# Patient Record
Sex: Male | Born: 1965
Health system: Southern US, Community
[De-identification: ages and names within clinical notes are randomized; demographics above are authoritative.]

## PROBLEM LIST (undated history)

## (undated) DIAGNOSIS — N400 Enlarged prostate without lower urinary tract symptoms: Secondary | ICD-10-CM

## (undated) DIAGNOSIS — E119 Type 2 diabetes mellitus without complications: Secondary | ICD-10-CM

## (undated) DIAGNOSIS — E785 Hyperlipidemia, unspecified: Secondary | ICD-10-CM

## (undated) DIAGNOSIS — I10 Essential (primary) hypertension: Secondary | ICD-10-CM

## (undated) DIAGNOSIS — I639 Cerebral infarction, unspecified: Secondary | ICD-10-CM

---

## 1997-09-27 ENCOUNTER — Emergency Department (HOSPITAL_COMMUNITY): Admission: EM | Admit: 1997-09-27 | Discharge: 1997-09-27 | Payer: Self-pay | Admitting: Emergency Medicine

## 2003-08-25 ENCOUNTER — Emergency Department (HOSPITAL_COMMUNITY): Admission: EM | Admit: 2003-08-25 | Discharge: 2003-08-26 | Payer: Self-pay | Admitting: Emergency Medicine

## 2003-09-14 ENCOUNTER — Emergency Department (HOSPITAL_COMMUNITY): Admission: EM | Admit: 2003-09-14 | Discharge: 2003-09-14 | Payer: Self-pay | Admitting: Emergency Medicine

## 2006-02-14 ENCOUNTER — Ambulatory Visit: Payer: Self-pay | Admitting: Nurse Practitioner

## 2006-02-17 ENCOUNTER — Ambulatory Visit: Payer: Self-pay | Admitting: *Deleted

## 2007-01-24 ENCOUNTER — Encounter (INDEPENDENT_AMBULATORY_CARE_PROVIDER_SITE_OTHER): Payer: Self-pay | Admitting: *Deleted

## 2008-11-03 ENCOUNTER — Observation Stay (HOSPITAL_COMMUNITY): Admission: EM | Admit: 2008-11-03 | Discharge: 2008-11-03 | Payer: Self-pay | Admitting: Emergency Medicine

## 2008-11-09 ENCOUNTER — Inpatient Hospital Stay (HOSPITAL_COMMUNITY): Admission: EM | Admit: 2008-11-09 | Discharge: 2008-11-14 | Payer: Self-pay | Admitting: Emergency Medicine

## 2008-12-05 ENCOUNTER — Ambulatory Visit: Payer: Self-pay | Admitting: Internal Medicine

## 2009-06-18 ENCOUNTER — Observation Stay (HOSPITAL_COMMUNITY): Admission: EM | Admit: 2009-06-18 | Discharge: 2009-06-18 | Payer: Self-pay | Admitting: Emergency Medicine

## 2009-06-20 ENCOUNTER — Emergency Department (HOSPITAL_COMMUNITY): Admission: EM | Admit: 2009-06-20 | Discharge: 2009-06-20 | Payer: Self-pay | Admitting: Emergency Medicine

## 2010-05-15 ENCOUNTER — Inpatient Hospital Stay (HOSPITAL_COMMUNITY)
Admission: EM | Admit: 2010-05-15 | Discharge: 2010-05-17 | Payer: Self-pay | Source: Home / Self Care | Attending: Internal Medicine | Admitting: Internal Medicine

## 2010-05-15 IMAGING — CR DG CHEST 2V
2 series · 2 of 2 positions shown · non-contrast
Comparison: None

CLINICAL DATA: high blood sugar

CHEST - 2 VIEW

[w chest pa]
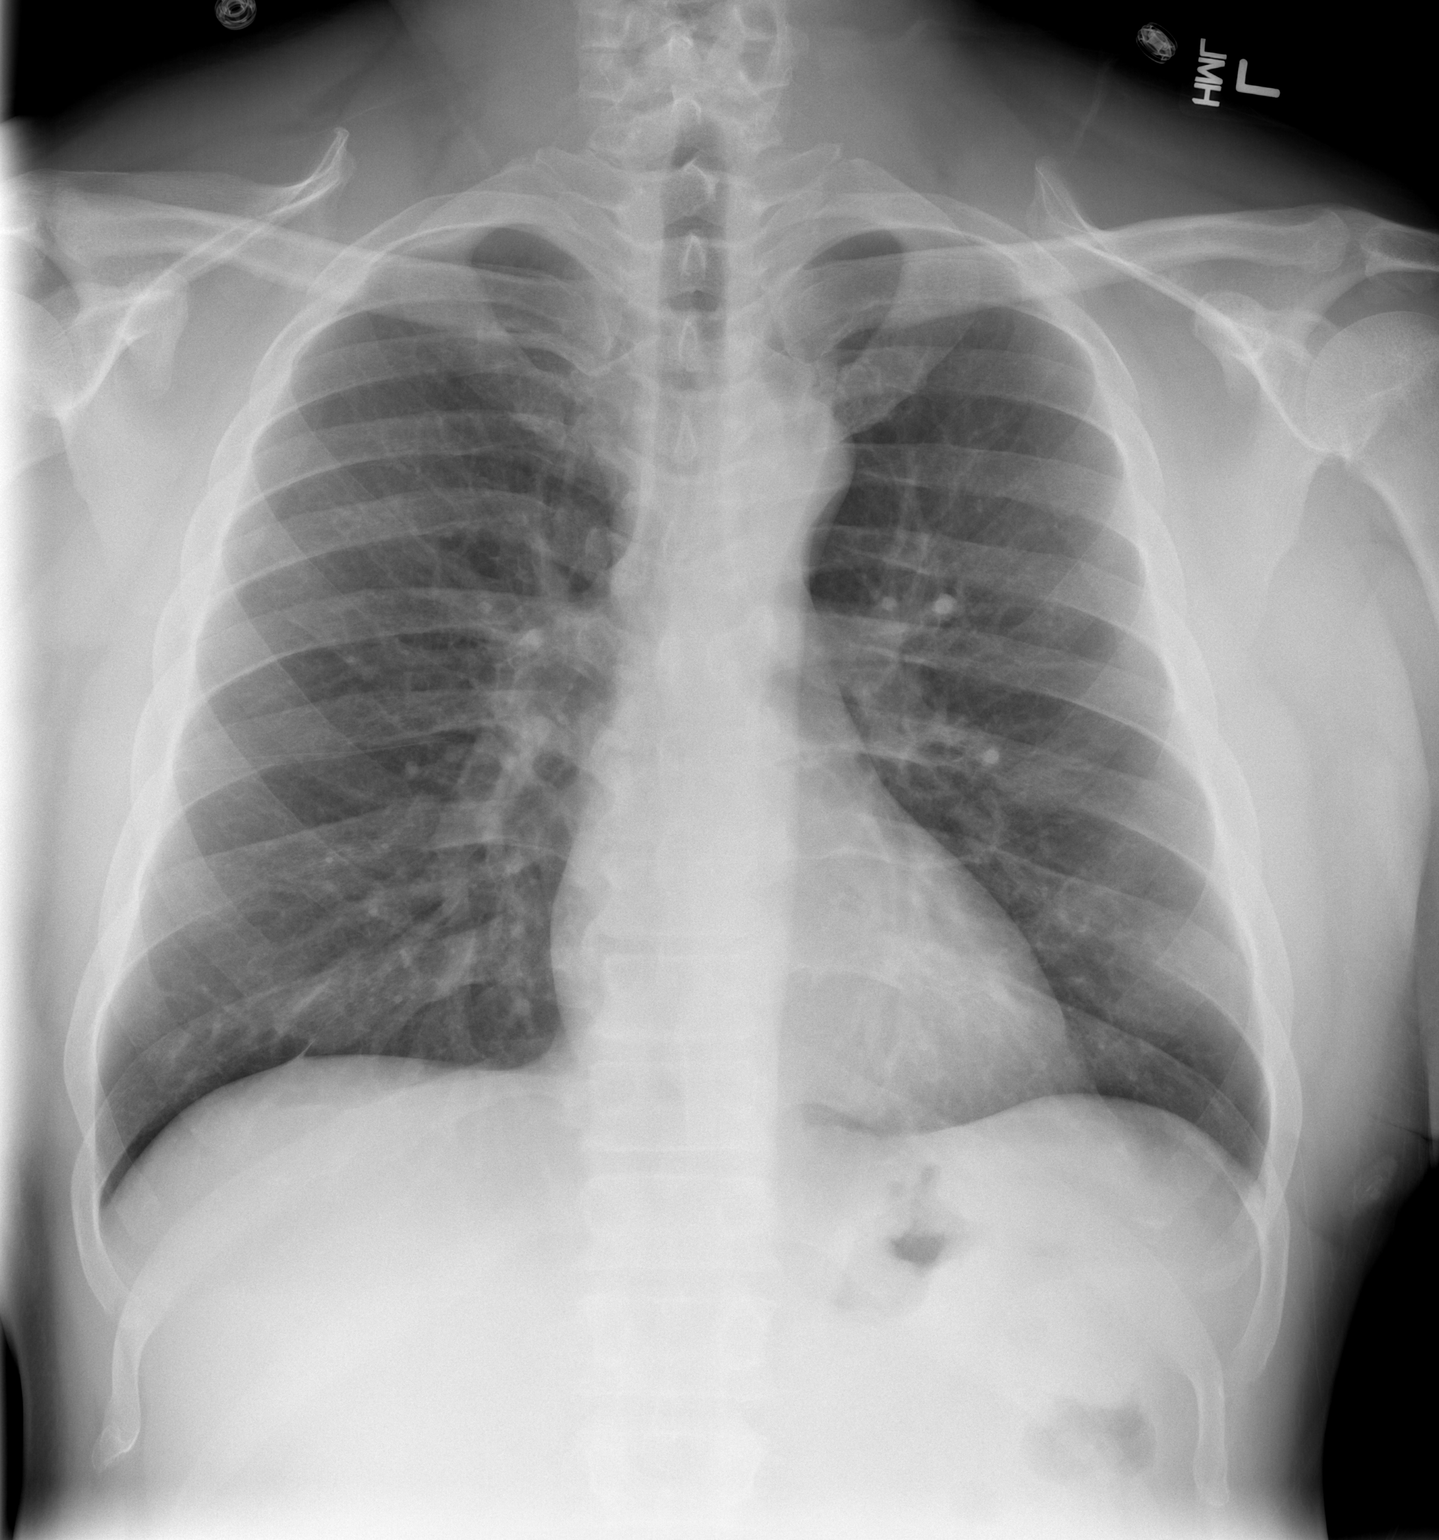

[w chest lat]
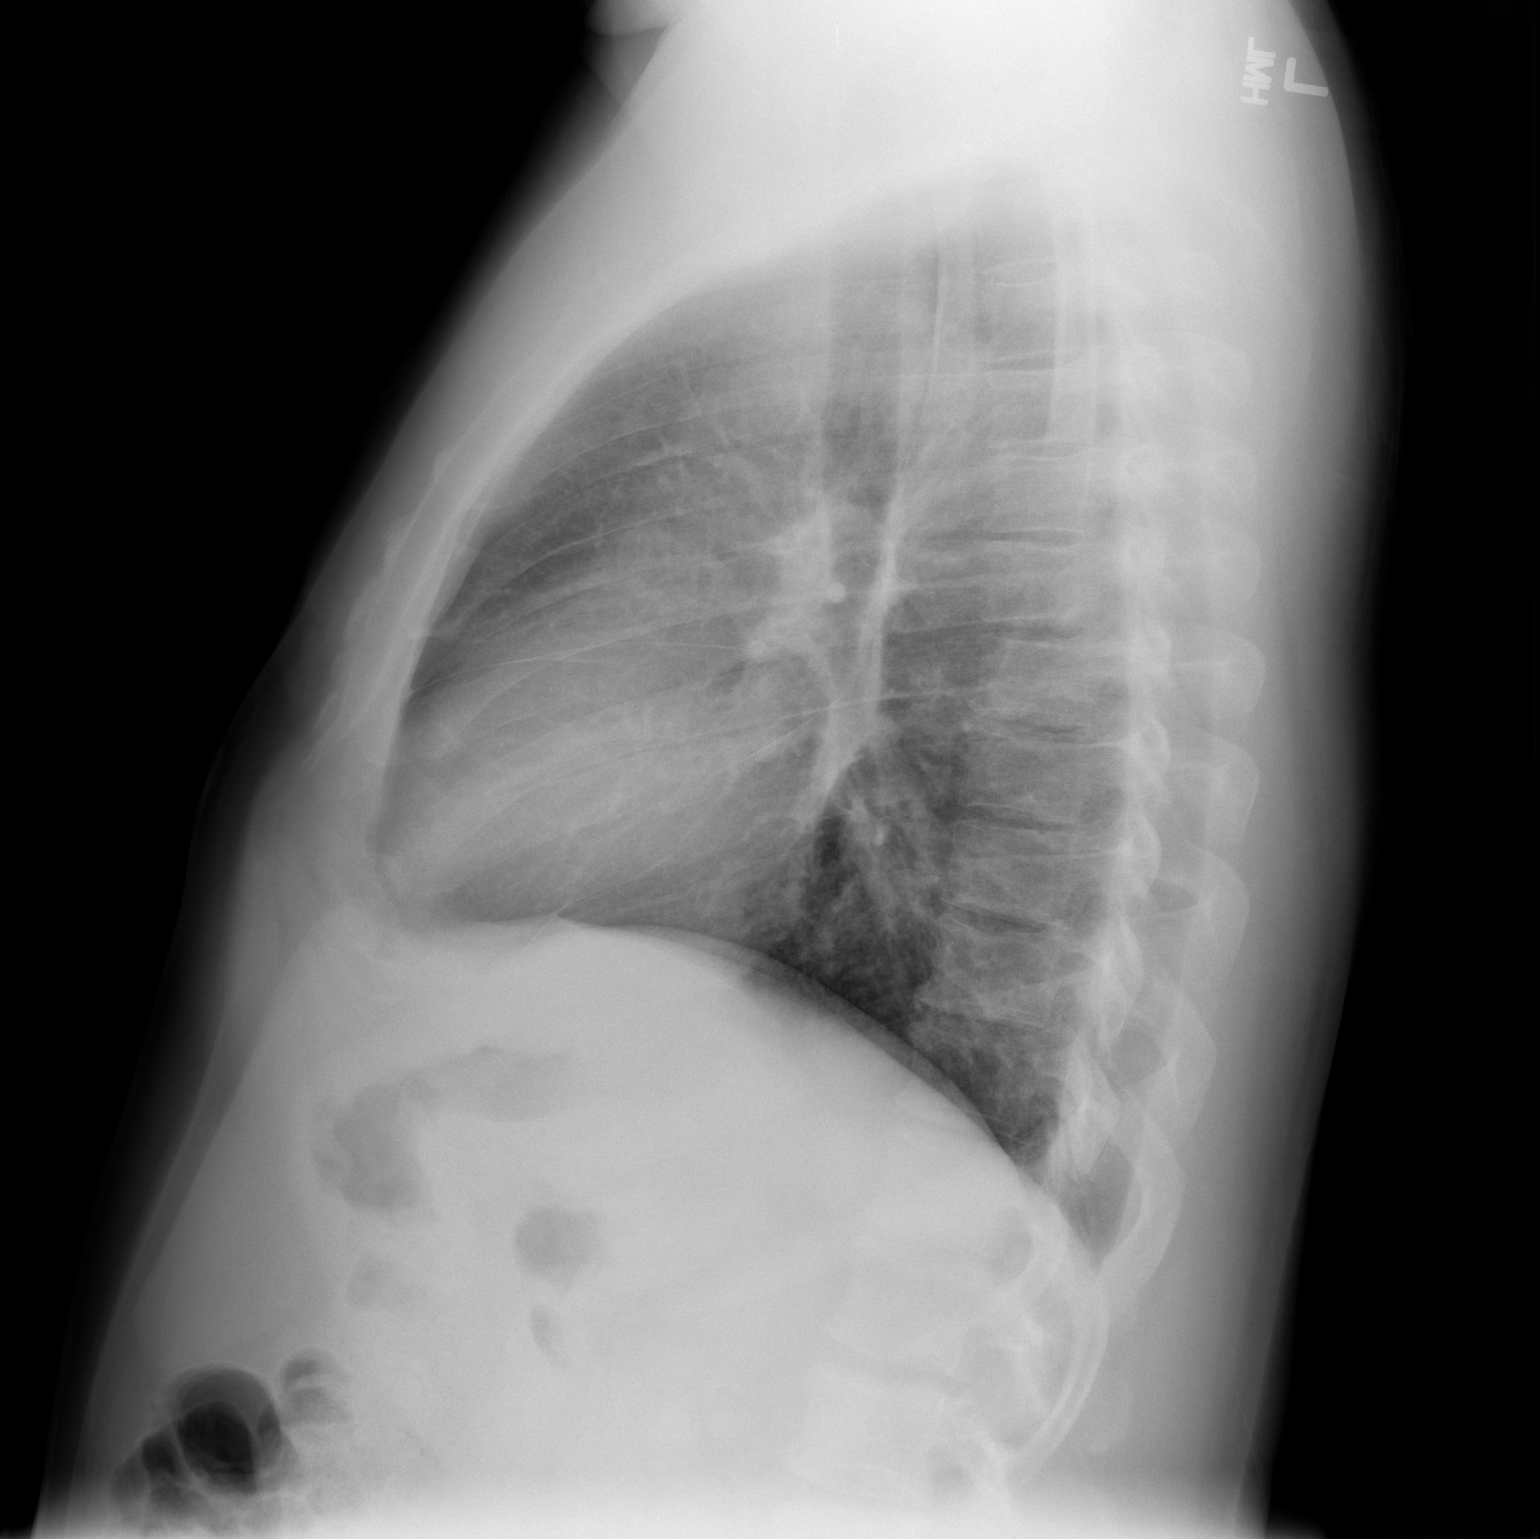

[2 of 2 positions shown; findings below may reference images not displayed]

FINDINGS: The heart size and mediastinal contours are within normal
limits.  Both lungs are clear.  The visualized skeletal structures
are unremarkable.
IMPRESSION: No active cardiopulmonary abnormalities.

## 2010-05-24 LAB — DIFFERENTIAL
Basophils Absolute: 0 10*3/uL (ref 0.0–0.1)
Basophils Absolute: 0 10*3/uL (ref 0.0–0.1)
Basophils Relative: 0 % (ref 0–1)
Basophils Relative: 0 % (ref 0–1)
Eosinophils Absolute: 0.1 10*3/uL (ref 0.0–0.7)
Eosinophils Absolute: 0.1 10*3/uL (ref 0.0–0.7)
Eosinophils Relative: 1 % (ref 0–5)
Eosinophils Relative: 2 % (ref 0–5)
Lymphocytes Relative: 28 % (ref 12–46)
Lymphocytes Relative: 60 % — ABNORMAL HIGH (ref 12–46)
Lymphs Abs: 1.8 10*3/uL (ref 0.7–4.0)
Lymphs Abs: 4.3 10*3/uL — ABNORMAL HIGH (ref 0.7–4.0)
Monocytes Absolute: 0.4 10*3/uL (ref 0.1–1.0)
Monocytes Absolute: 0.4 10*3/uL (ref 0.1–1.0)
Monocytes Relative: 6 % (ref 3–12)
Monocytes Relative: 5 % (ref 3–12)
Neutro Abs: 4.1 10*3/uL (ref 1.7–7.7)
Neutro Abs: 2.5 10*3/uL (ref 1.7–7.7)
Neutrophils Relative %: 65 % (ref 43–77)
Neutrophils Relative %: 34 % — ABNORMAL LOW (ref 43–77)

## 2010-05-24 LAB — URINALYSIS, ROUTINE W REFLEX MICROSCOPIC
Bilirubin Urine: NEGATIVE
Hgb urine dipstick: NEGATIVE
Ketones, ur: 15 mg/dL — AB
Leukocytes, UA: NEGATIVE
Nitrite: NEGATIVE
Protein, ur: NEGATIVE mg/dL
Specific Gravity, Urine: 1.037 — ABNORMAL HIGH (ref 1.005–1.030)
Urine Glucose, Fasting: >1000 mg/dL — AB
Urobilinogen, UA: 0.2 mg/dL (ref 0.0–1.0)
pH: 6 (ref 5.0–8.0)

## 2010-05-24 LAB — COMPREHENSIVE METABOLIC PANEL
ALT: 27 U/L (ref 0–53)
ALT: 24 U/L (ref 0–53)
AST: 19 U/L (ref 0–37)
AST: 23 U/L (ref 0–37)
Albumin: 3.3 g/dL — ABNORMAL LOW (ref 3.5–5.2)
Albumin: 2.6 g/dL — ABNORMAL LOW (ref 3.5–5.2)
Alkaline Phosphatase: 79 U/L (ref 39–117)
Alkaline Phosphatase: 59 U/L (ref 39–117)
BUN: 7 mg/dL (ref 6–23)
BUN: 8 mg/dL (ref 6–23)
CO2: 24 mEq/L (ref 19–32)
CO2: 28 mEq/L (ref 19–32)
Calcium: 8.5 mg/dL (ref 8.4–10.5)
Calcium: 8.8 mg/dL (ref 8.4–10.5)
Chloride: 97 mEq/L (ref 96–112)
Chloride: 107 mEq/L (ref 96–112)
Creatinine, Ser: 0.93 mg/dL (ref 0.4–1.5)
Creatinine, Ser: 0.77 mg/dL (ref 0.4–1.5)
GFR calc Af Amer: >60 mL/min (ref >60)
GFR calc Af Amer: >60 mL/min (ref >60)
GFR calc non Af Amer: >60 mL/min (ref >60)
GFR calc non Af Amer: >60 mL/min (ref >60)
Glucose, Bld: 723 mg/dL (ref 70–99)
Glucose, Bld: 161 mg/dL — ABNORMAL HIGH (ref 70–99)
Potassium: 4.1 mEq/L (ref 3.5–5.1)
Potassium: 3.6 mEq/L (ref 3.5–5.1)
Sodium: 129 mEq/L — ABNORMAL LOW (ref 135–145)
Sodium: 141 mEq/L (ref 135–145)
Total Bilirubin: 0.8 mg/dL (ref 0.3–1.2)
Total Bilirubin: 0.4 mg/dL (ref 0.3–1.2)
Total Protein: 6.1 g/dL (ref 6.0–8.3)
Total Protein: 5.2 g/dL — ABNORMAL LOW (ref 6.0–8.3)

## 2010-05-24 LAB — LIPID PANEL
Cholesterol: 131 mg/dL (ref 0–200)
HDL: 32 mg/dL — ABNORMAL LOW (ref >39)
LDL Cholesterol: 79 mg/dL (ref 0–99)
Total CHOL/HDL Ratio: 4.1 RATIO
Triglycerides: 102 mg/dL (ref <150)
VLDL: 20 mg/dL (ref 0–40)

## 2010-05-24 LAB — POCT I-STAT 3, VENOUS BLOOD GAS (G3P V)
Bicarbonate: 25 mEq/L — ABNORMAL HIGH (ref 20.0–24.0)
O2 Saturation: 81 %
TCO2: 26 mmol/L (ref 0–100)
pCO2, Ven: 42 mmHg — ABNORMAL LOW (ref 45.0–50.0)
pH, Ven: 7.382 — ABNORMAL HIGH (ref 7.250–7.300)
pO2, Ven: 46 mmHg — ABNORMAL HIGH (ref 30.0–45.0)

## 2010-05-24 LAB — URINE MICROSCOPIC-ADD ON

## 2010-05-24 LAB — GLUCOSE, CAPILLARY
Glucose-Capillary: 107 mg/dL — ABNORMAL HIGH (ref 70–99)
Glucose-Capillary: 128 mg/dL — ABNORMAL HIGH (ref 70–99)
Glucose-Capillary: 156 mg/dL — ABNORMAL HIGH (ref 70–99)
Glucose-Capillary: 182 mg/dL — ABNORMAL HIGH (ref 70–99)
Glucose-Capillary: 185 mg/dL — ABNORMAL HIGH (ref 70–99)
Glucose-Capillary: 302 mg/dL — ABNORMAL HIGH (ref 70–99)
Glucose-Capillary: 351 mg/dL — ABNORMAL HIGH (ref 70–99)
Glucose-Capillary: 361 mg/dL — ABNORMAL HIGH (ref 70–99)
Glucose-Capillary: 401 mg/dL — ABNORMAL HIGH (ref 70–99)
Glucose-Capillary: 475 mg/dL — ABNORMAL HIGH (ref 70–99)
Glucose-Capillary: 590 mg/dL (ref 70–99)
Glucose-Capillary: 591 mg/dL (ref 70–99)
Glucose-Capillary: 107 mg/dL — ABNORMAL HIGH (ref 70–99)
Glucose-Capillary: 113 mg/dL — ABNORMAL HIGH (ref 70–99)
Glucose-Capillary: 124 mg/dL — ABNORMAL HIGH (ref 70–99)
Glucose-Capillary: 132 mg/dL — ABNORMAL HIGH (ref 70–99)
Glucose-Capillary: 136 mg/dL — ABNORMAL HIGH (ref 70–99)
Glucose-Capillary: 139 mg/dL — ABNORMAL HIGH (ref 70–99)
Glucose-Capillary: 144 mg/dL — ABNORMAL HIGH (ref 70–99)
Glucose-Capillary: 151 mg/dL — ABNORMAL HIGH (ref 70–99)
Glucose-Capillary: 167 mg/dL — ABNORMAL HIGH (ref 70–99)
Glucose-Capillary: 181 mg/dL — ABNORMAL HIGH (ref 70–99)
Glucose-Capillary: 183 mg/dL — ABNORMAL HIGH (ref 70–99)
Glucose-Capillary: 219 mg/dL — ABNORMAL HIGH (ref 70–99)
Glucose-Capillary: 231 mg/dL — ABNORMAL HIGH (ref 70–99)
Glucose-Capillary: 263 mg/dL — ABNORMAL HIGH (ref 70–99)
Glucose-Capillary: 120 mg/dL — ABNORMAL HIGH (ref 70–99)
Glucose-Capillary: 254 mg/dL — ABNORMAL HIGH (ref 70–99)
Glucose-Capillary: 447 mg/dL — ABNORMAL HIGH (ref 70–99)

## 2010-05-24 LAB — CBC
HCT: 42.3 % (ref 39.0–52.0)
HCT: 39.8 % (ref 39.0–52.0)
Hemoglobin: 14 g/dL (ref 13.0–17.0)
Hemoglobin: 13.4 g/dL (ref 13.0–17.0)
MCH: 29.9 pg (ref 26.0–34.0)
MCH: 30.5 pg (ref 26.0–34.0)
MCHC: 33.1 g/dL (ref 30.0–36.0)
MCHC: 33.7 g/dL (ref 30.0–36.0)
MCV: 90.2 fL (ref 78.0–100.0)
MCV: 90.7 fL (ref 78.0–100.0)
Platelets: 231 10*3/uL (ref 150–400)
Platelets: 236 10*3/uL (ref 150–400)
RBC: 4.69 MIL/uL (ref 4.22–5.81)
RBC: 4.39 MIL/uL (ref 4.22–5.81)
RDW: 12.9 % (ref 11.5–15.5)
RDW: 12.8 % (ref 11.5–15.5)
WBC: 6.3 10*3/uL (ref 4.0–10.5)
WBC: 7.3 10*3/uL (ref 4.0–10.5)

## 2010-05-24 LAB — HEMOGLOBIN A1C
Hgb A1c MFr Bld: 16.6 % — ABNORMAL HIGH (ref <5.7)
Mean Plasma Glucose: 430 mg/dL — ABNORMAL HIGH (ref <117)

## 2010-07-04 ENCOUNTER — Observation Stay (HOSPITAL_COMMUNITY)
Admission: EM | Admit: 2010-07-04 | Discharge: 2010-07-04 | Disposition: A | Discharge status: Home / Self Care | Payer: Self-pay | Attending: Emergency Medicine | Admitting: Emergency Medicine

## 2010-07-04 DIAGNOSIS — E119 Type 2 diabetes mellitus without complications: Principal | ICD-10-CM | POA: Insufficient documentation

## 2010-07-04 DIAGNOSIS — Z5900 Homelessness unspecified: Secondary | ICD-10-CM | POA: Insufficient documentation

## 2010-07-04 DIAGNOSIS — R350 Frequency of micturition: Secondary | ICD-10-CM | POA: Insufficient documentation

## 2010-07-04 LAB — BASIC METABOLIC PANEL
BUN: 7 mg/dL (ref 6–23)
CO2: 31 mEq/L (ref 19–32)
Calcium: 8.8 mg/dL (ref 8.4–10.5)
Chloride: 94 mEq/L — ABNORMAL LOW (ref 96–112)
Creatinine, Ser: 0.87 mg/dL (ref 0.4–1.5)
GFR calc Af Amer: >60 mL/min (ref >60)
GFR calc non Af Amer: >60 mL/min (ref >60)
Glucose, Bld: 648 mg/dL (ref 70–99)
Potassium: 4.3 mEq/L (ref 3.5–5.1)
Sodium: 132 mEq/L — ABNORMAL LOW (ref 135–145)

## 2010-07-04 LAB — CBC
HCT: 43.5 % (ref 39.0–52.0)
Hemoglobin: 15.1 g/dL (ref 13.0–17.0)
MCH: 31.2 pg (ref 26.0–34.0)
MCHC: 34.7 g/dL (ref 30.0–36.0)
MCV: 89.9 fL (ref 78.0–100.0)
Platelets: 232 10*3/uL (ref 150–400)
RBC: 4.84 MIL/uL (ref 4.22–5.81)
RDW: 13 % (ref 11.5–15.5)
WBC: 5.8 10*3/uL (ref 4.0–10.5)

## 2010-07-04 LAB — URINALYSIS, ROUTINE W REFLEX MICROSCOPIC
Bilirubin Urine: NEGATIVE
Hgb urine dipstick: NEGATIVE
Ketones, ur: NEGATIVE mg/dL
Leukocytes, UA: NEGATIVE
Nitrite: NEGATIVE
Protein, ur: NEGATIVE mg/dL
Specific Gravity, Urine: 1.037 — ABNORMAL HIGH (ref 1.005–1.030)
Urine Glucose, Fasting: >1000 mg/dL — AB
Urobilinogen, UA: 0.2 mg/dL (ref 0.0–1.0)
pH: 6 (ref 5.0–8.0)

## 2010-07-04 LAB — GLUCOSE, CAPILLARY
Glucose-Capillary: 590 mg/dL (ref 70–99)
Glucose-Capillary: 357 mg/dL — ABNORMAL HIGH (ref 70–99)
Glucose-Capillary: 261 mg/dL — ABNORMAL HIGH (ref 70–99)
Glucose-Capillary: 414 mg/dL — ABNORMAL HIGH (ref 70–99)
Glucose-Capillary: 360 mg/dL — ABNORMAL HIGH (ref 70–99)
Glucose-Capillary: 213 mg/dL — ABNORMAL HIGH (ref 70–99)
Glucose-Capillary: 150 mg/dL — ABNORMAL HIGH (ref 70–99)
Glucose-Capillary: 142 mg/dL — ABNORMAL HIGH (ref 70–99)

## 2010-07-04 LAB — RAPID URINE DRUG SCREEN, HOSP PERFORMED
Amphetamines: NOT DETECTED
Barbiturates: NOT DETECTED
Benzodiazepines: NOT DETECTED
Cocaine: POSITIVE — AB
Opiates: NOT DETECTED
Tetrahydrocannabinol: NOT DETECTED

## 2010-07-04 LAB — DIFFERENTIAL
Basophils Absolute: 0 10*3/uL (ref 0.0–0.1)
Basophils Relative: 0 % (ref 0–1)
Eosinophils Absolute: 0.1 10*3/uL (ref 0.0–0.7)
Eosinophils Relative: 1 % (ref 0–5)
Lymphocytes Relative: 38 % (ref 12–46)
Lymphs Abs: 2.2 10*3/uL (ref 0.7–4.0)
Monocytes Absolute: 0.4 10*3/uL (ref 0.1–1.0)
Monocytes Relative: 7 % (ref 3–12)
Neutro Abs: 3.1 10*3/uL (ref 1.7–7.7)
Neutrophils Relative %: 53 % (ref 43–77)

## 2010-07-04 LAB — URINE MICROSCOPIC-ADD ON: Sperm, UA: NONE SEEN

## 2010-07-28 LAB — CBC
HCT: 44.5 % (ref 39.0–52.0)
Platelets: 220 10*3/uL (ref 150–400)
RDW: 13.3 % (ref 11.5–15.5)
WBC: 6.2 10*3/uL (ref 4.0–10.5)

## 2010-07-28 LAB — BASIC METABOLIC PANEL
BUN: 12 mg/dL (ref 6–23)
BUN: 17 mg/dL (ref 6–23)
CO2: 28 mEq/L (ref 19–32)
Calcium: 9.3 mg/dL (ref 8.4–10.5)
Chloride: 95 mEq/L — ABNORMAL LOW (ref 96–112)
Creatinine, Ser: 1.04 mg/dL (ref 0.4–1.5)
Creatinine, Ser: 1.23 mg/dL (ref 0.4–1.5)
GFR calc Af Amer: >60 mL/min (ref >60)
GFR calc non Af Amer: >60 mL/min (ref >60)
Glucose, Bld: 687 mg/dL (ref 70–99)
Potassium: 4.7 mEq/L (ref 3.5–5.1)
Potassium: 4.8 mEq/L (ref 3.5–5.1)

## 2010-07-28 LAB — URINE MICROSCOPIC-ADD ON

## 2010-07-28 LAB — GLUCOSE, CAPILLARY
Glucose-Capillary: 243 mg/dL — ABNORMAL HIGH (ref 70–99)
Glucose-Capillary: 309 mg/dL — ABNORMAL HIGH (ref 70–99)
Glucose-Capillary: 352 mg/dL — ABNORMAL HIGH (ref 70–99)
Glucose-Capillary: 376 mg/dL — ABNORMAL HIGH (ref 70–99)
Glucose-Capillary: 398 mg/dL — ABNORMAL HIGH (ref 70–99)
Glucose-Capillary: 434 mg/dL — ABNORMAL HIGH (ref 70–99)
Glucose-Capillary: 461 mg/dL — ABNORMAL HIGH (ref 70–99)

## 2010-07-28 LAB — DIFFERENTIAL
Basophils Absolute: 0 10*3/uL (ref 0.0–0.1)
Lymphocytes Relative: 43 % (ref 12–46)
Lymphs Abs: 2.8 10*3/uL (ref 0.7–4.0)
Neutro Abs: 3.1 10*3/uL (ref 1.7–7.7)
Neutrophils Relative %: 48 % (ref 43–77)

## 2010-07-28 LAB — POCT I-STAT 3, VENOUS BLOOD GAS (G3P V)
Bicarbonate: 27.4 mEq/L — ABNORMAL HIGH (ref 20.0–24.0)
Patient temperature: 98
TCO2: 29 mmol/L (ref 0–100)
pCO2, Ven: 43.7 mmHg — ABNORMAL LOW (ref 45.0–50.0)
pH, Ven: 7.403 — ABNORMAL HIGH (ref 7.250–7.300)

## 2010-07-28 LAB — URINALYSIS, ROUTINE W REFLEX MICROSCOPIC
Glucose, UA: >1000 mg/dL — AB
Ketones, ur: NEGATIVE mg/dL
Leukocytes, UA: NEGATIVE
Nitrite: NEGATIVE
Specific Gravity, Urine: <1.005 — ABNORMAL LOW (ref 1.005–1.030)
pH: 6 (ref 5.0–8.0)

## 2010-08-15 LAB — GLUCOSE, CAPILLARY
Glucose-Capillary: 160 mg/dL — ABNORMAL HIGH (ref 70–99)
Glucose-Capillary: 182 mg/dL — ABNORMAL HIGH (ref 70–99)
Glucose-Capillary: 185 mg/dL — ABNORMAL HIGH (ref 70–99)
Glucose-Capillary: 200 mg/dL — ABNORMAL HIGH (ref 70–99)
Glucose-Capillary: 231 mg/dL — ABNORMAL HIGH (ref 70–99)
Glucose-Capillary: 260 mg/dL — ABNORMAL HIGH (ref 70–99)
Glucose-Capillary: 278 mg/dL — ABNORMAL HIGH (ref 70–99)
Glucose-Capillary: 293 mg/dL — ABNORMAL HIGH (ref 70–99)
Glucose-Capillary: 313 mg/dL — ABNORMAL HIGH (ref 70–99)
Glucose-Capillary: 329 mg/dL — ABNORMAL HIGH (ref 70–99)
Glucose-Capillary: 526 mg/dL (ref 70–99)

## 2010-08-15 LAB — RAPID URINE DRUG SCREEN, HOSP PERFORMED
Amphetamines: NOT DETECTED
Benzodiazepines: NOT DETECTED
Cocaine: POSITIVE — AB
Opiates: NOT DETECTED
Tetrahydrocannabinol: NOT DETECTED

## 2010-08-15 LAB — CBC
HCT: 42.8 % (ref 39.0–52.0)
HCT: 44.6 % (ref 39.0–52.0)
Hemoglobin: 14.7 g/dL (ref 13.0–17.0)
MCHC: 34.6 g/dL (ref 30.0–36.0)
MCHC: 35.3 g/dL (ref 30.0–36.0)
MCV: 90.9 fL (ref 78.0–100.0)
Platelets: 211 10*3/uL (ref 150–400)
RBC: 4.57 MIL/uL (ref 4.22–5.81)
RBC: 4.71 MIL/uL (ref 4.22–5.81)
RBC: 4.81 MIL/uL (ref 4.22–5.81)
WBC: 6.6 10*3/uL (ref 4.0–10.5)

## 2010-08-15 LAB — POCT I-STAT, CHEM 8
Calcium, Ion: 1.08 mmol/L — ABNORMAL LOW (ref 1.12–1.32)
Glucose, Bld: >700 mg/dL (ref 70–99)
HCT: 48 % (ref 39.0–52.0)
Hemoglobin: 16.3 g/dL (ref 13.0–17.0)

## 2010-08-15 LAB — URINALYSIS, ROUTINE W REFLEX MICROSCOPIC
Bilirubin Urine: NEGATIVE
Hgb urine dipstick: NEGATIVE
Ketones, ur: NEGATIVE mg/dL
Protein, ur: NEGATIVE mg/dL
Urobilinogen, UA: 0.2 mg/dL (ref 0.0–1.0)

## 2010-08-15 LAB — CK TOTAL AND CKMB (NOT AT ARMC)
CK, MB: 2.3 ng/mL (ref 0.3–4.0)
Total CK: 339 U/L — ABNORMAL HIGH (ref 7–232)

## 2010-08-15 LAB — CARDIAC PANEL(CRET KIN+CKTOT+MB+TROPI)
CK, MB: 1.4 ng/mL (ref 0.3–4.0)
CK, MB: 1.5 ng/mL (ref 0.3–4.0)
CK, MB: 1.7 ng/mL (ref 0.3–4.0)
Relative Index: 0.7 (ref 0.0–2.5)
Total CK: 156 U/L (ref 7–232)
Total CK: 168 U/L (ref 7–232)
Troponin I: 0.01 ng/mL (ref 0.00–0.06)
Troponin I: 0.01 ng/mL (ref 0.00–0.06)

## 2010-08-15 LAB — KETONES, QUALITATIVE

## 2010-08-15 LAB — BASIC METABOLIC PANEL
BUN: 10 mg/dL (ref 6–23)
CO2: 25 mEq/L (ref 19–32)
CO2: 31 mEq/L (ref 19–32)
CO2: 31 mEq/L (ref 19–32)
Calcium: 8.7 mg/dL (ref 8.4–10.5)
Calcium: 9.2 mg/dL (ref 8.4–10.5)
Chloride: 104 mEq/L (ref 96–112)
Creatinine, Ser: 0.9 mg/dL (ref 0.4–1.5)
Creatinine, Ser: 0.92 mg/dL (ref 0.4–1.5)
GFR calc Af Amer: >60 mL/min (ref >60)
GFR calc Af Amer: >60 mL/min (ref >60)
Glucose, Bld: 225 mg/dL — ABNORMAL HIGH (ref 70–99)
Glucose, Bld: 329 mg/dL — ABNORMAL HIGH (ref 70–99)
Sodium: 139 mEq/L (ref 135–145)

## 2010-08-15 LAB — COMPREHENSIVE METABOLIC PANEL
AST: 29 U/L (ref 0–37)
Albumin: 3.5 g/dL (ref 3.5–5.2)
Alkaline Phosphatase: 96 U/L (ref 39–117)
BUN: 12 mg/dL (ref 6–23)
CO2: 25 mEq/L (ref 19–32)
Chloride: 96 mEq/L (ref 96–112)
GFR calc non Af Amer: >60 mL/min (ref >60)
Potassium: 4.3 mEq/L (ref 3.5–5.1)
Total Bilirubin: 0.8 mg/dL (ref 0.3–1.2)

## 2010-08-15 LAB — DIFFERENTIAL
Basophils Absolute: 0 10*3/uL (ref 0.0–0.1)
Eosinophils Absolute: 0.1 10*3/uL (ref 0.0–0.7)
Lymphocytes Relative: 32 % (ref 12–46)
Lymphs Abs: 2.1 10*3/uL (ref 0.7–4.0)
Neutro Abs: 4.1 10*3/uL (ref 1.7–7.7)

## 2010-08-15 LAB — LIPID PANEL: VLDL: 31 mg/dL (ref 0–40)

## 2010-08-15 LAB — TROPONIN I: Troponin I: 0.01 ng/mL (ref 0.00–0.06)

## 2010-08-16 LAB — URINALYSIS, ROUTINE W REFLEX MICROSCOPIC
Glucose, UA: >1000 mg/dL — AB
Ketones, ur: NEGATIVE mg/dL
Leukocytes, UA: NEGATIVE
Nitrite: NEGATIVE
Protein, ur: NEGATIVE mg/dL
Urobilinogen, UA: 0.2 mg/dL (ref 0.0–1.0)

## 2010-08-16 LAB — DIFFERENTIAL
Basophils Absolute: 0.2 10*3/uL — ABNORMAL HIGH (ref 0.0–0.1)
Basophils Relative: 2 % — ABNORMAL HIGH (ref 0–1)
Eosinophils Absolute: 0.1 10*3/uL (ref 0.0–0.7)
Eosinophils Relative: 1 % (ref 0–5)
Lymphocytes Relative: 39 % (ref 12–46)
Lymphs Abs: 3.2 10*3/uL (ref 0.7–4.0)
Monocytes Absolute: 0.5 10*3/uL (ref 0.1–1.0)
Monocytes Relative: 6 % (ref 3–12)
Neutro Abs: 4.2 10*3/uL (ref 1.7–7.7)
Neutrophils Relative %: 52 % (ref 43–77)

## 2010-08-16 LAB — GLUCOSE, CAPILLARY
Glucose-Capillary: 274 mg/dL — ABNORMAL HIGH (ref 70–99)
Glucose-Capillary: 319 mg/dL — ABNORMAL HIGH (ref 70–99)
Glucose-Capillary: 443 mg/dL — ABNORMAL HIGH (ref 70–99)
Glucose-Capillary: 517 mg/dL (ref 70–99)
Glucose-Capillary: >600 mg/dL (ref 70–99)

## 2010-08-16 LAB — BASIC METABOLIC PANEL
BUN: 17 mg/dL (ref 6–23)
CO2: 26 mEq/L (ref 19–32)
Calcium: 9.5 mg/dL (ref 8.4–10.5)
Chloride: 99 mEq/L (ref 96–112)
Creatinine, Ser: 1.05 mg/dL (ref 0.4–1.5)
GFR calc Af Amer: >60 mL/min (ref >60)
GFR calc non Af Amer: >60 mL/min (ref >60)
Glucose, Bld: 579 mg/dL (ref 70–99)
Potassium: 4.2 mEq/L (ref 3.5–5.1)
Sodium: 133 mEq/L — ABNORMAL LOW (ref 135–145)

## 2010-08-16 LAB — CBC
HCT: 46.7 % (ref 39.0–52.0)
Hemoglobin: 16.4 g/dL (ref 13.0–17.0)
MCHC: 35 g/dL (ref 30.0–36.0)
MCV: 91.4 fL (ref 78.0–100.0)
Platelets: 234 10*3/uL (ref 150–400)
RBC: 5.11 MIL/uL (ref 4.22–5.81)
RDW: 12.7 % (ref 11.5–15.5)
WBC: 8.1 10*3/uL (ref 4.0–10.5)

## 2010-08-16 LAB — URINE MICROSCOPIC-ADD ON

## 2010-09-21 NOTE — H&P (Signed)
NAME:  Ryan Hughes, Ryan Hughes NO.:  [id number]   MEDICAL RECORD NO.:  [id number]          PATIENT TYPE:  INP   LOCATION:  1827                         FACILITY:  [facility]   PHYSICIAN:  Eduard Clos, MDDATE OF BIRTH:  [birth year]   DATE OF ADMISSION:  11/09/2008  DATE OF DISCHARGE:                              HISTORY & PHYSICAL   PATIENT'S PRIMARY CARE PHYSICIAN:  Unassigned.   CHIEF COMPLAINT:  Increasing urination and increasing thirst.   HISTORY OF PRESENTING ILLNESS:  45 year old male who was just recently  diagnosed with diabetes mellitus type 2 four days ago was discharged  with metformin from the ER, came back with polydipsia, polyuria over the  last 2 or 3 days.  The patient also was feeling very fatigued.  In the  ER the patient was found to have a blood sugar of around more than 700  with anion gap around 6.  The patient at this time has been admitted for  further evaluation and management.  At this time the patient's blood  sugar is  around 340.  The patient has just received  two times 10 units  of regular insulin.  At this time he will be started on Lantus insulin  with CBG coverage.   The patient denies any chest pain, shortness of breath.  Denies any  abdominal pain, nausea, vomiting, diarrhea, fever, chills, headache,  loss of consciousness.  The patient has intense fatigue.  Denies any  focal deficit or headache.  The patient's polyuria and polydipsia has  been there for the last few days.   PAST MEDICAL HISTORY:  Newly-diagnosed diabetes mellitus, probably type  2, three or four days ago.   PAST SURGICAL HISTORY:  Rectal abscess incision and drainage.   MEDICATIONS PRIOR TO ADMISSION:  Was started on metformin 500 mg p.o.  b.i.d. 3 days ago.   ALLERGIES:  No known drug allergies.   FAMILY HISTORY:  Significant for mother having diabetes mellitus type 2.   SOCIAL HISTORY:  The patient smokes cigarettes.  Has been strongly  advised to quit  smoking.  Denies any alcohol or drug abuse.   REVIEW OF SYSTEMS:  As per history of presenting illness.  Nothing else  significant.   PHYSICAL EXAMINATION:  The patient examined at bedside, not in acute  distress.  VITAL SIGNS:  Blood pressure is 117/67, pulse 84 per minute, temperature  98.3, respirations 18 per minute, O2 saturation 98%.  HEENT:  Anicteric.  No pallor.  CHEST:  Bilateral air entry present.  No rhonchi, no crepitation.  HEART:  S1, S2 heard.  ABDOMEN:  Soft, nontender.  Bowel sounds heard.  CNS: Alert and oriented to time, place and person.  Moves upper and  lower extremities 5/5.  EXTREMITIES:  Peripheral pulses felt.  No edema.   LABORATORIES:  CBC:  WBC 6.6, hemoglobin 16.3, hematocrit 48, platelets  211, neutrophils 63%.  Blood sugars:  CBGs 0.186 and 313 now.  Complete  metabolic panel:  Sodium 127, potassium 4.4, chloride 96, carbon dioxide  25, glucose more than 700, BUN 14, creatinine 1, total bilirubin  0.8,  anion gap is 6.  AST 29, ALT 46, alkaline phosphate 96, calcium 9.1.  Ketones negative in the urine.  LE and nitrites are negative.   ASSESSMENT:  1. Severe uncontrolled diabetes mellitus type 2,  nonketotic.  2. Severe dehydration.  3. Tobacco abuse.  4. Obesity.   PLAN:  1. We will admit the patient to telemetry.  2. We will check for cardiac enzymes and electrocardiogram.  Adjusted      oral a.c. as the patient was having intense fatigue probably from      dehydration.  3. Will continue with IV fluids.  At this time the patient's blood      sugar has been 313.  We will start the patient on Lantus insulin      and keep on CBG every 4 and in the morning we will change it to      before breakfast at bedtime.  4. Will need diabetic education.  5. The patient strongly advised to quit smoking.  6. The patient will need a followup physician at time of discharge.  7. The patient will also be checked for fasting lipid panel and      hemoglobin  A1c.      Eduard Clos, MD  Electronically Signed     Eduard Clos, MD  Electronically Signed    ANK/MEDQ  D:  11/09/2008  T:  11/09/2008  Job:  [id number]

## 2010-09-21 NOTE — Discharge Summary (Signed)
NAME:  DEMARIE, HYNEMAN NO.:  [id number]   MEDICAL RECORD NO.:  [id number]          PATIENT TYPE:  INP   LOCATION:  5528                         FACILITY:  [facility]   PHYSICIAN:  Theodosia Paling, MD    DATE OF BIRTH:  February 03, 1966   DATE OF ADMISSION:  11/09/2008  DATE OF DISCHARGE:  11/14/2008                               DISCHARGE SUMMARY   PRIMARY CARE PHYSICIAN:  The patient does not have a PCP at this time.   ADMITTING HISTORY:  Please refer to the excellent admission note  dictated by Midge Minium, MD under history of present illness.   DISCHARGE DIAGNOSIS:  Hyperglycemia.   DISCHARGE MEDICATIONS:  The patient was recommended to take metformin 1  gram p.o. q.12 h and glipizide 10 mg p.o. daily.  However, the patient  left AMA.   HOSPITAL COURSE:  The following issues were addressed during the  hospitalization:  New-onset diabetes mellitus.  The patient was started  on metformin and glipizide.  He also needed NPH insulin.  However, the  patient did not even have a refrigerator to keep the insulin in cold  place.  He had to go to his friend's house.  Therefore, the attempt was  to control his diabetes with p.o. medications.  However, the patient had  some financial issues due to which he left in the morning.   DISPOSITION:  The patient left AMA.   Total time spent on discharging  this patient 40 minutes.  The patient  does not have a primary care physician to whom this discharge summary  can be forwarded to.   PROCEDURES AND CONSULTATIONS PERFORMED:  None.   IMAGING PERFORMED:  None.      Theodosia Paling, MD  Electronically Signed     Theodosia Paling, MD  Electronically Signed    NP/MEDQ  D:  11/14/2008  T:  11/14/2008  Job:  [phone]

## 2011-06-18 DIAGNOSIS — R202 Paresthesia of skin: Secondary | ICD-10-CM | POA: Insufficient documentation

## 2011-06-18 DIAGNOSIS — E119 Type 2 diabetes mellitus without complications: Secondary | ICD-10-CM | POA: Insufficient documentation

## 2011-07-18 DIAGNOSIS — M79673 Pain in unspecified foot: Secondary | ICD-10-CM | POA: Insufficient documentation

## 2012-05-16 DIAGNOSIS — M62830 Muscle spasm of back: Secondary | ICD-10-CM | POA: Insufficient documentation

## 2013-01-03 ENCOUNTER — Emergency Department (HOSPITAL_COMMUNITY)
Admission: EM | Admit: 2013-01-03 | Discharge: 2013-01-03 | Disposition: A | Discharge status: Home / Self Care | Payer: No Typology Code available for payment source | Attending: Emergency Medicine | Admitting: Emergency Medicine

## 2013-01-03 ENCOUNTER — Emergency Department (HOSPITAL_COMMUNITY): Payer: No Typology Code available for payment source

## 2013-01-03 ENCOUNTER — Encounter (HOSPITAL_COMMUNITY): Payer: Self-pay | Admitting: *Deleted

## 2013-01-03 DIAGNOSIS — Z794 Long term (current) use of insulin: Secondary | ICD-10-CM | POA: Insufficient documentation

## 2013-01-03 DIAGNOSIS — M545 Low back pain: Secondary | ICD-10-CM

## 2013-01-03 DIAGNOSIS — Y92410 Unspecified street and highway as the place of occurrence of the external cause: Secondary | ICD-10-CM | POA: Insufficient documentation

## 2013-01-03 DIAGNOSIS — Y9389 Activity, other specified: Secondary | ICD-10-CM | POA: Insufficient documentation

## 2013-01-03 DIAGNOSIS — IMO0002 Reserved for concepts with insufficient information to code with codable children: Secondary | ICD-10-CM | POA: Insufficient documentation

## 2013-01-03 HISTORY — DX: Type 2 diabetes mellitus without complications: E11.9
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
## 2013-01-03 MED ORDER — OXYCODONE-ACETAMINOPHEN 5-325 MG PO TABS: 1.0000 | ORAL_TABLET | Freq: Four times a day (QID) | ORAL | Status: DC | PRN

## 2013-01-03 MED ORDER — OXYCODONE-ACETAMINOPHEN 5-325 MG PO TABS
1.0000 | ORAL_TABLET | Freq: Once | ORAL | Status: AC
  Administered 2013-01-03: 1 via ORAL
  Filled 2013-01-03: qty 1

## 2013-01-03 MED ORDER — ACETAMINOPHEN 325 MG PO TABS
650.0000 mg | ORAL_TABLET | Freq: Once | ORAL | Status: DC
  Filled 2013-01-03: qty 2

## 2013-01-03 NOTE — ED Notes (Signed)
LSB and head blocks removed by RN and 3 NTs

## 2013-01-03 NOTE — ED Notes (Signed)
c-collar removed by MD Romeo Apple
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
## 2013-01-03 NOTE — Progress Notes (Signed)
[facility] CL provided patient with a list of primary care resources in [place]. Patient stated that he lives in [place].
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
## 2013-03-19 DIAGNOSIS — R21 Rash and other nonspecific skin eruption: Secondary | ICD-10-CM | POA: Insufficient documentation

## 2013-03-19 DIAGNOSIS — R3911 Hesitancy of micturition: Secondary | ICD-10-CM | POA: Insufficient documentation

## 2017-12-06 ENCOUNTER — Encounter (HOSPITAL_COMMUNITY): Payer: Self-pay

## 2017-12-06 ENCOUNTER — Ambulatory Visit (HOSPITAL_COMMUNITY)
Admission: EM | Admit: 2017-12-06 | Discharge: 2017-12-06 | Disposition: A | Discharge status: Home / Self Care | Payer: Self-pay | Attending: Internal Medicine | Admitting: Internal Medicine

## 2017-12-06 DIAGNOSIS — K047 Periapical abscess without sinus: Secondary | ICD-10-CM

## 2017-12-06 DIAGNOSIS — K0889 Other specified disorders of teeth and supporting structures: Secondary | ICD-10-CM

## 2017-12-06 DIAGNOSIS — K029 Dental caries, unspecified: Secondary | ICD-10-CM

## 2017-12-06 MED ORDER — IBUPROFEN 800 MG PO TABS
800.0000 mg | ORAL_TABLET | Freq: Once | ORAL | Status: AC
  Administered 2017-12-06: 800 mg via ORAL

## 2017-12-06 MED ORDER — AMOXICILLIN-POT CLAVULANATE 875-125 MG PO TABS: 1.0000 | ORAL_TABLET | Freq: Two times a day (BID) | ORAL | 0 refills | Status: DC

## 2017-12-06 MED ORDER — NAPROXEN 500 MG PO TABS: 500.0000 mg | ORAL_TABLET | Freq: Two times a day (BID) | ORAL | 0 refills | Status: DC

## 2017-12-06 MED ORDER — IBUPROFEN 800 MG PO TABS
ORAL_TABLET | ORAL | Status: AC
  Filled 2017-12-06: qty 1

## 2017-12-06 MED ORDER — HYDROCODONE-ACETAMINOPHEN 5-325 MG PO TABS: 1.0000 | ORAL_TABLET | Freq: Every day | ORAL | 0 refills | Status: DC

## 2017-12-06 NOTE — ED Provider Notes (Signed)
[facility]   [id number] 12/06/17 Arrival Time: 1755  SUBJECTIVE:  Ryan Hughes is a 52 y.o. male who reports abrupt onset of right lower dental pain described as constant. Present for 2 weeks. Denies a precipitating event or trauma.  Afebrile. Tolerating PO intake but reports pain with chewing. Normal swallowing. He does not see a dentist regularly. No neck swelling or pain. OTC analgesics without relief. Similar symptoms 8-9 years ago had to have tooth pulled.  Denies fever, chills, nausea, vomiting, dysphagia, difficulty swallowing liquids or tolerating own secretions.    ROS: As per HPI.  Past Medical History:  Diagnosis Date  . Diabetes mellitus without complication (HCC)    History reviewed. No pertinent surgical history. No Known Allergies No current facility-administered medications on file prior to encounter.    Current Outpatient Medications on File Prior to Encounter  Medication Sig Dispense Refill  . insulin NPH (HUMULIN N,NOVOLIN N) 100 UNIT/ML injection Inject 25 Units into the skin 2 (two) times daily.    . insulin regular (NOVOLIN R,HUMULIN R) 100 units/mL injection Inject 25 Units into the skin 3 (three) times daily before meals.    Marland Kitchen. oxyCODONE-acetaminophen (PERCOCET) 5-325 MG per tablet Take 1 tablet by mouth every 6 (six) hours as needed for pain. 15 tablet 0   Social History   Socioeconomic History  . Marital status: Single    Spouse name: Not on file  . Number of children: Not on file  . Years of education: Not on file  . Highest education level: Not on file  Occupational History  . Not on file  Social Needs  . Financial resource strain: Not on file  . Food insecurity:    Worry: Not on file    Inability: Not on file  . Transportation needs:    Medical: Not on file    Non-medical: Not on file  Tobacco Use  . Smoking status: Current Every Day Smoker    Packs/day: 1.00    Types: Cigarettes  Substance and Sexual Activity  . Alcohol use:  Yes  . Drug use: Yes    Types: Marijuana  . Sexual activity: Not on file  Lifestyle  . Physical activity:    Days per week: Not on file    Minutes per session: Not on file  . Stress: Not on file  Relationships  . Social connections:    Talks on phone: Not on file    Gets together: Not on file    Attends religious service: Not on file    Active member of club or organization: Not on file    Attends meetings of clubs or organizations: Not on file    Relationship status: Not on file  . Intimate partner violence:    Fear of current or ex partner: Not on file    Emotionally abused: Not on file    Physically abused: Not on file    Forced sexual activity: Not on file  Other Topics Concern  . Not on file  Social History Narrative  . Not on file   History reviewed. No pertinent family history.  OBJECTIVE:  Vitals:   12/06/17 1844  BP: (!) 152/78  Pulse: 90  Resp: 20  Temp: 98.8 F (37.1 C)  TempSrc: Temporal  SpO2: 97%    General appearance: alert; no distress; appears uncomfortable HENT: normocephalic; atraumatic; dentition: multiple carries; swollen over right lower gums without areas of fluctuance Neck: supple without LAD Lungs: normal respirations Skin: warm and dry Psychological:  alert and cooperative; normal mood and affect  ASSESSMENT & PLAN:  1. Pain, dental   2. Infected dental caries     Meds ordered this encounter  Medications  . ibuprofen (ADVIL,MOTRIN) tablet 800 mg  . HYDROcodone-acetaminophen (NORCO/VICODIN) 5-325 MG tablet    Sig: Take 1-2 tablets by mouth at bedtime.    Dispense:  10 tablet    Refill:  0    Order Specific Question:   Supervising Provider    Answer:   Isa Rankin [phone]  . naproxen (NAPROSYN) 500 MG tablet    Sig: Take 1 tablet (500 mg total) by mouth 2 (two) times daily.    Dispense:  30 tablet    Refill:  0    Order Specific Question:   Supervising Provider    Answer:   Isa Rankin [phone]  .  amoxicillin-clavulanate (AUGMENTIN) 875-125 MG tablet    Sig: Take 1 tablet by mouth every 12 (twelve) hours.    Dispense:  14 tablet    Refill:  0    Order Specific Question:   Supervising Provider    Answer:   Isa Rankin [[id number]]    Given ibuprofen in office Prescribed naproxen.  Take as needed for pain Norco prescribed.  Use as needed for break through pain or at bedtime.   Prescribed augmentin.  Take as prescribed and to completion.   Maintain oral  hygiene and soft diet until evaluated by dentist Follow up with dentist as soon as possible for further evaluation and treatment (dental resource hand-out attached)  Reviewed expectations re: course of current medical issues. Questions answered. Outlined signs and symptoms indicating need for more acute intervention. Patient verbalized understanding. After Visit Summary given.   Rennis Harding, PA-C 12/06/17 1952

## 2017-12-06 NOTE — ED Triage Notes (Signed)
Pt presents with dental pain on right bottom side

## 2017-12-06 NOTE — Discharge Instructions (Addendum)
Given ibuprofen in office Prescribed naproxen.  Take as needed for pain Norco prescribed.  Use as needed for break through pain or at bedtime.   Maintain oral  hygiene and soft diet until evaluated by dentist Follow up with dentist as soon as possible for further evaluation and treatment (dental resource hand-out attached)

## 2018-02-05 ENCOUNTER — Emergency Department (HOSPITAL_COMMUNITY): Payer: Self-pay

## 2018-02-05 ENCOUNTER — Other Ambulatory Visit: Payer: Self-pay

## 2018-02-05 ENCOUNTER — Encounter (HOSPITAL_COMMUNITY): Payer: Self-pay | Admitting: Emergency Medicine

## 2018-02-05 ENCOUNTER — Emergency Department (HOSPITAL_COMMUNITY)
Admission: EM | Admit: 2018-02-05 | Discharge: 2018-02-05 | Disposition: A | Discharge status: Home / Self Care | Payer: Self-pay | Attending: Emergency Medicine | Admitting: Emergency Medicine

## 2018-02-05 DIAGNOSIS — N492 Inflammatory disorders of scrotum: Secondary | ICD-10-CM | POA: Insufficient documentation

## 2018-02-05 DIAGNOSIS — F17210 Nicotine dependence, cigarettes, uncomplicated: Secondary | ICD-10-CM | POA: Insufficient documentation

## 2018-02-05 DIAGNOSIS — E119 Type 2 diabetes mellitus without complications: Secondary | ICD-10-CM | POA: Insufficient documentation

## 2018-02-05 DIAGNOSIS — Z794 Long term (current) use of insulin: Secondary | ICD-10-CM | POA: Insufficient documentation

## 2018-02-05 DIAGNOSIS — Z79899 Other long term (current) drug therapy: Secondary | ICD-10-CM | POA: Insufficient documentation

## 2018-02-05 LAB — COMPREHENSIVE METABOLIC PANEL
ALK PHOS: 89 U/L (ref 38–126)
ALT: 20 U/L (ref 0–44)
AST: 19 U/L (ref 15–41)
Albumin: 3.9 g/dL (ref 3.5–5.0)
Anion gap: 10 (ref 5–15)
BILIRUBIN TOTAL: 0.6 mg/dL (ref 0.3–1.2)
BUN: 10 mg/dL (ref 6–20)
CALCIUM: 9.4 mg/dL (ref 8.9–10.3)
CO2: 26 mmol/L (ref 22–32)
CREATININE: 1.06 mg/dL (ref 0.61–1.24)
Chloride: 102 mmol/L (ref 98–111)
GFR calc Af Amer: >60 mL/min (ref >60)
GLUCOSE: 317 mg/dL — AB (ref 70–99)
Potassium: 3.6 mmol/L (ref 3.5–5.1)
SODIUM: 138 mmol/L (ref 135–145)
TOTAL PROTEIN: 7.2 g/dL (ref 6.5–8.1)

## 2018-02-05 LAB — CBC WITH DIFFERENTIAL/PLATELET
Abs Immature Granulocytes: 0 10*3/uL (ref 0.0–0.1)
BASOS ABS: 0 10*3/uL (ref 0.0–0.1)
BASOS PCT: 0 %
EOS ABS: 0.1 10*3/uL (ref 0.0–0.7)
EOS PCT: 1 %
HCT: 48.1 % (ref 39.0–52.0)
Hemoglobin: 15.9 g/dL (ref 13.0–17.0)
Immature Granulocytes: 0 %
Lymphocytes Relative: 33 %
Lymphs Abs: 2.8 10*3/uL (ref 0.7–4.0)
MCH: 31.2 pg (ref 26.0–34.0)
MCHC: 33.1 g/dL (ref 30.0–36.0)
MCV: 94.3 fL (ref 78.0–100.0)
MONO ABS: 0.5 10*3/uL (ref 0.1–1.0)
Monocytes Relative: 6 %
Neutro Abs: 5 10*3/uL (ref 1.7–7.7)
Neutrophils Relative %: 60 %
Platelets: 311 10*3/uL (ref 150–400)
RBC: 5.1 MIL/uL (ref 4.22–5.81)
RDW: 13.2 % (ref 11.5–15.5)
WBC: 8.5 10*3/uL (ref 4.0–10.5)

## 2018-02-05 IMAGING — CT CT PELVIS W/ CM
2 of 4 series · 16 of 46 positions shown, 19 images · IV contrast (APPLIED)
Comparison: None.

CLINICAL DATA: Soft tissue infection of the scrotum. Swelling and
pain.

EXAM:
CT PELVIS WITH CONTRAST
TECHNIQUE: Multidetector CT imaging of the pelvis was performed using the
standard protocol following the bolus administration of intravenous
contrast.
CONTRAST:  100mL OMNIPAQUE IOHEXOL 300 MG/ML  SOLN

[Series 5: soft tissue · axial · 0.81mm/px · z∈[+568,+882]mm · 13 of 175 slices shown, 16 images]
[im 12/175  soft-tissue]
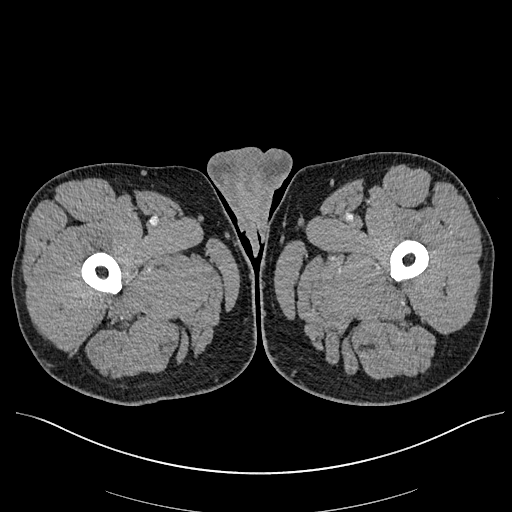
[im 12/175  bone]
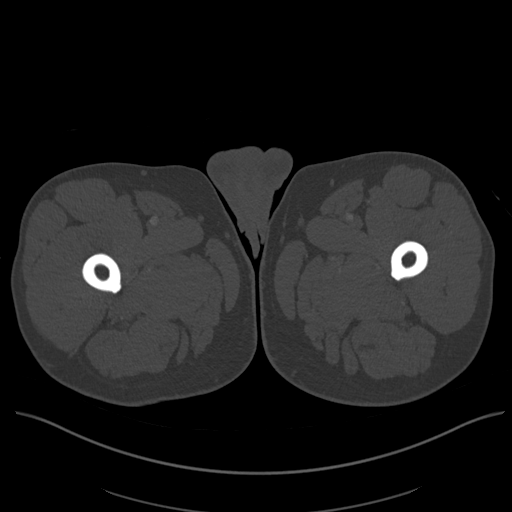
[im 29/175  soft-tissue]
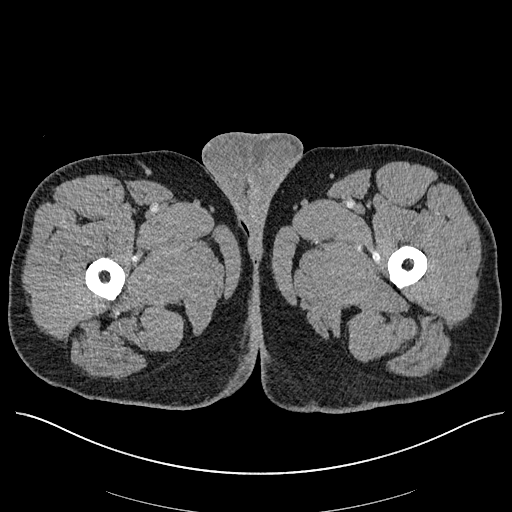
[im 45/175  soft-tissue]
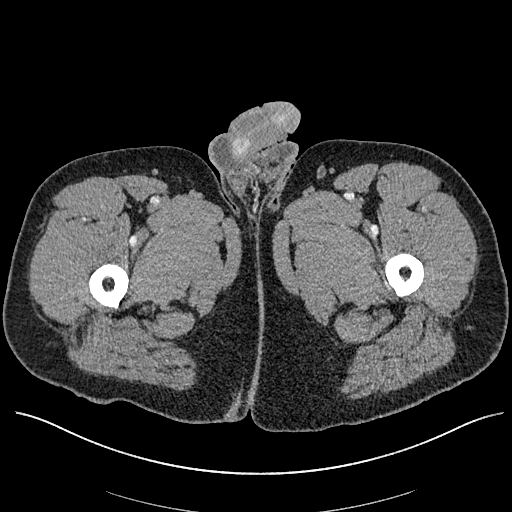
[im 62/175  soft-tissue]
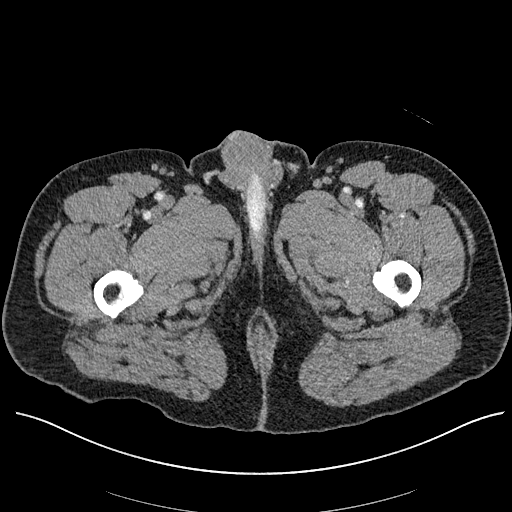
[im 79/175  soft-tissue]
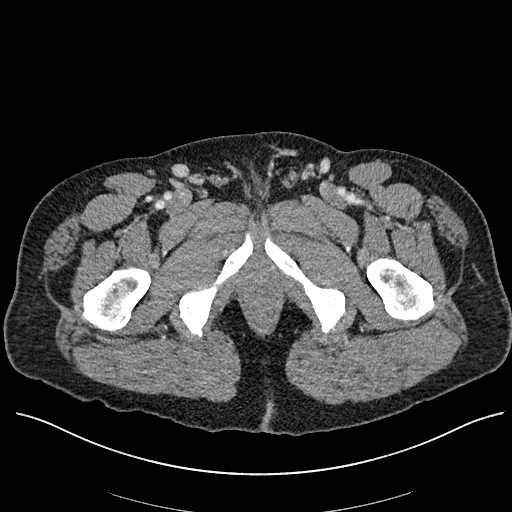
[im 96/175  soft-tissue]
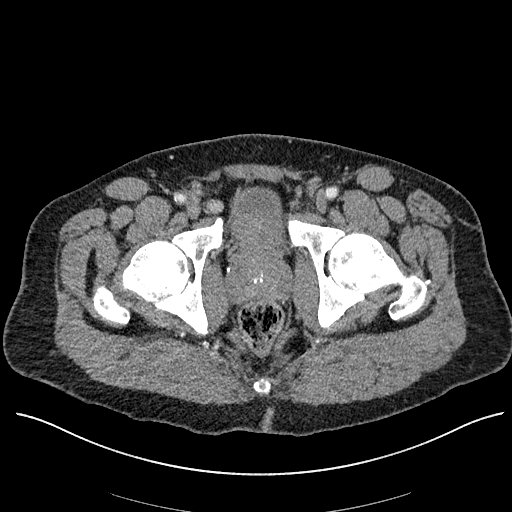
[im 113/175  soft-tissue]
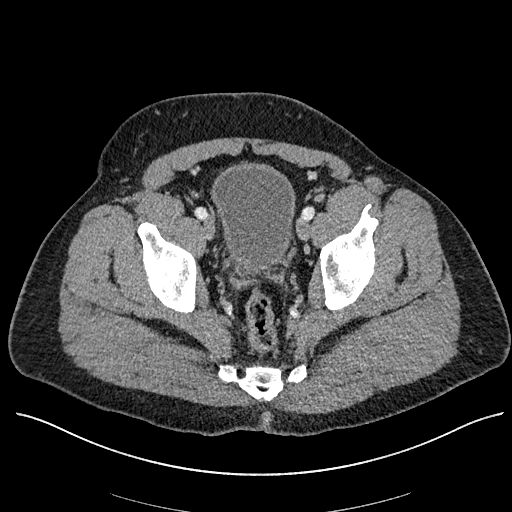
[im 130/175  soft-tissue]
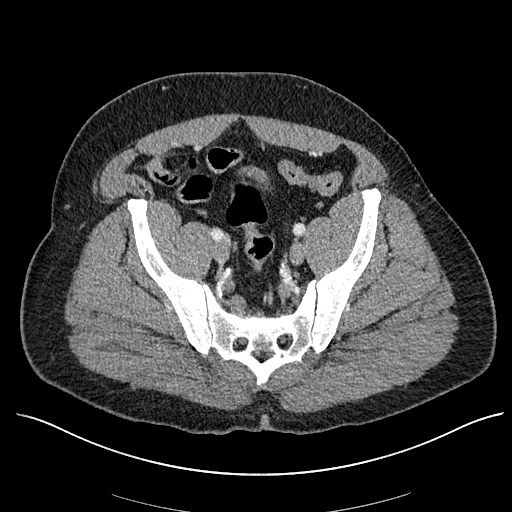
[im 146/175  soft-tissue]
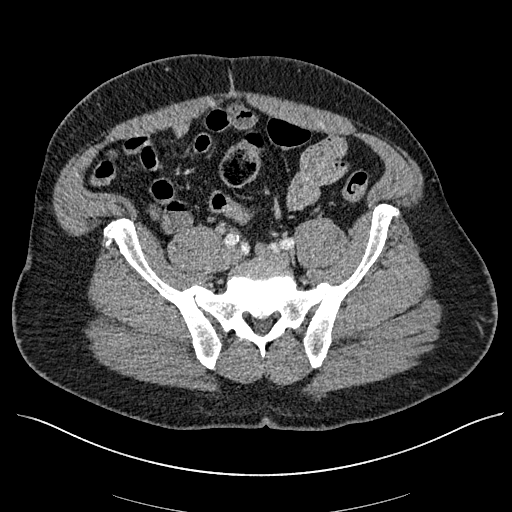
[im 146/175  bone]
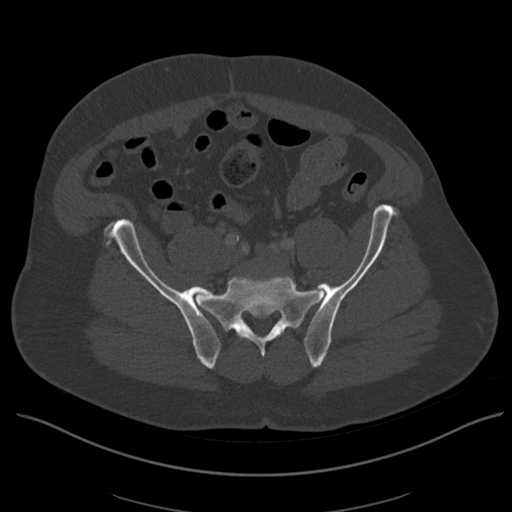
[im 152/175  lung]
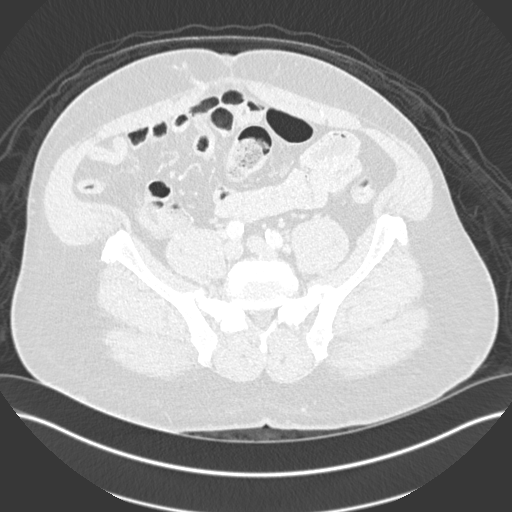
[im 158/175  lung]
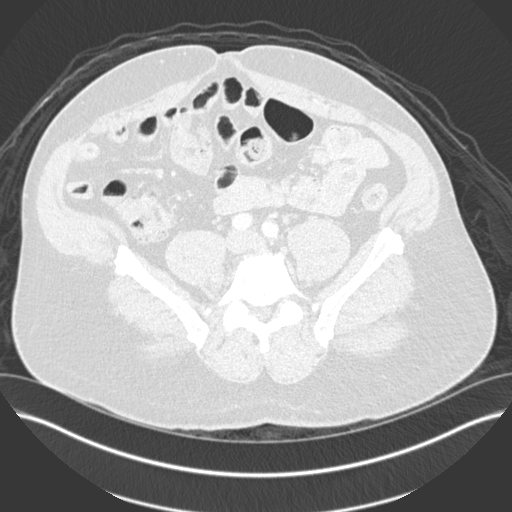
[im 163/175  soft-tissue]
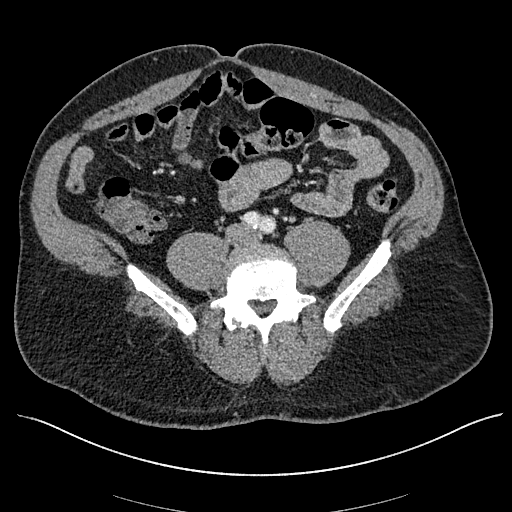
[im 163/175  lung]
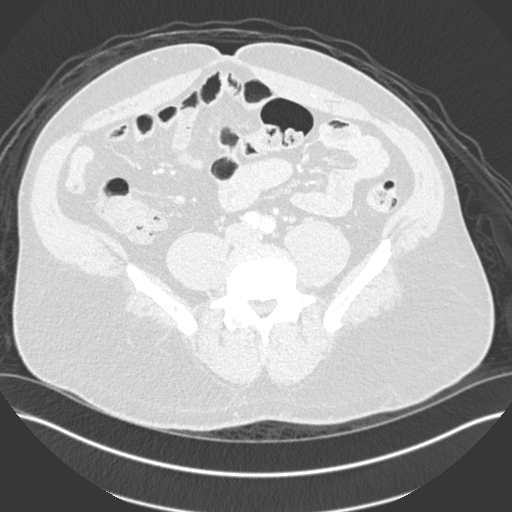
[im 169/175  lung]
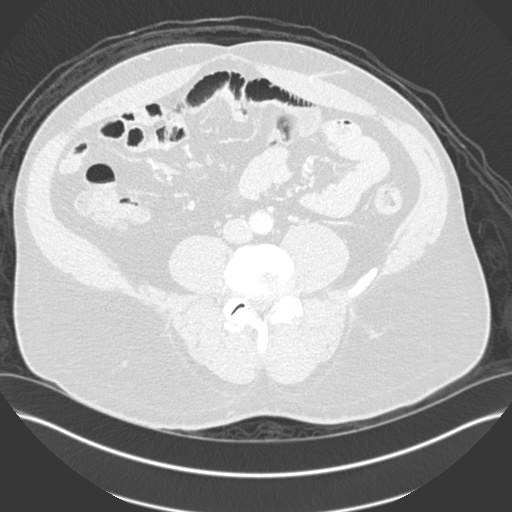

[Series 6: cor soft · coronal · 0.70mm/px · 3 of 152 slices shown]
[im 51/152  soft-tissue]
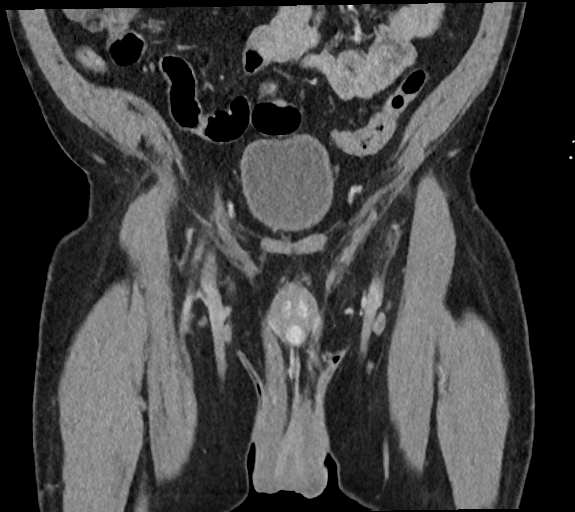
[im 68/152  soft-tissue]
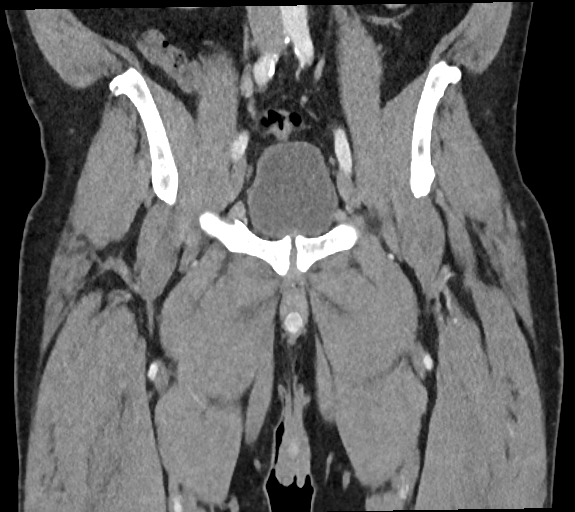
[im 84/152  soft-tissue]
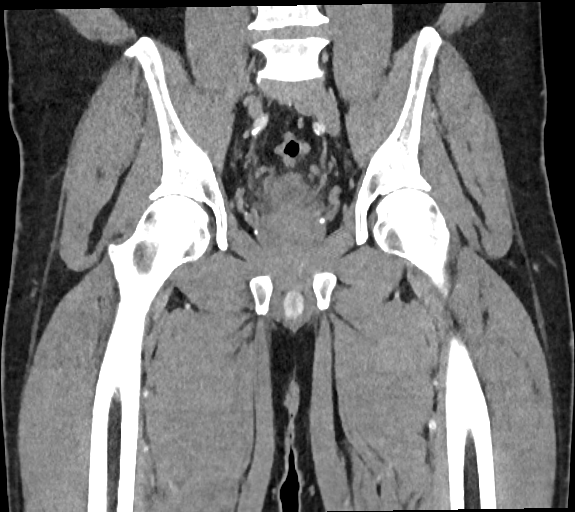

[16 of 46 positions shown; findings below may reference images not displayed]

FINDINGS: Urinary Tract:  Normal

Bowel:  Normal

Vascular/Lymphatic: Atherosclerotic change of the aorta and iliac
arteries. No aneurysm.

Reproductive: Penis appears normal. There is inflammatory swelling
of the scrotum with an apparent defect along the inferior midline.
There is some marginal enhancement in this region the probably
represents the pre-existing abscess that has decompressed. There is
a small amount of fluid within the scrotum that could be ordinary
hydroceles, but I cannot rule out the possibility of intrascrotal
infection. The testicles themselves appear normal. There does not
appear to be any soft tissue inflammatory change tracking elsewhere.

Other:  No free fluid or air.

Musculoskeletal: No significant bone finding.
IMPRESSION: Inflammatory disease of the scrotum with swelling. Probable
decompressed abscess of the inferior aspect of the scrotum. Small
amount of intrascrotal fluid that could be ordinary hydroceles.
However, I cannot exclude the possibility of intrascrotal infection.

## 2018-02-05 MED ORDER — CLINDAMYCIN HCL 150 MG PO CAPS
300.0000 mg | ORAL_CAPSULE | Freq: Once | ORAL | Status: AC
  Administered 2018-02-05: 300 mg via ORAL
  Filled 2018-02-05: qty 2

## 2018-02-05 MED ORDER — OXYCODONE-ACETAMINOPHEN 5-325 MG PO TABS
1.0000 | ORAL_TABLET | Freq: Once | ORAL | Status: AC
  Administered 2018-02-05: 1 via ORAL
  Filled 2018-02-05: qty 1

## 2018-02-05 MED ORDER — HYDROCODONE-ACETAMINOPHEN 5-325 MG PO TABS: 1.0000 | ORAL_TABLET | ORAL | 0 refills | Status: DC | PRN

## 2018-02-05 MED ORDER — CLINDAMYCIN HCL 300 MG PO CAPS: 300.0000 mg | ORAL_CAPSULE | Freq: Four times a day (QID) | ORAL | 0 refills | Status: AC

## 2018-02-05 MED ORDER — IOHEXOL 300 MG/ML  SOLN
100.0000 mL | Freq: Once | INTRAMUSCULAR | Status: AC | PRN
  Administered 2018-02-05: 100 mL via INTRAVENOUS

## 2018-02-05 NOTE — ED Triage Notes (Signed)
Pt to ER for one week testicular swelling with purulent drainage. PA Laveda Norman in to assist with assessing the area, there appears to be an abscess. Pt is diabetic.

## 2018-02-05 NOTE — ED Provider Notes (Signed)
[facility] EMERGENCY DEPARTMENT Provider Note   CSN: [id number] Arrival date & time: 02/05/18  1043     History   Chief Complaint Chief Complaint  Patient presents with  . Testicle Pain  . Abscess    HPI Ryan Hughes is a 52 y.o. male.  52 year old male presents with complaint of a boil in his scrotum.  Patient states he first noticed the area a week and a half ago, has been applying OTC cream without improvement and states today the area is open and draining.  The drainage prompted his visit today as well as pain in the general area and pain with walking.  He denies fevers, chills, difficulty urinating.  Patient is an insulin-dependent diabetic, daily smoker.     Past Medical History:  Diagnosis Date  . Diabetes mellitus without complication (HCC)     There are no active problems to display for this patient.   History reviewed. No pertinent surgical history.      Home Medications    Prior to Admission medications   Medication Sig Start Date End Date Taking? Authorizing Provider  amoxicillin-clavulanate (AUGMENTIN) 875-125 MG tablet Take 1 tablet by mouth every 12 (twelve) hours. 12/06/17   Wurst, [place], PA-C  clindamycin (CLEOCIN) 300 MG capsule Take 1 capsule (300 mg total) by mouth 4 (four) times daily for 10 days. 02/05/18 02/15/18  Jeannie Fend, PA-C  HYDROcodone-acetaminophen (NORCO/VICODIN) 5-325 MG tablet Take 1 tablet by mouth every 4 (four) hours as needed. 02/05/18   Jeannie Fend, PA-C  insulin NPH (HUMULIN N,NOVOLIN N) 100 UNIT/ML injection Inject 25 Units into the skin 2 (two) times daily.    [provider]  insulin regular (NOVOLIN R,HUMULIN R) 100 units/mL injection Inject 25 Units into the skin 3 (three) times daily before meals.    [provider]  naproxen (NAPROSYN) 500 MG tablet Take 1 tablet (500 mg total) by mouth 2 (two) times daily. 12/06/17   Wurst, [place], PA-C  oxyCODONE-acetaminophen (PERCOCET)  5-325 MG per tablet Take 1 tablet by mouth every 6 (six) hours as needed for pain. 01/03/13   Purvis Sheffield, MD    Family History History reviewed. No pertinent family history.  Social History Social History   Tobacco Use  . Smoking status: Current Every Day Smoker    Packs/day: 1.00    Types: Cigarettes  . Smokeless tobacco: Never Used  Substance Use Topics  . Alcohol use: Yes  . Drug use: Yes    Types: Marijuana     Allergies   Patient has no known allergies.   Review of Systems Review of Systems  Constitutional: Negative for chills and fever.  Gastrointestinal: Negative for abdominal pain, constipation, diarrhea, nausea and vomiting.  Genitourinary: Positive for scrotal swelling. Negative for decreased urine volume, difficulty urinating, dysuria and testicular pain.  Musculoskeletal: Negative for arthralgias and myalgias.  Skin: Positive for wound.  Allergic/Immunologic: Positive for immunocompromised state.  Hematological: Negative for adenopathy. Does not bruise/bleed easily.  Psychiatric/Behavioral: Negative for confusion.  All other systems reviewed and are negative.    Physical Exam Updated Vital Signs BP (!) 174/92   Pulse 70   Temp 98.2 F (36.8 C) (Oral)   Resp 18   SpO2 99%   Physical Exam  Constitutional: He is oriented to person, place, and time. He appears well-developed and well-nourished. No distress.  HENT:  Head: Normocephalic and atraumatic.  Cardiovascular: Normal rate, regular rhythm, normal heart sounds and intact distal pulses.  No  murmur heard. Pulmonary/Chest: Effort normal and breath sounds normal. No respiratory distress.  Abdominal: Soft. He exhibits no distension. There is no tenderness.  Genitourinary:     Musculoskeletal: He exhibits no tenderness.  Neurological: He is alert and oriented to person, place, and time.  Skin: Skin is warm and dry. He is not diaphoretic.  Psychiatric: He has a normal mood and affect. His  behavior is normal.  Nursing note and vitals reviewed.    ED Treatments / Results  Labs (all labs ordered are listed, but only abnormal results are displayed) Labs Reviewed  COMPREHENSIVE METABOLIC PANEL - Abnormal; Notable for the following components:      Result Value   Glucose, Bld 317 (*)    All other components within normal limits  CBC WITH DIFFERENTIAL/PLATELET    EKG None  Radiology Ct Pelvis W Contrast  Result Date: 02/05/2018 CLINICAL DATA:  Soft tissue infection of the scrotum. Swelling and pain. EXAM: CT PELVIS WITH CONTRAST TECHNIQUE: Multidetector CT imaging of the pelvis was performed using the standard protocol following the bolus administration of intravenous contrast. CONTRAST:  OMNIPAQUE IOHEXOL 300 MG/ML  SOLN COMPARISON:  None. FINDINGS: Urinary Tract:  Normal Bowel:  Normal Vascular/Lymphatic: Atherosclerotic change of the aorta and iliac arteries. No aneurysm. Reproductive: Penis appears normal. There is inflammatory swelling of the scrotum with an apparent defect along the inferior midline. There is some marginal enhancement in this region the probably represents the pre-existing abscess that has decompressed. There is a small amount of fluid within the scrotum that could be ordinary hydroceles, but I cannot rule out the possibility of intrascrotal infection. The testicles themselves appear normal. There does not appear to be any soft tissue inflammatory change tracking elsewhere. Other:  No free fluid or air. Musculoskeletal: No significant bone finding. IMPRESSION: Inflammatory disease of the scrotum with swelling. Probable decompressed abscess of the inferior aspect of the scrotum. Small amount of intrascrotal fluid that could be ordinary hydroceles. However, I cannot exclude the possibility of intrascrotal infection. Electronically Signed   By: Paulina Fusi M.D.   On: 02/05/2018 16:44    Procedures Procedures (including critical care time)  Medications  Ordered in ED Medications  iohexol (OMNIPAQUE) 300 MG/ML solution 100 mL (100 mLs Intravenous Contrast Given 02/05/18 1431)  oxyCODONE-acetaminophen (PERCOCET/ROXICET) 5-325 MG per tablet 1 tablet (1 tablet Oral Given 02/05/18 1553)  clindamycin (CLEOCIN) capsule 300 mg (300 mg Oral Given 02/05/18 1704)     Initial Impression / Assessment and Plan / ED Course  I have reviewed the triage vital signs and the nursing notes.  Pertinent labs & imaging results that were available during my care of the patient were reviewed by me and considered in my medical decision making (see chart for details).  Clinical Course as of Feb 06 1715  Mon Feb 05, 2018  584 52 year old male presents with complaint of abscess to scrotum x1.5 weeks.  Patient states the area is open and draining as of today.  Patient has a history of diabetes, generally noncompliant.  On exam patient has purulent drainage with aeration.  There is no tenderness to the remaining scrotum or surrounding groin areas.  Difficulty urinating, fevers, chills.  Lab work shows elevated blood sugar, CT showing decompressed abscess.  CBC unremarkable.  Patient was given first dose of clindamycin with prescription for same.  Follow-up with urology for recheck, apply warm compresses to area and return to ER for worsening or concerning symptoms.  Patient verbalizes understanding of discharge  instructions and plan.   [LM]  1716 Discussed with ER attending who has reviewed CT report and agrees with plan of care.   [LM]    Clinical Course User Index [LM] Jeannie Fend, PA-C   Final Clinical Impressions(s) / ED Diagnoses   Final diagnoses:  Abscess, scrotum    ED Discharge Orders         Ordered    clindamycin (CLEOCIN) 300 MG capsule  4 times daily     02/05/18 1653    HYDROcodone-acetaminophen (NORCO/VICODIN) 5-325 MG tablet  Every 4 hours PRN     02/05/18 1653           Jeannie Fend, PA-C 02/05/18 1716    Virgina Norfolk,  DO 02/06/18 6962

## 2018-02-05 NOTE — Discharge Instructions (Addendum)
Apply warm compresses to area for 20 minutes at a time. Take antibiotics as prescribed and complete the full course. Follow up with Urology for recheck, return to ER for worsening or concerning symptoms.

## 2018-04-15 ENCOUNTER — Encounter (HOSPITAL_COMMUNITY): Payer: Self-pay

## 2018-04-15 ENCOUNTER — Emergency Department (HOSPITAL_COMMUNITY): Payer: Self-pay

## 2018-04-15 ENCOUNTER — Emergency Department (HOSPITAL_COMMUNITY)
Admission: EM | Admit: 2018-04-15 | Discharge: 2018-04-15 | Disposition: A | Discharge status: Home / Self Care | Payer: Self-pay | Attending: Emergency Medicine | Admitting: Emergency Medicine

## 2018-04-15 ENCOUNTER — Other Ambulatory Visit: Payer: Self-pay

## 2018-04-15 DIAGNOSIS — R739 Hyperglycemia, unspecified: Secondary | ICD-10-CM

## 2018-04-15 DIAGNOSIS — F17210 Nicotine dependence, cigarettes, uncomplicated: Secondary | ICD-10-CM | POA: Insufficient documentation

## 2018-04-15 DIAGNOSIS — K6289 Other specified diseases of anus and rectum: Secondary | ICD-10-CM | POA: Insufficient documentation

## 2018-04-15 DIAGNOSIS — K625 Hemorrhage of anus and rectum: Secondary | ICD-10-CM | POA: Insufficient documentation

## 2018-04-15 DIAGNOSIS — K5900 Constipation, unspecified: Secondary | ICD-10-CM | POA: Insufficient documentation

## 2018-04-15 DIAGNOSIS — E1165 Type 2 diabetes mellitus with hyperglycemia: Secondary | ICD-10-CM | POA: Insufficient documentation

## 2018-04-15 DIAGNOSIS — F1290 Cannabis use, unspecified, uncomplicated: Secondary | ICD-10-CM | POA: Insufficient documentation

## 2018-04-15 LAB — CBC WITH DIFFERENTIAL/PLATELET
Abs Immature Granulocytes: 0.02 10*3/uL (ref 0.00–0.07)
Basophils Absolute: 0 10*3/uL (ref 0.0–0.1)
Basophils Relative: 0 %
Eosinophils Absolute: 0.1 10*3/uL (ref 0.0–0.5)
Eosinophils Relative: 1 %
HCT: 47.1 % (ref 39.0–52.0)
Hemoglobin: 15.7 g/dL (ref 13.0–17.0)
Immature Granulocytes: 0 %
Lymphocytes Relative: 32 %
Lymphs Abs: 2.2 10*3/uL (ref 0.7–4.0)
MCH: 30.1 pg (ref 26.0–34.0)
MCHC: 33.3 g/dL (ref 30.0–36.0)
MCV: 90.2 fL (ref 80.0–100.0)
Monocytes Absolute: 0.5 10*3/uL (ref 0.1–1.0)
Monocytes Relative: 7 %
Neutro Abs: 4.1 10*3/uL (ref 1.7–7.7)
Neutrophils Relative %: 60 %
Platelets: 252 10*3/uL (ref 150–400)
RBC: 5.22 MIL/uL (ref 4.22–5.81)
RDW: 12.4 % (ref 11.5–15.5)
WBC: 6.9 10*3/uL (ref 4.0–10.5)
nRBC: 0 % (ref 0.0–0.2)

## 2018-04-15 LAB — CBG MONITORING, ED: Glucose-Capillary: 430 mg/dL — ABNORMAL HIGH (ref 70–99)

## 2018-04-15 LAB — BASIC METABOLIC PANEL
Anion gap: 13 (ref 5–15)
BUN: 14 mg/dL (ref 6–20)
CO2: 25 mmol/L (ref 22–32)
Calcium: 9.5 mg/dL (ref 8.9–10.3)
Chloride: 98 mmol/L (ref 98–111)
Creatinine, Ser: 0.83 mg/dL (ref 0.61–1.24)
GFR calc Af Amer: >60 mL/min (ref >60)
GFR calc non Af Amer: >60 mL/min (ref >60)
Glucose, Bld: 510 mg/dL (ref 70–99)
Potassium: 4.2 mmol/L (ref 3.5–5.1)
Sodium: 136 mmol/L (ref 135–145)

## 2018-04-15 IMAGING — CT CT PELVIS W/ CM
2 of 3 series · 16 of 46 positions shown, 18 images · IV contrast (APPLIED)
Comparison: [DATE]

CLINICAL DATA: Anorectal pain for 2-3 weeks.  Bleeding.

EXAM:
CT PELVIS WITH CONTRAST
TECHNIQUE: Multidetector CT imaging of the pelvis was performed using the
standard protocol following the bolus administration of intravenous
contrast.
CONTRAST:  75 cc OMNIPAQUE IOHEXOL 300 MG/ML  SOLN

[Series 5: soft tissue · axial · 0.80mm/px · z∈[+660,+908]mm · 13 of 144 slices shown, 15 images]
[im 10/144  soft-tissue]
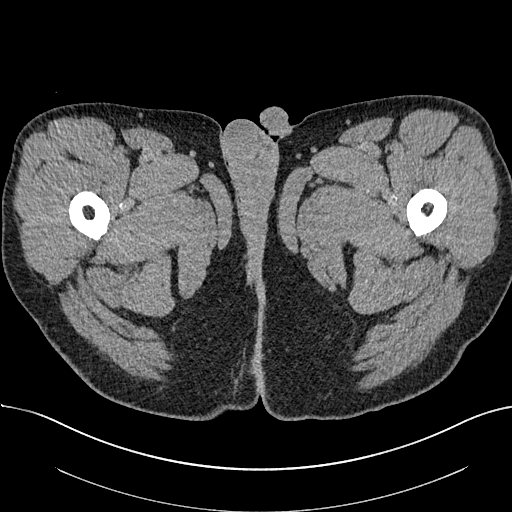
[im 10/144  bone]
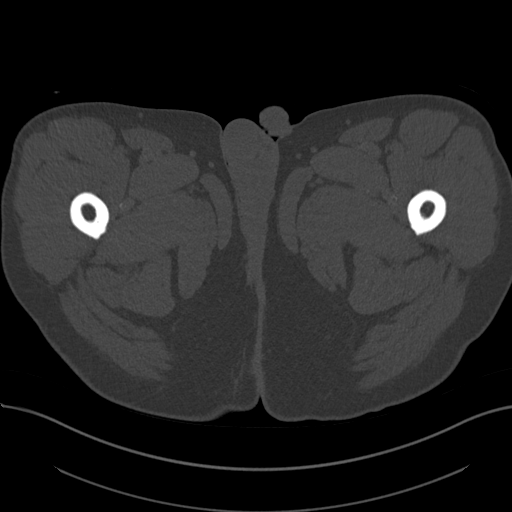
[im 19/144  soft-tissue]
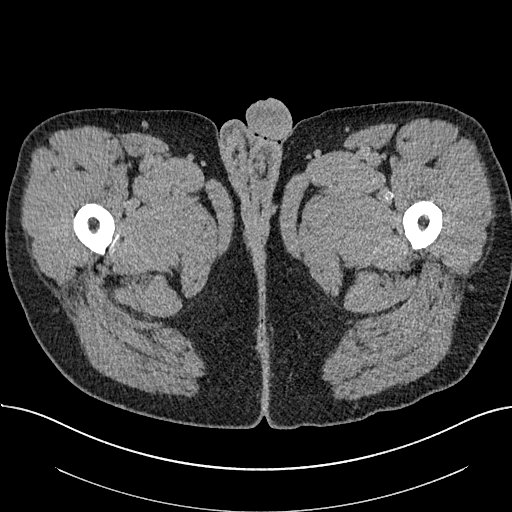
[im 28/144  soft-tissue]
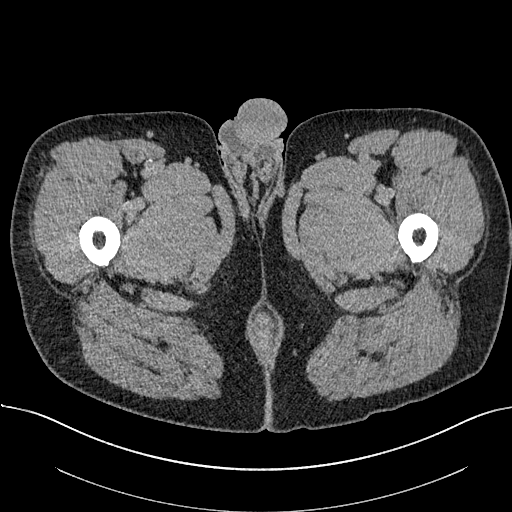
[im 42/144  soft-tissue]
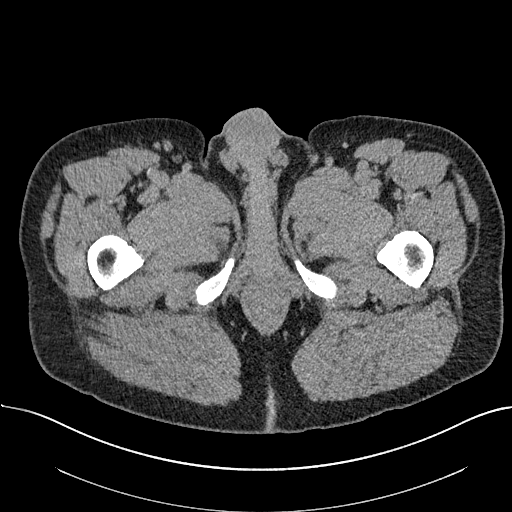
[im 51/144  soft-tissue]
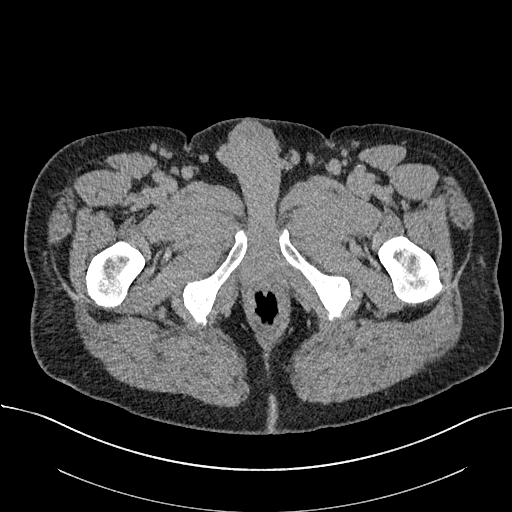
[im 60/144  soft-tissue]
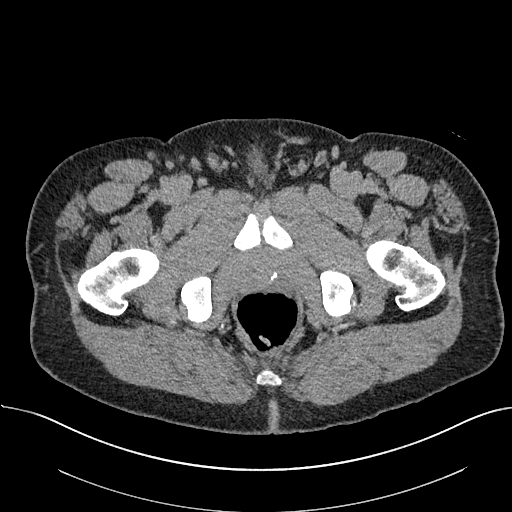
[im 74/144  soft-tissue]
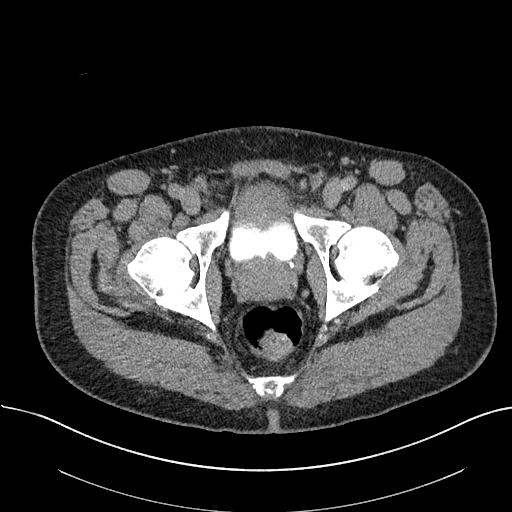
[im 84/144  soft-tissue]
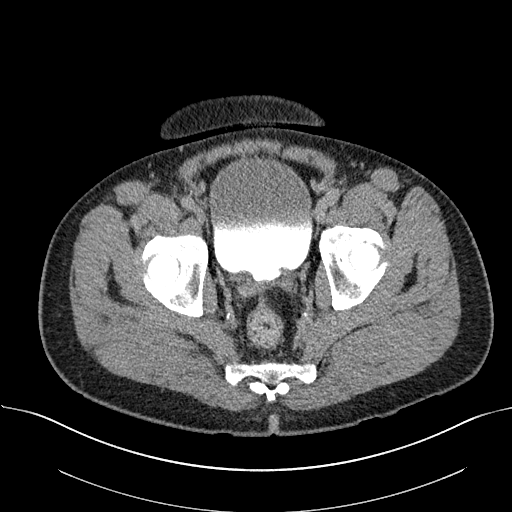
[im 93/144  soft-tissue]
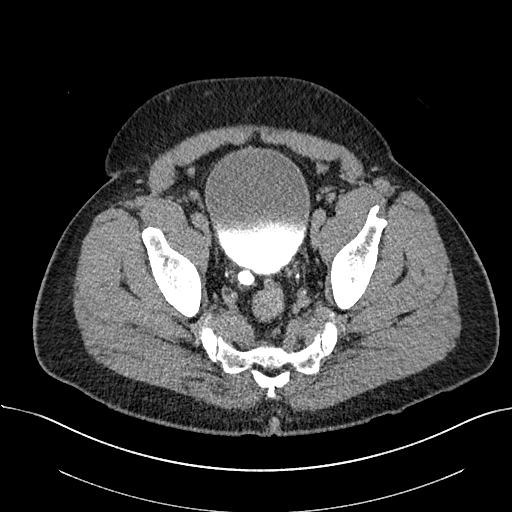
[im 93/144  bone]
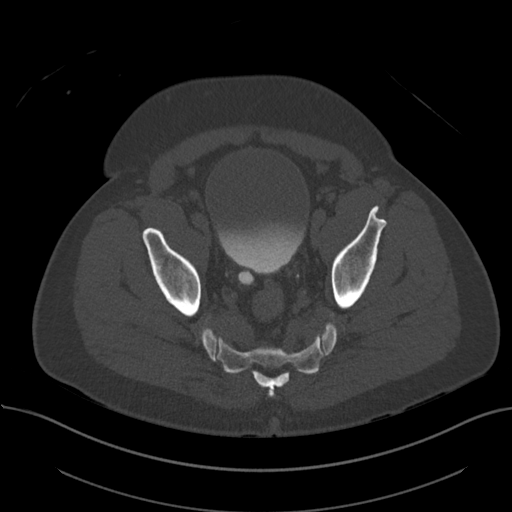
[im 102/144  soft-tissue]
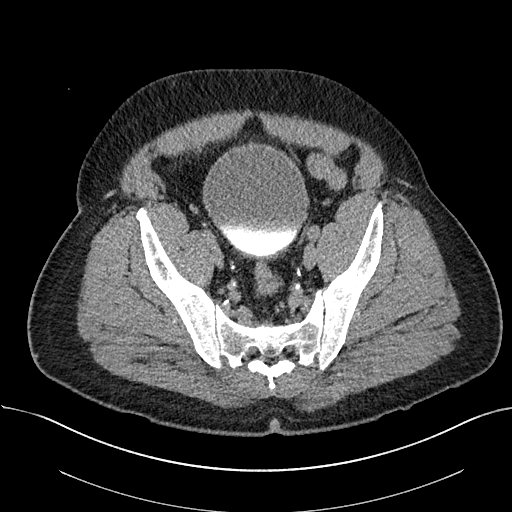
[im 116/144  soft-tissue]
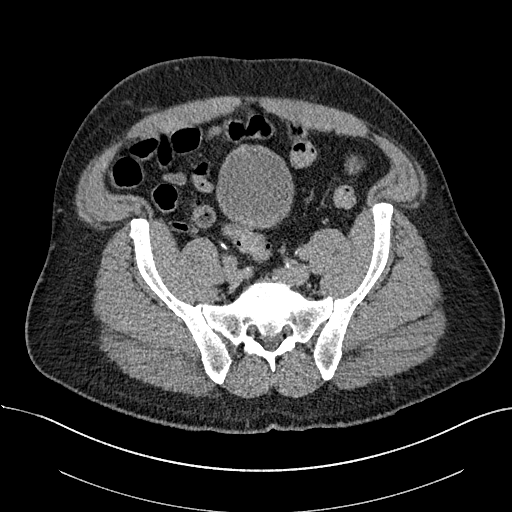
[im 125/144  soft-tissue]
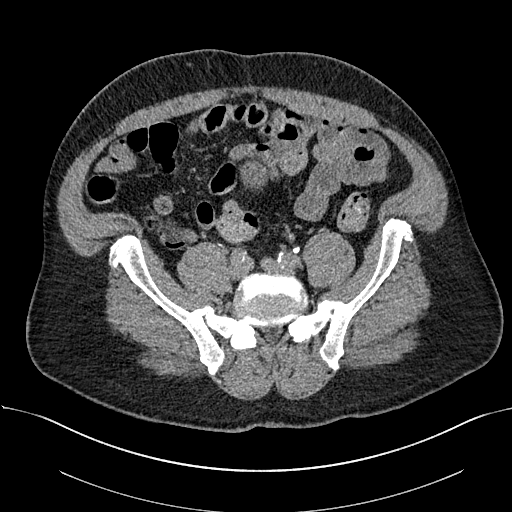
[im 134/144  soft-tissue]
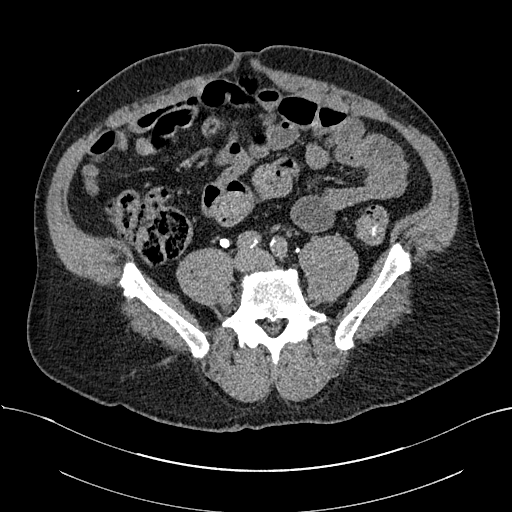

[Series 6: cor soft · coronal · 0.64mm/px · 3 of 191 slices shown]
[im 64/191  soft-tissue]
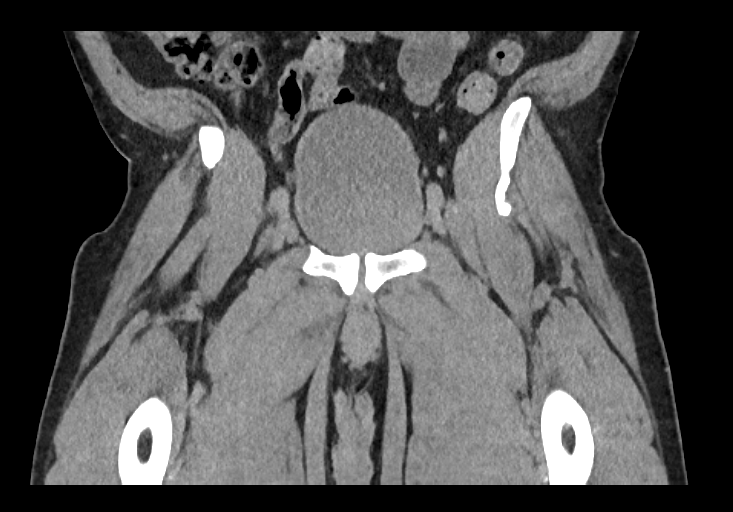
[im 85/191  soft-tissue]
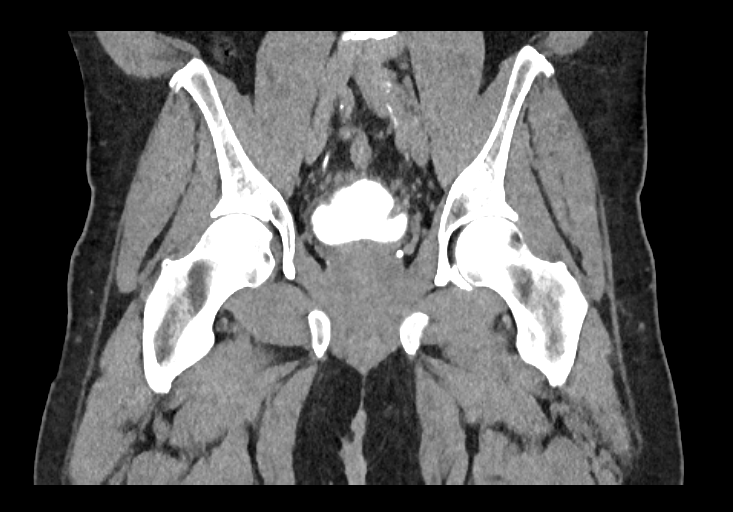
[im 106/191  soft-tissue]
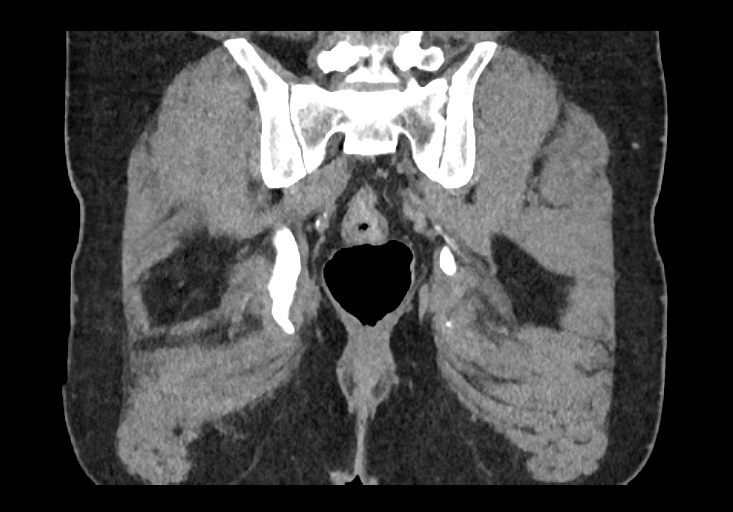

[16 of 46 positions shown; findings below may reference images not displayed]

FINDINGS: Urinary Tract: Small right-sided bladder diverticulum. No bladder
mass or bladder calculi.

Bowel: The visualized colon and small bowel are unremarkable. The
terminal ileum and appendix are normal. No rectal mass or perirectal
abscess. No definite CT findings for perianal fistula or peroneal
abscess. If that is a clinical concern MRI pelvis without and with
contrast is suggested for further evaluation.

Vascular/Lymphatic: Age advanced atherosclerotic calcifications
involving the iliac arteries. No pelvic or inguinal lymphadenopathy.

Reproductive: The prostate gland is slightly enlarged and there is
median lobe hypertrophy impressing on the base the bladder. The
seminal vesicles appear normal.

Other:  No free pelvic fluid collections or pelvic abscess.

Musculoskeletal: No significant bony findings. Bilateral SI joint
degenerative changes.
IMPRESSION: 1. No rectal inflammatory process or mass. No perirectal or perianal
abscess or obvious fistula.
2. No significant intrapelvic abnormalities.
3. Small right-sided posterior bladder diverticulum.
4. Mild prostate gland enlargement with mild median lobe hypertrophy
impressing on the base of the bladder.

## 2018-04-15 MED ORDER — PANTOPRAZOLE SODIUM 20 MG PO TBEC
20.0000 mg | DELAYED_RELEASE_TABLET | Freq: Once | ORAL | Status: AC
  Administered 2018-04-15: 20 mg via ORAL
  Filled 2018-04-15: qty 1

## 2018-04-15 MED ORDER — POLYETHYLENE GLYCOL 3350 17 GM/SCOOP PO POWD: 1.0000 | Freq: Once | ORAL | 0 refills | Status: AC

## 2018-04-15 MED ORDER — DOCUSATE SODIUM 100 MG PO CAPS: 100.0000 mg | ORAL_CAPSULE | Freq: Two times a day (BID) | ORAL | 0 refills | Status: DC

## 2018-04-15 MED ORDER — HYDROCORTISONE ACE-PRAMOXINE 1-1 % RE FOAM
1.0000 | Freq: Once | RECTAL | Status: AC | PRN
  Administered 2018-04-15: 1 via RECTAL
  Filled 2018-04-15: qty 10

## 2018-04-15 MED ORDER — SODIUM CHLORIDE 0.9 % IV BOLUS
1000.0000 mL | Freq: Once | INTRAVENOUS | Status: AC
  Administered 2018-04-15: 1000 mL via INTRAVENOUS

## 2018-04-15 MED ORDER — METFORMIN HCL 1000 MG PO TABS: 500.0000 mg | ORAL_TABLET | Freq: Two times a day (BID) | ORAL | 0 refills | Status: DC

## 2018-04-15 MED ORDER — PRAMOXINE HCL 1 % RE FOAM: Freq: Once | RECTAL | Status: DC | PRN

## 2018-04-15 MED ORDER — IOHEXOL 300 MG/ML  SOLN
75.0000 mL | Freq: Once | INTRAMUSCULAR | Status: AC
  Administered 2018-04-15: 75 mL via INTRAVENOUS

## 2018-04-15 MED ORDER — HYDROCORTISONE ACETATE 25 MG RE SUPP
25.0000 mg | Freq: Once | RECTAL | Status: DC
  Filled 2018-04-15: qty 1

## 2018-04-15 NOTE — ED Notes (Signed)
Patient transported to CT 

## 2018-04-15 NOTE — ED Triage Notes (Signed)
Pt c/o of rectal pain that he was told is hemorrhoids. Pt states he has never felt p ain like this before.

## 2018-04-15 NOTE — Discharge Instructions (Addendum)
Up to 5 times daily as needed Take stool softener twice daily, fiber supplement daily, and eat a high-fiber diet.  Apply the rectal cream up to 5 times daily as needed.  You can take a sitz bath for pain relief as well.  Talk to the pharmacist about what you need for a sitz bath.  Take your metformin for your diabetes as prescribed.   Call [facility] on Monday to set up a follow-up appointment.  Tell them you were referred from the emergency department.  They will probably still have to put you on their waiting list and it may take some time to be seen in the office.  I have also attached information for other clinics in the area that you can follow-up with.  Return to the emergency department if any concerning signs or symptoms develop such as fevers, persistent pain, persistent vomiting, or abdominal pain.  If your blood pressure (BP) was elevated on multiple readings during this visit above 130 for the top number or above 80 for the bottom number, please have this repeated by your primary care provider within one month. You can also check your blood pressure when you are out at a pharmacy or grocery store. Many have machines that will check your blood pressure.  If your blood pressure remains elevated, please follow-up with your PCP.

## 2018-04-15 NOTE — ED Provider Notes (Signed)
[facility] Provider Note   CSN: [id number] Arrival date & time: 04/15/18  [id number]     History   Chief Complaint Chief Complaint  Patient presents with  . Hemorrhoids    HPI Ryan Hughes is a 52 y.o. male with history of uncontrolled diabetes mellitus presents for evaluation of gradual onset, progressively worsening rectal pain for 1 week.  He reports severe sharp burning pain which worsens with certain positions, urinating, and coughing.  He reports that when he urinates and coughs he will also excrete a small amount of stool and bright red blood.  He has tried rectal suppositories and Preparation H which has not been helpful.  He denies abdominal pain, urinary symptoms, nausea, vomiting, fevers, chills, chest pain, or shortness of breath.  He states he has not been taking any medications for his diabetes for 7 or 8 years now and does not have a PCP.  The history is provided by the patient and a significant other.    Past Medical History:  Diagnosis Date  . Diabetes mellitus without complication (HCC)     There are no active problems to display for this patient.   History reviewed. No pertinent surgical history.      Home Medications    Prior to Admission medications   Medication Sig Start Date End Date Taking? Authorizing Provider  amoxicillin-clavulanate (AUGMENTIN) 875-125 MG tablet Take 1 tablet by mouth every 12 (twelve) hours. 12/06/17   Wurst, [place], PA-C  docusate sodium (COLACE) 100 MG capsule Take 1 capsule (100 mg total) by mouth every 12 (twelve) hours. 04/15/18   Tecia Cinnamon A, PA-C  HYDROcodone-acetaminophen (NORCO/VICODIN) 5-325 MG tablet Take 1 tablet by mouth every 4 (four) hours as needed. 02/05/18   Jeannie FendMurphy, Laura A, PA-C  insulin NPH (HUMULIN N,NOVOLIN N) 100 UNIT/ML injection Inject 25 Units into the skin 2 (two) times daily.    [provider]  insulin regular (NOVOLIN R,HUMULIN R) 100 units/mL injection  Inject 25 Units into the skin 3 (three) times daily before meals.    [provider]  metFORMIN (GLUCOPHAGE) 1000 MG tablet Take 0.5 tablets (500 mg total) by mouth 2 (two) times daily with a meal. 04/15/18   Jaycelynn Knickerbocker A, PA-C  naproxen (NAPROSYN) 500 MG tablet Take 1 tablet (500 mg total) by mouth 2 (two) times daily. 12/06/17   Wurst, [place], PA-C  oxyCODONE-acetaminophen (PERCOCET) 5-325 MG per tablet Take 1 tablet by mouth every 6 (six) hours as needed for pain. 01/03/13   Purvis SheffieldHarrison, Forrest, MD  polyethylene glycol powder (GLYCOLAX/MIRALAX) powder Take 255 g by mouth once for 1 dose. 04/15/18 04/15/18  Jeanie SewerFawze, Geetika Laborde A, PA-C    Family History History reviewed. No pertinent family history.  Social History Social History   Tobacco Use  . Smoking status: Current Every Day Smoker    Packs/day: 1.00    Types: Cigarettes  . Smokeless tobacco: Never Used  Substance Use Topics  . Alcohol use: Yes  . Drug use: Yes    Types: Marijuana     Allergies   Patient has no known allergies.   Review of Systems Review of Systems  Constitutional: Negative for chills and fever.  Gastrointestinal: Positive for anal bleeding, constipation and rectal pain. Negative for abdominal pain, nausea and vomiting.  Genitourinary: Negative for dysuria, penile pain, penile swelling, scrotal swelling and testicular pain.  All other systems reviewed and are negative.    Physical Exam Updated Vital Signs BP (!) 166/91 (BP  Location: Right Arm)   Pulse 78   Temp 98.5 F (36.9 C)   Resp 20   SpO2 98%   Physical Exam  Constitutional: He appears well-developed and well-nourished. No distress.  HENT:  Head: Normocephalic and atraumatic.  Eyes: Conjunctivae are normal. Right eye exhibits no discharge. Left eye exhibits no discharge.  Neck: No JVD present. No tracheal deviation present.  Cardiovascular: Normal rate, regular rhythm and normal heart sounds.  Pulmonary/Chest: Effort normal and breath  sounds normal.  Abdominal: Soft. Bowel sounds are normal. He exhibits no distension. There is no tenderness. There is no guarding.  Genitourinary:  Genitourinary Comments: Examination performed in the presence of a chaperone.  There is some fullness to the perineum and mild tenderness.  There is an anal fissure in the 6 o'clock position which is tender to palpation.  The surrounding skin of the rectum is somewhat macerated.  There is a topical cream applied to the area presently.  No frank rectal bleeding.  No abscess noted.  Musculoskeletal: He exhibits no edema.  Neurological: He is alert.  Skin: Skin is warm and dry. No erythema.  Psychiatric: He has a normal mood and affect. His behavior is normal.  Nursing note and vitals reviewed.    ED Treatments / Results  Labs (all labs ordered are listed, but only abnormal results are displayed) Labs Reviewed  BASIC METABOLIC PANEL - Abnormal; Notable for the following components:      Result Value   Glucose, Bld 510 (*)    All other components within normal limits  CBG MONITORING, ED - Abnormal; Notable for the following components:   Glucose-Capillary 430 (*)    All other components within normal limits  CBC WITH DIFFERENTIAL/PLATELET    EKG None  Radiology Ct Pelvis W Contrast  Result Date: 04/15/2018 CLINICAL DATA:  Anorectal pain for 2-3 weeks.  Bleeding. EXAM: CT PELVIS WITH CONTRAST TECHNIQUE: Multidetector CT imaging of the pelvis was performed using the standard protocol following the bolus administration of intravenous contrast. CONTRAST:  75 cc OMNIPAQUE IOHEXOL 300 MG/ML  SOLN COMPARISON:  02/05/2018 FINDINGS: Urinary Tract: Small right-sided bladder diverticulum. No bladder mass or bladder calculi. Bowel: The visualized colon and small bowel are unremarkable. The terminal ileum and appendix are normal. No rectal mass or perirectal abscess. No definite CT findings for perianal fistula or peroneal abscess. If that is a clinical  concern MRI pelvis without and with contrast is suggested for further evaluation. Vascular/Lymphatic: Age advanced atherosclerotic calcifications involving the iliac arteries. No pelvic or inguinal lymphadenopathy. Reproductive: The prostate gland is slightly enlarged and there is median lobe hypertrophy impressing on the base the bladder. The seminal vesicles appear normal. Other:  No free pelvic fluid collections or pelvic abscess. Musculoskeletal: No significant bony findings. Bilateral SI joint degenerative changes. IMPRESSION: 1. No rectal inflammatory process or mass. No perirectal or perianal abscess or obvious fistula. 2. No significant intrapelvic abnormalities. 3. Small right-sided posterior bladder diverticulum. 4. Mild prostate gland enlargement with mild median lobe hypertrophy impressing on the base of the bladder. Electronically Signed   By: Rudie Meyer M.D.   On: 04/15/2018 10:03    Procedures Procedures (including critical care time)  Medications Ordered in ED Medications  pantoprazole (PROTONIX) EC tablet 20 mg (has no administration in time range)  sodium chloride 0.9 % bolus 1,000 mL (0 mLs Intravenous Stopped 04/15/18 0812)  iohexol (OMNIPAQUE) 300 MG/ML solution 75 mL (75 mLs Intravenous Contrast Given 04/15/18 0907)  hydrocortisone-pramoxine (PROCTOFOAM-HC) rectal  foam 1 applicator (1 applicator Rectal Given 04/15/18 0845)     Initial Impression / Assessment and Plan / ED Course  I have reviewed the triage vital signs and the nursing notes.  Pertinent labs & imaging results that were available during my care of the patient were reviewed by me and considered in my medical decision making (see chart for details).     Patient presenting for evaluation of rectal pain.  He is afebrile, hypertensive in the ED.  I did inform him of this and recommended follow-up with PCP for reevaluation.  He is an uncontrolled diabetic and has not taken his metformin or insulin for at least 8 or  9 years.  On examination he has an anal fissure, no evidence of thrombosed or actively bleeding hemorrhoids, no frank rectal bleeding.  He does have some fullness of the perineum and with his uncontrolled diabetes we will obtain a CT pelvis for rule out of abscess or Fournier's gangrene.  Labs reviewed by me significant for hyperglycemia, no evidence of DKA, no renal insufficiency leukocytosis or anemia.  He was given a liter of IV fluids in the ED and a rectal cream was applied with improvement.  He also requested medication for his heartburn but this was not an initial complaint of his.  No chest pain.  CT of the pelvis shows no evidence of abscess or Fournier's gangrene.  On reevaluation patient is resting comfortably no apparent distress.  I will refill his metformin.  We discussed high-fiber diet, stool softener, topical medications, sitz baths, and follow-up with PCP and potentially general surgery for management.  Discussed strict ED return precautions.  Patient and significant other verbalized understanding of and agreement with plan and patient stable for discharge home at this time.  Final Clinical Impressions(s) / ED Diagnoses   Final diagnoses:  Rectal pain  Hyperglycemia    ED Discharge Orders         Ordered    metFORMIN (GLUCOPHAGE) 1000 MG tablet  2 times daily with meals     04/15/18 1025    polyethylene glycol powder (GLYCOLAX/MIRALAX) powder   Once     04/15/18 1025    docusate sodium (COLACE) 100 MG capsule  Every 12 hours     04/15/18 1025           Jeanie Sewer, PA-C 04/15/18 1030    Gerhard Munch, MD 04/17/18 2123

## 2018-04-15 NOTE — ED Notes (Signed)
Date and time results received: 04/15/18 8:43 AM   Test: Glucose Critical Value: 510  Name of Provider Notified: Ardine EngM. Fawze PA

## 2018-05-31 ENCOUNTER — Emergency Department (HOSPITAL_COMMUNITY)
Admission: EM | Admit: 2018-05-31 | Discharge: 2018-05-31 | Disposition: A | Discharge status: Home / Self Care | Payer: Self-pay | Attending: Emergency Medicine | Admitting: Emergency Medicine

## 2018-05-31 ENCOUNTER — Encounter (HOSPITAL_COMMUNITY): Payer: Self-pay

## 2018-05-31 DIAGNOSIS — K649 Unspecified hemorrhoids: Secondary | ICD-10-CM | POA: Insufficient documentation

## 2018-05-31 DIAGNOSIS — Z5321 Procedure and treatment not carried out due to patient leaving prior to being seen by health care provider: Secondary | ICD-10-CM | POA: Insufficient documentation

## 2018-05-31 LAB — CBC
HEMATOCRIT: 46 % (ref 39.0–52.0)
HEMOGLOBIN: 15.5 g/dL (ref 13.0–17.0)
MCH: 30.8 pg (ref 26.0–34.0)
MCHC: 33.7 g/dL (ref 30.0–36.0)
MCV: 91.5 fL (ref 80.0–100.0)
NRBC: 0 % (ref 0.0–0.2)
Platelets: 251 10*3/uL (ref 150–400)
RBC: 5.03 MIL/uL (ref 4.22–5.81)
RDW: 13.1 % (ref 11.5–15.5)
WBC: 7.2 10*3/uL (ref 4.0–10.5)

## 2018-05-31 LAB — BASIC METABOLIC PANEL
ANION GAP: 15 (ref 5–15)
BUN: 9 mg/dL (ref 6–20)
CHLORIDE: 95 mmol/L — AB (ref 98–111)
CO2: 23 mmol/L (ref 22–32)
Calcium: 9.8 mg/dL (ref 8.9–10.3)
Creatinine, Ser: 0.93 mg/dL (ref 0.61–1.24)
GFR calc non Af Amer: >60 mL/min (ref >60)
Glucose, Bld: 604 mg/dL (ref 70–99)
Potassium: 4 mmol/L (ref 3.5–5.1)
Sodium: 133 mmol/L — ABNORMAL LOW (ref 135–145)

## 2018-05-31 NOTE — ED Triage Notes (Signed)
Pt endorses homorrhoid bleeding x 1 weeks. Has hx of same but states that it is worse over the last week. Also having rectal pain.

## 2018-09-17 ENCOUNTER — Other Ambulatory Visit: Payer: Self-pay

## 2018-09-17 ENCOUNTER — Emergency Department (HOSPITAL_COMMUNITY)
Admission: EM | Admit: 2018-09-17 | Discharge: 2018-09-17 | Disposition: A | Discharge status: Home / Self Care | Payer: Self-pay | Attending: Emergency Medicine | Admitting: Emergency Medicine

## 2018-09-17 ENCOUNTER — Emergency Department (HOSPITAL_COMMUNITY): Payer: Self-pay

## 2018-09-17 ENCOUNTER — Encounter (HOSPITAL_COMMUNITY): Payer: Self-pay

## 2018-09-17 DIAGNOSIS — K6289 Other specified diseases of anus and rectum: Secondary | ICD-10-CM | POA: Insufficient documentation

## 2018-09-17 DIAGNOSIS — T383X6A Underdosing of insulin and oral hypoglycemic [antidiabetic] drugs, initial encounter: Secondary | ICD-10-CM | POA: Insufficient documentation

## 2018-09-17 DIAGNOSIS — E1165 Type 2 diabetes mellitus with hyperglycemia: Secondary | ICD-10-CM | POA: Insufficient documentation

## 2018-09-17 DIAGNOSIS — Z91128 Patient's intentional underdosing of medication regimen for other reason: Secondary | ICD-10-CM | POA: Insufficient documentation

## 2018-09-17 DIAGNOSIS — R739 Hyperglycemia, unspecified: Secondary | ICD-10-CM

## 2018-09-17 LAB — CBC WITH DIFFERENTIAL/PLATELET
Abs Immature Granulocytes: 0.01 10*3/uL (ref 0.00–0.07)
Basophils Absolute: 0 10*3/uL (ref 0.0–0.1)
Basophils Relative: 0 %
Eosinophils Absolute: 0.1 10*3/uL (ref 0.0–0.5)
Eosinophils Relative: 1 %
HCT: 43.2 % (ref 39.0–52.0)
Hemoglobin: 14.7 g/dL (ref 13.0–17.0)
Immature Granulocytes: 0 %
Lymphocytes Relative: 40 %
Lymphs Abs: 2.9 10*3/uL (ref 0.7–4.0)
MCH: 31.3 pg (ref 26.0–34.0)
MCHC: 34 g/dL (ref 30.0–36.0)
MCV: 92.1 fL (ref 80.0–100.0)
Monocytes Absolute: 0.5 10*3/uL (ref 0.1–1.0)
Monocytes Relative: 6 %
Neutro Abs: 3.9 10*3/uL (ref 1.7–7.7)
Neutrophils Relative %: 53 %
Platelets: 240 10*3/uL (ref 150–400)
RBC: 4.69 MIL/uL (ref 4.22–5.81)
RDW: 12.9 % (ref 11.5–15.5)
WBC: 7.4 10*3/uL (ref 4.0–10.5)
nRBC: 0 % (ref 0.0–0.2)

## 2018-09-17 LAB — BASIC METABOLIC PANEL
Anion gap: 13 (ref 5–15)
BUN: 9 mg/dL (ref 6–20)
CO2: 26 mmol/L (ref 22–32)
Calcium: 9.5 mg/dL (ref 8.9–10.3)
Chloride: 98 mmol/L (ref 98–111)
Creatinine, Ser: 0.78 mg/dL (ref 0.61–1.24)
GFR calc Af Amer: >60 mL/min (ref >60)
GFR calc non Af Amer: >60 mL/min (ref >60)
Glucose, Bld: 471 mg/dL — ABNORMAL HIGH (ref 70–99)
Potassium: 4.2 mmol/L (ref 3.5–5.1)
Sodium: 137 mmol/L (ref 135–145)

## 2018-09-17 LAB — CBG MONITORING, ED: Glucose-Capillary: 239 mg/dL — ABNORMAL HIGH (ref 70–99)

## 2018-09-17 IMAGING — CT CT PELVIS WITH CONTRAST
2 of 4 series · 16 of 46 positions shown, 19 images · IV contrast (APPLIED)
Comparison: [DATE]

CLINICAL DATA: Abscess of anal and rectal regions.  Rectal pain.

EXAM:
CT PELVIS WITH CONTRAST
TECHNIQUE: Multidetector CT imaging of the pelvis was performed using the
standard protocol following the bolus administration of intravenous
contrast.
CONTRAST:  100mL OMNIPAQUE IOHEXOL 300 MG/ML  SOLN

[Series 5: soft tissue · axial · 0.70mm/px · z∈[+671,+957]mm · 13 of 160 slices shown, 16 images]
[im 11/160  soft-tissue]
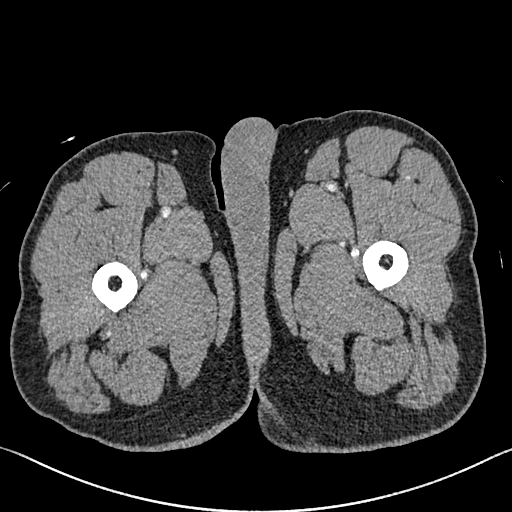
[im 11/160  bone]
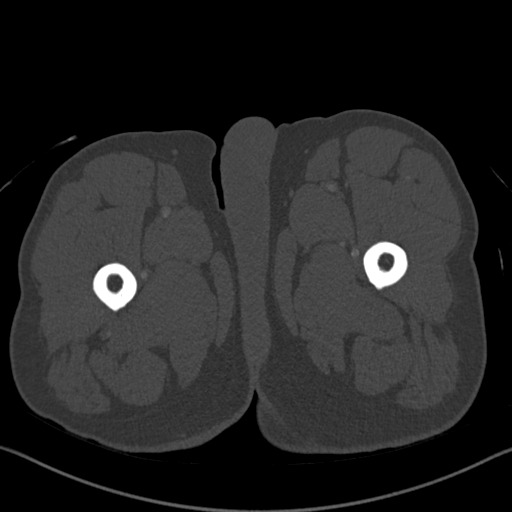
[im 26/160  soft-tissue]
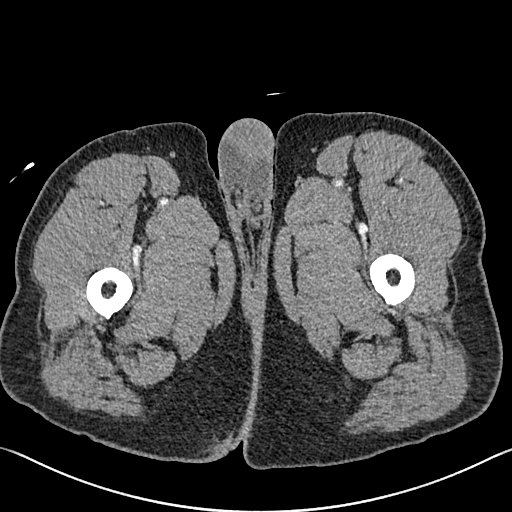
[im 42/160  soft-tissue]
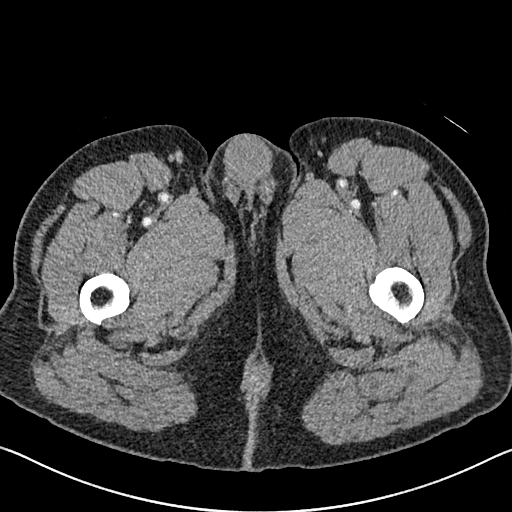
[im 57/160  soft-tissue]
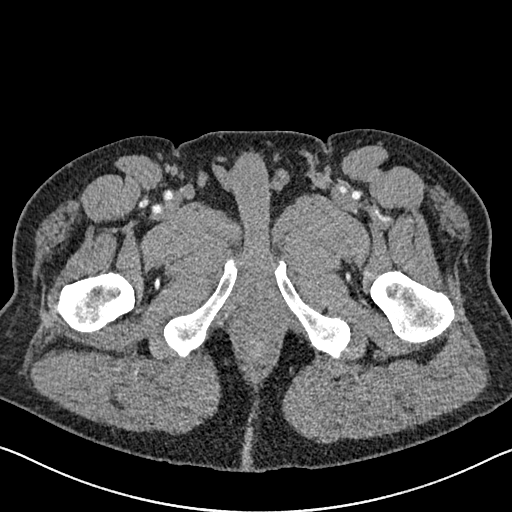
[im 72/160  soft-tissue]
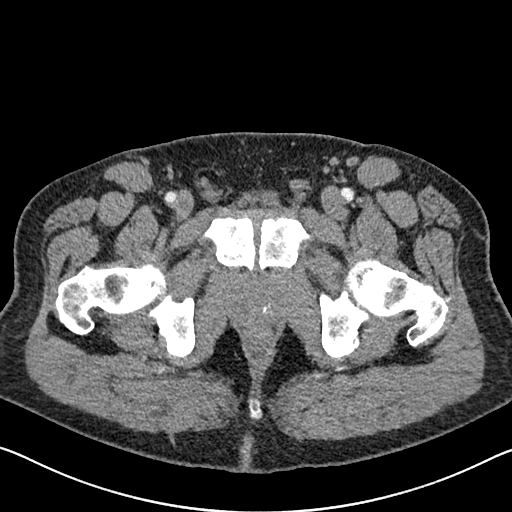
[im 88/160  soft-tissue]
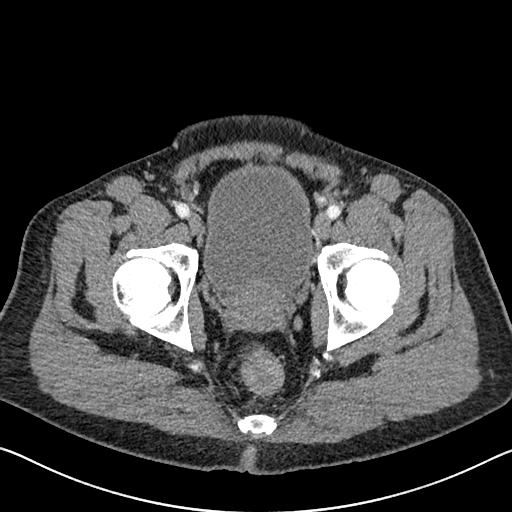
[im 103/160  soft-tissue]
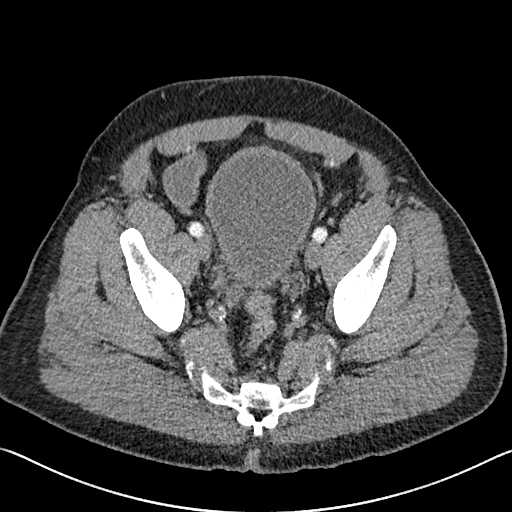
[im 118/160  soft-tissue]
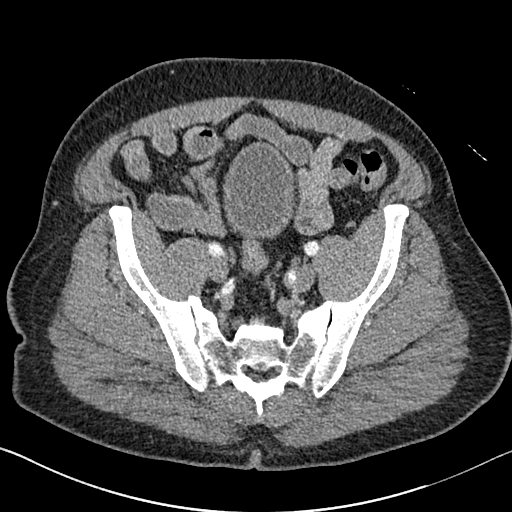
[im 134/160  soft-tissue]
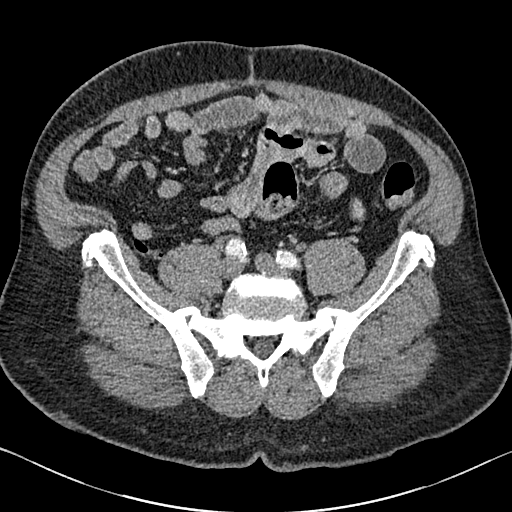
[im 134/160  bone]
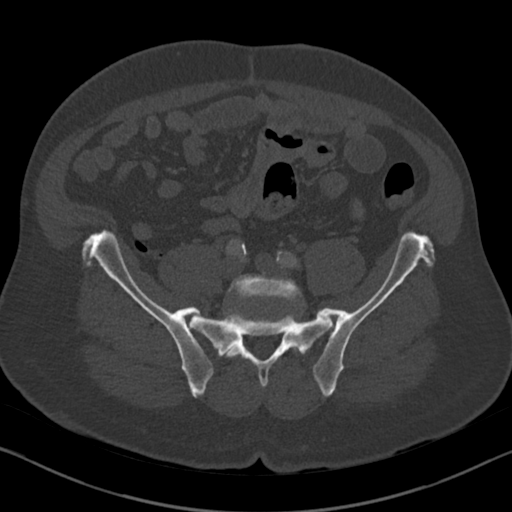
[im 139/160  lung]
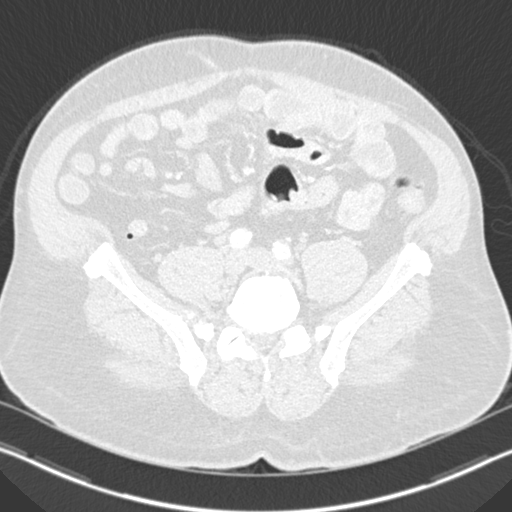
[im 144/160  lung]
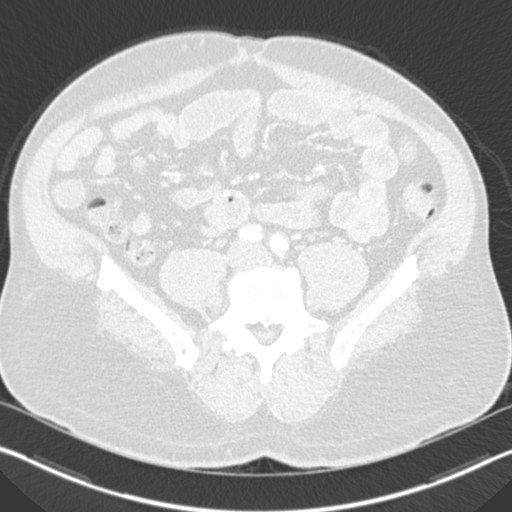
[im 149/160  soft-tissue]
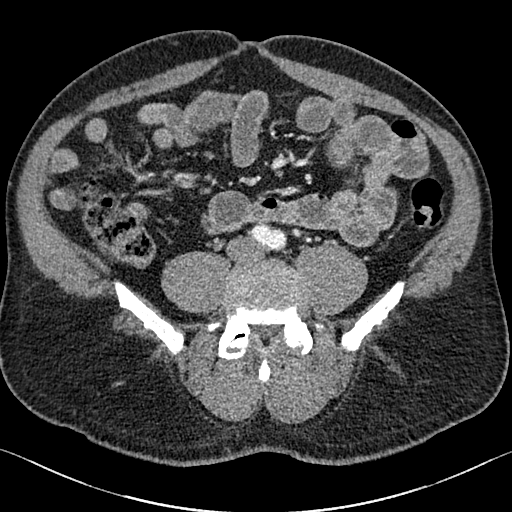
[im 149/160  lung]
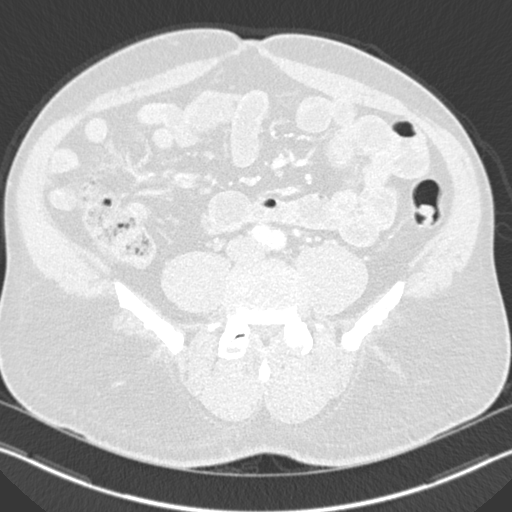
[im 154/160  lung]
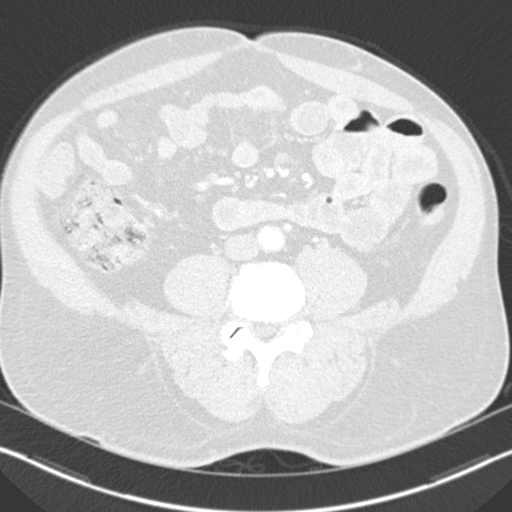

[Series 6: cor soft · coronal · 0.65mm/px · 3 of 149 slices shown]
[im 50/149  soft-tissue]
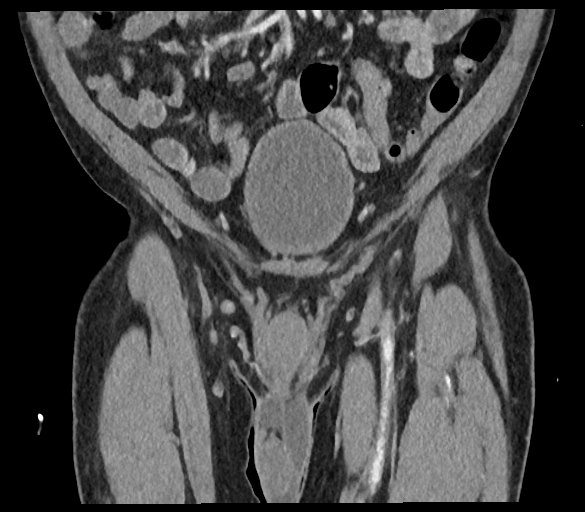
[im 66/149  soft-tissue]
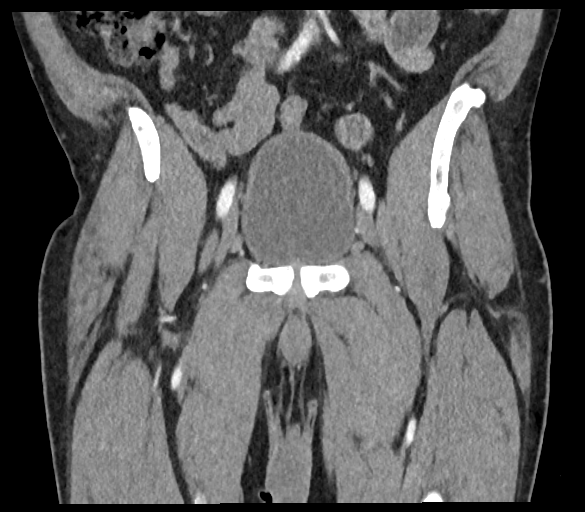
[im 83/149  soft-tissue]
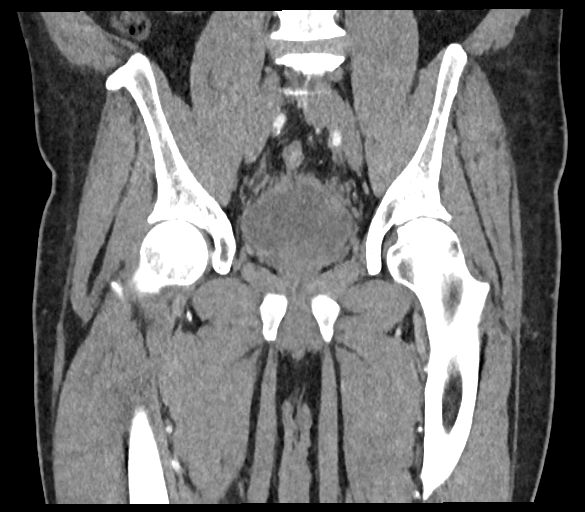

[16 of 46 positions shown; findings below may reference images not displayed]

FINDINGS: Urinary Tract: Small posterior right bladder wall diverticulum again
noted. Ureters decompressed.

Bowel: Visualized colon and small bowel unremarkable. Appendix is
normal. No evidence of bowel obstruction. No visible perineal
abscess or perirectal abscess.

Vascular/Lymphatic: Atherosclerotic change in the iliofemoral
vessels. No aneurysm.

Reproductive: Prostate enlargement with central calcifications,
stable.

Other:  No free fluid or free air.

Musculoskeletal: No acute bony abnormality.
IMPRESSION: No visible perirectal/perianal or perineal abscess.

Iliofemoral atherosclerosis.

Prostate enlargement.  Stable posterior bladder wall diverticulum.

## 2018-09-17 MED ORDER — SODIUM CHLORIDE 0.9 % IV BOLUS
1000.0000 mL | Freq: Once | INTRAVENOUS | Status: AC
  Administered 2018-09-17: 13:00:00 1000 mL via INTRAVENOUS

## 2018-09-17 MED ORDER — HYDROCORTISONE (PERIANAL) 2.5 % EX CREA
TOPICAL_CREAM | Freq: Three times a day (TID) | CUTANEOUS | Status: DC
  Administered 2018-09-17: 15:00:00 via RECTAL
  Filled 2018-09-17: qty 28.35

## 2018-09-17 MED ORDER — INSULIN ASPART 100 UNIT/ML ~~LOC~~ SOLN
10.0000 [IU] | Freq: Once | SUBCUTANEOUS | Status: AC
  Administered 2018-09-17: 10 [IU] via SUBCUTANEOUS

## 2018-09-17 MED ORDER — MORPHINE SULFATE (PF) 4 MG/ML IV SOLN
4.0000 mg | Freq: Once | INTRAVENOUS | Status: AC
  Administered 2018-09-17: 12:00:00 4 mg via INTRAVENOUS
  Filled 2018-09-17: qty 1

## 2018-09-17 MED ORDER — IOHEXOL 300 MG/ML  SOLN
100.0000 mL | Freq: Once | INTRAMUSCULAR | Status: AC | PRN
  Administered 2018-09-17: 100 mL via INTRAVENOUS

## 2018-09-17 MED ORDER — METFORMIN HCL 1000 MG PO TABS: 500.0000 mg | ORAL_TABLET | Freq: Two times a day (BID) | ORAL | 0 refills | Status: DC

## 2018-09-17 NOTE — ED Triage Notes (Signed)
Pt presents to ED with c/o of rectal pain x 1 month, per pt he has hemorrhoids. Pain 7/10.

## 2018-09-17 NOTE — ED Notes (Signed)
Pt transported to Ct.

## 2018-09-17 NOTE — ED Notes (Signed)
Patient verbalizes understanding of discharge instructions. Opportunity for questioning and answers were provided. Armband removed by staff, pt discharged from ED.  

## 2018-09-17 NOTE — ED Provider Notes (Signed)
[facility] EMERGENCY DEPARTMENT Provider Note   CSN: [id number] Arrival date & time: 09/17/18  1115    History   Chief Complaint Chief Complaint  Patient presents with   Rectal Pain    HPI Ryan Hughes is a 53 y.o. male.     HPI   53 year old male presents today with complaints of rectal pain.  Patient notes a several month history of rectal pain, he notes this is worse with wiping, he denies any abdominal pain fever nausea vomiting.  He notes that occasionally he does have pain with a bowel movement.  He notes that he has been seen previously for this although is uncertain what his diagnosis was.  Patient notes he is using a spray that provides temporary relief of his symptoms.  Patient also notes a history of hyperglycemia but has not taken medications for several years.  Past Medical History:  Diagnosis Date   Diabetes mellitus without complication (HCC)     There are no active problems to display for this patient.   No past surgical history on file.      Home Medications    Prior to Admission medications   Medication Sig Start Date End Date Taking? Authorizing Provider  amoxicillin-clavulanate (AUGMENTIN) 875-125 MG tablet Take 1 tablet by mouth every 12 (twelve) hours. 12/06/17   Wurst, [place], PA-C  docusate sodium (COLACE) 100 MG capsule Take 1 capsule (100 mg total) by mouth every 12 (twelve) hours. 04/15/18   Fawze, Mina A, PA-C  HYDROcodone-acetaminophen (NORCO/VICODIN) 5-325 MG tablet Take 1 tablet by mouth every 4 (four) hours as needed. 02/05/18   Jeannie Fend, PA-C  insulin NPH (HUMULIN N,NOVOLIN N) 100 UNIT/ML injection Inject 25 Units into the skin 2 (two) times daily.    [provider]  insulin regular (NOVOLIN R,HUMULIN R) 100 units/mL injection Inject 25 Units into the skin 3 (three) times daily before meals.    [provider]  metFORMIN (GLUCOPHAGE) 1000 MG tablet Take 0.5 tablets (500 mg total) by mouth 2  (two) times daily with a meal. 09/17/18   Shamikia Linskey, Tinnie Gens, PA-C  naproxen (NAPROSYN) 500 MG tablet Take 1 tablet (500 mg total) by mouth 2 (two) times daily. 12/06/17   Wurst, [place], PA-C  oxyCODONE-acetaminophen (PERCOCET) 5-325 MG per tablet Take 1 tablet by mouth every 6 (six) hours as needed for pain. 01/03/13   Purvis Sheffield, MD    Family History History reviewed. No pertinent family history.  Social History Social History   Tobacco Use   Smoking status: Current Every Day Smoker    Packs/day: 1.00    Types: Cigarettes   Smokeless tobacco: Never Used  Substance Use Topics   Alcohol use: Yes   Drug use: Yes    Types: Marijuana     Allergies   Patient has no known allergies.   Review of Systems Review of Systems  All other systems reviewed and are negative.    Physical Exam Updated Vital Signs BP 139/79    Pulse 76    Temp 99 F (37.2 C) (Oral)    Resp 16    Ht 5\' 6"  (1.676 m)    Wt 99.8 kg    SpO2 98%    BMI 35.51 kg/m   Physical Exam Vitals signs and nursing note reviewed.  Constitutional:      Appearance: He is well-developed.  HENT:     Head: Normocephalic and atraumatic.  Eyes:     General: No scleral icterus.  Right eye: No discharge.        Left eye: No discharge.     Conjunctiva/sclera: Conjunctivae normal.     Pupils: Pupils are equal, round, and reactive to light.  Neck:     Musculoskeletal: Normal range of motion.     Vascular: No JVD.     Trachea: No tracheal deviation.  Pulmonary:     Effort: Pulmonary effort is normal.     Breath sounds: No stridor.  Abdominal:     General: There is no distension.     Palpations: Abdomen is soft.  Genitourinary:    Comments: Rectal exam shows no significant abnormalities, no rashes external hemorrhoids, he does have tender noticed to be left rectal wall with no induration, no internal hemorrhoids noted, no blood on digital exam Neurological:     Mental Status: He is alert and oriented to  person, place, and time.     Coordination: Coordination normal.  Psychiatric:        Behavior: Behavior normal.        Thought Content: Thought content normal.        Judgment: Judgment normal.      ED Treatments / Results  Labs (all labs ordered are listed, but only abnormal results are displayed) Labs Reviewed  BASIC METABOLIC PANEL - Abnormal; Notable for the following components:      Result Value   Glucose, Bld 471 (*)    All other components within normal limits  CBG MONITORING, ED - Abnormal; Notable for the following components:   Glucose-Capillary 239 (*)    All other components within normal limits  CBC WITH DIFFERENTIAL/PLATELET    EKG None  Radiology Ct Pelvis W Contrast  Result Date: 09/17/2018 CLINICAL DATA:  Abscess of anal and rectal regions.  Rectal pain. EXAM: CT PELVIS WITH CONTRAST TECHNIQUE: Multidetector CT imaging of the pelvis was performed using the standard protocol following the bolus administration of intravenous contrast. CONTRAST:  100mL OMNIPAQUE IOHEXOL 300 MG/ML  SOLN COMPARISON:  04/15/2018 FINDINGS: Urinary Tract: Small posterior right bladder wall diverticulum again noted. Ureters decompressed. Bowel: Visualized colon and small bowel unremarkable. Appendix is normal. No evidence of bowel obstruction. No visible perineal abscess or perirectal abscess. Vascular/Lymphatic: Atherosclerotic change in the iliofemoral vessels. No aneurysm. Reproductive: Prostate enlargement with central calcifications, stable. Other:  No free fluid or free air. Musculoskeletal: No acute bony abnormality. IMPRESSION: No visible perirectal/perianal or perineal abscess. Iliofemoral atherosclerosis. Prostate enlargement.  Stable posterior bladder wall diverticulum. Electronically Signed   By: Charlett NoseKevin  Dover M.D.   On: 09/17/2018 13:55    Procedures Procedures (including critical care time)  Medications Ordered in ED Medications  hydrocortisone (ANUSOL-HC) 2.5 % rectal  cream (has no administration in time range)  morphine 4 MG/ML injection 4 mg (4 mg Intravenous Given 09/17/18 1203)  sodium chloride 0.9 % bolus 1,000 mL (1,000 mLs Intravenous New Bag/Given 09/17/18 1258)  insulin aspart (novoLOG) injection 10 Units (10 Units Subcutaneous Given 09/17/18 1302)  iohexol (OMNIPAQUE) 300 MG/ML solution 100 mL (100 mLs Intravenous Contrast Given 09/17/18 1338)     Initial Impression / Assessment and Plan / ED Course  I have reviewed the triage vital signs and the nursing notes.  Pertinent labs & imaging results that were available during my care of the patient were reviewed by me and considered in my medical decision making (see chart for details).        53 year old male presents today with complaints of rectal pain.  Patient has  reassuring CT scan, reassuring labs with no significant elevation in white count or abscess noted.  Uncertain etiology of his pain.  Patient will be given Anusol for symptoms.  He is encouraged to follow-up with his primary care for ongoing symptoms.  Patient was also noted to be significantly hyperglycemic.  He has no signs of DKA or hyperosmolar state.  Patient is noncompliant with his medications at home.  He was given a liter fluid and insulin here which significantly improved his blood sugar.  Patient is stable for outpatient management he will be restarted on his metformin encouraged follow-up closely this week with primary care for ongoing evaluation management.  Is given strict return precautions.  Verbalized understanding and agreement to this plan had no further questions or concerns at the time of discharge.   Final Clinical Impressions(s) / ED Diagnoses   Final diagnoses:  Rectal pain  Hyperglycemia    ED Discharge Orders         Ordered    metFORMIN (GLUCOPHAGE) 1000 MG tablet  2 times daily with meals     09/17/18 1447           Eyvonne Mechanic, PA-C 09/17/18 1449    Gerhard Munch, MD 09/21/18 1932

## 2018-09-17 NOTE — Discharge Instructions (Addendum)
Please read attached information. If you experience any new or worsening signs or symptoms please return to the emergency room for evaluation. Please follow-up with your primary care provider or specialist as discussed. Please use medication prescribed only as directed and discontinue taking if you have any concerning signs or symptoms.   °

## 2019-03-12 ENCOUNTER — Emergency Department (HOSPITAL_COMMUNITY): Payer: Self-pay

## 2019-03-12 ENCOUNTER — Encounter (HOSPITAL_COMMUNITY): Payer: Self-pay | Admitting: Emergency Medicine

## 2019-03-12 ENCOUNTER — Emergency Department (HOSPITAL_COMMUNITY)
Admission: EM | Admit: 2019-03-12 | Discharge: 2019-03-12 | Disposition: A | Discharge status: Home / Self Care | Payer: Self-pay | Attending: Emergency Medicine | Admitting: Emergency Medicine

## 2019-03-12 ENCOUNTER — Other Ambulatory Visit: Payer: Self-pay

## 2019-03-12 DIAGNOSIS — M79672 Pain in left foot: Secondary | ICD-10-CM | POA: Insufficient documentation

## 2019-03-12 DIAGNOSIS — F17210 Nicotine dependence, cigarettes, uncomplicated: Secondary | ICD-10-CM | POA: Insufficient documentation

## 2019-03-12 DIAGNOSIS — Z23 Encounter for immunization: Secondary | ICD-10-CM | POA: Insufficient documentation

## 2019-03-12 DIAGNOSIS — E119 Type 2 diabetes mellitus without complications: Secondary | ICD-10-CM | POA: Insufficient documentation

## 2019-03-12 IMAGING — DX DG FOOT COMPLETE 3+V*L*
3 series · 3 of 3 positions shown · non-contrast
Comparison: None.

CLINICAL DATA: Pain after stepping on nail. History of diabetes
mellitus

EXAM:
LEFT FOOT - COMPLETE 3+ VIEW

[foot ap]
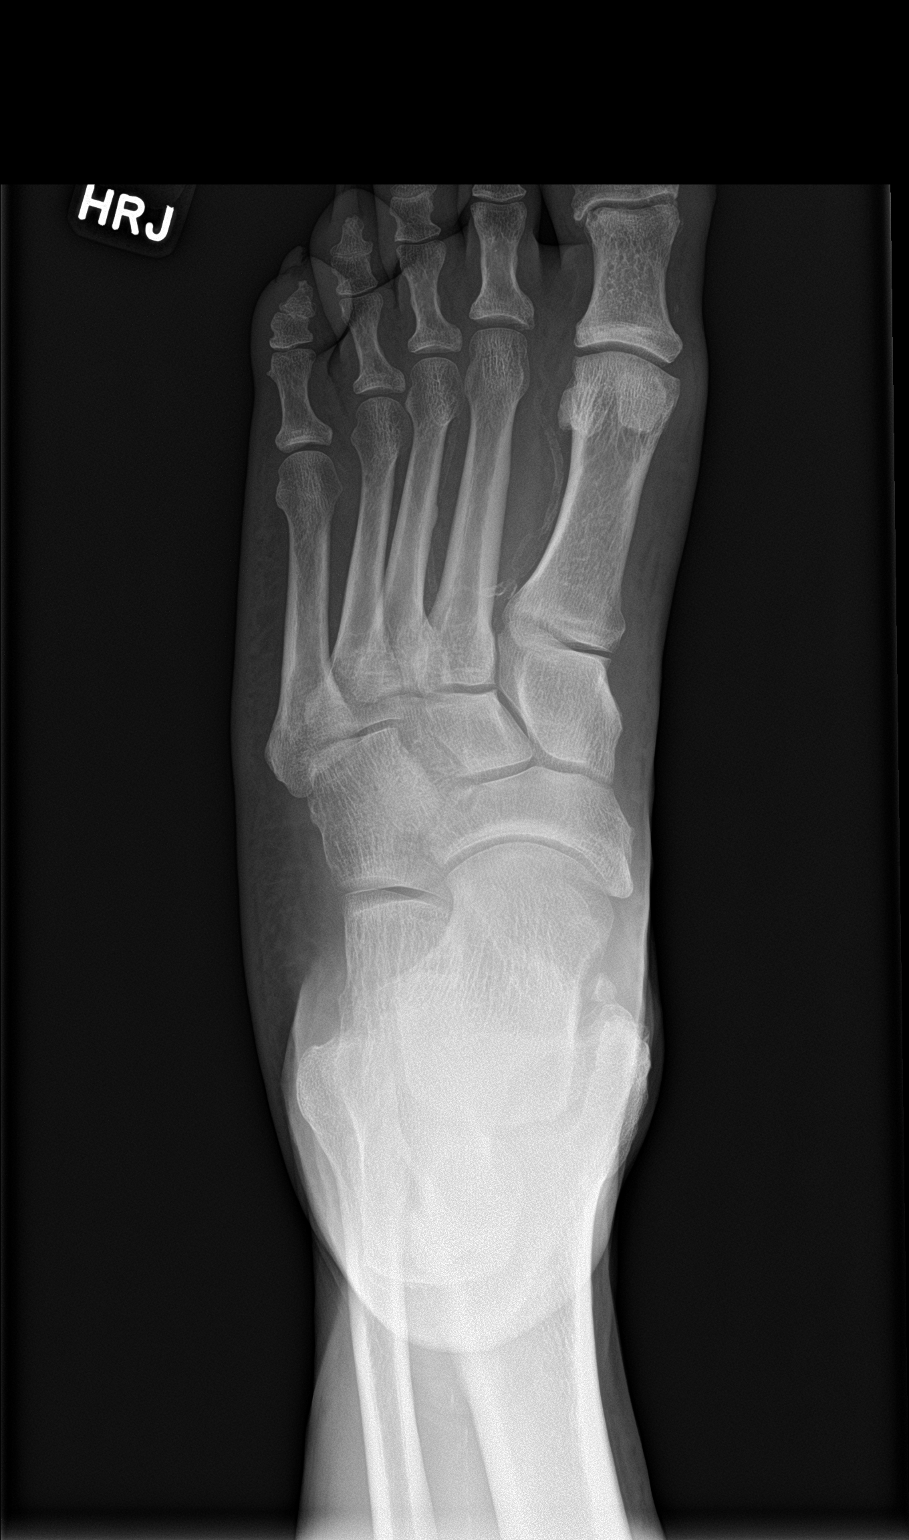

[foot obl]
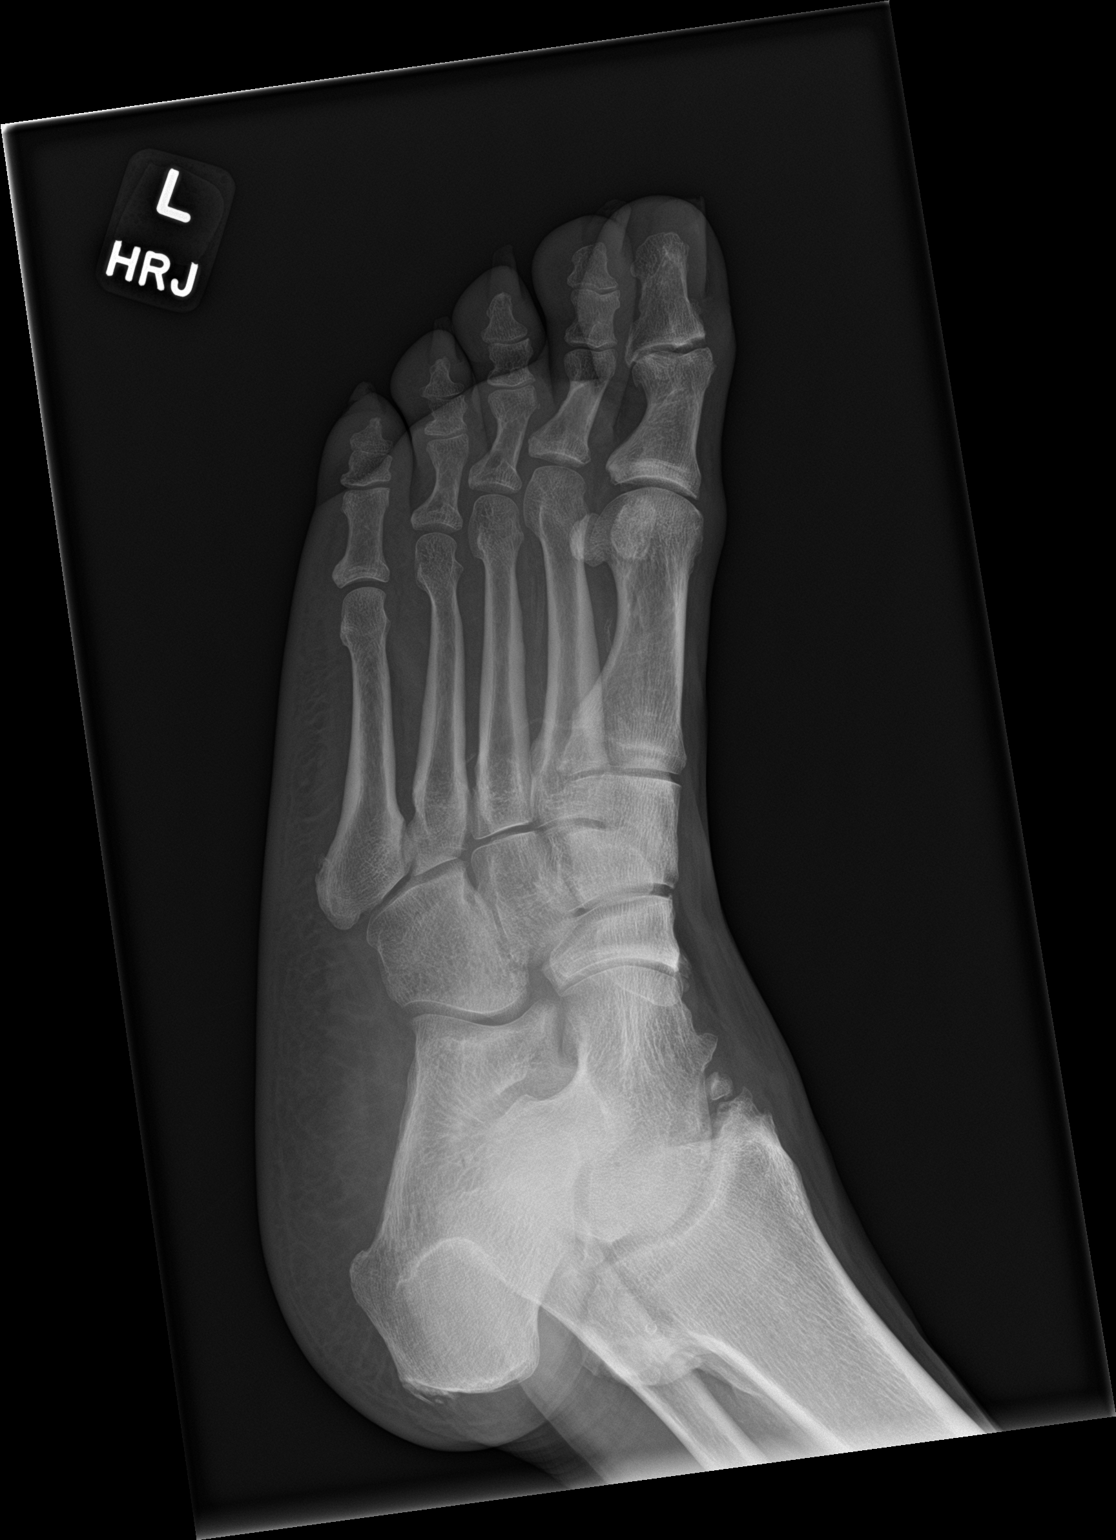

[foot lat]
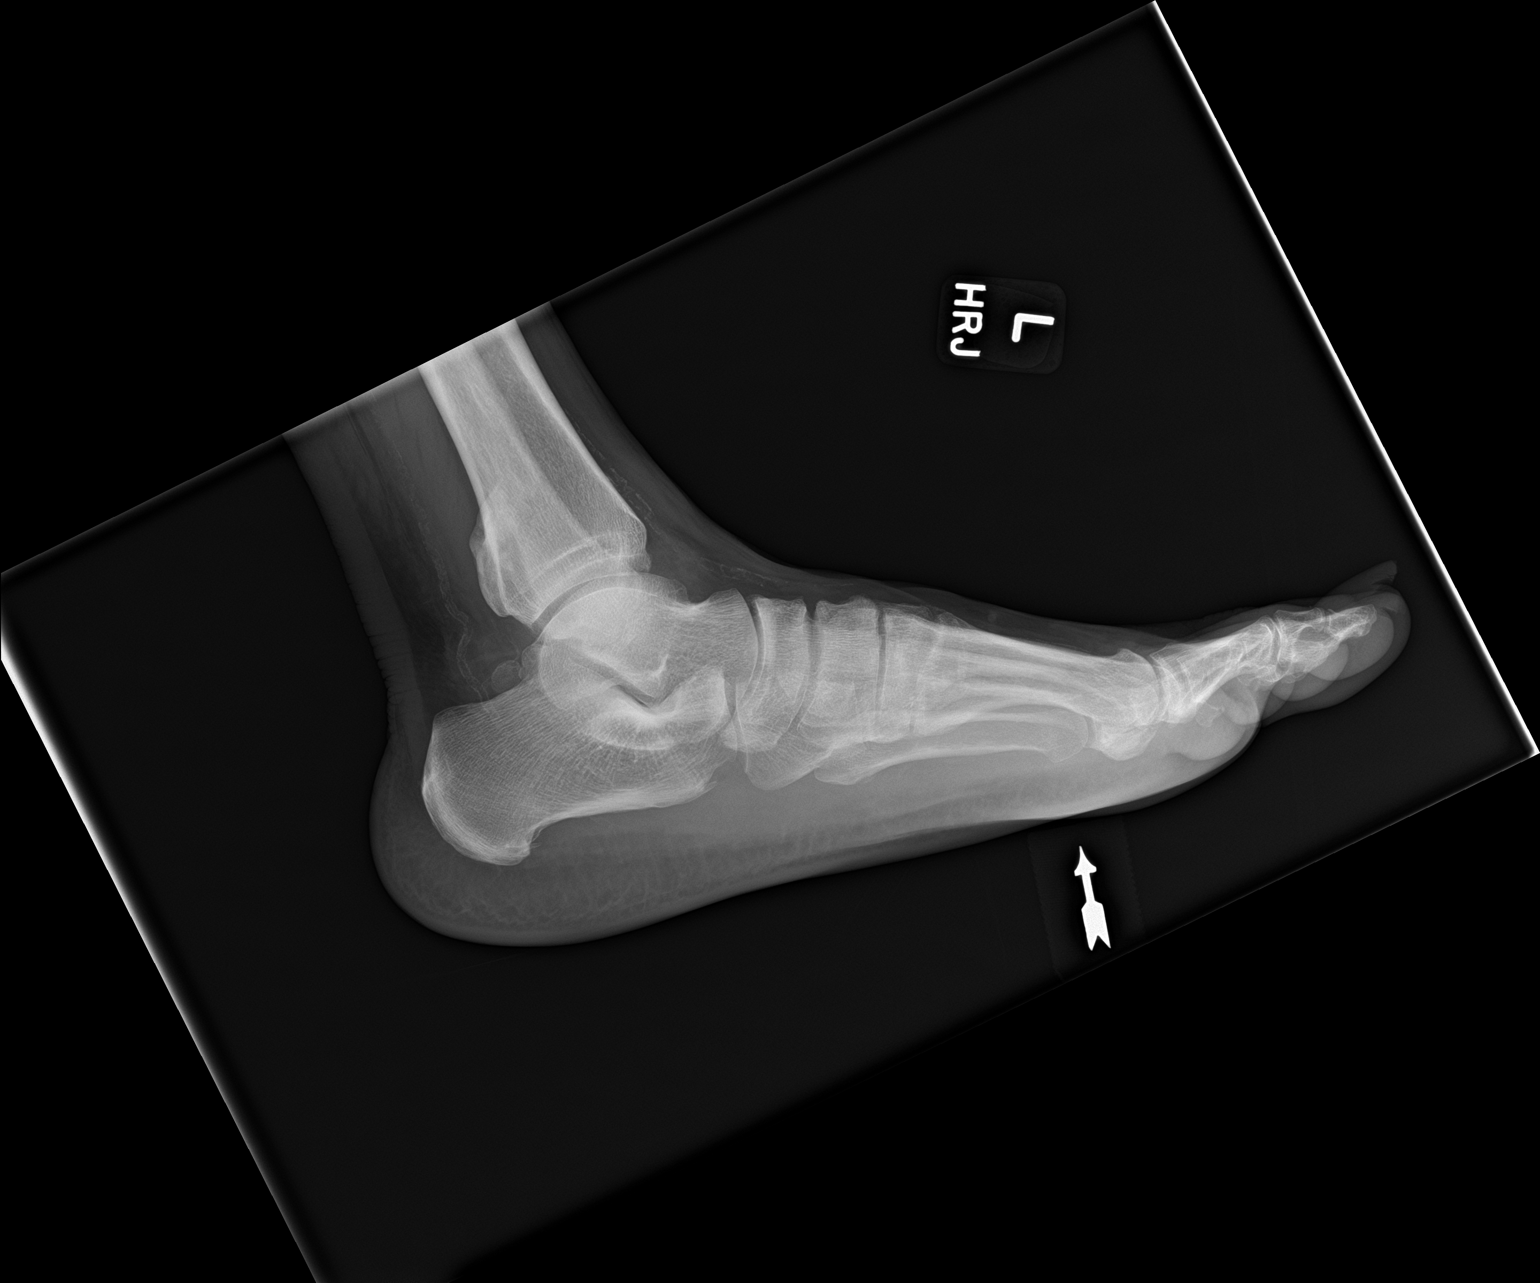

[3 of 3 positions shown; findings below may reference images not displayed]

FINDINGS: Frontal, oblique, and lateral views were obtained. No radiopaque
foreign body or soft tissue air evident. No fracture or dislocation.
There is osteoarthritic change in the first MTP and IP joints. Other
joint spaces appear normal. No erosive change or bony destruction.
There is a benign small exostosis arising from the dorsal distal
talus.

There is extensive arterial vascular calcification.
IMPRESSION: No radiopaque foreign body or soft tissue air. No bony destruction
or erosion. No fracture or dislocation. Osteoarthritic change in the
first MTP and IP joints. Extensive arterial vascular calcification
noted. This is a finding that may be seen with diabetes mellitus.

## 2019-03-12 MED ORDER — TETANUS-DIPHTH-ACELL PERTUSSIS 5-2.5-18.5 LF-MCG/0.5 IM SUSP
0.5000 mL | Freq: Once | INTRAMUSCULAR | Status: AC
  Administered 2019-03-12: 0.5 mL via INTRAMUSCULAR
  Filled 2019-03-12: qty 0.5

## 2019-03-12 MED ORDER — NAPROXEN 375 MG PO TABS: 375.0000 mg | ORAL_TABLET | Freq: Two times a day (BID) | ORAL | 0 refills | Status: DC

## 2019-03-12 NOTE — ED Notes (Signed)
Patient transported to X-ray 

## 2019-03-12 NOTE — ED Provider Notes (Signed)
[facility] EMERGENCY DEPARTMENT Provider Note   CSN: [id number] Arrival date & time: 03/12/19  0705     History   Chief Complaint Chief Complaint  Patient presents with  . Puncture Wound    HPI Ryan Hughes is a 53 y.o. male history of diabetes otherwise healthy presents today after stepping on a nail at work yesterday.  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  Patient reports pain since that time, moderate sharp constant worse with ambulation and without alleviating factors, no medication prior to arrival.  Patient denies any bleeding and cannot find a puncture wound on his foot but reports he feels something painful in his foot.  He denies any other problems today.  Of note patient is unsure of last Tdap.  Denies fever/chills, erythema, swelling, numbness/tingling, weakness, color change or any additional concerns today.    HPI  Past Medical History:  Diagnosis Date  . Diabetes mellitus without complication ([place])     There are no active problems to display for this patient.   No past surgical history on file.      Home Medications    Prior to Admission medications   Medication Sig Start Date End Date Taking? Authorizing Provider  docusate sodium (COLACE) 100 MG capsule Take 1 capsule (100 mg total) by mouth every 12 (twelve) hours. Patient not taking: Reported on 03/12/2019 04/15/18   Rodell Perna A, PA-C  metFORMIN (GLUCOPHAGE) 1000 MG tablet Take 0.5 tablets (500 mg total) by mouth 2 (two) times daily with a meal. Patient not taking: Reported on 03/12/2019 09/17/18   Hedges, Dellis Filbert, PA-C  naproxen (NAPROSYN) 375 MG tablet Take 1 tablet (375 mg total) by mouth 2 (two) times daily. 03/12/19   Deliah Boston, PA-C    Family History No family history on file.  Social History Social History   Tobacco Use  . Smoking status: Current Every  Day Smoker    Packs/day: 1.00    Types: Cigarettes  . Smokeless tobacco: Never Used  Substance Use Topics  . Alcohol use: Yes  . Drug use: Yes    Types: Marijuana     Allergies   Patient has no known allergies.   Review of Systems Review of Systems Ten systems are reviewed and are negative for acute change except as noted in the HPI   Physical Exam Updated Vital Signs BP (!) 162/92   Pulse 85   Temp 98.6 F (37 C) (Oral)   Resp 18   SpO2 97%   Physical Exam Constitutional:      General: He is not in acute distress.    Appearance: Normal appearance. He is well-developed. He is not ill-appearing or diaphoretic.  HENT:     Head: Normocephalic and atraumatic.     Right Ear: External ear normal.     Left Ear: External ear normal.     Nose: Nose normal.  Eyes:     General: Vision grossly intact. Gaze aligned appropriately.     Pupils: Pupils are equal, round, and reactive to light.  Neck:     Musculoskeletal: Normal range of motion.     Trachea: Trachea and phonation normal. No tracheal deviation.  Cardiovascular:     Pulses:          Dorsalis pedis pulses are 2+ on the right side and 2+ on the left side.  Posterior tibial pulses are 2+ on the right side and 2+ on the left side.  Pulmonary:     Effort: Pulmonary effort is normal. No respiratory distress.  Abdominal:     General: There is no distension.     Palpations: Abdomen is soft.     Tenderness: There is no abdominal tenderness. There is no guarding or rebound.  Musculoskeletal: Normal range of motion.       Feet:  Feet:     Right foot:     Protective Sensation: 5 sites tested. 5 sites sensed.     Skin integrity: Skin integrity normal.     Left foot:     Protective Sensation: 5 sites tested. 5 sites sensed.     Skin integrity: Skin integrity normal.     Comments: Area of pain as indicated to the left midfoot.  No skin break, color change, erythema, fluctuance, induration or swelling.  Aside from  thick toenails normal-appearing foot. Skin:    General: Skin is warm and dry.  Neurological:     Mental Status: He is alert.     GCS: GCS eye subscore is 4. GCS verbal subscore is 5. GCS motor subscore is 6.     Comments: Speech is clear and goal oriented, follows commands Major Cranial nerves without deficit, no facial droop Moves extremities without ataxia, coordination intact  Psychiatric:        Behavior: Behavior normal.    ED Treatments / Results  Labs (all labs ordered are listed, but only abnormal results are displayed) Labs Reviewed - No data to display  EKG None  Radiology Dg Foot Complete Left  Result Date: 03/12/2019 CLINICAL DATA:  Pain after stepping on nail. History of diabetes mellitus EXAM: LEFT FOOT - COMPLETE 3+ VIEW COMPARISON:  None. FINDINGS: Frontal, oblique, and lateral views were obtained. No radiopaque foreign body or soft tissue air evident. No fracture or dislocation. There is osteoarthritic change in the first MTP and IP joints. Other joint spaces appear normal. No erosive change or bony destruction. There is a benign small exostosis arising from the dorsal distal talus. There is extensive arterial vascular calcification. IMPRESSION: No radiopaque foreign body or soft tissue air. No bony destruction or erosion. No fracture or dislocation. 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 Extensive arterial vascular calcification noted. This is a finding that may be seen with diabetes mellitus. Electronically Signed   By: Bretta Bang III M.D.   On: 03/12/2019 07:49    Procedures Procedures (including critical care time)  Medications Ordered in ED Medications  Tdap (BOOSTRIX) injection 0.5 mL (0.5 mLs Intramuscular Given 03/12/19 0756)     Initial Impression / Assessment and Plan / ED Course  I have reviewed the triage vital signs and the nursing notes.  Pertinent labs & imaging results that were available during my care of the patient were  reviewed by me and considered in my medical decision making (see chart for details).    DG Left Foot:  IMPRESSION:  No radiopaque foreign body or soft tissue air. No bony destruction  or erosion. No fracture or dislocation. Osteoarthritic change in the  first MTP and IP joints. Extensive arterial vascular calcification  noted. This is a finding that may be seen with diabetes mellitus.  - Patient neurovascular intact bilateral lower extremities with good pedal pulses, capillary refill and sensation to all toes.  No sign of DVT, cellulitis, septic arthritis, compartment syndrome or other emergent pathologies.  Case discussed  with Dr. Rush Landmarkegeler, as patient without any evidence of skin break do not feel that antibiotics are indicated at this time and giving patient would only induce antibiotic resistance and diarrhea.  I had a long discussion with the patient about indications for antibiotics and shared decision-making was made, patient wishes to proceed with outpatient follow-up and recheck later on this week and symptomatic therapies for pain.  I have encouraged him that if he develops any signs of infection to return to the emergency department immediately and that even if he does not develop any symptoms that he should follow-up for recheck with his primary care provider at the end of this week.  Additionally patient had osteoarthritic changes of the first MTP and IP joints, this is not the patient's area of pain I have low suspicion for gout or inflammatory processes at this time.  Patient informed of incidental findings including arterial vascular calcification and to follow-up with PCP.  Patient will use rice therapy and OTC anti-inflammatories to help with his symptoms, additionally will prescribe Naproxen.  At this time there does not appear to be any evidence of an acute emergency medical condition and the patient appears stable for discharge with appropriate outpatient follow up. Diagnosis was  discussed with patient who verbalizes understanding of care plan and is agreeable to discharge. I have discussed return precautions with patient who verbalizes understanding of return precautions. Patient encouraged to follow-up with their PCP. All questions answered.  Patient's case discussed with Dr. Rush Landmarkegeler who agrees with plan to discharge with follow-up.   Note: Portions of this report may have been transcribed using voice recognition software. Every effort was made to ensure accuracy; however, inadvertent computerized transcription errors may still be present. Final Clinical Impressions(s) / ED Diagnoses   Final diagnoses:  Left foot pain    ED Discharge Orders         Ordered    naproxen (NAPROSYN) 375 MG tablet  2 times daily     03/12/19 0930           Elizabeth PalauMorelli, Hiroto Saltzman A, PA-C 03/12/19 0932    Tegeler, Canary Brimhristopher J, MD 03/12/19 1406

## 2019-03-12 NOTE — Progress Notes (Signed)
Orthopedic Tech Progress Note Patient Details:  Ryan Hughes Sep 25, 1965 [id number]  Ortho Devices Type of Ortho Device: Crutches, Postop shoe/boot Ortho Device/Splint Location: LLE Ortho Device/Splint Interventions: Adjustment, Application, Ordered   Post Interventions Patient Tolerated: Ambulated well Instructions Provided: Poper ambulation with device, Care of device, Adjustment of device   Janit Pagan 03/12/2019, 8:59 AM

## 2019-03-12 NOTE — ED Notes (Signed)
Ortho tech at bedside 

## 2019-03-12 NOTE — ED Notes (Signed)
Patient verbalized understanding of dc instructions, vss, ambulatory with nad.   

## 2019-03-12 NOTE — ED Notes (Signed)
Ortho tech made aware of need for post op shoe and crutches.  °

## 2019-03-12 NOTE — Discharge Instructions (Addendum)
You have been diagnosed today with left foot pain.  At this time there does not appear to be the presence of an emergent medical condition, however there is always the potential for conditions to change. Please read and follow the below instructions.  Please return to the Emergency Department immediately for any new or worsening symptoms. Please be sure to follow up with your Primary Care Provider within one week regarding your visit today; please call their office to schedule an appointment even if you are feeling better for a follow-up visit. Your x-ray today showed osteoarthritic changes of your first MTP and IP joints in addition to extensive arterial vascular calcification.  Please discuss these findings with your primary care provider at your next visit.  Please schedule a recheck of your foot with your primary care provider at the end of this week to ensure improvement of symptoms.  Return to the emergency department immediately if you develop any signs of infection. You may use the medication naproxen as prescribed to help with your symptoms.  Please drink plenty of water and take this medication with food.  Do not take any other NSAID-containing medications while taking naproxen.  Get help right away if: Your foot is numb or tingling. Your foot or toes are swollen. Your foot or toes turn white or blue. You have warmth and redness along your foot. You have drainage You have worsening pain You have fever or chills You have any new/concerning or worsening symptoms  Please read the additional information packets attached to your discharge summary.  Do not take your medicine if  develop an itchy rash, swelling in your mouth or lips, or difficulty breathing; call 911 and seek immediate emergency medical attention if this occurs.  Note: Portions of this text may have been transcribed using voice recognition software. Every effort was made to ensure accuracy; however, inadvertent computerized  transcription errors may still be present.

## 2019-03-12 NOTE — ED Triage Notes (Signed)
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 Unsure of last tetanus shot. No obvious wounds present.

## 2019-03-17 ENCOUNTER — Other Ambulatory Visit: Payer: Self-pay

## 2019-03-17 ENCOUNTER — Encounter (HOSPITAL_COMMUNITY): Payer: Self-pay

## 2019-03-17 ENCOUNTER — Emergency Department (HOSPITAL_COMMUNITY): Payer: No Typology Code available for payment source

## 2019-03-17 ENCOUNTER — Emergency Department (HOSPITAL_COMMUNITY)
Admission: EM | Admit: 2019-03-17 | Discharge: 2019-03-17 | Disposition: A | Discharge status: Home / Self Care | Payer: No Typology Code available for payment source | Attending: Emergency Medicine | Admitting: Emergency Medicine

## 2019-03-17 DIAGNOSIS — Y93I9 Activity, other involving external motion: Secondary | ICD-10-CM | POA: Insufficient documentation

## 2019-03-17 DIAGNOSIS — K6289 Other specified diseases of anus and rectum: Secondary | ICD-10-CM

## 2019-03-17 DIAGNOSIS — Y999 Unspecified external cause status: Secondary | ICD-10-CM | POA: Diagnosis not present

## 2019-03-17 DIAGNOSIS — Y92410 Unspecified street and highway as the place of occurrence of the external cause: Secondary | ICD-10-CM | POA: Diagnosis not present

## 2019-03-17 DIAGNOSIS — K625 Hemorrhage of anus and rectum: Secondary | ICD-10-CM

## 2019-03-17 DIAGNOSIS — E119 Type 2 diabetes mellitus without complications: Secondary | ICD-10-CM | POA: Diagnosis not present

## 2019-03-17 DIAGNOSIS — F17210 Nicotine dependence, cigarettes, uncomplicated: Secondary | ICD-10-CM | POA: Insufficient documentation

## 2019-03-17 DIAGNOSIS — R109 Unspecified abdominal pain: Secondary | ICD-10-CM | POA: Insufficient documentation

## 2019-03-17 DIAGNOSIS — M545 Low back pain: Secondary | ICD-10-CM | POA: Diagnosis not present

## 2019-03-17 LAB — URINALYSIS, ROUTINE W REFLEX MICROSCOPIC
Bacteria, UA: NONE SEEN
Bilirubin Urine: NEGATIVE
Glucose, UA: NEGATIVE mg/dL
Hgb urine dipstick: NEGATIVE
Ketones, ur: NEGATIVE mg/dL
Leukocytes,Ua: NEGATIVE
Nitrite: NEGATIVE
Protein, ur: 30 mg/dL — AB
Specific Gravity, Urine: 1.03 (ref 1.005–1.030)
pH: 5 (ref 5.0–8.0)

## 2019-03-17 LAB — CBC WITH DIFFERENTIAL/PLATELET
Abs Immature Granulocytes: 0.01 10*3/uL (ref 0.00–0.07)
Basophils Absolute: 0 10*3/uL (ref 0.0–0.1)
Basophils Relative: 0 %
Eosinophils Absolute: 0.1 10*3/uL (ref 0.0–0.5)
Eosinophils Relative: 1 %
HCT: 45.4 % (ref 39.0–52.0)
Hemoglobin: 15.1 g/dL (ref 13.0–17.0)
Immature Granulocytes: 0 %
Lymphocytes Relative: 28 %
Lymphs Abs: 2.3 10*3/uL (ref 0.7–4.0)
MCH: 31.2 pg (ref 26.0–34.0)
MCHC: 33.3 g/dL (ref 30.0–36.0)
MCV: 93.8 fL (ref 80.0–100.0)
Monocytes Absolute: 0.6 10*3/uL (ref 0.1–1.0)
Monocytes Relative: 7 %
Neutro Abs: 5.3 10*3/uL (ref 1.7–7.7)
Neutrophils Relative %: 64 %
Platelets: 240 10*3/uL (ref 150–400)
RBC: 4.84 MIL/uL (ref 4.22–5.81)
RDW: 13.8 % (ref 11.5–15.5)
WBC: 8.3 10*3/uL (ref 4.0–10.5)
nRBC: 0 % (ref 0.0–0.2)

## 2019-03-17 LAB — BASIC METABOLIC PANEL
Anion gap: 8 (ref 5–15)
BUN: 19 mg/dL (ref 6–20)
CO2: 23 mmol/L (ref 22–32)
Calcium: 8.8 mg/dL — ABNORMAL LOW (ref 8.9–10.3)
Chloride: 110 mmol/L (ref 98–111)
Creatinine, Ser: 0.94 mg/dL (ref 0.61–1.24)
GFR calc Af Amer: >60 mL/min (ref >60)
GFR calc non Af Amer: >60 mL/min (ref >60)
Glucose, Bld: 150 mg/dL — ABNORMAL HIGH (ref 70–99)
Potassium: 4.7 mmol/L (ref 3.5–5.1)
Sodium: 141 mmol/L (ref 135–145)

## 2019-03-17 LAB — CBG MONITORING, ED: Glucose-Capillary: 122 mg/dL — ABNORMAL HIGH (ref 70–99)

## 2019-03-17 LAB — POC OCCULT BLOOD, ED: Fecal Occult Bld: POSITIVE — AB
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
## 2019-03-17 MED ORDER — IOHEXOL 300 MG/ML  SOLN
100.0000 mL | Freq: Once | INTRAMUSCULAR | Status: AC | PRN
  Administered 2019-03-17: 10:00:00 100 mL via INTRAVENOUS

## 2019-03-17 MED ORDER — HYDROCODONE-ACETAMINOPHEN 5-325 MG PO TABS
1.0000 | ORAL_TABLET | Freq: Once | ORAL | Status: AC
  Administered 2019-03-17: 08:00:00 1 via ORAL
  Filled 2019-03-17: qty 1

## 2019-03-17 MED ORDER — CLINDAMYCIN HCL 300 MG PO CAPS: 300.0000 mg | ORAL_CAPSULE | Freq: Three times a day (TID) | ORAL | 0 refills | Status: AC

## 2019-03-17 MED ORDER — SODIUM CHLORIDE 0.9 % IV BOLUS
1000.0000 mL | Freq: Once | INTRAVENOUS | Status: AC
  Administered 2019-03-17: 09:00:00 1000 mL via INTRAVENOUS
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
## 2019-03-17 NOTE — ED Notes (Signed)
Patient transported to X-ray 
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
## 2019-03-17 NOTE — Discharge Instructions (Signed)
Follow up with general surgery for your rectal pain.  Please establish care with a PCP I referred you wanted discharge.  Have also given antibiotics for possible rectal infection given you left prior to your CT scan being resulted.  Turn for any new worsening symptoms.
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
## 2019-03-17 NOTE — ED Notes (Signed)
Patient transported to CT 

## 2020-04-24 ENCOUNTER — Other Ambulatory Visit: Payer: Self-pay

## 2020-04-24 ENCOUNTER — Emergency Department (HOSPITAL_COMMUNITY): Payer: Self-pay

## 2020-04-24 ENCOUNTER — Encounter (HOSPITAL_COMMUNITY): Payer: Self-pay

## 2020-04-24 ENCOUNTER — Inpatient Hospital Stay (HOSPITAL_COMMUNITY)
Admission: EM | Admit: 2020-04-24 | Discharge: 2020-04-27 | DRG: 065 | Disposition: A | Discharge status: Home / Self Care | Payer: Self-pay | Attending: Internal Medicine | Admitting: Internal Medicine

## 2020-04-24 DIAGNOSIS — E1142 Type 2 diabetes mellitus with diabetic polyneuropathy: Secondary | ICD-10-CM | POA: Diagnosis present

## 2020-04-24 DIAGNOSIS — I6381 Other cerebral infarction due to occlusion or stenosis of small artery: Principal | ICD-10-CM | POA: Diagnosis present

## 2020-04-24 DIAGNOSIS — E139 Other specified diabetes mellitus without complications: Secondary | ICD-10-CM

## 2020-04-24 DIAGNOSIS — Z20822 Contact with and (suspected) exposure to covid-19: Secondary | ICD-10-CM | POA: Diagnosis present

## 2020-04-24 DIAGNOSIS — I10 Essential (primary) hypertension: Secondary | ICD-10-CM | POA: Diagnosis present

## 2020-04-24 DIAGNOSIS — E669 Obesity, unspecified: Secondary | ICD-10-CM | POA: Diagnosis present

## 2020-04-24 DIAGNOSIS — I639 Cerebral infarction, unspecified: Secondary | ICD-10-CM | POA: Diagnosis present

## 2020-04-24 DIAGNOSIS — Z9119 Patient's noncompliance with other medical treatment and regimen: Secondary | ICD-10-CM

## 2020-04-24 DIAGNOSIS — E1165 Type 2 diabetes mellitus with hyperglycemia: Secondary | ICD-10-CM | POA: Diagnosis present

## 2020-04-24 DIAGNOSIS — E1169 Type 2 diabetes mellitus with other specified complication: Secondary | ICD-10-CM | POA: Diagnosis present

## 2020-04-24 DIAGNOSIS — R3912 Poor urinary stream: Secondary | ICD-10-CM | POA: Diagnosis present

## 2020-04-24 DIAGNOSIS — N401 Enlarged prostate with lower urinary tract symptoms: Secondary | ICD-10-CM | POA: Diagnosis present

## 2020-04-24 DIAGNOSIS — R202 Paresthesia of skin: Secondary | ICD-10-CM | POA: Diagnosis present

## 2020-04-24 DIAGNOSIS — E782 Mixed hyperlipidemia: Secondary | ICD-10-CM | POA: Diagnosis present

## 2020-04-24 DIAGNOSIS — Z8673 Personal history of transient ischemic attack (TIA), and cerebral infarction without residual deficits: Secondary | ICD-10-CM | POA: Diagnosis present

## 2020-04-24 DIAGNOSIS — Z6833 Body mass index (BMI) 33.0-33.9, adult: Secondary | ICD-10-CM

## 2020-04-24 DIAGNOSIS — Z823 Family history of stroke: Secondary | ICD-10-CM

## 2020-04-24 DIAGNOSIS — L0232 Furuncle of buttock: Secondary | ICD-10-CM | POA: Diagnosis present

## 2020-04-24 DIAGNOSIS — F17210 Nicotine dependence, cigarettes, uncomplicated: Secondary | ICD-10-CM | POA: Diagnosis present

## 2020-04-24 DIAGNOSIS — R3916 Straining to void: Secondary | ICD-10-CM | POA: Diagnosis present

## 2020-04-24 DIAGNOSIS — Z7289 Other problems related to lifestyle: Secondary | ICD-10-CM

## 2020-04-24 DIAGNOSIS — L03317 Cellulitis of buttock: Secondary | ICD-10-CM | POA: Diagnosis present

## 2020-04-24 DIAGNOSIS — E876 Hypokalemia: Secondary | ICD-10-CM | POA: Diagnosis present

## 2020-04-24 LAB — COMPREHENSIVE METABOLIC PANEL
ALT: 13 U/L (ref 0–44)
AST: 16 U/L (ref 15–41)
Albumin: 3.8 g/dL (ref 3.5–5.0)
Alkaline Phosphatase: 73 U/L (ref 38–126)
Anion gap: 11 (ref 5–15)
BUN: 11 mg/dL (ref 6–20)
CO2: 26 mmol/L (ref 22–32)
Calcium: 8.8 mg/dL — ABNORMAL LOW (ref 8.9–10.3)
Chloride: 98 mmol/L (ref 98–111)
Creatinine, Ser: 1.01 mg/dL (ref 0.61–1.24)
GFR, Estimated: >60 mL/min (ref >60)
Glucose, Bld: 389 mg/dL — ABNORMAL HIGH (ref 70–99)
Potassium: 3.6 mmol/L (ref 3.5–5.1)
Sodium: 135 mmol/L (ref 135–145)
Total Bilirubin: 0.4 mg/dL (ref 0.3–1.2)
Total Protein: 7 g/dL (ref 6.5–8.1)

## 2020-04-24 LAB — URINALYSIS, ROUTINE W REFLEX MICROSCOPIC
Bacteria, UA: NONE SEEN
Bilirubin Urine: NEGATIVE
Glucose, UA: >=500 mg/dL — AB
Hgb urine dipstick: NEGATIVE
Ketones, ur: NEGATIVE mg/dL
Leukocytes,Ua: NEGATIVE
Nitrite: NEGATIVE
Protein, ur: NEGATIVE mg/dL
Specific Gravity, Urine: 1.03 (ref 1.005–1.030)
pH: 5 (ref 5.0–8.0)

## 2020-04-24 LAB — RESP PANEL BY RT-PCR (FLU A&B, COVID) ARPGX2
Influenza A by PCR: NEGATIVE
Influenza B by PCR: NEGATIVE
SARS Coronavirus 2 by RT PCR: NEGATIVE

## 2020-04-24 LAB — CBC WITH DIFFERENTIAL/PLATELET
Abs Immature Granulocytes: 0.01 10*3/uL (ref 0.00–0.07)
Basophils Absolute: 0 10*3/uL (ref 0.0–0.1)
Basophils Relative: 0 %
Eosinophils Absolute: 0.1 10*3/uL (ref 0.0–0.5)
Eosinophils Relative: 1 %
HCT: 37.9 % — ABNORMAL LOW (ref 39.0–52.0)
Hemoglobin: 12.8 g/dL — ABNORMAL LOW (ref 13.0–17.0)
Immature Granulocytes: 0 %
Lymphocytes Relative: 42 %
Lymphs Abs: 2.7 10*3/uL (ref 0.7–4.0)
MCH: 31.5 pg (ref 26.0–34.0)
MCHC: 33.8 g/dL (ref 30.0–36.0)
MCV: 93.3 fL (ref 80.0–100.0)
Monocytes Absolute: 0.4 10*3/uL (ref 0.1–1.0)
Monocytes Relative: 6 %
Neutro Abs: 3.2 10*3/uL (ref 1.7–7.7)
Neutrophils Relative %: 51 %
Platelets: 225 10*3/uL (ref 150–400)
RBC: 4.06 MIL/uL — ABNORMAL LOW (ref 4.22–5.81)
RDW: 13.2 % (ref 11.5–15.5)
WBC: 6.4 10*3/uL (ref 4.0–10.5)
nRBC: 0 % (ref 0.0–0.2)

## 2020-04-24 LAB — CBG MONITORING, ED
Glucose-Capillary: 393 mg/dL — ABNORMAL HIGH (ref 70–99)
Glucose-Capillary: 177 mg/dL — ABNORMAL HIGH (ref 70–99)

## 2020-04-24 IMAGING — MR MR HEAD W/O CM
11 series · 42 of 48 positions shown · non-contrast
Comparison: None.

CLINICAL DATA: Right hand tingling

EXAM:
MRI HEAD WITHOUT CONTRAST
TECHNIQUE: Multiplanar, multiecho pulse sequences of the brain and surrounding
structures were obtained without intravenous contrast.

[Series 5: dwi_tracew · axial · 3.0mm · 0.92mm/px · z∈[-88,+76]mm · 8 of 112 slices shown]
[im 1/112]
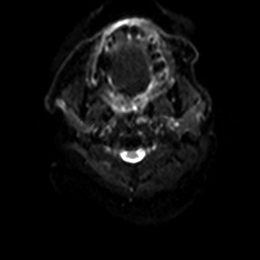
[im 23/112]
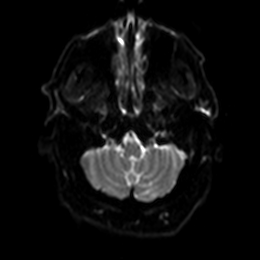
[im 34/112]
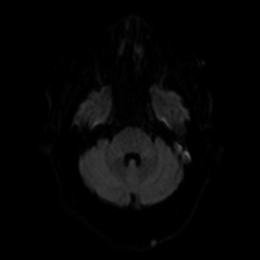
[im 45/112]
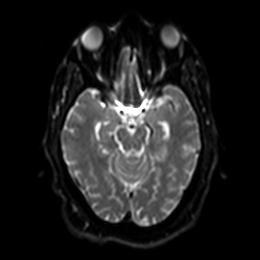
[im 67/112]
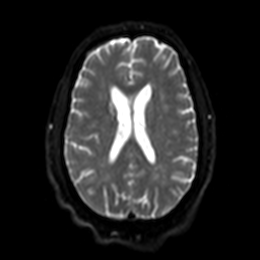
[im 78/112]
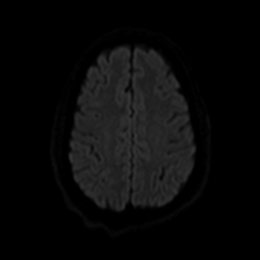
[im 89/112]
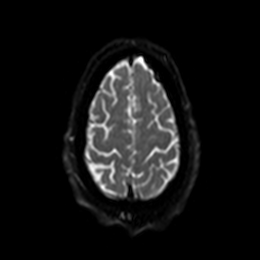
[im 112/112]
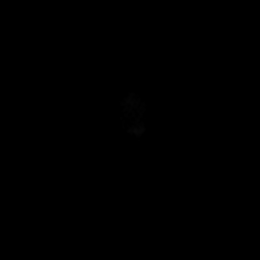

[Series 6: dwi_adc · axial · 3.0mm · 0.92mm/px · z∈[-88,-50]mm · 2 of 55 slices shown]
[im 1/55]
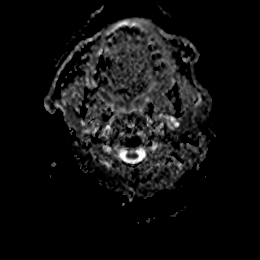
[im 14/55]
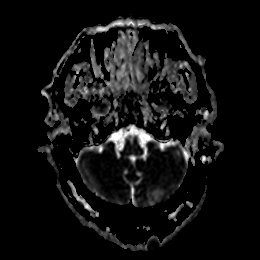

[Series 7: GRE · axial · 4.0mm · 0.47mm/px · z∈[-95,+77]mm · 4 of 38 slices shown (1 of 2)]
[im 1/38]
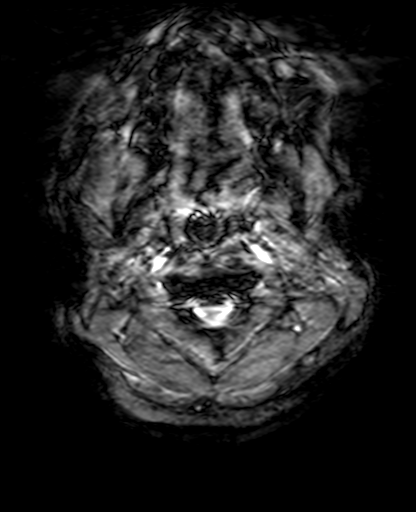
[im 13/38]
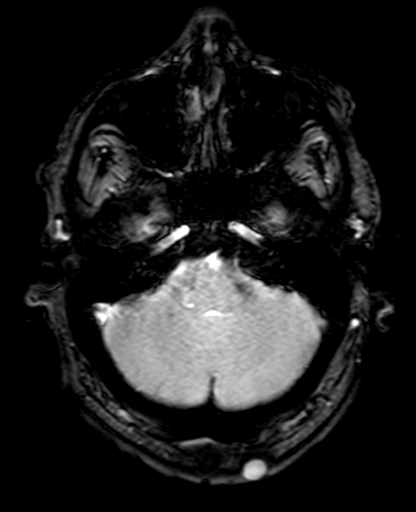
[im 25/38]
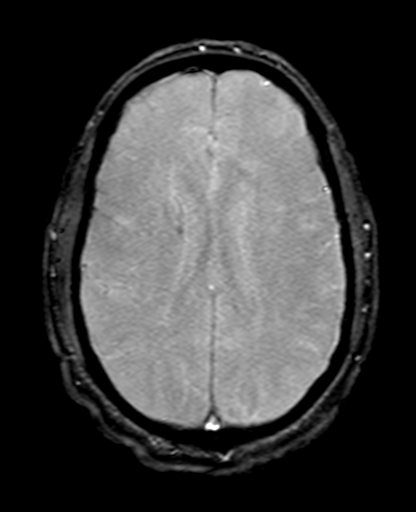
[im 38/38]
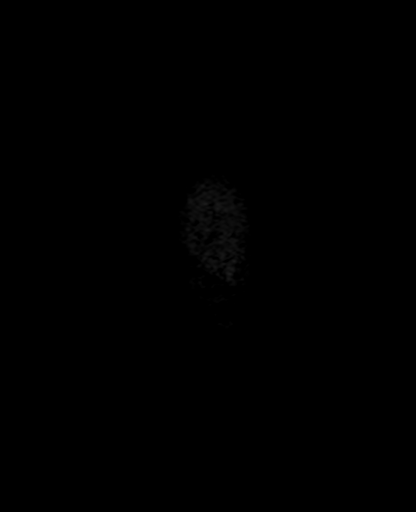

[Series 8: FLAIR · axial · 4.0mm · 0.90mm/px · z∈[-92,+75]mm · 4 of 43 slices shown]
[im 1/43]
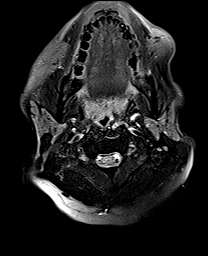
[im 15/43]
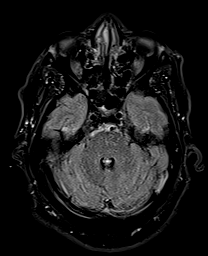
[im 29/43]
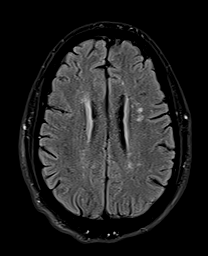
[im 43/43]
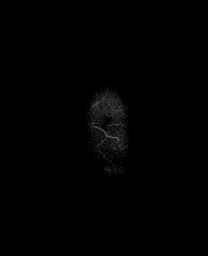

[Series 9: GRE · axial · 4.0mm · 0.47mm/px · z∈[-97,+74]mm · 4 of 44 slices shown (2 of 2)]
[im 1/44]
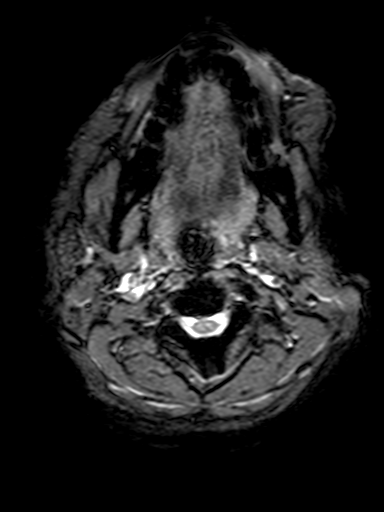
[im 15/44]
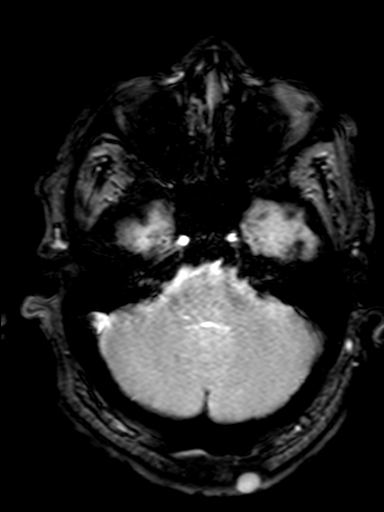
[im 29/44]
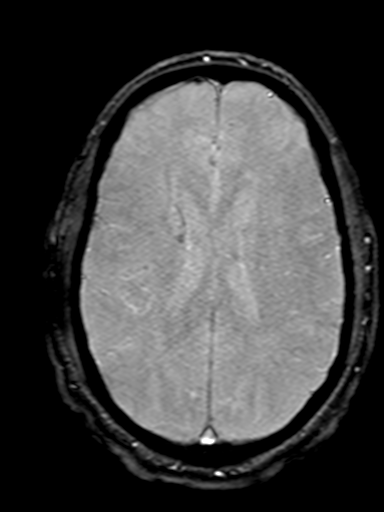
[im 44/44]
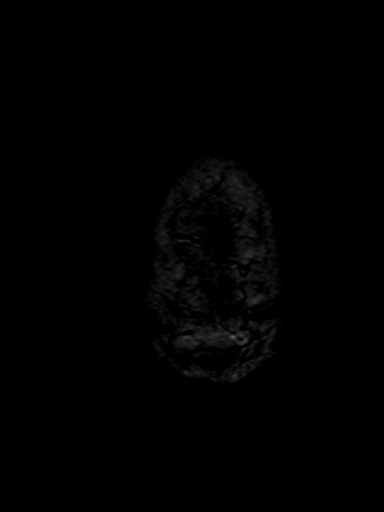

[Series 10: DWI · coronal · 5.0mm · 1.31mm/px · 6 of 64 slices shown (1 of 2)]
[im 1/64]
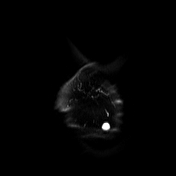
[im 13/64]
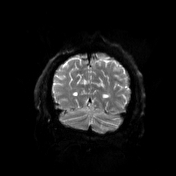
[im 26/64]
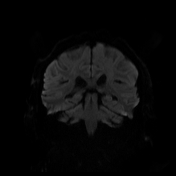
[im 38/64]
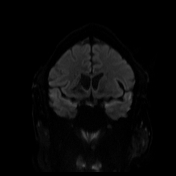
[im 51/64]
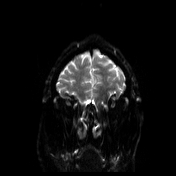
[im 64/64]
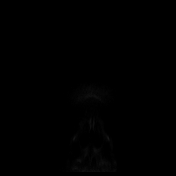

[Series 11: DWI · coronal · 5.0mm · 1.31mm/px · 3 of 32 slices shown (2 of 2)]
[im 1/32]
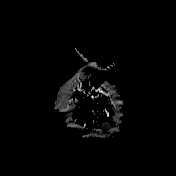
[im 16/32]
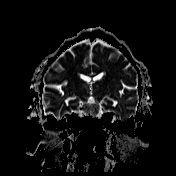
[im 32/32]
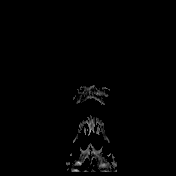

[Series 12: T2 · sagittal · 5.0mm · 0.47mm/px · 2 of 26 slices shown (1 of 3)]
[im 1/26]
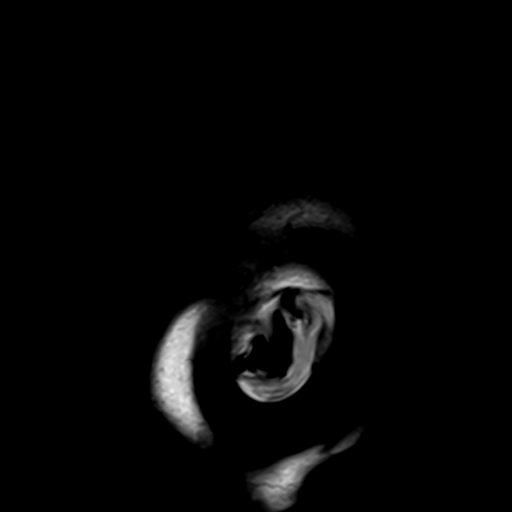
[im 26/26]
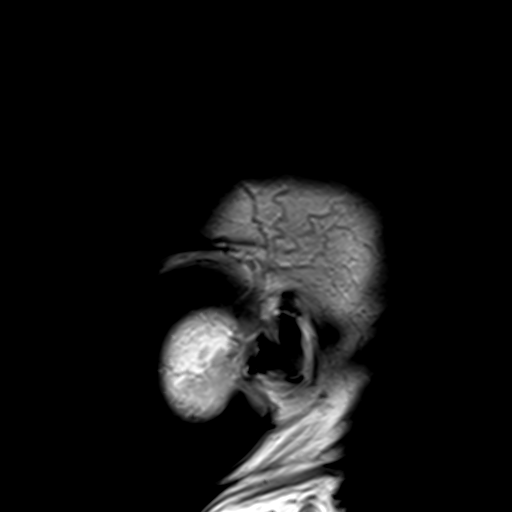

[Series 13: T2 · axial · 5.0mm · 0.45mm/px · z∈[-89,+73]mm · 2 of 26 slices shown (2 of 3)]
[im 1/26]
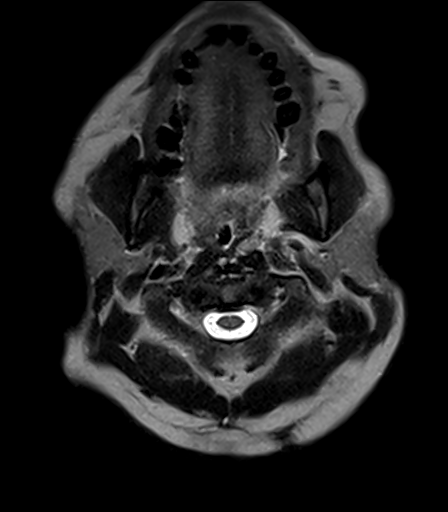
[im 26/26]
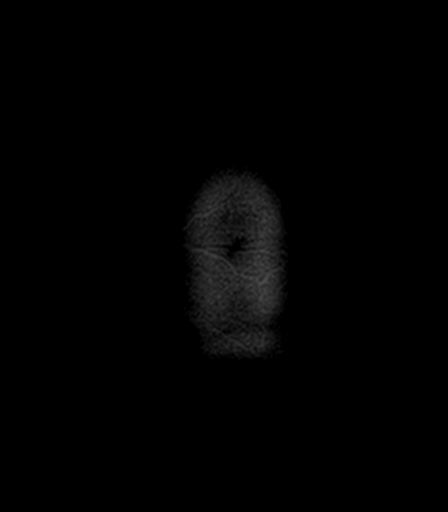

[Series 14: T1 · axial · 4.0mm · 0.45mm/px · z∈[-88,+75]mm · 4 of 42 slices shown]
[im 1/42]
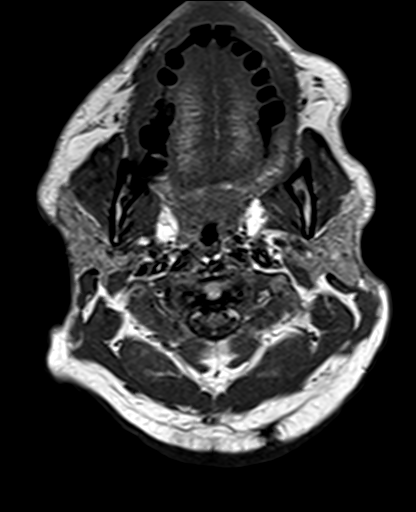
[im 14/42]
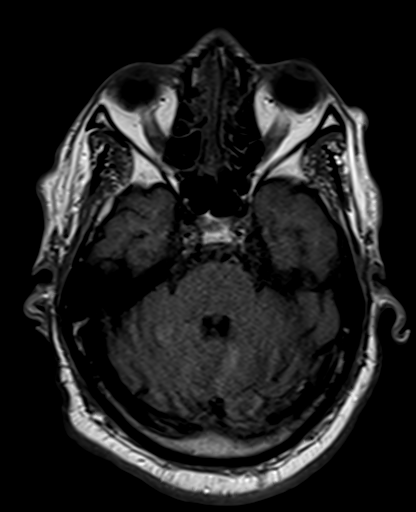
[im 28/42]
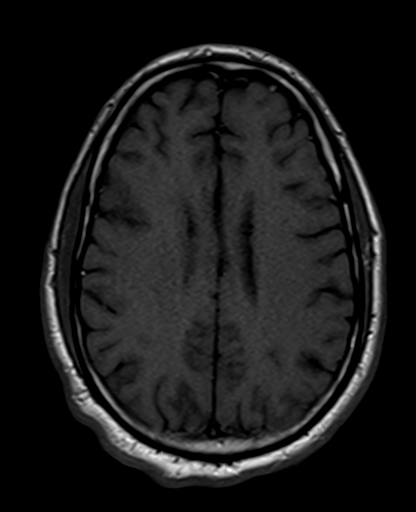
[im 42/42]
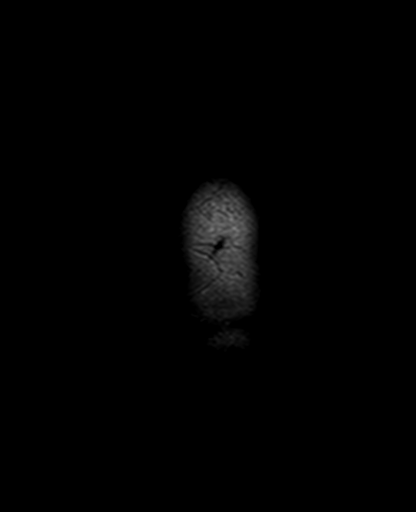

[Series 15: T2 · coronal · 5.0mm · 0.86mm/px · 3 of 32 slices shown (3 of 3)]
[im 1/32]
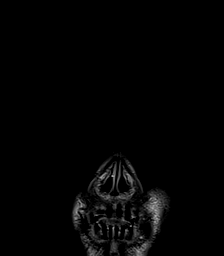
[im 16/32]
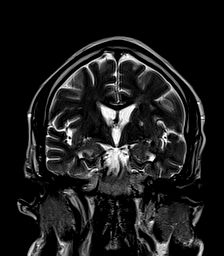
[im 32/32]
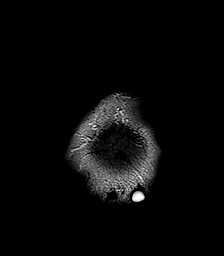

[42 of 48 positions shown; findings below may reference images not displayed]

FINDINGS: Brain: There is a small focus of abnormal diffusion restriction
within the dorsolateral left thalamus. No acute or chronic
hemorrhage. There is multifocal hyperintense T2-weighted signal
within the white matter. Parenchymal volume and CSF spaces are
normal. There is an old right basal ganglia small vessel infarct.

Vascular: Major flow voids are preserved.

Skull and upper cervical spine: Normal calvarium and skull base.
Visualized upper cervical spine and soft tissues are normal.

Sinuses/Orbits:No paranasal sinus fluid levels or advanced mucosal
thickening. Small amount of left mastoid fluid. Normal orbits.
IMPRESSION: 1. Small focus of acute ischemia within the dorsolateral left
thalamus. No hemorrhage or mass effect.
2. Old right basal ganglia small vessel infarct.

## 2020-04-24 IMAGING — DX DG CHEST 1V PORT
1 series · 2 of 2 positions shown · non-contrast
Comparison: None.

CLINICAL DATA: Weakness.

EXAM:
PORTABLE CHEST 1 VIEW

[Series 1: chest ap · 0.14mm/px · 2 of 2 slices shown]
[im 1/2]
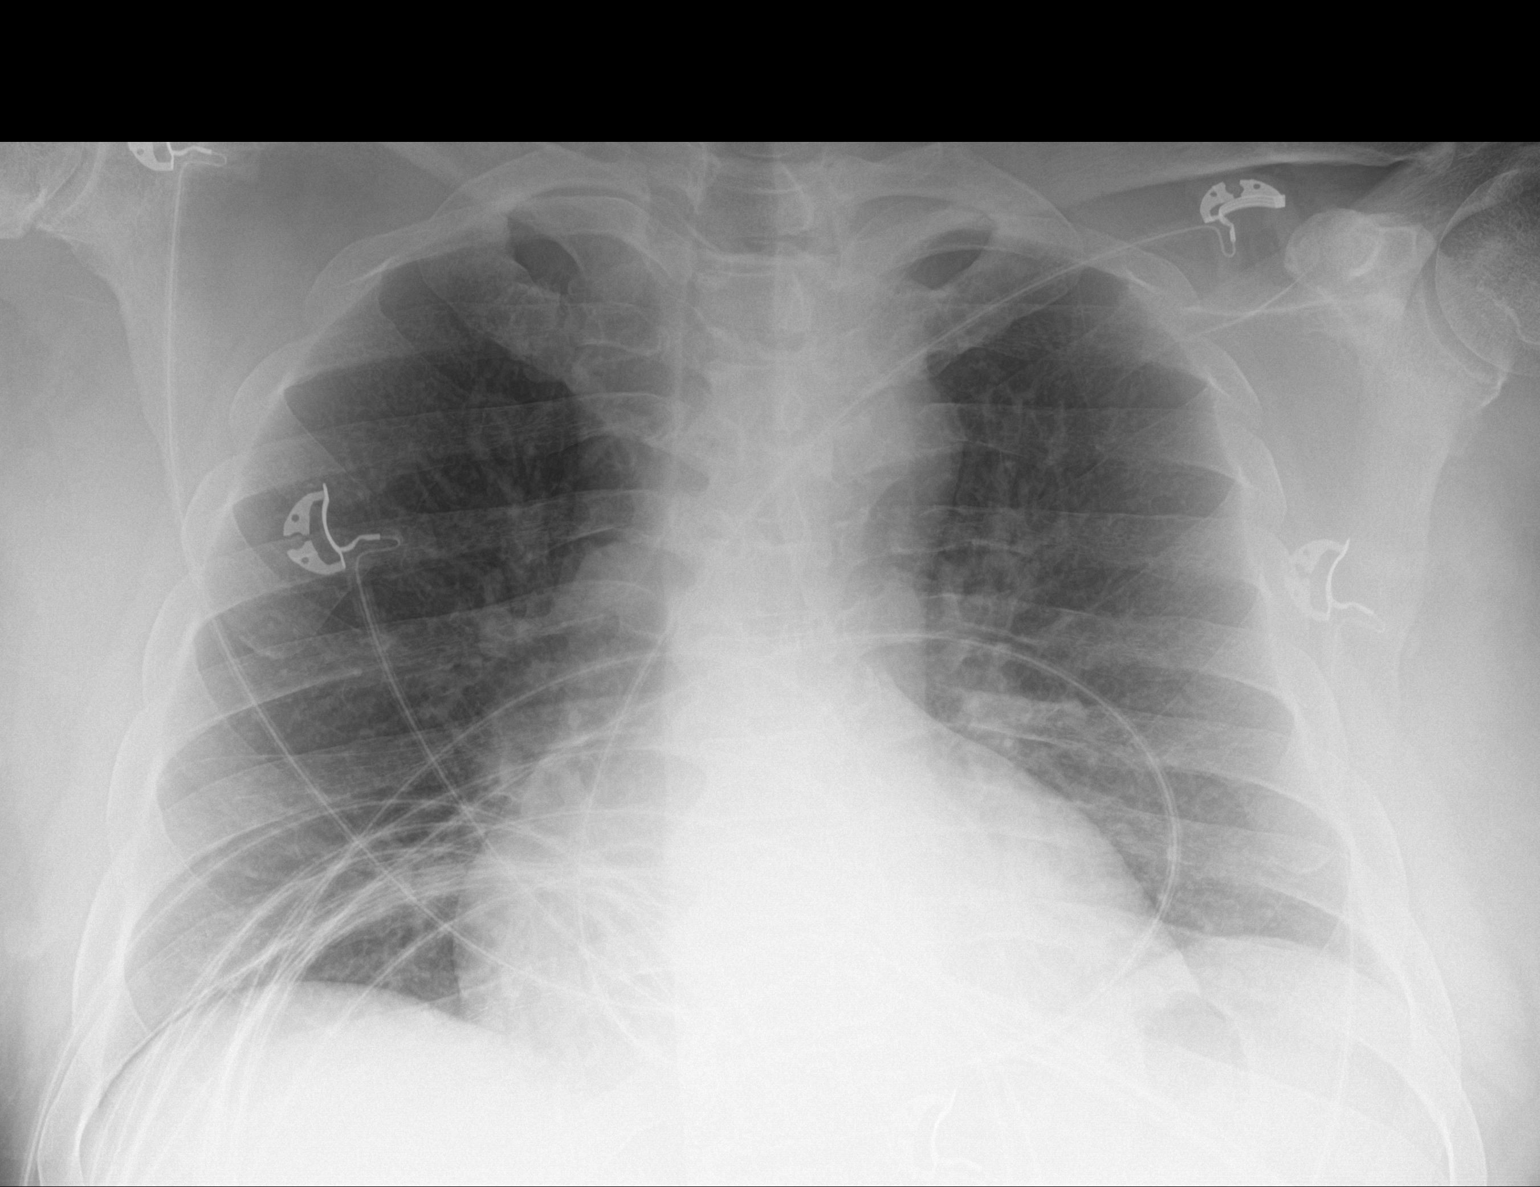
[im 2/2]
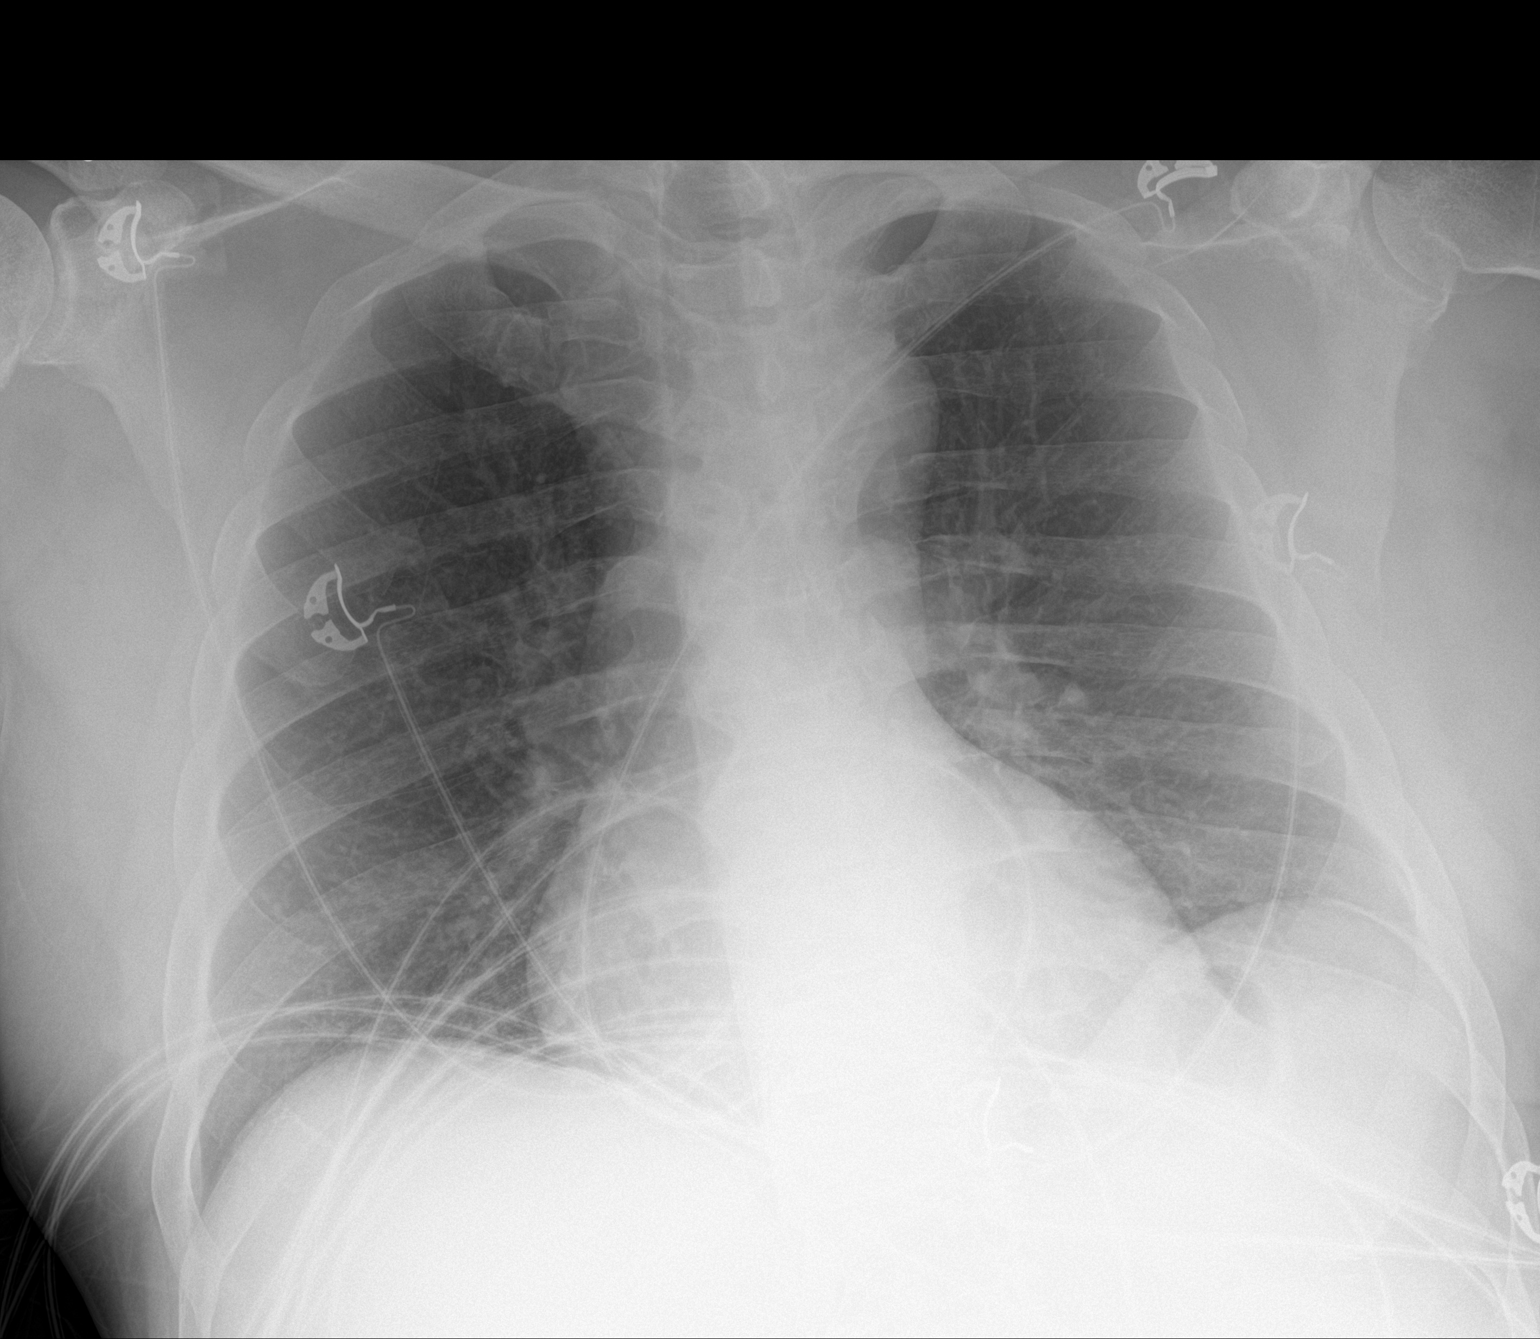

[2 of 2 positions shown; findings below may reference images not displayed]

FINDINGS: Borderline mild cardiomegaly. Normal mediastinal contours. Vascular
congestion without evidence of pulmonary edema. No focal airspace
disease. No pleural fluid or pneumothorax. No acute osseous
abnormalities are seen.
IMPRESSION: Borderline mild cardiomegaly with vascular congestion.

## 2020-04-24 MED ORDER — INSULIN ASPART 100 UNIT/ML ~~LOC~~ SOLN
10.0000 [IU] | Freq: Once | SUBCUTANEOUS | Status: AC
  Administered 2020-04-24: 10 [IU] via SUBCUTANEOUS
  Filled 2020-04-24: qty 0.1

## 2020-04-24 MED ORDER — METFORMIN HCL 500 MG PO TABS: 500.0000 mg | ORAL_TABLET | Freq: Two times a day (BID) | ORAL | 0 refills | Status: DC

## 2020-04-24 MED ORDER — SODIUM CHLORIDE 0.9 % IV BOLUS
1000.0000 mL | Freq: Once | INTRAVENOUS | Status: AC
  Administered 2020-04-24: 1000 mL via INTRAVENOUS

## 2020-04-24 NOTE — ED Notes (Addendum)
Asked pt if he could use the restroom pt stated not at this time

## 2020-04-24 NOTE — Discharge Instructions (Signed)
Follow up with [facility].  Call for an appointment
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
## 2020-04-24 NOTE — ED Provider Notes (Signed)
[facility]-EMERGENCY DEPT Provider Note   CSN: [id number] Arrival date & time: 04/24/20  1643     History Chief Complaint  Patient presents with  . Nausea  . Vomiting  . Tingling    Ryan Hughes is a 54 y.o. male.  Patient complains of nausea and vomiting today with some numbness in his right hand.  Minimal weakness  The history is provided by the patient. No language interpreter was used.  Weakness Severity:  Mild Onset quality:  Sudden Timing:  Constant Progression:  Worsening Chronicity:  New Context: not alcohol use   Relieved by:  Nothing Worsened by:  Nothing Ineffective treatments:  None tried Associated symptoms: no abdominal pain, no chest pain, no cough, no diarrhea, no frequency, no headaches and no seizures        Past Medical History:  Diagnosis Date  . Diabetes mellitus without complication (HCC)     There are no problems to display for this patient.   History reviewed. No pertinent surgical history.     History reviewed. No pertinent family history.  Social History   Tobacco Use  . Smoking status: Current Every Day Smoker    Packs/day: 1.00    Types: Cigarettes  . Smokeless tobacco: Never Used  Substance Use Topics  . Alcohol use: Yes  . Drug use: Yes    Types: Marijuana    Home Medications Prior to Admission medications   Medication Sig Start Date End Date Taking? Authorizing Provider  docusate sodium (COLACE) 100 MG capsule Take 1 capsule (100 mg total) by mouth every 12 (twelve) hours. Patient not taking: Reported on 03/12/2019 04/15/18   Michela Pitcher A, PA-C  metFORMIN (GLUCOPHAGE) 1000 MG tablet Take 0.5 tablets (500 mg total) by mouth 2 (two) times daily with a meal. Patient not taking: Reported on 03/12/2019 09/17/18   Hedges, Tinnie Gens, PA-C  naproxen (NAPROSYN) 375 MG tablet Take 1 tablet (375 mg total) by mouth 2 (two) times daily. 03/12/19   Bill Salinas, PA-C    Allergies    Patient has no known  allergies.  Review of Systems   Review of Systems  Constitutional: Negative for appetite change and fatigue.  HENT: Negative for congestion, ear discharge and sinus pressure.   Eyes: Negative for discharge.  Respiratory: Negative for cough.   Cardiovascular: Negative for chest pain.  Gastrointestinal: Negative for abdominal pain and diarrhea.  Genitourinary: Negative for frequency and hematuria.  Musculoskeletal: Negative for back pain.  Skin: Negative for rash.  Neurological: Positive for weakness. Negative for seizures and headaches.       Numbness  Psychiatric/Behavioral: Negative for hallucinations.    Physical Exam Updated Vital Signs BP (!) 147/102   Pulse 62   Resp 18   SpO2 96%   Physical Exam Vitals and nursing note reviewed.  Constitutional:      Appearance: He is well-developed.  HENT:     Head: Normocephalic.     Nose: Nose normal.  Eyes:     General: No scleral icterus.    Extraocular Movements: EOM normal.     Conjunctiva/sclera: Conjunctivae normal.  Neck:     Thyroid: No thyromegaly.  Cardiovascular:     Rate and Rhythm: Normal rate and regular rhythm.     Heart sounds: No murmur heard. No friction rub. No gallop.   Pulmonary:     Breath sounds: No stridor. No wheezing or rales.  Chest:     Chest wall: No tenderness.  Abdominal:  General: There is no distension.     Tenderness: There is no abdominal tenderness. There is no rebound.  Musculoskeletal:        General: No edema.     Cervical back: Neck supple.     Comments: Mild numbness to right hand.  No weakness or motor function deficit  Lymphadenopathy:     Cervical: No cervical adenopathy.  Skin:    Findings: No erythema or rash.  Neurological:     Mental Status: He is alert and oriented to person, place, and time.     Motor: No abnormal muscle tone.     Coordination: Coordination normal.  Psychiatric:        Mood and Affect: Mood and affect normal.        Behavior: Behavior normal.      ED Results / Procedures / Treatments   Labs (all labs ordered are listed, but only abnormal results are displayed) Labs Reviewed  CBC WITH DIFFERENTIAL/PLATELET - Abnormal; Notable for the following components:      Result Value   RBC 4.06 (*)    Hemoglobin 12.8 (*)    HCT 37.9 (*)    All other components within normal limits  COMPREHENSIVE METABOLIC PANEL - Abnormal; Notable for the following components:   Glucose, Bld 389 (*)    Calcium 8.8 (*)    All other components within normal limits  URINALYSIS, ROUTINE W REFLEX MICROSCOPIC - Abnormal; Notable for the following components:   Glucose, UA >=500 (*)    All other components within normal limits  CBG MONITORING, ED - Abnormal; Notable for the following components:   Glucose-Capillary 393 (*)    All other components within normal limits  CBG MONITORING, ED - Abnormal; Notable for the following components:   Glucose-Capillary 177 (*)    All other components within normal limits  RESP PANEL BY RT-PCR (FLU A&B, COVID) [id number]    EKG None  Radiology DG Chest Port 1 View  Result Date: 04/24/2020 CLINICAL DATA:  Weakness. EXAM: PORTABLE CHEST 1 VIEW COMPARISON:  None. FINDINGS: Borderline mild cardiomegaly. Normal mediastinal contours. Vascular congestion without evidence of pulmonary edema. No focal airspace disease. No pleural fluid or pneumothorax. No acute osseous abnormalities are seen. IMPRESSION: Borderline mild cardiomegaly with vascular congestion. Electronically Signed   By: Narda Rutherford M.D.   On: 04/24/2020 17:39    Procedures Procedures (including critical care time)  Medications Ordered in ED Medications  sodium chloride 0.9 % bolus 1,000 mL (1,000 mLs Intravenous New Bag/Given 04/24/20 1724)  insulin aspart (novoLOG) injection 10 Units (10 Units Subcutaneous Given 04/24/20 1920)    ED Course  I have reviewed the triage vital signs and the nursing notes.  Pertinent labs & imaging results that were  available during my care of the patient were reviewed by me and considered in my medical decision making (see chart for details).    MDM Rules/Calculators/A&P                          Patient with poorly controlled diabetes.  Negative Covid test.   Mri shows new stroke.  Pt will be admitted Final Clinical Impression(s) / ED Diagnoses Final diagnoses:  None    Rx / DC Orders ED Discharge Orders    None       Bethann Berkshire, MD 04/26/20 1034

## 2020-04-25 ENCOUNTER — Observation Stay (HOSPITAL_COMMUNITY): Payer: Self-pay

## 2020-04-25 ENCOUNTER — Encounter (HOSPITAL_COMMUNITY): Payer: Self-pay | Admitting: Internal Medicine

## 2020-04-25 DIAGNOSIS — I6381 Other cerebral infarction due to occlusion or stenosis of small artery: Secondary | ICD-10-CM | POA: Diagnosis present

## 2020-04-25 DIAGNOSIS — R3916 Straining to void: Secondary | ICD-10-CM

## 2020-04-25 DIAGNOSIS — E1165 Type 2 diabetes mellitus with hyperglycemia: Secondary | ICD-10-CM | POA: Diagnosis present

## 2020-04-25 DIAGNOSIS — F17210 Nicotine dependence, cigarettes, uncomplicated: Secondary | ICD-10-CM | POA: Diagnosis present

## 2020-04-25 DIAGNOSIS — N401 Enlarged prostate with lower urinary tract symptoms: Secondary | ICD-10-CM | POA: Diagnosis present

## 2020-04-25 DIAGNOSIS — E782 Mixed hyperlipidemia: Secondary | ICD-10-CM

## 2020-04-25 DIAGNOSIS — I6389 Other cerebral infarction: Secondary | ICD-10-CM

## 2020-04-25 DIAGNOSIS — E1169 Type 2 diabetes mellitus with other specified complication: Secondary | ICD-10-CM | POA: Diagnosis present

## 2020-04-25 DIAGNOSIS — I639 Cerebral infarction, unspecified: Secondary | ICD-10-CM | POA: Diagnosis present

## 2020-04-25 DIAGNOSIS — Z8673 Personal history of transient ischemic attack (TIA), and cerebral infarction without residual deficits: Secondary | ICD-10-CM | POA: Diagnosis present

## 2020-04-25 HISTORY — DX: Other cerebral infarction due to occlusion or stenosis of small artery: I63.81

## 2020-04-25 HISTORY — DX: Nicotine dependence, cigarettes, uncomplicated: F17.210

## 2020-04-25 LAB — COMPREHENSIVE METABOLIC PANEL
ALT: 13 U/L (ref 0–44)
AST: 11 U/L — ABNORMAL LOW (ref 15–41)
Albumin: 3.6 g/dL (ref 3.5–5.0)
Alkaline Phosphatase: 73 U/L (ref 38–126)
Anion gap: 10 (ref 5–15)
BUN: 10 mg/dL (ref 6–20)
CO2: 26 mmol/L (ref 22–32)
Calcium: 8.8 mg/dL — ABNORMAL LOW (ref 8.9–10.3)
Chloride: 100 mmol/L (ref 98–111)
Creatinine, Ser: 0.7 mg/dL (ref 0.61–1.24)
GFR, Estimated: >60 mL/min (ref >60)
Glucose, Bld: 358 mg/dL — ABNORMAL HIGH (ref 70–99)
Potassium: 3.5 mmol/L (ref 3.5–5.1)
Sodium: 136 mmol/L (ref 135–145)
Total Bilirubin: 0.6 mg/dL (ref 0.3–1.2)
Total Protein: 6.4 g/dL — ABNORMAL LOW (ref 6.5–8.1)

## 2020-04-25 LAB — CBG MONITORING, ED
Glucose-Capillary: 390 mg/dL — ABNORMAL HIGH (ref 70–99)
Glucose-Capillary: 256 mg/dL — ABNORMAL HIGH (ref 70–99)
Glucose-Capillary: 193 mg/dL — ABNORMAL HIGH (ref 70–99)

## 2020-04-25 LAB — HEMOGLOBIN A1C
Hgb A1c MFr Bld: 14.2 % — ABNORMAL HIGH (ref 4.8–5.6)
Mean Plasma Glucose: 360.84 mg/dL

## 2020-04-25 LAB — HIV ANTIBODY (ROUTINE TESTING W REFLEX): HIV Screen 4th Generation wRfx: NONREACTIVE

## 2020-04-25 LAB — CBC
HCT: 40.6 % (ref 39.0–52.0)
Hemoglobin: 13.9 g/dL (ref 13.0–17.0)
MCH: 31.6 pg (ref 26.0–34.0)
MCHC: 34.2 g/dL (ref 30.0–36.0)
MCV: 92.3 fL (ref 80.0–100.0)
Platelets: 238 10*3/uL (ref 150–400)
RBC: 4.4 MIL/uL (ref 4.22–5.81)
RDW: 13.2 % (ref 11.5–15.5)
WBC: 6.5 10*3/uL (ref 4.0–10.5)
nRBC: 0 % (ref 0.0–0.2)

## 2020-04-25 LAB — ECHOCARDIOGRAM COMPLETE BUBBLE STUDY
Area-P 1/2: 3.6 cm2
S' Lateral: 3.5 cm

## 2020-04-25 LAB — LIPID PANEL
Cholesterol: 205 mg/dL — ABNORMAL HIGH (ref 0–200)
HDL: 38 mg/dL — ABNORMAL LOW (ref >40)
LDL Cholesterol: 130 mg/dL — ABNORMAL HIGH (ref 0–99)
Total CHOL/HDL Ratio: 5.4 RATIO
Triglycerides: 185 mg/dL — ABNORMAL HIGH (ref <150)
VLDL: 37 mg/dL (ref 0–40)

## 2020-04-25 LAB — GLUCOSE, CAPILLARY: Glucose-Capillary: 214 mg/dL — ABNORMAL HIGH (ref 70–99)

## 2020-04-25 IMAGING — CT CT ANGIO NECK
2 of 8 series · 8 of 33 positions shown · IV contrast (omnipaque)
Comparison: None.

CLINICAL DATA: Right hand tingling

EXAM:
CT ANGIOGRAPHY HEAD AND NECK
TECHNIQUE: Multidetector CT imaging of the head and neck was performed using
the standard protocol during bolus administration of intravenous
contrast. Multiplanar CT image reconstructions and MIPs were
obtained to evaluate the vascular anatomy. Carotid stenosis
measurements (when applicable) are obtained utilizing NASCET
criteria, using the distal internal carotid diameter as the
denominator.
CONTRAST:  100mL OMNIPAQUE IOHEXOL 350 MG/ML SOLN

[Series 1: cta head neck · axial · 0.48mm/px · z∈[-250,-126]mm · 2 of 187 slices shown]
[im 63/187  soft-tissue]
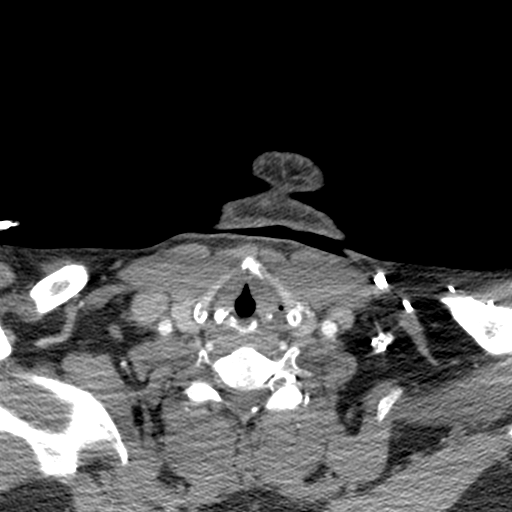
[im 125/187  soft-tissue]
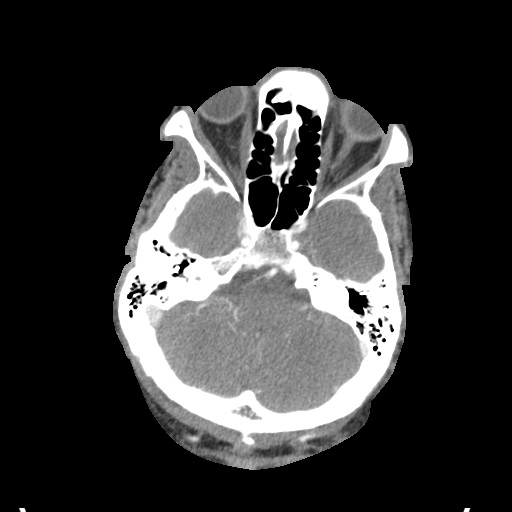

[Series 8: ax thin · axial · 0.38mm/px · z∈[-327,-62]mm · 6 of 373 slices shown]
[im 54/373  soft-tissue]
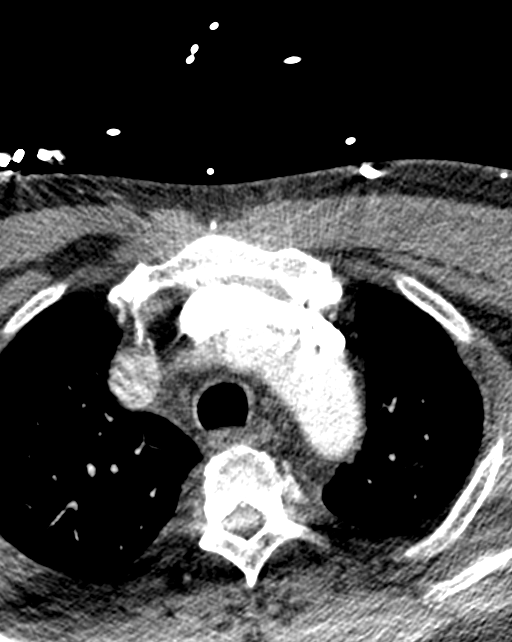
[im 107/373  bone]
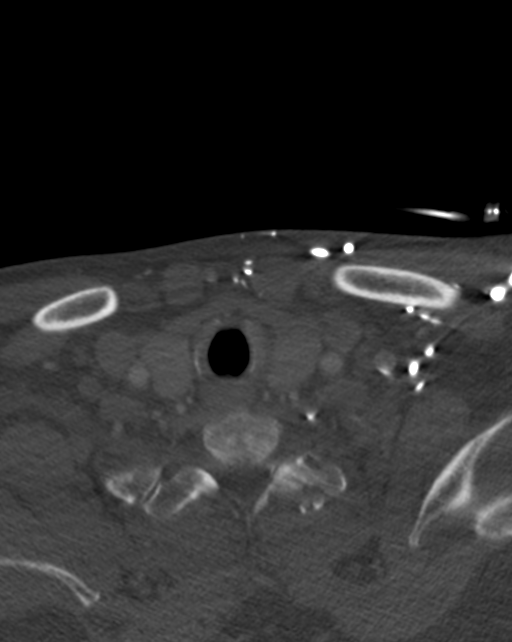
[im 160/373  soft-tissue]
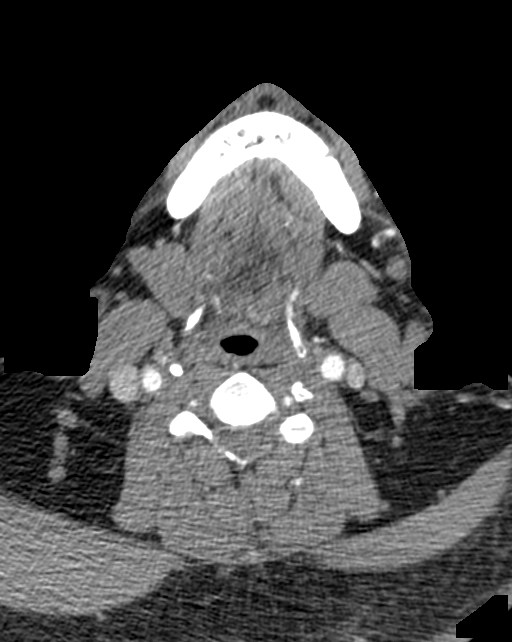
[im 213/373  bone]
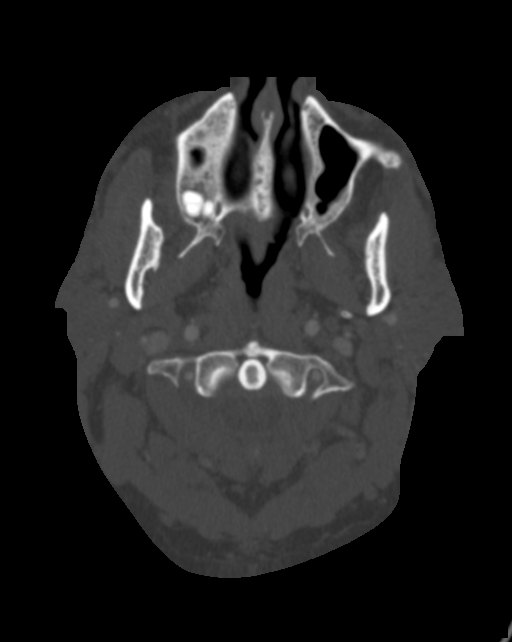
[im 266/373  soft-tissue]
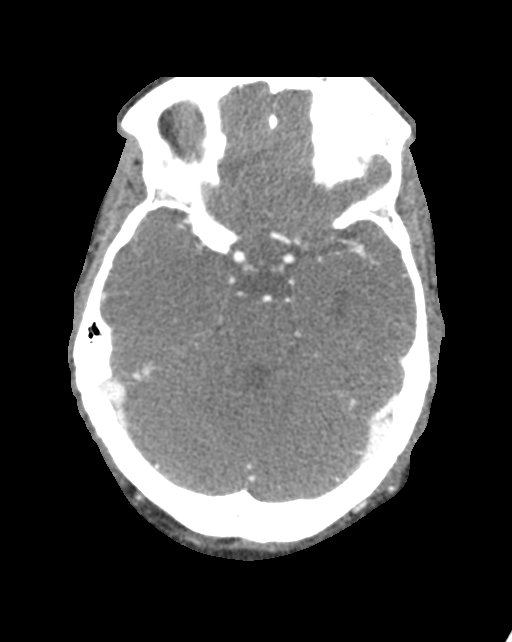
[im 319/373  bone]
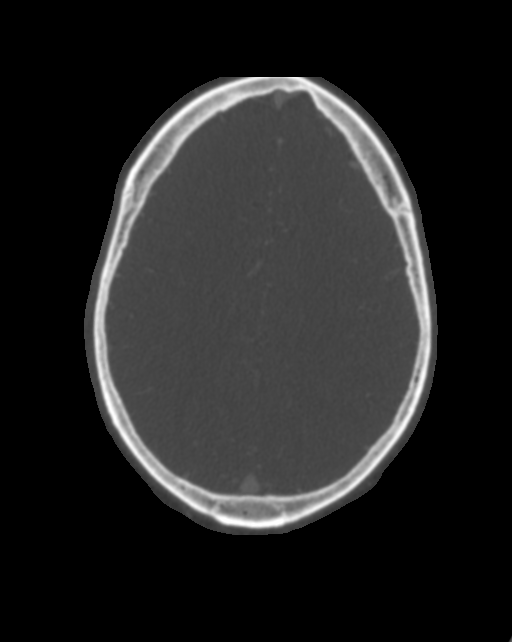

[8 of 33 positions shown; findings below may reference images not displayed]

FINDINGS: CT HEAD FINDINGS

Brain: There is no mass, hemorrhage or extra-axial collection. The
size and configuration of the ventricles and extra-axial CSF spaces
are normal. There is no acute or chronic infarction. The brain
parenchyma is normal.

Skull: The visualized skull base, calvarium and extracranial soft
tissues are normal.

Sinuses/Orbits: No fluid levels or advanced mucosal thickening of
the visualized paranasal sinuses. No mastoid or middle ear effusion.
The orbits are normal.

CTA NECK FINDINGS

SKELETON: There is no bony spinal canal stenosis. No lytic or
blastic lesion.

OTHER NECK: Normal pharynx, larynx and major salivary glands. No
cervical lymphadenopathy. Unremarkable thyroid gland.

UPPER CHEST: No pneumothorax or pleural effusion. No nodules or
masses.

AORTIC ARCH:

There is calcific atherosclerosis of the aortic arch. There is no
aneurysm, dissection or hemodynamically significant stenosis of the
visualized portion of the aorta. Conventional 3 vessel aortic
branching pattern. The visualized proximal subclavian arteries are
widely patent.

RIGHT CAROTID SYSTEM: There is moderate stenosis of the proximal
right common carotid artery. No hemodynamically significant stenosis
of the internal carotid artery by NASCET criteria.

LEFT CAROTID SYSTEM: No dissection, occlusion or aneurysm. Mild
atherosclerotic calcification at the carotid bifurcation without
hemodynamically significant stenosis.

VERTEBRAL ARTERIES: Codominant configuration. Both origins are
clearly patent. There is no dissection, occlusion or flow-limiting
stenosis to the skull base (V1-V3 segments).

CTA HEAD FINDINGS

POSTERIOR CIRCULATION:

--Vertebral arteries: Normal V4 segments.

--Inferior cerebellar arteries: Normal.

--Basilar artery: Normal.

--Superior cerebellar arteries: Normal.

--Posterior cerebral arteries (PCA): Normal.

ANTERIOR CIRCULATION:

--Intracranial internal carotid arteries: Normal.

--Anterior cerebral arteries (ACA): Normal. Both A1 segments are
present. Patent anterior communicating artery (a-comm).

--Middle cerebral arteries (MCA): Normal.

VENOUS SINUSES: As permitted by contrast timing, patent.

ANATOMIC VARIANTS: None

Review of the MIP images confirms the above findings.
IMPRESSION: 1. No emergent large vessel occlusion or high-grade stenosis of the
intracranial arteries.
2. Moderate stenosis of the proximal right common carotid artery.
3. Mild left carotid bifurcation atherosclerosis without
hemodynamically significant stenosis.

Aortic atherosclerosis ([7D]-[7D]).

## 2020-04-25 IMAGING — CT CT ANGIO HEAD
1 of 11 series · 5 of 33 positions shown · IV contrast (omnipaque)
Comparison: None.

CLINICAL DATA: Right hand tingling

EXAM:
CT ANGIOGRAPHY HEAD AND NECK
TECHNIQUE: Multidetector CT imaging of the head and neck was performed using
the standard protocol during bolus administration of intravenous
contrast. Multiplanar CT image reconstructions and MIPs were
obtained to evaluate the vascular anatomy. Carotid stenosis
measurements (when applicable) are obtained utilizing NASCET
criteria, using the distal internal carotid diameter as the
denominator.
CONTRAST:  100mL OMNIPAQUE IOHEXOL 350 MG/ML SOLN

[Series 8: ax thin · axial · 0.38mm/px · z∈[-318,-70]mm · 5 of 373 slices shown]
[im 63/373  soft-tissue]
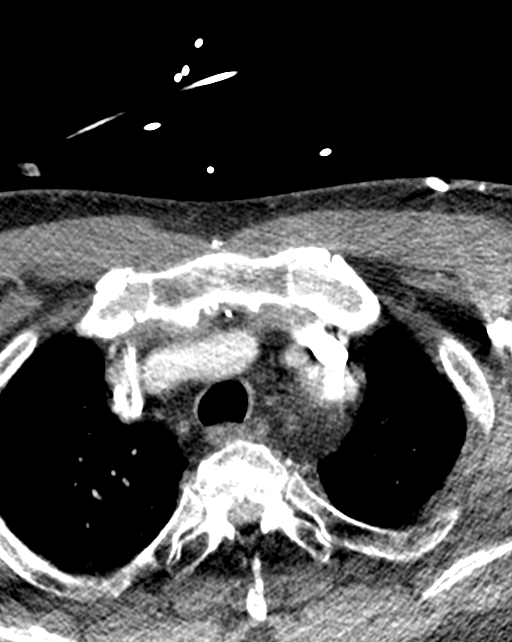
[im 125/373  bone]
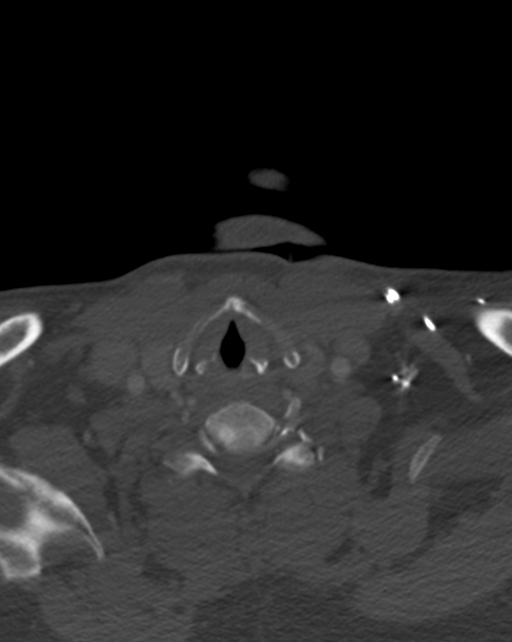
[im 187/373  soft-tissue]
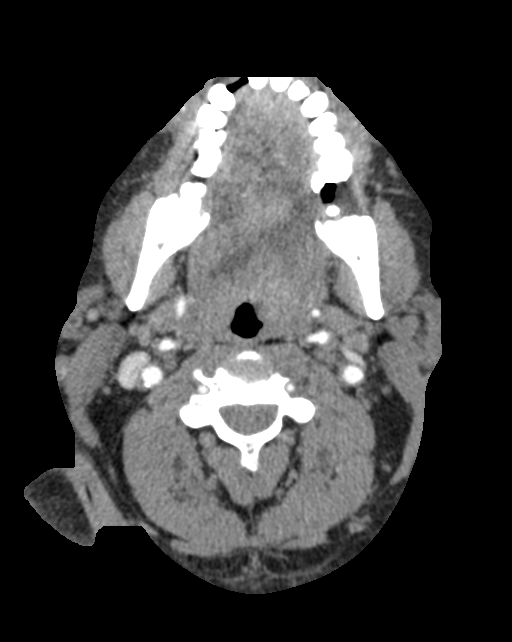
[im 249/373  bone]
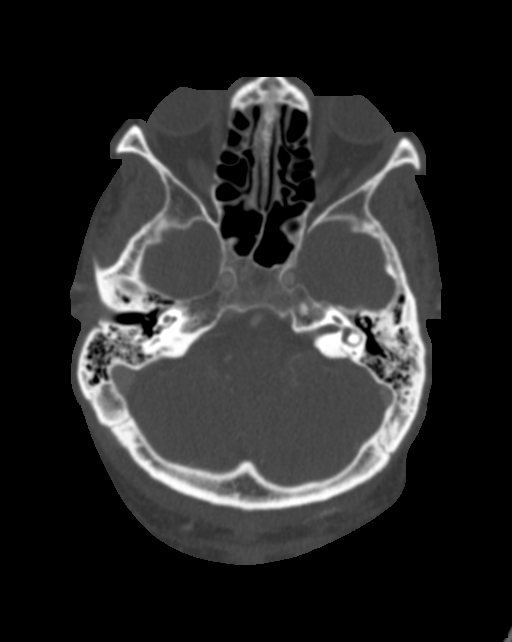
[im 311/373  soft-tissue]
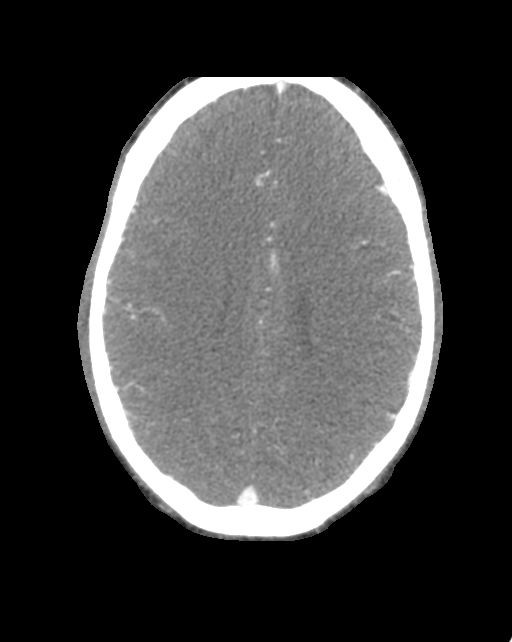

[5 of 33 positions shown; findings below may reference images not displayed]

FINDINGS: CT HEAD FINDINGS

Brain: There is no mass, hemorrhage or extra-axial collection. The
size and configuration of the ventricles and extra-axial CSF spaces
are normal. There is no acute or chronic infarction. The brain
parenchyma is normal.

Skull: The visualized skull base, calvarium and extracranial soft
tissues are normal.

Sinuses/Orbits: No fluid levels or advanced mucosal thickening of
the visualized paranasal sinuses. No mastoid or middle ear effusion.
The orbits are normal.

CTA NECK FINDINGS

SKELETON: There is no bony spinal canal stenosis. No lytic or
blastic lesion.

OTHER NECK: Normal pharynx, larynx and major salivary glands. No
cervical lymphadenopathy. Unremarkable thyroid gland.

UPPER CHEST: No pneumothorax or pleural effusion. No nodules or
masses.

AORTIC ARCH:

There is calcific atherosclerosis of the aortic arch. There is no
aneurysm, dissection or hemodynamically significant stenosis of the
visualized portion of the aorta. Conventional 3 vessel aortic
branching pattern. The visualized proximal subclavian arteries are
widely patent.

RIGHT CAROTID SYSTEM: There is moderate stenosis of the proximal
right common carotid artery. No hemodynamically significant stenosis
of the internal carotid artery by NASCET criteria.

LEFT CAROTID SYSTEM: No dissection, occlusion or aneurysm. Mild
atherosclerotic calcification at the carotid bifurcation without
hemodynamically significant stenosis.

VERTEBRAL ARTERIES: Codominant configuration. Both origins are
clearly patent. There is no dissection, occlusion or flow-limiting
stenosis to the skull base (V1-V3 segments).

CTA HEAD FINDINGS

POSTERIOR CIRCULATION:

--Vertebral arteries: Normal V4 segments.

--Inferior cerebellar arteries: Normal.

--Basilar artery: Normal.

--Superior cerebellar arteries: Normal.

--Posterior cerebral arteries (PCA): Normal.

ANTERIOR CIRCULATION:

--Intracranial internal carotid arteries: Normal.

--Anterior cerebral arteries (ACA): Normal. Both A1 segments are
present. Patent anterior communicating artery (a-comm).

--Middle cerebral arteries (MCA): Normal.

VENOUS SINUSES: As permitted by contrast timing, patent.

ANATOMIC VARIANTS: None

Review of the MIP images confirms the above findings.
IMPRESSION: 1. No emergent large vessel occlusion or high-grade stenosis of the
intracranial arteries.
2. Moderate stenosis of the proximal right common carotid artery.
3. Mild left carotid bifurcation atherosclerosis without
hemodynamically significant stenosis.

Aortic atherosclerosis ([7D]-[7D]).

## 2020-04-25 MED ORDER — STROKE: EARLY STAGES OF RECOVERY BOOK
Freq: Once | Status: AC
  Filled 2020-04-25 (×2): qty 1

## 2020-04-25 MED ORDER — INSULIN GLARGINE 100 UNIT/ML ~~LOC~~ SOLN
20.0000 [IU] | Freq: Every day | SUBCUTANEOUS | Status: DC
  Administered 2020-04-25 – 2020-04-26 (×3): 20 [IU] via SUBCUTANEOUS
  Filled 2020-04-25 (×5): qty 0.2

## 2020-04-25 MED ORDER — ACETAMINOPHEN 650 MG RE SUPP: 650.0000 mg | RECTAL | Status: DC | PRN

## 2020-04-25 MED ORDER — IOHEXOL 350 MG/ML SOLN
100.0000 mL | Freq: Once | INTRAVENOUS | Status: AC | PRN
  Administered 2020-04-25: 100 mL via INTRAVENOUS

## 2020-04-25 MED ORDER — NICOTINE 14 MG/24HR TD PT24
14.0000 mg | MEDICATED_PATCH | Freq: Every day | TRANSDERMAL | Status: DC
  Administered 2020-04-25 – 2020-04-27 (×3): 14 mg via TRANSDERMAL
  Filled 2020-04-25 (×3): qty 1

## 2020-04-25 MED ORDER — ACETAMINOPHEN 325 MG PO TABS: 650.0000 mg | ORAL_TABLET | ORAL | Status: DC | PRN

## 2020-04-25 MED ORDER — ASPIRIN 81 MG PO CHEW
324.0000 mg | CHEWABLE_TABLET | Freq: Once | ORAL | Status: AC
  Administered 2020-04-25: 324 mg via ORAL
  Filled 2020-04-25: qty 4

## 2020-04-25 MED ORDER — ASPIRIN EC 81 MG PO TBEC
81.0000 mg | DELAYED_RELEASE_TABLET | Freq: Every day | ORAL | Status: DC
  Administered 2020-04-25 – 2020-04-27 (×3): 81 mg via ORAL
  Filled 2020-04-25 (×3): qty 1

## 2020-04-25 MED ORDER — ACETAMINOPHEN 160 MG/5ML PO SOLN: 650.0000 mg | ORAL | Status: DC | PRN

## 2020-04-25 MED ORDER — INSULIN ASPART 100 UNIT/ML ~~LOC~~ SOLN
6.0000 [IU] | Freq: Three times a day (TID) | SUBCUTANEOUS | Status: DC
  Administered 2020-04-25 – 2020-04-27 (×8): 6 [IU] via SUBCUTANEOUS
  Filled 2020-04-25: qty 0.06

## 2020-04-25 MED ORDER — TAMSULOSIN HCL 0.4 MG PO CAPS
0.4000 mg | ORAL_CAPSULE | Freq: Every day | ORAL | Status: DC
  Administered 2020-04-25 – 2020-04-27 (×3): 0.4 mg via ORAL
  Filled 2020-04-25 (×3): qty 1

## 2020-04-25 MED ORDER — ATORVASTATIN CALCIUM 40 MG PO TABS
40.0000 mg | ORAL_TABLET | Freq: Every day | ORAL | Status: DC
  Administered 2020-04-25 – 2020-04-27 (×3): 40 mg via ORAL
  Filled 2020-04-25 (×3): qty 1

## 2020-04-25 MED ORDER — INSULIN ASPART 100 UNIT/ML ~~LOC~~ SOLN
0.0000 [IU] | Freq: Three times a day (TID) | SUBCUTANEOUS | Status: DC
  Administered 2020-04-25: 13:00:00 8 [IU] via SUBCUTANEOUS
  Administered 2020-04-25: 22:00:00 5 [IU] via SUBCUTANEOUS
  Administered 2020-04-25: 18:00:00 3 [IU] via SUBCUTANEOUS
  Administered 2020-04-25: 10:00:00 15 [IU] via SUBCUTANEOUS
  Administered 2020-04-26: 14:00:00 5 [IU] via SUBCUTANEOUS
  Administered 2020-04-26: 07:00:00 3 [IU] via SUBCUTANEOUS
  Administered 2020-04-26: 18:00:00 2 [IU] via SUBCUTANEOUS
  Administered 2020-04-26: 23:00:00 5 [IU] via SUBCUTANEOUS
  Administered 2020-04-27 (×2): 3 [IU] via SUBCUTANEOUS
  Filled 2020-04-25: qty 0.15

## 2020-04-25 NOTE — Progress Notes (Signed)
Echocardiogram 2D Echocardiogram has been performed.  Ryan Hughes Ryan Hughes 04/25/2020, 10:30 AM

## 2020-04-25 NOTE — Progress Notes (Signed)
OT Cancellation Note  Patient Details Name: Ryan Hughes MRN: [id number] DOB: 08/12/1965   Cancelled Treatment:    Reason Eval/Treat Not Completed: OT screened, no needs identified, will sign off. Per PT patient only reports numbness and tingling as a deficit. No physical deficits and ability to perform fine motor tasks, ADLs and functional mobility. OT will sign off. If patient's condition changes - please reorder.  Ryan Hughes L Ryan Hughes 04/25/2020, 12:32 PM

## 2020-04-25 NOTE — H&P (Signed)
History and Physical    Jamont Mellin DXA:[id number] DOB: [birth year] DOA: 04/24/2020  PCP: Patient, No Pcp Per  Patient coming from: Home via EMS   Chief Complaint:   Right upper extremity tingling  HPI:    54 year old male with past medical history of medical noncompliance, nicotine dependence, benign prostatic hyperplasia, hyperlipidemia, diabetic polyneuropathy and insulin-dependent diabetes mellitus type 2 who presents to [facility] emergency department via EMS with complaints of right arm tingling.  Patient explains that approximately 3 days ago in the middle the day the patient began to suddenly develop right arm tingling.  He denies any associated focal weakness, slurred speech, changes in vision or loss of balance.  Patient denies any associated headache.    Patient states that over the following 3 days his symptoms of right hand and upper extremity tingling did not resolve and continued to persist.    Patient reports that he has a longstanding history of diabetes mellitus and used to take insulin but has not followed up with a primary care provider nor has he taken medications for his diabetes or hyperlipidemia in at least 3 years.  Upon further questioning patient denies fevers, sick contacts, recent travel or confirmed contact with COVID-19.  Due to patient's persisting symptoms patient was eventually brought into [facility] emergency department via EMS.  Upon evaluation in the emergency department, MRI of the brain revealed an acute area of ischemia of the left thalamus.  It also revealed an old right basal ganglia infarct.  MRI was reviewed with Dr. Otelia Limes with neurology who recommended admitting the patient to [facility] for neurology consultation.  The hospitalist group is now been called to assess the patient for admission to the hospital.  Review of Systems:   Review of Systems  Neurological: Positive for tingling.  All other systems reviewed  and are negative.   Past Medical History:  Diagnosis Date  . Diabetes mellitus without complication (HCC)   . Nicotine dependence, cigarettes, uncomplicated 04/25/2020    History reviewed. No pertinent surgical history.   reports that he has been smoking cigarettes. He has been smoking about 0.50 packs per day. He has never used smokeless tobacco. He reports current alcohol use. He reports current drug use. Drug: Marijuana.  No Known Allergies  Family History  Problem Relation Age of Onset  . Stroke Mother      Prior to Admission medications   Medication Sig Start Date End Date Taking? Authorizing Provider  docusate sodium (COLACE) 100 MG capsule Take 1 capsule (100 mg total) by mouth every 12 (twelve) hours. Patient not taking: No sig reported 04/15/18   Michela Pitcher A, PA-C  metFORMIN (GLUCOPHAGE) 500 MG tablet Take 1 tablet (500 mg total) by mouth 2 (two) times daily with a meal. 04/24/20   Bethann Berkshire, MD  naproxen (NAPROSYN) 375 MG tablet Take 1 tablet (375 mg total) by mouth 2 (two) times daily. Patient not taking: Reported on 04/25/2020 03/12/19   Bill Salinas, PA-C    Physical Exam: Vitals:   04/24/20 2200 04/24/20 2215 04/25/20 0027 04/25/20 0135  BP: (!) 147/102 (!) 147/102 (!) 151/81 111/87  Pulse: 64 62 63 65  Resp:  18 (!) 24 20  SpO2: 99% 96% 97% 96%    Constitutional: Acute alert and oriented x3, no associated distress.  Patient is obese. Skin: no rashes, no lesions, good skin turgor noted. Eyes: Pupils are equally reactive to light.  No evidence of scleral icterus or conjunctival  pallor.  ENMT: Moist mucous membranes noted.  Posterior pharynx clear of any exudate or lesions.   Neck: normal, supple, no masses, no thyromegaly.  No evidence of jugular venous distension.   Respiratory: clear to auscultation bilaterally, no wheezing, no crackles. Normal respiratory effort. No accessory muscle use.  Cardiovascular: Regular rate and rhythm, no murmurs /  rubs / gallops. No extremity edema. 2+ pedal pulses. No carotid bruits.  Chest:   Nontender without crepitus or deformity.   Back:   Nontender without crepitus or deformity. Abdomen: Abdomen is soft and nontender.  No evidence of intra-abdominal masses.  Positive bowel sounds noted in all quadrants.   Musculoskeletal: No joint deformity upper and lower extremities. Good ROM, no contractures. Normal muscle tone.  Neurologic: CN 2-12 grossly intact. Sensation intact.  Patient moving all 4 extremities spontaneously.  Patient is following all commands.  Patient is responsive to verbal stimuli.  Psychiatric: Patient exhibits normal mood with appropriate affect.  Patient seems to possess insight as to their current situation.     Labs on Admission: I have personally reviewed following labs and imaging studies -   CBC: Recent Labs  Lab 04/24/20 1700  WBC 6.4  NEUTROABS 3.2  HGB 12.8*  HCT 37.9*  MCV 93.3  PLT 225   Basic Metabolic Panel: Recent Labs  Lab 04/24/20 1700  NA 135  K 3.6  CL 98  CO2 26  GLUCOSE 389*  BUN 11  CREATININE 1.01  CALCIUM 8.8*   GFR: CrCl cannot be calculated (Unknown ideal weight.). Liver Function Tests: Recent Labs  Lab 04/24/20 1700  AST 16  ALT 13  ALKPHOS 73  BILITOT 0.4  PROT 7.0  ALBUMIN 3.8   No results for input(s): LIPASE, AMYLASE in the last 168 hours. No results for input(s): AMMONIA in the last 168 hours. Coagulation Profile: No results for input(s): INR, PROTIME in the last 168 hours. Cardiac Enzymes: No results for input(s): CKTOTAL, CKMB, CKMBINDEX, TROPONINI in the last 168 hours. BNP (last 3 results) No results for input(s): PROBNP in the last 8760 hours. HbA1C: No results for input(s): HGBA1C in the last 72 hours. CBG: Recent Labs  Lab 04/24/20 1709 04/24/20 2159  GLUCAP 393* 177*   Lipid Profile: No results for input(s): CHOL, HDL, LDLCALC, TRIG, CHOLHDL, LDLDIRECT in the last 72 hours. Thyroid Function Tests: No  results for input(s): TSH, T4TOTAL, FREET4, T3FREE, THYROIDAB in the last 72 hours. Anemia Panel: No results for input(s): VITAMINB12, FOLATE, FERRITIN, TIBC, IRON, RETICCTPCT in the last 72 hours. Urine analysis:    Component Value Date/Time   COLORURINE YELLOW 04/24/2020 2001   APPEARANCEUR CLEAR 04/24/2020 2001   LABSPEC 1.030 04/24/2020 2001   PHURINE 5.0 04/24/2020 2001   GLUCOSEU >=500 (A) 04/24/2020 2001   HGBUR NEGATIVE 04/24/2020 2001   BILIRUBINUR NEGATIVE 04/24/2020 2001   KETONESUR NEGATIVE 04/24/2020 2001   PROTEINUR NEGATIVE 04/24/2020 2001   UROBILINOGEN 0.2 07/04/2010 0959   NITRITE NEGATIVE 04/24/2020 2001   LEUKOCYTESUR NEGATIVE 04/24/2020 2001    Radiological Exams on Admission - Personally Reviewed: MR BRAIN WO CONTRAST  Result Date: 04/24/2020 CLINICAL DATA:  Right hand tingling EXAM: MRI HEAD WITHOUT CONTRAST TECHNIQUE: Multiplanar, multiecho pulse sequences of the brain and surrounding structures were obtained without intravenous contrast. COMPARISON:  None. FINDINGS: Brain: There is a small focus of abnormal diffusion restriction within the dorsolateral left thalamus. No acute or chronic hemorrhage. There is multifocal hyperintense T2-weighted signal within the white matter. Parenchymal volume and CSF spaces  are normal. There is an old right basal ganglia small vessel infarct. Vascular: Major flow voids are preserved. Skull and upper cervical spine: Normal calvarium and skull base. Visualized upper cervical spine and soft tissues are normal. Sinuses/Orbits:No paranasal sinus fluid levels or advanced mucosal thickening. Small amount of left mastoid fluid. Normal orbits. IMPRESSION: 1. Small focus of acute ischemia within the dorsolateral left thalamus. No hemorrhage or mass effect. 2. Old right basal ganglia small vessel infarct. Electronically Signed   By: Deatra Robinson M.D.   On: 04/24/2020 23:53   DG Chest Port 1 View  Result Date: 04/24/2020 CLINICAL DATA:   Weakness. EXAM: PORTABLE CHEST 1 VIEW COMPARISON:  None. FINDINGS: Borderline mild cardiomegaly. Normal mediastinal contours. Vascular congestion without evidence of pulmonary edema. No focal airspace disease. No pleural fluid or pneumothorax. No acute osseous abnormalities are seen. IMPRESSION: Borderline mild cardiomegaly with vascular congestion. Electronically Signed   By: Narda Rutherford M.D.   On: 04/24/2020 17:39    EKG: Personally reviewed.  Rhythm is sinus rhythm with heart rate of 74 bpm.  Evidence of intraventricular conduction delay.   no dynamic ST segment changes appreciated.  Assessment/Plan Principal Problem:   Acute arterial ischemic stroke, vertebrobasilar, thalamic, left [facility])   Patient presenting with new neurologic deficit in the setting of longstanding uncontrolled diabetes mellitus  MRI brain reveals small area of acute ischemia of the left thalamus in addition to an old right basal ganglia infarct  Initiating patient on aspirin daily  Obtaining lipid panel and initiating statin therapy if LDL above 70  Monitoring patient on telemetry  Serial neurologic checks  Speech therapy, occupational therapy, physical therapy evaluation  Echocardiogram ordered  CT angiogram of the head and neck ordered  Case discussed with Dr. Otelia Limes by the emergency department staff who recommended hospitalization at [facility] and neurology consultation.  Active Problems:   Uncontrolled type 2 diabetes mellitus with hyperglycemia, without long-term current use of insulin (HCC)   Patient reports not having taken insulin in approximately 3 to 4 years  Review of old outpatient primary care note from 2014 reveals the patient used to be on Novolin 70/30 30 units twice daily  Based on 0.4 units/kg we will initiate patient on 20 units of Lantus as well as 6 units of NovoLog before every meal  Accu-Cheks before every meal and nightly with sliding scale insulin  Hemoglobin A1c ordered  and is pending    Mixed hyperlipidemia due to type 2 diabetes mellitus (HCC)   Obtaining lipid panel  Will initiate moderate to high-dose statin therapy once LDL is revealed to be above 70.    Benign prostatic hyperplasia (BPH) with straining on urination   Patient reports ongoing weak urinary stream and difficulty initiating stream.  Patient has documented history of BPH but is currently not on therapy  Flomax 0.4 mg daily    Nicotine dependence, cigarettes, uncomplicated   Patient is being counseled daily on smoking cessation. Providing patient with nicotine replacement therapy during this hospitalization.    Code Status:   Full Code Family Communication: deferred   Status is: Observation  The patient remains OBS appropriate and will d/c before 2 midnights.  Dispo: The patient is from: Home              Anticipated d/c is to: Home              Anticipated d/c date is: 2 days  Patient currently is not medically stable to d/c.        Marinda Elk MD [facility] Pager [phone]  If 7PM-7AM, please contact night-coverage [email or web address] Use universal [place] password for that web site. If you do not have the password, please call the hospital operator.  04/25/2020, 2:44 AM

## 2020-04-25 NOTE — ED Notes (Signed)
Patient given sandwich and crackers.

## 2020-04-25 NOTE — ED Notes (Signed)
Physical therapy at bedside, will administer insulin once therapy complete. Patient also provided with meal tray.

## 2020-04-25 NOTE — ED Notes (Signed)
Breakfast tray given to pt 

## 2020-04-25 NOTE — ED Provider Notes (Signed)
I assumed care of this patient.  Please see previous provider note for further details of Hx, PE.  Briefly patient is a 54 y.o. male who presented with nausea vomiting and right hand numbness and tingling.  Found to be hyperglycemic without DKA.  Currently pending an MRI to rule out stroke.  MRI revealed an acute thalamic stroke.  We will admit patient for stroke management.      Nira Conn, MD 04/25/20 [phone]

## 2020-04-25 NOTE — ED Notes (Signed)
Patient stated his breakfast was not good Gave patient [place] sandwich / ginger-ale

## 2020-04-25 NOTE — ED Notes (Signed)
ECHO at bedside.

## 2020-04-25 NOTE — Progress Notes (Signed)
PROGRESS NOTE    Ryan Hughes  YKZ:[id number] DOB: Aug 18, 1965 DOA: 04/24/2020 PCP: Patient, No Pcp Per    Brief Narrative:  54yo with hx DM tobacco abuse, medical noncompliance, having stopped his medication including diabetic meds, presented with R arm tingling. Pt was found to have an acute L thalamic stroke.  Assessment & Plan:   Principal Problem:   Acute arterial ischemic stroke, vertebrobasilar, thalamic, left (HCC) Active Problems:   Uncontrolled type 2 diabetes mellitus with hyperglycemia, without long-term current use of insulin (HCC)   Mixed hyperlipidemia due to type 2 diabetes mellitus (HCC)   Benign prostatic hyperplasia (BPH) with straining on urination   Nicotine dependence, cigarettes, uncomplicated  Principal Problem:   Acute arterial ischemic stroke, vertebrobasilar, thalamic, left [facility])  Patient presenting with new neurologic deficit in the setting of longstanding uncontrolled diabetes mellitus  MRI brain reveals small area of acute ischemia of the left thalamus in addition to an old right basal ganglia infarct  Initiating patient on aspirin daily  Reviewed lipid panel LDL is 130, will start statin  Cont to monitor on tele  Serial neurologic checks  Speech therapy, occupational therapy, physical therapy evaluation  Echocardiogram ordered and reviewed. Findings of normal LVEF with no evidence of interatrial shunting  CT angiogram of the head and neck reviewed with finding of moderate stenosis of prox RCA  Neuro was consulted at time of presentation and pt is awaiting transfer to [facility] for formal neurologic/stroke eval  Active Problems:   Uncontrolled type 2 diabetes mellitus with hyperglycemia, without long-term current use of insulin (HCC)   Patient reports not having taken insulin in approximately 3 to 4 years  outpatient primary care note from 2014 reveals the patient used to be on Novolin 70/30 30 units twice daily  Based on 0.4 units/kg we  will initiate patient on 20 units of Lantus as well as 6 units of NovoLog before every meal  Accu-Cheks before every meal and nightly with sliding scale insulin  Hemoglobin A1c noted to be 14.2  Pt educated on importance of being compliant with regimen    Mixed hyperlipidemia due to type 2 diabetes mellitus (HCC)   Per above, have started statin    Benign prostatic hyperplasia (BPH) with straining on urination   Patient reports ongoing weak urinary stream and difficulty initiating stream.  Patient has documented history of BPH but is currently not on therapy  On Flomax 0.4 mg daily    Nicotine dependence, cigarettes, uncomplicated    Cessation done during this admit  Providing patient with nicotine replacement therapy during this hospitalization.   DVT prophylaxis: SCD's Code Status: Full Family Communication: Pt in room, family not at bedside  Status is: Observation  The patient will require care spanning > 2 midnights and should be moved to inpatient because: Inpatient level of care appropriate due to severity of illness  Dispo: The patient is from: Home              Anticipated d/c is to: Home              Anticipated d/c date is: 2 days              Patient currently is not medically stable to d/c.       Consultants:   Neurology  Procedures:     Antimicrobials: Anti-infectives (From admission, onward)   None       Subjective: Still having arm numbness  Objective: Vitals:   04/25/20 1100 04/25/20  1111 04/25/20 1115 04/25/20 1426  BP:   (!) 178/99 140/90  Pulse: 66  79 79  Resp: (!) 28  20 20   Temp:  98.7 F (37.1 C)  99.3 F (37.4 C)  TempSrc:  Oral  Oral  SpO2: 100%  100% 100%  Weight:    99.8 kg  Height:    5\' 6"  (1.676 m)   No intake or output data in the 24 hours ending 04/25/20 1651 Filed Weights   04/25/20 1426  Weight: 99.8 kg    Examination:  General exam: Appears calm and comfortable  Respiratory system: Clear to  auscultation. Respiratory effort normal. Cardiovascular system: S1 & S2 heard, Regular Gastrointestinal system: Abdomen is nondistended, soft and nontender. No organomegaly or masses felt. Normal bowel sounds heard. Central nervous system: Alert and oriented. No tremors, RUE with numbness Extremities: Symmetric 5 x 5 power. Skin: No rashes, lesions Psychiatry: Judgement and insight appear normal. Mood & affect appropriate.   Data Reviewed: I have personally reviewed following labs and imaging studies  CBC: Recent Labs  Lab 04/24/20 1700 04/25/20 0417  WBC 6.4 6.5  NEUTROABS 3.2  --   HGB 12.8* 13.9  HCT 37.9* 40.6  MCV 93.3 92.3  PLT 225 238   Basic Metabolic Panel: Recent Labs  Lab 04/24/20 1700 04/25/20 0417  NA 135 136  K 3.6 3.5  CL 98 100  CO2 26 26  GLUCOSE 389* 358*  BUN 11 10  CREATININE 1.01 0.70  CALCIUM 8.8* 8.8*   GFR: Estimated Creatinine Clearance: 115.4 mL/min (by C-G formula based on SCr of 0.7 mg/dL). Liver Function Tests: Recent Labs  Lab 04/24/20 1700 04/25/20 0417  AST 16 11*  ALT 13 13  ALKPHOS 73 73  BILITOT 0.4 0.6  PROT 7.0 6.4*  ALBUMIN 3.8 3.6   No results for input(s): LIPASE, AMYLASE in the last 168 hours. No results for input(s): AMMONIA in the last 168 hours. Coagulation Profile: No results for input(s): INR, PROTIME in the last 168 hours. Cardiac Enzymes: No results for input(s): CKTOTAL, CKMB, CKMBINDEX, TROPONINI in the last 168 hours. BNP (last 3 results) No results for input(s): PROBNP in the last 8760 hours. HbA1C: Recent Labs    04/25/20 0417  HGBA1C 14.2*   CBG: Recent Labs  Lab 04/24/20 1709 04/24/20 2159 04/25/20 0955 04/25/20 1203  GLUCAP 393* 177* 390* 256*   Lipid Profile: Recent Labs    04/25/20 0417  CHOL 205*  HDL 38*  LDLCALC 130*  TRIG 185*  CHOLHDL 5.4   Thyroid Function Tests: No results for input(s): TSH, T4TOTAL, FREET4, T3FREE, THYROIDAB in the last 72 hours. Anemia Panel: No  results for input(s): VITAMINB12, FOLATE, FERRITIN, TIBC, IRON, RETICCTPCT in the last 72 hours. Sepsis Labs: No results for input(s): PROCALCITON, LATICACIDVEN in the last 168 hours.  Recent Results (from the past 240 hour(s))  Resp Panel by RT-PCR (Flu A&B, Covid) Nasopharyngeal Swab     Status: None   Collection Time: 04/24/20  5:50 PM   Specimen: Nasopharyngeal Swab; Nasopharyngeal(NP) swabs in vial transport medium  Result Value Ref Range Status   SARS Coronavirus 2 by RT PCR NEGATIVE NEGATIVE Final    Comment: (NOTE) SARS-CoV-2 target nucleic acids are NOT DETECTED.  The SARS-CoV-2 RNA is generally detectable in upper respiratory specimens during the acute phase of infection. The lowest concentration of SARS-CoV-2 viral copies this assay can detect is 138 copies/mL. A negative result does not preclude SARS-Cov-2 infection and should not be used  as the sole basis for treatment or other patient management decisions. A negative result may occur with  improper specimen collection/handling, submission of specimen other than nasopharyngeal swab, presence of viral mutation(s) within the areas targeted by this assay, and inadequate number of viral copies(<138 copies/mL). A negative result must be combined with clinical observations, patient history, and epidemiological information. The expected result is Negative.  Fact Sheet for Patients:  BloggerCourse.com  Fact Sheet for Healthcare Providers:  SeriousBroker.it  This test is no t yet approved or cleared by the [place] FDA and  has been authorized for detection and/or diagnosis of SARS-CoV-2 by FDA under an Emergency Use Authorization (EUA). This EUA will remain  in effect (meaning this test can be used) for the duration of the COVID-19 declaration under Section 564(b)(1) of the Act, 21 U.S.C.section 360bbb-3(b)(1), unless the authorization is terminated  or revoked sooner.        Influenza A by PCR NEGATIVE NEGATIVE Final   Influenza B by PCR NEGATIVE NEGATIVE Final    Comment: (NOTE) The Xpert Xpress SARS-CoV-2/FLU/RSV plus assay is intended as an aid in the diagnosis of influenza from Nasopharyngeal swab specimens and should not be used as a sole basis for treatment. Nasal washings and aspirates are unacceptable for Xpert Xpress SARS-CoV-2/FLU/RSV testing.  Fact Sheet for Patients: BloggerCourse.com  Fact Sheet for Healthcare Providers: SeriousBroker.it  This test is not yet approved or cleared by the [place] FDA and has been authorized for detection and/or diagnosis of SARS-CoV-2 by FDA under an Emergency Use Authorization (EUA). This EUA will remain in effect (meaning this test can be used) for the duration of the COVID-19 declaration under Section 564(b)(1) of the Act, 21 U.S.C. section 360bbb-3(b)(1), unless the authorization is terminated or revoked.  Performed at [facility], [address], [place], [place] [address]      Radiology Studies: CT ANGIO HEAD W OR WO CONTRAST  Result Date: 04/25/2020 CLINICAL DATA:  Right hand tingling EXAM: CT ANGIOGRAPHY HEAD AND NECK TECHNIQUE: Multidetector CT imaging of the head and neck was performed using the standard protocol during bolus administration of intravenous contrast. Multiplanar CT image reconstructions and MIPs were obtained to evaluate the vascular anatomy. Carotid stenosis measurements (when applicable) are obtained utilizing NASCET criteria, using the distal internal carotid diameter as the denominator. CONTRAST:  OMNIPAQUE IOHEXOL 350 MG/ML SOLN COMPARISON:  None. FINDINGS: CT HEAD FINDINGS Brain: There is no mass, hemorrhage or extra-axial collection. The size and configuration of the ventricles and extra-axial CSF spaces are normal. There is no acute or chronic infarction. The brain parenchyma is normal.  Skull: The visualized skull base, calvarium and extracranial soft tissues are normal. Sinuses/Orbits: No fluid levels or advanced mucosal thickening of the visualized paranasal sinuses. No mastoid or middle ear effusion. The orbits are normal. CTA NECK FINDINGS SKELETON: There is no bony spinal canal stenosis. No lytic or blastic lesion. OTHER NECK: Normal pharynx, larynx and major salivary glands. No cervical lymphadenopathy. Unremarkable thyroid gland. UPPER CHEST: No pneumothorax or pleural effusion. No nodules or masses. AORTIC ARCH: There is calcific atherosclerosis of the aortic arch. There is no aneurysm, dissection or hemodynamically significant stenosis of the visualized portion of the aorta. Conventional 3 vessel aortic branching pattern. The visualized proximal subclavian arteries are widely patent. RIGHT CAROTID SYSTEM: There is moderate stenosis of the proximal right common carotid artery. No hemodynamically significant stenosis of the internal carotid artery by NASCET criteria. LEFT CAROTID SYSTEM: No dissection, occlusion or  aneurysm. Mild atherosclerotic calcification at the carotid bifurcation without hemodynamically significant stenosis. VERTEBRAL ARTERIES: Codominant configuration. Both origins are clearly patent. There is no dissection, occlusion or flow-limiting stenosis to the skull base (V1-V3 segments). CTA HEAD FINDINGS POSTERIOR CIRCULATION: --Vertebral arteries: Normal V4 segments. --Inferior cerebellar arteries: Normal. --Basilar artery: Normal. --Superior cerebellar arteries: Normal. --Posterior cerebral arteries (PCA): Normal. ANTERIOR CIRCULATION: --Intracranial internal carotid arteries: Normal. --Anterior cerebral arteries (ACA): Normal. Both A1 segments are present. Patent anterior communicating artery (a-comm). --Middle cerebral arteries (MCA): Normal. VENOUS SINUSES: As permitted by contrast timing, patent. ANATOMIC VARIANTS: None Review of the MIP images confirms the above  findings. IMPRESSION: 1. No emergent large vessel occlusion or high-grade stenosis of the intracranial arteries. 2. Moderate stenosis of the proximal right common carotid artery. 3. Mild left carotid bifurcation atherosclerosis without hemodynamically significant stenosis. Aortic atherosclerosis (ICD10-I70.0). Electronically Signed   By: Deatra RobinsonKevin  Herman M.D.   On: 04/25/2020 03:42   CT ANGIO NECK W OR WO CONTRAST  Result Date: 04/25/2020 CLINICAL DATA:  Right hand tingling EXAM: CT ANGIOGRAPHY HEAD AND NECK TECHNIQUE: Multidetector CT imaging of the head and neck was performed using the standard protocol during bolus administration of intravenous contrast. Multiplanar CT image reconstructions and MIPs were obtained to evaluate the vascular anatomy. Carotid stenosis measurements (when applicable) are obtained utilizing NASCET criteria, using the distal internal carotid diameter as the denominator. CONTRAST:  100mL OMNIPAQUE IOHEXOL 350 MG/ML SOLN COMPARISON:  None. FINDINGS: CT HEAD FINDINGS Brain: There is no mass, hemorrhage or extra-axial collection. The size and configuration of the ventricles and extra-axial CSF spaces are normal. There is no acute or chronic infarction. The brain parenchyma is normal. Skull: The visualized skull base, calvarium and extracranial soft tissues are normal. Sinuses/Orbits: No fluid levels or advanced mucosal thickening of the visualized paranasal sinuses. No mastoid or middle ear effusion. The orbits are normal. CTA NECK FINDINGS SKELETON: There is no bony spinal canal stenosis. No lytic or blastic lesion. OTHER NECK: Normal pharynx, larynx and major salivary glands. No cervical lymphadenopathy. Unremarkable thyroid gland. UPPER CHEST: No pneumothorax or pleural effusion. No nodules or masses. AORTIC ARCH: There is calcific atherosclerosis of the aortic arch. There is no aneurysm, dissection or hemodynamically significant stenosis of the visualized portion of the aorta.  Conventional 3 vessel aortic branching pattern. The visualized proximal subclavian arteries are widely patent. RIGHT CAROTID SYSTEM: There is moderate stenosis of the proximal right common carotid artery. No hemodynamically significant stenosis of the internal carotid artery by NASCET criteria. LEFT CAROTID SYSTEM: No dissection, occlusion or aneurysm. Mild atherosclerotic calcification at the carotid bifurcation without hemodynamically significant stenosis. VERTEBRAL ARTERIES: Codominant configuration. Both origins are clearly patent. There is no dissection, occlusion or flow-limiting stenosis to the skull base (V1-V3 segments). CTA HEAD FINDINGS POSTERIOR CIRCULATION: --Vertebral arteries: Normal V4 segments. --Inferior cerebellar arteries: Normal. --Basilar artery: Normal. --Superior cerebellar arteries: Normal. --Posterior cerebral arteries (PCA): Normal. ANTERIOR CIRCULATION: --Intracranial internal carotid arteries: Normal. --Anterior cerebral arteries (ACA): Normal. Both A1 segments are present. Patent anterior communicating artery (a-comm). --Middle cerebral arteries (MCA): Normal. VENOUS SINUSES: As permitted by contrast timing, patent. ANATOMIC VARIANTS: None Review of the MIP images confirms the above findings. IMPRESSION: 1. No emergent large vessel occlusion or high-grade stenosis of the intracranial arteries. 2. Moderate stenosis of the proximal right common carotid artery. 3. Mild left carotid bifurcation atherosclerosis without hemodynamically significant stenosis. Aortic atherosclerosis (ICD10-I70.0). Electronically Signed   By: Deatra RobinsonKevin  Herman M.D.   On: 04/25/2020 03:42   MR  BRAIN WO CONTRAST  Result Date: 04/24/2020 CLINICAL DATA:  Right hand tingling EXAM: MRI HEAD WITHOUT CONTRAST TECHNIQUE: Multiplanar, multiecho pulse sequences of the brain and surrounding structures were obtained without intravenous contrast. COMPARISON:  None. FINDINGS: Brain: There is a small focus of abnormal diffusion  restriction within the dorsolateral left thalamus. No acute or chronic hemorrhage. There is multifocal hyperintense T2-weighted signal within the white matter. Parenchymal volume and CSF spaces are normal. There is an old right basal ganglia small vessel infarct. Vascular: Major flow voids are preserved. Skull and upper cervical spine: Normal calvarium and skull base. Visualized upper cervical spine and soft tissues are normal. Sinuses/Orbits:No paranasal sinus fluid levels or advanced mucosal thickening. Small amount of left mastoid fluid. Normal orbits. IMPRESSION: 1. Small focus of acute ischemia within the dorsolateral left thalamus. No hemorrhage or mass effect. 2. Old right basal ganglia small vessel infarct. Electronically Signed   By: Deatra Robinson M.D.   On: 04/24/2020 23:53   DG Chest Port 1 View  Result Date: 04/24/2020 CLINICAL DATA:  Weakness. EXAM: PORTABLE CHEST 1 VIEW COMPARISON:  None. FINDINGS: Borderline mild cardiomegaly. Normal mediastinal contours. Vascular congestion without evidence of pulmonary edema. No focal airspace disease. No pleural fluid or pneumothorax. No acute osseous abnormalities are seen. IMPRESSION: Borderline mild cardiomegaly with vascular congestion. Electronically Signed   By: Narda Rutherford M.D.   On: 04/24/2020 17:39   ECHOCARDIOGRAM COMPLETE BUBBLE STUDY  Result Date: 04/25/2020    ECHOCARDIOGRAM REPORT   Patient Name:   JOVAUGHN Scheu Date of Exam: 04/25/2020 Medical Rec #:  [id number]     Height:       66.0 in Accession #:    [id number]    Weight:       220.0 lb Date of Birth:  [birth year]     BSA:          2.082 m Patient Age:    54 years      BP:           158/83 mmHg Patient Gender: M             HR:           63 bpm. Exam Location:  Inpatient Procedure: 2D Echo, Color Doppler, Cardiac Doppler and Saline Contrast Bubble            Study Indications:    Stroke i163.9  History:        Patient has no prior history of Echocardiogram examinations.                  Risk Factors:Diabetes and Dyslipidemia.  Sonographer:    Irving Burton Senior RDCS Referring Phys: [id number] Deno Lunger SHALHOUB IMPRESSIONS  1. Left ventricular ejection fraction, by estimation, is 60 to 65%. The left ventricle has normal function. The left ventricle has no regional wall motion abnormalities. There is mild concentric left ventricular hypertrophy. Left ventricular diastolic parameters are consistent with Grade I diastolic dysfunction (impaired relaxation).  2. Right ventricular systolic function is normal. The right ventricular size is normal.  3. The mitral valve is normal in structure. Trivial mitral valve regurgitation.  4. The aortic valve is tricuspid. Aortic valve regurgitation is not visualized.  5. The inferior vena cava is normal in size with greater than 50% respiratory variability, suggesting right atrial pressure of 3 mmHg.  6. Agitated saline contrast bubble study was negative, with no evidence of any interatrial shunt. Comparison(s): No prior Echocardiogram. Conclusion(s)/Recommendation(s): No intracardiac source of embolism detected  on this transthoracic study. A transesophageal echocardiogram is recommended to exclude cardiac source of embolism if clinically indicated. FINDINGS  Left Ventricle: Left ventricular ejection fraction, by estimation, is 60 to 65%. The left ventricle has normal function. The left ventricle has no regional wall motion abnormalities. The left ventricular internal cavity size was normal in size. There is  mild concentric left ventricular hypertrophy. Left ventricular diastolic parameters are consistent with Grade I diastolic dysfunction (impaired relaxation). Right Ventricle: The right ventricular size is normal. No increase in right ventricular wall thickness. Right ventricular systolic function is normal. Left Atrium: Left atrial size was normal in size. Right Atrium: Right atrial size was normal in size. Pericardium: There is no evidence of pericardial effusion.  Mitral Valve: The mitral valve is normal in structure. Trivial mitral valve regurgitation. Tricuspid Valve: The tricuspid valve is normal in structure. Tricuspid valve regurgitation is trivial. Aortic Valve: The aortic valve is tricuspid. Aortic valve regurgitation is not visualized. Pulmonic Valve: The pulmonic valve was normal in structure. Pulmonic valve regurgitation is trivial. Aorta: The aortic root and ascending aorta are structurally normal, with no evidence of dilitation. Venous: The inferior vena cava is normal in size with greater than 50% respiratory variability, suggesting right atrial pressure of 3 mmHg. IAS/Shunts: No atrial level shunt detected by color flow Doppler. Agitated saline contrast was given intravenously to evaluate for intracardiac shunting. Agitated saline contrast bubble study was negative, with no evidence of any interatrial shunt.  LEFT VENTRICLE PLAX 2D LVIDd:         5.30 cm  Diastology LVIDs:         3.50 cm  LV e' medial:    6.09 cm/s LV PW:         1.00 cm  LV E/e' medial:  10.8 LV IVS:        1.00 cm  LV e' lateral:   5.11 cm/s LVOT diam:     2.00 cm  LV E/e' lateral: 12.9 LV SV:         63 LV SV Index:   30 LVOT Area:     3.14 cm  RIGHT VENTRICLE RV S prime:     13.40 cm/s TAPSE (M-mode): 2.5 cm LEFT ATRIUM             Index       RIGHT ATRIUM           Index LA diam:        3.90 cm 1.87 cm/m  RA Area:     16.90 cm LA Vol (A2C):   41.6 ml 19.98 ml/m RA Volume:   45.60 ml  21.90 ml/m LA Vol (A4C):   45.1 ml 21.66 ml/m LA Biplane Vol: 44.5 ml 21.37 ml/m  AORTIC VALVE LVOT Vmax:   79.10 cm/s LVOT Vmean:  58.600 cm/s LVOT VTI:    0.200 m  AORTA Ao Root diam: 2.80 cm Ao Asc diam:  2.90 cm MITRAL VALVE MV Area (PHT): 3.60 cm    SHUNTS MV Decel Time: 211 msec    Systemic VTI:  0.20 m MV E velocity: 66.00 cm/s  Systemic Diam: 2.00 cm MV A velocity: 70.30 cm/s MV E/A ratio:  0.94 Laurance Flatten MD Electronically signed by Laurance Flatten MD Signature Date/Time:  04/25/2020/2:13:50 PM    Final     Scheduled Meds: .  stroke: mapping our early stages of recovery book   Does not apply Once  . aspirin EC  81 mg Oral Daily  .  insulin aspart  0-15 Units Subcutaneous TID AC & HS  . insulin aspart  6 Units Subcutaneous TID WC  . insulin glargine  20 Units Subcutaneous QHS  . nicotine  14 mg Transdermal Daily  . tamsulosin  0.4 mg Oral QPC breakfast   Continuous Infusions:   LOS: 0 days   Rickey Barbara, MD [facility] Pager On Amion  If 7PM-7AM, please contact night-coverage 04/25/2020, 4:51 PM

## 2020-04-25 NOTE — Evaluation (Signed)
Physical Therapy Evaluation Patient Details Name: Ryan Hughes MRN: [id number] DOB: [birth year] Today's Date: 04/25/2020   History of Present Illness  54 year old male with past medical history of medical noncompliance, nicotine dependence, benign prostatic hyperplasia, hyperlipidemia, diabetic polyneuropathy and insulin-dependent diabetes mellitus type 2 who presents to [facility] emergency department via EMS with complaints of right arm tingling. Imaging showed acute L thalamic stroke.  Clinical Impression  Pt is independent with transfers, bed mobility, and ambulating 200' without an assistive device, no loss of balance. Balance, mobility, and RUE strength coordination all appear WNL, only symptom is R hand tingling. Pt is able to functionally use R hand to open lids on his lunch tray, open packet of dressing, pick up a piece of paper, and hold a cup. Will sign off. No further acute PT needed.     Follow Up Recommendations No PT follow up    Equipment Recommendations  None recommended by PT    Recommendations for Other Services       Precautions / Restrictions Precautions Precautions: None Precaution Comments: denies falls in past 1 year Restrictions Weight Bearing Restrictions: No      Mobility  Bed Mobility Overal bed mobility: Independent                  Transfers Overall transfer level: Independent                  Ambulation/Gait Ambulation/Gait assistance: Independent Gait Distance (Feet): 200 Feet Assistive device: None Gait Pattern/deviations: WFL(Within Functional Limits) Gait velocity: WNL   General Gait Details: steady, no loss of balance  Stairs            Wheelchair Mobility    Modified Rankin (Stroke Patients Only) Modified Rankin (Stroke Patients Only) Pre-Morbid Rankin Score: No symptoms Modified Rankin: No symptoms     Balance Overall balance assessment: Independent (able to turn 360*, reach to floor, stand  with feet together without LOB)                                           Pertinent Vitals/Pain Pain Assessment: No/denies pain    Home Living Family/patient expects to be discharged to:: Private residence Living Arrangements: Spouse/significant other Available Help at Discharge: Family;Available 24 hours/day   Home Access: Level entry     Home Layout: One level Home Equipment: None      Prior Function Level of Independence: Independent               Hand Dominance   Dominant Hand: Right    Extremity/Trunk Assessment   Upper Extremity Assessment Upper Extremity Assessment: Overall WFL for tasks assessed;RUE deficits/detail (R hand tingling. Coordination and strength of R hand WNL.) RUE Deficits / Details: R hand tingly. strength and coordination R hand WNL. Pt able to perform thumb to each fingertip, open lids on lunch tray, hold cup with R hand. RUE Sensation: decreased light touch RUE Coordination: WNL    Lower Extremity Assessment Lower Extremity Assessment: Overall WFL for tasks assessed    Cervical / Trunk Assessment Cervical / Trunk Assessment: Normal  Communication   Communication: No difficulties  Cognition Arousal/Alertness: Awake/alert Behavior During Therapy: WFL for tasks assessed/performed Overall Cognitive Status: Within Functional Limits for tasks assessed  General Comments      Exercises     Assessment/Plan    PT Assessment Patent does not need any further PT services  PT Problem List         PT Treatment Interventions      PT Goals (Current goals can be found in the Care Plan section)  Acute Rehab PT Goals PT Goal Formulation: All assessment and education complete, DC therapy    Frequency     Barriers to discharge        Co-evaluation               AM-PAC PT "6 Clicks" Mobility  Outcome Measure Help needed turning from your back to your  side while in a flat bed without using bedrails?: None Help needed moving from lying on your back to sitting on the side of a flat bed without using bedrails?: None Help needed moving to and from a bed to a chair (including a wheelchair)?: None Help needed standing up from a chair using your arms (e.g., wheelchair or bedside chair)?: None Help needed to walk in hospital room?: None Help needed climbing 3-5 steps with a railing? : None 6 Click Score: 24    End of Session Equipment Utilized During Treatment: Gait belt Activity Tolerance: Patient tolerated treatment well Patient left: in bed;with call bell/phone within reach;with family/visitor present Nurse Communication: Mobility status      Time: 1208-1221 PT Time Calculation (min) (ACUTE ONLY): 13 min   Charges:   PT Evaluation $PT Eval Low Complexity: 1 Low         Tamala Ser PT 04/25/2020  Acute Rehabilitation Services Pager [phone] Office [phone]

## 2020-04-25 NOTE — Progress Notes (Signed)
Patient was seen and examined. Full noted will follow

## 2020-04-25 NOTE — ED Notes (Signed)
[facility] called for transport. 

## 2020-04-25 NOTE — ED Notes (Signed)
Initial contact with pt. Pt is at bedside on phone watching tv, no complaints. Pt is awaiting transportation to [facility]. No complaints at the moment will continue to monitor.

## 2020-04-26 DIAGNOSIS — L0231 Cutaneous abscess of buttock: Secondary | ICD-10-CM

## 2020-04-26 DIAGNOSIS — I63212 Cerebral infarction due to unspecified occlusion or stenosis of left vertebral arteries: Secondary | ICD-10-CM

## 2020-04-26 DIAGNOSIS — I6322 Cerebral infarction due to unspecified occlusion or stenosis of basilar arteries: Secondary | ICD-10-CM

## 2020-04-26 LAB — COMPREHENSIVE METABOLIC PANEL
ALT: 14 U/L (ref 0–44)
AST: 11 U/L — ABNORMAL LOW (ref 15–41)
Albumin: 3.2 g/dL — ABNORMAL LOW (ref 3.5–5.0)
Alkaline Phosphatase: 68 U/L (ref 38–126)
Anion gap: 10 (ref 5–15)
BUN: 8 mg/dL (ref 6–20)
CO2: 26 mmol/L (ref 22–32)
Calcium: 9.1 mg/dL (ref 8.9–10.3)
Chloride: 103 mmol/L (ref 98–111)
Creatinine, Ser: 0.66 mg/dL (ref 0.61–1.24)
GFR, Estimated: >60 mL/min (ref >60)
Glucose, Bld: 262 mg/dL — ABNORMAL HIGH (ref 70–99)
Potassium: 3.3 mmol/L — ABNORMAL LOW (ref 3.5–5.1)
Sodium: 139 mmol/L (ref 135–145)
Total Bilirubin: 0.7 mg/dL (ref 0.3–1.2)
Total Protein: 6.1 g/dL — ABNORMAL LOW (ref 6.5–8.1)

## 2020-04-26 LAB — GLUCOSE, CAPILLARY
Glucose-Capillary: 141 mg/dL — ABNORMAL HIGH (ref 70–99)
Glucose-Capillary: 194 mg/dL — ABNORMAL HIGH (ref 70–99)
Glucose-Capillary: 201 mg/dL — ABNORMAL HIGH (ref 70–99)
Glucose-Capillary: 203 mg/dL — ABNORMAL HIGH (ref 70–99)
Glucose-Capillary: 235 mg/dL — ABNORMAL HIGH (ref 70–99)

## 2020-04-26 LAB — CBC
HCT: 40.9 % (ref 39.0–52.0)
Hemoglobin: 13.9 g/dL (ref 13.0–17.0)
MCH: 30.7 pg (ref 26.0–34.0)
MCHC: 34 g/dL (ref 30.0–36.0)
MCV: 90.3 fL (ref 80.0–100.0)
Platelets: 245 10*3/uL (ref 150–400)
RBC: 4.53 MIL/uL (ref 4.22–5.81)
RDW: 13.1 % (ref 11.5–15.5)
WBC: 7.3 10*3/uL (ref 4.0–10.5)
nRBC: 0 % (ref 0.0–0.2)

## 2020-04-26 MED ORDER — SULFAMETHOXAZOLE-TRIMETHOPRIM 800-160 MG PO TABS
2.0000 | ORAL_TABLET | Freq: Two times a day (BID) | ORAL | Status: DC
  Administered 2020-04-26 – 2020-04-27 (×4): 2 via ORAL
  Filled 2020-04-26 (×6): qty 2

## 2020-04-26 MED ORDER — POTASSIUM CHLORIDE CRYS ER 20 MEQ PO TBCR
40.0000 meq | EXTENDED_RELEASE_TABLET | Freq: Once | ORAL | Status: AC
  Administered 2020-04-26: 09:00:00 40 meq via ORAL
  Filled 2020-04-26: qty 2

## 2020-04-26 NOTE — Progress Notes (Signed)
Cross-coverage note:   Was asked to assess patient for a pustule on his bottom.   He is a right-handed 56 yom with uncontrolled DM who presented the night of 12/17 with right hand tingling, was found on MRI to have small focus of acute ischemia within the dorsolateral left thalamus and was admitted for that reason.   Pt reports developing a sore spot in the gluteal cleft 3-4 days ago that has drained spontaneously. There is a boil with surrounding induration of ~3 cm in superior gluteal cleft on the right. No systemic infectious findings and has already drained but given his uncontrolled DM, recommend treating with antibiotics. He denies drug allergy. Bactrim ordered.

## 2020-04-26 NOTE — Progress Notes (Signed)
Initial VS :Pt a transfer from [facility] , alert and oriented x4  MAE  ,c/o numbness and tingling in rt hand & fingers

## 2020-04-26 NOTE — Consult Note (Signed)
Referring Physician: Dr. Allena Katz    Chief Complaint: Right hand paresthesias  HPI: Ryan Hughes is an 54 y.o. male with uncontrolled DM and smoking who presented to the [facility] ED on Friday with acute onset of right hand paresthesias. He stopped taking his insulin about one year ago. His CBG was 488 on arrival and HgbA1c came back severely elevated at 14.2. 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   He has been started on ASA and atorvastatin this admission.   Past Medical History:  Diagnosis Date  . Diabetes mellitus without complication (HCC)   . Nicotine dependence, cigarettes, uncomplicated 04/25/2020    History reviewed. No pertinent surgical history.  Family History  Problem Relation Age of Onset  . Stroke Mother    Social History:  reports that he has been smoking cigarettes. He has been smoking about 0.50 packs per day. He has never used smokeless tobacco. He reports current alcohol use. He reports current drug use. Drug: Marijuana.  Allergies: No Known Allergies  Medications:  Prior to Admission:  Medications Prior to Admission  Medication Sig Dispense Refill Last Dose  . docusate sodium (COLACE) 100 MG capsule Take 1 capsule (100 mg total) by mouth every 12 (twelve) hours. (Patient not taking: No sig reported) 60 capsule 0 Not Taking at Unknown time  . naproxen (NAPROSYN) 375 MG tablet Take 1 tablet (375 mg total) by mouth 2 (two) times daily. (Patient not taking: Reported on 04/25/2020) 10 tablet 0 Not Taking at Unknown time   Scheduled: .  stroke: mapping our early stages of recovery book   Does not apply Once  . aspirin EC  81 mg Oral Daily  . atorvastatin  40 mg Oral Daily  . insulin aspart  0-15 Units Subcutaneous TID AC & HS  . insulin aspart  6 Units Subcutaneous TID WC  . insulin glargine  20 Units Subcutaneous QHS  . nicotine  14 mg Transdermal Daily  . potassium chloride  40 mEq Oral Once  . sulfamethoxazole-trimethoprim   2 tablet Oral Q12H  . tamsulosin  0.4 mg Oral QPC breakfast    ROS: As per HPI. Comprehensive review of symptoms reveals no additional neurological complaints.   Physical Examination: Blood pressure (!) 179/89, pulse 65, temperature (!) 97.5 F (36.4 C), temperature source Oral, resp. rate 17, height 5\' 6"  (1.676 m), weight 93.8 kg, SpO2 98 %.  HEENT: [place]/AT Lungs: Respirations unlabored.  Ext: No edema  Neurologic Examination: Mental Status: Awake and alert, oriented x 5, thought content appropriate.  Speech fluent without evidence of aphasia.  Able to follow all commands without difficulty. Cranial Nerves: II:  Visual fields intact with no extinction to DSS. PERRL.  III,IV, VI: No ptosis. EOMI.  V,VII: Smile symmetric, facial temp sensation equal bilaterally VIII: hearing intact to conversation IX,X: No hypophonia XI: Symmetric shoulder shrug XII: Midline tongue extension  Motor: Right : Upper extremity   5/5    Left:     Upper extremity   5/5  Lower extremity   5/5     Lower extremity   5/5 No pronator drift Sensory: Dysesthesia with FT stimulation of dorsal and palmar aspects of the right hand. FT otherwise intact x 4. Temp sensation intact x 4, including the right hand.  Deep Tendon Reflexes:  1+ bilateral brachioradialis and patellae. 0 achilles bilaterally.  Plantars: Right: downgoing   Left: downgoing Cerebellar: No ataxia with FNF bilaterally  Gait: Deferred  Results for orders placed  or performed during the hospital encounter of 04/24/20 (from the past 48 hour(s))  CBC with Differential/Platelet     Status: Abnormal   Collection Time: 04/24/20  5:00 PM  Result Value Ref Range   WBC 6.4 4.0 - 10.5 K/uL   RBC 4.06 (L) 4.22 - 5.81 MIL/uL   Hemoglobin 12.8 (L) 13.0 - 17.0 g/dL   HCT 40.1 (L) 02.7 - 25.3 %   MCV 93.3 80.0 - 100.0 fL   MCH 31.5 26.0 - 34.0 pg   MCHC 33.8 30.0 - 36.0 g/dL   RDW 66.4 40.3 - 47.4 %   Platelets 225 150 - 400 K/uL   nRBC 0.0 0.0 - 0.2 %    Neutrophils Relative % 51 %   Neutro Abs 3.2 1.7 - 7.7 K/uL   Lymphocytes Relative 42 %   Lymphs Abs 2.7 0.7 - 4.0 K/uL   Monocytes Relative 6 %   Monocytes Absolute 0.4 0.1 - 1.0 K/uL   Eosinophils Relative 1 %   Eosinophils Absolute 0.1 0.0 - 0.5 K/uL   Basophils Relative 0 %   Basophils Absolute 0.0 0.0 - 0.1 K/uL   Immature Granulocytes 0 %   Abs Immature Granulocytes 0.01 0.00 - 0.07 K/uL    Comment: Performed at [facility], [address], [place], [place] [address]  Comprehensive metabolic panel     Status: Abnormal   Collection Time: 04/24/20  5:00 PM  Result Value Ref Range   Sodium 135 135 - 145 mmol/L   Potassium 3.6 3.5 - 5.1 mmol/L   Chloride 98 98 - 111 mmol/L   CO2 26 22 - 32 mmol/L   Glucose, Bld 389 (H) 70 - 99 mg/dL    Comment: Glucose reference range applies only to samples taken after fasting for at least 8 hours.   BUN 11 6 - 20 mg/dL   Creatinine, Ser 3.87 0.61 - 1.24 mg/dL   Calcium 8.8 (L) 8.9 - 10.3 mg/dL   Total Protein 7.0 6.5 - 8.1 g/dL   Albumin 3.8 3.5 - 5.0 g/dL   AST 16 15 - 41 U/L   ALT 13 0 - 44 U/L   Alkaline Phosphatase 73 38 - 126 U/L   Total Bilirubin 0.4 0.3 - 1.2 mg/dL   GFR, Estimated >56 >43 mL/min    Comment: (NOTE) Calculated using the CKD-EPI Creatinine Equation (2021)    Anion gap 11 5 - 15    Comment: Performed at [facility], [address], [place], [place] [address]  CBG monitoring, ED     Status: Abnormal   Collection Time: 04/24/20  5:09 PM  Result Value Ref Range   Glucose-Capillary 393 (H) 70 - 99 mg/dL    Comment: Glucose reference range applies only to samples taken after fasting for at least 8 hours.   Comment 1 Notify RN   Resp Panel by RT-PCR (Flu A&B, Covid) Nasopharyngeal Swab     Status: None   Collection Time: 04/24/20  5:50 PM   Specimen: Nasopharyngeal Swab; Nasopharyngeal(NP) swabs in vial transport medium  Result Value Ref Range   SARS Coronavirus 2 by RT PCR  NEGATIVE NEGATIVE    Comment: (NOTE) SARS-CoV-2 target nucleic acids are NOT DETECTED.  The SARS-CoV-2 RNA is generally detectable in upper respiratory specimens during the acute phase of infection. The lowest concentration of SARS-CoV-2 viral copies this assay can detect is 138 copies/mL. A negative result does not preclude SARS-Cov-2 infection and should not be used  as the sole basis for treatment or other patient management decisions. A negative result may occur with  improper specimen collection/handling, submission of specimen other than nasopharyngeal swab, presence of viral mutation(s) within the areas targeted by this assay, and inadequate number of viral copies(<138 copies/mL). A negative result must be combined with clinical observations, patient history, and epidemiological information. The expected result is Negative.  Fact Sheet for Patients:  BloggerCourse.com  Fact Sheet for Healthcare Providers:  SeriousBroker.it  This test is no t yet approved or cleared by the [place] FDA and  has been authorized for detection and/or diagnosis of SARS-CoV-2 by FDA under an Emergency Use Authorization (EUA). This EUA will remain  in effect (meaning this test can be used) for the duration of the COVID-19 declaration under Section 564(b)(1) of the Act, 21 U.S.C.section 360bbb-3(b)(1), unless the authorization is terminated  or revoked sooner.       Influenza A by PCR NEGATIVE NEGATIVE   Influenza B by PCR NEGATIVE NEGATIVE    Comment: (NOTE) The Xpert Xpress SARS-CoV-2/FLU/RSV plus assay is intended as an aid in the diagnosis of influenza from Nasopharyngeal swab specimens and should not be used as a sole basis for treatment. Nasal washings and aspirates are unacceptable for Xpert Xpress SARS-CoV-2/FLU/RSV testing.  Fact Sheet for Patients: BloggerCourse.com  Fact Sheet for Healthcare  Providers: SeriousBroker.it  This test is not yet approved or cleared by the [place] FDA and has been authorized for detection and/or diagnosis of SARS-CoV-2 by FDA under an Emergency Use Authorization (EUA). This EUA will remain in effect (meaning this test can be used) for the duration of the COVID-19 declaration under Section 564(b)(1) of the Act, 21 U.S.C. section 360bbb-3(b)(1), unless the authorization is terminated or revoked.  Performed at [facility], [address], [place], [place] [address]   Urinalysis, Routine w reflex microscopic     Status: Abnormal   Collection Time: 04/24/20  8:01 PM  Result Value Ref Range   Color, Urine YELLOW YELLOW   APPearance CLEAR CLEAR   Specific Gravity, Urine 1.030 1.005 - 1.030   pH 5.0 5.0 - 8.0   Glucose, UA >=500 (A) NEGATIVE mg/dL   Hgb urine dipstick NEGATIVE NEGATIVE   Bilirubin Urine NEGATIVE NEGATIVE   Ketones, ur NEGATIVE NEGATIVE mg/dL   Protein, ur NEGATIVE NEGATIVE mg/dL   Nitrite NEGATIVE NEGATIVE   Leukocytes,Ua NEGATIVE NEGATIVE   RBC / HPF 0-5 0 - 5 RBC/hpf   WBC, UA 0-5 0 - 5 WBC/hpf   Bacteria, UA NONE SEEN NONE SEEN    Comment: Performed at [facility], [address], [place], [place] [address]  CBG monitoring, ED     Status: Abnormal   Collection Time: 04/24/20  9:59 PM  Result Value Ref Range   Glucose-Capillary 177 (H) 70 - 99 mg/dL    Comment: Glucose reference range applies only to samples taken after fasting for at least 8 hours.   Comment 1 Notify RN   HIV Antibody (routine testing w rflx)     Status: None   Collection Time: 04/25/20  2:35 AM  Result Value Ref Range   HIV Screen 4th Generation wRfx Non Reactive Non Reactive    Comment: Performed at [facility], [address], [place], [place] [address]  Hemoglobin A1c     Status: Abnormal   Collection Time: 04/25/20  4:17 AM  Result Value Ref Range   Hgb A1c MFr Bld 14.2  (H)  4.8 - 5.6 %    Comment: (NOTE) Pre diabetes:          5.7%-6.4%  Diabetes:              >6.4%  Glycemic control for   <7.0% adults with diabetes    Mean Plasma Glucose 360.84 mg/dL    Comment: Performed at [facility], [address] [place], [place] [address]  Lipid panel     Status: Abnormal   Collection Time: 04/25/20  4:17 AM  Result Value Ref Range   Cholesterol 205 (H) 0 - 200 mg/dL   Triglycerides 604 (H) <150 mg/dL   HDL 38 (L) >54 mg/dL   Total CHOL/HDL Ratio 5.4 RATIO   VLDL 37 0 - 40 mg/dL   LDL Cholesterol 098 (H) 0 - 99 mg/dL    Comment:        Total Cholesterol/HDL:CHD Risk Coronary Heart Disease Risk Table                     Men   Women  1/2 Average Risk   3.4   3.3  Average Risk       5.0   4.4  2 X Average Risk   9.6   7.1  3 X Average Risk  23.4   11.0        Use the calculated Patient Ratio above and the CHD Risk Table to determine the patient's CHD Risk.        ATP III CLASSIFICATION (LDL):  <100     mg/dL   Optimal  119-147  mg/dL   Near or Above                    Optimal  130-159  mg/dL   Borderline  829-562  mg/dL   High  >130     mg/dL   Very High Performed at [facility], [address], [place], [place] [address]   CBC     Status: None   Collection Time: 04/25/20  4:17 AM  Result Value Ref Range   WBC 6.5 4.0 - 10.5 K/uL   RBC 4.40 4.22 - 5.81 MIL/uL   Hemoglobin 13.9 13.0 - 17.0 g/dL   HCT 46.9 62.9 - 52.8 %   MCV 92.3 80.0 - 100.0 fL   MCH 31.6 26.0 - 34.0 pg   MCHC 34.2 30.0 - 36.0 g/dL   RDW 41.3 24.4 - 01.0 %   Platelets 238 150 - 400 K/uL   nRBC 0.0 0.0 - 0.2 %    Comment: Performed at [facility], [address] [place], [place] [address]  Comprehensive metabolic panel     Status: Abnormal   Collection Time: 04/25/20  4:17 AM  Result Value Ref Range   Sodium 136 135 - 145 mmol/L   Potassium 3.5 3.5 - 5.1 mmol/L   Chloride 100 98 - 111 mmol/L   CO2 26 22 - 32 mmol/L    Glucose, Bld 358 (H) 70 - 99 mg/dL    Comment: Glucose reference range applies only to samples taken after fasting for at least 8 hours.   BUN 10 6 - 20 mg/dL   Creatinine, Ser 6.64 0.61 - 1.24 mg/dL   Calcium 8.8 (L) 8.9 - 10.3 mg/dL   Total Protein 6.4 (L) 6.5 - 8.1 g/dL   Albumin 3.6 3.5 - 5.0 g/dL   AST 11 (L) 15 - 41 U/L  ALT 13 0 - 44 U/L   Alkaline Phosphatase 73 38 - 126 U/L   Total Bilirubin 0.6 0.3 - 1.2 mg/dL   GFR, Estimated >16 >10 mL/min    Comment: (NOTE) Calculated using the CKD-EPI Creatinine Equation (2021)    Anion gap 10 5 - 15    Comment: Performed at [facility], [address] [place], [place] [address]  CBG monitoring, ED     Status: Abnormal   Collection Time: 04/25/20  9:55 AM  Result Value Ref Range   Glucose-Capillary 390 (H) 70 - 99 mg/dL    Comment: Glucose reference range applies only to samples taken after fasting for at least 8 hours.  CBG monitoring, ED     Status: Abnormal   Collection Time: 04/25/20 12:03 PM  Result Value Ref Range   Glucose-Capillary 256 (H) 70 - 99 mg/dL    Comment: Glucose reference range applies only to samples taken after fasting for at least 8 hours.   Comment 1 Notify RN    Comment 2 Document in Chart   CBG monitoring, ED     Status: Abnormal   Collection Time: 04/25/20  5:35 PM  Result Value Ref Range   Glucose-Capillary 193 (H) 70 - 99 mg/dL    Comment: Glucose reference range applies only to samples taken after fasting for at least 8 hours.  Glucose, capillary     Status: Abnormal   Collection Time: 04/25/20  9:00 PM  Result Value Ref Range   Glucose-Capillary 214 (H) 70 - 99 mg/dL    Comment: Glucose reference range applies only to samples taken after fasting for at least 8 hours.  Glucose, capillary     Status: Abnormal   Collection Time: 04/26/20 12:00 AM  Result Value Ref Range   Glucose-Capillary 203 (H) 70 - 99 mg/dL    Comment: Glucose reference range applies only to samples taken  after fasting for at least 8 hours.  Comprehensive metabolic panel     Status: Abnormal   Collection Time: 04/26/20  1:20 AM  Result Value Ref Range   Sodium 139 135 - 145 mmol/L   Potassium 3.3 (L) 3.5 - 5.1 mmol/L   Chloride 103 98 - 111 mmol/L   CO2 26 22 - 32 mmol/L   Glucose, Bld 262 (H) 70 - 99 mg/dL    Comment: Glucose reference range applies only to samples taken after fasting for at least 8 hours.   BUN 8 6 - 20 mg/dL   Creatinine, Ser 4.09 0.61 - 1.24 mg/dL   Calcium 9.1 8.9 - 81.1 mg/dL   Total Protein 6.1 (L) 6.5 - 8.1 g/dL   Albumin 3.2 (L) 3.5 - 5.0 g/dL   AST 11 (L) 15 - 41 U/L   ALT 14 0 - 44 U/L   Alkaline Phosphatase 68 38 - 126 U/L   Total Bilirubin 0.7 0.3 - 1.2 mg/dL   GFR, Estimated >91 >47 mL/min    Comment: (NOTE) Calculated using the CKD-EPI Creatinine Equation (2021)    Anion gap 10 5 - 15    Comment: Performed at [facility], [address] [place], [place] [address]  CBC     Status: None   Collection Time: 04/26/20  1:20 AM  Result Value Ref Range   WBC 7.3 4.0 - 10.5 K/uL   RBC 4.53 4.22 - 5.81 MIL/uL   Hemoglobin 13.9 13.0 - 17.0 g/dL   HCT 21.3 08.6 - 57.8 %  MCV 90.3 80.0 - 100.0 fL   MCH 30.7 26.0 - 34.0 pg   MCHC 34.0 30.0 - 36.0 g/dL   RDW 53.6 14.4 - 31.5 %   Platelets 245 150 - 400 K/uL   nRBC 0.0 0.0 - 0.2 %    Comment: Performed at [facility], [address], [place], [place] [address]  Glucose, capillary     Status: Abnormal   Collection Time: 04/26/20  6:42 AM  Result Value Ref Range   Glucose-Capillary 194 (H) 70 - 99 mg/dL    Comment: Glucose reference range applies only to samples taken after fasting for at least 8 hours.   CT ANGIO HEAD W OR WO CONTRAST  Result Date: 04/25/2020 CLINICAL DATA:  Right hand tingling EXAM: CT ANGIOGRAPHY HEAD AND NECK TECHNIQUE: Multidetector CT imaging of the head and neck was performed using the standard protocol during bolus administration of intravenous contrast. Multiplanar  CT image reconstructions and MIPs were obtained to evaluate the vascular anatomy. Carotid stenosis measurements (when applicable) are obtained utilizing NASCET criteria, using the distal internal carotid diameter as the denominator. CONTRAST:  OMNIPAQUE IOHEXOL 350 MG/ML SOLN COMPARISON:  None. FINDINGS: CT HEAD FINDINGS Brain: There is no mass, hemorrhage or extra-axial collection. The size and configuration of the ventricles and extra-axial CSF spaces are normal. There is no acute or chronic infarction. The brain parenchyma is normal. Skull: The visualized skull base, calvarium and extracranial soft tissues are normal. Sinuses/Orbits: No fluid levels or advanced mucosal thickening of the visualized paranasal sinuses. No mastoid or middle ear effusion. The orbits are normal. CTA NECK FINDINGS SKELETON: There is no bony spinal canal stenosis. No lytic or blastic lesion. OTHER NECK: Normal pharynx, larynx and major salivary glands. No cervical lymphadenopathy. Unremarkable thyroid gland. UPPER CHEST: No pneumothorax or pleural effusion. No nodules or masses. AORTIC ARCH: There is calcific atherosclerosis of the aortic arch. There is no aneurysm, dissection or hemodynamically significant stenosis of the visualized portion of the aorta. Conventional 3 vessel aortic branching pattern. The visualized proximal subclavian arteries are widely patent. RIGHT CAROTID SYSTEM: There is moderate stenosis of the proximal right common carotid artery. No hemodynamically significant stenosis of the internal carotid artery by NASCET criteria. LEFT CAROTID SYSTEM: No dissection, occlusion or aneurysm. Mild atherosclerotic calcification at the carotid bifurcation without hemodynamically significant stenosis. VERTEBRAL ARTERIES: Codominant configuration. Both origins are clearly patent. There is no dissection, occlusion or flow-limiting stenosis to the skull base (V1-V3 segments). CTA HEAD FINDINGS POSTERIOR CIRCULATION:  --Vertebral arteries: Normal V4 segments. --Inferior cerebellar arteries: Normal. --Basilar artery: Normal. --Superior cerebellar arteries: Normal. --Posterior cerebral arteries (PCA): Normal. ANTERIOR CIRCULATION: --Intracranial internal carotid arteries: Normal. --Anterior cerebral arteries (ACA): Normal. Both A1 segments are present. Patent anterior communicating artery (a-comm). --Middle cerebral arteries (MCA): Normal. VENOUS SINUSES: As permitted by contrast timing, patent. ANATOMIC VARIANTS: None Review of the MIP images confirms the above findings. IMPRESSION: 1. No emergent large vessel occlusion or high-grade stenosis of the intracranial arteries. 2. Moderate stenosis of the proximal right common carotid artery. 3. Mild left carotid bifurcation atherosclerosis without hemodynamically significant stenosis. Aortic atherosclerosis (ICD10-I70.0). Electronically Signed   By: Deatra Robinson M.D.   On: 04/25/2020 03:42   CT ANGIO NECK W OR WO CONTRAST  Result Date: 04/25/2020 CLINICAL DATA:  Right hand tingling EXAM: CT ANGIOGRAPHY HEAD AND NECK TECHNIQUE: Multidetector CT imaging of the head and neck was performed using the standard protocol during bolus administration of intravenous contrast. Multiplanar CT image  reconstructions and MIPs were obtained to evaluate the vascular anatomy. Carotid stenosis measurements (when applicable) are obtained utilizing NASCET criteria, using the distal internal carotid diameter as the denominator. CONTRAST:  OMNIPAQUE IOHEXOL 350 MG/ML SOLN COMPARISON:  None. FINDINGS: CT HEAD FINDINGS Brain: There is no mass, hemorrhage or extra-axial collection. The size and configuration of the ventricles and extra-axial CSF spaces are normal. There is no acute or chronic infarction. The brain parenchyma is normal. Skull: The visualized skull base, calvarium and extracranial soft tissues are normal. Sinuses/Orbits: No fluid levels or advanced mucosal thickening of the visualized  paranasal sinuses. No mastoid or middle ear effusion. The orbits are normal. CTA NECK FINDINGS SKELETON: There is no bony spinal canal stenosis. No lytic or blastic lesion. OTHER NECK: Normal pharynx, larynx and major salivary glands. No cervical lymphadenopathy. Unremarkable thyroid gland. UPPER CHEST: No pneumothorax or pleural effusion. No nodules or masses. AORTIC ARCH: There is calcific atherosclerosis of the aortic arch. There is no aneurysm, dissection or hemodynamically significant stenosis of the visualized portion of the aorta. Conventional 3 vessel aortic branching pattern. The visualized proximal subclavian arteries are widely patent. RIGHT CAROTID SYSTEM: There is moderate stenosis of the proximal right common carotid artery. No hemodynamically significant stenosis of the internal carotid artery by NASCET criteria. LEFT CAROTID SYSTEM: No dissection, occlusion or aneurysm. Mild atherosclerotic calcification at the carotid bifurcation without hemodynamically significant stenosis. VERTEBRAL ARTERIES: Codominant configuration. Both origins are clearly patent. There is no dissection, occlusion or flow-limiting stenosis to the skull base (V1-V3 segments). CTA HEAD FINDINGS POSTERIOR CIRCULATION: --Vertebral arteries: Normal V4 segments. --Inferior cerebellar arteries: Normal. --Basilar artery: Normal. --Superior cerebellar arteries: Normal. --Posterior cerebral arteries (PCA): Normal. ANTERIOR CIRCULATION: --Intracranial internal carotid arteries: Normal. --Anterior cerebral arteries (ACA): Normal. Both A1 segments are present. Patent anterior communicating artery (a-comm). --Middle cerebral arteries (MCA): Normal. VENOUS SINUSES: As permitted by contrast timing, patent. ANATOMIC VARIANTS: None Review of the MIP images confirms the above findings. IMPRESSION: 1. No emergent large vessel occlusion or high-grade stenosis of the intracranial arteries. 2. Moderate stenosis of the proximal right common carotid  artery. 3. Mild left carotid bifurcation atherosclerosis without hemodynamically significant stenosis. Aortic atherosclerosis (ICD10-I70.0). Electronically Signed   By: Deatra Robinson M.D.   On: 04/25/2020 03:42   MR BRAIN WO CONTRAST  Result Date: 04/24/2020 CLINICAL DATA:  Right hand tingling EXAM: MRI HEAD WITHOUT CONTRAST TECHNIQUE: Multiplanar, multiecho pulse sequences of the brain and surrounding structures were obtained without intravenous contrast. COMPARISON:  None. FINDINGS: Brain: There is a small focus of abnormal diffusion restriction within the dorsolateral left thalamus. No acute or chronic hemorrhage. There is multifocal hyperintense T2-weighted signal within the white matter. Parenchymal volume and CSF spaces are normal. There is an old right basal ganglia small vessel infarct. Vascular: Major flow voids are preserved. Skull and upper cervical spine: Normal calvarium and skull base. Visualized upper cervical spine and soft tissues are normal. Sinuses/Orbits:No paranasal sinus fluid levels or advanced mucosal thickening. Small amount of left mastoid fluid. Normal orbits. IMPRESSION: 1. Small focus of acute ischemia within the dorsolateral left thalamus. No hemorrhage or mass effect. 2. Old right basal ganglia small vessel infarct. Electronically Signed   By: Deatra Robinson M.D.   On: 04/24/2020 23:53   DG Chest Port 1 View  Result Date: 04/24/2020 CLINICAL DATA:  Weakness. EXAM: PORTABLE CHEST 1 VIEW COMPARISON:  None. FINDINGS: Borderline mild cardiomegaly. Normal mediastinal contours. Vascular congestion without evidence of pulmonary edema. No focal airspace disease. No  pleural fluid or pneumothorax. No acute osseous abnormalities are seen. IMPRESSION: Borderline mild cardiomegaly with vascular congestion. Electronically Signed   By: Narda Rutherford M.D.   On: 04/24/2020 17:39   ECHOCARDIOGRAM COMPLETE BUBBLE STUDY  Result Date: 04/25/2020    ECHOCARDIOGRAM REPORT   Patient Name:    Ryan Hughes Date of Exam: 04/25/2020 Medical Rec #:  [id number]     Height:       66.0 in Accession #:    [id number]    Weight:       220.0 lb Date of Birth:  [birth year]     BSA:          2.082 m Patient Age:    54 years      BP:           158/83 mmHg Patient Gender: M             HR:           63 bpm. Exam Location:  Inpatient Procedure: 2D Echo, Color Doppler, Cardiac Doppler and Saline Contrast Bubble            Study Indications:    Stroke i163.9  History:        Patient has no prior history of Echocardiogram examinations.                 Risk Factors:Diabetes and Dyslipidemia.  Sonographer:    Irving Burton Senior RDCS Referring Phys: [id number] Deno Lunger [place] IMPRESSIONS  1. Left ventricular ejection fraction, by estimation, is 60 to 65%. The left ventricle has normal function. The left ventricle has no regional wall motion abnormalities. There is mild concentric left ventricular hypertrophy. Left ventricular diastolic parameters are consistent with Grade I diastolic dysfunction (impaired relaxation).  2. Right ventricular systolic function is normal. The right ventricular size is normal.  3. The mitral valve is normal in structure. Trivial mitral valve regurgitation.  4. The aortic valve is tricuspid. Aortic valve regurgitation is not visualized.  5. The inferior vena cava is normal in size with greater than 50% respiratory variability, suggesting right atrial pressure of 3 mmHg.  6. Agitated saline contrast bubble study was negative, with no evidence of any interatrial shunt. Comparison(s): No prior Echocardiogram. Conclusion(s)/Recommendation(s): No intracardiac source of embolism detected on this transthoracic study. A transesophageal echocardiogram is recommended to exclude cardiac source of embolism if clinically indicated. FINDINGS  Left Ventricle: Left ventricular ejection fraction, by estimation, is 60 to 65%. The left ventricle has normal function. The left ventricle has no regional wall motion  abnormalities. The left ventricular internal cavity size was normal in size. There is  mild concentric left ventricular hypertrophy. Left ventricular diastolic parameters are consistent with Grade I diastolic dysfunction (impaired relaxation). Right Ventricle: The right ventricular size is normal. No increase in right ventricular wall thickness. Right ventricular systolic function is normal. Left Atrium: Left atrial size was normal in size. Right Atrium: Right atrial size was normal in size. Pericardium: There is no evidence of pericardial effusion. Mitral Valve: The mitral valve is normal in structure. Trivial mitral valve regurgitation. Tricuspid Valve: The tricuspid valve is normal in structure. Tricuspid valve regurgitation is trivial. Aortic Valve: The aortic valve is tricuspid. Aortic valve regurgitation is not visualized. Pulmonic Valve: The pulmonic valve was normal in structure. Pulmonic valve regurgitation is trivial. Aorta: The aortic root and ascending aorta are structurally normal, with no evidence of dilitation. Venous: The inferior vena cava is normal in size with greater than 50% respiratory  variability, suggesting right atrial pressure of 3 mmHg. IAS/Shunts: No atrial level shunt detected by color flow Doppler. Agitated saline contrast was given intravenously to evaluate for intracardiac shunting. Agitated saline contrast bubble study was negative, with no evidence of any interatrial shunt.  LEFT VENTRICLE PLAX 2D LVIDd:         5.30 cm  Diastology LVIDs:         3.50 cm  LV e' medial:    6.09 cm/s LV PW:         1.00 cm  LV E/e' medial:  10.8 LV IVS:        1.00 cm  LV e' lateral:   5.11 cm/s LVOT diam:     2.00 cm  LV E/e' lateral: 12.9 LV SV:         63 LV SV Index:   30 LVOT Area:     3.14 cm  RIGHT VENTRICLE RV S prime:     13.40 cm/s TAPSE (M-mode): 2.5 cm LEFT ATRIUM             Index       RIGHT ATRIUM           Index LA diam:        3.90 cm 1.87 cm/m  RA Area:     16.90 cm LA Vol (A2C):    41.6 ml 19.98 ml/m RA Volume:   45.60 ml  21.90 ml/m LA Vol (A4C):   45.1 ml 21.66 ml/m LA Biplane Vol: 44.5 ml 21.37 ml/m  AORTIC VALVE LVOT Vmax:   79.10 cm/s LVOT Vmean:  58.600 cm/s LVOT VTI:    0.200 m  AORTA Ao Root diam: 2.80 cm Ao Asc diam:  2.90 cm MITRAL VALVE MV Area (PHT): 3.60 cm    SHUNTS MV Decel Time: 211 msec    Systemic VTI:  0.20 m MV E velocity: 66.00 cm/s  Systemic Diam: 2.00 cm MV A velocity: 70.30 cm/s MV E/A ratio:  0.94 Laurance FlattenHeather Pemberton MD Electronically signed by Laurance FlattenHeather Pemberton MD Signature Date/Time: 04/25/2020/2:13:50 PM    Final     Assessment: 54 y.o. male presenting with acute left thalamic lacunar infarction.  1. Exam reveals right hand dysesthesia to fine touch. No other focal deficits appreciated.  2. MRI brain: Small focus of acute ischemia within the dorsolateral left thalamus. No hemorrhage or mass effect. Old right basal ganglia small vessel infarct. 3. CTA of head and neck: No emergent large vessel occlusion or high-grade stenosis of the intracranial arteries. Moderate stenosis of the proximal right common carotid artery. Mild left carotid bifurcation atherosclerosis without hemodynamically significant stenosis. Aortic atherosclerosis. 4. Echocardiogram: No intracardiac source of embolism detected on this transthoracic study. 5. Stroke Risk Factors - DM, smoking, hypercholesterolemia and newly diagnosed HTN  Recommendations: 1. Cardiac telemetry 2. Frequent neuro checks 3. PT consult, OT consult, Speech consult 4. He has been started on ASA and atorvastatin 5. Smoking cessation 6. Needs to purchase a glucometer and return to compliance with glycemic control regimen 7. BP management.  8. Smoking cessation    @Electronically  signed: Dr. Caryl PinaEric Josephus Harriger@  04/26/2020, 7:14 AM

## 2020-04-26 NOTE — Progress Notes (Signed)
STROKE TEAM PROGRESS NOTE   HISTORY OF PRESENT ILLNESS (per record) Ryan Hughes is a 54 y.o. male with uncontrolled DM and smoking who presented to the [facility] ED on Friday with acute onset of right hand paresthesias. He stopped taking his insulin about one year ago. His CBG was 488 on arrival and HgbA1c came back severely elevated at 14.2. 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  He has been started on ASA and atorvastatin this admission.    INTERVAL HISTORY His family is not at bedside. Discussed stroke etiology with patient, lacunar infarct, uncontrolled vascular risk factors.    OBJECTIVE Vitals:   04/25/20 2356 04/26/20 0342 04/26/20 0846 04/26/20 1254  BP: (!) 179/88 (!) 179/89 (!) 154/97 (!) 156/91  Pulse: 70 65 (!) 56 74  Resp: 18 17 18 18   Temp: 98.4 F (36.9 C) (!) 97.5 F (36.4 C) 98.1 F (36.7 C) 97.8 F (36.6 C)  TempSrc: Oral Oral Oral Oral  SpO2: 99% 98% 96% 100%  Weight:      Height:        CBC:  Recent Labs  Lab 04/24/20 1700 04/25/20 0417 04/26/20 0120  WBC 6.4 6.5 7.3  NEUTROABS 3.2  --   --   HGB 12.8* 13.9 13.9  HCT 37.9* 40.6 40.9  MCV 93.3 92.3 90.3  PLT 225 238 245    Basic Metabolic Panel:  Recent Labs  Lab 04/25/20 0417 04/26/20 0120  NA 136 139  K 3.5 3.3*  CL 100 103  CO2 26 26  GLUCOSE 358* 262*  BUN 10 8  CREATININE 0.70 0.66  CALCIUM 8.8* 9.1    Lipid Panel:     Component Value Date/Time   CHOL 205 (H) 04/25/2020 0417   TRIG 185 (H) 04/25/2020 0417   HDL 38 (L) 04/25/2020 0417   CHOLHDL 5.4 04/25/2020 0417   VLDL 37 04/25/2020 0417   LDLCALC 130 (H) 04/25/2020 0417   HgbA1c:  Lab Results  Component Value Date   HGBA1C 14.2 (H) 04/25/2020   Urine Drug Screen:     Component Value Date/Time   LABOPIA NONE DETECTED 07/04/2010 0959   COCAINSCRNUR POSITIVE (A) 07/04/2010 0959   LABBENZ NONE DETECTED 07/04/2010 0959   AMPHETMU NONE DETECTED 07/04/2010 0959   THCU NONE  DETECTED 07/04/2010 0959   LABBARB  07/04/2010 0959    NONE DETECTED        DRUG SCREEN FOR MEDICAL PURPOSES ONLY.  IF CONFIRMATION IS NEEDED FOR ANY PURPOSE, NOTIFY LAB WITHIN 5 DAYS.        LOWEST DETECTABLE LIMITS FOR URINE DRUG SCREEN Drug Class       Cutoff (ng/mL) Amphetamine      1000 Barbiturate      200 Benzodiazepine   200 Tricyclics       300 Opiates          300 Cocaine          300 THC              50    Alcohol Level No results found for: ETH  IMAGING  CT ANGIO HEAD W OR WO CONTRAST CT ANGIO NECK W OR WO CONTRAST 04/25/2020 IMPRESSION:  1. No emergent large vessel occlusion or high-grade stenosis of the intracranial arteries.  2. Moderate stenosis of the proximal right common carotid artery.  3. Mild left carotid bifurcation atherosclerosis without hemodynamically significant stenosis.  Aortic atherosclerosis (ICD10-I70.0).   MR BRAIN WO CONTRAST 04/24/2020  IMPRESSION:  1. Small focus of acute ischemia within the dorsolateral left thalamus. No hemorrhage or mass effect.  2. Old right basal ganglia small vessel infarct.   DG Chest Port 1 View 04/24/2020 IMPRESSION:  Borderline mild cardiomegaly with vascular congestion.   ECHOCARDIOGRAM COMPLETE BUBBLE STUDY 04/25/2020 IMPRESSIONS   1. Left ventricular ejection fraction, by estimation, is 60 to 65%. The left ventricle has normal function. The left ventricle has no regional wall motion abnormalities. There is mild concentric left ventricular hypertrophy. Left ventricular diastolic parameters are consistent with Grade I diastolic dysfunction (impaired relaxation).   2. Right ventricular systolic function is normal. The right ventricular size is normal.   3. The mitral valve is normal in structure. Trivial mitral valve regurgitation.   4. The aortic valve is tricuspid. Aortic valve regurgitation is not visualized.   5. The inferior vena cava is normal in size with greater than 50% respiratory variability,  suggesting right atrial pressure of 3 mmHg.   6. Agitated saline contrast bubble study was negative, with no evidence of any interatrial shunt. Comparison(s): No prior Echocardiogram.  Conclusion(s)/Recommendation(s): No intracardiac source of embolism detected on this transthoracic study. A transesophageal echocardiogram is recommended to exclude cardiac source of embolism if clinically indicated.   ECG - SR rate 74 BPM. (See cardiology reading for complete details  PHYSICAL EXAM Blood pressure (!) 156/91, pulse 74, temperature 97.8 F (36.6 C), temperature source Oral, resp. rate 18, height 5\' 6"  (1.676 m), weight 93.8 kg, SpO2 100 %. Patient is awake and alert, he is oriented, he is a good historian, speech is fluent, no aphasia or dysarthria, able to follow all commands, pupils equally round and reactive to light, visual fields intact, extraocular muscles intact, smile symmetric, facial temperature equal, hearing intact, voice intact, midline tongue extension, strength 5 out of 5 in all extremities, he has dysesthesias in the right hand, plantars downgoing, no ataxia or dysmetria, gait deferred.     ASSESSMENT/PLAN [name] is a 54 y.o. male with history of uncontrolled DM (stopped taking is insulin about a year ago) and smoking who presented to the [facility] ED on Friday with acute onset of right hand paresthesias. He stopped taking his insulin about one year ago  Stroke: Small focus of acute ischemia within the dorsolateral left thalamus likely due to small vessel disease.   Code Stroke CT Head -      CT head - not ordered  MRI head - Small focus of acute ischemia within the dorsolateral left thalamus. No hemorrhage or mass effect. Old right basal ganglia small vessel infarct.   MRA head - not ordered   CTA H&N - No emergent large vessel occlusion or high-grade stenosis of the intracranial arteries.Moderate stenosis of the proximal right common carotid artery  CT Perfusion - not  ordered  Carotid Doppler - CTA neck ordered - carotid dopplers not indicated.  2D Echo - EF 60 - 65%. No cardiac source of emboli identified.   Sars Corona Virus 2 - negative  LDL - 130  HgbA1c - 14.2  UDS - not ordered  VTE prophylaxis - SCDs Diet  Diet Order            Diet heart healthy/carb modified Room service appropriate? Yes; Fluid consistency: Thin  Diet effective now                  No antithrombotic prior to admission, now on aspirin 81 mg daily  Patient counseled to be compliant  with his antithrombotic medications  Ongoing aggressive stroke risk factor management  Therapy recommendations:  pending  Disposition:  Pending  Hypertension  Home BP meds: none   Current BP meds: none   Stable . Permissive hypertension (OK if < 220/120) but gradually normalize in 5-7 days  . Long-term BP goal normotensive  Hyperlipidemia  Home Lipid lowering medication: none   LDL 130, goal < 70  Current lipid lowering medication: Lipitor 40 mg daily   Continue statin at discharge  Diabetes  Home diabetic meds: none   Current diabetic meds: SSI ; insulin  HgbA1c 14.2, goal < 7.0 Recent Labs    04/26/20 0000 04/26/20 0642 04/26/20 1321  GLUCAP 203* 194* 201*     Other Stroke Risk Factors  Cigarette smoker - advised to stop smoking  Obesity, Body mass index is 33.38 kg/m., recommend weight loss, diet and exercise as appropriate   Family hx stroke (mother)   Medical non compliance  Marijuana use - cocaine on drug scree 2012  Previous stroke by imaging  Other Active Problems, Findings and Recommendations  Code status - Full code  Hypokalemia - potassium - 3.3 - supplemented  Aortic atherosclerosis (ICD10-I70.0)  Draining lesion gluteal cleft - Bactrim  Hospital day # 1   Small vessel disease due to uncontrolled vascular risk factors. Stroke will sign off at this time.  Personally examined patient and images, and have participated in  and made any corrections needed to history, physical, neuro exam,assessment and plan as stated above.  I have personally obtained the history, evaluated lab date, reviewed imaging studies and agree with radiology interpretations.    Naomie Dean, MD Stroke Neurology  I spent 35 minutes of face-to-face and non-face-to-face time with patient. This included prechart review, lab review, study review, order entry, electronic health record documentation, patient education on the different diagnostic and therapeutic options, counseling and coordination of care, risks and benefits of management, compliance, or risk factor reduction   Stroke will sign off at this time.   To contact Stroke Continuity provider, please refer to [email or web address]. After hours, contact General Neurology

## 2020-04-26 NOTE — Plan of Care (Signed)
  Problem: Clinical Measurements: Goal: Cardiovascular complication will be avoided Outcome: Progressing   Problem: Clinical Measurements: Goal: Diagnostic test results will improve Outcome: Progressing   Problem: Clinical Measurements: Goal: Will remain free from infection Outcome: Progressing   Problem: Clinical Measurements: Goal: Ability to maintain clinical measurements within normal limits will improve Outcome: Progressing   

## 2020-04-26 NOTE — Progress Notes (Signed)
[facility] Progress Note  Patient: Ryan Hughes    GHW:[id number]  DOA: 04/24/2020     Date of Service: the patient was seen and examined on 04/26/2020  Brief hospital course: Past medical history of HTN, type II DM, HLD, smoking, alcohol use, marijuana use, cocaine use, noncompliance. Presented with right hand paresthesia found to have acute CVA. Currently plan is monitor CBG control and arrange for outpatient medication.  Assessment and Plan: 1. Acute left thalamic stroke Presents with right hand paresthesia. MRI head shows acute ischemia on the dorsolateral left thalamus. No hemorrhage. CTA head and neck shows no significant LVO. Moderate stenosis of proximal right common carotid artery. Echocardiogram shows 60 to 65% EF. No embolic source. LDL 130, on Lipitor Hemoglobin A1c 14.2. On basal bolus regimen of insulin. PT OT recommends no therapy outpatient needed. Tolerating oral diet. No medication prior to admission. Now on 81 mg aspirin.  2. Diabetes mellitus, uncontrolled with hyperglycemia with hyperlipidemia Hemoglobin A1c 14.2. Patient is not on any medication. He stopped taking it 5 years ago. He does not watch his diet. As well as an exercise. Currently will need insulin therapy on discharge. Continuing Lantus basal bolus regimen. Diabetes coordinator consulted. Will require blood glucometer. Will need PCP as well as medical assistance therefore referral to [facility] wellness [facility] for medications. Case management consulted.  3. Active smoker. Smokes half a pack a day. Recommend quit smoking. Continue nicotine patch for now.  4. Alcohol use. History of polysubstance abuse Drinks three beers a day. Currently no evidence of withdrawals. Monitor and recommended to cut down on alcohol intake.  5. Boil with right gluteal cellulitis On Bactrim. Monitor renal function and potassium.  6. Medical compliance Patient not taking medications and not  following up with PCP due to lack of insurance. He has not reach out to social services as well. Currently will attempt to arrange PCP and medical assistance for the patient.  7. Hypokalemia. Replaced. Monitor.  8. Obesity Placing the patient at high risk for poor outcomes. Patient will benefit from dietary consultation as well. Body mass index is 33.38 kg/m.   Diet: Cardiac and carb modified diet DVT Prophylaxis:   SCD's Start: 04/25/20 0235    Advance goals of care discussion: Full code  Family Communication: no family was present at bedside, at the time of interview.   Disposition:  Status is: Inpatient  Remains inpatient appropriate because:Ongoing diagnostic testing needed not appropriate for outpatient work up   Dispo: The patient is from: Home              Anticipated d/c is to: Home              Anticipated d/c date is: 1 day              Patient currently is not medically stable to d/c.        Subjective: No nausea no vomiting but no fever no chills. No chest pain. Continues to have tingling and numbness in the right hand  Physical Exam:  General: Appear in mild distress, no Rash; Oral Mucosa Clear, moist. no Abnormal Neck Mass Or lumps, Conjunctiva normal  Cardiovascular: S1 and S2 Present, no Murmur, Respiratory: good respiratory effort, Bilateral Air entry present and CTA, no Crackles, no wheezes Abdomen: Bowel Sound present, Soft and no tenderness Extremities: no Pedal edema Neurology: alert and oriented to time, place, and person affect appropriate. no new focal deficit Gait not checked due to patient safety  concerns  Vitals:   04/25/20 2356 04/26/20 0342 04/26/20 0846 04/26/20 1254  BP: (!) 179/88 (!) 179/89 (!) 154/97 (!) 156/91  Pulse: 70 65 (!) 56 74  Resp: 18 17 18 18   Temp: 98.4 F (36.9 C) (!) 97.5 F (36.4 C) 98.1 F (36.7 C) 97.8 F (36.6 C)  TempSrc: Oral Oral Oral Oral  SpO2: 99% 98% 96% 100%  Weight:      Height:         Intake/Output Summary (Last 24 hours) at 04/26/2020 1906 Last data filed at 04/26/2020 0640 Gross per 24 hour  Intake 150 ml  Output 640 ml  Net -490 ml   Filed Weights   04/25/20 1426 04/25/20 2047  Weight: 99.8 kg 93.8 kg    Data Reviewed: I have personally reviewed and interpreted daily labs, tele strips, imagings as discussed above. I reviewed all nursing notes, pharmacy notes, vitals, pertinent old records I have discussed plan of care as described above with RN and patient/family.  CBC: Recent Labs  Lab 04/24/20 1700 04/25/20 0417 04/26/20 0120  WBC 6.4 6.5 7.3  NEUTROABS 3.2  --   --   HGB 12.8* 13.9 13.9  HCT 37.9* 40.6 40.9  MCV 93.3 92.3 90.3  PLT 225 238 245   Basic Metabolic Panel: Recent Labs  Lab 04/24/20 1700 04/25/20 0417 04/26/20 0120  NA 135 136 139  K 3.6 3.5 3.3*  CL 98 100 103  CO2 26 26 26   GLUCOSE 389* 358* 262*  BUN 11 10 8   CREATININE 1.01 0.70 0.66  CALCIUM 8.8* 8.8* 9.1    Studies: No results found.  Scheduled Meds: . aspirin EC  81 mg Oral Daily  . atorvastatin  40 mg Oral Daily  . insulin aspart  0-15 Units Subcutaneous TID AC & HS  . insulin aspart  6 Units Subcutaneous TID WC  . insulin glargine  20 Units Subcutaneous QHS  . nicotine  14 mg Transdermal Daily  . sulfamethoxazole-trimethoprim  2 tablet Oral Q12H  . tamsulosin  0.4 mg Oral QPC breakfast   Continuous Infusions: PRN Meds: acetaminophen **OR** acetaminophen (TYLENOL) oral liquid 160 mg/5 mL **OR** acetaminophen  Time spent: 35 minutes  Author: 04/28/20, MD [facility] 04/26/2020 7:06 PM  To reach On-call, see care teams to locate the attending and reach out via www. . Between 7PM-7AM, please contact night-coverage If you still have difficulty reaching the attending provider, please page the [facility] (Director on Call) for [facility] on amion for assistance.

## 2020-04-27 ENCOUNTER — Other Ambulatory Visit: Payer: Self-pay | Admitting: Internal Medicine

## 2020-04-27 LAB — GLUCOSE, CAPILLARY
Glucose-Capillary: 167 mg/dL — ABNORMAL HIGH (ref 70–99)
Glucose-Capillary: 186 mg/dL — ABNORMAL HIGH (ref 70–99)

## 2020-04-27 MED ORDER — LISINOPRIL 20 MG PO TABS: 20.0000 mg | ORAL_TABLET | Freq: Every day | ORAL | 11 refills | Status: DC

## 2020-04-27 MED ORDER — INSULIN GLARGINE 100 UNIT/ML SOLOSTAR PEN: 20.0000 [IU] | PEN_INJECTOR | Freq: Every day | SUBCUTANEOUS | 11 refills | Status: DC

## 2020-04-27 MED ORDER — BLOOD GLUCOSE MONITOR KIT: PACK | 0 refills | Status: DC

## 2020-04-27 MED ORDER — ASPIRIN 81 MG PO TBEC: 81.0000 mg | DELAYED_RELEASE_TABLET | Freq: Every day | ORAL | 11 refills | Status: DC

## 2020-04-27 MED ORDER — GLIPIZIDE 5 MG PO TABS: 5.0000 mg | ORAL_TABLET | Freq: Two times a day (BID) | ORAL | 11 refills | Status: DC

## 2020-04-27 MED ORDER — INSUPEN PEN NEEDLES 31G X 5 MM MISC
1.0000 | Freq: Four times a day (QID) | 11 refills | Status: DC
Start: 2020-04-27 — End: 2020-05-21

## 2020-04-27 MED ORDER — INSULIN ASPART 100 UNIT/ML FLEXPEN: 2.0000 [IU] | PEN_INJECTOR | Freq: Three times a day (TID) | SUBCUTANEOUS | 11 refills | Status: DC

## 2020-04-27 MED ORDER — SULFAMETHOXAZOLE-TRIMETHOPRIM 800-160 MG PO TABS: 2.0000 | ORAL_TABLET | Freq: Two times a day (BID) | ORAL | 0 refills | Status: DC

## 2020-04-27 MED ORDER — INSULIN ASPART 100 UNIT/ML FLEXPEN: 6.0000 [IU] | PEN_INJECTOR | Freq: Three times a day (TID) | SUBCUTANEOUS | 11 refills | Status: DC

## 2020-04-27 MED ORDER — NICOTINE 14 MG/24HR TD PT24: 14.0000 mg | MEDICATED_PATCH | Freq: Every day | TRANSDERMAL | 2 refills | Status: DC

## 2020-04-27 MED ORDER — TAMSULOSIN HCL 0.4 MG PO CAPS: 0.4000 mg | ORAL_CAPSULE | Freq: Every day | ORAL | 3 refills | Status: DC

## 2020-04-27 MED ORDER — ATORVASTATIN CALCIUM 40 MG PO TABS: 40.0000 mg | ORAL_TABLET | Freq: Every day | ORAL | 3 refills | Status: DC

## 2020-04-27 MED FILL — TRUEplus LANCETS 28G MISC: 25 days supply | Qty: 200 | Fill #0

## 2020-04-27 MED FILL — glipiZIDE 5 MG TABS: 5 | 30 days supply | Qty: 60 | Fill #0

## 2020-04-27 MED FILL — ATORVASTATIN CALCIUM 40 MG: 40 | 30 days supply | Qty: 30 | Fill #0

## 2020-04-27 MED FILL — TAMSULOSIN HCL 0.4 MG CAP: 0.4 | 30 days supply | Qty: 30 | Fill #0

## 2020-04-27 MED FILL — NICOTINE 14 MG/24HR PATCH: 14 | 28 days supply | Qty: 28 | Fill #0

## 2020-04-27 MED FILL — !NOVOLOG FLEXPEN SYRINGE 1: 100/ML | 33 days supply | Qty: 15 | Fill #0

## 2020-04-27 MED FILL — !LANTUS SOLOSTAR 100UNITS/M: 100 | 30 days supply | Qty: 6 | Fill #0

## 2020-04-27 MED FILL — TRUEPLUS PEN NDL 31GX3/16: 31G X 5 MM | 25 days supply | Qty: 100 | Fill #0

## 2020-04-27 MED FILL — TRUE METRIX GLUCOSE TEST ST: 25 days supply | Qty: 200 | Fill #0

## 2020-04-27 MED FILL — TRUE METRIX BLOOD GLUCOSE M: W/DEVICE | 30 days supply | Qty: 1 | Fill #0

## 2020-04-27 MED FILL — SULFAMETHOXAZOLE-TMP DS TAB: 800-160 | 3 days supply | Qty: 12 | Fill #0

## 2020-04-27 MED FILL — LISINOPRIL 20 MG TABLET: 20 | 30 days supply | Qty: 30 | Fill #0

## 2020-04-27 NOTE — TOC Transition Note (Signed)
Transition of Care [facility]) - CM/SW Discharge Note   Patient Details  Name: Ryan Hughes MRN: [id number] Date of Birth: 11/03/1965  [facility] CM/SW Contact:  Kermit Balo, RN Phone Number: 04/27/2020, 12:24 PM   Clinical Narrative:    Pt is discharging home with self care. No f/u per PT. No DME needs.  Pt without insurance and PCP. Pt agreeable to being set up with one of the [facility]. Appt made at [facility] and information on the AVS. Medications sent to [facility] pharmacy and pt provided [facility] letter to assist with the cost.  Pt has transport home.   Final next level of care: Home/Self Care Barriers to Discharge: Inadequate or no insurance,Barriers Unresolved (comment)   Patient Goals and CMS Choice        Discharge Placement                       Discharge Plan and Services                                     Social Determinants of Health (SDOH) Interventions     Readmission Risk Interventions No flowsheet data found.

## 2020-04-27 NOTE — Progress Notes (Signed)
Patient discharged to home after 4pm, transportation provided by family

## 2020-04-27 NOTE — Evaluation (Signed)
Speech Language Pathology Evaluation Patient Details Name: Ryan Hughes MRN: [id number] DOB: 1966-02-24 Today's Date: 04/27/2020 Time: 1330-1400 SLP Time Calculation (min) (ACUTE ONLY): 30 min  Problem List:  Patient Active Problem List   Diagnosis Date Noted  . Acute arterial ischemic stroke, vertebrobasilar, thalamic, left (HCC) 04/25/2020  . Uncontrolled type 2 diabetes mellitus with hyperglycemia, without long-term current use of insulin (HCC) 04/25/2020  . Mixed hyperlipidemia due to type 2 diabetes mellitus (HCC) 04/25/2020  . Benign prostatic hyperplasia (BPH) with straining on urination 04/25/2020  . Nicotine dependence, cigarettes, uncomplicated 04/25/2020  . Stroke [facility]) 04/25/2020   Past Medical History:  Past Medical History:  Diagnosis Date  . Diabetes mellitus without complication (HCC)   . Nicotine dependence, cigarettes, uncomplicated 04/25/2020   Past Surgical History: History reviewed. No pertinent surgical history. HPI:  54 year old male with past medical history of medical noncompliance, nicotine dependence, benign prostatic hyperplasia, hyperlipidemia, diabetic polyneuropathy and insulin-dependent diabetes mellitus type 2 who presents to [facility] emergency department via EMS with complaints of right arm tingling MRI brain 04/24/20 indicated Small focus of acute ischemia within the dorsolateral left thalamus. No hemorrhage or mass effect. 2. Old right basal ganglia small vessel infarct; SLE generated.  Assessment / Plan / Recommendation Clinical Impression  Pt administered the SLUMS ([employer] Mental Status Examination) with a score of 22/30 obtained with deficits noted within the areas of memory, attention and auditory comprehension.  Pt stated he "sometimes forgets to turn off the stove" at home and uses marijuana often.  Oriented x4, speech intelligible within complex conversation and verbal expression appear WFL with various naming  tasks/answering questions and carrying on a conversation.  Pt had difficulty repeating numbers backwards, answering specific auditory comprehension tasks (75% with paragraph retention) and completing simple attention tasks such as math calculation which yielded 50% accuracy.  Pt may benefit from OP SLP or [facility] SLP for stated deficits once discharged if in agreement.  ST will f/u while in acute setting for above deficits.  No family available to determine baseline cognitive function (specifically memory).  Thank you for this consult.      SLP Assessment  SLP Recommendation/Assessment: Patient needs continued Speech Language Pathology Services SLP Visit Diagnosis: Attention and concentration deficit Attention and concentration deficit following: Cerebral infarction    Follow Up Recommendations  Home health SLP;Outpatient SLP;Other (comment) (If symptoms persist/unsure of baseline function)    Frequency and Duration min 1 x/week  1 week      SLP Evaluation Cognition  Overall Cognitive Status: No family/caregiver present to determine baseline cognitive functioning Arousal/Alertness: Awake/alert Orientation Level: Oriented X4 Attention: Sustained Sustained Attention: Impaired Sustained Attention Impairment: Verbal basic;Functional basic Memory: Impaired Memory Impairment: Retrieval deficit Immediate Memory Recall: Sock;Blue;Bed Memory Recall Sock: Without Cue Memory Recall Blue: Without Cue Memory Recall Bed: With Cue Awareness: Appears intact Problem Solving: Appears intact Executive Function: Reasoning Reasoning: Impaired Reasoning Impairment: Verbal basic;Functional basic Behaviors: Restless Safety/Judgment: Other (comment) (Difficult to discern)       Comprehension  Auditory Comprehension Overall Auditory Comprehension: Appears within functional limits for tasks assessed Yes/No Questions: Within Functional Limits Commands: Within Functional Limits Conversation: Complex Visual  Recognition/Discrimination Discrimination: Within Function Limits (environmental signs/menu) Reading Comprehension Reading Status: Within funtional limits    Expression Expression Primary Mode of Expression: Verbal Verbal Expression Overall Verbal Expression: Appears within functional limits for tasks assessed Level of Generative/Spontaneous Verbalization: Conversation Repetition: No impairment Naming: No impairment Pragmatics: No impairment Non-Verbal Means of Communication: Not applicable  Written Expression Dominant Hand: Right Written Expression: Within Functional Limits   Oral / Motor  Oral Motor/Sensory Function Overall Oral Motor/Sensory Function: Within functional limits Motor Speech Overall Motor Speech: Appears within functional limits for tasks assessed Respiration: Within functional limits Phonation: Normal Resonance: Within functional limits Articulation: Within functional limitis Intelligibility: Intelligible Motor Speech Errors: Not applicable                       Tressie Stalker, M.S., CCC-SLP 04/27/2020, 2:28 PM

## 2020-04-27 NOTE — TOC CAGE-AID Note (Signed)
Transition of Care [facility]) - CAGE-AID Screening   Patient Details  Name: Ryan Hughes MRN: [id number] Date of Birth: 08-Dec-1965  Transition of Care [facility]) CM/SW Contact:    Kermit Balo, RN Phone Number: 04/27/2020, 12:26 PM   Clinical Narrative: Pt open to answering questions about his alcohol and THC use. Pt accepting of resources for inpatient and outpatient drug/ alcohol counseling.   CAGE-AID Screening:    Have You Ever Felt You Ought to Cut Down on Your Drinking or Drug Use?: Yes Have People Annoyed You By Critizing Your Drinking Or Drug Use?: No Have You Felt Bad Or Guilty About Your Drinking Or Drug Use?: No Have You Ever Had a Drink or Used Drugs First Thing In The Morning to Steady Your Nerves or to Get Rid of a Hangover?: Yes CAGE-AID Score: 2  Substance Abuse Education Offered: Yes  Substance abuse interventions: Patient Counseling,Educational Materials

## 2020-04-27 NOTE — Progress Notes (Signed)
Inpatient Diabetes Program Recommendations  AACE/ADA: New Consensus Statement on Inpatient Glycemic Control (2015)  Target Ranges:  Prepandial:   less than 140 mg/dL      Peak postprandial:   less than 180 mg/dL (1-2 hours)      Critically ill patients:  140 - 180 mg/dL   Lab Results  Component Value Date   GLUCAP 186 (H) 04/27/2020   HGBA1C 14.2 (H) 04/25/2020    Review of Glycemic Control Results for Ryan Hughes, Ryan Hughes (MRN [id number]) as of 04/27/2020 15:52  Ref. Range 04/26/2020 17:35 04/26/2020 22:09 04/27/2020 06:30 04/27/2020 12:02  Glucose-Capillary Latest Ref Range: 70 - 99 mg/dL 154 (H) 008 (H) 676 (H) 186 (H)   Diabetes history: DM 2 Outpatient Diabetes medications: None- patient states he stopped taking insulin about 7 years ago Current orders for Inpatient glycemic control:  Lantus 20 units q HS, Novolog 6 units tid with meals, Novolog moderate tid with meals and HS  Inpatient Diabetes Program Recommendations:    Spoke with patient regarding need for insulin and current A1C.  He has been on insulin in the past and feels comfortable taking it again.  States he used vial/syringe in the past.  Educated patient on insulin pen use at home. Reviewed all steps if insulin pen including attachment of needle, 2-unit air shot, dialing up dose, giving injection, removing needle, disposal of sharps, storage of unused insulin, disposal of insulin etc.  Patient able to provide successful return demonstration.  Also reviewed troubleshooting with insulin pen.  MD to give patient Rxs for insulin pens and insulin pen needles. Patient to f/u at [facility].  MATCH letter given for Rx.'s until patient can get to f/u appointment where he can hopefully get assistance with medication costs.    Discussed signs and symptoms and treatment of hypoglycemia.  Patient was able to verbalize understanding.  Also note that patient is going home on both Novolog meal coverage and Novolog correction.   Reviewed with patient how to determine dose based on blood sugar and meal.  Also instructed him to take Novolog with food.  He has Rx. For meter as well at [facility].    Patient appreciative of information and eager to get home for the holidays.   Thanks,  Beryl Meager, RN, BC-ADM Inpatient Diabetes Coordinator Pager [phone] (8a-5p)

## 2020-04-28 NOTE — Discharge Summary (Signed)
Physician Discharge Summary   Ryan Hughes OMV:[id number] DOB: 1966-03-28 DOA: 04/24/2020  PCP: Patient, No Pcp Per  Admit date: 04/24/2020 Discharge date: 04/28/2020  Admitted From: home Disposition:  home Discharging physician: Dwyane Dee, MD  Recommendations for Outpatient Follow-up:  1. Adjust insulin regimen at follow up 2. Adjust BP regimen at follow up 3.    Patient discharged to home in Discharge Condition: stable CODE STATUS: Full Diet recommendation:  Diet Orders (From admission, onward)    Start     Ordered   04/27/20 0000  Diet - low sodium heart healthy        04/27/20 1152   04/27/20 0000  Diet Carb Modified        04/27/20 1152          Hospital Course:  Acute left thalamic stroke Presented with right hand paresthesia. MRI head shows acute ischemia on the dorsolateral left thalamus. CTA head and neck shows no significant LVO. Moderate stenosis of proximal right common carotid artery. Echocardiogram shows 60 to 65% EF. No embolic source. LDL 130, on Lipitor Hemoglobin A1c 14.2. PT OT recommends no therapy outpatient needed. - continue asa at discharge - outpatient follow up with neurology  Diabetes mellitus, uncontrolled with hyperglycemia with hyperlipidemia Hemoglobin A1c 14.2. - patient counseled on compliance going forward, he understands - starting Lantus, Humalog SSI and glipizide at discharge - glucometer kit ordered at discharge - scripts sent to [facility], needs to establish care as well_0  Active smoker. Smokes half a pack a day. Recommend quit smoking. Continue nicotine patch for now.  Alcohol use. History of polysubstance abuse Drinks three beers a day. Currently no evidence of withdrawals.  Boil with right gluteal cellulitis On Bactrim. Responded well to abx; discharged with short course to complete  Medical compliance - patient to establish with community health and  wellness  Hypokalemia. Replaced.  Obesity Placing the patient at high risk for poor outcomes. Patient will benefit from dietary consultation as well. Body mass index is 33.38 kg/m.     The patient's chronic medical conditions were treated accordingly per the patient's home medication regimen except as noted.  On day of discharge, patient was felt deemed stable for discharge. Patient/family member advised to call PCP or come back to ER if needed.   Principal Diagnosis: Acute arterial ischemic stroke, vertebrobasilar, thalamic, left [facility])  Discharge Diagnoses: Active Hospital Problems   Diagnosis Date Noted  . Acute arterial ischemic stroke, vertebrobasilar, thalamic, left ([place]) 04/25/2020  . Uncontrolled type 2 diabetes mellitus with hyperglycemia, without long-term current use of insulin ([place]) 04/25/2020  . Mixed hyperlipidemia due to type 2 diabetes mellitus ([place]) 04/25/2020  . Benign prostatic hyperplasia (BPH) with straining on urination 04/25/2020  . Nicotine dependence, cigarettes, uncomplicated [id number]/83/6629  . Stroke [facility]) 04/25/2020    Resolved Hospital Problems  No resolved problems to display.    Discharge Instructions    Diet - low sodium heart healthy   Complete by: As directed    Diet Carb Modified   Complete by: As directed    Increase activity slowly   Complete by: As directed    No wound care   Complete by: As directed      Allergies as of 04/27/2020   No Known Allergies     Medication List    STOP taking these medications   docusate sodium 100 MG capsule Commonly known as: COLACE   metFORMIN 1000 MG tablet Commonly known as: GLUCOPHAGE   naproxen 375 MG tablet  Commonly known as: NAPROSYN     TAKE these medications   aspirin 81 MG EC tablet Take 1 tablet (81 mg total) by mouth daily. Swallow whole.   atorvastatin 40 MG tablet Commonly known as: LIPITOR Take 1 tablet (40 mg total) by mouth daily.   blood glucose meter kit and supplies  Kit Dispense based on patient and insurance preference. Use up to four times daily as directed. (FOR ICD-9 250.00, 250.01).   glipiZIDE 5 MG tablet Commonly known as: GLUCOTROL Take 1 tablet (5 mg total) by mouth 2 (two) times daily before a meal.   insulin aspart 100 UNIT/ML FlexPen Commonly known as: NOVOLOG Inject 2-15 Units into the skin 4 (four) times daily -  before meals and at bedtime. Glucose 121 - 150: 2 units, Glucose 151 - 200: 3 units, Glucose 201 - 250: 5 units, Glucose 251 - 300: 8 units, Glucose 301 - 350: 11 units, Glucose 351 - 400: 15 units, Glucose > 400 then call MD   insulin aspart 100 UNIT/ML FlexPen Commonly known as: NOVOLOG Inject 6 Units into the skin 3 (three) times daily with meals.   insulin glargine 100 UNIT/ML Solostar Pen Commonly known as: LANTUS Inject 20 Units into the skin daily.   Insupen Pen Needles 31G X 5 MM Misc Generic drug: Insulin Pen Needle 1 each by Does not apply route in the morning, at noon, in the evening, and at bedtime.   lisinopril 20 MG tablet Commonly known as: ZESTRIL Take 1 tablet (20 mg total) by mouth daily.   nicotine 14 mg/24hr patch Commonly known as: NICODERM CQ - dosed in mg/24 hours Place 1 patch (14 mg total) onto the skin daily.   sulfamethoxazole-trimethoprim 800-160 MG tablet Commonly known as: BACTRIM DS Take 2 tablets by mouth every 12 (twelve) hours for 3 days.   tamsulosin 0.4 MG Caps capsule Commonly known as: FLOMAX Take 1 capsule (0.4 mg total) by mouth daily after breakfast.       Follow-up Information    [place] Follow up.   Why: Please use this location for your pharmacy needs. Contact information: [place] [address] [phone]       [facility] Follow up on 05/21/2020.   Specialty: Family Medicine Why: Your appointment is at 1:30 pm. Please arrive early and bring a picture ID and your  current medications. Contact information: [place] [address] [phone]             No Known Allergies  Consultations: Neuro  Discharge Exam: BP (!) 145/78 (BP Location: Left Arm)   Pulse 67   Temp 98.3 F (36.8 C) (Oral)   Resp 18   Ht 5' 6" (1.676 m)   Wt 93.8 kg   SpO2 99%   BMI 33.38 kg/m  General appearance: alert, cooperative and no distress Head: Normocephalic, without obvious abnormality, atraumatic Eyes: EOMI Lungs: clear to auscultation bilaterally Heart: regular rate and rhythm and S1, S2 normal Abdomen: normal findings: bowel sounds normal and soft, non-tender Extremities: no edema Skin: mobility and turgor normal Neurologic: strength intact throughout; paresthesia still noted in RUE  The results of significant diagnostics from this hospitalization (including imaging, microbiology, ancillary and laboratory) are listed below for reference.   Microbiology: Recent Results (from the past 240 hour(s))  Resp Panel by RT-PCR (Flu A&B, Covid) Nasopharyngeal Swab     Status: None   Collection Time:  04/24/20  5:50 PM   Specimen: Nasopharyngeal Swab; Nasopharyngeal(NP) swabs in vial transport medium  Result Value Ref Range Status   SARS Coronavirus 2 by RT PCR NEGATIVE NEGATIVE Final    Comment: (NOTE) SARS-CoV-2 target nucleic acids are NOT DETECTED.  The SARS-CoV-2 RNA is generally detectable in upper respiratory specimens during the acute phase of infection. The lowest concentration of SARS-CoV-2 viral copies this assay can detect is 138 copies/mL. A negative result does not preclude SARS-Cov-2 infection and should not be used as the sole basis for treatment or other patient management decisions. A negative result may occur with  improper specimen collection/handling, submission of specimen other than nasopharyngeal swab, presence of viral mutation(s) within the areas targeted by this assay, and inadequate number of  viral copies(<138 copies/mL). A negative result must be combined with clinical observations, patient history, and epidemiological information. The expected result is Negative.  Fact Sheet for Patients:  EntrepreneurPulse.com.au  Fact Sheet for Healthcare Providers:  IncredibleEmployment.be  This test is no t yet approved or cleared by the [place] FDA and  has been authorized for detection and/or diagnosis of SARS-CoV-2 by FDA under an Emergency Use Authorization (EUA). This EUA will remain  in effect (meaning this test can be used) for the duration of the COVID-19 declaration under Section 564(b)(1) of the Act, 21 U.S.C.section 360bbb-3(b)(1), unless the authorization is terminated  or revoked sooner.       Influenza A by PCR NEGATIVE NEGATIVE Final   Influenza B by PCR NEGATIVE NEGATIVE Final    Comment: (NOTE) The Xpert Xpress SARS-CoV-2/FLU/RSV plus assay is intended as an aid in the diagnosis of influenza from Nasopharyngeal swab specimens and should not be used as a sole basis for treatment. Nasal washings and aspirates are unacceptable for Xpert Xpress SARS-CoV-2/FLU/RSV testing.  Fact Sheet for Patients: EntrepreneurPulse.com.au  Fact Sheet for Healthcare Providers: IncredibleEmployment.be  This test is not yet approved or cleared by the [place] FDA and has been authorized for detection and/or diagnosis of SARS-CoV-2 by FDA under an Emergency Use Authorization (EUA). This EUA will remain in effect (meaning this test can be used) for the duration of the COVID-19 declaration under Section 564(b)(1) of the Act, 21 U.S.C. section 360bbb-3(b)(1), unless the authorization is terminated or revoked.  Performed at [facility], [place] [address], [place], [place] [address]      Labs: BNP (last 3 results) No results for input(s): BNP in the last 8760 hours. Basic  Metabolic Panel: Recent Labs  Lab 04/24/20 1700 04/25/20 0417 04/26/20 0120  NA 135 136 139  K 3.6 3.5 3.3*  CL 98 100 103  CO2 _0 GLUCOSE 389* 358* 262*  BUN _1 CREATININE 1.01 0.70 0.66  CALCIUM 8.8* 8.8* 9.1   Liver Function Tests: Recent Labs  Lab 04/24/20 1700 04/25/20 0417 04/26/20 0120  AST 16 11* 11*  ALT _2 ALKPHOS 73 73 68  BILITOT 0.4 0.6 0.7  PROT 7.0 6.4* 6.1*  ALBUMIN 3.8 3.6 3.2*   No results for input(s): LIPASE, AMYLASE in the last 168 hours. No results for input(s): AMMONIA in the last 168 hours. CBC: Recent Labs  Lab 04/24/20 1700 04/25/20 0417 04/26/20 0120  WBC 6.4 6.5 7.3  NEUTROABS 3.2  --   --   HGB 12.8* 13.9 13.9  HCT 37.9* 40.6 40.9  MCV 93.3 92.3 90.3  PLT 225 238 245   Cardiac Enzymes: No results for input(s):  CKTOTAL, CKMB, CKMBINDEX, TROPONINI in the last 168 hours. BNP: Invalid input(s): POCBNP CBG: Recent Labs  Lab 04/26/20 1321 04/26/20 1735 04/26/20 2209 04/27/20 0630 04/27/20 1202  GLUCAP 201* 141* 235* 167* 186*   D-Dimer No results for input(s): DDIMER in the last 72 hours. Hgb A1c No results for input(s): HGBA1C in the last 72 hours. Lipid Profile No results for input(s): CHOL, HDL, LDLCALC, TRIG, CHOLHDL, LDLDIRECT in the last 72 hours. Thyroid function studies No results for input(s): TSH, T4TOTAL, T3FREE, THYROIDAB in the last 72 hours.  Invalid input(s): FREET3 Anemia work up No results for input(s): VITAMINB12, FOLATE, FERRITIN, TIBC, IRON, RETICCTPCT in the last 72 hours. Urinalysis    Component Value Date/Time   COLORURINE YELLOW 04/24/2020 2001   APPEARANCEUR CLEAR 04/24/2020 2001   LABSPEC 1.030 04/24/2020 2001   PHURINE 5.0 04/24/2020 2001   GLUCOSEU >=500 (A) 04/24/2020 2001   HGBUR NEGATIVE 04/24/2020 2001   [place] NEGATIVE 04/24/2020 2001   KETONESUR NEGATIVE 04/24/2020 2001   PROTEINUR NEGATIVE 04/24/2020 2001   UROBILINOGEN 0.2 07/04/2010 0959   NITRITE  NEGATIVE 04/24/2020 2001   LEUKOCYTESUR NEGATIVE 04/24/2020 2001   Sepsis Labs Invalid input(s): PROCALCITONIN,  WBC,  LACTICIDVEN Microbiology Recent Results (from the past 240 hour(s))  Resp Panel by RT-PCR (Flu A&B, Covid) Nasopharyngeal Swab     Status: None   Collection Time: 04/24/20  5:50 PM   Specimen: Nasopharyngeal Swab; Nasopharyngeal(NP) swabs in vial transport medium  Result Value Ref Range Status   SARS Coronavirus 2 by RT PCR NEGATIVE NEGATIVE Final    Comment: (NOTE) SARS-CoV-2 target nucleic acids are NOT DETECTED.  The SARS-CoV-2 RNA is generally detectable in upper respiratory specimens during the acute phase of infection. The lowest concentration of SARS-CoV-2 viral copies this assay can detect is 138 copies/mL. A negative result does not preclude SARS-Cov-2 infection and should not be used as the sole basis for treatment or other patient management decisions. A negative result may occur with  improper specimen collection/handling, submission of specimen other than nasopharyngeal swab, presence of viral mutation(s) within the areas targeted by this assay, and inadequate number of viral copies(<138 copies/mL). A negative result must be combined with clinical observations, patient history, and epidemiological information. The expected result is Negative.  Fact Sheet for Patients:  EntrepreneurPulse.com.au  Fact Sheet for Healthcare Providers:  IncredibleEmployment.be  This test is no t yet approved or cleared by the [place] FDA and  has been authorized for detection and/or diagnosis of SARS-CoV-2 by FDA under an Emergency Use Authorization (EUA). This EUA will remain  in effect (meaning this test can be used) for the duration of the COVID-19 declaration under Section 564(b)(1) of the Act, 21 U.S.C.section 360bbb-3(b)(1), unless the authorization is terminated  or revoked sooner.       Influenza A by PCR NEGATIVE  NEGATIVE Final   Influenza B by PCR NEGATIVE NEGATIVE Final    Comment: (NOTE) The Xpert Xpress SARS-CoV-2/FLU/RSV plus assay is intended as an aid in the diagnosis of influenza from Nasopharyngeal swab specimens and should not be used as a sole basis for treatment. Nasal washings and aspirates are unacceptable for Xpert Xpress SARS-CoV-2/FLU/RSV testing.  Fact Sheet for Patients: EntrepreneurPulse.com.au  Fact Sheet for Healthcare Providers: IncredibleEmployment.be  This test is not yet approved or cleared by the [place] FDA and has been authorized for detection and/or diagnosis of SARS-CoV-2 by FDA under an Emergency Use Authorization (EUA). This EUA will remain in effect (meaning this test can  be used) for the duration of the COVID-19 declaration under Section 564(b)(1) of the Act, 21 U.S.C. section 360bbb-3(b)(1), unless the authorization is terminated or revoked.  Performed at [facility], [place] [address], [place], [place] [address]     Procedures/Studies: CT ANGIO HEAD W OR WO CONTRAST  Result Date: 04/25/2020 CLINICAL DATA:  Right hand tingling EXAM: CT ANGIOGRAPHY HEAD AND NECK TECHNIQUE: Multidetector CT imaging of the head and neck was performed using the standard protocol during bolus administration of intravenous contrast. Multiplanar CT image reconstructions and MIPs were obtained to evaluate the vascular anatomy. Carotid stenosis measurements (when applicable) are obtained utilizing NASCET criteria, using the distal internal carotid diameter as the denominator. CONTRAST:  154m OMNIPAQUE IOHEXOL 350 MG/ML SOLN COMPARISON:  None. FINDINGS: CT HEAD FINDINGS Brain: There is no mass, hemorrhage or extra-axial collection. The size and configuration of the ventricles and extra-axial CSF spaces are normal. There is no acute or chronic infarction. The brain parenchyma is normal. Skull: The visualized skull base,  calvarium and extracranial soft tissues are normal. Sinuses/Orbits: No fluid levels or advanced mucosal thickening of the visualized paranasal sinuses. No mastoid or middle ear effusion. The orbits are normal. CTA NECK FINDINGS SKELETON: There is no bony spinal canal stenosis. No lytic or blastic lesion. OTHER NECK: Normal pharynx, larynx and major salivary glands. No cervical lymphadenopathy. Unremarkable thyroid gland. UPPER CHEST: No pneumothorax or pleural effusion. No nodules or masses. AORTIC ARCH: There is calcific atherosclerosis of the aortic arch. There is no aneurysm, dissection or hemodynamically significant stenosis of the visualized portion of the aorta. Conventional 3 vessel aortic branching pattern. The visualized proximal subclavian arteries are widely patent. RIGHT CAROTID SYSTEM: There is moderate stenosis of the proximal right common carotid artery. No hemodynamically significant stenosis of the internal carotid artery by NASCET criteria. LEFT CAROTID SYSTEM: No dissection, occlusion or aneurysm. Mild atherosclerotic calcification at the carotid bifurcation without hemodynamically significant stenosis. VERTEBRAL ARTERIES: Codominant configuration. Both origins are clearly patent. There is no dissection, occlusion or flow-limiting stenosis to the skull base (V1-V3 segments). CTA HEAD FINDINGS POSTERIOR CIRCULATION: --Vertebral arteries: Normal V4 segments. --Inferior cerebellar arteries: Normal. --Basilar artery: Normal. --Superior cerebellar arteries: Normal. --Posterior cerebral arteries (PCA): Normal. ANTERIOR CIRCULATION: --Intracranial internal carotid arteries: Normal. --Anterior cerebral arteries (ACA): Normal. Both A1 segments are present. Patent anterior communicating artery (a-comm). --Middle cerebral arteries (MCA): Normal. VENOUS SINUSES: As permitted by contrast timing, patent. ANATOMIC VARIANTS: None Review of the MIP images confirms the above findings. IMPRESSION: 1. No emergent  large vessel occlusion or high-grade stenosis of the intracranial arteries. 2. Moderate stenosis of the proximal right common carotid artery. 3. Mild left carotid bifurcation atherosclerosis without hemodynamically significant stenosis. Aortic atherosclerosis (ICD10-I70.0). Electronically Signed   By: KUlyses JarredM.D.   On: 04/25/2020 03:42   CT ANGIO NECK W OR WO CONTRAST  Result Date: 04/25/2020 CLINICAL DATA:  Right hand tingling EXAM: CT ANGIOGRAPHY HEAD AND NECK TECHNIQUE: Multidetector CT imaging of the head and neck was performed using the standard protocol during bolus administration of intravenous contrast. Multiplanar CT image reconstructions and MIPs were obtained to evaluate the vascular anatomy. Carotid stenosis measurements (when applicable) are obtained utilizing NASCET criteria, using the distal internal carotid diameter as the denominator. CONTRAST:  1044mOMNIPAQUE IOHEXOL 350 MG/ML SOLN COMPARISON:  None. FINDINGS: CT HEAD FINDINGS Brain: There is no mass, hemorrhage or extra-axial collection. The size and configuration of the ventricles and extra-axial CSF spaces are normal. There is no acute  or chronic infarction. The brain parenchyma is normal. Skull: The visualized skull base, calvarium and extracranial soft tissues are normal. Sinuses/Orbits: No fluid levels or advanced mucosal thickening of the visualized paranasal sinuses. No mastoid or middle ear effusion. The orbits are normal. CTA NECK FINDINGS SKELETON: There is no bony spinal canal stenosis. No lytic or blastic lesion. OTHER NECK: Normal pharynx, larynx and major salivary glands. No cervical lymphadenopathy. Unremarkable thyroid gland. UPPER CHEST: No pneumothorax or pleural effusion. No nodules or masses. AORTIC ARCH: There is calcific atherosclerosis of the aortic arch. There is no aneurysm, dissection or hemodynamically significant stenosis of the visualized portion of the aorta. Conventional 3 vessel aortic branching  pattern. The visualized proximal subclavian arteries are widely patent. RIGHT CAROTID SYSTEM: There is moderate stenosis of the proximal right common carotid artery. No hemodynamically significant stenosis of the internal carotid artery by NASCET criteria. LEFT CAROTID SYSTEM: No dissection, occlusion or aneurysm. Mild atherosclerotic calcification at the carotid bifurcation without hemodynamically significant stenosis. VERTEBRAL ARTERIES: Codominant configuration. Both origins are clearly patent. There is no dissection, occlusion or flow-limiting stenosis to the skull base (V1-V3 segments). CTA HEAD FINDINGS POSTERIOR CIRCULATION: --Vertebral arteries: Normal V4 segments. --Inferior cerebellar arteries: Normal. --Basilar artery: Normal. --Superior cerebellar arteries: Normal. --Posterior cerebral arteries (PCA): Normal. ANTERIOR CIRCULATION: --Intracranial internal carotid arteries: Normal. --Anterior cerebral arteries (ACA): Normal. Both A1 segments are present. Patent anterior communicating artery (a-comm). --Middle cerebral arteries (MCA): Normal. VENOUS SINUSES: As permitted by contrast timing, patent. ANATOMIC VARIANTS: None Review of the MIP images confirms the above findings. IMPRESSION: 1. No emergent large vessel occlusion or high-grade stenosis of the intracranial arteries. 2. Moderate stenosis of the proximal right common carotid artery. 3. Mild left carotid bifurcation atherosclerosis without hemodynamically significant stenosis. Aortic atherosclerosis (ICD10-I70.0). Electronically Signed   By: Ulyses Jarred M.D.   On: 04/25/2020 03:42   MR BRAIN WO CONTRAST  Result Date: 04/24/2020 CLINICAL DATA:  Right hand tingling EXAM: MRI HEAD WITHOUT CONTRAST TECHNIQUE: Multiplanar, multiecho pulse sequences of the brain and surrounding structures were obtained without intravenous contrast. COMPARISON:  None. FINDINGS: Brain: There is a small focus of abnormal diffusion restriction within the dorsolateral  left thalamus. No acute or chronic hemorrhage. There is multifocal hyperintense T2-weighted signal within the white matter. Parenchymal volume and CSF spaces are normal. There is an old right basal ganglia small vessel infarct. Vascular: Major flow voids are preserved. Skull and upper cervical spine: Normal calvarium and skull base. Visualized upper cervical spine and soft tissues are normal. Sinuses/Orbits:No paranasal sinus fluid levels or advanced mucosal thickening. Small amount of left mastoid fluid. Normal orbits. IMPRESSION: 1. Small focus of acute ischemia within the dorsolateral left thalamus. No hemorrhage or mass effect. 2. Old right basal ganglia small vessel infarct. Electronically Signed   By: Ulyses Jarred M.D.   On: 04/24/2020 23:53   DG Chest Port 1 View  Result Date: 04/24/2020 CLINICAL DATA:  Weakness. EXAM: PORTABLE CHEST 1 VIEW COMPARISON:  None. FINDINGS: Borderline mild cardiomegaly. Normal mediastinal contours. Vascular congestion without evidence of pulmonary edema. No focal airspace disease. No pleural fluid or pneumothorax. No acute osseous abnormalities are seen. IMPRESSION: Borderline mild cardiomegaly with vascular congestion. Electronically Signed   By: Keith Rake M.D.   On: 04/24/2020 17:39   ECHOCARDIOGRAM COMPLETE BUBBLE STUDY  Result Date: 04/25/2020    ECHOCARDIOGRAM REPORT   Patient Name:   KALI Dyar Date of Exam: 04/25/2020 Medical Rec #:  [id number]     Height:  66.0 in Accession #:    [id number]    Weight:       220.0 lb Date of Birth:  1966-03-13     BSA:          2.082 m Patient Age:    54 years      BP:           158/83 mmHg Patient Gender: M             HR:           63 bpm. Exam Location:  Inpatient Procedure: 2D Echo, Color Doppler, Cardiac Doppler and Saline Contrast Bubble            Study Indications:    Stroke i163.9  History:        Patient has no prior history of Echocardiogram examinations.                 Risk Factors:Diabetes and  Dyslipidemia.  Sonographer:    Raquel Sarna Senior RDCS Referring Phys: [id number] [place]  1. Left ventricular ejection fraction, by estimation, is 60 to 65%. The left ventricle has normal function. The left ventricle has no regional wall motion abnormalities. There is mild concentric left ventricular hypertrophy. Left ventricular diastolic parameters are consistent with Grade I diastolic dysfunction (impaired relaxation).  2. Right ventricular systolic function is normal. The right ventricular size is normal.  3. The mitral valve is normal in structure. Trivial mitral valve regurgitation.  4. The aortic valve is tricuspid. Aortic valve regurgitation is not visualized.  5. The inferior vena cava is normal in size with greater than 50% respiratory variability, suggesting right atrial pressure of 3 mmHg.  6. Agitated saline contrast bubble study was negative, with no evidence of any interatrial shunt. Comparison(s): No prior Echocardiogram. Conclusion(s)/Recommendation(s): No intracardiac source of embolism detected on this transthoracic study. A transesophageal echocardiogram is recommended to exclude cardiac source of embolism if clinically indicated. FINDINGS  Left Ventricle: Left ventricular ejection fraction, by estimation, is 60 to 65%. The left ventricle has normal function. The left ventricle has no regional wall motion abnormalities. The left ventricular internal cavity size was normal in size. There is  mild concentric left ventricular hypertrophy. Left ventricular diastolic parameters are consistent with Grade I diastolic dysfunction (impaired relaxation). Right Ventricle: The right ventricular size is normal. No increase in right ventricular wall thickness. Right ventricular systolic function is normal. Left Atrium: Left atrial size was normal in size. Right Atrium: Right atrial size was normal in size. Pericardium: There is no evidence of pericardial effusion. Mitral Valve: The mitral valve  is normal in structure. Trivial mitral valve regurgitation. Tricuspid Valve: The tricuspid valve is normal in structure. Tricuspid valve regurgitation is trivial. Aortic Valve: The aortic valve is tricuspid. Aortic valve regurgitation is not visualized. Pulmonic Valve: The pulmonic valve was normal in structure. Pulmonic valve regurgitation is trivial. Aorta: The aortic root and ascending aorta are structurally normal, with no evidence of dilitation. Venous: The inferior vena cava is normal in size with greater than 50% respiratory variability, suggesting right atrial pressure of 3 mmHg. IAS/Shunts: No atrial level shunt detected by color flow Doppler. Agitated saline contrast was given intravenously to evaluate for intracardiac shunting. Agitated saline contrast bubble study was negative, with no evidence of any interatrial shunt.  LEFT VENTRICLE PLAX 2D LVIDd:         5.30 cm  Diastology LVIDs:         3.50 cm  LV  e' medial:    6.09 cm/s LV PW:         1.00 cm  LV E/e' medial:  10.8 LV IVS:        1.00 cm  LV e' lateral:   5.11 cm/s LVOT diam:     2.00 cm  LV E/e' lateral: 12.9 LV SV:         63 LV SV Index:   30 LVOT Area:     3.14 cm  RIGHT VENTRICLE RV S prime:     13.40 cm/s TAPSE (M-mode): 2.5 cm LEFT ATRIUM             Index       RIGHT ATRIUM           Index LA diam:        3.90 cm 1.87 cm/m  RA Area:     16.90 cm LA Vol (A2C):   41.6 ml 19.98 ml/m RA Volume:   45.60 ml  21.90 ml/m LA Vol (A4C):   45.1 ml 21.66 ml/m LA Biplane Vol: 44.5 ml 21.37 ml/m  AORTIC VALVE LVOT Vmax:   79.10 cm/s LVOT Vmean:  58.600 cm/s LVOT VTI:    0.200 m  AORTA Ao Root diam: 2.80 cm Ao Asc diam:  2.90 cm MITRAL VALVE MV Area (PHT): 3.60 cm    SHUNTS MV Decel Time: 211 msec    Systemic VTI:  0.20 m MV E velocity: 66.00 cm/s  Systemic Diam: 2.00 cm MV A velocity: 70.30 cm/s MV E/A ratio:  0.94 Gwyndolyn Kaufman MD Electronically signed by Gwyndolyn Kaufman MD Signature Date/Time: 04/25/2020/2:13:50 PM    Final       Time coordinating discharge: Over 30 minutes    Dwyane Dee, MD  [facility] 04/28/2020, 10:28 AM

## 2020-04-29 ENCOUNTER — Telehealth: Payer: Self-pay | Admitting: General Practice

## 2020-04-29 NOTE — Telephone Encounter (Signed)
I return Pt call, LVM inform him that he need to see the PCP on 05/21/20 then he can schedule a financial appt until them unfortunate we can't help him, that is the rules and regulation of the clinic for financial

## 2020-04-29 NOTE — Telephone Encounter (Signed)
Copied from CRM [id number]. Topic: Appointment Scheduling - Scheduling Inquiry for Clinic >> Apr 29, 2020  2:08 PM Crist Infante wrote: Reason for CRM: pt would like a call back to discuss getting the orange card.  Please call neice

## 2020-05-06 ENCOUNTER — Other Ambulatory Visit: Payer: Self-pay | Admitting: *Deleted

## 2020-05-06 NOTE — Patient Outreach (Signed)
[facility]) Care Management  05/06/2020  Ryan Hughes [birth year] [id number]   [facility] outreach for EMMI- stroke  RED ON EMMI ALERT Day #   1        Date:  Wednesday 04/29/20 1000  Red Alert Reason: Scheduled a follow-up appointment? No   Insurance: none [facility] admissions x  1 ED visits x 1 in the last 6 months    Outreach attempt #1 to [phone] Patient is able to verify HIPAA identifiers [facility] Care Management RN reviewed and addressed red alert with patient  Consent: [facility] RN CM reviewed [facility] services with patient. Patient gave verbal consent for services [facility] telephonic RN CM.  He places [facility] RN CM on speaker phone with his significant other Ryan Hughes to assist in answering questions  EMMI:  Ryan Hughes and Ryan Hughes confirms the EMMI answer was correct as the [facility] appointment to see Ryan Duster NP was scheduled prior to his hospital discharge for May 21 2020 at 1330  They answered no because they already had a scheduled appointment and did not need to schedule and appointment   Issue resolved   Social: Ryan Orbin Mayeux lives in his apartment with his significant other Ryan Hughes  He is able to care for himself  He is assisted and supported by Ryan Hughes   Past Medical History:  Diagnosis Date  . Diabetes mellitus without complication (HCC)   . Nicotine dependence, cigarettes, uncomplicated 04/25/2020   DME true glucometer     Plan: [facility] RN CM will close this case as Ryan Pipkins is not eligible for further [facility] services, Issue has been resolved and he has external  Publishing rights manager (CM) services at the [facility] He and Ryan Hughes were encouraged to outreach prn, provided [facility] RN CM and 24 hour nurse numbers until seen on May 21 2020 They voiced understanding and appreciation  Successful letter sent   Cala Bradford L. Noelle Penner, RN, BSN, CCM [facility] Telephonic Care Management Care Coordinator Office number [phone] Mobile number [phone]  Main [facility] number [phone] Fax number [phone]

## 2020-05-07 ENCOUNTER — Encounter: Payer: Self-pay | Admitting: *Deleted

## 2020-05-21 ENCOUNTER — Telehealth (INDEPENDENT_AMBULATORY_CARE_PROVIDER_SITE_OTHER): Payer: Self-pay | Admitting: Primary Care

## 2020-05-21 ENCOUNTER — Other Ambulatory Visit (INDEPENDENT_AMBULATORY_CARE_PROVIDER_SITE_OTHER): Payer: Self-pay | Admitting: Primary Care

## 2020-05-21 ENCOUNTER — Other Ambulatory Visit: Payer: Self-pay

## 2020-05-21 DIAGNOSIS — Z09 Encounter for follow-up examination after completed treatment for conditions other than malignant neoplasm: Secondary | ICD-10-CM

## 2020-05-21 DIAGNOSIS — N401 Enlarged prostate with lower urinary tract symptoms: Secondary | ICD-10-CM

## 2020-05-21 DIAGNOSIS — R3916 Straining to void: Secondary | ICD-10-CM

## 2020-05-21 DIAGNOSIS — E1165 Type 2 diabetes mellitus with hyperglycemia: Secondary | ICD-10-CM

## 2020-05-21 DIAGNOSIS — Z7689 Persons encountering health services in other specified circumstances: Secondary | ICD-10-CM

## 2020-05-21 MED ORDER — TAMSULOSIN HCL 0.4 MG PO CAPS: 0.4000 mg | ORAL_CAPSULE | Freq: Every day | ORAL | 3 refills | Status: DC

## 2020-05-21 MED ORDER — LISINOPRIL 20 MG PO TABS: 20.0000 mg | ORAL_TABLET | Freq: Every day | ORAL | 11 refills | Status: DC

## 2020-05-21 MED ORDER — METFORMIN HCL 1000 MG PO TABS: 1000.0000 mg | ORAL_TABLET | Freq: Two times a day (BID) | ORAL | 3 refills | Status: DC

## 2020-05-21 MED ORDER — INSUPEN PEN NEEDLES 31G X 5 MM MISC
1.0000 | Freq: Four times a day (QID) | 11 refills | Status: DC
Start: 2020-05-21 — End: 2020-05-21

## 2020-05-21 MED ORDER — INSULIN ASPART 100 UNIT/ML FLEXPEN: 2.0000 [IU] | PEN_INJECTOR | Freq: Three times a day (TID) | SUBCUTANEOUS | 11 refills | Status: DC

## 2020-05-21 MED ORDER — ATORVASTATIN CALCIUM 40 MG PO TABS: 40.0000 mg | ORAL_TABLET | Freq: Every day | ORAL | 3 refills | Status: DC

## 2020-05-21 MED ORDER — GLIPIZIDE 10 MG PO TABS: 10.0000 mg | ORAL_TABLET | Freq: Two times a day (BID) | ORAL | 1 refills | Status: DC

## 2020-05-21 MED ORDER — INSULIN GLARGINE 100 UNIT/ML SOLOSTAR PEN
20.0000 [IU] | PEN_INJECTOR | Freq: Every day | SUBCUTANEOUS | 11 refills | Status: DC
Start: 2020-05-21 — End: 2021-03-28

## 2020-05-21 MED FILL — glipiZIDE 10 MG TABS: 10 | 30 days supply | Qty: 60 | Fill #0

## 2020-05-21 MED FILL — TRUEPLUS PEN NDL 31GX3/16: 31G X 5 MM | 20 days supply | Qty: 100 | Fill #0

## 2020-05-21 MED FILL — TAMSULOSIN HCL 0.4 MG CAP: 0.4 | 30 days supply | Qty: 30 | Fill #0

## 2020-05-21 MED FILL — ATORVASTATIN CALCIUM 40 MG: 40 | 30 days supply | Qty: 30 | Fill #0

## 2020-05-21 MED FILL — LISINOPRIL 20 MG TABLET: 20 | 30 days supply | Qty: 30 | Fill #0

## 2020-05-21 MED FILL — METFORMIN HCL 1000 MG TABS: 1000 | 30 days supply | Qty: 60 | Fill #0

## 2020-05-21 NOTE — Progress Notes (Signed)
I connected with  Ryan Hughes on 05/21/20 by a video enabled telemedicine application and verified that I am speaking with the correct person using two identifiers.   I discussed the limitations of evaluation and management by telemedicine. The patient expressed understanding and agreed to proceed.   Pt is able to check CBG at home  He did eat  CBG before eating this am was 89 Pt gives permission to speak with spouse Rosey Bath

## 2020-05-21 NOTE — Progress Notes (Signed)
Telephone Note  I connected with Ryan Hughes on 05/31/20 at  1:30 PM EST by telephone and verified that I am speaking with the correct person using two identifiers.  Location: Patient: home  Provider: [name] working from home   I discussed the limitations, risks, security and privacy concerns of performing an evaluation and management service by telephone and the availability of in person appointments. I also discussed with the patient that there may be a patient responsible charge related to this service. The patient expressed understanding and agreed to proceed  HPI .  [name] male is a 55 y.o.male presents for follow up from the hospital. Admit date to the hospital was 04/24/20, patient was discharged from the hospital on 04/27/20, patient was admitted for: A left arterial ischemic stroke.  Visit for hospital discharge and establishment of care  Past Medical History:  Diagnosis Date  . Diabetes mellitus without complication ([place])   . Nicotine dependence, cigarettes, uncomplicated 94/17/4081     No Known Allergies    Current Outpatient Medications on File Prior to Visit  Medication Sig Dispense Refill  . aspirin EC 81 MG EC tablet Take 1 tablet (81 mg total) by mouth daily. Swallow whole. 30 tablet 11  . blood glucose meter kit and supplies KIT Dispense based on patient and insurance preference. Use up to four times daily as directed. (FOR ICD-9 250.00, 250.01). 1 each 0  . nicotine (NICODERM CQ - DOSED IN MG/24 HOURS) 14 mg/24hr patch Place 1 patch (14 mg total) onto the skin daily. 28 patch 2  . TRUE METRIX BLOOD GLUCOSE TEST test strip SMARTSIG:Via Meter 1 to 4 Times Daily    . TRUEplus Lancets 28G MISC SMARTSIG:Topical 1 to 4 Times Daily     No current facility-administered medications on file prior to visit.    ROS: all negative except above.   Ryan Hughes was seen today for hospitalization follow-up.  Diagnoses and all orders for this  visit:  Uncontrolled type 2 diabetes mellitus with hyperglycemia, without long-term current use of insulin (HCC) Most recent A1c 14.7.  Discussed the comorbidities related to uncontrolled diabetes.  Discussed  co- morbidities with uncontrol diabetes  Complications -diabetic retinopathy, (close your eyes ? What do you see nothing) nephropathy decrease in kidney function- can lead to dialysis-on a machine 3 days a week to filter your kidney, neuropathy- numbness and tinging in your hands and feet,  increase risk of heart attack and stroke, and amputation due to decrease wound healing and circulation. Decrease your risk by taking medication daily as prescribed, monitor carbohydrates- foods that are high in carbohydrates are the following rice, potatoes, breads, sugars, and pastas.  Reduction in the intake (eating) will assist in lowering your blood sugars. Exercise daily at least 30 minutes daily.  Pertinent ROS:  Polyuria - No Polydipsia - No Vision problems - No  Medications as noted below. Taking them regularly without complication/adverse reaction being reported today.   Objective:  There were no vitals taken for this visit.  BP Readings from Last 3 Encounters:  04/27/20 (!) 145/78  03/17/19 (!) 162/90  03/12/19 (!) 162/92    Wt Readings from Last 3 Encounters:  04/25/20 206 lb 12.7 oz (93.8 kg)  09/17/18 220 lb (99.8 kg)  04/15/18 210 lb (95.3 kg)   Lab Results  Component Value Date   WBC 7.3 04/26/2020   HGB 13.9 04/26/2020   HCT 40.9 04/26/2020   PLT 245 04/26/2020   GLUCOSE 262 (H)  04/26/2020   CHOL 205 (H) 04/25/2020   TRIG 185 (H) 04/25/2020   HDL 38 (L) 04/25/2020   LDLCALC 130 (H) 04/25/2020   ALT 14 04/26/2020   AST 11 (L) 04/26/2020   NA 139 04/26/2020   K 3.3 (L) 04/26/2020   CL 103 04/26/2020   CREATININE 0.66 04/26/2020   BUN 8 04/26/2020   CO2 26 04/26/2020   TSH 0.555 Test methodology is 3rd generation TSH 11/09/2008   HGBA1C 14.2 (H) 04/25/2020      Assessment & Plan:   Ryan Hughes was seen today for hospitalization follow-up.  Diagnoses and all orders for this visit:  Uncontrolled type 2 diabetes mellitus with hyperglycemia, with long-term current use of insulin (HCC  Complications from uncontrolled diabetes -diabetic retinopathy leading to blindness, diabetic nephropathy leading to dialysis, decrease in circulation decrease in sores or wound healing which may lead to amputations and increase of heart attack and stroke  Current Outpatient Medications (Endocrine & Metabolic):  .  metFORMIN (GLUCOPHAGE) 1000 MG tablet, Take 1 tablet (1,000 mg total) by mouth 2 (two) times daily with a meal. .  glipiZIDE (GLUCOTROL) 10 MG tablet, Take 1 tablet (10 mg total) by mouth 2 (two) times daily before a meal. .  insulin aspart (NOVOLOG) 100 UNIT/ML FlexPen, Inject 2-15 Units into the skin 4 (four) times daily -  before meals and at bedtime. Glucose 121 - 150: 2 units, Glucose 151 - 200: 3 units, Glucose 201 - 250: 5 units, Glucose 251 - 300: 8 units, Glucose 301 - 350: 11 units, Glucose 351 - 400: 15 units, Glucose > 400 then call MD .  insulin glargine (LANTUS) 100 UNIT/ML Solostar Pen, Inject 20 Units into the skin daily.  Current Outpatient Medications (Cardiovascular):  .  atorvastatin (LIPITOR) 40 MG tablet, Take 1 tablet (40 mg total) by mouth daily. Marland Kitchen  lisinopril (ZESTRIL) 20 MG tablet, Take 1 tablet (20 mg total) by mouth daily.   Current Outpatient Medications (Analgesics):  .  aspirin EC 81 MG EC tablet, Take 1 tablet (81 mg total) by mouth daily. Swallow whole.   Current Outpatient Medications (Other):  .  blood glucose meter kit and supplies KIT, Dispense based on patient and insurance preference. Use up to four times daily as directed. (FOR ICD-9 250.00, 250.01). .  nicotine (NICODERM CQ - DOSED IN MG/24 HOURS) 14 mg/24hr patch, Place 1 patch (14 mg total) onto the skin daily. .  TRUE METRIX BLOOD GLUCOSE TEST test strip,  SMARTSIG:Via Meter 1 to 4 Times Daily .  TRUEplus Lancets 28G MISC, SMARTSIG:Topical 1 to 4 Times Daily .  Insulin Pen Needle (INSUPEN PEN NEEDLES) 31G X 5 MM MISC, 1 each by Does not apply route in the morning, at noon, in the evening, and at bedtime. .  tamsulosin (FLOMAX) 0.4 MG CAPS capsule, Take 1 capsule (0.4 mg total) by mouth daily after breakfast. -     Benign prostatic hyperplasia (BPH) with straining on urination -     tamsulosin (FLOMAX) 0.4 MG CAPS capsule; Take 1 capsule (0.4 mg total) by mouth daily after breakfast.  Encounter to establish care Establish care with PCP  Hospital discharge follow-up Recommendations for Outpatient Follow-up:  1. Adjust insulin regimen at follow up 2. Adjust BP regimen at follow up  Other orders -     lisinopril (ZESTRIL) 20 MG tablet; Take 1 tablet (20 mg total) by mouth daily.   I have changed Ryan Hughes's glipiZIDE. I am also having him start on  metFORMIN. Additionally, I am having him maintain his aspirin, blood glucose meter kit and supplies, nicotine, True Metrix Blood Glucose Test, TRUEplus Lancets 28G, atorvastatin, insulin aspart, insulin glargine, lisinopril, Insupen Pen Needles, and tamsulosin.  Meds ordered this encounter  Medications  . glipiZIDE (GLUCOTROL) 10 MG tablet    Sig: Take 1 tablet (10 mg total) by mouth 2 (two) times daily before a meal.    Dispense:  180 tablet    Refill:  1  . atorvastatin (LIPITOR) 40 MG tablet    Sig: Take 1 tablet (40 mg total) by mouth daily.    Dispense:  90 tablet    Refill:  3  . insulin aspart (NOVOLOG) 100 UNIT/ML FlexPen    Sig: Inject 2-15 Units into the skin 4 (four) times daily -  before meals and at bedtime. Glucose 121 - 150: 2 units, Glucose 151 - 200: 3 units, Glucose 201 - 250: 5 units, Glucose 251 - 300: 8 units, Glucose 301 - 350: 11 units, Glucose 351 - 400: 15 units, Glucose > 400 then call MD    Dispense:  15 mL    Refill:  11  . insulin glargine (LANTUS) 100 UNIT/ML  Solostar Pen    Sig: Inject 20 Units into the skin daily.    Dispense:  15 mL    Refill:  11  . lisinopril (ZESTRIL) 20 MG tablet    Sig: Take 1 tablet (20 mg total) by mouth daily.    Dispense:  30 tablet    Refill:  11  . Insulin Pen Needle (INSUPEN PEN NEEDLES) 31G X 5 MM MISC    Sig: 1 each by Does not apply route in the morning, at noon, in the evening, and at bedtime.    Dispense:  100 each    Refill:  11  . tamsulosin (FLOMAX) 0.4 MG CAPS capsule    Sig: Take 1 capsule (0.4 mg total) by mouth daily after breakfast.    Dispense:  30 capsule    Refill:  3  . metFORMIN (GLUCOPHAGE) 1000 MG tablet    Sig: Take 1 tablet (1,000 mg total) by mouth 2 (two) times daily with a meal.    Dispense:  180 tablet    Refill:  3     Follow-up:   Return in about 3 months (around 08/19/2020) for fasting labs , DM/HTN.  The above assessment and management plan was discussed with the patient. The patient verbalized understanding of and has agreed to the management plan. Patient is aware to call the clinic if symptoms fail to improve or worsen. Patient is aware when to return to the clinic for a follow-up visit. Patient educated on when it is appropriate to go to the emergency department. -      I discussed the assessment and treatment plan with the patient. The patient was provided an opportunity to ask questions and all were answered. The patient agreed with the plan and demonstrated an understanding of the instructions.   The patient was advised to call back or seek an in-person evaluation if the symptoms worsen or if the condition fails to improve as anticipated.  I provided 45 minutes of non-face-to-face time during this encounter.   [name], NP

## 2020-06-30 ENCOUNTER — Encounter (HOSPITAL_COMMUNITY): Payer: Self-pay

## 2020-06-30 ENCOUNTER — Other Ambulatory Visit: Payer: Self-pay

## 2020-06-30 ENCOUNTER — Emergency Department (HOSPITAL_COMMUNITY): Payer: Self-pay

## 2020-06-30 ENCOUNTER — Emergency Department (HOSPITAL_COMMUNITY)
Admission: EM | Admit: 2020-06-30 | Discharge: 2020-06-30 | Disposition: A | Discharge status: Home / Self Care | Payer: Self-pay | Attending: Emergency Medicine | Admitting: Emergency Medicine

## 2020-06-30 DIAGNOSIS — M25572 Pain in left ankle and joints of left foot: Secondary | ICD-10-CM | POA: Insufficient documentation

## 2020-06-30 DIAGNOSIS — Z8673 Personal history of transient ischemic attack (TIA), and cerebral infarction without residual deficits: Secondary | ICD-10-CM | POA: Insufficient documentation

## 2020-06-30 DIAGNOSIS — Z7984 Long term (current) use of oral hypoglycemic drugs: Secondary | ICD-10-CM | POA: Insufficient documentation

## 2020-06-30 DIAGNOSIS — Z794 Long term (current) use of insulin: Secondary | ICD-10-CM | POA: Insufficient documentation

## 2020-06-30 DIAGNOSIS — F17210 Nicotine dependence, cigarettes, uncomplicated: Secondary | ICD-10-CM | POA: Insufficient documentation

## 2020-06-30 DIAGNOSIS — R52 Pain, unspecified: Secondary | ICD-10-CM

## 2020-06-30 DIAGNOSIS — E1165 Type 2 diabetes mellitus with hyperglycemia: Secondary | ICD-10-CM | POA: Insufficient documentation

## 2020-06-30 DIAGNOSIS — Z7982 Long term (current) use of aspirin: Secondary | ICD-10-CM | POA: Insufficient documentation

## 2020-06-30 LAB — CBG MONITORING, ED: Glucose-Capillary: 132 mg/dL — ABNORMAL HIGH (ref 70–99)

## 2020-06-30 IMAGING — CR DG ANKLE COMPLETE 3+V*L*
3 series · 3 of 3 positions shown · non-contrast
Comparison: None.

CLINICAL DATA: Pain

EXAM:
LEFT ANKLE COMPLETE - 3+ VIEW

[ankle ap]
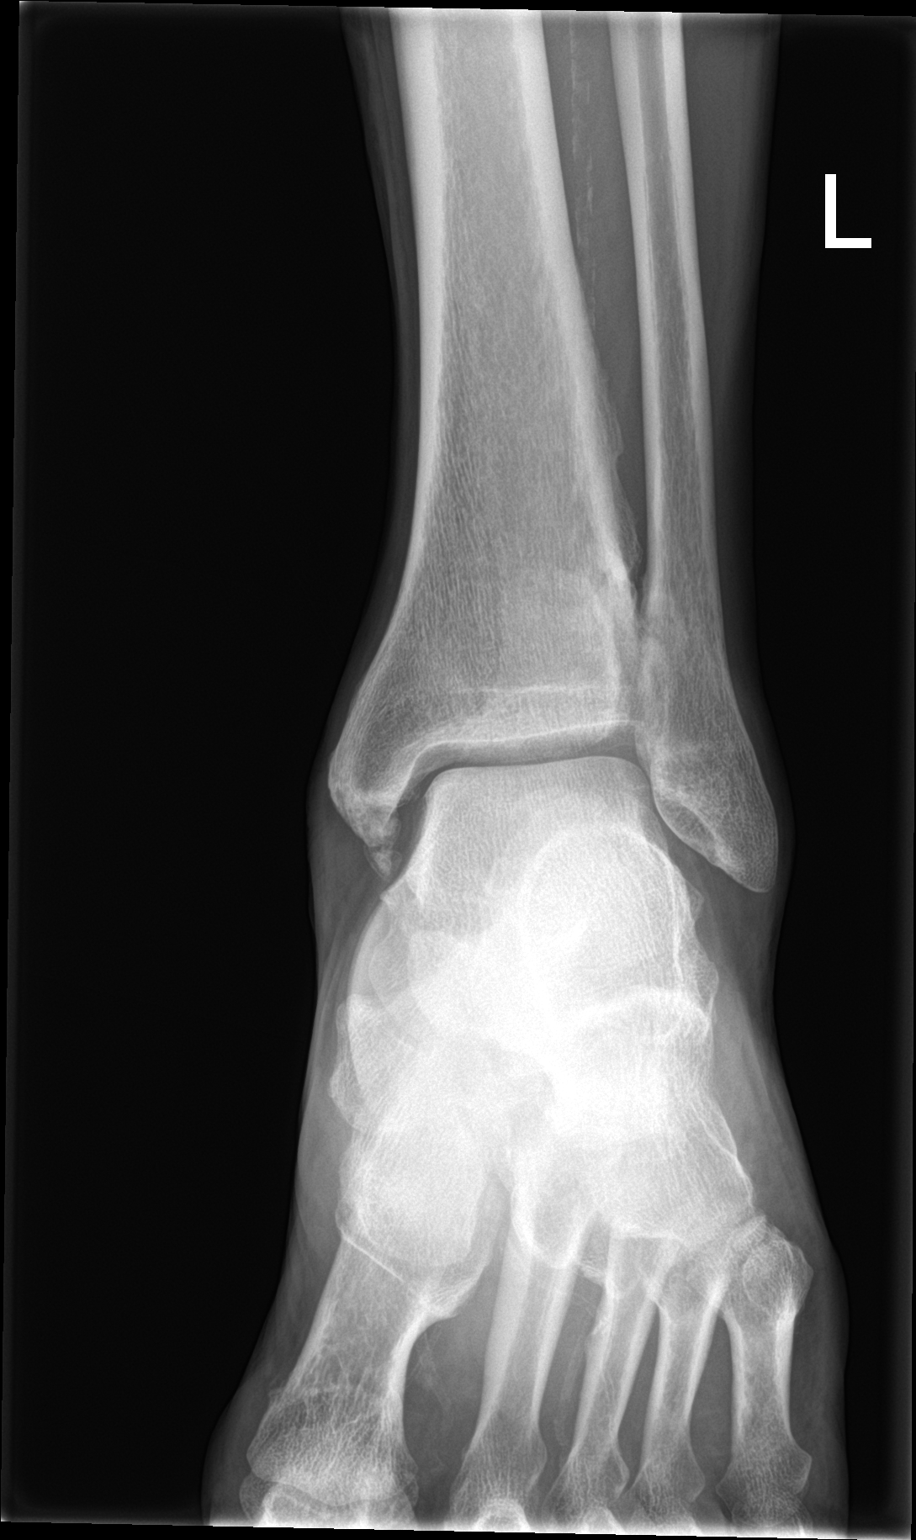

[ankle obl]
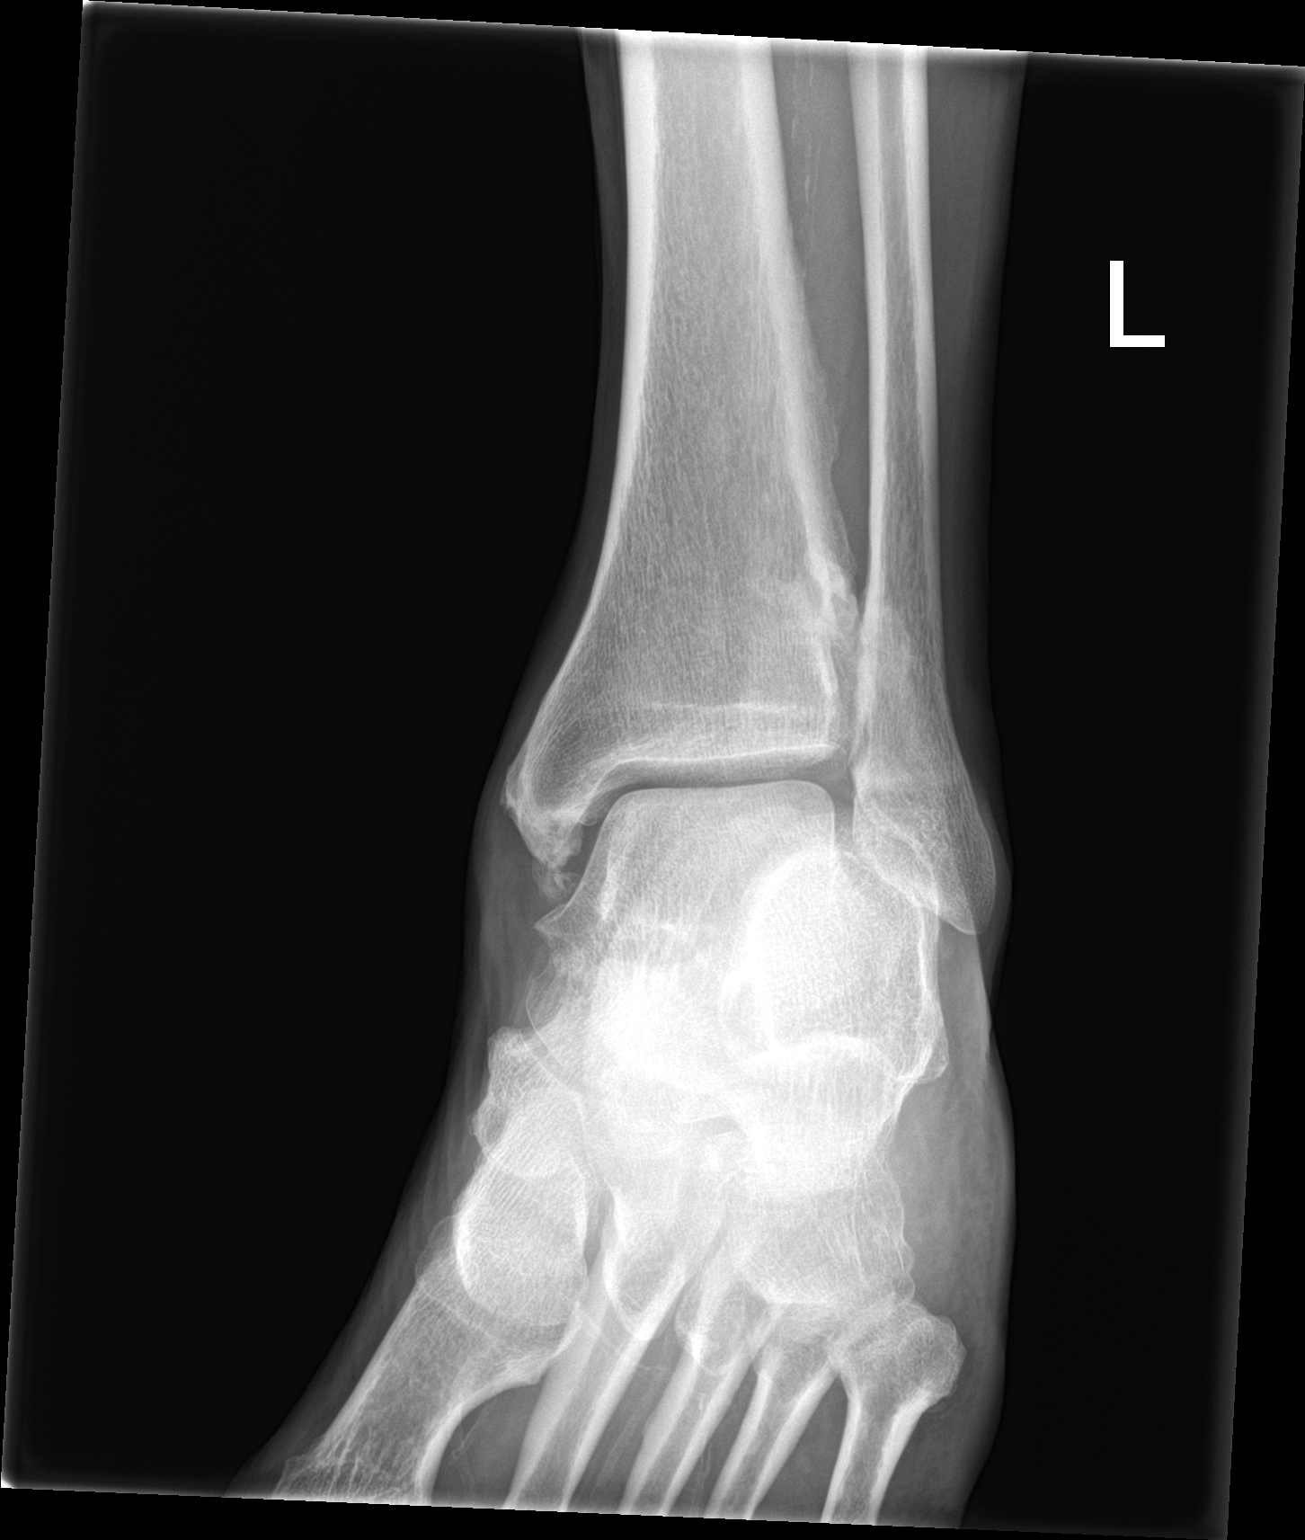

[ankle lat]
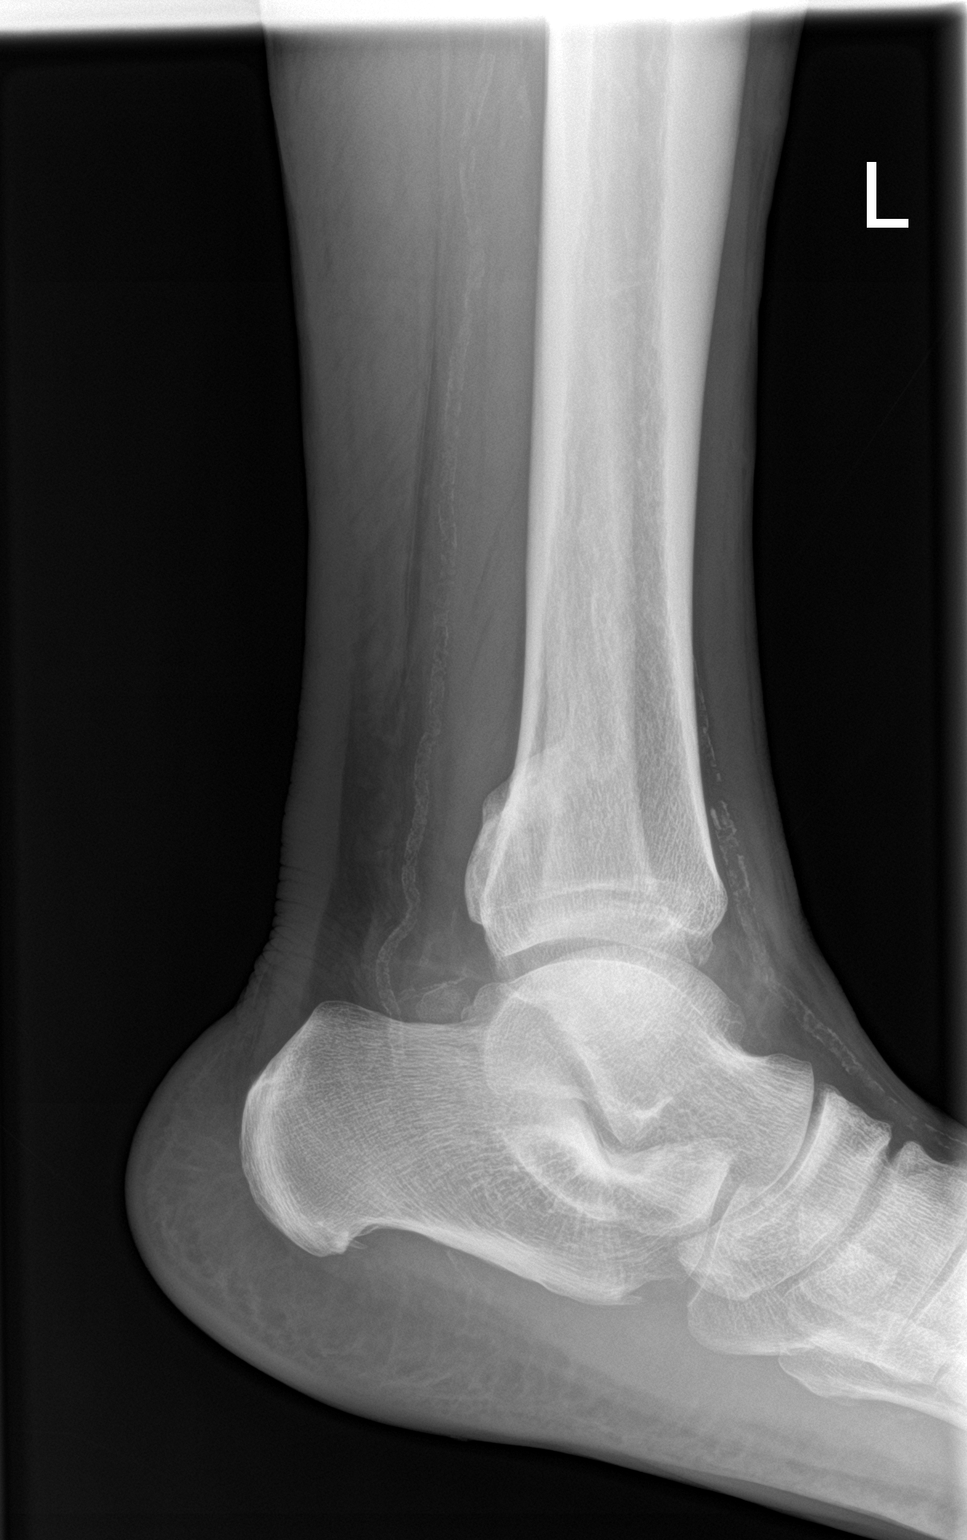

[3 of 3 positions shown; findings below may reference images not displayed]

FINDINGS: Frontal, oblique, and lateral views were obtained. No acute
fracture. Evidence of old fracture of the medial malleolus with
remodeling. There is a joint effusion. There is no joint space
narrowing or erosion. Multiple foci of arterial vascular
calcification noted. There is a small inferior calcaneal spur. Ankle
mortise appears intact.
IMPRESSION: Evidence of old healed fracture of the medial malleolus. No acute
fracture evident. There is a joint effusion which may have
arthropathic etiology. No appreciable joint space narrowing or
erosion. Small inferior calcaneal spur noted. Foci of arterial
vascular calcification noted.

## 2020-06-30 IMAGING — CR DG FOOT COMPLETE 3+V*L*
3 series · 3 of 3 positions shown · non-contrast
Comparison: [DATE]

CLINICAL DATA: Pain and inability to bear weight

EXAM:
LEFT FOOT - COMPLETE 3+ VIEW

[foot ap]
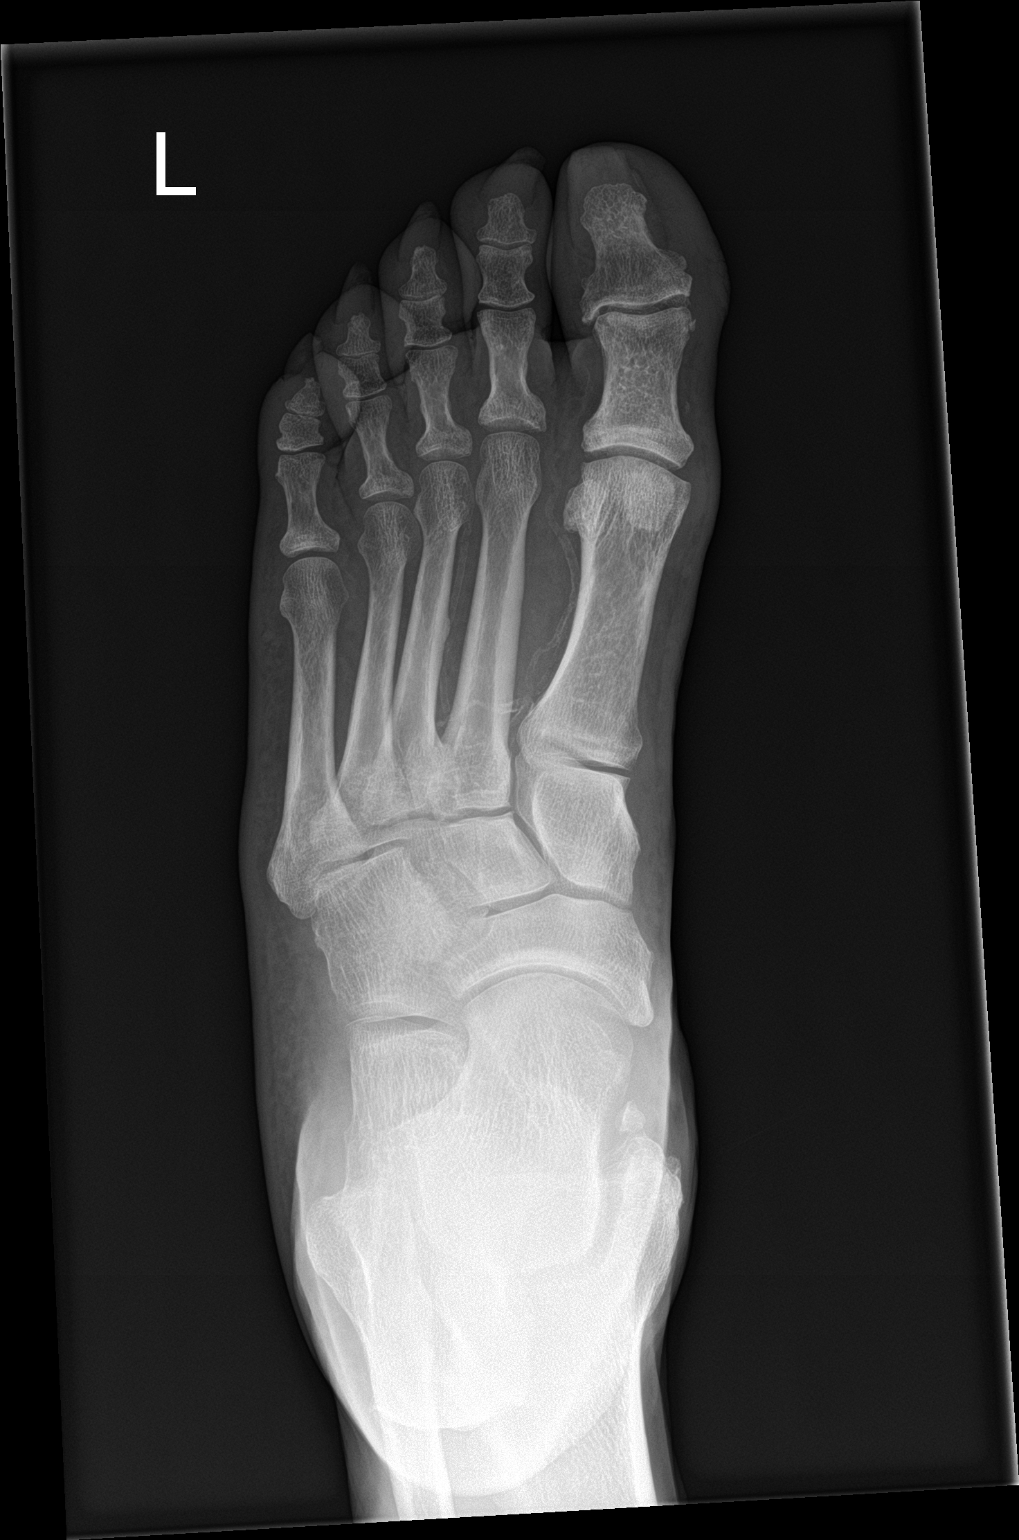

[foot obl]
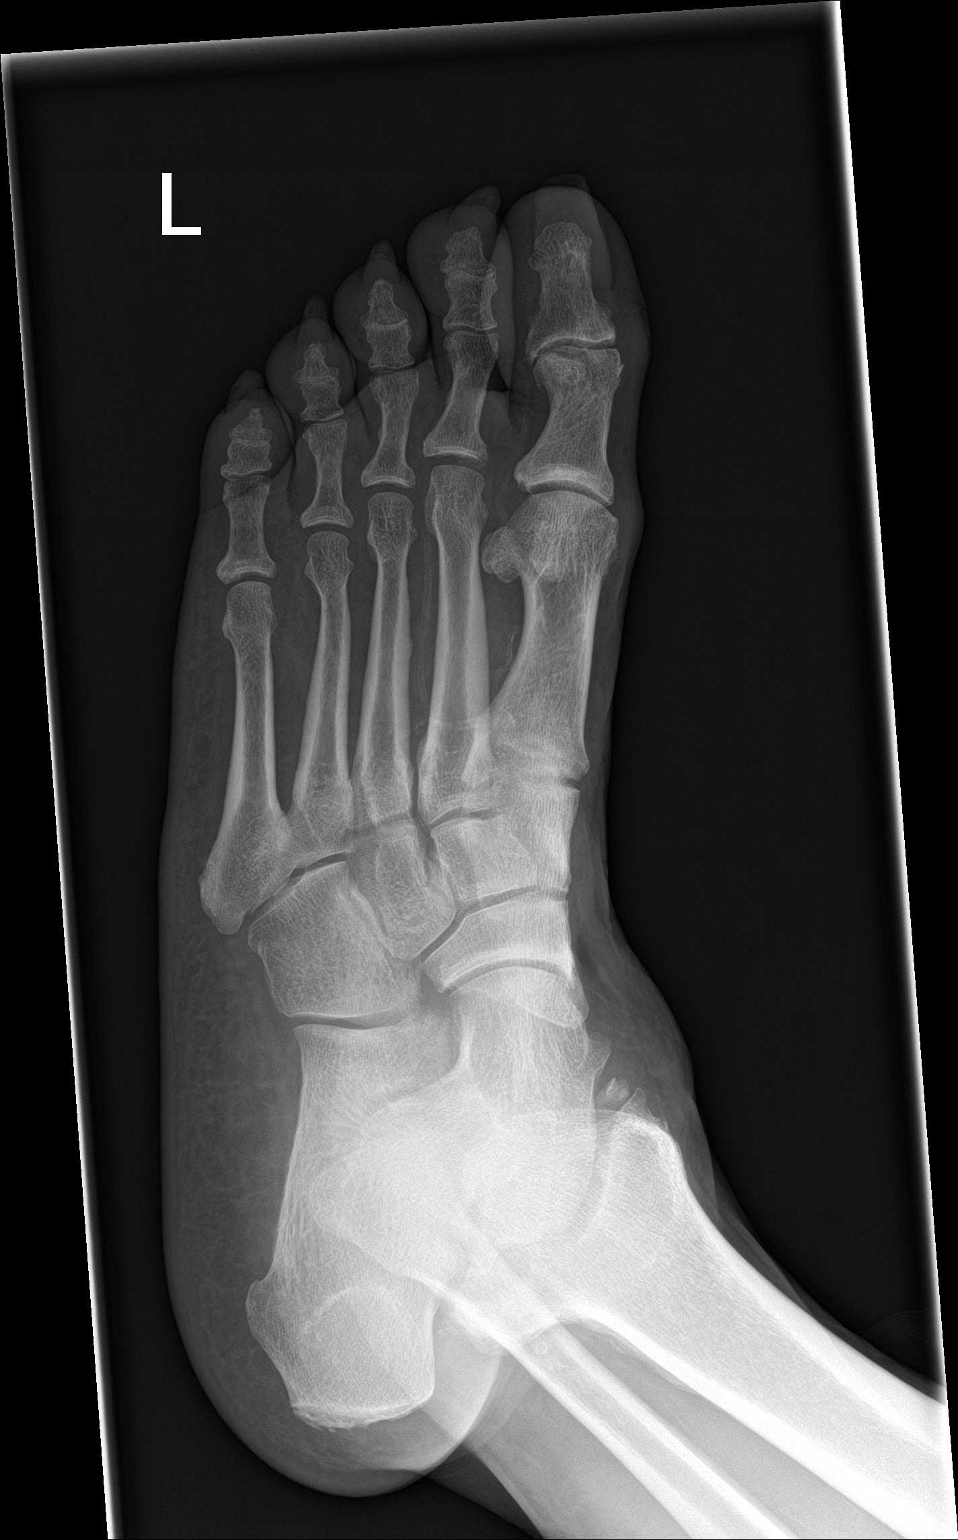

[foot lat]
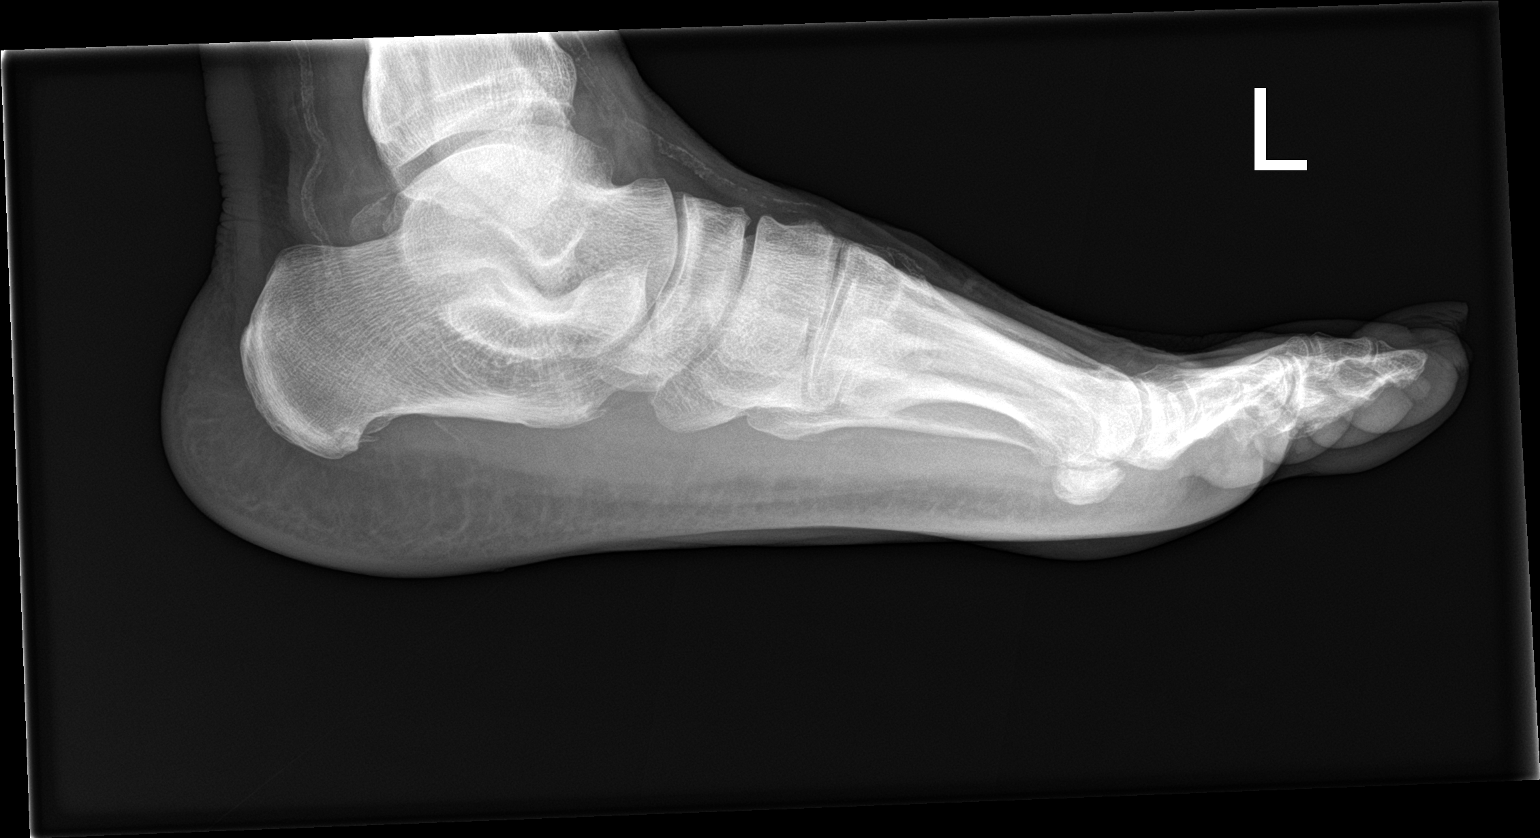

[3 of 3 positions shown; findings below may reference images not displayed]

FINDINGS: Frontal, oblique, and lateral views were obtained. No fracture or
dislocation. There is mild narrowing of the first MTP and IP joints.
Other joint spaces appear normal. No erosive change. There is a
small inferior calcaneal spur. There are multiple foci of arterial
vascular calcification.
IMPRESSION: No fracture or dislocation. Mild narrowing first MTP and IP joints.
Small inferior calcaneal spur. Multiple foci of arterial vascular
calcification evident.

## 2020-06-30 MED ORDER — NAPROXEN 500 MG PO TABS: 500.0000 mg | ORAL_TABLET | Freq: Two times a day (BID) | ORAL | 0 refills | Status: AC

## 2020-06-30 NOTE — ED Triage Notes (Signed)
Patient reports that he awoke this am with left foot pain. denies swelling, denies trauma. NAD

## 2020-06-30 NOTE — Discharge Instructions (Signed)
Take naproxen as directed.    You were given information to follow-up with a primary care doctor and/or an orthopedic doctor about your ankle.  You should follow up with either of these providers within the 5 to 7 days for reassessment.    If your symptoms are not significantly improving in the next 1 to 2 days or if you have any new or worsening symptoms in the meantime then you should return to the emergency department for reassessment.

## 2020-06-30 NOTE — ED Provider Notes (Signed)
[facility] EMERGENCY DEPARTMENT Provider Note   CSN: [id number] Arrival date & time: 06/30/20  1148     History Chief Complaint  Patient presents with  . Ankle Pain    Ryan Hughes is a 55 y.o. male.  HPI   55 year old male with a history of diabetes, CVA, hyperlipidemia, who presents the emergency department today for evaluation of left ankle pain.  States that he went to bed and did not have any symptoms of woke up and had pain and swelling to his left ankle.  He does not remember any trauma to the area.  He states that he drinks beer frequently and had some beer and hamburgers last night.  He has no known history of gout.  He denies any fevers or systemic symptoms.  Past Medical History:  Diagnosis Date  . Diabetes mellitus without complication ([place])   . Nicotine dependence, cigarettes, uncomplicated 40/98/1191    Patient Active Problem List   Diagnosis Date Noted  . Acute arterial ischemic stroke, vertebrobasilar, thalamic, left ([place]) 04/25/2020  . Uncontrolled type 2 diabetes mellitus with hyperglycemia, without long-term current use of insulin ([place]) 04/25/2020  . Mixed hyperlipidemia due to type 2 diabetes mellitus ([place]) 04/25/2020  . Benign prostatic hyperplasia (BPH) with straining on urination 04/25/2020  . Nicotine dependence, cigarettes, uncomplicated 47/82/9562  . Stroke ([place]) 04/25/2020  . Rash 03/19/2013  . Urinary hesitancy 03/19/2013  . Back spasm 05/16/2012  . Foot pain 07/18/2011  . Diabetes mellitus ([place]) 06/18/2011  . Tingling in extremities 06/18/2011    History reviewed. No pertinent surgical history.     Family History  Problem Relation Age of Onset  . Stroke Mother     Social History   Tobacco Use  . Smoking status: Current Every Day Smoker    Packs/day: 0.50    Types: Cigarettes  . Smokeless tobacco: Never Used  Substance Use Topics  . Alcohol use: Yes  . Drug use: Yes    Types: Marijuana    Home  Medications Prior to Admission medications   Medication Sig Start Date End Date Taking? Authorizing Provider  naproxen (NAPROSYN) 500 MG tablet Take 1 tablet (500 mg total) by mouth 2 (two) times daily for 7 days. 06/30/20 07/07/20 Yes Keanan Melander S, PA-C  aspirin EC 81 MG EC tablet Take 1 tablet (81 mg total) by mouth daily. Swallow whole. 04/28/20   Dwyane Dee, MD  atorvastatin (LIPITOR) 40 MG tablet Take 1 tablet (40 mg total) by mouth daily. 05/21/20   [name], NP  blood glucose meter kit and supplies KIT Dispense based on patient and insurance preference. Use up to four times daily as directed. (FOR ICD-9 250.00, 250.01). 04/27/20   Dwyane Dee, MD  glipiZIDE (GLUCOTROL) 10 MG tablet Take 1 tablet (10 mg total) by mouth 2 (two) times daily before a meal. 05/21/20 05/21/21  [name], NP  insulin aspart (NOVOLOG) 100 UNIT/ML FlexPen Inject 2-15 Units into the skin 4 (four) times daily -  before meals and at bedtime. Glucose 121 - 150: 2 units, Glucose 151 - 200: 3 units, Glucose 201 - 250: 5 units, Glucose 251 - 300: 8 units, Glucose 301 - 350: 11 units, Glucose 351 - 400: 15 units, Glucose > 400 then call MD 05/21/20   [name], NP  insulin glargine (LANTUS) 100 UNIT/ML Solostar Pen Inject 20 Units into the skin daily. 05/21/20   [name], NP  Insulin Pen Needle (INSUPEN PEN NEEDLES)  31G X 5 MM MISC 1 each by Does not apply route in the morning, at noon, in the evening, and at bedtime. 05/21/20   [name], NP  lisinopril (ZESTRIL) 20 MG tablet Take 1 tablet (20 mg total) by mouth daily. 05/21/20 05/21/21  [name], NP  metFORMIN (GLUCOPHAGE) 1000 MG tablet Take 1 tablet (1,000 mg total) by mouth 2 (two) times daily with a meal. 05/21/20   [name], NP  nicotine (NICODERM CQ - DOSED IN MG/24 HOURS) 14 mg/24hr patch Place 1 patch (14 mg total) onto the skin daily. 04/28/20   Dwyane Dee, MD  tamsulosin (FLOMAX) 0.4 MG  CAPS capsule Take 1 capsule (0.4 mg total) by mouth daily after breakfast. 05/21/20   [name], NP  TRUE METRIX BLOOD GLUCOSE TEST test strip SMARTSIG:Via Meter 1 to 4 Times Daily 04/27/20   [provider]  TRUEplus Lancets 28G MISC SMARTSIG:Topical 1 to 4 Times Daily 04/27/20   [provider]    Allergies    Patient has no known allergies.  Review of Systems   Review of Systems  Constitutional: Negative for fever.  Respiratory: Negative for shortness of breath.   Cardiovascular: Negative for chest pain.  Gastrointestinal: Negative for abdominal pain.  Musculoskeletal:       Left ankle pain  Neurological: Negative for weakness and numbness.    Physical Exam Updated Vital Signs BP (!) 149/93 (BP Location: Right Arm)   Pulse 78   Temp 98.9 F (37.2 C)   Resp 18   SpO2 100%   Physical Exam Constitutional:      General: He is not in acute distress.    Appearance: He is well-developed and well-nourished.  Eyes:     Conjunctiva/sclera: Conjunctivae normal.  Cardiovascular:     Rate and Rhythm: Normal rate.  Pulmonary:     Effort: Pulmonary effort is normal.  Musculoskeletal:     Comments: TTP and swelling noted to the left ankle. Appears to have a joint effusion. No erythema, warmth or induration. Able to range the ankle actively and passively. DP pulses 2+.   Skin:    General: Skin is warm and dry.  Neurological:     Mental Status: He is alert and oriented to person, place, and time.     ED Results / Procedures / Treatments   Labs (all labs ordered are listed, but only abnormal results are displayed) Labs Reviewed  CBG MONITORING, ED - Abnormal; Notable for the following components:      Result Value   Glucose-Capillary 132 (*)    All other components within normal limits    EKG None  Radiology DG Ankle Complete Left  Result Date: 06/30/2020 CLINICAL DATA:  Pain EXAM: LEFT ANKLE COMPLETE - 3+ VIEW COMPARISON:  None. FINDINGS:  Frontal, oblique, and lateral views were obtained. No acute fracture. Evidence of old fracture of the medial malleolus with remodeling. There is a joint effusion. There is no joint space narrowing or erosion. Multiple foci of arterial vascular calcification noted. There is a small inferior calcaneal spur. Ankle mortise appears intact. IMPRESSION: Evidence of old healed fracture of the medial malleolus. No acute fracture evident. There is a joint effusion which may have arthropathic etiology. No appreciable joint space narrowing or erosion. Small inferior calcaneal spur noted. Foci of arterial vascular calcification noted. Electronically Signed   By: Lowella Grip III M.D.   On: 06/30/2020 13:19   DG Foot Complete Left  Result Date:  06/30/2020 CLINICAL DATA:  Pain and inability to bear weight EXAM: LEFT FOOT - COMPLETE 3+ VIEW COMPARISON:  March 12, 2019 FINDINGS: Frontal, oblique, and lateral views were obtained. No fracture or dislocation. There is mild narrowing of the first MTP and IP joints. Other joint spaces appear normal. No erosive change. There is a small inferior calcaneal spur. There are multiple foci of arterial vascular calcification. IMPRESSION: No fracture or dislocation. Mild narrowing first MTP and IP joints. Small inferior calcaneal spur. Multiple foci of arterial vascular calcification evident. Electronically Signed   By: Lowella Grip III M.D.   On: 06/30/2020 13:17    Procedures Procedures   Medications Ordered in ED Medications - No data to display  ED Course  I have reviewed the triage vital signs and the nursing notes.  Pertinent labs & imaging results that were available during my care of the patient were reviewed by me and considered in my medical decision making (see chart for details).    MDM Rules/Calculators/A&P                          55 year old male presenting to the emergency department today for evaluation of left ankle pain.  He denies any known  injury.  Does admit to drinking beer and having hamburgers last night.  No known history of gout.  Does appear to have a ankle effusion to the left side.  Lower suspicion for septic joint as there is no erythema, warmth or fevers noted.  He is able to range the ankle somewhat.    X-ray of the left foot - No fracture or dislocation. Mild narrowing first MTP and IP joints. Small inferior calcaneal spur. Multiple foci of arterial vascular calcification evident. Xray left ankle - Evidence of old healed fracture of the medial malleolus. No acute fracture evident. There is a joint effusion which may have arthropathic etiology. No appreciable joint space narrowing or erosion. Small inferior calcaneal spur noted. Foci of arterial vascular calcification noted.  Given no known history of gout, discussed with patient that we are not able to definitively rule out septic arthritis and recommended arthrocentesis however he declined at this time and would prefer to be treated with medications.  He does agree to return to emergency department for new or worsening symptoms in the meantime.  He voices understanding of the plan and reasons to return.  All Questions answered.  Patient stable for discharge.  Final Clinical Impression(s) / ED Diagnoses Final diagnoses:  Pain  Acute left ankle pain    Rx / DC Orders ED Discharge Orders         Ordered    naproxen (NAPROSYN) 500 MG tablet  2 times daily        06/30/20 [address], Graylin Sperling S, PA-C 06/30/20 Ward, [place], DO 06/30/20 1456

## 2020-07-01 ENCOUNTER — Other Ambulatory Visit: Payer: Self-pay | Admitting: Pharmacist

## 2020-07-01 MED ORDER — INSULIN LISPRO (1 UNIT DIAL) 100 UNIT/ML (KWIKPEN): PEN_INJECTOR | SUBCUTANEOUS | 2 refills | Status: DC

## 2020-07-01 MED FILL — LISINOPRIL 20 MG TABLET: 20 | 30 days supply | Qty: 30 | Fill #0

## 2020-07-01 MED FILL — METFORMIN HCL 1000 MG TABS: 1000 | 30 days supply | Qty: 60 | Fill #0

## 2020-07-01 MED FILL — TAMSULOSIN HCL 0.4 MG CAP: 0.4 | 30 days supply | Qty: 30 | Fill #0

## 2020-07-01 MED FILL — glipiZIDE 10 MG TABS: 10 | 30 days supply | Qty: 60 | Fill #0

## 2020-07-01 MED FILL — !BASAGLAR 100 UNIT/ML KWIK: 100 | 30 days supply | Qty: 6 | Fill #0

## 2020-07-01 MED FILL — ATORVASTATIN CALCIUM 40 MG: 40 | 30 days supply | Qty: 30 | Fill #0

## 2020-07-01 MED FILL — TRUEPLUS 5-BEVEL PEN NEEDLE: 31G X 5 MM | 20 days supply | Qty: 100 | Fill #0

## 2020-07-01 MED FILL — !HUMALOG 100 UNITS/ML KWIKP: 100 | 25 days supply | Qty: 15 | Fill #0

## 2020-08-04 ENCOUNTER — Encounter (HOSPITAL_COMMUNITY): Payer: Self-pay

## 2020-08-04 ENCOUNTER — Emergency Department (HOSPITAL_COMMUNITY)
Admission: EM | Admit: 2020-08-04 | Discharge: 2020-08-04 | Disposition: A | Discharge status: Home / Self Care | Payer: Self-pay | Attending: Emergency Medicine | Admitting: Emergency Medicine

## 2020-08-04 ENCOUNTER — Other Ambulatory Visit: Payer: Self-pay

## 2020-08-04 DIAGNOSIS — Z5321 Procedure and treatment not carried out due to patient leaving prior to being seen by health care provider: Secondary | ICD-10-CM | POA: Insufficient documentation

## 2020-08-04 DIAGNOSIS — M10072 Idiopathic gout, left ankle and foot: Secondary | ICD-10-CM | POA: Insufficient documentation

## 2020-08-04 NOTE — ED Notes (Signed)
Pt called him a ride and stated he was leaving because we are not doing anything for him

## 2020-08-04 NOTE — ED Triage Notes (Signed)
Patient complains of ongoing pain to left foot after being treated for gout 1 week ago. Requesting pain meds

## 2020-08-05 ENCOUNTER — Encounter (HOSPITAL_COMMUNITY): Payer: Self-pay

## 2020-08-05 ENCOUNTER — Other Ambulatory Visit: Payer: Self-pay

## 2020-08-05 ENCOUNTER — Emergency Department (HOSPITAL_COMMUNITY): Payer: Self-pay

## 2020-08-05 ENCOUNTER — Inpatient Hospital Stay (HOSPITAL_COMMUNITY)
Admission: EM | Admit: 2020-08-05 | Discharge: 2020-08-07 | DRG: 074 | Disposition: A | Discharge status: Home / Self Care | Payer: Self-pay | Attending: Internal Medicine | Admitting: Internal Medicine

## 2020-08-05 DIAGNOSIS — Z794 Long term (current) use of insulin: Secondary | ICD-10-CM

## 2020-08-05 DIAGNOSIS — R112 Nausea with vomiting, unspecified: Secondary | ICD-10-CM

## 2020-08-05 DIAGNOSIS — M25572 Pain in left ankle and joints of left foot: Secondary | ICD-10-CM | POA: Diagnosis present

## 2020-08-05 DIAGNOSIS — M109 Gout, unspecified: Secondary | ICD-10-CM | POA: Diagnosis present

## 2020-08-05 DIAGNOSIS — F1290 Cannabis use, unspecified, uncomplicated: Secondary | ICD-10-CM | POA: Diagnosis present

## 2020-08-05 DIAGNOSIS — I160 Hypertensive urgency: Secondary | ICD-10-CM | POA: Diagnosis present

## 2020-08-05 DIAGNOSIS — E1165 Type 2 diabetes mellitus with hyperglycemia: Secondary | ICD-10-CM | POA: Diagnosis present

## 2020-08-05 DIAGNOSIS — E119 Type 2 diabetes mellitus without complications: Secondary | ICD-10-CM

## 2020-08-05 DIAGNOSIS — Z823 Family history of stroke: Secondary | ICD-10-CM

## 2020-08-05 DIAGNOSIS — E1143 Type 2 diabetes mellitus with diabetic autonomic (poly)neuropathy: Principal | ICD-10-CM | POA: Diagnosis present

## 2020-08-05 DIAGNOSIS — Z79899 Other long term (current) drug therapy: Secondary | ICD-10-CM

## 2020-08-05 DIAGNOSIS — E860 Dehydration: Secondary | ICD-10-CM

## 2020-08-05 DIAGNOSIS — E1169 Type 2 diabetes mellitus with other specified complication: Secondary | ICD-10-CM

## 2020-08-05 DIAGNOSIS — Z7984 Long term (current) use of oral hypoglycemic drugs: Secondary | ICD-10-CM

## 2020-08-05 DIAGNOSIS — I10 Essential (primary) hypertension: Secondary | ICD-10-CM | POA: Diagnosis present

## 2020-08-05 DIAGNOSIS — F17210 Nicotine dependence, cigarettes, uncomplicated: Secondary | ICD-10-CM | POA: Diagnosis present

## 2020-08-05 DIAGNOSIS — Z20822 Contact with and (suspected) exposure to covid-19: Secondary | ICD-10-CM | POA: Diagnosis present

## 2020-08-05 HISTORY — DX: Essential (primary) hypertension: I10

## 2020-08-05 LAB — URINALYSIS, ROUTINE W REFLEX MICROSCOPIC
Bacteria, UA: NONE SEEN
Bilirubin Urine: NEGATIVE
Glucose, UA: >=500 mg/dL — AB
Ketones, ur: 80 mg/dL — AB
Leukocytes,Ua: NEGATIVE
Nitrite: NEGATIVE
Protein, ur: >=300 mg/dL — AB
Specific Gravity, Urine: 1.028 (ref 1.005–1.030)
pH: 6 (ref 5.0–8.0)

## 2020-08-05 LAB — I-STAT VENOUS BLOOD GAS, ED
Acid-Base Excess: 9 mmol/L — ABNORMAL HIGH (ref 0.0–2.0)
Bicarbonate: 35 mmol/L — ABNORMAL HIGH (ref 20.0–28.0)
Calcium, Ion: 1.11 mmol/L — ABNORMAL LOW (ref 1.15–1.40)
HCT: 49 % (ref 39.0–52.0)
Hemoglobin: 16.7 g/dL (ref 13.0–17.0)
O2 Saturation: 69 %
Potassium: 3.6 mmol/L (ref 3.5–5.1)
Sodium: 143 mmol/L (ref 135–145)
TCO2: 37 mmol/L — ABNORMAL HIGH (ref 22–32)
pCO2, Ven: 51.6 mmHg (ref 44.0–60.0)
pH, Ven: 7.44 — ABNORMAL HIGH (ref 7.250–7.430)
pO2, Ven: 36 mmHg (ref 32.0–45.0)

## 2020-08-05 LAB — CBC
HCT: 49.6 % (ref 39.0–52.0)
Hemoglobin: 17.6 g/dL — ABNORMAL HIGH (ref 13.0–17.0)
MCH: 31.2 pg (ref 26.0–34.0)
MCHC: 35.5 g/dL (ref 30.0–36.0)
MCV: 87.9 fL (ref 80.0–100.0)
Platelets: 363 10*3/uL (ref 150–400)
RBC: 5.64 MIL/uL (ref 4.22–5.81)
RDW: 13.6 % (ref 11.5–15.5)
WBC: 11.5 10*3/uL — ABNORMAL HIGH (ref 4.0–10.5)
nRBC: 0 % (ref 0.0–0.2)

## 2020-08-05 LAB — RESP PANEL BY RT-PCR (FLU A&B, COVID) ARPGX2
Influenza A by PCR: NEGATIVE
Influenza B by PCR: NEGATIVE
SARS Coronavirus 2 by RT PCR: NEGATIVE

## 2020-08-05 LAB — COMPREHENSIVE METABOLIC PANEL
ALT: 14 U/L (ref 0–44)
AST: 11 U/L — ABNORMAL LOW (ref 15–41)
Albumin: 3.8 g/dL (ref 3.5–5.0)
Alkaline Phosphatase: 90 U/L (ref 38–126)
Anion gap: 15 (ref 5–15)
BUN: 15 mg/dL (ref 6–20)
CO2: 30 mmol/L (ref 22–32)
Calcium: 9.7 mg/dL (ref 8.9–10.3)
Chloride: 94 mmol/L — ABNORMAL LOW (ref 98–111)
Creatinine, Ser: 0.95 mg/dL (ref 0.61–1.24)
GFR, Estimated: >60 mL/min (ref >60)
Glucose, Bld: 357 mg/dL — ABNORMAL HIGH (ref 70–99)
Potassium: 3.6 mmol/L (ref 3.5–5.1)
Sodium: 139 mmol/L (ref 135–145)
Total Bilirubin: 1 mg/dL (ref 0.3–1.2)
Total Protein: 7.5 g/dL (ref 6.5–8.1)

## 2020-08-05 LAB — HEMOGLOBIN A1C
Hgb A1c MFr Bld: 9.8 % — ABNORMAL HIGH (ref 4.8–5.6)
Mean Plasma Glucose: 234.56 mg/dL

## 2020-08-05 LAB — CBG MONITORING, ED
Glucose-Capillary: 289 mg/dL — ABNORMAL HIGH (ref 70–99)
Glucose-Capillary: 268 mg/dL — ABNORMAL HIGH (ref 70–99)
Glucose-Capillary: 322 mg/dL — ABNORMAL HIGH (ref 70–99)

## 2020-08-05 LAB — RAPID URINE DRUG SCREEN, HOSP PERFORMED
Amphetamines: NOT DETECTED
Barbiturates: NOT DETECTED
Benzodiazepines: NOT DETECTED
Cocaine: NOT DETECTED
Opiates: NOT DETECTED
Tetrahydrocannabinol: POSITIVE — AB

## 2020-08-05 LAB — BETA-HYDROXYBUTYRIC ACID: Beta-Hydroxybutyric Acid: 2.1 mmol/L — ABNORMAL HIGH (ref 0.05–0.27)

## 2020-08-05 LAB — LIPASE, BLOOD: Lipase: 24 U/L (ref 11–51)

## 2020-08-05 LAB — LACTIC ACID, PLASMA: Lactic Acid, Venous: 1.9 mmol/L (ref 0.5–1.9)

## 2020-08-05 LAB — TROPONIN I (HIGH SENSITIVITY): Troponin I (High Sensitivity): 12 ng/L (ref <18)

## 2020-08-05 IMAGING — DX DG ANKLE COMPLETE 3+V*L*
3 series · 3 of 3 positions shown · non-contrast
Comparison: X-ray [DATE]

CLINICAL DATA: Left ankle pain and swelling.  History of gout

EXAM:
LEFT ANKLE COMPLETE - 3+ VIEW

[ankle ap]
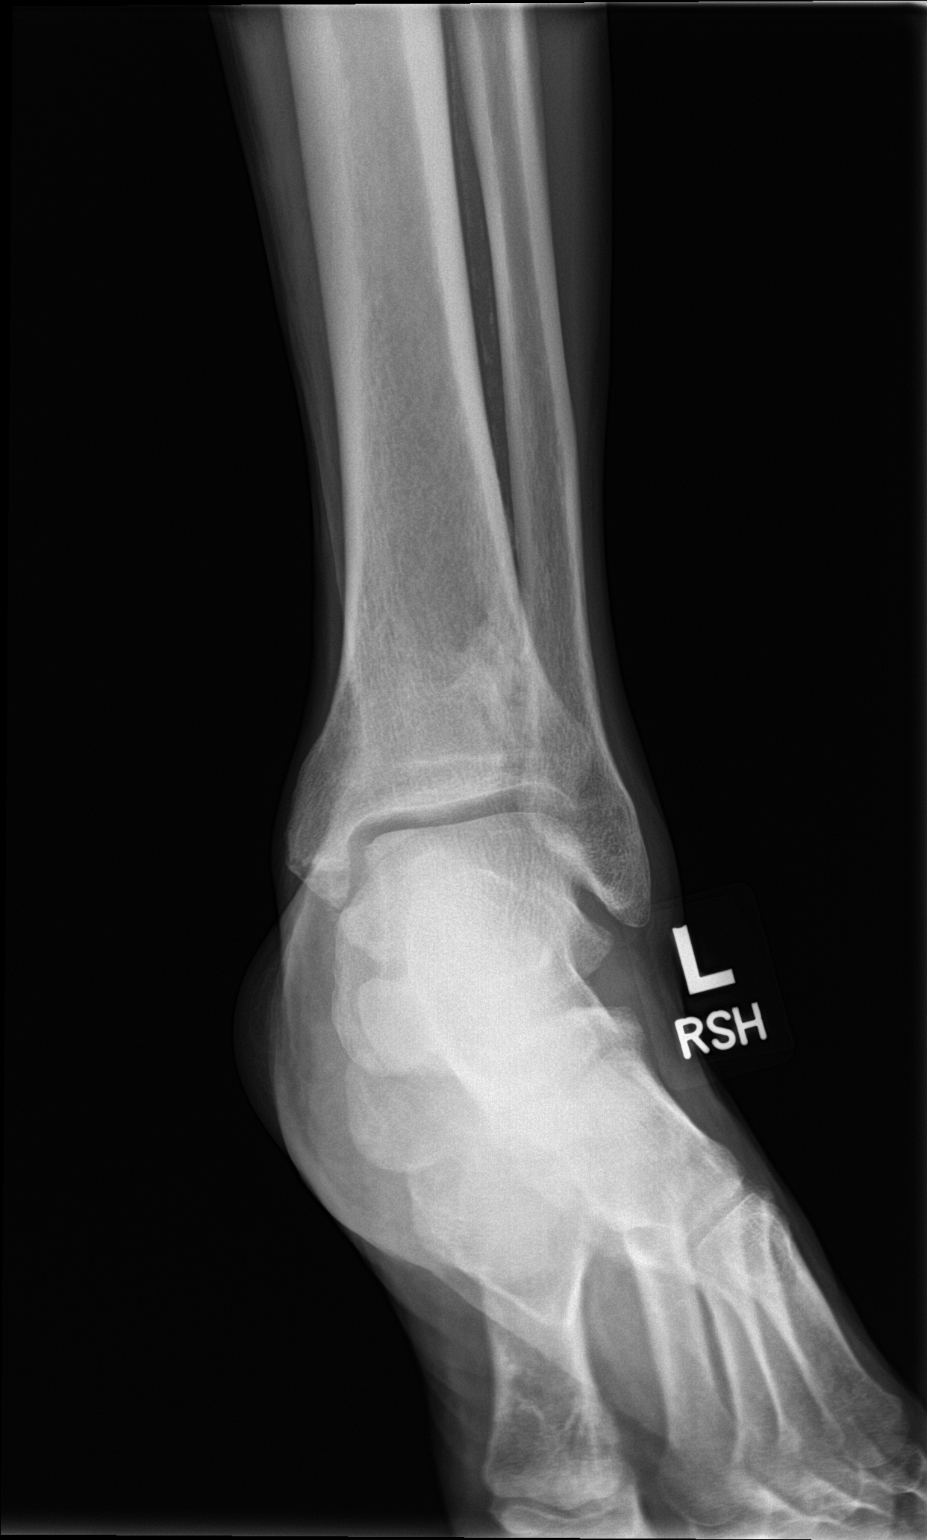

[ankle obl]
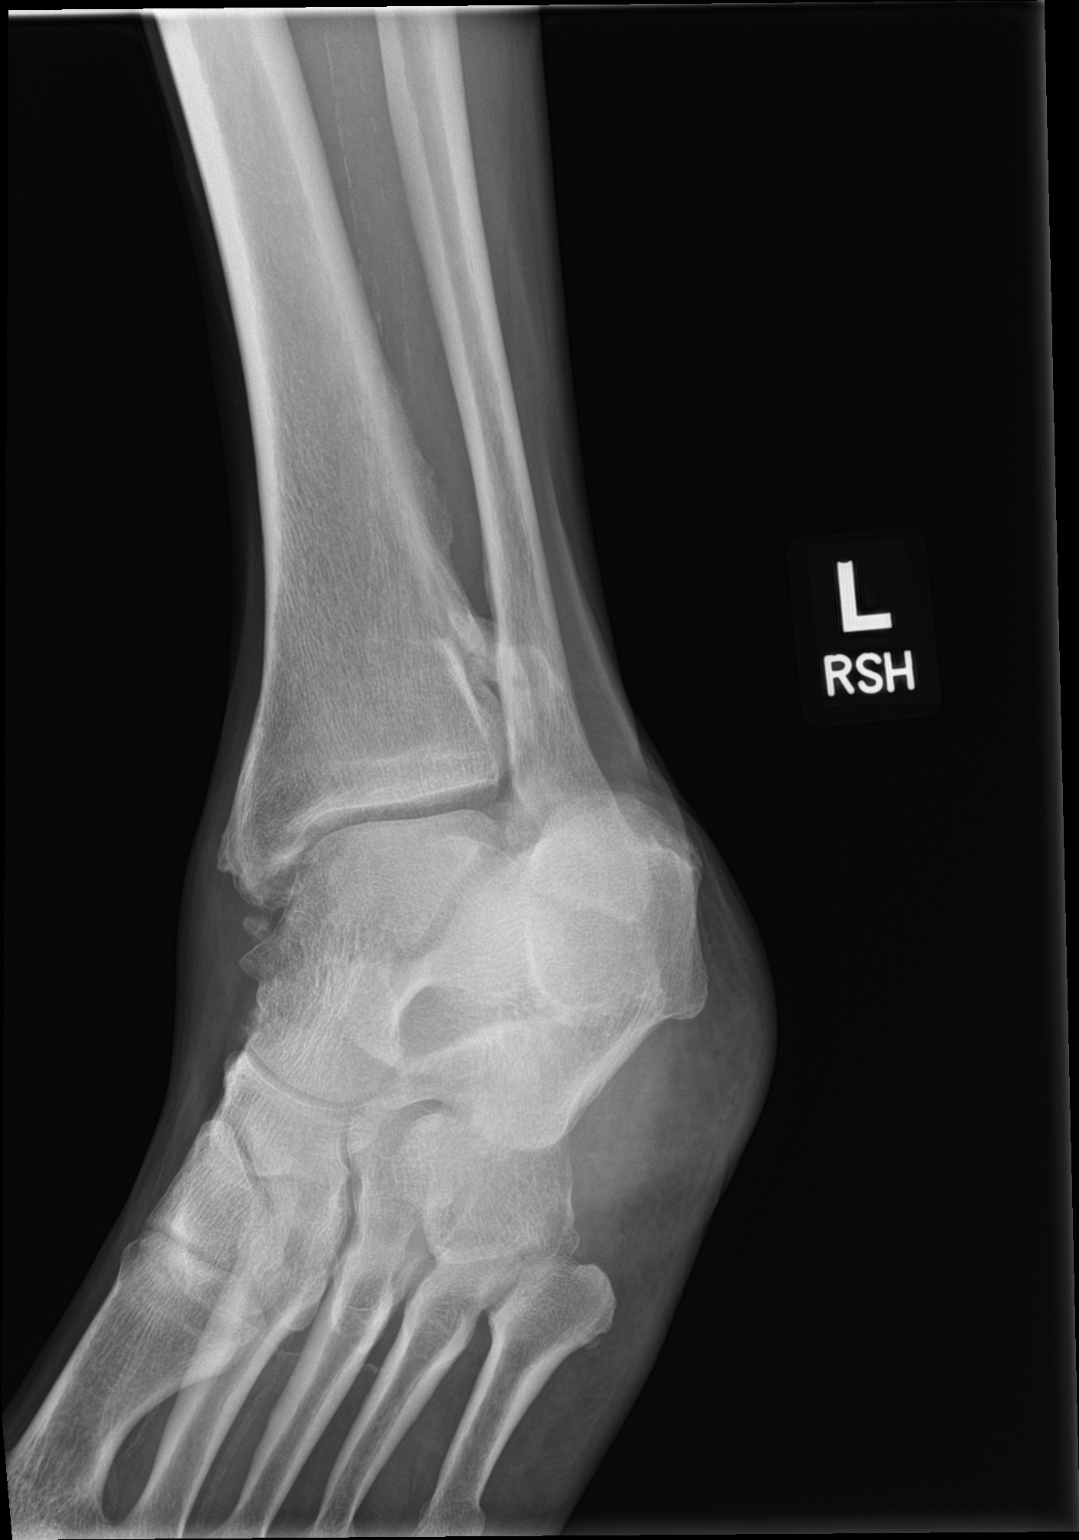

[ankle lat]
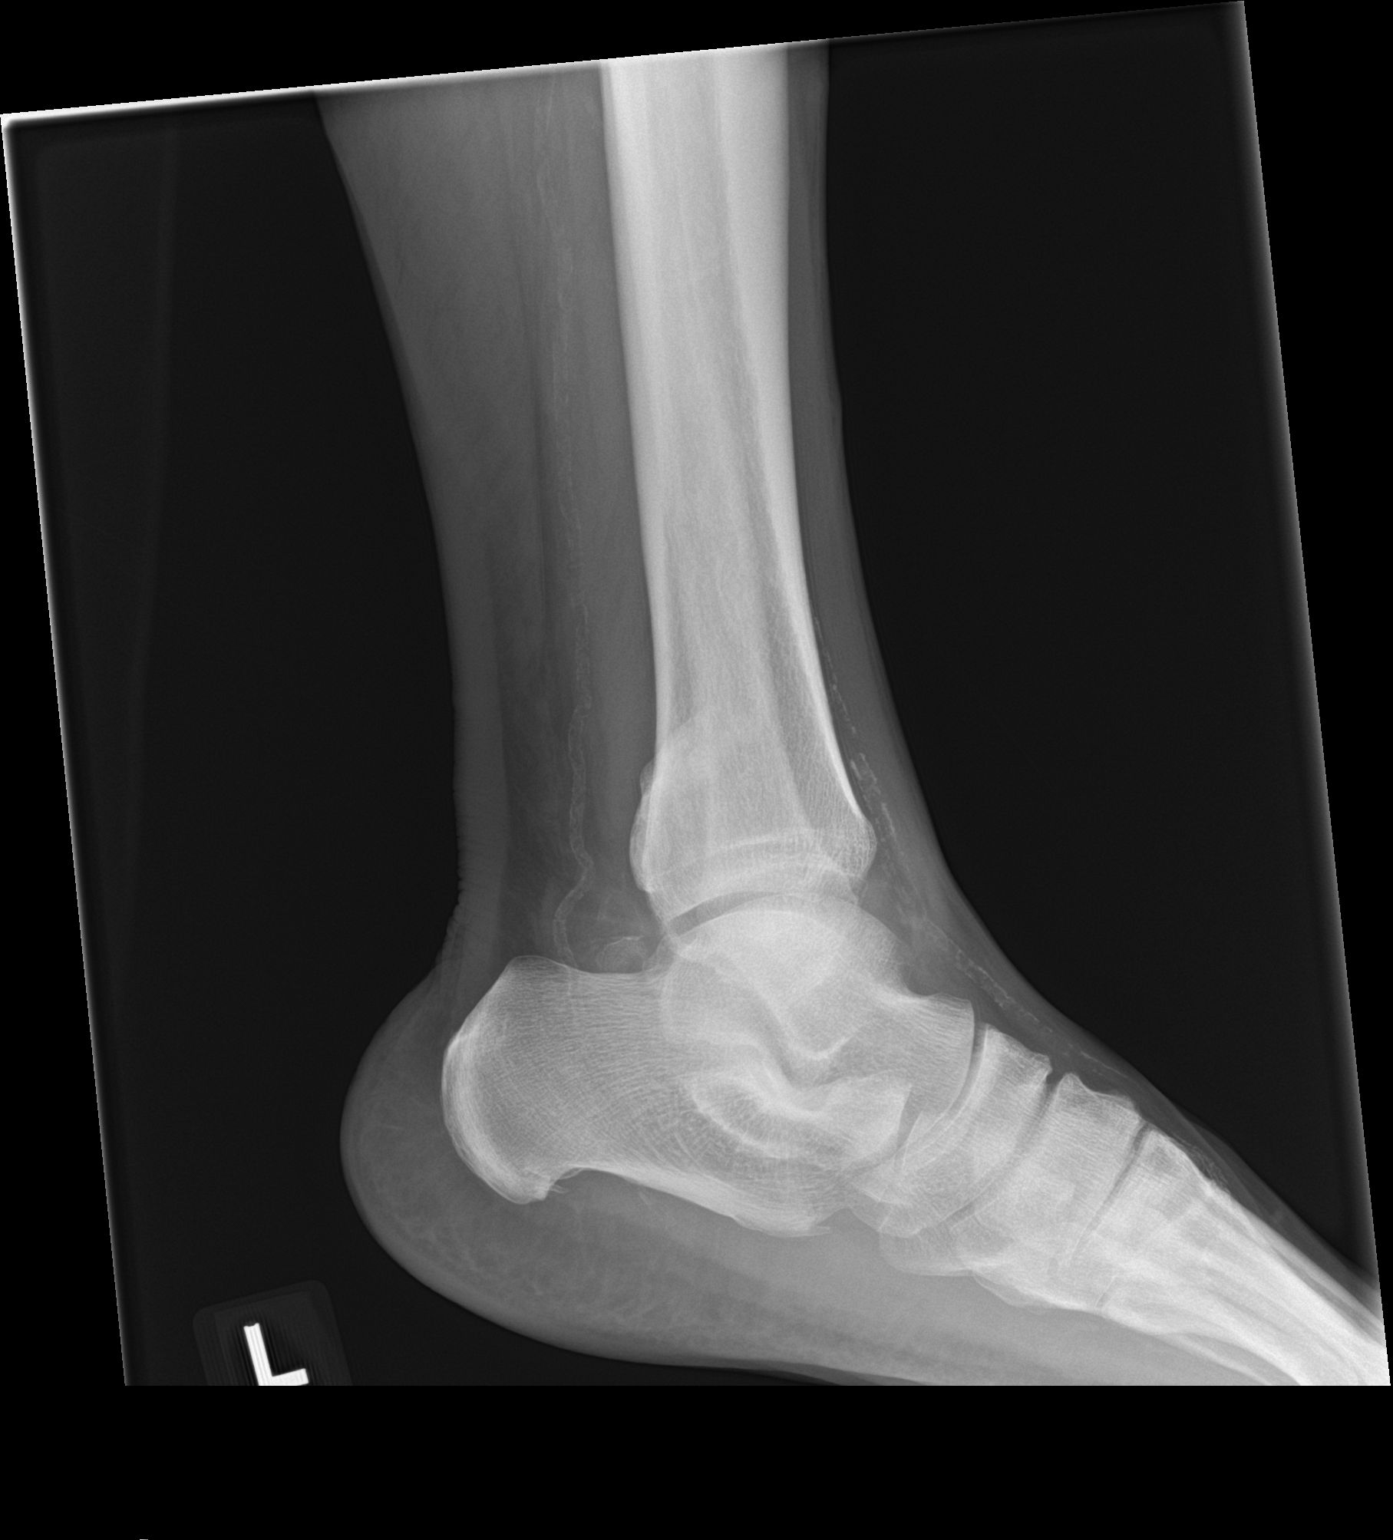

[3 of 3 positions shown; findings below may reference images not displayed]

FINDINGS: Chronic posttraumatic deformity involving the medial malleolus and
distal tibiofibular joint, similar in appearance to prior. No
evidence of acute fracture. Tibiotalar joint space is preserved with
degenerative spurring within the medial gutter. No dislocation. Pes
planus alignment. No focal soft tissue swelling. Vascular
calcifications are noted.
IMPRESSION: 1. No acute osseous abnormality, left ankle.
2. Chronic posttraumatic deformity involving the medial malleolus
and distal tibiofibular joint.

## 2020-08-05 IMAGING — CT CT CTA ABD/PEL W/CM AND/OR W/O CM
2 of 6 series · 15 of 46 positions shown, 17 images · IV contrast (APPLIED)
Comparison: None.

CLINICAL DATA: Abdominal pain with nausea vomiting, concern for
dissection

EXAM:
CT ANGIOGRAPHY ABDOMEN AND PELVIS WITH CONTRAST AND WITHOUT CONTRAST
TECHNIQUE: Multidetector CT imaging of the abdomen and pelvis was performed
using the standard protocol during bolus administration of
intravenous contrast. Multiplanar reconstructed images and MIPs were
obtained and reviewed to evaluate the vascular anatomy.
CONTRAST:  100mL OMNIPAQUE IOHEXOL 350 MG/ML SOLN

[Series 5: arterial · axial · arterial · 0.75mm/px · z∈[-800,-348]mm · 12 of 248 slices shown, 14 images]
[im 11/248  soft-tissue]
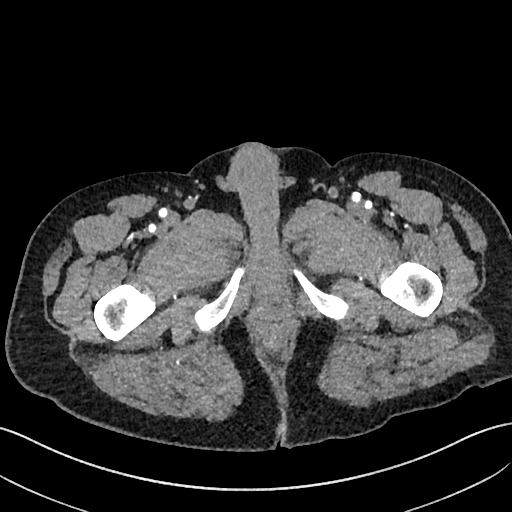
[im 11/248  bone]
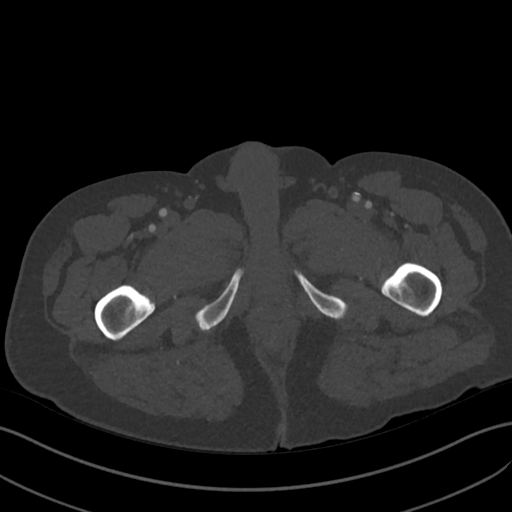
[im 31/248  soft-tissue]
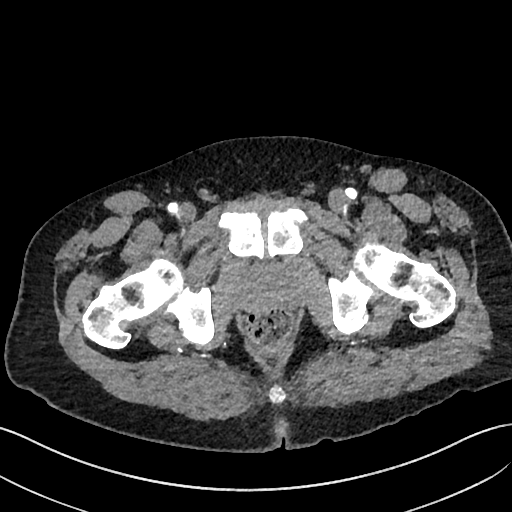
[im 52/248  soft-tissue]
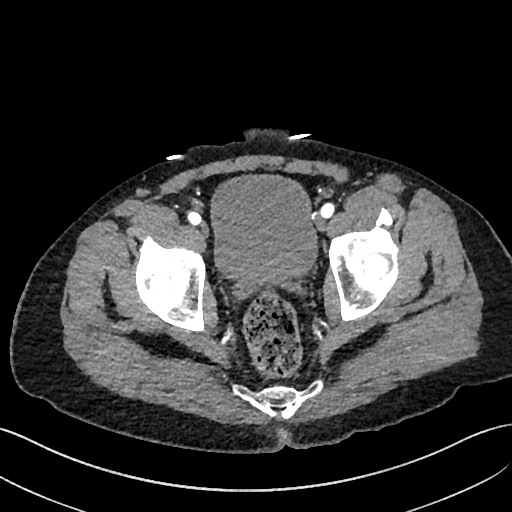
[im 73/248  soft-tissue]
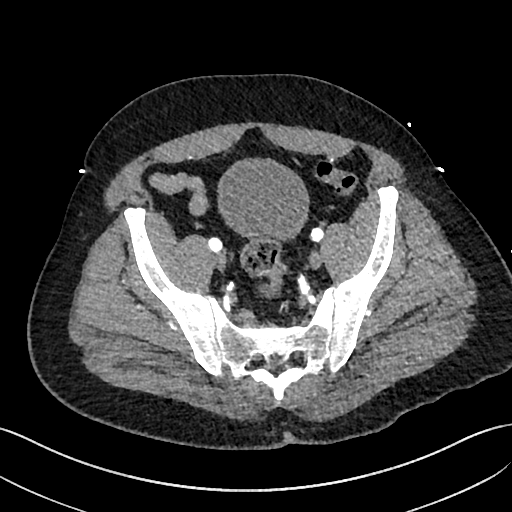
[im 93/248  soft-tissue]
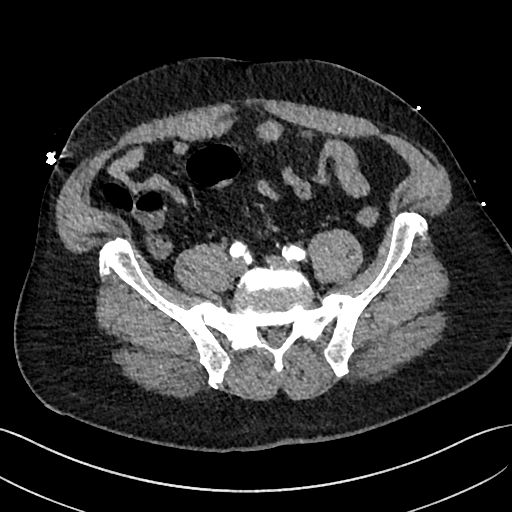
[im 114/248  soft-tissue]
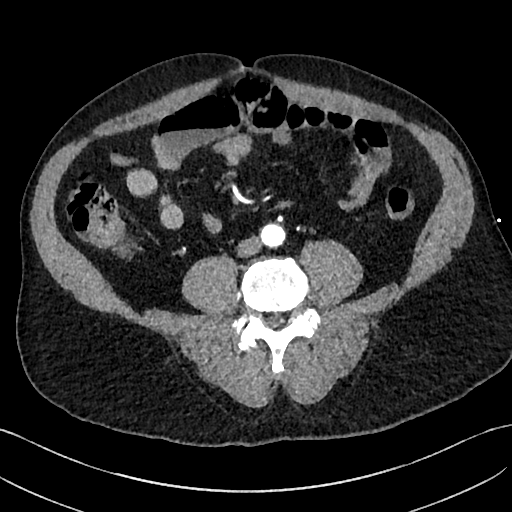
[im 134/248  soft-tissue]
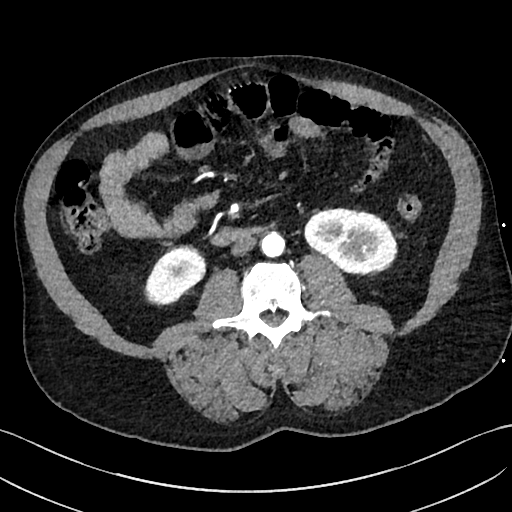
[im 155/248  soft-tissue]
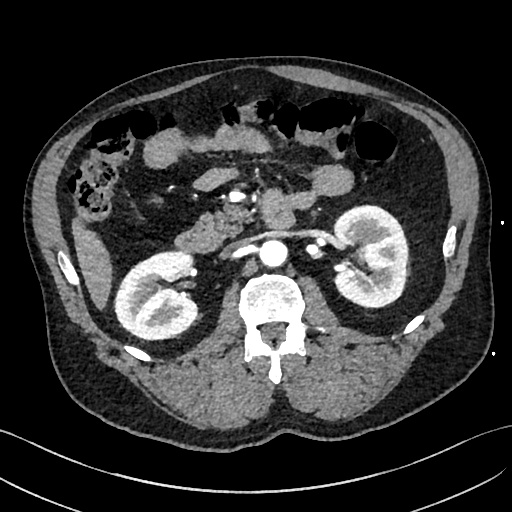
[im 175/248  soft-tissue]
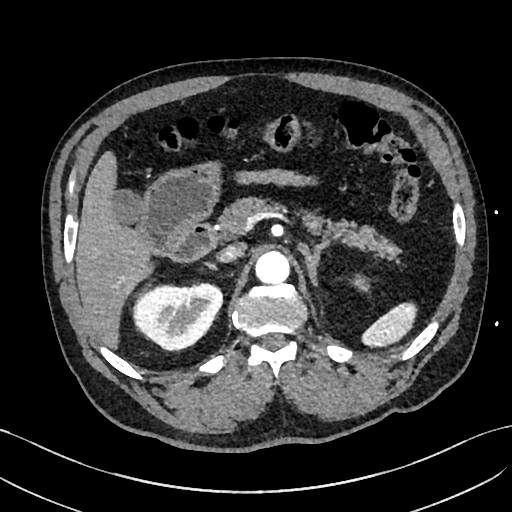
[im 175/248  bone]
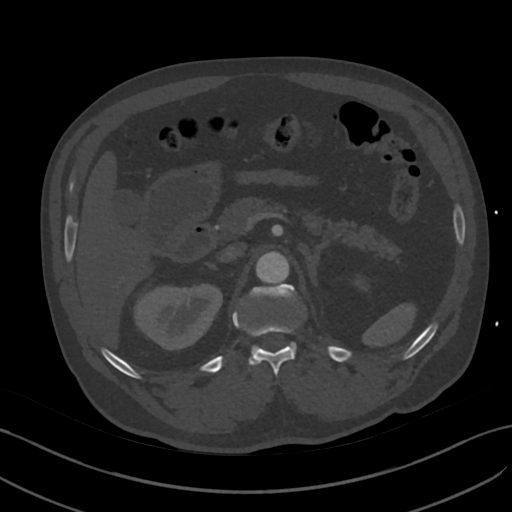
[im 196/248  soft-tissue]
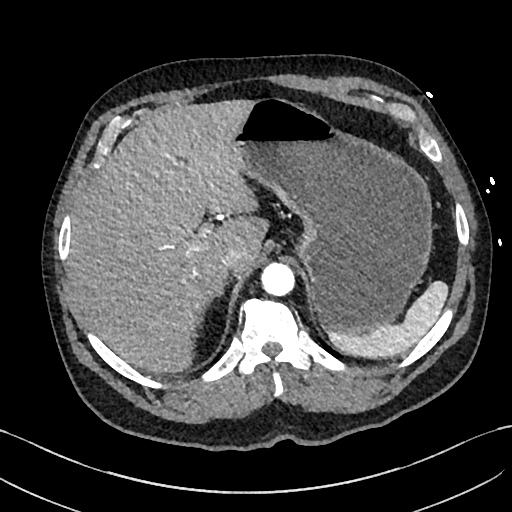
[im 217/248  soft-tissue]
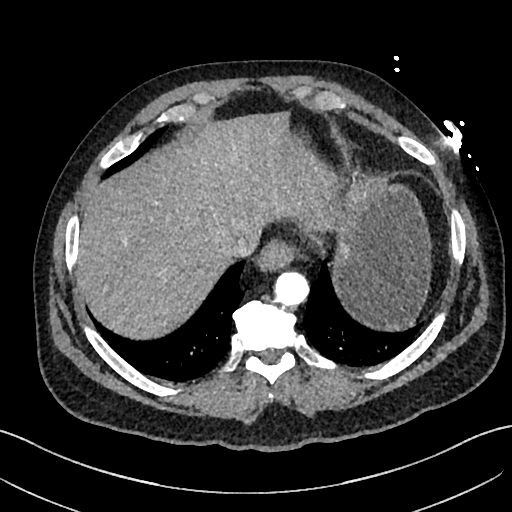
[im 237/248  soft-tissue]
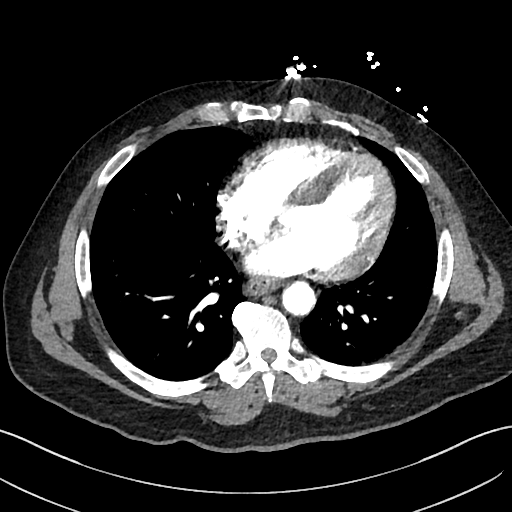

[Series 8: cor · coronal · 0.72mm/px · 3 of 151 slices shown]
[im 38/151  soft-tissue]
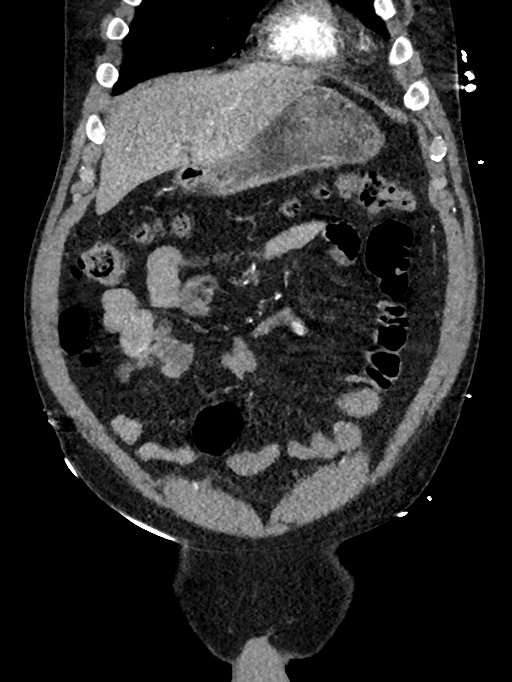
[im 76/151  soft-tissue]
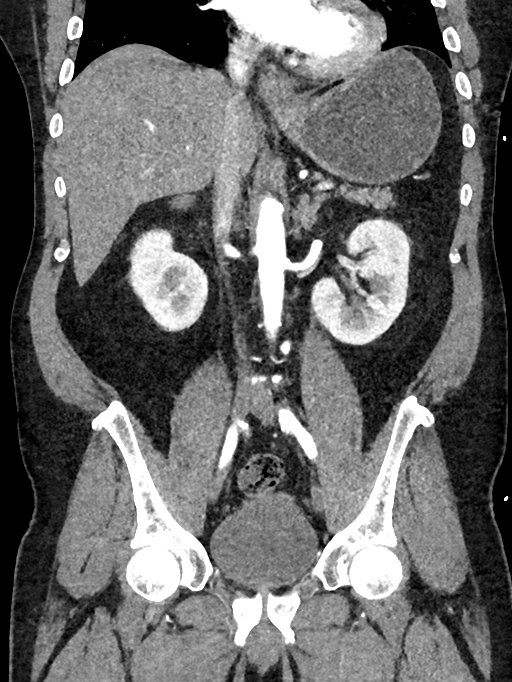
[im 113/151  soft-tissue]
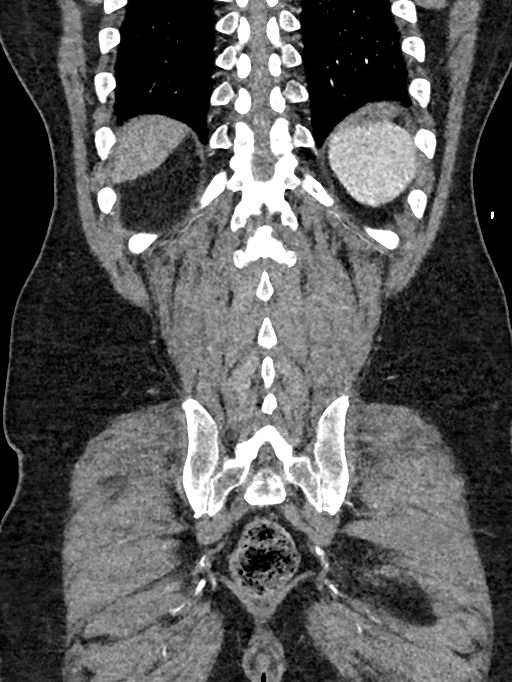

[15 of 46 positions shown; findings below may reference images not displayed]

FINDINGS: VASCULAR

Aorta: Minor aortoiliac atherosclerotic changes without aneurysm,
dissection, evidence of rupture, retroperitoneal hemorrhage or
hematoma, or surrounding inflammatory process. No acute aortic
vascular finding.

Celiac: Atherosclerotic origin but remains patent including its
branches

SMA: Minor atherosclerotic origin but remains patent including its
branches

Renals: widely patent main renal arteries. No accessory renal artery
visualized

IMA: Remains patent off the distal aorta including its branches

Inflow: Atherosclerotic changes of the iliac vasculature without
inflow disease or occlusion. No acute iliac arterial finding.

Proximal Outflow: Atherosclerotic changes of the common femoral,
proximal profunda femoral, and proximal superficial femoral arteries
without acute finding or occlusion.

Veins: Dedicated venous phase imaging not performed.

Review of the MIP images confirms the above findings.

NON-VASCULAR

Lower chest: Slight wall thickening of the distal esophagus,
nonspecific but can be seen with reflux or esophagitis. Normal heart
size. No pericardial or pleural effusion.

Hepatobiliary: No focal liver abnormality is seen. No gallstones,
gallbladder wall thickening, or biliary dilatation.

Pancreas: Unremarkable. No pancreatic ductal dilatation or
surrounding inflammatory changes.

Spleen: Normal in size without focal abnormality.

Adrenals/Urinary Tract: Adrenal glands are unremarkable. Kidneys are
normal, without renal calculi, focal lesion, or hydronephrosis.
Bladder is unremarkable.

Stomach/Bowel: Stomach is within normal limits. Appendix appears
normal. No evidence of bowel wall thickening, distention, or
inflammatory changes.

Lymphatic: No bulky adenopathy

Reproductive: No significant finding by CT

Other: No abdominal wall hernia or abnormality. No abdominopelvic
ascites.

Musculoskeletal: Degenerative changes of the spine and SI joints. No
acute osseous finding.
IMPRESSION: VASCULAR

Aortoiliac atherosclerosis without aneurysm or dissection. No acute
abdominopelvic arterial or other vascular process.

Patent mesenteric and renal vasculature.

Aortic Atherosclerosis ([81]-[81]).

NON-VASCULAR

Nonspecific distal esophageal circumferential wall thickening can be
seen with esophagitis or reflux.

No other acute intra-abdominal or pelvic finding.

## 2020-08-05 MED ORDER — SODIUM CHLORIDE 0.9 % IV BOLUS
500.0000 mL | Freq: Once | INTRAVENOUS | Status: AC
  Administered 2020-08-05: 500 mL via INTRAVENOUS

## 2020-08-05 MED ORDER — ONDANSETRON HCL 4 MG/2ML IJ SOLN
4.0000 mg | Freq: Four times a day (QID) | INTRAMUSCULAR | Status: DC | PRN
  Administered 2020-08-06 (×2): 4 mg via INTRAVENOUS
  Filled 2020-08-05 (×2): qty 2

## 2020-08-05 MED ORDER — LORAZEPAM 2 MG/ML IJ SOLN
1.0000 mg | Freq: Once | INTRAMUSCULAR | Status: AC
  Administered 2020-08-05: 1 mg via INTRAVENOUS
  Filled 2020-08-05: qty 1

## 2020-08-05 MED ORDER — ATORVASTATIN CALCIUM 40 MG PO TABS
40.0000 mg | ORAL_TABLET | Freq: Every day | ORAL | Status: DC
  Administered 2020-08-06 – 2020-08-07 (×2): 40 mg via ORAL
  Filled 2020-08-05 (×2): qty 1

## 2020-08-05 MED ORDER — ONDANSETRON HCL 4 MG PO TABS: 4.0000 mg | ORAL_TABLET | Freq: Four times a day (QID) | ORAL | Status: DC | PRN

## 2020-08-05 MED ORDER — ACETAMINOPHEN 325 MG PO TABS
650.0000 mg | ORAL_TABLET | Freq: Four times a day (QID) | ORAL | Status: DC | PRN
  Administered 2020-08-07: 650 mg via ORAL
  Filled 2020-08-05: qty 2

## 2020-08-05 MED ORDER — TAMSULOSIN HCL 0.4 MG PO CAPS
0.4000 mg | ORAL_CAPSULE | Freq: Every day | ORAL | Status: DC
  Administered 2020-08-06 – 2020-08-07 (×2): 0.4 mg via ORAL
  Filled 2020-08-05 (×3): qty 1

## 2020-08-05 MED ORDER — LABETALOL HCL 5 MG/ML IV SOLN
5.0000 mg | INTRAVENOUS | Status: DC | PRN
  Filled 2020-08-05 (×2): qty 4

## 2020-08-05 MED ORDER — INSULIN ASPART 100 UNIT/ML ~~LOC~~ SOLN: 10.0000 [IU] | Freq: Once | SUBCUTANEOUS | Status: DC

## 2020-08-05 MED ORDER — NICOTINE 7 MG/24HR TD PT24
7.0000 mg | MEDICATED_PATCH | Freq: Every day | TRANSDERMAL | Status: DC | PRN
  Filled 2020-08-05: qty 3

## 2020-08-05 MED ORDER — ONDANSETRON HCL 4 MG/2ML IJ SOLN
4.0000 mg | Freq: Once | INTRAMUSCULAR | Status: AC
  Administered 2020-08-05: 4 mg via INTRAVENOUS
  Filled 2020-08-05 (×2): qty 2

## 2020-08-05 MED ORDER — SODIUM CHLORIDE 0.9 % IV BOLUS
1000.0000 mL | Freq: Once | INTRAVENOUS | Status: AC
  Administered 2020-08-05: 1000 mL via INTRAVENOUS

## 2020-08-05 MED ORDER — INSULIN ASPART 100 UNIT/ML ~~LOC~~ SOLN
0.0000 [IU] | SUBCUTANEOUS | Status: DC
  Administered 2020-08-05: 5 [IU] via SUBCUTANEOUS
  Administered 2020-08-05: 7 [IU] via SUBCUTANEOUS
  Administered 2020-08-06: 3 [IU] via SUBCUTANEOUS
  Administered 2020-08-06: 2 [IU] via SUBCUTANEOUS
  Administered 2020-08-06: 3 [IU] via SUBCUTANEOUS
  Administered 2020-08-06: 1 [IU] via SUBCUTANEOUS
  Administered 2020-08-06: 3 [IU] via SUBCUTANEOUS
  Administered 2020-08-07: 1 [IU] via SUBCUTANEOUS
  Administered 2020-08-07: 2 [IU] via SUBCUTANEOUS
  Administered 2020-08-07: 1 [IU] via SUBCUTANEOUS

## 2020-08-05 MED ORDER — FENTANYL CITRATE (PF) 100 MCG/2ML IJ SOLN
50.0000 ug | INTRAMUSCULAR | Status: DC | PRN
  Administered 2020-08-05 (×2): 50 ug via INTRAVENOUS
  Filled 2020-08-05 (×2): qty 2

## 2020-08-05 MED ORDER — SODIUM CHLORIDE 0.9 % IV SOLN: INTRAVENOUS | Status: DC

## 2020-08-05 MED ORDER — INSULIN GLARGINE 100 UNIT/ML ~~LOC~~ SOLN
10.0000 [IU] | Freq: Every day | SUBCUTANEOUS | Status: DC
  Administered 2020-08-06: 10 [IU] via SUBCUTANEOUS
  Filled 2020-08-05 (×4): qty 0.1

## 2020-08-05 MED ORDER — ACETAMINOPHEN 650 MG RE SUPP: 650.0000 mg | Freq: Four times a day (QID) | RECTAL | Status: DC | PRN

## 2020-08-05 MED ORDER — IOHEXOL 350 MG/ML SOLN
100.0000 mL | Freq: Once | INTRAVENOUS | Status: AC | PRN
  Administered 2020-08-05: 100 mL via INTRAVENOUS

## 2020-08-05 MED ORDER — ENOXAPARIN SODIUM 40 MG/0.4ML ~~LOC~~ SOLN
40.0000 mg | SUBCUTANEOUS | Status: DC
  Administered 2020-08-05 – 2020-08-06 (×2): 40 mg via SUBCUTANEOUS
  Filled 2020-08-05 (×2): qty 0.4

## 2020-08-05 MED ORDER — METOCLOPRAMIDE HCL 5 MG/ML IJ SOLN
5.0000 mg | Freq: Four times a day (QID) | INTRAMUSCULAR | Status: DC
  Administered 2020-08-05 – 2020-08-07 (×5): 5 mg via INTRAVENOUS
  Filled 2020-08-05 (×6): qty 2

## 2020-08-05 MED ORDER — LISINOPRIL 20 MG PO TABS
20.0000 mg | ORAL_TABLET | Freq: Every day | ORAL | Status: DC
  Administered 2020-08-06 – 2020-08-07 (×2): 20 mg via ORAL
  Filled 2020-08-05 (×2): qty 1

## 2020-08-05 NOTE — ED Notes (Signed)
Pt was in the waiting room yesterday and left prior to being seen

## 2020-08-05 NOTE — ED Notes (Signed)
Pt vomitted 300cc bile colored emesis

## 2020-08-05 NOTE — H&P (Signed)
History and Physical    Ryan Hughes SWH:[id number] DOB: April 05, 1966 DOA: 08/05/2020  PCP: Patient, No Pcp Per (Inactive)  Patient coming from: Home  I have personally briefly reviewed patient's old medical records in [place]  Chief Complaint: N/V  HPI: Ryan Hughes is a 55 y.o. male with medical history significant of DM2, HTN, smokes Marijuana.  No h/o THC hyperemesis syndrome in past.  No h/o diabetic gastroparesis in past nor DKA.  Pt presents to ED with c/o several days of severe N/V and abd pain.  Symptoms are constant, severe.  Unable to keep down PO meds at home.  Nothing makes symptoms better or worse.  No SOB, Dysuria.   ED Course: CT abd/pelvis neg for acute findings other than possible esophagitis.  Keytones in urine, BGL 357, AG 15, bicarb 30.  Lactate 1.9.  BPs running 665+ systolic.  EDP concerned about borderline DKA.  Pt hydrated with 3L in ED over 9h, still unable to tolerate POs.   Review of Systems: As per HPI, otherwise all review of systems negative.  Past Medical History:  Diagnosis Date  . Diabetes mellitus without complication ([place])   . Nicotine dependence, cigarettes, uncomplicated 99/35/7017    History reviewed. No pertinent surgical history.   reports that he has been smoking cigarettes. He has been smoking about 0.50 packs per day. He has never used smokeless tobacco. He reports current alcohol use. He reports current drug use. Drug: Marijuana.  No Known Allergies  Family History  Problem Relation Age of Onset  . Stroke Mother      Prior to Admission medications   Medication Sig Start Date End Date Taking? Authorizing Provider  atorvastatin (LIPITOR) 40 MG tablet Take 1 tablet (40 mg total) by mouth daily. 05/21/20  Yes [name], NP  blood glucose meter kit and supplies KIT Dispense based on patient and insurance preference. Use up to four times daily as directed. (FOR ICD-9 250.00, 250.01). 04/27/20  Yes Dwyane Dee, MD  glipiZIDE (GLUCOTROL) 10 MG tablet Take 1 tablet (10 mg total) by mouth 2 (two) times daily before a meal. 05/21/20 05/21/21 Yes [name], NP  insulin glargine (LANTUS) 100 UNIT/ML Solostar Pen Inject 20 Units into the skin daily. 05/21/20  Yes [name], NP  insulin lispro (HUMALOG KWIKPEN) 100 UNIT/ML KwikPen Inject 2-15 Units into the skin 4 (four) times daily -  before meals and at bedtime. Glucose 121 - 150: 2 units, Glucose 151 - 200: 3 units, Glucose 201 - 250: 5 units, Glucose 251 - 300: 8 units, Glucose 301 - 350: 11 units, Glucose 351 - 400: 15 units, Glucose > 400 then call MD 07/01/20  Yes Newlin, Enobong, MD  Insulin Pen Needle (INSUPEN PEN NEEDLES) 31G X 5 MM MISC 1 each by Does not apply route in the morning, at noon, in the evening, and at bedtime. 05/21/20  Yes [name], NP  lisinopril (ZESTRIL) 20 MG tablet Take 1 tablet (20 mg total) by mouth daily. 05/21/20 05/21/21 Yes [name], NP  metFORMIN (GLUCOPHAGE) 1000 MG tablet Take 1 tablet (1,000 mg total) by mouth 2 (two) times daily with a meal. 05/21/20  Yes [name], NP  tamsulosin (FLOMAX) 0.4 MG CAPS capsule Take 1 capsule (0.4 mg total) by mouth daily after breakfast. 05/21/20  Yes [name], NP  TRUE METRIX BLOOD GLUCOSE TEST test strip SMARTSIG:Via Meter 1 to 4 Times Daily 04/27/20  Yes [provider]  TRUEplus Lancets 28G MISC SMARTSIG:Topical 1 to 4 Times Daily 04/27/20  Yes [provider]    Physical Exam: Vitals:   08/05/20 1600 08/05/20 1852 08/05/20 1935 08/05/20 2030  BP: (!) 182/110 (!) 180/117 (!) 176/106 (!) 181/105  Pulse: (!) 112 (!) 115 (!) 114 (!) 119  Resp: (!) 24 17 (!) 23 (!) 23  Temp:  98.2 F (36.8 C)    TempSrc:  Oral    SpO2: 94% 98% 94% 94%  Weight:      Height:        Constitutional: NAD, calm, comfortable Eyes: PERRL, lids and conjunctivae normal ENMT: Mucous membranes are moist. Posterior pharynx clear of  any exudate or lesions.Normal dentition.  Neck: normal, supple, no masses, no thyromegaly Respiratory: clear to auscultation bilaterally, no wheezing, no crackles. Normal respiratory effort. No accessory muscle use.  Cardiovascular: Regular rate and rhythm, no murmurs / rubs / gallops. No extremity edema. 2+ pedal pulses. No carotid bruits.  Abdomen: no tenderness, no masses palpated. No hepatosplenomegaly. Bowel sounds positive.  Musculoskeletal: no clubbing / cyanosis. No joint deformity upper and lower extremities. Good ROM, no contractures. Normal muscle tone.  Skin: no rashes, lesions, ulcers. No induration Neurologic: CN 2-12 grossly intact. Sensation intact, DTR normal. Strength 5/5 in all 4.  Psychiatric: Normal judgment and insight. Alert and oriented x 3. Normal mood.    Labs on Admission: I have personally reviewed following labs and imaging studies  CBC: Recent Labs  Lab 08/05/20 1125  WBC 11.5*  HGB 17.6*  HCT 49.6  MCV 87.9  PLT 518   Basic Metabolic Panel: Recent Labs  Lab 08/05/20 1326  NA 139  K 3.6  CL 94*  CO2 30  GLUCOSE 357*  BUN 15  CREATININE 0.95  CALCIUM 9.7   GFR: Estimated Creatinine Clearance: 99.4 mL/min (by C-G formula based on SCr of 0.95 mg/dL). Liver Function Tests: Recent Labs  Lab 08/05/20 1326  AST 11*  ALT 14  ALKPHOS 90  BILITOT 1.0  PROT 7.5  ALBUMIN 3.8   Recent Labs  Lab 08/05/20 1326  LIPASE 24   No results for input(s): AMMONIA in the last 168 hours. Coagulation Profile: No results for input(s): INR, PROTIME in the last 168 hours. Cardiac Enzymes: No results for input(s): CKTOTAL, CKMB, CKMBINDEX, TROPONINI in the last 168 hours. BNP (last 3 results) No results for input(s): PROBNP in the last 8760 hours. HbA1C: No results for input(s): HGBA1C in the last 72 hours. CBG: Recent Labs  Lab 08/05/20 1922 08/05/20 2132  GLUCAP 289* 268*   Lipid Profile: No results for input(s): CHOL, HDL, LDLCALC, TRIG,  CHOLHDL, LDLDIRECT in the last 72 hours. Thyroid Function Tests: No results for input(s): TSH, T4TOTAL, FREET4, T3FREE, THYROIDAB in the last 72 hours. Anemia Panel: No results for input(s): VITAMINB12, FOLATE, FERRITIN, TIBC, IRON, RETICCTPCT in the last 72 hours. Urine analysis:    Component Value Date/Time   COLORURINE YELLOW 08/05/2020 1445   APPEARANCEUR CLEAR 08/05/2020 1445   LABSPEC 1.028 08/05/2020 1445   PHURINE 6.0 08/05/2020 1445   GLUCOSEU >=500 (A) 08/05/2020 1445   HGBUR SMALL (A) 08/05/2020 1445   BILIRUBINUR NEGATIVE 08/05/2020 1445   KETONESUR 80 (A) 08/05/2020 1445   PROTEINUR >=300 (A) 08/05/2020 1445   UROBILINOGEN 0.2 07/04/2010 0959   NITRITE NEGATIVE 08/05/2020 1445   LEUKOCYTESUR NEGATIVE 08/05/2020 1445    Radiological Exams on Admission: DG Ankle Complete Left  Result Date: 08/05/2020 CLINICAL DATA:  Left ankle pain and  swelling.  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 No evidence of acute fracture. Tibiotalar joint space is preserved with degenerative spurring within the medial gutter. No dislocation. Pes planus alignment. No focal soft tissue swelling. Vascular calcifications are noted. IMPRESSION: 1. No acute osseous abnormality, left ankle. 2. 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 Electronically Signed   By: Davina Poke D.O.   On: 08/05/2020 13:10   CT Angio Abd/Pel W and/or Wo Contrast  Result Date: 08/05/2020 CLINICAL DATA:  Abdominal pain with nausea vomiting, concern for dissection EXAM: CT ANGIOGRAPHY ABDOMEN AND PELVIS WITH CONTRAST AND WITHOUT CONTRAST TECHNIQUE: Multidetector CT imaging of the abdomen and pelvis was performed using the standard protocol during bolus administration of intravenous contrast.  Multiplanar reconstructed images and MIPs were obtained and reviewed to evaluate the vascular anatomy. CONTRAST:  154m OMNIPAQUE IOHEXOL 350 MG/ML SOLN COMPARISON:  None. FINDINGS: VASCULAR Aorta: Minor aortoiliac atherosclerotic changes without aneurysm, dissection, evidence of rupture, retroperitoneal hemorrhage or hematoma, or surrounding inflammatory process. No acute aortic vascular finding. Celiac: Atherosclerotic origin but remains patent including its branches SMA: Minor atherosclerotic origin but remains patent including its branches Renals: widely patent main renal arteries. No accessory renal artery visualized IMA: Remains patent off the distal aorta including its branches Inflow: Atherosclerotic changes of the iliac vasculature without inflow disease or occlusion. No acute iliac arterial finding. Proximal Outflow: Atherosclerotic changes of the common femoral, proximal profunda femoral, and proximal superficial femoral arteries without acute finding or occlusion. Veins: Dedicated venous phase imaging not performed. Review of the MIP images confirms the above findings. NON-VASCULAR Lower chest: Slight wall thickening of the distal esophagus, nonspecific but can be seen with reflux or esophagitis. Normal heart size. No pericardial or pleural effusion. Hepatobiliary: No focal liver abnormality is seen. No gallstones, gallbladder wall thickening, or biliary dilatation. Pancreas: Unremarkable. No pancreatic ductal dilatation or surrounding inflammatory changes. Spleen: Normal in size without focal abnormality. Adrenals/Urinary Tract: Adrenal glands are unremarkable. Kidneys are normal, without renal calculi, focal lesion, or hydronephrosis. Bladder is unremarkable. Stomach/Bowel: Stomach is within normal limits. Appendix appears normal. No evidence of bowel wall thickening, distention, or inflammatory changes. Lymphatic: No bulky adenopathy Reproductive: No significant finding by CT Other: No abdominal wall  hernia or abnormality. No abdominopelvic ascites. Musculoskeletal: Degenerative changes of the spine and SI joints. No acute osseous finding. IMPRESSION: VASCULAR Aortoiliac atherosclerosis without aneurysm or dissection. No acute abdominopelvic arterial or other vascular process. Patent mesenteric and renal vasculature. Aortic Atherosclerosis (ICD10-I70.0). NON-VASCULAR Nonspecific distal esophageal circumferential wall thickening can be seen with esophagitis or reflux. No other acute intra-abdominal or pelvic finding. Electronically Signed   By: MJerilynn Mages  Shick M.D.   On: 08/05/2020 15:12    EKG: Independently reviewed.  Assessment/Plan Principal Problem:   Intractable nausea and vomiting Active Problems:   Diabetes mellitus ([place]    1. Intractable N/V - unable to tolerate POs 1. IVF: NS at 125 2. Add scheduled reglan 3. PRN zofran 4. Try to advance to clear liquid diet when able 5. Fentanyl PRN 2. DM2 - 1. Technically borderline and not formally DKA at this point with AG of 15 2. Though 80 keytones in urine is concerning 3. Check BHB 4. IVF: 3L bolus in ED + 125 cc/hr NS 5. Check VBG 6. Hold home PO hypoglycemics 7. Lantus 10u daily (half  home dose) 8. SSI sensitive Q4H 9. Repeat BMP in AM 3. H/o Cocaine+ UDS in 2010 and 2012 1. Repeat UDS today 2. Admits to [facility] use, didn't ask about cocaine though. 4. HTN - 1. PRN labetalol 2. Resume home lisinopril and flomax in AM  DVT prophylaxis: Lovenox Code Status: Full Family Communication: No family in room Disposition Plan: Home after admit Consults called: None Admission status: Place in 75   Ahnaf Caponi, [place] Hospitalists  How to contact the [facility] Attending or Consulting provider [place] or covering provider during after hours [place], for this patient?  1. Check the care team in [facility] and look for a) attending/consulting [facility] provider listed and b) the [facility] team listed 2. Log into [email or web address]  Amion Physician Scheduling  and messaging for groups and whole hospitals  On call and physician scheduling software for group practices, residents, hospitalists and other medical providers for call, clinic, rotation and shift schedules. OnCall [employer] is a hospital-wide system for scheduling doctors and paging doctors on call. EasyPlot is for scientific plotting and data analysis.  [email or web address]  and use [place]'s universal password to access. If you do not have the password, please contact the hospital operator.  3. Locate the [facility] provider you are looking for under [facility] and page to a number that you can be directly reached. 4. If you still have difficulty reaching the provider, please page the [facility] (Director on Call) for the Hospitalists listed on amion for assistance.  08/05/2020, 9:38 PM

## 2020-08-05 NOTE — ED Notes (Signed)
Returned form xray  

## 2020-08-05 NOTE — ED Triage Notes (Signed)
From home with c/o n/v for past 4 days and c/o pain from gout in left foot, poor caloric intake, hypertensive, hyperglycemic, noncomplient with meds

## 2020-08-05 NOTE — ED Notes (Signed)
Pt actively vomited, afterwards the patient stated he wanted to go home, he could be sick and miserable at home. The pt was given a new emesis bag and reconnected to the monitor.

## 2020-08-05 NOTE — ED Notes (Signed)
Patient transported to CT 

## 2020-08-05 NOTE — ED Provider Notes (Signed)
Care assumed from Dr. Jodi Mourning.  At time of transfer of care, patient is awaiting reassessment after CT imaging has been completed.  Patient has been having several days of intense nausea, vomiting, abdominal pain.  Patient has history of elevated blood pressures and diabetes although he denies history of DKA.  Patient reports he does not marijuana every day but is never had any marijuana induced hyperemesis or other hyperemesis syndromes.  Labs returned and he is borderline DKA with ketones in his urine, glucose of 357, and anion gap of 15.  He does not have creatinine elevation or LFT elevation.  Lactic acid not elevated.  Lipase not elevated, no pancreatitis seen.  CT scan returned without any significant findings.  He was told about the calcification seen but no evidence of aneurysm or dissection.  X-ray of the ankle shows no acute abnormality.  Patient has been here for over 9 hours and I reassessed him.  He is still having significant nausea, vomiting, abdominal discomfort, he is tachycardic in the 120s to 130s and is still very hypertensive.  He reports he has been here, he is not able to take his home medicines and he is still not tolerating p.o. enough to take oral medicines.  I suspect patient is borderline DKA and may have a component of either viral gastroenteritis versus hyperemesis syndrome.  I discussed the options and given his vital signs, continued symptoms, intolerance of p.o., I do feel he needs admission.  Will order some more nausea medicine, some insulin, and will call for admission.  Clinical Impression: Nausea and vomiting  Disposition: Admit  This note was prepared with assistance of Dragon voice recognition software. Occasional wrong-word or sound-a-like substitutions may have occurred due to the inherent limitations of voice recognition software.    CRITICAL CARE Performed by: Canary Brim Treylen Gibbs Total critical care time: 35 minutes Critical care time was exclusive  of separately billable procedures and treating other patients. Critical care was necessary to treat or prevent imminent or life-threatening deterioration. Critical care was time spent personally by me on the following activities: development of treatment plan with patient and/or surrogate as well as nursing, discussions with consultants, evaluation of patient's response to treatment, examination of patient, obtaining history from patient or surrogate, ordering and performing treatments and interventions, ordering and review of laboratory studies, ordering and review of radiographic studies, pulse oximetry and re-evaluation of patient's condition.    Kerin Kren, Canary Brim, MD 08/06/20 0005

## 2020-08-05 NOTE — ED Notes (Signed)
Urine set to lab

## 2020-08-05 NOTE — ED Notes (Signed)
vomitted approx 300cc bile colored emesis

## 2020-08-06 ENCOUNTER — Encounter (HOSPITAL_COMMUNITY): Payer: Self-pay | Admitting: Internal Medicine

## 2020-08-06 DIAGNOSIS — E1165 Type 2 diabetes mellitus with hyperglycemia: Secondary | ICD-10-CM

## 2020-08-06 DIAGNOSIS — E860 Dehydration: Secondary | ICD-10-CM

## 2020-08-06 LAB — GLUCOSE, CAPILLARY
Glucose-Capillary: 132 mg/dL — ABNORMAL HIGH (ref 70–99)
Glucose-Capillary: 181 mg/dL — ABNORMAL HIGH (ref 70–99)
Glucose-Capillary: 203 mg/dL — ABNORMAL HIGH (ref 70–99)
Glucose-Capillary: 231 mg/dL — ABNORMAL HIGH (ref 70–99)
Glucose-Capillary: 245 mg/dL — ABNORMAL HIGH (ref 70–99)
Glucose-Capillary: 263 mg/dL — ABNORMAL HIGH (ref 70–99)

## 2020-08-06 LAB — BASIC METABOLIC PANEL
Anion gap: 9 (ref 5–15)
BUN: 14 mg/dL (ref 6–20)
CO2: 29 mmol/L (ref 22–32)
Calcium: 9.3 mg/dL (ref 8.9–10.3)
Chloride: 104 mmol/L (ref 98–111)
Creatinine, Ser: 0.76 mg/dL (ref 0.61–1.24)
GFR, Estimated: >60 mL/min (ref >60)
Glucose, Bld: 223 mg/dL — ABNORMAL HIGH (ref 70–99)
Potassium: 3.6 mmol/L (ref 3.5–5.1)
Sodium: 142 mmol/L (ref 135–145)

## 2020-08-06 LAB — BLOOD GAS, VENOUS
Acid-Base Excess: 7.8 mmol/L — ABNORMAL HIGH (ref 0.0–2.0)
Bicarbonate: 32 mmol/L — ABNORMAL HIGH (ref 20.0–28.0)
Drawn by: 1390
FIO2: 21
O2 Saturation: 91 %
Patient temperature: 37
pCO2, Ven: 46.8 mmHg (ref 44.0–60.0)
pH, Ven: 7.45 — ABNORMAL HIGH (ref 7.250–7.430)
pO2, Ven: 58.4 mmHg — ABNORMAL HIGH (ref 32.0–45.0)

## 2020-08-06 MED ORDER — INSULIN GLARGINE 100 UNIT/ML ~~LOC~~ SOLN
15.0000 [IU] | Freq: Every day | SUBCUTANEOUS | Status: DC
  Administered 2020-08-07: 15 [IU] via SUBCUTANEOUS
  Filled 2020-08-06: qty 0.15

## 2020-08-06 MED ORDER — PANTOPRAZOLE SODIUM 40 MG IV SOLR
40.0000 mg | Freq: Two times a day (BID) | INTRAVENOUS | Status: DC
  Administered 2020-08-06 – 2020-08-07 (×3): 40 mg via INTRAVENOUS
  Filled 2020-08-06 (×3): qty 40

## 2020-08-06 MED ORDER — KETOROLAC TROMETHAMINE 30 MG/ML IJ SOLN
30.0000 mg | Freq: Four times a day (QID) | INTRAMUSCULAR | Status: DC | PRN
  Administered 2020-08-06: 30 mg via INTRAVENOUS
  Filled 2020-08-06: qty 1

## 2020-08-06 MED ORDER — LABETALOL HCL 5 MG/ML IV SOLN
10.0000 mg | INTRAVENOUS | Status: DC | PRN
  Administered 2020-08-06: 10 mg via INTRAVENOUS

## 2020-08-06 NOTE — Progress Notes (Signed)
This RN called the ED on two different occasion to see if anyone had find Ryan Hughes crutches that he had came into the hospital with. Unfortunately no one has located them.   This RN will pass it along to the oncoming nurse so they can keep checking for patients crutches.

## 2020-08-06 NOTE — Progress Notes (Signed)
Inpatient Diabetes Program Recommendations  AACE/ADA: New Consensus Statement on Inpatient Glycemic Control (2015)  Target Ranges:  Prepandial:   less than 140 mg/dL      Peak postprandial:   less than 180 mg/dL (1-2 hours)      Critically ill patients:  140 - 180 mg/dL   Lab Results  Component Value Date   GLUCAP 203 (H) 08/06/2020   HGBA1C 9.8 (H) 08/05/2020    Review of Glycemic Control  Diabetes history: DM 2 Outpatient Diabetes medications: Lantus 20 units, Humalog 2-15 units tid, Glipizide 10 mg bid, Metformin 1000 mg bid Current orders for Inpatient glycemic control:  Lantus 10 units Daily Novolog 0-9 units Q4 hours  Spoke with pt at bedside regarding glucose control and A1c of 9.8%. Pt stated he was trying to get his "head right" feeling dizzy. Pt reports seeing his PCP for DM management. He can't remember the last time he had an appt. Encouraged glucose monitoring at home and to call PCP office if glucose consistently >200.  Thanks,  Christena Deem RN, MSN, BC-ADM Inpatient Diabetes Coordinator Team Pager [phone] (8a-5p)

## 2020-08-06 NOTE — Progress Notes (Deleted)
[name] to be D/C'd  per MD order. Discussed with the patient and all questions fully answered.  VSS, Skin clean, dry and intact without evidence of skin break down, no evidence of skin tears noted.  IV catheter discontinued intact. Site without signs and symptoms of complications. Dressing and pressure applied.  An After Visit Summary was printed and given to the patient. Patient received prescription.  D/c education completed with patient/family including follow up instructions, medication list, d/c activities limitations if indicated, with other d/c instructions as indicated by MD - patient able to verbalize understanding, all questions fully answered.   Patient instructed to return to ED, call 911, or call MD for any changes in condition.   Patient to be escorted via WC, and D/C hotel via private auto.

## 2020-08-06 NOTE — Plan of Care (Signed)
  Problem: Education: Goal: Knowledge of General Education information will improve Description Including pain rating scale, medication(s)/side effects and non-pharmacologic comfort measures Outcome: Progressing   

## 2020-08-06 NOTE — Progress Notes (Signed)
PROGRESS NOTE  Steven Basso VCB:[id number] DOB: 06-16-1965 DOA: 08/05/2020 PCP: Grayce Sessions, NP   LOS: 0 days   Brief narrative: Patient is a 55 years old male with past medical history of hypertension, diabetes mellitus, history of marijuana use wert hospital with nausea vomiting abdominal pain and was unable to keep things down.  In the ED, patient had a CT scan of the abdomen pelvis which was negative for acute findings but possible esophagitis.  Patient was noted to have a blood glucose level of 347 with mild anion gap and ketones in urine.  Lactate was 1.9.  Blood pressure is elevated as well.  ED provider was concerned about possible borderline DKA.  Patient received 3 L of normal saline in the ED and was still unable to tolerate p.o. so was admitted to hospital for further evaluation and treatment.  Assessment/Plan:  Principal Problem:   Intractable nausea and vomiting Active Problems:   Diabetes mellitus (HCC)   HTN (hypertension)  Intractable nausea vomiting abdominal pain could be secondary to diabetic gastroparesis.  Could not rule out cannabis hyperemesis syndrome.  Continue IV fluids antiemetics gradually advance diet as tolerated.  Patient was tried on liquid but was unable to tolerate much.  Scan of the abdomen without any acute findings.  COVID-19 and influenza was negative.  Diabetes mellitus type 2 with hyperglycemia.  Anion gap is slightly elevated at 15.  Hyperglycemic on prednisone.  Received 3 L of IV fluid bolus.  Continue to hold home medication.  Continue Lantus sliding scale insulin Accu-Cheks frequently.  Adjust POC glucose of 2 3.  Cannabis use.  History of cocaine abuse in the past.  Urine drug screen this time was positive for THC.  Counseling was done  Hypertensive urgency.  Continue as needed labetalol.  Patient takes lisinopril and Flomax at home  Left ankle pain.  History of gout as per the patient.  Patient is currently not tolerating p.o.  Will  add Toradol IV and IV Protonix.  DVT prophylaxis: enoxaparin (LOVENOX) injection 40 mg Start: 08/05/20 2200   Code Status: Full code  Family Communication: None  Status is: Observation  The patient will require care spanning > 2 midnights and should be moved to inpatient because: IV treatments appropriate due to intensity of illness or inability to take PO and Inpatient level of care appropriate due to severity of illness  Dispo: The patient is from: Home              Anticipated d/c is to: Home              Patient currently is not medically stable to d/c.  Patient is still unable to tolerate p.o.   Difficult to place patient No   Consultants:  None  Procedures:  None  Anti-infectives:  . None  Anti-infectives (From admission, onward)   None     Subjective: Today, patient was seen and examined at bedside.  Nursing staff reported and patient was nauseated and had vomiting 1 time this morning.  Has not been able to tolerate much orally.  Also complains of left ankle pain and has history of gout.  Denies any bowel movements  Objective: Vitals:   08/06/20 0854 08/06/20 1303  BP: (!) 166/100 (!) 162/88  Pulse: 92 90  Resp: 16 15  Temp: 98.3 F (36.8 C) 98.5 F (36.9 C)  SpO2: 98% 97%    Intake/Output Summary (Last 24 hours) at 08/06/2020 1354 Last data filed at 08/06/2020 1328  Gross per 24 hour  Intake 2760 ml  Output 850 ml  Net 1910 ml   Filed Weights   08/05/20 1113  Weight: 104.3 kg   Body mass index is 37.12 kg/m.   Physical Exam: GENERAL: Patient is alert awake and oriented. Not in obvious distress.  Obese HENT: No scleral pallor or icterus. Pupils equally reactive to light. Oral mucosa is mildly dry. NECK: is supple, no gross swelling noted. CHEST: Clear to auscultation. No crackles or wheezes.  Diminished breath sounds bilaterally. CVS: S1 and S2 heard, no murmur. Regular rate and rhythm.  ABDOMEN: Soft, non-tender, bowel sounds are  present. EXTREMITIES: No edema.  Left ankle mild swelling and tenderness but no erythema. CNS: Cranial nerves are intact. No focal motor deficits. SKIN: warm and dry without rashes.  Data Review: I have personally reviewed the following laboratory data and studies,  CBC: Recent Labs  Lab 08/05/20 1125 08/05/20 2139  WBC 11.5*  --   HGB 17.6* 16.7  HCT 49.6 49.0  MCV 87.9  --   PLT 363  --    Basic Metabolic Panel: Recent Labs  Lab 08/05/20 1326 08/05/20 2139 08/06/20 0050  NA 139 143 142  K 3.6 3.6 3.6  CL 94*  --  104  CO2 30  --  29  GLUCOSE 357*  --  223*  BUN 15  --  14  CREATININE 0.95  --  0.76  CALCIUM 9.7  --  9.3   Liver Function Tests: Recent Labs  Lab 08/05/20 1326  AST 11*  ALT 14  ALKPHOS 90  BILITOT 1.0  PROT 7.5  ALBUMIN 3.8   Recent Labs  Lab 08/05/20 1326  LIPASE 24   No results for input(s): AMMONIA in the last 168 hours. Cardiac Enzymes: No results for input(s): CKTOTAL, CKMB, CKMBINDEX, TROPONINI in the last 168 hours. BNP (last 3 results) No results for input(s): BNP in the last 8760 hours.  ProBNP (last 3 results) No results for input(s): PROBNP in the last 8760 hours.  CBG: Recent Labs  Lab 08/05/20 2323 08/06/20 0016 08/06/20 0443 08/06/20 0757 08/06/20 1149  GLUCAP 322* 263* 231* 245* 203*   Recent Results (from the past 240 hour(s))  Resp Panel by RT-PCR (Flu A&B, Covid) Nasopharyngeal Swab     Status: None   Collection Time: 08/05/20 10:32 PM   Specimen: Nasopharyngeal Swab; Nasopharyngeal(NP) swabs in vial transport medium  Result Value Ref Range Status   SARS Coronavirus 2 by RT PCR NEGATIVE NEGATIVE Final    Comment: (NOTE) SARS-CoV-2 target nucleic acids are NOT DETECTED.  The SARS-CoV-2 RNA is generally detectable in upper respiratory specimens during the acute phase of infection. The lowest concentration of SARS-CoV-2 viral copies this assay can detect is 138 copies/mL. A negative result does not preclude  SARS-Cov-2 infection and should not be used as the sole basis for treatment or other patient management decisions. A negative result may occur with  improper specimen collection/handling, submission of specimen other than nasopharyngeal swab, presence of viral mutation(s) within the areas targeted by this assay, and inadequate number of viral copies(<138 copies/mL). A negative result must be combined with clinical observations, patient history, and epidemiological information. The expected result is Negative.  Fact Sheet for Patients:  BloggerCourse.com  Fact Sheet for Healthcare Providers:  SeriousBroker.it  This test is no t yet approved or cleared by the [place] FDA and  has been authorized for detection and/or diagnosis of SARS-CoV-2 by FDA under an Emergency  Use Authorization (EUA). This EUA will remain  in effect (meaning this test can be used) for the duration of the COVID-19 declaration under Section 564(b)(1) of the Act, 21 U.S.C.section 360bbb-3(b)(1), unless the authorization is terminated  or revoked sooner.       Influenza A by PCR NEGATIVE NEGATIVE Final   Influenza B by PCR NEGATIVE NEGATIVE Final    Comment: (NOTE) The Xpert Xpress SARS-CoV-2/FLU/RSV plus assay is intended as an aid in the diagnosis of influenza from Nasopharyngeal swab specimens and should not be used as a sole basis for treatment. Nasal washings and aspirates are unacceptable for Xpert Xpress SARS-CoV-2/FLU/RSV testing.  Fact Sheet for Patients: BloggerCourse.com  Fact Sheet for Healthcare Providers: SeriousBroker.it  This test is not yet approved or cleared by the [place] FDA and has been authorized for detection and/or diagnosis of SARS-CoV-2 by FDA under an Emergency Use Authorization (EUA). This EUA will remain in effect (meaning this test can be used) for the duration of  the COVID-19 declaration under Section 564(b)(1) of the Act, 21 U.S.C. section 360bbb-3(b)(1), unless the authorization is terminated or revoked.  Performed at [facility], [address] [place], [place] [address]      Studies: DG Ankle Complete Left  Result Date: 08/05/2020 CLINICAL DATA:  Left ankle pain and swelling.  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 No evidence of acute fracture. Tibiotalar joint space is preserved with degenerative spurring within the medial gutter. No dislocation. Pes planus alignment. No focal soft tissue swelling. Vascular calcifications are noted. IMPRESSION: 1. No acute osseous abnormality, left ankle. 2. 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 Electronically Signed   By: Duanne Guess D.O.   On: 08/05/2020 13:10   CT Angio Abd/Pel W and/or Wo Contrast  Result Date: 08/05/2020 CLINICAL DATA:  Abdominal pain with nausea vomiting, concern for dissection EXAM: CT ANGIOGRAPHY ABDOMEN AND PELVIS WITH CONTRAST AND WITHOUT CONTRAST TECHNIQUE: Multidetector CT imaging of the abdomen and pelvis was performed using the standard protocol during bolus administration of intravenous contrast. Multiplanar reconstructed images and MIPs were obtained and reviewed to evaluate the vascular anatomy. CONTRAST:  OMNIPAQUE IOHEXOL 350 MG/ML SOLN COMPARISON:  None. FINDINGS: VASCULAR Aorta: Minor aortoiliac atherosclerotic changes without aneurysm, dissection, evidence of rupture, retroperitoneal hemorrhage or hematoma, or surrounding inflammatory process. No acute aortic vascular finding. Celiac: Atherosclerotic origin but remains patent including its branches SMA: Minor atherosclerotic origin but remains patent including its branches Renals:  widely patent main renal arteries. No accessory renal artery visualized IMA: Remains patent off the distal aorta including its branches Inflow: Atherosclerotic changes of the iliac vasculature without inflow disease or occlusion. No acute iliac arterial finding. Proximal Outflow: Atherosclerotic changes of the common femoral, proximal profunda femoral, and proximal superficial femoral arteries without acute finding or occlusion. Veins: Dedicated venous phase imaging not performed. Review of the MIP images confirms the above findings. NON-VASCULAR Lower chest: Slight wall thickening of the distal esophagus, nonspecific but can be seen with reflux or esophagitis. Normal heart size. No pericardial or pleural effusion. Hepatobiliary: No focal liver abnormality is seen. No gallstones, gallbladder wall thickening, or biliary dilatation. Pancreas: Unremarkable. No pancreatic ductal dilatation or surrounding inflammatory changes. Spleen: Normal in size without focal abnormality. Adrenals/Urinary Tract: Adrenal glands are unremarkable. Kidneys are normal, without renal calculi, focal lesion,  or hydronephrosis. Bladder is unremarkable. Stomach/Bowel: Stomach is within normal limits. Appendix appears normal. No evidence of bowel wall thickening, distention, or inflammatory changes. Lymphatic: No bulky adenopathy Reproductive: No significant finding by CT Other: No abdominal wall hernia or abnormality. No abdominopelvic ascites. Musculoskeletal: Degenerative changes of the spine and SI joints. No acute osseous finding. IMPRESSION: VASCULAR Aortoiliac atherosclerosis without aneurysm or dissection. No acute abdominopelvic arterial or other vascular process. Patent mesenteric and renal vasculature. Aortic Atherosclerosis (ICD10-I70.0). NON-VASCULAR Nonspecific distal esophageal circumferential wall thickening can be seen with esophagitis or reflux. No other acute intra-abdominal or pelvic finding. Electronically Signed   By: Judie PetitM.   Shick M.D.   On: 08/05/2020 15:12      Joycelyn DasLaxman Jalayia Bagheri, MD  [facility] 08/06/2020  If 7PM-7AM, please contact night-coverage

## 2020-08-06 NOTE — Discharge Instructions (Signed)
Dehydration, Adult Dehydration is condition in which there is not enough water or other fluids in the body. This happens when a person loses more fluids than he or she takes in. Important body parts cannot work right without the right amount of fluids. Any loss of fluids from the body can cause dehydration. Dehydration can be mild, worse, or very bad. It should be treated right away to keep it from getting very bad. What are the causes? This condition may be caused by:  Conditions that cause loss of water or other fluids, such as: ? Watery poop (diarrhea). ? Vomiting. ? Sweating a lot. ? Peeing (urinating) a lot.  Not drinking enough fluids, especially when you: ? Are ill. ? Are doing things that take a lot of energy to do.  Other illnesses and conditions, such as fever or infection.  Certain medicines, such as medicines that take extra fluid out of the body (diuretics).  Lack of safe drinking water.  Not being able to get enough water and food. What increases the risk? The following factors may make you more likely to develop this condition:  Having a long-term (chronic) illness that has not been treated the right way, such as: ? Diabetes. ? Heart disease. ? Kidney disease.  Being 65 years of age or older.  Having a disability.  Living in a place that is high above the ground or sea (high in altitude). The thinner, dried air causes more fluid loss.  Doing exercises that put stress on your body for a long time. What are the signs or symptoms? Symptoms of dehydration depend on how bad it is. Mild or worse dehydration  Thirst.  Dry lips or dry mouth.  Feeling dizzy or light-headed, especially when you stand up from sitting.  Muscle cramps.  Your body making: ? Dark pee (urine). Pee may be the color of tea. ? Less pee than normal. ? Less tears than normal.  Headache. Very bad dehydration  Changes in skin. Skin may: ? Be cold to the touch (clammy). ? Be blotchy  or pale. ? Not go back to normal right after you lightly pinch it and let it go.  Little or no tears, pee, or sweat.  Changes in vital signs, such as: ? Fast breathing. ? Low blood pressure. ? Weak pulse. ? Pulse that is more than 100 beats a minute when you are sitting still.  Other changes, such as: ? Feeling very thirsty. ? Eyes that look hollow (sunken). ? Cold hands and feet. ? Being mixed up (confused). ? Being very tired (lethargic) or having trouble waking from sleep. ? Short-term weight loss. ? Loss of consciousness. How is this treated? Treatment for this condition depends on how bad it is. Treatment should start right away. Do not wait until your condition gets very bad. Very bad dehydration is an emergency. You will need to go to a hospital.  Mild or worse dehydration can be treated at home. You may be asked to: ? Drink more fluids. ? Drink an oral rehydration solution (ORS). This drink helps get the right amounts of fluids and salts and minerals in the blood (electrolytes).  Very bad dehydration can be treated: ? With fluids through an IV tube. ? By getting normal levels of salts and minerals in your blood. This is often done by giving salts and minerals through a tube. The tube is passed through your nose and into your stomach. ? By treating the root cause. Follow these instructions at   home: Oral rehydration solution If told by your doctor, drink an ORS:  Make an ORS. Use instructions on the package.  Start by drinking small amounts, about  cup (120 mL) every 5-10 minutes.  Slowly drink more until you have had the amount that your doctor said to have. Eating and drinking  Drink enough clear fluid to keep your pee pale yellow. If you were told to drink an ORS, finish the ORS first. Then, start slowly drinking other clear fluids. Drink fluids such as: ? Water. Do not drink only water. Doing that can make the salt (sodium) level in your body get too low. ? Water  from ice chips you suck on. ? Fruit juice that you have added water to (diluted). ? Low-calorie sports drinks.  Eat foods that have the right amounts of salts and minerals, such as: ? Bananas. ? Oranges. ? Potatoes. ? Tomatoes. ? Spinach.  Do not drink alcohol.  Avoid: ? Drinks that have a lot of sugar. These include:  High-calorie sports drinks.  Fruit juice that you did not add water to.  Soda.  Caffeine. ? Foods that are greasy or have a lot of fat or sugar.         General instructions  Take over-the-counter and prescription medicines only as told by your doctor.  Do not take salt tablets. Doing that can make the salt level in your body get too high.  Return to your normal activities as told by your doctor. Ask your doctor what activities are safe for you.  Keep all follow-up visits as told by your doctor. This is important. Contact a doctor if:  You have pain in your belly (abdomen) and the pain: ? Gets worse. ? Stays in one place.  You have a rash.  You have a stiff neck.  You get angry or annoyed (irritable) more easily than normal.  You are more tired or have a harder time waking than normal.  You feel: ? Weak or dizzy. ? Very thirsty. Get help right away if you have:  Any symptoms of very bad dehydration.  Symptoms of vomiting, such as: ? You cannot eat or drink without vomiting. ? Your vomiting gets worse or does not go away. ? Your vomit has blood or green stuff in it.  Symptoms that get worse with treatment.  A fever.  A very bad headache.  Problems with peeing or pooping (having a bowel movement), such as: ? Watery poop that gets worse or does not go away. ? Blood in your poop (stool). This may cause poop to look black and tarry. ? Not peeing in 6-8 hours. ? Peeing only a small amount of very dark pee in 6-8 hours.  Trouble breathing. These symptoms may be an emergency. Do not wait to see if the symptoms will go away. Get  medical help right away. Call your local emergency services (911 in the [place] Do not drive yourself to the hospital. Summary  Dehydration is a condition in which there is not enough water or other fluids in the body. This happens when a person loses more fluids than he or she takes in.  Treatment for this condition depends on how bad it is. Treatment should be started right away. Do not wait until your condition gets very bad.  Drink enough clear fluid to keep your pee pale yellow. If you were told to drink an oral rehydration solution (ORS), finish the ORS first. Then, start slowly drinking other clear fluids.    Take over-the-counter and prescription medicines only as told by your doctor.  Get help right away if you have any symptoms of very bad dehydration. This information is not intended to replace advice given to you by your health care provider. Make sure you discuss any questions you have with your health care provider. Document Revised: 12/06/2018 Document Reviewed: 12/06/2018 Elsevier Patient Education  2021 [employer]  

## 2020-08-07 ENCOUNTER — Other Ambulatory Visit: Payer: Self-pay | Admitting: Internal Medicine

## 2020-08-07 LAB — BASIC METABOLIC PANEL
Anion gap: 8 (ref 5–15)
BUN: 13 mg/dL (ref 6–20)
CO2: 25 mmol/L (ref 22–32)
Calcium: 8.9 mg/dL (ref 8.9–10.3)
Chloride: 105 mmol/L (ref 98–111)
Creatinine, Ser: 0.82 mg/dL (ref 0.61–1.24)
GFR, Estimated: >60 mL/min (ref >60)
Glucose, Bld: 146 mg/dL — ABNORMAL HIGH (ref 70–99)
Potassium: 3.4 mmol/L — ABNORMAL LOW (ref 3.5–5.1)
Sodium: 138 mmol/L (ref 135–145)

## 2020-08-07 LAB — CBC
HCT: 40.2 % (ref 39.0–52.0)
Hemoglobin: 13.6 g/dL (ref 13.0–17.0)
MCH: 30.4 pg (ref 26.0–34.0)
MCHC: 33.8 g/dL (ref 30.0–36.0)
MCV: 89.9 fL (ref 80.0–100.0)
Platelets: 262 10*3/uL (ref 150–400)
RBC: 4.47 MIL/uL (ref 4.22–5.81)
RDW: 13.1 % (ref 11.5–15.5)
WBC: 9.5 10*3/uL (ref 4.0–10.5)
nRBC: 0 % (ref 0.0–0.2)

## 2020-08-07 LAB — GLUCOSE, CAPILLARY
Glucose-Capillary: 142 mg/dL — ABNORMAL HIGH (ref 70–99)
Glucose-Capillary: 146 mg/dL — ABNORMAL HIGH (ref 70–99)
Glucose-Capillary: 155 mg/dL — ABNORMAL HIGH (ref 70–99)
Glucose-Capillary: 158 mg/dL — ABNORMAL HIGH (ref 70–99)

## 2020-08-07 LAB — PHOSPHORUS: Phosphorus: 3 mg/dL (ref 2.5–4.6)

## 2020-08-07 LAB — MAGNESIUM: Magnesium: 1.8 mg/dL (ref 1.7–2.4)

## 2020-08-07 MED ORDER — NICOTINE 7 MG/24HR TD PT24: 7.0000 mg | MEDICATED_PATCH | Freq: Every day | TRANSDERMAL | 0 refills | Status: DC | PRN

## 2020-08-07 MED ORDER — POTASSIUM CHLORIDE 10 MEQ/100ML IV SOLN
10.0000 meq | INTRAVENOUS | Status: AC
  Administered 2020-08-07 (×2): 10 meq via INTRAVENOUS
  Filled 2020-08-07 (×2): qty 100

## 2020-08-07 MED ORDER — ONDANSETRON HCL 4 MG PO TABS: 4.0000 mg | ORAL_TABLET | Freq: Four times a day (QID) | ORAL | 0 refills | Status: DC | PRN

## 2020-08-07 MED ORDER — METOCLOPRAMIDE HCL 10 MG PO TABS: 10.0000 mg | ORAL_TABLET | Freq: Three times a day (TID) | ORAL | 1 refills | Status: DC | PRN

## 2020-08-07 MED ORDER — PANTOPRAZOLE SODIUM 40 MG PO TBEC: 40.0000 mg | DELAYED_RELEASE_TABLET | Freq: Every day | ORAL | 1 refills | Status: DC

## 2020-08-07 MED FILL — PANTOPRAZOLE SOD DR 40 MG T: 40 | 30 days supply | Qty: 30 | Fill #0

## 2020-08-07 MED FILL — ONDANSETRON HCL 4 MG TABLET: 4 | 5 days supply | Qty: 20 | Fill #0

## 2020-08-07 NOTE — Progress Notes (Signed)
Went over d/c instruction with niece and Aggie Cosier (girlfriend) on the phone. Pt anxious to be discharge. Gave 2 runs of K+ before discharge. IV discontinue. Prescription script given to patient and family.

## 2020-08-07 NOTE — Discharge Summary (Signed)
Physician Discharge Summary  Ryan Hughes JJH:[id number] DOB: Dec 04, 1965 DOA: 08/05/2020  PCP: [name], NP  Admit date: 08/05/2020 Discharge date: 08/07/2020  Admitted From: Home  Discharge disposition: Home   Recommendations for Outpatient Follow-Up:   . Follow up with your primary care provider in one week.  . Check CBC, BMP, magnesium in the next visit  Discharge Diagnosis:   Principal Problem:   Intractable nausea and vomiting Active Problems:   Diabetes mellitus ([place])   HTN (hypertension)   Discharge Condition: Improved.  Diet recommendation: Low sodium, heart healthy.  Carbohydrate-modified.   Wound care: None.  Code status: Full.   History of Present Illness:   Patient is a 55 years old male with past medical history of hypertension, diabetes mellitus, history of marijuana use wert hospital with nausea vomiting abdominal pain and was unable to keep things down.  In the ED, patient had a CT scan of the abdomen pelvis which was negative for acute findings but possible esophagitis.  Patient was noted to have a blood glucose level of 347 with mild anion gap and ketones in urine.  Lactate was 1.9.  Blood pressure is elevated as well.  ED provider was concerned about possible borderline DKA.  Patient received 3 L of normal saline in the ED and was still unable to tolerate p.o. so was admitted to hospital for further evaluation and treatment.   Hospital Course:   Following conditions were addressed during hospitalization as listed below,  Intractable nausea, vomiting abdominal pain could be secondary to diabetic gastroparesis.  Could not rule out cannabis hyperemesis syndrome.   Had improvement with IV fluids antiemetics, he was gradually advanced on diet which he tolerated.  CT scan of abdomen without any acute findings.  COVID-19 and influenza was negative.  Diabetes mellitus type 2 with hyperglycemia.  Anion gap was slightly elevated at 15.  Hyperglycemic  on presentation.  Patient received 3 L of IV fluid bolus.  This has improved at this time.  Patient was counseled the need for being compliant with insulin regimen.  Cannabis use.  History of cocaine abuse in the past.  Urine drug screen this time was positive for THC.  Counseling was done TSE.  Hypertensive urgency.   Patient will resume lisinopril and Flomax at home  Left ankle pain.  Received Toradol IV and IV Protonix.  Disposition.  At this time, patient is stable for disposition home with outpatient PCP follow-up  Medical Consultants:    None.  Procedures:    None Subjective:   Today, patient and examined at bedside.  Denies any nausea, vomiting abdominal pain.  Has tolerated oral diet.  Wishes to go home.  Discharge Exam:   Vitals:   08/06/20 2025 08/07/20 0438  BP: 116/83 (!) 154/86  Pulse: 78 79  Resp: 15 18  Temp: 99.1 F (37.3 C) 98.7 F (37.1 C)  SpO2: 98% 99%   Vitals:   08/06/20 0854 08/06/20 1303 08/06/20 2025 08/07/20 0438  BP: (!) 166/100 (!) 162/88 116/83 (!) 154/86  Pulse: 92 90 78 79  Resp: 16 15 15 18   Temp: 98.3 F (36.8 C) 98.5 F (36.9 C) 99.1 F (37.3 C) 98.7 F (37.1 C)  TempSrc: Oral Axillary Oral Oral  SpO2: 98% 97% 98% 99%  Weight:      Height:       General: Alert awake, not in obvious distress, obese HENT: pupils equally reacting to light,  No scleral pallor or icterus noted. Oral mucosa  is moist.  Chest:  Clear breath sounds.  Diminished breath sounds bilaterally. No crackles or wheezes.  CVS: S1 &S2 heard. No murmur.  Regular rate and rhythm. Abdomen: Soft, nontender, nondistended.  Bowel sounds are heard.   Extremities: No cyanosis, clubbing or edema.  Peripheral pulses are palpable. Psych: Alert, awake and oriented, normal mood CNS:  No cranial nerve deficits.  Power equal in all extremities.   Skin: Warm and dry.  No rashes noted.  The results of significant diagnostics from this hospitalization (including imaging,  microbiology, ancillary and laboratory) are listed below for reference.     Diagnostic Studies:   DG Ankle Complete Left  Result Date: 08/05/2020 CLINICAL DATA:  Left ankle pain and swelling.  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 No evidence of acute fracture. Tibiotalar joint space is preserved with degenerative spurring within the medial gutter. No dislocation. Pes planus alignment. No focal soft tissue swelling. Vascular calcifications are noted. IMPRESSION: 1. No acute osseous abnormality, left ankle. 2. 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 Electronically Signed   By: Davina Poke D.O.   On: 08/05/2020 13:10   CT Angio Abd/Pel W and/or Wo Contrast  Result Date: 08/05/2020 CLINICAL DATA:  Abdominal pain with nausea vomiting, concern for dissection EXAM: CT ANGIOGRAPHY ABDOMEN AND PELVIS WITH CONTRAST AND WITHOUT CONTRAST TECHNIQUE: Multidetector CT imaging of the abdomen and pelvis was performed using the standard protocol during bolus administration of intravenous contrast. Multiplanar reconstructed images and MIPs were obtained and reviewed to evaluate the vascular anatomy. CONTRAST:  15m OMNIPAQUE IOHEXOL 350 MG/ML SOLN COMPARISON:  None. FINDINGS: VASCULAR Aorta: Minor aortoiliac atherosclerotic changes without aneurysm, dissection, evidence of rupture, retroperitoneal hemorrhage or hematoma, or surrounding inflammatory process. No acute aortic vascular finding. Celiac: Atherosclerotic origin but remains patent including its branches SMA: Minor atherosclerotic origin but remains patent including its branches Renals: widely patent main renal arteries. No accessory renal artery visualized IMA: Remains patent off the distal aorta including its branches Inflow:  Atherosclerotic changes of the iliac vasculature without inflow disease or occlusion. No acute iliac arterial finding. Proximal Outflow: Atherosclerotic changes of the common femoral, proximal profunda femoral, and proximal superficial femoral arteries without acute finding or occlusion. Veins: Dedicated venous phase imaging not performed. Review of the MIP images confirms the above findings. NON-VASCULAR Lower chest: Slight wall thickening of the distal esophagus, nonspecific but can be seen with reflux or esophagitis. Normal heart size. No pericardial or pleural effusion. Hepatobiliary: No focal liver abnormality is seen. No gallstones, gallbladder wall thickening, or biliary dilatation. Pancreas: Unremarkable. No pancreatic ductal dilatation or surrounding inflammatory changes. Spleen: Normal in size without focal abnormality. Adrenals/Urinary Tract: Adrenal glands are unremarkable. Kidneys are normal, without renal calculi, focal lesion, or hydronephrosis. Bladder is unremarkable. Stomach/Bowel: Stomach is within normal limits. Appendix appears normal. No evidence of bowel wall thickening, distention, or inflammatory changes. Lymphatic: No bulky adenopathy Reproductive: No significant finding by CT Other: No abdominal wall hernia or abnormality. No abdominopelvic ascites. Musculoskeletal: Degenerative changes of the spine and SI joints. No acute osseous finding. IMPRESSION: VASCULAR Aortoiliac atherosclerosis without aneurysm or dissection. No acute abdominopelvic arterial or other vascular process. Patent mesenteric and renal vasculature. Aortic Atherosclerosis (ICD10-I70.0). NON-VASCULAR Nonspecific distal esophageal circumferential wall thickening can be seen with esophagitis or reflux. No other acute intra-abdominal or pelvic finding. Electronically  Signed   By: Jerilynn Mages.  Shick M.D.   On: 08/05/2020 15:12     Labs:   Basic Metabolic Panel: Recent Labs  Lab 08/05/20 1326 08/05/20 2139 08/06/20 0050  08/07/20 0245  NA 139 143 142 138  K 3.6 3.6 3.6 3.4*  CL 94*  --  104 105  CO2 30  --  29 25  GLUCOSE 357*  --  223* 146*  BUN 15  --  14 13  CREATININE 0.95  --  0.76 0.82  CALCIUM 9.7  --  9.3 8.9  MG  --   --   --  1.8  PHOS  --   --   --  3.0   GFR Estimated Creatinine Clearance: 115.2 mL/min (by C-G formula based on SCr of 0.82 mg/dL). Liver Function Tests: Recent Labs  Lab 08/05/20 1326  AST 11*  ALT 14  ALKPHOS 90  BILITOT 1.0  PROT 7.5  ALBUMIN 3.8   Recent Labs  Lab 08/05/20 1326  LIPASE 24   No results for input(s): AMMONIA in the last 168 hours. Coagulation profile No results for input(s): INR, PROTIME in the last 168 hours.  CBC: Recent Labs  Lab 08/05/20 1125 08/05/20 2139 08/07/20 0245  WBC 11.5*  --  9.5  HGB 17.6* 16.7 13.6  HCT 49.6 49.0 40.2  MCV 87.9  --  89.9  PLT 363  --  262   Cardiac Enzymes: No results for input(s): CKTOTAL, CKMB, CKMBINDEX, TROPONINI in the last 168 hours. BNP: Invalid input(s): POCBNP CBG: Recent Labs  Lab 08/06/20 2015 08/07/20 0016 08/07/20 0435 08/07/20 0758 08/07/20 1206  GLUCAP 132* 155* 146* 142* 158*   D-Dimer No results for input(s): DDIMER in the last 72 hours. Hgb A1c Recent Labs    08/05/20 2111  HGBA1C 9.8*   Lipid Profile No results for input(s): CHOL, HDL, LDLCALC, TRIG, CHOLHDL, LDLDIRECT in the last 72 hours. Thyroid function studies No results for input(s): TSH, T4TOTAL, T3FREE, THYROIDAB in the last 72 hours.  Invalid input(s): FREET3 Anemia work up No results for input(s): VITAMINB12, FOLATE, FERRITIN, TIBC, IRON, RETICCTPCT in the last 72 hours. Microbiology Recent Results (from the past 240 hour(s))  Resp Panel by RT-PCR (Flu A&B, Covid) Nasopharyngeal Swab     Status: None   Collection Time: 08/05/20 10:32 PM   Specimen: Nasopharyngeal Swab; Nasopharyngeal(NP) swabs in vial transport medium  Result Value Ref Range Status   SARS Coronavirus 2 by RT PCR NEGATIVE NEGATIVE  Final    Comment: (NOTE) SARS-CoV-2 target nucleic acids are NOT DETECTED.  The SARS-CoV-2 RNA is generally detectable in upper respiratory specimens during the acute phase of infection. The lowest concentration of SARS-CoV-2 viral copies this assay can detect is 138 copies/mL. A negative result does not preclude SARS-Cov-2 infection and should not be used as the sole basis for treatment or other patient management decisions. A negative result may occur with  improper specimen collection/handling, submission of specimen other than nasopharyngeal swab, presence of viral mutation(s) within the areas targeted by this assay, and inadequate number of viral copies(<138 copies/mL). A negative result must be combined with clinical observations, patient history, and epidemiological information. The expected result is Negative.  Fact Sheet for Patients:  EntrepreneurPulse.com.au  Fact Sheet for Healthcare Providers:  IncredibleEmployment.be  This test is no t yet approved or cleared by the [place] FDA and  has been authorized for detection and/or diagnosis of SARS-CoV-2 by FDA under an Emergency Use Authorization (EUA). This EUA  will remain  in effect (meaning this test can be used) for the duration of the COVID-19 declaration under Section 564(b)(1) of the Act, 21 U.S.C.section 360bbb-3(b)(1), unless the authorization is terminated  or revoked sooner.       Influenza A by PCR NEGATIVE NEGATIVE Final   Influenza B by PCR NEGATIVE NEGATIVE Final    Comment: (NOTE) The Xpert Xpress SARS-CoV-2/FLU/RSV plus assay is intended as an aid in the diagnosis of influenza from Nasopharyngeal swab specimens and should not be used as a sole basis for treatment. Nasal washings and aspirates are unacceptable for Xpert Xpress SARS-CoV-2/FLU/RSV testing.  Fact Sheet for Patients: EntrepreneurPulse.com.au  Fact Sheet for Healthcare  Providers: IncredibleEmployment.be  This test is not yet approved or cleared by the [place] FDA and has been authorized for detection and/or diagnosis of SARS-CoV-2 by FDA under an Emergency Use Authorization (EUA). This EUA will remain in effect (meaning this test can be used) for the duration of the COVID-19 declaration under Section 564(b)(1) of the Act, 21 U.S.C. section 360bbb-3(b)(1), unless the authorization is terminated or revoked.  Performed at [facility], [place] [address] [place], [place] [address]      Discharge Instructions:   Discharge Instructions    Call MD for:  persistant nausea and vomiting   Complete by: As directed    Call MD for:  severe uncontrolled pain   Complete by: As directed    Diet Carb Modified   Complete by: As directed    Discharge instructions   Complete by: As directed    Follow-up with your primary care physician in 1 week.  Small frequent meals.  Avoid high-fiber diet, alcohol, carbonated drinks, fatty fried foods.  Gradually advance your diet.  Stay on full liquid diet today.   Increase activity slowly   Complete by: As directed      Allergies as of 08/07/2020   No Known Allergies     Medication List    TAKE these medications   atorvastatin 40 MG tablet Commonly known as: LIPITOR Take 1 tablet (40 mg total) by mouth daily.   blood glucose meter kit and supplies Kit Dispense based on patient and insurance preference. Use up to four times daily as directed. (FOR ICD-9 250.00, 250.01).   glipiZIDE 10 MG tablet Commonly known as: GLUCOTROL Take 1 tablet (10 mg total) by mouth 2 (two) times daily before a meal.   insulin glargine 100 UNIT/ML Solostar Pen Commonly known as: LANTUS Inject 20 Units into the skin daily.   insulin lispro 100 UNIT/ML KwikPen Commonly known as: HumaLOG KwikPen Inject 2-15 Units into the skin 4 (four) times daily -  before meals and at bedtime. Glucose 121 - 150: 2 units,  Glucose 151 - 200: 3 units, Glucose 201 - 250: 5 units, Glucose 251 - 300: 8 units, Glucose 301 - 350: 11 units, Glucose 351 - 400: 15 units, Glucose > 400 then call MD   Insupen Pen Needles 31G X 5 MM Misc Generic drug: Insulin Pen Needle 1 each by Does not apply route in the morning, at noon, in the evening, and at bedtime.   lisinopril 20 MG tablet Commonly known as: ZESTRIL Take 1 tablet (20 mg total) by mouth daily.   metFORMIN 1000 MG tablet Commonly known as: GLUCOPHAGE Take 1 tablet (1,000 mg total) by mouth 2 (two) times daily with a meal.   metoCLOPramide 10 MG tablet Commonly known as: REGLAN Take 1 tablet (10 mg total) by  mouth every 8 (eight) hours as needed for up to 5 days for refractory nausea / vomiting. If not helped with zofran   nicotine 7 mg/24hr patch Commonly known as: NICODERM CQ - dosed in mg/24 hr Place 1-3 patches (7-21 mg total) onto the skin daily as needed (Nicotine withdrawal).   ondansetron 4 MG tablet Commonly known as: ZOFRAN Take 1 tablet (4 mg total) by mouth every 6 (six) hours as needed for nausea.   pantoprazole 40 MG tablet Commonly known as: Protonix Take 1 tablet (40 mg total) by mouth daily.   tamsulosin 0.4 MG Caps capsule Commonly known as: FLOMAX Take 1 capsule (0.4 mg total) by mouth daily after breakfast.   True Metrix Blood Glucose Test test strip Generic drug: glucose blood SMARTSIG:Via Meter 1 to 4 Times Daily   TRUEplus Lancets 28G Misc SMARTSIG:Topical 1 to 4 Times Daily       Follow-up Information    [name], NP Follow up.   Specialty: Internal Medicine Why: 08/21/19 at 1:30 pm  Contact information: [address] [place] [address] [phone]                Time coordinating discharge: 39 minutes  Signed:  Mylin Gignac  [facility] 08/07/2020, 3:59 PM

## 2020-08-07 NOTE — Care Management (Signed)
Patient has follow up with Dr Gwinda Passe. Information on AVS.   Patient entered in [facility] program again and [facility] pharmacy will bring medications to him prior to discharge.   Ronny Flurry RN

## 2020-08-08 ENCOUNTER — Other Ambulatory Visit (HOSPITAL_COMMUNITY): Payer: Self-pay

## 2020-08-10 ENCOUNTER — Telehealth: Payer: Self-pay

## 2020-08-10 ENCOUNTER — Other Ambulatory Visit: Payer: Self-pay

## 2020-08-10 MED ORDER — METOCLOPRAMIDE HCL 10 MG PO TABS
10.0000 mg | ORAL_TABLET | Freq: Three times a day (TID) | ORAL | 1 refills | Status: DC
  Filled 2020-08-10: qty 15, 5d supply, fill #0

## 2020-08-10 NOTE — Telephone Encounter (Signed)
Transition Care Management Follow-up Telephone Call  Date of discharge and from where:[facility] 08/07/2020 How have you been since you were released from the hospital? Good / checked BG today 159 in the morning.  Any questions or concerns? No questions/concerns reported.  Items Reviewed: Did the pt receive and understand the discharge instructions provided?Pt have the instructions and have no questions at this time.  Medications obtained and verified?Pt have Medication list  and  medications were reviewed and they have no questions.  Any new allergies since your discharge? None reported  Do you have support at home? Yes, wife  Other (ie: DME, Home Health, etc)       Functional Questionnaire: (I = Independent and D = Dependent) ADL's:  Independent.      Follow up appointments reviewed:   PCP Hospital f/u appt confirmed? NP Gwinda Passe on 08/20/2020  Specialist Hospital f/u appt confirmed?  scheduled at this time  Are transportation arrangements needed? have transportation   If their condition worsens, is the pt aware to call  their PCP or go to the ED? Yes.Made pt aware if condition worsen or start experiencing rapid weight gain, chest pain, diff breathing, SOB, high fevers, or bleading to refer imediately to ED for further evaluation.  Was the patient provided with contact information for the PCP's office or ED? He has the phone number  Was the pt encouraged to call back with questions or concerns?yes

## 2020-08-11 ENCOUNTER — Telehealth (INDEPENDENT_AMBULATORY_CARE_PROVIDER_SITE_OTHER): Payer: Self-pay | Admitting: Primary Care

## 2020-08-20 ENCOUNTER — Encounter (INDEPENDENT_AMBULATORY_CARE_PROVIDER_SITE_OTHER): Payer: Self-pay | Admitting: Primary Care

## 2020-08-20 ENCOUNTER — Ambulatory Visit (INDEPENDENT_AMBULATORY_CARE_PROVIDER_SITE_OTHER): Payer: Self-pay | Admitting: Primary Care

## 2020-08-20 ENCOUNTER — Other Ambulatory Visit: Payer: Self-pay

## 2020-08-20 VITALS — BP 135/80 | HR 70 | Temp 97.3°F | Ht 66.0 in | Wt 208.4 lb

## 2020-08-20 DIAGNOSIS — M1A0720 Idiopathic chronic gout, left ankle and foot, without tophus (tophi): Secondary | ICD-10-CM

## 2020-08-20 DIAGNOSIS — Z09 Encounter for follow-up examination after completed treatment for conditions other than malignant neoplasm: Secondary | ICD-10-CM

## 2020-08-20 DIAGNOSIS — E1165 Type 2 diabetes mellitus with hyperglycemia: Secondary | ICD-10-CM

## 2020-08-20 DIAGNOSIS — I10 Essential (primary) hypertension: Secondary | ICD-10-CM

## 2020-08-20 MED ORDER — NAPROXEN 500 MG PO TABS
500.0000 mg | ORAL_TABLET | Freq: Two times a day (BID) | ORAL | 0 refills | Status: DC
  Filled 2020-08-20: qty 60, 30d supply, fill #0

## 2020-08-20 NOTE — Patient Instructions (Addendum)
Gout  Gout is painful swelling of your joints. Gout is a type of arthritis. It is caused by having too much uric acid in your body. Uric acid is a chemical that is made when your body breaks down substances called purines. If your body has too much uric acid, sharp crystals can form and build up in your joints. This causes pain and swelling. Gout attacks can happen quickly and be very painful (acute gout). Over time, the attacks can affect more joints and happen more often (chronic gout). What are the causes?  Too much uric acid in your blood. This can happen because: ? Your kidneys do not remove enough uric acid from your blood. ? Your body makes too much uric acid. ? You eat too many foods that are high in purines. These foods include organ meats, some seafood, and beer.  Trauma or stress. What increases the risk?  Having a family history of gout.  Being male and middle-aged.  Being male and having gone through menopause.  Being very overweight (obese).  Drinking alcohol, especially beer.  Not having enough water in the body (being dehydrated).  Losing weight too quickly.  Having an organ transplant.  Having lead poisoning.  Taking certain medicines.  Having kidney disease.  Having a skin condition called psoriasis. What are the signs or symptoms? An attack of acute gout usually happens in just one joint. The most common place is the big toe. Attacks often start at night. Other joints that may be affected include joints of the feet, ankle, knee, fingers, wrist, or elbow. Symptoms of an attack may include:  Very bad pain.  Warmth.  Swelling.  Stiffness.  Shiny, red, or purple skin.  Tenderness. The affected joint may be very painful to touch.  Chills and fever. Chronic gout may cause symptoms more often. More joints may be involved. You may also have white or yellow lumps (tophi) on your hands or feet or in other areas near your joints.   How is this  treated?  Treatment for this condition has two phases: treating an acute attack and preventing future attacks.  Acute gout treatment may include: ? NSAIDs. ? Steroids. These are taken by mouth or injected into a joint. ? Colchicine. This medicine relieves pain and swelling. It can be given by mouth or through an IV tube.  Preventive treatment may include: ? Taking small doses of NSAIDs or colchicine daily. ? Using a medicine that reduces uric acid levels in your blood. ? Making changes to your diet. You may need to see a food expert (dietitian) about what to eat and drink to prevent gout. Follow these instructions at home: During a gout attack  If told, put ice on the painful area: ? Put ice in a plastic bag. ? Place a towel between your skin and the bag. ? Leave the ice on for 20 minutes, 2-3 times a day.  Raise (elevate) the painful joint above the level of your heart as often as you can.  Rest the joint as much as possible. If the joint is in your leg, you may be given crutches.  Follow instructions from your doctor about what you cannot eat or drink.   Avoiding future gout attacks  Eat a low-purine diet. Avoid foods and drinks such as: ? Liver. ? Kidney. ? Anchovies. ? Asparagus. ? Herring. ? Mushrooms. ? Mussels. ? Beer.  Stay at a healthy weight. If you want to lose weight, talk with your doctor. Do  not lose weight too fast.  Start or continue an exercise plan as told by your doctor. Eating and drinking  Drink enough fluids to keep your pee (urine) pale yellow.  If you drink alcohol: ? Limit how much you use to:  0-1 drink a day for women.  0-2 drinks a day for men. ? Be aware of how much alcohol is in your drink. In the [place] one drink equals one 12 oz bottle of beer (355 mL), one 5 oz glass of wine (148 mL), or one 1 oz glass of hard liquor (44 mL). General instructions  Take over-the-counter and prescription medicines only as told by your doctor.  Do  not drive or use heavy machinery while taking prescription pain medicine.  Return to your normal activities as told by your doctor. Ask your doctor what activities are safe for you.  Keep all follow-up visits as told by your doctor. This is important. Contact a doctor if:  You have another gout attack.  You still have symptoms of a gout attack after 10 days of treatment.  You have problems (side effects) because of your medicines.  You have chills or a fever.  You have burning pain when you pee (urinate).  You have pain in your lower back or belly. Get help right away if:  You have very bad pain.  Your pain cannot be controlled.  You cannot pee. Summary  Gout is painful swelling of the joints.  The most common site of pain is the big toe, but it can affect other joints.  Medicines and avoiding some foods can help to prevent and treat gout attacks. This information is not intended to replace advice given to you by your health care provider. Make sure you discuss any questions you have with your health care provider. Document Revised: 11/15/2017 Document Reviewed: 11/15/2017 Elsevier Patient Education  [address]   Erskine Squibb [phone] resource

## 2020-08-20 NOTE — Progress Notes (Signed)
Hospital follow up : HPI     [name] 55 y.o. obese male presents for follow up from the hospital. Admit date to the hospital was 08/05/20, patient was discharged from the hospital on 08/07/20, patient was admitted for: Diabetes mellitus ([place]) ,HTN (hypertension), Uncontrolled type 2 diabetes mellitus with hyperglycemia, without long-term current use of insulin (HCC) Resolved Intractable nausea and vomiting.  .. Hypertension- Denies shortness of breath, headaches, chest pain or lower extremity edema.  Type 2 diabetes A1c 9.8 uncontrolled-Hypoglycemic episodes:no Polydipsia/polyuria: yes Visual disturbance: no Chest pain: no Paresthesias: yes Glucose Monitoring: no  Accucheck frequency: sometimes  He voices concerns about his left ankle has been hurting has a history of gout-the pain is causing difficulty to walk and having to use a quad cane for stability. Past Medical History:  Diagnosis Date  . Diabetes mellitus without complication ([place])   . Hypertension   . Nicotine dependence, cigarettes, uncomplicated 24/23/5361     No Known Allergies    Current Outpatient Medications on File Prior to Visit  Medication Sig Dispense Refill  . atorvastatin (LIPITOR) 40 MG tablet TAKE 1 TABLET (40 MG TOTAL) BY MOUTH DAILY. 90 tablet 3  . blood glucose meter kit and supplies KIT Dispense based on patient and insurance preference. Use up to four times daily as directed. (FOR ICD-9 250.00, 250.01). 1 each 0  . Blood Glucose Monitoring Suppl (TRUE METRIX METER) w/Device KIT USE UP TO 4 TIMES DAILY AS DIRECTED 1 kit 0  . glipiZIDE (GLUCOTROL) 10 MG tablet TAKE 1 TABLET (10 MG TOTAL) BY MOUTH 2 (TWO) TIMES DAILY BEFORE A MEAL. 180 tablet 1  . glucose blood test strip USE UP TO FOUR TIMES DAILY AS DIRECTED (Patient taking differently: USE UP TO FOUR TIMES DAILY AS DIRECTED) 200 strip 0  . Insulin Glargine (BASAGLAR KWIKPEN) 100 UNIT/ML INJECT 20 UNITS INTO THE SKIN DAILY. 15 mL 11  . insulin glargine  (LANTUS) 100 UNIT/ML Solostar Pen Inject 20 Units into the skin daily. 15 mL 11  . insulin lispro (HUMALOG) 100 UNIT/ML KwikPen INJECT 2-15 UNITS INTO THE SKIN 4 (FOUR) TIMES DAILY - BEFORE MEALS AND AT BEDTIME. 15 mL 2  . Insulin Pen Needle 31G X 5 MM MISC USE AS DIRECTED IN THE MORNING, NOON, EVENING AND BEDTIME 100 each 11  . lisinopril (ZESTRIL) 20 MG tablet TAKE 1 TABLET (20 MG TOTAL) BY MOUTH DAILY. 30 tablet 11  . metFORMIN (GLUCOPHAGE) 1000 MG tablet TAKE 1 TABLET (1,000 MG TOTAL) BY MOUTH 2 (TWO) TIMES DAILY WITH A MEAL. 180 tablet 3  . ondansetron (ZOFRAN) 4 MG tablet Take 1 tablet (4 mg total) by mouth every 6 (six) hours as needed for nausea. 20 tablet 0  . ondansetron (ZOFRAN) 4 MG tablet TAKE 1 TABLET (4 MG TOTAL) BY MOUTH EVERY SIX HOURS AS NEEDED FOR NAUSEA. 20 tablet 0  . pantoprazole (PROTONIX) 40 MG tablet Take 1 tablet (40 mg total) by mouth daily. 30 tablet 1  . pantoprazole (PROTONIX) 40 MG tablet TAKE 1 TABLET (40 MG TOTAL) BY MOUTH DAILY. 30 tablet 1  . sulfamethoxazole-trimethoprim (BACTRIM DS) 800-160 MG tablet TAKE 2 TABLETS BY MOUTH EVERY TWELVE HOURS FOR 3 DAYS. 12 tablet 0  . tamsulosin (FLOMAX) 0.4 MG CAPS capsule TAKE 1 CAPSULE (0.4 MG TOTAL) BY MOUTH DAILY AFTER BREAKFAST. 30 capsule 3  . TRUE METRIX BLOOD GLUCOSE TEST test strip SMARTSIG:Via Meter 1 to 4 Times Daily    . TRUEplus Lancets 28G MISC SMARTSIG:Topical 1  to 4 Times Daily    . TRUEplus Lancets 28G MISC USE UP TO 4 TIMES DAILY AS DIRECTED 200 each 0  . metoCLOPramide (REGLAN) 10 MG tablet Take 1 tablet (10 mg total) by mouth every 8 (eight) hours as needed for up to 5 days for refractory nausea / vomiting. If not helped with zofran 15 tablet 1  . metoCLOPramide (REGLAN) 10 MG tablet Take 1 tablet (10 mg total) by mouth every 8 (eight) hours for 5 days. for refractory nausea/vomiting. 15 tablet 1  . nicotine (NICODERM CQ - DOSED IN MG/24 HR) 7 mg/24hr patch Place 1-3 patches (7-21 mg total) onto the skin  daily as needed (Nicotine withdrawal). (Patient not taking: Reported on 08/20/2020) 28 patch 0  . nicotine (NICODERM CQ - DOSED IN MG/24 HR) 7 mg/24hr patch PLACE 1-3 PATCHES (7-21 MG TOTAL) ONTO THE SKIN DAILY AS NEEDED (NICOTINE WITHDRAWAL). (Patient not taking: Reported on 08/20/2020) 28 patch 0   No current facility-administered medications on file prior to visit.    ROS: all negative except above.   Physical Exam: Filed Weights   08/20/20 1336  Weight: 208 lb 6.4 oz (94.5 kg)   BP 135/80 (BP Location: Right Arm, Patient Position: Sitting, Cuff Size: Normal)   Pulse 70   Temp (!) 97.3 F (36.3 C) (Temporal)   Ht 5' 6"  (1.676 m)   Wt 208 lb 6.4 oz (94.5 kg)   SpO2 98%   BMI 33.64 kg/m  General Appearance: Well nourished,obese  in no apparent distress. Eyes: PERRLA, EOMs, conjunctiva no swelling or erythema Sinuses: No Frontal/maxillary tenderness ENT/Mouth: Ext aud canals clear, TMs without erythema, bulging. Hearing normal.  Neck: Supple, thyroid normal.  Respiratory: Respiratory effort normal, BS equal bilaterally without rales, rhonchi, wheezing or stridor.  Cardio: RRR with no MRGs. Brisk peripheral pulses without edema.  Abdomen: Soft, + BS.  Non tender, no guarding, rebound, hernias, masses. Lymphatics: Non tender without lymphadenopathy.  Musculoskeletal: Full ROM, 5/5 strength, normal gait.  Skin: Warm, dry without rashes, lesions, ecchymosis.  Neuro: Cranial nerves intact. Normal muscle tone, no cerebellar symptoms. Sensation intact.  Psych: Awake and oriented X 3, normal affect, Insight and Judgment appropriate.    Ryan Hughes was seen today for hospitalization follow-up.  Diagnoses and all orders for this visit:  Hospital discharge follow-up Recommended to follow-up with primary care nausea and vomiting has resolved.  He is still not eating well and does not have   Uncontrolled type 2 diabetes mellitus with hyperglycemia, with long-term current use of insulin  (HCC) A1C is 9.8.  Previously discharged from hospital medications adjusted no refill of medications needed at this time. : Goal of therapy: Less than 6.5 hemoglobin A1 . Decrease foods  that are high in carbohydrates are the following rice, potatoes, breads, sugars, and pastas.  Reduction in the intake (eating) will assist in lowering your blood sugars.  Chronic gout of left foot, unspecified cause -     naproxen (NAPROSYN) 500 MG tablet; Take 1 tablet (500 mg total) by mouth 2 (two) times daily with a meal.    Essential hypertension Blood pressure is controlled goal of therapy is 130/80 he is currently on lisinopril 20 mg and monitoring sodium intake this also helps with renal protection.  Exercising as tolerated 30 minutes daily 450 minutes weekly   [name], NP 1:44 PM

## 2020-08-20 NOTE — Progress Notes (Signed)
Would like to have social worker contact him  Has poor appetite only wants to eat is serbert

## 2020-08-27 ENCOUNTER — Other Ambulatory Visit: Payer: Self-pay

## 2020-09-03 ENCOUNTER — Telehealth: Payer: Self-pay

## 2020-09-03 NOTE — Telephone Encounter (Signed)
Copied from CRM [id number]. Topic: Appointment Scheduling - Scheduling Inquiry for Clinic >> Sep 02, 2020 12:07 PM Elliot Gault wrote: Gwinda Passe, NP informed patient to call [facility] regarding financial assistance / no insurance. PCP advised to ask for "Erskine Squibb" Caller states patient has no income and would like a follow up call regarding the process. Best # [phone]. Caller states patient unable to read well therefore spouse will communicate.

## 2020-09-03 NOTE — Telephone Encounter (Signed)
Call placed to patient to address questions regarding financial assistance.  The patient answered and requested that this CM also speak to his girlfriend, Lucy Antigua.  They explained that the patient has been disabled since his stroke and he is not able to work.  He has no income but his girlfriend receives disability.  He is not in danger of homelessness at this time.  He was in agreement to making a referral to [facility] to assist with a disability application/medicaid application  if he meets eligibility requirements.   Referral sent via [employer] email to [facility].   This CM to follow up with patient to confirm if [facility] will be able to assist him and to discuss applying for [facility] Financial Assistance/Orange Card if patient is not eligible for their assistance.

## 2020-09-10 NOTE — Telephone Encounter (Signed)
Call placed to patient to inquire if he has been  contacted by  [facility].  Spoke to his girlfriend, Lucy Antigua, who stated yes, that they have a telephone interview  with [facility] next Monday, 09/14/2020  or Tuesday, 09/15/2020.    This CM explained that while they are waiting for more information from [facility], the patient can apply for [facility] Financial Assistance/Orange Card as long as he does not have a pending medicaid application.  Rosey Bath confirmed that he has not submitted a medicaid application.  She was in agreement to having an application sent to him.   CFA/OC application then sent to patient with instructions to contact this CM or [facility] with any questions

## 2020-09-24 NOTE — ED Provider Notes (Signed)
[facility]  SURGICAL Provider Note   CSN: [id number] Arrival date & time: 08/05/20  1107     History Chief Complaint  Patient presents with  . Nausea  . Emesis  . Hypertension    Also c/o gout in left foot    Ryan Hughes is a 55 y.o. male.  Patient presents with non bloody vomiting for 4 days with nausea and abdominal pain. Patient has not been taking his home medications due to this. Intermittent discomfort non radiating. No fevers or chills. DM hx. Pt with left foot pain similar to previous/ gout hx. No injury.        Past Medical History:  Diagnosis Date  . Diabetes mellitus without complication ([place])   . Hypertension   . Nicotine dependence, cigarettes, uncomplicated 98/33/8250    Patient Active Problem List   Diagnosis Date Noted  . Intractable nausea and vomiting 08/05/2020  . HTN (hypertension) 08/05/2020  . Acute arterial ischemic stroke, vertebrobasilar, thalamic, left ([place]) 04/25/2020  . Mixed hyperlipidemia due to type 2 diabetes mellitus ([place]) 04/25/2020  . Benign prostatic hyperplasia (BPH) with straining on urination 04/25/2020  . Nicotine dependence, cigarettes, uncomplicated 53/97/6734  . Stroke ([place]) 04/25/2020  . Rash 03/19/2013  . Urinary hesitancy 03/19/2013  . Back spasm 05/16/2012  . Foot pain 07/18/2011  . Diabetes mellitus ([place]) 06/18/2011  . Tingling in extremities 06/18/2011    History reviewed. No pertinent surgical history.     Family History  Problem Relation Age of Onset  . Stroke Mother     Social History   Tobacco Use  . Smoking status: Current Every Day Smoker    Packs/day: 0.50    Types: Cigarettes  . Smokeless tobacco: Never Used  Vaping Use  . Vaping Use: Never used  Substance Use Topics  . Alcohol use: Yes  . Drug use: Yes    Types: Marijuana    Home Medications Prior to Admission medications   Medication Sig Start Date End Date Taking? Authorizing Provider  blood glucose  meter kit and supplies KIT Dispense based on patient and insurance preference. Use up to four times daily as directed. (FOR ICD-9 250.00, 250.01). 04/27/20  Yes Dwyane Dee, MD  insulin glargine (LANTUS) 100 UNIT/ML Solostar Pen Inject 20 Units into the skin daily. 05/21/20  Yes [name], NP  metoCLOPramide (REGLAN) 10 MG tablet Take 1 tablet (10 mg total) by mouth every 8 (eight) hours as needed for up to 5 days for refractory nausea / vomiting. If not helped with zofran 08/07/20 08/12/20 Yes Pokhrel, Laxman, MD  pantoprazole (PROTONIX) 40 MG tablet Take 1 tablet (40 mg total) by mouth daily. 08/07/20 08/07/21 Yes Pokhrel, Corrie Mckusick, MD  TRUE METRIX BLOOD GLUCOSE TEST test strip SMARTSIG:Via Meter 1 to 4 Times Daily 04/27/20  Yes [provider]  TRUEplus Lancets 28G MISC SMARTSIG:Topical 1 to 4 Times Daily 04/27/20  Yes [provider]  atorvastatin (LIPITOR) 40 MG tablet TAKE 1 TABLET (40 MG TOTAL) BY MOUTH DAILY. 05/21/20 05/21/21  [name], NP  Blood Glucose Monitoring Suppl (TRUE METRIX METER) w/Device KIT USE UP TO 4 TIMES DAILY AS DIRECTED 04/27/20 04/27/21  Dwyane Dee, MD  glipiZIDE (GLUCOTROL) 10 MG tablet TAKE 1 TABLET (10 MG TOTAL) BY MOUTH 2 (TWO) TIMES DAILY BEFORE A MEAL. 05/21/20 05/21/21  [name], NP  glucose blood test strip USE UP TO FOUR TIMES DAILY AS DIRECTED Patient taking differently: USE UP TO FOUR TIMES DAILY  AS DIRECTED 04/27/20 04/27/21  Dwyane Dee, MD  Insulin Glargine (BASAGLAR KWIKPEN) 100 UNIT/ML INJECT 20 UNITS INTO THE SKIN DAILY. 05/21/20 05/21/21  [name], NP  insulin lispro (HUMALOG) 100 UNIT/ML KwikPen INJECT 2-15 UNITS INTO THE SKIN 4 (FOUR) TIMES DAILY - BEFORE MEALS AND AT BEDTIME. 07/01/20 07/01/21  Charlott Rakes, MD  Insulin Pen Needle 31G X 5 MM MISC USE AS DIRECTED IN THE MORNING, NOON, EVENING AND BEDTIME 05/21/20 05/21/21  [name], NP  lisinopril (ZESTRIL) 20 MG tablet TAKE 1 TABLET (20 MG  TOTAL) BY MOUTH DAILY. 05/21/20 05/21/21  [name], NP  metFORMIN (GLUCOPHAGE) 1000 MG tablet TAKE 1 TABLET (1,000 MG TOTAL) BY MOUTH 2 (TWO) TIMES DAILY WITH A MEAL. 05/21/20 05/21/21  [name], NP  metoCLOPramide (REGLAN) 10 MG tablet Take 1 tablet (10 mg total) by mouth every 8 (eight) hours for 5 days. for refractory nausea/vomiting. 08/10/20 08/15/20  Pokhrel, Corrie Mckusick, MD  naproxen (NAPROSYN) 500 MG tablet Take 1 tablet (500 mg total) by mouth 2 (two) times daily with a meal. 08/20/20   [name], NP  nicotine (NICODERM CQ - DOSED IN MG/24 HR) 7 mg/24hr patch Place 1-3 patches (7-21 mg total) onto the skin daily as needed (Nicotine withdrawal). Patient not taking: Reported on 08/20/2020 08/07/20   Pokhrel, Corrie Mckusick, MD  nicotine (NICODERM CQ - DOSED IN MG/24 HR) 7 mg/24hr patch PLACE 1-3 PATCHES (7-21 MG TOTAL) ONTO THE SKIN DAILY AS NEEDED (NICOTINE WITHDRAWAL). Patient not taking: Reported on 08/20/2020 08/07/20 08/07/21  Pokhrel, Corrie Mckusick, MD  ondansetron (ZOFRAN) 4 MG tablet Take 1 tablet (4 mg total) by mouth every 6 (six) hours as needed for nausea. 08/07/20   Pokhrel, Laxman, MD  ondansetron (ZOFRAN) 4 MG tablet TAKE 1 TABLET (4 MG TOTAL) BY MOUTH EVERY SIX HOURS AS NEEDED FOR NAUSEA. 08/07/20 08/07/21  Pokhrel, Laxman, MD  pantoprazole (PROTONIX) 40 MG tablet TAKE 1 TABLET (40 MG TOTAL) BY MOUTH DAILY. 08/07/20 08/07/21  Pokhrel, Corrie Mckusick, MD  sulfamethoxazole-trimethoprim (BACTRIM DS) 800-160 MG tablet TAKE 2 TABLETS BY MOUTH EVERY TWELVE HOURS FOR 3 DAYS. 04/27/20 04/27/21  Dwyane Dee, MD  tamsulosin (FLOMAX) 0.4 MG CAPS capsule TAKE 1 CAPSULE (0.4 MG TOTAL) BY MOUTH DAILY AFTER BREAKFAST. 05/21/20 05/21/21  [name], NP  TRUEplus Lancets 28G MISC USE UP TO 4 TIMES DAILY AS DIRECTED 04/27/20 04/27/21  Dwyane Dee, MD    Allergies    Patient has no known allergies.  Review of Systems   Review of Systems  Constitutional: Negative for chills and fever.  HENT: Negative  for congestion.   Eyes: Negative for visual disturbance.  Respiratory: Negative for shortness of breath.   Cardiovascular: Negative for chest pain.  Gastrointestinal: Positive for abdominal pain, nausea and vomiting.  Genitourinary: Negative for dysuria and flank pain.  Musculoskeletal: Negative for back pain, neck pain and neck stiffness.  Skin: Negative for rash.  Neurological: Negative for light-headedness and headaches.    Physical Exam Updated Vital Signs BP (!) 154/86 (BP Location: Right Arm)   Pulse 79   Temp 98.7 F (37.1 C) (Oral)   Resp 18   Ht 5' 6"  (1.676 m)   Wt 104.3 kg   SpO2 99%   BMI 37.12 kg/m   Physical Exam Vitals and nursing note reviewed.  Constitutional:      Appearance: He is well-developed.  HENT:     Head: Normocephalic and atraumatic.     Mouth/Throat:     Mouth:  Mucous membranes are dry.  Eyes:     General:        Right eye: No discharge.        Left eye: No discharge.     Conjunctiva/sclera: Conjunctivae normal.  Neck:     Trachea: No tracheal deviation.  Cardiovascular:     Rate and Rhythm: Regular rhythm. Tachycardia present.  Pulmonary:     Effort: Pulmonary effort is normal.     Breath sounds: Normal breath sounds.  Abdominal:     General: There is no distension.     Palpations: Abdomen is soft.     Tenderness: There is abdominal tenderness (epigastric). There is no guarding.  Musculoskeletal:     Cervical back: Normal range of motion and neck supple.     Comments: Pt has mild tenderness, no external sign of infection dorsal left foot  Skin:    General: Skin is warm.     Findings: No rash.  Neurological:     Mental Status: He is alert and oriented to person, place, and time.     ED Results / Procedures / Treatments   Labs (all labs ordered are listed, but only abnormal results are displayed) Labs Reviewed  CBC - Abnormal; Notable for the following components:      Result Value   WBC 11.5 (*)    Hemoglobin 17.6 (*)     All other components within normal limits  COMPREHENSIVE METABOLIC PANEL - Abnormal; Notable for the following components:   Chloride 94 (*)    Glucose, Bld 357 (*)    AST 11 (*)    All other components within normal limits  URINALYSIS, ROUTINE W REFLEX MICROSCOPIC - Abnormal; Notable for the following components:   Glucose, UA >=500 (*)    Hgb urine dipstick SMALL (*)    Ketones, ur 80 (*)    Protein, ur >=300 (*)    All other components within normal limits  HEMOGLOBIN A1C - Abnormal; Notable for the following components:   Hgb A1c MFr Bld 9.8 (*)    All other components within normal limits  BETA-HYDROXYBUTYRIC ACID - Abnormal; Notable for the following components:   Beta-Hydroxybutyric Acid 2.10 (*)    All other components within normal limits  BLOOD GAS, VENOUS - Abnormal; Notable for the following components:   pH, Ven 7.450 (*)    pO2, Ven 58.4 (*)    Bicarbonate 32.0 (*)    Acid-Base Excess 7.8 (*)    All other components within normal limits  BASIC METABOLIC PANEL - Abnormal; Notable for the following components:   Glucose, Bld 223 (*)    All other components within normal limits  RAPID URINE DRUG SCREEN, HOSP PERFORMED - Abnormal; Notable for the following components:   Tetrahydrocannabinol POSITIVE (*)    All other components within normal limits  GLUCOSE, CAPILLARY - Abnormal; Notable for the following components:   Glucose-Capillary 263 (*)    All other components within normal limits  GLUCOSE, CAPILLARY - Abnormal; Notable for the following components:   Glucose-Capillary 231 (*)    All other components within normal limits  GLUCOSE, CAPILLARY - Abnormal; Notable for the following components:   Glucose-Capillary 245 (*)    All other components within normal limits  GLUCOSE, CAPILLARY - Abnormal; Notable for the following components:   Glucose-Capillary 203 (*)    All other components within normal limits  GLUCOSE, CAPILLARY - Abnormal; Notable for the following  components:   Glucose-Capillary 181 (*)  All other components within normal limits  BASIC METABOLIC PANEL - Abnormal; Notable for the following components:   Potassium 3.4 (*)    Glucose, Bld 146 (*)    All other components within normal limits  GLUCOSE, CAPILLARY - Abnormal; Notable for the following components:   Glucose-Capillary 132 (*)    All other components within normal limits  GLUCOSE, CAPILLARY - Abnormal; Notable for the following components:   Glucose-Capillary 155 (*)    All other components within normal limits  GLUCOSE, CAPILLARY - Abnormal; Notable for the following components:   Glucose-Capillary 146 (*)    All other components within normal limits  GLUCOSE, CAPILLARY - Abnormal; Notable for the following components:   Glucose-Capillary 142 (*)    All other components within normal limits  GLUCOSE, CAPILLARY - Abnormal; Notable for the following components:   Glucose-Capillary 158 (*)    All other components within normal limits  CBG MONITORING, ED - Abnormal; Notable for the following components:   Glucose-Capillary 289 (*)    All other components within normal limits  CBG MONITORING, ED - Abnormal; Notable for the following components:   Glucose-Capillary 268 (*)    All other components within normal limits  I-STAT VENOUS BLOOD GAS, ED - Abnormal; Notable for the following components:   pH, Ven 7.440 (*)    Bicarbonate 35.0 (*)    TCO2 37 (*)    Acid-Base Excess 9.0 (*)    Calcium, Ion 1.11 (*)    All other components within normal limits  CBG MONITORING, ED - Abnormal; Notable for the following components:   Glucose-Capillary 322 (*)    All other components within normal limits  RESP PANEL BY RT-PCR (FLU A&B, COVID) ARPGX2  LIPASE, BLOOD  LACTIC ACID, PLASMA  CBC  MAGNESIUM  PHOSPHORUS  TROPONIN I (HIGH SENSITIVITY)    EKG EKG Interpretation  Date/Time:  Wednesday August 05 2020 11:12:50 EDT Ventricular Rate:  121 PR Interval:  126 QRS  Duration: 114 QT Interval:  345 QTC Calculation: 490 R Axis:   107 Text Interpretation: Sinus tachycardia Ventricular trigeminy Probable left atrial enlargement Left posterior fascicular block Borderline prolonged QT interval Confirmed by Elnora Morrison [phone]) on 08/05/2020 1:35:09 PM   Radiology No results found.  Procedures Procedures   Medications Ordered in ED Medications  potassium chloride 10 mEq in 100 mL IVPB (10 mEq Intravenous New Bag/Given 08/07/20 1100)  sodium chloride 0.9 % bolus 500 mL (0 mLs Intravenous Stopped 08/05/20 1315)  ondansetron (ZOFRAN) injection 4 mg (4 mg Intravenous Given 08/05/20 1228)  sodium chloride 0.9 % bolus 500 mL (0 mLs Intravenous Stopped 08/05/20 1351)  sodium chloride 0.9 % bolus 500 mL (0 mLs Intravenous Stopped 08/05/20 1600)  iohexol (OMNIPAQUE) 350 MG/ML injection 100 mL (100 mLs Intravenous Contrast Given 08/05/20 1455)  sodium chloride 0.9 % bolus 500 mL (0 mLs Intravenous Stopped 08/05/20 1746)  LORazepam (ATIVAN) injection 1 mg (1 mg Intravenous Given 08/05/20 1848)  sodium chloride 0.9 % bolus 1,000 mL (0 mLs Intravenous Stopped 08/05/20 2149)    ED Course  I have reviewed the triage vital signs and the nursing notes.  Pertinent labs & imaging results that were available during my care of the patient were reviewed by me and considered in my medical decision making (see chart for details).    MDM Rules/Calculators/A&P                          Patient with  recurrent vomiting, clinically dehydrated with tachycardia, decreased oral intake. Iv fluids and anti-emetics given. Pt continues to feel poorly on reassessment. Blood work shows mild leukocytosis 11.5, glucose >300. UA showed small ketones in the urine. Concern clinically for uncontrolled DM, hyperglycemia and mild DKA with ketones in UA/small anion gap. Pt care signed out to reassess/ follow up results/ CT.   Final Clinical Impression(s) / ED Diagnoses Final diagnoses:  Intractable  vomiting with nausea, unspecified vomiting type  Dehydration    Rx / DC Orders ED Discharge Orders         Ordered    nicotine (NICODERM CQ - DOSED IN MG/24 HR) 7 mg/24hr patch  Daily PRN        08/07/20 1020    ondansetron (ZOFRAN) 4 MG tablet  Every 6 hours PRN        08/07/20 1020    pantoprazole (PROTONIX) 40 MG tablet  Daily        08/07/20 1020    metoCLOPramide (REGLAN) 10 MG tablet  Every 8 hours PRN        08/07/20 1020    Increase activity slowly        08/07/20 1020    Discharge instructions       Comments: Follow-up with your primary care physician in 1 week.  Small frequent meals.  Avoid high-fiber diet, alcohol, carbonated drinks, fatty fried foods.  Gradually advance your diet.  Stay on full liquid diet today.   08/07/20 1020    Diet Carb Modified        08/07/20 1020    Call MD for:  persistant nausea and vomiting        08/07/20 1020    Call MD for:  severe uncontrolled pain        08/07/20 1020           Elnora Morrison, MD 09/24/20 1141

## 2020-10-13 ENCOUNTER — Telehealth: Payer: Self-pay

## 2020-10-13 NOTE — Telephone Encounter (Signed)
Message received from [facility] -   We received his signature paperwork back on 10/06/2020 and set a protective filing date on 10/06/2020. He should have received a letter from [employer] that Ronda had started an application. Now that she has his paperwork back, she can prep and file his claim.

## 2020-10-28 ENCOUNTER — Telehealth: Payer: Self-pay

## 2020-10-28 NOTE — Telephone Encounter (Signed)
A Disability application for the patient has been submitted to [facility] by [facility]

## 2020-12-15 ENCOUNTER — Ambulatory Visit (INDEPENDENT_AMBULATORY_CARE_PROVIDER_SITE_OTHER): Payer: Self-pay | Admitting: Primary Care

## 2020-12-31 ENCOUNTER — Other Ambulatory Visit: Payer: Self-pay

## 2020-12-31 ENCOUNTER — Ambulatory Visit (INDEPENDENT_AMBULATORY_CARE_PROVIDER_SITE_OTHER): Payer: Self-pay | Admitting: Primary Care

## 2020-12-31 ENCOUNTER — Encounter (INDEPENDENT_AMBULATORY_CARE_PROVIDER_SITE_OTHER): Payer: Self-pay | Admitting: Primary Care

## 2020-12-31 VITALS — BP 127/77 | HR 89 | Temp 97.5°F | Ht 66.0 in | Wt 192.4 lb

## 2020-12-31 DIAGNOSIS — I10 Essential (primary) hypertension: Secondary | ICD-10-CM

## 2020-12-31 DIAGNOSIS — E1165 Type 2 diabetes mellitus with hyperglycemia: Secondary | ICD-10-CM

## 2020-12-31 LAB — POCT GLYCOSYLATED HEMOGLOBIN (HGB A1C): Hemoglobin A1C: 11.9 % — AB (ref 4.0–5.6)

## 2020-12-31 LAB — POCT URINALYSIS DIP (CLINITEK)
Bilirubin, UA: NEGATIVE
Blood, UA: NEGATIVE
Glucose, UA: >=1000 mg/dL — AB
Leukocytes, UA: NEGATIVE
Nitrite, UA: NEGATIVE
POC PROTEIN,UA: NEGATIVE
Spec Grav, UA: 1.025 (ref 1.010–1.025)
Urobilinogen, UA: 0.2 E.U./dL
pH, UA: 5 (ref 5.0–8.0)

## 2020-12-31 LAB — GLUCOSE, POCT (MANUAL RESULT ENTRY): POC Glucose: 354 mg/dl — AB (ref 70–99)

## 2020-12-31 NOTE — Progress Notes (Signed)
Subjective:  Patient ID: Ryan Hughes, male    DOB: 11/18/1965  Age: 55 y.o. MRN: [id number]  CC: DM   HPI Ryan Hughes is a 55 year old obese male presents for follow-up of diabetes. Patient does not check blood sugar at home. He does not take his medication nor interested in his appointment or health. Only reason he even came to this appt.was his girlfriend insisted. Discussed  co- morbidities with uncontrol diabetes  Complications -diabetic retinopathy, (close your eyes ? What do you see nothing) nephropathy decrease in kidney function- can lead to dialysis-on a machine 3 days a week to filter your kidney, neuropathy- numbness and tinging in your hands and feet,  increase risk of heart attack and stroke, and amputation due to decrease wound healing and circulation. Decrease your risk by taking medication daily as prescribed, monitor carbohydrates- foods that are high in carbohydrates are the following rice, potatoes, breads, sugars, and pastas.  Reduction in the intake (eating) will assist in lowering your blood sugars. Exercise daily at least 30 minutes daily. This information did not concern him nor did he respond.  Compliant with meds - No Checking CBGs? No  Fasting avg -   Postprandial average -  Exercising regularly? - No Watching carbohydrate intake? - No Neuropathy ? - Yes Hypoglycemic events - No  - Recovers with :   Pertinent ROS:  Polyuria - Yes Polydipsia - Yes Vision problems - No  Medications as noted below. Taking them regularly without complication/adverse reaction being reported today.   History Ryan Hughes has a past medical history of Diabetes mellitus without complication ([place]), Hypertension, and Nicotine dependence, cigarettes, uncomplicated (02/[id number]).   He has no past surgical history on file.   His family history includes Stroke in his mother.He reports that he has been smoking cigarettes. He has been smoking an average of .5 packs per day. He has never  used smokeless tobacco. He reports current alcohol use. He reports current drug use. Drug: Marijuana.  Current Outpatient Medications on File Prior to Visit  Medication Sig Dispense Refill   atorvastatin (LIPITOR) 40 MG tablet TAKE 1 TABLET (40 MG TOTAL) BY MOUTH DAILY. (Patient not taking: Reported on 12/31/2020) 90 tablet 3   blood glucose meter kit and supplies KIT Dispense based on patient and insurance preference. Use up to four times daily as directed. (FOR ICD-9 250.00, 250.01). (Patient not taking: Reported on 12/31/2020) 1 each 0   Blood Glucose Monitoring Suppl (TRUE METRIX METER) w/Device KIT USE UP TO 4 TIMES DAILY AS DIRECTED (Patient not taking: Reported on 12/31/2020) 1 kit 0   glipiZIDE (GLUCOTROL) 10 MG tablet TAKE 1 TABLET (10 MG TOTAL) BY MOUTH 2 (TWO) TIMES DAILY BEFORE A MEAL. (Patient not taking: Reported on 12/31/2020) 180 tablet 1   glucose blood test strip USE UP TO FOUR TIMES DAILY AS DIRECTED (Patient not taking: Reported on 12/31/2020) 200 strip 0   Insulin Glargine (BASAGLAR KWIKPEN) 100 UNIT/ML INJECT 20 UNITS INTO THE SKIN DAILY. (Patient not taking: Reported on 12/31/2020) 15 mL 11   insulin glargine (LANTUS) 100 UNIT/ML Solostar Pen Inject 20 Units into the skin daily. (Patient not taking: Reported on 12/31/2020) 15 mL 11   insulin lispro (HUMALOG) 100 UNIT/ML KwikPen INJECT 2-15 UNITS INTO THE SKIN 4 (FOUR) TIMES DAILY - BEFORE MEALS AND AT BEDTIME. (Patient not taking: Reported on 12/31/2020) 15 mL 2   Insulin Pen Needle 31G X 5 MM MISC USE AS DIRECTED IN THE MORNING, NOON, EVENING  AND BEDTIME (Patient not taking: Reported on 12/31/2020) 100 each 11   lisinopril (ZESTRIL) 20 MG tablet TAKE 1 TABLET (20 MG TOTAL) BY MOUTH DAILY. (Patient not taking: Reported on 12/31/2020) 30 tablet 11   metFORMIN (GLUCOPHAGE) 1000 MG tablet TAKE 1 TABLET (1,000 MG TOTAL) BY MOUTH 2 (TWO) TIMES DAILY WITH A MEAL. (Patient not taking: Reported on 12/31/2020) 180 tablet 3   metoCLOPramide (REGLAN)  10 MG tablet Take 1 tablet (10 mg total) by mouth every 8 (eight) hours as needed for up to 5 days for refractory nausea / vomiting. If not helped with zofran 15 tablet 1   metoCLOPramide (REGLAN) 10 MG tablet Take 1 tablet (10 mg total) by mouth every 8 (eight) hours for 5 days. for refractory nausea/vomiting. 15 tablet 1   pantoprazole (PROTONIX) 40 MG tablet TAKE 1 TABLET (40 MG TOTAL) BY MOUTH DAILY. (Patient not taking: Reported on 12/31/2020) 30 tablet 1   tamsulosin (FLOMAX) 0.4 MG CAPS capsule TAKE 1 CAPSULE (0.4 MG TOTAL) BY MOUTH DAILY AFTER BREAKFAST. (Patient not taking: Reported on 12/31/2020) 30 capsule 3   TRUE METRIX BLOOD GLUCOSE TEST test strip SMARTSIG:Via Meter 1 to 4 Times Daily (Patient not taking: Reported on 12/31/2020)     TRUEplus Lancets 28G MISC USE UP TO 4 TIMES DAILY AS DIRECTED (Patient not taking: Reported on 12/31/2020) 200 each 0   No current facility-administered medications on file prior to visit.    ROS Review of Systems  Constitutional:  Positive for fatigue.  Endocrine: Positive for polydipsia, polyphagia and polyuria.  All other systems reviewed and are negative.  Objective:  BP 127/77 (BP Location: Right Arm, Patient Position: Sitting, Cuff Size: Normal)   Pulse 89   Temp (!) 97.5 F (36.4 C) (Temporal)   Ht 5' 6"  (1.676 m)   Wt 192 lb 6.4 oz (87.3 kg)   SpO2 99%   BMI 31.05 kg/m   BP Readings from Last 3 Encounters:  12/31/20 127/77  08/20/20 135/80  08/07/20 (!) 154/86    Wt Readings from Last 3 Encounters:  12/31/20 192 lb 6.4 oz (87.3 kg)  08/20/20 208 lb 6.4 oz (94.5 kg)  08/05/20 230 lb (104.3 kg)    Physical Exam Vitals reviewed.  Constitutional:      Appearance: He is obese.  HENT:     Head: Normocephalic.     Right Ear: Tympanic membrane and external ear normal.     Left Ear: Tympanic membrane and external ear normal.     Nose: Nose normal.  Eyes:     Extraocular Movements: Extraocular movements intact.     Pupils: Pupils  are equal, round, and reactive to light.  Cardiovascular:     Rate and Rhythm: Normal rate and regular rhythm.  Pulmonary:     Effort: Pulmonary effort is normal.     Breath sounds: Normal breath sounds.  Abdominal:     General: Bowel sounds are normal. There is distension.  Musculoskeletal:        General: Normal range of motion.     Cervical back: Normal range of motion and neck supple.  Skin:    General: Skin is warm and dry.  Neurological:     Mental Status: He is alert and oriented to person, place, and time.  Psychiatric:        Mood and Affect: Mood normal.        Behavior: Behavior normal.        Thought Content: Thought content normal.  Judgment: Judgment normal.   Lab Results  Component Value Date   HGBA1C 11.9 (A) 12/31/2020   HGBA1C 9.8 (H) 08/05/2020   HGBA1C 14.2 (H) 04/25/2020   Assessment & Plan:   Diagnoses and all orders for this visit:  Uncontrolled type 2 diabetes mellitus with hyperglycemia, without long-term current use of insulin (HCC)  Goal of therapy: Less than 6.5 hemoglobin A1c.  Reference clinical practice recommendations. Foods that are high in carbohydrates are the following rice, potatoes, breads, sugars, and pastas.  Reduction in the intake (eating) will assist in lowering your blood sugars.  -     HgB A1c 11.9 -     Glucose (CBG)  Essential hypertension  Well controlled < 130/80, low-sodium, DASH diet, medication compliance, 150 minutes of moderate intensity exercise per week. Discussed medication compliance, adverse effects.  I have discontinued Skyeler Sturgess's nicotine, ondansetron, sulfamethoxazole-trimethoprim, naproxen, ondansetron, and nicotine. I am also having him maintain his blood glucose meter kit and supplies, True Metrix Blood Glucose Test, insulin glargine, metoCLOPramide, insulin lispro, Insulin Pen Needle, Basaglar KwikPen, metFORMIN, tamsulosin, lisinopril, atorvastatin, glipiZIDE, TRUEplus Lancets 28G, glucose blood,  True Metrix Meter, metoCLOPramide, and pantoprazole.  No orders of the defined types were placed in this encounter.    Follow-up:   No follow-ups on file.  The above assessment and management plan was discussed with the patient. The patient verbalized understanding of and has agreed to the management plan. Patient is aware to call the clinic if symptoms fail to improve or worsen. Patient is aware when to return to the clinic for a follow-up visit. Patient educated on when it is appropriate to go to the emergency department.   Juluis Mire, NP-C

## 2021-01-15 ENCOUNTER — Telehealth: Payer: Self-pay | Admitting: Clinical

## 2021-01-18 NOTE — Telephone Encounter (Signed)
Spoke with pt and scheduled appt for 02/11/21 at 2:00

## 2021-02-11 ENCOUNTER — Institutional Professional Consult (permissible substitution) (INDEPENDENT_AMBULATORY_CARE_PROVIDER_SITE_OTHER): Payer: Self-pay | Admitting: Clinical

## 2021-02-25 ENCOUNTER — Institutional Professional Consult (permissible substitution) (INDEPENDENT_AMBULATORY_CARE_PROVIDER_SITE_OTHER): Payer: Self-pay | Admitting: Clinical

## 2021-02-25 ENCOUNTER — Ambulatory Visit (INDEPENDENT_AMBULATORY_CARE_PROVIDER_SITE_OTHER): Payer: Self-pay | Admitting: Clinical

## 2021-02-25 ENCOUNTER — Telehealth (INDEPENDENT_AMBULATORY_CARE_PROVIDER_SITE_OTHER): Payer: Self-pay

## 2021-02-25 ENCOUNTER — Ambulatory Visit (HOSPITAL_COMMUNITY)
Admission: EM | Admit: 2021-02-25 | Discharge: 2021-02-25 | Disposition: A | Discharge status: Home / Self Care | Payer: No Payment, Other | Attending: Psychiatry | Admitting: Psychiatry

## 2021-02-25 ENCOUNTER — Other Ambulatory Visit: Payer: Self-pay

## 2021-02-25 DIAGNOSIS — Z8673 Personal history of transient ischemic attack (TIA), and cerebral infarction without residual deficits: Secondary | ICD-10-CM | POA: Insufficient documentation

## 2021-02-25 DIAGNOSIS — F4323 Adjustment disorder with mixed anxiety and depressed mood: Secondary | ICD-10-CM

## 2021-02-25 DIAGNOSIS — F4321 Adjustment disorder with depressed mood: Secondary | ICD-10-CM

## 2021-02-25 DIAGNOSIS — R45850 Homicidal ideations: Secondary | ICD-10-CM

## 2021-02-25 NOTE — Discharge Instructions (Addendum)
[facility]- Outpatient 2nd Floor WALK-IN HOURS New Patient Therapy Walk-ins: Monday-Wednesday: 8am until slots are full.  Please arrive by 7:30 am, patients will be seen in the order of arrival.  Friday: 1pm-5pm Please arrive by 12-12:30pm   New Patient Medication Management Walk-ins: Monday-Friday: 8am-11am.  Please arrive by 7:30am, patients will be seen in the order of arrival.   Please Note: Availability is limited, you may not be seen on the same day that you walk-in. Our goal is to serve and meet the needs of our community to the best of our ability.       Patient is instructed prior to discharge to:  Take all medications as prescribed by his/her mental healthcare provider. Report any adverse effects and or reactions from the medicines to his/her outpatient provider promptly. Keep all scheduled appointments, to ensure that you are getting refills on time and to avoid any interruption in your medication.  If you are unable to keep an appointment call to reschedule.  Be sure to follow-up with resources and follow-up appointments provided.  Patient has been instructed & cautioned: To not engage in alcohol and or illegal drug use while on prescription medicines. In the event of worsening symptoms, patient is instructed to call the crisis hotline, 911 and or go to the nearest ED for appropriate evaluation and treatment of symptoms. To follow-up with his/her primary care provider for your other medical issues, concerns and or health care needs.

## 2021-02-25 NOTE — BH Assessment (Signed)
Pt had a virtual appointment with case manager today and after appointment case manager suggested pt come for evaluation. Pt denies SI, HI, AVH, however, per patient girlfriend, pt had some thoughts about wanting to harm his brother. Pt contracts for safety.  Pt reports THC use within last 24 hours.   Pt is routine.

## 2021-02-25 NOTE — ED Provider Notes (Signed)
Behavioral Health Urgent Care Medical Screening Exam  Patient Name: Ryan Hughes MRN: [id number] Date of Evaluation: 02/25/21 Chief Complaint:   Diagnosis:  Final diagnoses:  Adjustment disorder with depressed mood    History of Present illness: Ryan Hughes is a 55 y.o. male.  Patient presents voluntarily to [facility] behavioral health for walk-in assessment.  He is transported by police.  He was encouraged by caseworker with primary care provider's office to seek assessment at [facility] behavioral health.  Ryan Hughes endorses depressed mood.  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  He reports he was self-employed in the [employer] for many years prior to suffering a stroke.  He stopped working in August 2022.  He endorses financial as well as healthcare insurance concerns.  Recently he has "issues with my memory."  He sometimes is unable to recall the day of the week, for example.  Today he is alert and oriented x4, initially states that it is Wednesday but is quickly reoriented.  He states "today is Thursday."  Recent stressors include financial concerns.  He reports he recently had a verbal argument with his brother after his brother completed work that [employer] believes should have belonged to him, this event occurred approximately 2 months ago.  He endorses passive HI toward his brother. Ryan Hughes reports he met with his brother approximately 1 month ago, after this argument occurred.  He indicates there was no discord, no altercation at that meeting.  He did indicate to his case worker that he had thoughts of cutting his brother with a machete.  Today he reports he would not cut his brother with a machete, he contracts verbally for safety with this Probation officer.    Ryan Hughes denies any history of mental health diagnoses.  He denies any family history  of mental illness.  He is willing to seek outpatient psychiatric follow-up as well as counseling.  He does have concern for limited transportation at this time.  He is able to complete appointments virtually.  He is unable to afford medications at this time, would like to apply for free or low-cost psychotropic medications moving forward.  Patient is assessed face-to-face by nurse practitioner.  He is seated in assessment area, no acute distress.  He reports depressed mood with congruent affect.   He denies suicidal ideations.  He denies any history of suicide attempts, denies history of self-harm.  He contracts verbally for safety with this Probation officer.   He has normal speech and behavior.  He denies both auditory and visual hallucinations.  Patient is able to converse coherently with goal-directed thoughts.  Objectively there is no evidence of psychosis/mania or delusional thinking.  Patient resides in [place] with his girlfriend of 12 years, denies access to weapons.  He endorses average sleep and appetite.  He sleeps approximately 5 hours each night and naps during the day.  He eats 3 meals per day, meals prepared by his girlfriend.  He is currently not employed, currently seeking disability benefits.  He endorses marijuana use approximately 2-3 times per week.  He denies alcohol use, he denies substance use aside from marijuana.  Patient offered support and encouragement.  He gives verbal consent to speak with his girlfriend, Helene Kelp phone number [phone].  Spoke with patient's girlfriend Helene Kelp who reports that he has been increasingly depressed times approximately  1 year.  Patient's girlfriend believes patient is frustrated due to being "stuck in the house and none of the family visits."  Patient's girlfriend indicates he has no history of acting out violently and she has no concern that patient would harm himself or anyone else.  Patient's girlfriend confirms no weapons in the home.  Safety planning  completed with patient's girlfriend, Helene Kelp.  Psychiatric Specialty Exam  Presentation  General Appearance:Appropriate for Environment; Casual  Eye Contact:Good  Speech:Clear and Coherent; Normal Rate  Speech Volume:Normal  Handedness:Right   Mood and Affect  Mood:Depressed  Affect:Congruent; Depressed   Thought Process  Thought Processes:Coherent; Goal Directed; Linear  Descriptions of Associations:Intact  Orientation:Full (Time, Place and Person)  Thought Content:Logical; WDL    Hallucinations:None  Ideas of Reference:None  Suicidal Thoughts:No  Homicidal Thoughts:Yes, Passive Without Intent   Sensorium  Memory:Immediate Fair; Recent Fair  Judgment:Good  Insight:Fair   Executive Functions  Concentration:Good  Attention Span:Good  [place] of Knowledge:Good  Language:Good   Psychomotor Activity  Psychomotor Activity:Normal   Assets  Assets:Communication Skills; Desire for Improvement; Financial Resources/Insurance; Housing; Intimacy; Leisure Time; Resilience; Social Support   Sleep  Sleep:Good  Number of hours:  No data recorded  Nutritional Assessment (For OBS and FBC admissions only) Has the patient had a weight loss or gain of 10 pounds or more in the last 3 months?: No Has the patient had a decrease in food intake/or appetite?: No Does the patient have dental problems?: No Does the patient have eating habits or behaviors that may be indicators of an eating disorder including binging or inducing vomiting?: No Has the patient recently lost weight without trying?: 0 Has the patient been eating poorly because of a decreased appetite?: 0 Malnutrition Screening Tool Score: 0    Physical Exam: Physical Exam Vitals and nursing note reviewed.  Constitutional:      Appearance: Normal appearance. He is well-developed.  HENT:     Head: Normocephalic and atraumatic.     Nose: Nose normal.  Cardiovascular:     Rate and  Rhythm: Normal rate.  Pulmonary:     Effort: Pulmonary effort is normal.  Musculoskeletal:        General: Normal range of motion.     Cervical back: Normal range of motion.  Skin:    General: Skin is warm and dry.  Neurological:     Mental Status: He is alert and oriented to person, place, and time.  Psychiatric:        Attention and Perception: Attention and perception normal.        Mood and Affect: Affect normal. Mood is depressed.        Speech: Speech normal.        Behavior: Behavior normal. Behavior is cooperative.        Thought Content: Thought content includes homicidal ideation.        Cognition and Memory: Cognition and memory normal.        Judgment: Judgment normal.   Review of Systems  Constitutional: Negative.   HENT: Negative.    Eyes: Negative.   Respiratory: Negative.    Cardiovascular: Negative.   Gastrointestinal: Negative.   Genitourinary: Negative.   Musculoskeletal: Negative.   Skin: Negative.   Neurological: Negative.   Endo/Heme/Allergies: Negative.   Psychiatric/Behavioral:  Positive for depression and memory loss.   Blood pressure (!) 158/82, pulse 83, temperature 98.6 F (37 C), temperature source Oral, resp. rate 16, SpO2 100 %. There is no height  or weight on file to calculate BMI.  Musculoskeletal: Strength & Muscle Tone: within normal limits Gait & Station: normal Patient leans: N/A   [place] [facility] Discharge Disposition for Follow up and Recommendations: Based on my evaluation the patient does not appear to have an emergency medical condition and can be discharged with resources and follow up care in outpatient services for Medication Management and Individual Therapy Reviewed with Dr. Serafina Mitchell. Follow-up with outpatient psychiatry at [facility] behavioral health.   Lucky Rathke, FNP 02/25/2021, 7:08 PM

## 2021-02-25 NOTE — BH Specialist Note (Signed)
Integrated Behavioral Health via Telemedicine Visit  02/25/2021 Ryan Hughes [id number]  Number of Integrated Behavioral Health visits: 1/6 Session Start time: 4:15pm  Session End time: 5:15pm Total time: 60  Referring Provider: PCP Ryan Passe, NP Patient/Family location: Home [facility] Provider location: [facility] All persons participating in visit: Pt and LCSWA. Pt's girlfriend was present. Types of Service: Individual psychotherapy  I connected with Ryan Hughes via Video Enabled Telemedicine Application  (Video is Caregility application) and verified that I am speaking with the correct person using two identifiers. Discussed confidentiality: Yes   I discussed the limitations of telemedicine and the availability of in person appointments.  Discussed there is a possibility of technology failure and discussed alternative modes of communication if that failure occurs.  I discussed that engaging in this telemedicine visit, they consent to the provision of behavioral healthcare and the services will be billed under their insurance.  Patient and/or legal guardian expressed understanding and consented to Telemedicine visit: Yes   Presenting Concerns: Patient and/or family reports the following symptoms/concerns: Pt reports feeling depressed, decreased interest in activities, decreased energy, self-esteem disturbances, trouble concentrating, worrying, trouble relaxing, restlessness, and irritability. Reports that he is having difficulty adjusting to his physical health due to having a stroke at the end of 2021 which has caused memory issues. Reports having financial strains due to no income and having to rely on his girlfriend. Reports homicidal ideations with a method and plan. Reports he would utilize a gun if he had one. Reports that he has a machete in his home that he would use on his brother if he saw him. Reports that he has not seen his brother in a month. Reports that his brother  impacted his only source of income by taking his job of moving tires.  Duration of problem:  1 year; Severity of problem: severe  Patient and/or Family's Strengths/Protective Factors: Concrete supports in place (healthy food, safe environments, etc.)  Goals Addressed: Patient will:  Reduce symptoms of: agitation, anxiety, and depression   Increase knowledge and/or ability of: coping skills   Demonstrate ability to: Increase healthy adjustment to current life circumstances  Progress towards Goals: Ongoing  Interventions: Interventions utilized:  Mindfulness or Management consultant, CBT Cognitive Behavioral Therapy, and Supportive Counseling Standardized Assessments completed: GAD-7 and PHQ 9  Patient and/or Family Response: Pt receptive to cognitive restructuring utilized to decrease pt's negative thoughts. Pt receptive to adjusting behavior by increasing time on the porch and opening the blinds to gain more sunlight. Pt receptive to going to the hospital voluntarily due to HI.   Assessment: Patient currently experiencing depression and anxiety as a result of physical health problems. Pt endorsed HI with a method and plan towards his brother. Pt endorses plan as utilizing a gun if he had it or utilizing a machete that his has if he were to see his brother. Pt endorses protective factor as not wanting to go to prison. Pt also does not have access to transportation and has difficulty with memory. Pt was receptive to going to the hospital voluntarily. LCSWA contacted [employer] to come transport pt to the hospital. LCSWA spoke with Officer Meban while on the way to the pt's home and recommended for an IVC. LCSWA staffed this with supervisor who advised for [employer] to continue picking pt up and to IVC if pt was resistant to going to the hospital. LCSWA attempted to call Officer Mebane back at 5:50pm to inform of this however there was no answer and no  ability to leave vm due to vm not being set up. LCSWA  received phone call back at 6:03pm however LCSWA was no longer in the office. LCSWA received call back at 6:36pm and spoke with Officer Meban who stated they transported pt to [facility]. Officer Meban stated pt was initially resistant to going to the hospital and that his neighbor was trying to convince him to not go however pt did voluntarily go to [facility]. LCSWA spoke with Behavioral Health Crisis Counselor Ryan Hughes and informed her that LCSWA will make an IVC. LCSWA also staffed this with supervisor. LCSWA went to magistrate to attempt to complete IVC however was advised that due to pt already being at [facility] that they would complete an IVC if needed due to pt being under the care. LCSWA staffed this with supervisor again who agreed that due to pt already being at [facility] voluntarily, he is no longer under LCSWA's care. Additionally, pt's girlfriend expressed concerns with pt being depressed and irritable due to health concerns. Pt tearful during session.  Patient may benefit from therapy and psychiatry. LCSWA provided psychoeducation on depression and anxiety. LCSWA utilized cognitive restructuring and behavioral activation. LCSWA encouraged pt to increase sunlight exposure by spending time on porch and opening blinds daily. Pt and pt's girlfriend also expressed concerns with needing to apply for medicaid. LCSWA will have pt referred to [facility] for [facility] application and also provided pt with additional health insurance options. LCSWA will fu with pt in one week.  Plan: Follow up with behavioral health clinician on : 03/04/21 Behavioral recommendations: Increase sunlight exposure by spending time on porch and opening blinds daily Referral(s): Integrated [facility] (In Clinic) and [facility]  I discussed the assessment and treatment plan with the patient and/or parent/guardian. They were provided an opportunity to ask questions and all were answered. They agreed with the plan and  demonstrated an understanding of the instructions.   They were advised to call back or seek an in-person evaluation if the symptoms worsen or if the condition fails to improve as anticipated.  Ranell Skibinski C Zakhai Meisinger, LCSW

## 2021-02-25 NOTE — Telephone Encounter (Signed)
Copied from CRM [id number]. Topic: Appointment Scheduling - Scheduling Inquiry for Clinic >> Feb 25, 2021 10:18 AM Elliot Gault wrote: Reason for CRM: Patient called stating transportation has not picked him up as of 10:18am, patient would like a follow up call from [facility]

## 2021-02-25 NOTE — ED Notes (Signed)
Pt verbalized understanding of discharge instructions [employer] here to transport

## 2021-02-25 NOTE — Telephone Encounter (Signed)
Patient called stating transportation has not picked him up as of 3:46pm for his 4pm appointment for today.

## 2021-02-26 ENCOUNTER — Telehealth (HOSPITAL_COMMUNITY): Payer: Self-pay | Admitting: Primary Care

## 2021-02-26 NOTE — BH Assessment (Signed)
Care Management - Follow Up [facility] Discharges   Writer made contact with the patient. Patient reports that he is in the process of scheduling a follow up appointment with a mental health provider.

## 2021-03-04 ENCOUNTER — Ambulatory Visit (INDEPENDENT_AMBULATORY_CARE_PROVIDER_SITE_OTHER): Payer: Self-pay | Admitting: Clinical

## 2021-03-04 ENCOUNTER — Other Ambulatory Visit: Payer: Self-pay

## 2021-03-04 DIAGNOSIS — F4323 Adjustment disorder with mixed anxiety and depressed mood: Secondary | ICD-10-CM

## 2021-03-05 NOTE — BH Specialist Note (Signed)
Integrated Behavioral Health via Telemedicine Visit  03/04/2021 Ryan Hughes [id number]  Number of Integrated Behavioral Health visits: 2/6 Session Start time: 1:45pm  Session End time: 2:15pm Total time: 30  Referring Provider: PCP Gwinda Passe, NP Patient/Family location: Home [facility] Provider location: [facility] All persons participating in visit: Pt and LCSWA Types of Service: Individual psychotherapy and Telephone visit (LCSWA started video visit with pt but it disconnected after 5 minutes, resumed via phone for 25 min)  I connected with [name] via [employer] Telemedicine Application  (Video is Caregility application) and verified that I am speaking with the correct person using two identifiers. Discussed confidentiality: Yes   I discussed the limitations of telemedicine and the availability of in person appointments.  Discussed there is a possibility of technology failure and discussed alternative modes of communication if that failure occurs.  I discussed that engaging in this telemedicine visit, they consent to the provision of behavioral healthcare and the services will be billed under their insurance.  Patient and/or legal guardian expressed understanding and consented to Telemedicine visit: Yes   Presenting Concerns: Patient and/or family reports the following symptoms/concerns: Reports feeling depressed, decreased interest in activities, trouble concentrating, decreased energy, anxiousness, worrying, and irritability. Reports that since having a stroke in 2021 he has felt depressed. Denies HI. Stated that he doesn't want to go to jail and that his brother "isn't worth it". Reports that he does not take his medication due to not liking it but reports that he wants to start taking it. Reports that he needs to apply for medicaid and appeal his disability.   Duration of problem: 1 year; Severity of problem: severe  Patient and/or Family's Strengths/Protective  Factors: Concrete supports in place (healthy food, safe environments, etc.)  Goals Addressed: Patient will:  Reduce symptoms of: agitation, anxiety, and depression   Increase knowledge and/or ability of: coping skills   Demonstrate ability to: Increase healthy adjustment to current life circumstances  Progress towards Goals: Ongoing  Interventions: Interventions utilized:  Mindfulness or Management consultant, CBT Cognitive Behavioral Therapy, Supportive Counseling, and Psychoeducation and/or Health Education Standardized Assessments completed: GAD-7 and PHQ 9  Patient and/or Family Response: Pt receptive to tx. Pt receptive to psychoeducation provided on depression and anxiety and its association with physical health. Pt receptive to continuing to adjust behavior with sitting on the porch for more sunlight. Pt receptive to cognitive restructuring and behavioral activation. Pt receptive to utilizing deep breathing exercises.   Assessment: Denies SI/HI. Pt stated that he doesn't want to go to jail and that his brother "isn't worth it". Pt went to [facility] after experiencing HI on 02/25/21. Denies auditory/visual hallucinations. Patient currently experiencing depression and anxiety as a result of physical health changes. Pt appears to have difficulty with cognitive processing skills as he experiences self-esteem problems. Pt's girlfriend was present with pt during session and appears to have difficulty with motivating pt. Pt expressed that he wants to improve his physical health. Pt does not take his medication and he currently doesn't have any. Pt also does not spend time outside unless he goes to the store.   Patient may benefit from assistance with cognitive processing and behavioral changes. LCSWA provided psychoeducation on depression and anxiety and its association with physical health. LCSWA utilized cognitive restructuring to decrease negative thoughts. LCSWA assisted pt with behavioral  activation and encouraged him to spend 15-30 mins on his porch daily. LCSWA scheduled fu with pt and pt will also pick his medication up when  he comes to appt. Pt is being referred to [facility] to assist with medicaid application and will be referred to legal aid for disability appeal. LCSWA will discuss psychiatry in next session.   Plan: Follow up with behavioral health clinician on : 03/24/21 Behavioral recommendations: Utilize deep breathing exercises and spend 15-30 mins on porch daily Referral(s): Integrated [employer] (In Clinic)  I discussed the assessment and treatment plan with the patient and/or parent/guardian. They were provided an opportunity to ask questions and all were answered. They agreed with the plan and demonstrated an understanding of the instructions.   They were advised to call back or seek an in-person evaluation if the symptoms worsen or if the condition fails to improve as anticipated.  Delita Chiquito C Sadako Cegielski, LCSW

## 2021-03-24 ENCOUNTER — Ambulatory Visit: Payer: Self-pay | Admitting: Clinical

## 2021-03-27 ENCOUNTER — Emergency Department (HOSPITAL_COMMUNITY): Payer: Medicaid Other

## 2021-03-27 ENCOUNTER — Encounter (HOSPITAL_COMMUNITY): Payer: Self-pay

## 2021-03-27 ENCOUNTER — Observation Stay (HOSPITAL_COMMUNITY)
Admission: EM | Admit: 2021-03-27 | Discharge: 2021-03-28 | Disposition: A | Discharge status: Home / Self Care | Payer: Medicaid Other | Attending: Internal Medicine | Admitting: Internal Medicine

## 2021-03-27 ENCOUNTER — Observation Stay (HOSPITAL_COMMUNITY): Payer: Medicaid Other

## 2021-03-27 DIAGNOSIS — E119 Type 2 diabetes mellitus without complications: Secondary | ICD-10-CM | POA: Diagnosis not present

## 2021-03-27 DIAGNOSIS — I639 Cerebral infarction, unspecified: Principal | ICD-10-CM | POA: Diagnosis present

## 2021-03-27 DIAGNOSIS — I10 Essential (primary) hypertension: Secondary | ICD-10-CM | POA: Diagnosis not present

## 2021-03-27 DIAGNOSIS — E1165 Type 2 diabetes mellitus with hyperglycemia: Secondary | ICD-10-CM

## 2021-03-27 DIAGNOSIS — Z794 Long term (current) use of insulin: Secondary | ICD-10-CM | POA: Insufficient documentation

## 2021-03-27 DIAGNOSIS — Z79899 Other long term (current) drug therapy: Secondary | ICD-10-CM | POA: Diagnosis not present

## 2021-03-27 DIAGNOSIS — F17210 Nicotine dependence, cigarettes, uncomplicated: Secondary | ICD-10-CM | POA: Insufficient documentation

## 2021-03-27 DIAGNOSIS — Z7984 Long term (current) use of oral hypoglycemic drugs: Secondary | ICD-10-CM | POA: Diagnosis not present

## 2021-03-27 DIAGNOSIS — R479 Unspecified speech disturbances: Secondary | ICD-10-CM | POA: Diagnosis present

## 2021-03-27 DIAGNOSIS — Z20822 Contact with and (suspected) exposure to covid-19: Secondary | ICD-10-CM | POA: Diagnosis not present

## 2021-03-27 DIAGNOSIS — E1159 Type 2 diabetes mellitus with other circulatory complications: Secondary | ICD-10-CM | POA: Diagnosis present

## 2021-03-27 HISTORY — DX: Cerebral infarction, unspecified: I63.9

## 2021-03-27 LAB — DIFFERENTIAL
Abs Immature Granulocytes: 0.02 10*3/uL (ref 0.00–0.07)
Basophils Absolute: 0 10*3/uL (ref 0.0–0.1)
Basophils Relative: 0 %
Eosinophils Absolute: 0 10*3/uL (ref 0.0–0.5)
Eosinophils Relative: 1 %
Immature Granulocytes: 0 %
Lymphocytes Relative: 47 %
Lymphs Abs: 3.6 10*3/uL (ref 0.7–4.0)
Monocytes Absolute: 0.3 10*3/uL (ref 0.1–1.0)
Monocytes Relative: 4 %
Neutro Abs: 3.7 10*3/uL (ref 1.7–7.7)
Neutrophils Relative %: 48 %

## 2021-03-27 LAB — COMPREHENSIVE METABOLIC PANEL
ALT: 12 U/L (ref 0–44)
AST: 24 U/L (ref 15–41)
Albumin: 4.2 g/dL (ref 3.5–5.0)
Alkaline Phosphatase: 87 U/L (ref 38–126)
Anion gap: 7 (ref 5–15)
BUN: 14 mg/dL (ref 6–20)
CO2: 25 mmol/L (ref 22–32)
Calcium: 9.4 mg/dL (ref 8.9–10.3)
Chloride: 101 mmol/L (ref 98–111)
Creatinine, Ser: 0.9 mg/dL (ref 0.61–1.24)
GFR, Estimated: >60 mL/min (ref >60)
Glucose, Bld: 431 mg/dL — ABNORMAL HIGH (ref 70–99)
Potassium: 5.2 mmol/L — ABNORMAL HIGH (ref 3.5–5.1)
Sodium: 133 mmol/L — ABNORMAL LOW (ref 135–145)
Total Bilirubin: 1.4 mg/dL — ABNORMAL HIGH (ref 0.3–1.2)
Total Protein: 7.6 g/dL (ref 6.5–8.1)

## 2021-03-27 LAB — URINALYSIS, ROUTINE W REFLEX MICROSCOPIC
Bacteria, UA: NONE SEEN
Bilirubin Urine: NEGATIVE
Glucose, UA: >=500 mg/dL — AB
Hgb urine dipstick: NEGATIVE
Ketones, ur: NEGATIVE mg/dL
Leukocytes,Ua: NEGATIVE
Nitrite: NEGATIVE
Protein, ur: NEGATIVE mg/dL
Specific Gravity, Urine: >1.046 — ABNORMAL HIGH (ref 1.005–1.030)
pH: 5 (ref 5.0–8.0)

## 2021-03-27 LAB — CBC
HCT: 44.4 % (ref 39.0–52.0)
Hemoglobin: 15.3 g/dL (ref 13.0–17.0)
MCH: 30.1 pg (ref 26.0–34.0)
MCHC: 34.5 g/dL (ref 30.0–36.0)
MCV: 87.4 fL (ref 80.0–100.0)
Platelets: 314 10*3/uL (ref 150–400)
RBC: 5.08 MIL/uL (ref 4.22–5.81)
RDW: 14 % (ref 11.5–15.5)
WBC: 7.7 10*3/uL (ref 4.0–10.5)
nRBC: 0 % (ref 0.0–0.2)

## 2021-03-27 LAB — RESP PANEL BY RT-PCR (FLU A&B, COVID) ARPGX2
Influenza A by PCR: NEGATIVE
Influenza B by PCR: NEGATIVE
SARS Coronavirus 2 by RT PCR: NEGATIVE

## 2021-03-27 LAB — I-STAT CHEM 8, ED
BUN: 14 mg/dL (ref 6–20)
Calcium, Ion: 1.23 mmol/L (ref 1.15–1.40)
Chloride: 99 mmol/L (ref 98–111)
Creatinine, Ser: 0.8 mg/dL (ref 0.61–1.24)
Glucose, Bld: 399 mg/dL — ABNORMAL HIGH (ref 70–99)
HCT: 43 % (ref 39.0–52.0)
Hemoglobin: 14.6 g/dL (ref 13.0–17.0)
Potassium: 4 mmol/L (ref 3.5–5.1)
Sodium: 137 mmol/L (ref 135–145)
TCO2: 28 mmol/L (ref 22–32)

## 2021-03-27 LAB — GLUCOSE, CAPILLARY: Glucose-Capillary: 277 mg/dL — ABNORMAL HIGH (ref 70–99)

## 2021-03-27 LAB — RAPID URINE DRUG SCREEN, HOSP PERFORMED
Amphetamines: NOT DETECTED
Barbiturates: NOT DETECTED
Benzodiazepines: NOT DETECTED
Cocaine: NOT DETECTED
Opiates: NOT DETECTED
Tetrahydrocannabinol: POSITIVE — AB

## 2021-03-27 LAB — CBG MONITORING, ED: Glucose-Capillary: 423 mg/dL — ABNORMAL HIGH (ref 70–99)

## 2021-03-27 LAB — ETHANOL: Alcohol, Ethyl (B): <10 mg/dL (ref <10)

## 2021-03-27 IMAGING — MR MR HEAD W/O CM
9 of 10 series · 40 of 48 positions shown · non-contrast
Comparison: [DATE]

CLINICAL DATA: The acute neurologic deficit

EXAM:
MRI HEAD WITHOUT CONTRAST
TECHNIQUE: Multiplanar, multiecho pulse sequences of the brain and surrounding
structures were obtained without intravenous contrast.

[Series 5: dwi_tracew · axial · 3.0mm · 1.08mm/px · z∈[-61,+79]mm · 7 of 108 slices shown]
[im 1/108]
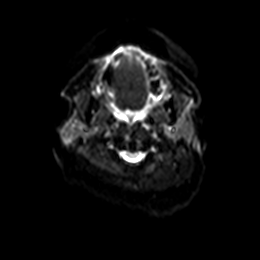
[im 12/108]
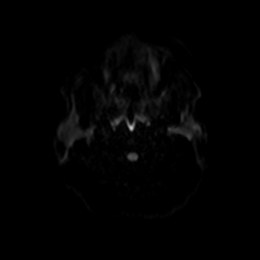
[im 36/108]
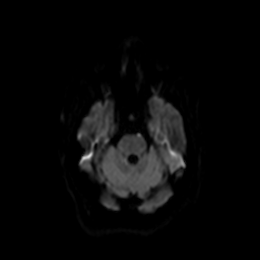
[im 48/108]
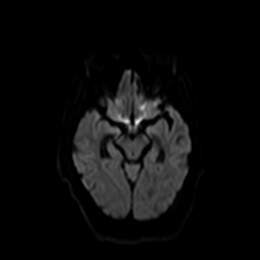
[im 60/108]
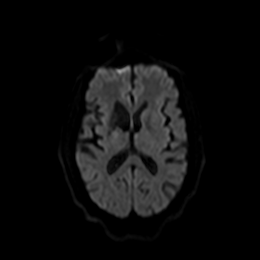
[im 72/108]
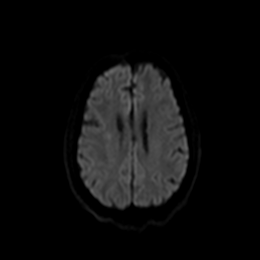
[im 96/108]
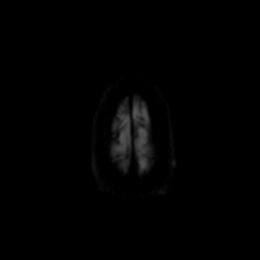

[Series 7: T2 · sagittal · 5.0mm · 0.47mm/px · 2 of 28 slices shown (1 of 3)]
[im 1/28]
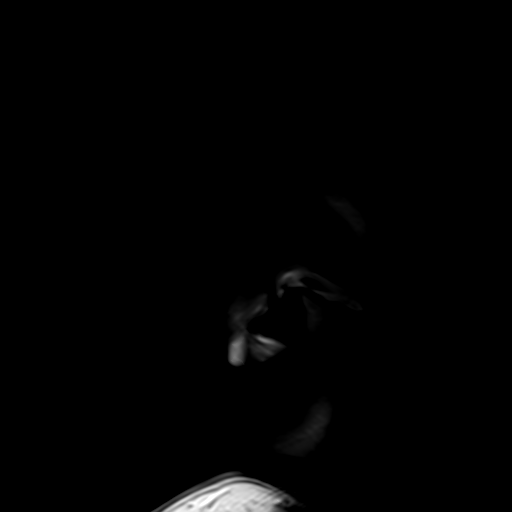
[im 28/28]
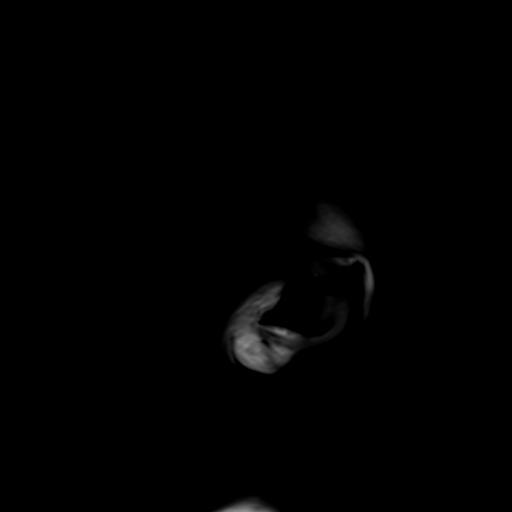

[Series 8: T2 · axial · 5.0mm · 0.45mm/px · z∈[-128,+176]mm · 4 of 48 slices shown (2 of 3)]
[im 1/48]
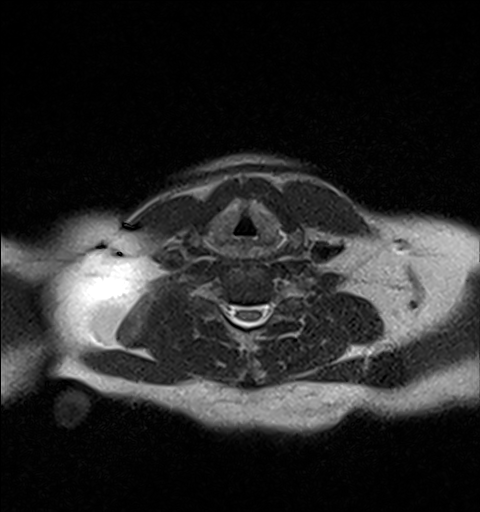
[im 16/48]
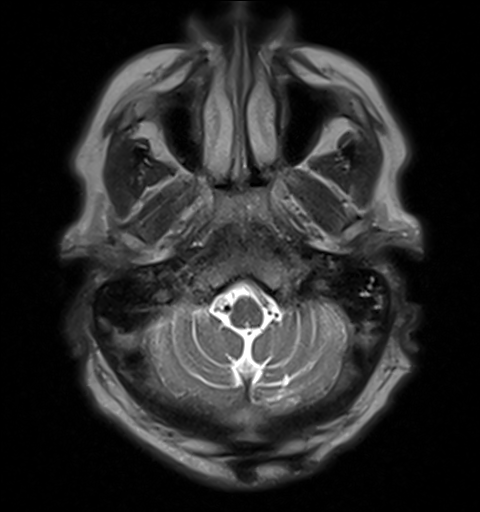
[im 32/48]
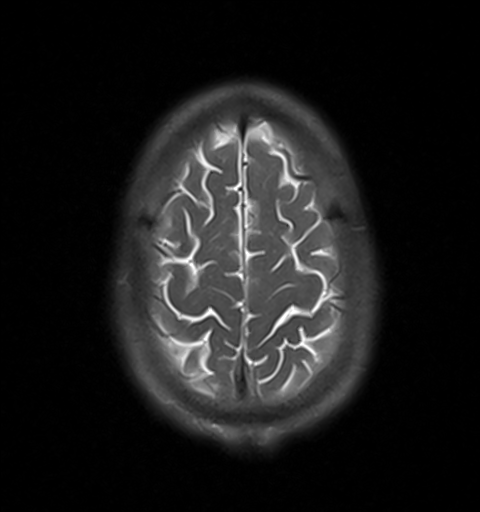
[im 48/48]
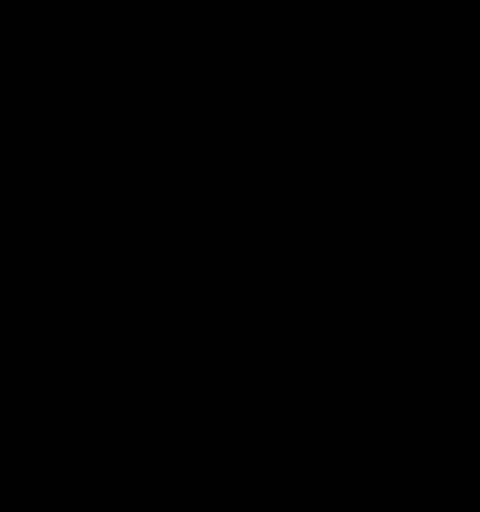

[Series 9: GRE · axial · 3.0mm · 0.45mm/px · z∈[-65,+102]mm · 5 of 57 slices shown]
[im 1/57]
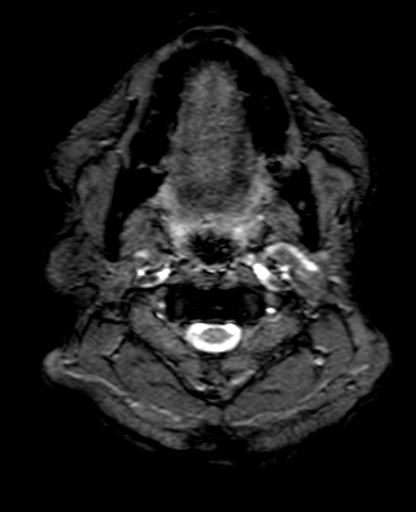
[im 15/57]
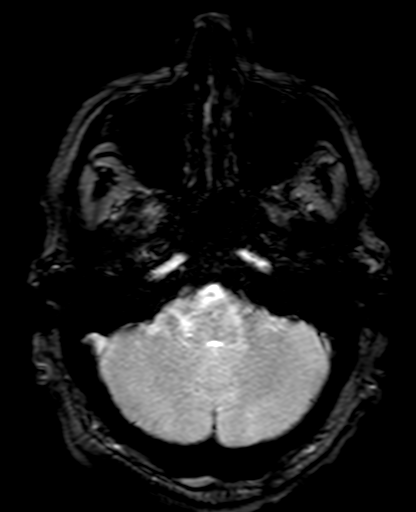
[im 29/57]
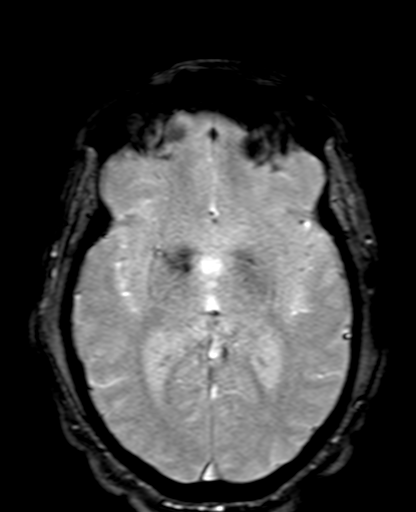
[im 43/57]
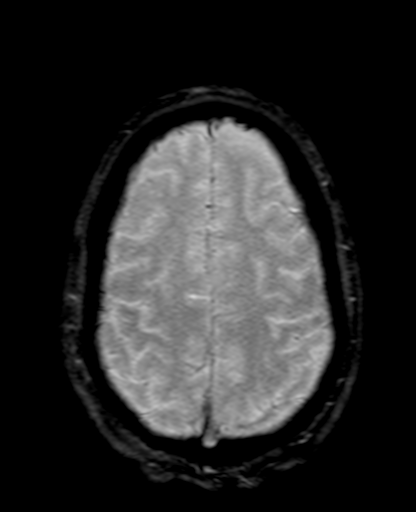
[im 57/57]
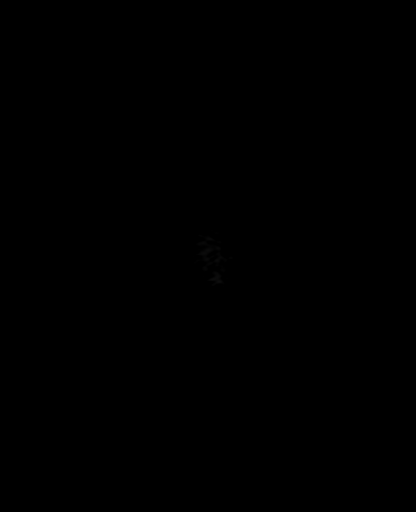

[Series 10: FLAIR · axial · 3.0mm · 0.90mm/px · z∈[-60,+107]mm · 5 of 57 slices shown]
[im 1/57]
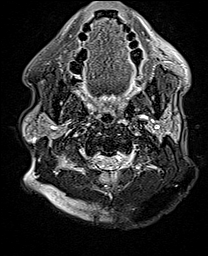
[im 15/57]
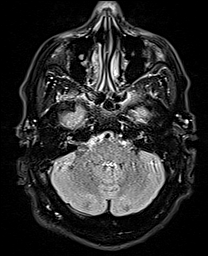
[im 29/57]
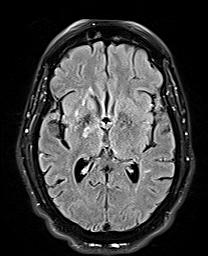
[im 43/57]
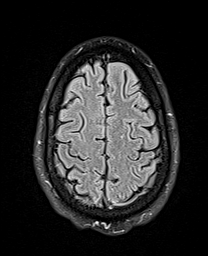
[im 57/57]
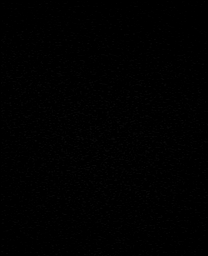

[Series 11: T1 · axial · 3.0mm · 0.45mm/px · z∈[-71,+96]mm · 5 of 57 slices shown]
[im 1/57]
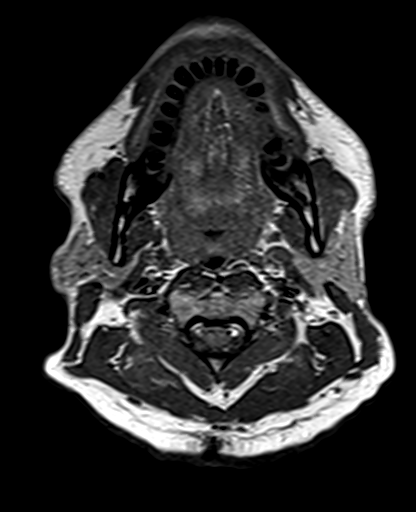
[im 15/57]
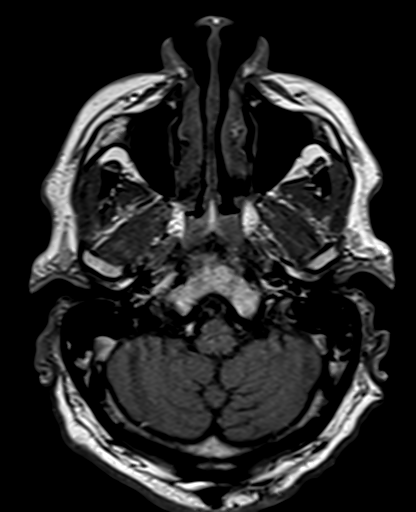
[im 29/57]
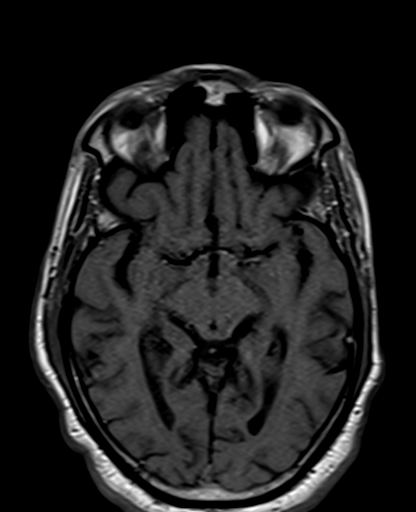
[im 43/57]
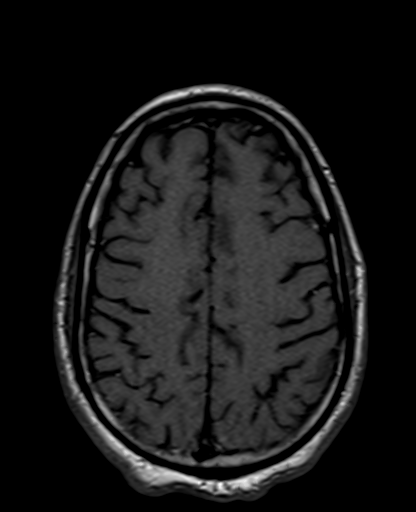
[im 57/57]
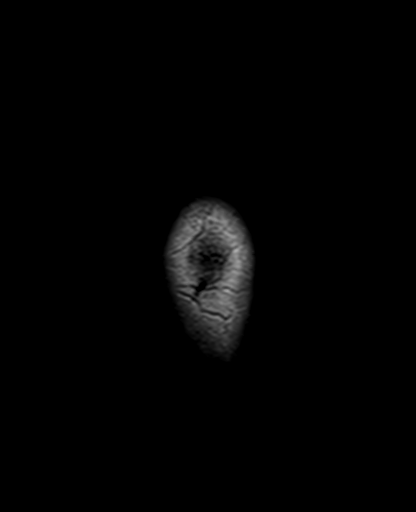

[Series 12: DWI · coronal · 5.0mm · 1.31mm/px · 6 of 68 slices shown (1 of 2)]
[im 1/68]
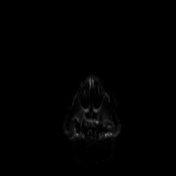
[im 14/68]
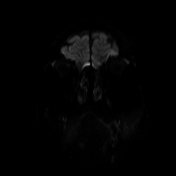
[im 27/68]
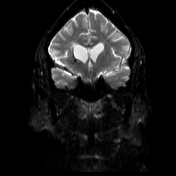
[im 41/68]
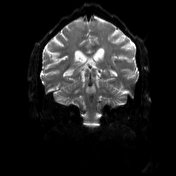
[im 54/68]
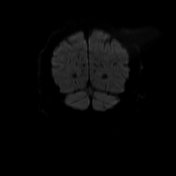
[im 68/68]
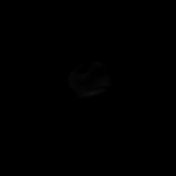

[Series 13: DWI · coronal · 5.0mm · 1.31mm/px · 3 of 34 slices shown (2 of 2)]
[im 1/34]
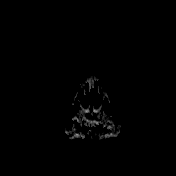
[im 17/34]
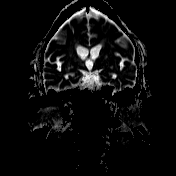
[im 34/34]
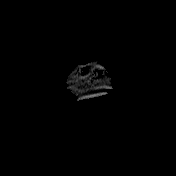

[Series 14: T2 · coronal · 5.0mm · 0.86mm/px · 3 of 35 slices shown (3 of 3)]
[im 1/35]
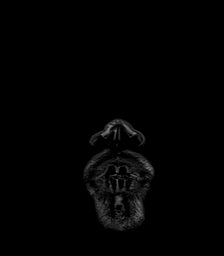
[im 18/35]
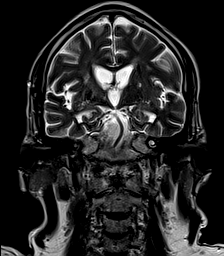
[im 35/35]
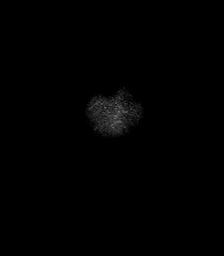

[40 of 48 positions shown; findings below may reference images not displayed]

FINDINGS: Brain: Small acute infarct along the posterior limb of the right
internal capsule. No acute or chronic hemorrhage. There is
multifocal hyperintense T2-weighted signal within the white matter.
Generalized volume loss without a clear lobar predilection. Old
right basal ganglia infarct with ex vacuo dilatation of the frontal
horn of the right lateral ventricle. The midline structures are
normal.

Vascular: Major flow voids are preserved.

Skull and upper cervical spine: Normal calvarium and skull base.
Visualized upper cervical spine and soft tissues are normal.

Sinuses/Orbits:No paranasal sinus fluid levels or advanced mucosal
thickening. No mastoid or middle ear effusion. Normal orbits.
IMPRESSION: Small acute infarct along the posterior limb of the right internal
capsule. No hemorrhage or mass effect.

## 2021-03-27 MED ORDER — ACETAMINOPHEN 650 MG RE SUPP: 650.0000 mg | RECTAL | Status: DC | PRN

## 2021-03-27 MED ORDER — FOLIC ACID 1 MG PO TABS
1.0000 mg | ORAL_TABLET | Freq: Every day | ORAL | Status: DC
  Administered 2021-03-28: 1 mg via ORAL
  Filled 2021-03-27: qty 1

## 2021-03-27 MED ORDER — INSULIN GLARGINE-YFGN 100 UNIT/ML ~~LOC~~ SOLN
15.0000 [IU] | Freq: Every day | SUBCUTANEOUS | Status: DC
  Administered 2021-03-27: 15 [IU] via SUBCUTANEOUS
  Filled 2021-03-27 (×2): qty 0.15

## 2021-03-27 MED ORDER — INSULIN ASPART 100 UNIT/ML IJ SOLN
0.0000 [IU] | Freq: Three times a day (TID) | INTRAMUSCULAR | Status: DC
  Administered 2021-03-28 (×2): 5 [IU] via SUBCUTANEOUS
  Filled 2021-03-27: qty 0.09

## 2021-03-27 MED ORDER — THIAMINE HCL 100 MG PO TABS
100.0000 mg | ORAL_TABLET | Freq: Every day | ORAL | Status: DC
  Administered 2021-03-28: 100 mg via ORAL
  Filled 2021-03-27: qty 1

## 2021-03-27 MED ORDER — CLOPIDOGREL BISULFATE 75 MG PO TABS
75.0000 mg | ORAL_TABLET | Freq: Every day | ORAL | Status: DC
  Administered 2021-03-27 – 2021-03-28 (×2): 75 mg via ORAL
  Filled 2021-03-27 (×2): qty 1

## 2021-03-27 MED ORDER — HYDRALAZINE HCL 20 MG/ML IJ SOLN: 10.0000 mg | INTRAMUSCULAR | Status: DC | PRN

## 2021-03-27 MED ORDER — THIAMINE HCL 100 MG/ML IJ SOLN: 100.0000 mg | Freq: Every day | INTRAMUSCULAR | Status: DC

## 2021-03-27 MED ORDER — ADULT MULTIVITAMIN W/MINERALS CH
1.0000 | ORAL_TABLET | Freq: Every day | ORAL | Status: DC
  Administered 2021-03-28: 1 via ORAL
  Filled 2021-03-27: qty 1

## 2021-03-27 MED ORDER — ACETAMINOPHEN 325 MG PO TABS: 650.0000 mg | ORAL_TABLET | ORAL | Status: DC | PRN

## 2021-03-27 MED ORDER — FONDAPARINUX SODIUM 2.5 MG/0.5ML ~~LOC~~ SOLN
2.5000 mg | SUBCUTANEOUS | Status: DC
  Administered 2021-03-27: 2.5 mg via SUBCUTANEOUS
  Filled 2021-03-27 (×2): qty 0.5

## 2021-03-27 MED ORDER — STROKE: EARLY STAGES OF RECOVERY BOOK
Freq: Once | Status: AC
  Filled 2021-03-27 (×2): qty 1

## 2021-03-27 MED ORDER — ATORVASTATIN CALCIUM 80 MG PO TABS
80.0000 mg | ORAL_TABLET | Freq: Every day | ORAL | Status: DC
  Administered 2021-03-27: 80 mg via ORAL
  Filled 2021-03-27: qty 1

## 2021-03-27 MED ORDER — ACETAMINOPHEN 160 MG/5ML PO SOLN: 650.0000 mg | ORAL | Status: DC | PRN

## 2021-03-27 MED ORDER — ASPIRIN 325 MG PO TABS
325.0000 mg | ORAL_TABLET | Freq: Every day | ORAL | Status: DC
  Administered 2021-03-27 – 2021-03-28 (×2): 325 mg via ORAL
  Filled 2021-03-27 (×2): qty 1

## 2021-03-27 MED ORDER — IOHEXOL 350 MG/ML SOLN
100.0000 mL | Freq: Once | INTRAVENOUS | Status: AC | PRN
  Administered 2021-03-27: 100 mL via INTRAVENOUS

## 2021-03-27 MED ORDER — SODIUM CHLORIDE 0.9 % IV SOLN: INTRAVENOUS | Status: DC

## 2021-03-27 MED ORDER — ASPIRIN 300 MG RE SUPP: 300.0000 mg | Freq: Every day | RECTAL | Status: DC

## 2021-03-27 NOTE — ED Triage Notes (Signed)
Patient BIB family member. Family says that patient has history of stroke. Family was unable to contact patient today, went by his home where he lives with other family members. Family member at bedside was told that patient has new onset slurred speech and weakness. Reported to have last been seen well last night but unsure of the time. Patient denies pain. Says he has tingling in his right fingers but has had this since previous stroke. Denies pain in triage.

## 2021-03-27 NOTE — ED Notes (Signed)
Patient transported to MRI scan

## 2021-03-27 NOTE — H&P (Signed)
History and Physical    Ryan Hughes YSA:[id number] DOB: 02/20/1966 DOA: 03/27/2021  PCP: [name], NP  Patient coming from: Home.  Chief Complaint: Difficulty speaking.  Most of the history was obtained from ER physician.  HPI: Ryan Hughes is a 55 y.o. male with prior history of stroke, diabetes mellitus, hypertension, depression, substance abuse was brought to the ER after patient was found to have difficulty speaking and also left facial droop and left-sided weakness.  It is not known when the symptoms started.  I did discuss with patient's niece who is not completely aware of her symptoms but did advise me to contact another family member but was unable to reach other family member.  ED Course: In the ER patient was evaluated by neurologist had CT head followed by CT angiogram of the head and neck which did not show any large vessel obstruction did show 50% stenosis of the mid right common carotid artery.  MRI of the brain did show acute stroke on the right internal capsule.  On exam patient does have left facial droop and mild weakness of the left lower extremity.  Patient did pass stroke swallow.  Patient appears mildly confused.  EKG shows normal sinus rhythm.  COVID test negative.  Labs show blood glucose of 431 anion gap 7 sodium 133 potassium 5.2 endocrine positive for marijuana.  Review of Systems: As per HPI, rest all negative.   Past Medical History:  Diagnosis Date   Diabetes mellitus without complication ([place])    Hypertension    Nicotine dependence, cigarettes, uncomplicated 32/35/5732    History reviewed. No pertinent surgical history.   reports that he has been smoking cigarettes. He has been smoking an average of .5 packs per day. He has never used smokeless tobacco. He reports current alcohol use. He reports current drug use. Drug: Marijuana.  Allergies  Allergen Reactions   Pork-Derived Products Other (See Comments)    Causes gout flares    Shellfish-Derived Products Other (See Comments)    Causes gout flares    Family History  Problem Relation Age of Onset   Stroke Mother     Prior to Admission medications   Medication Sig Start Date End Date Taking? Authorizing Provider  atorvastatin (LIPITOR) 40 MG tablet TAKE 1 TABLET (40 MG TOTAL) BY MOUTH DAILY. Patient not taking: Reported on 03/27/2021 05/21/20 05/21/21  [name], NP  blood glucose meter kit and supplies KIT Dispense based on patient and insurance preference. Use up to four times daily as directed. (FOR ICD-9 250.00, 250.01). 04/27/20   Dwyane Dee, MD  Blood Glucose Monitoring Suppl (TRUE METRIX METER) w/Device KIT USE UP TO 4 TIMES DAILY AS DIRECTED 04/27/20 04/27/21  Dwyane Dee, MD  glipiZIDE (GLUCOTROL) 10 MG tablet TAKE 1 TABLET (10 MG TOTAL) BY MOUTH 2 (TWO) TIMES DAILY BEFORE A MEAL. Patient not taking: Reported on 03/27/2021 05/21/20 05/21/21  [name], NP  glucose blood test strip USE UP TO FOUR TIMES DAILY AS DIRECTED 04/27/20 04/27/21  Dwyane Dee, MD  Insulin Glargine (BASAGLAR KWIKPEN) 100 UNIT/ML INJECT 20 UNITS INTO THE SKIN DAILY. Patient not taking: Reported on 03/27/2021 05/21/20 05/21/21  [name], NP  insulin glargine (LANTUS) 100 UNIT/ML Solostar Pen Inject 20 Units into the skin daily. Patient not taking: Reported on 03/27/2021 05/21/20   [name], NP  insulin lispro (HUMALOG) 100 UNIT/ML KwikPen INJECT 2-15 UNITS INTO THE SKIN 4 (FOUR) TIMES DAILY - BEFORE MEALS AND AT BEDTIME.  Patient not taking: Reported on 03/27/2021 07/01/20 07/01/21  Charlott Rakes, MD  Insulin Pen Needle 31G X 5 MM MISC USE AS DIRECTED IN THE MORNING, NOON, EVENING AND BEDTIME 05/21/20 05/21/21  [name], NP  lisinopril (ZESTRIL) 20 MG tablet TAKE 1 TABLET (20 MG TOTAL) BY MOUTH DAILY. Patient not taking: Reported on 03/27/2021 05/21/20 05/21/21  [name], NP  metFORMIN (GLUCOPHAGE) 1000 MG tablet TAKE 1  TABLET (1,000 MG TOTAL) BY MOUTH 2 (TWO) TIMES DAILY WITH A MEAL. Patient not taking: Reported on 03/27/2021 05/21/20 05/21/21  [name], NP  metoCLOPramide (REGLAN) 10 MG tablet Take 1 tablet (10 mg total) by mouth every 8 (eight) hours as needed for up to 5 days for refractory nausea / vomiting. If not helped with zofran Patient not taking: Reported on 03/27/2021 08/07/20 03/28/21  Flora Lipps, MD  metoCLOPramide (REGLAN) 10 MG tablet Take 1 tablet (10 mg total) by mouth every 8 (eight) hours for 5 days. for refractory nausea/vomiting. Patient not taking: Reported on 03/27/2021 08/10/20 03/28/21  Pokhrel, Corrie Mckusick, MD  pantoprazole (PROTONIX) 40 MG tablet TAKE 1 TABLET (40 MG TOTAL) BY MOUTH DAILY. Patient not taking: Reported on 03/27/2021 08/07/20 08/07/21  Pokhrel, Corrie Mckusick, MD  tamsulosin (FLOMAX) 0.4 MG CAPS capsule TAKE 1 CAPSULE (0.4 MG TOTAL) BY MOUTH DAILY AFTER BREAKFAST. Patient not taking: Reported on 03/27/2021 05/21/20 05/21/21  [name], NP  TRUE [facility] BLOOD GLUCOSE TEST test strip  04/27/20   [provider]  TRUEplus Lancets 28G MISC USE UP TO 4 TIMES DAILY AS DIRECTED 04/27/20 04/27/21  Dwyane Dee, MD    Physical Exam: Constitutional: Moderately built and nourished. Vitals:   03/27/21 1730 03/27/21 1736 03/27/21 1844 03/27/21 1956  BP: (!) 145/86  (!) 161/97 (!) 172/96  Pulse: 87  73 71  Resp:   18 18  Temp:   98.2 F (36.8 C) 98.5 F (36.9 C)  TempSrc:   Oral Oral  SpO2: 98%  99% 99%  Weight:  85.7 kg    Height:  _0  (1.702 m)     Eyes: Anicteric no pallor. ENMT: No discharge from the ears eyes nose and mouth. Neck: No mass felt.  No neck rigidity. Respiratory: No rhonchi or crepitations. Cardiovascular: S1-S2 heard. Abdomen: Soft nontender bowel sound present. Musculoskeletal: No edema. Skin: No rash. Neurologic: Alert awake oriented to his name and place appears mildly confused mild weakness of the left lower extremity rest of  extremities are 5 x 5 left facial droop pupils equal reacting to light tongue is midline. Psychiatric: Mildly confused.   Labs on Admission: I have personally reviewed following labs and imaging studies  CBC: Recent Labs  Lab 03/27/21 1800 03/27/21 1840  WBC 7.7  --   NEUTROABS 3.7  --   HGB 15.3 14.6  HCT 44.4 43.0  MCV 87.4  --   PLT 314  --    Basic Metabolic Panel: Recent Labs  Lab 03/27/21 1800 03/27/21 1840  NA 133* 137  K 5.2* 4.0  CL 101 99  CO2 25  --   GLUCOSE 431* 399*  BUN 14 14  CREATININE 0.90 0.80  CALCIUM 9.4  --    GFR: Estimated Creatinine Clearance: 107.8 mL/min (by C-G formula based on SCr of 0.8 mg/dL). Liver Function Tests: Recent Labs  Lab 03/27/21 1800  AST 24  ALT 12  ALKPHOS 87  BILITOT 1.4*  PROT 7.6  ALBUMIN 4.2   No results for input(s): LIPASE, AMYLASE  in the last 168 hours. No results for input(s): AMMONIA in the last 168 hours. Coagulation Profile: No results for input(s): INR, PROTIME in the last 168 hours. Cardiac Enzymes: No results for input(s): CKTOTAL, CKMB, CKMBINDEX, TROPONINI in the last 168 hours. BNP (last 3 results) No results for input(s): PROBNP in the last 8760 hours. HbA1C: No results for input(s): HGBA1C in the last 72 hours. CBG: Recent Labs  Lab 03/27/21 1736  GLUCAP 423*   Lipid Profile: No results for input(s): CHOL, HDL, LDLCALC, TRIG, CHOLHDL, LDLDIRECT in the last 72 hours. Thyroid Function Tests: No results for input(s): TSH, T4TOTAL, FREET4, T3FREE, THYROIDAB in the last 72 hours. Anemia Panel: No results for input(s): VITAMINB12, FOLATE, FERRITIN, TIBC, IRON, RETICCTPCT in the last 72 hours. Urine analysis:    Component Value Date/Time   COLORURINE YELLOW 03/27/2021 2005   APPEARANCEUR CLEAR 03/27/2021 2005   LABSPEC >1.046 (H) 03/27/2021 2005   PHURINE 5.0 03/27/2021 2005   GLUCOSEU >=500 (A) 03/27/2021 2005   HGBUR NEGATIVE 03/27/2021 2005   BILIRUBINUR NEGATIVE 03/27/2021 2005    BILIRUBINUR negative 12/31/2020 [place] 03/27/2021 2005   PROTEINUR NEGATIVE 03/27/2021 2005   UROBILINOGEN 0.2 12/31/2020 1548   UROBILINOGEN 0.2 07/04/2010 0959   NITRITE NEGATIVE 03/27/2021 2005   LEUKOCYTESUR NEGATIVE 03/27/2021 2005   Sepsis Labs: _0 (procalcitonin:4,lacticidven:4) ) Recent Results (from the past 240 hour(s))  Resp Panel by RT-PCR (Flu A&B, Covid) Nasopharyngeal Swab     Status: None   Collection Time: 03/27/21  6:30 PM   Specimen: Nasopharyngeal Swab; Nasopharyngeal(NP) swabs in vial transport medium  Result Value Ref Range Status   SARS Coronavirus 2 by RT PCR NEGATIVE NEGATIVE Final    Comment: (NOTE) SARS-CoV-2 target nucleic acids are NOT DETECTED.  The SARS-CoV-2 RNA is generally detectable in upper respiratory specimens during the acute phase of infection. The lowest concentration of SARS-CoV-2 viral copies this assay can detect is 138 copies/mL. A negative result does not preclude SARS-Cov-2 infection and should not be used as the sole basis for treatment or other patient management decisions. A negative result may occur with  improper specimen collection/handling, submission of specimen other than nasopharyngeal swab, presence of viral mutation(s) within the areas targeted by this assay, and inadequate number of viral copies(<138 copies/mL). A negative result must be combined with clinical observations, patient history, and epidemiological information. The expected result is Negative.  Fact Sheet for Patients:  EntrepreneurPulse.com.au  Fact Sheet for Healthcare Providers:  IncredibleEmployment.be  This test is no t yet approved or cleared by the [place] FDA and  has been authorized for detection and/or diagnosis of SARS-CoV-2 by FDA under an Emergency Use Authorization (EUA). This EUA will remain  in effect (meaning this test can be used) for the duration of the COVID-19  declaration under Section 564(b)(1) of the Act, 21 U.S.C.section 360bbb-3(b)(1), unless the authorization is terminated  or revoked sooner.       Influenza A by PCR NEGATIVE NEGATIVE Final   Influenza B by PCR NEGATIVE NEGATIVE Final    Comment: (NOTE) The Xpert Xpress SARS-CoV-2/FLU/RSV plus assay is intended as an aid in the diagnosis of influenza from Nasopharyngeal swab specimens and should not be used as a sole basis for treatment. Nasal washings and aspirates are unacceptable for Xpert Xpress SARS-CoV-2/FLU/RSV testing.  Fact Sheet for Patients: EntrepreneurPulse.com.au  Fact Sheet for Healthcare Providers: IncredibleEmployment.be  This test is not yet approved or cleared by the [place] and has been authorized  for detection and/or diagnosis of SARS-CoV-2 by FDA under an Emergency Use Authorization (EUA). This EUA will remain in effect (meaning this test can be used) for the duration of the COVID-19 declaration under Section 564(b)(1) of the Act, 21 U.S.C. section 360bbb-3(b)(1), unless the authorization is terminated or revoked.  Performed at [facility], [place] [address] [place], [place] [address]      Radiological Exams on Admission: CT Angio Head W or Wo Contrast  Result Date: 03/27/2021 CLINICAL DATA:  Aphasia, headache EXAM: CT ANGIOGRAPHY HEAD AND NECK CT PERFUSION BRAIN TECHNIQUE: Multidetector CT imaging of the head and neck was performed using the standard protocol during bolus administration of intravenous contrast. Multiplanar CT image reconstructions and MIPs were obtained to evaluate the vascular anatomy. Carotid stenosis measurements (when applicable) are obtained utilizing NASCET criteria, using the distal internal carotid diameter as the denominator. Multiphase CT imaging of the brain was performed following IV bolus contrast injection. Subsequent parametric perfusion maps were calculated using  RAPID software. CONTRAST:  158m OMNIPAQUE IOHEXOL 350 MG/ML SOLN COMPARISON:  Same-day noncontrast CT head, CT/CTA head and neck 04/25/2020 FINDINGS: CTA NECK FINDINGS Aortic arch: There is mild calcified atherosclerotic plaque in the aortic arch. The left vertebral artery arises directly from the aortic arch, a normal variant. The origins of the major vessels are patent. Right carotid system: There is primarily soft plaque in the right common carotid artery resulting approximately 50% stenosis. The distal right common carotid artery is patent. There is mild soft and calcified atherosclerotic plaque in the proximal right internal carotid artery without hemodynamically significant stenosis or occlusion. There is no dissection or aneurysm. Left carotid system: The left common carotid artery is patent. There is mild soft and calcified atherosclerotic plaque in the proximal left internal carotid artery without hemodynamically significant stenosis or occlusion. There is no dissection or aneurysm. There is mild stenosis at the origin of the right external carotid artery. Vertebral arteries: The vertebral arteries are patent, without hemodynamically significant stenosis, occlusion, dissection, or aneurysm. Skeleton: There is no significant degenerative change in the cervical spine. There is no visible canal hematoma. Other neck: The soft tissues are unremarkable. There is extensive dental disease. Upper chest: The imaged lung apices are clear. Review of the MIP images confirms the above findings CTA HEAD FINDINGS Anterior circulation: There is mild calcified atherosclerotic plaque in the bilateral intracranial ICAs without hemodynamically significant stenosis or occlusion. The bilateral MCAs are patent. The bilateral ACAs are patent. There is no aneurysm. Posterior circulation: The bilateral V4 segments are patent. The basilar artery is patent. Bilateral posterior communicating arteries are identified. The bilateral PCAs  are patent. There is no aneurysm. Venous sinuses: Patent. Anatomic variants: None. Review of the MIP images confirms the above findings CT Brain Perfusion Findings: CBF (<30%) Volume: 052mPerfusion (Tmax>6.0s) volume: 83m483mismatch Volume: N/amL Infarction Location:N/a IMPRESSION: 1. No large vessel occlusion. 2. No infarct core or penumbra identified. 3. Soft plaque resulting in approximately 50% stenosis in the mid right common carotid artery. Mild calcified atherosclerotic plaque in the proximal internal carotid arteries bilaterally without hemodynamically significant stenosis. 4. Minimal calcified atherosclerotic plaque in the bilateral cavernous ICAs without hemodynamically significant stenosis. Otherwise, patent vasculature of the head and neck. 5. Extensive dental disease. These results were paged via AMION at the time of interpretation on 03/27/2021 at 6:22 pm to provider Dr AroRory Percywho verbally acknowledged these results. Electronically Signed   By: PetValetta MoleD.   On: 03/27/2021 18:35  CT Angio Neck W and/or Wo Contrast  Result Date: 03/27/2021 CLINICAL DATA:  Aphasia, headache EXAM: CT ANGIOGRAPHY HEAD AND NECK CT PERFUSION BRAIN TECHNIQUE: Multidetector CT imaging of the head and neck was performed using the standard protocol during bolus administration of intravenous contrast. Multiplanar CT image reconstructions and MIPs were obtained to evaluate the vascular anatomy. Carotid stenosis measurements (when applicable) are obtained utilizing NASCET criteria, using the distal internal carotid diameter as the denominator. Multiphase CT imaging of the brain was performed following IV bolus contrast injection. Subsequent parametric perfusion maps were calculated using RAPID software. CONTRAST:  129m OMNIPAQUE IOHEXOL 350 MG/ML SOLN COMPARISON:  Same-day noncontrast CT head, CT/CTA head and neck 04/25/2020 FINDINGS: CTA NECK FINDINGS Aortic arch: There is mild calcified atherosclerotic plaque in the  aortic arch. The left vertebral artery arises directly from the aortic arch, a normal variant. The origins of the major vessels are patent. Right carotid system: There is primarily soft plaque in the right common carotid artery resulting approximately 50% stenosis. The distal right common carotid artery is patent. There is mild soft and calcified atherosclerotic plaque in the proximal right internal carotid artery without hemodynamically significant stenosis or occlusion. There is no dissection or aneurysm. Left carotid system: The left common carotid artery is patent. There is mild soft and calcified atherosclerotic plaque in the proximal left internal carotid artery without hemodynamically significant stenosis or occlusion. There is no dissection or aneurysm. There is mild stenosis at the origin of the right external carotid artery. Vertebral arteries: The vertebral arteries are patent, without hemodynamically significant stenosis, occlusion, dissection, or aneurysm. Skeleton: There is no significant degenerative change in the cervical spine. There is no visible canal hematoma. Other neck: The soft tissues are unremarkable. There is extensive dental disease. Upper chest: The imaged lung apices are clear. Review of the MIP images confirms the above findings CTA HEAD FINDINGS Anterior circulation: There is mild calcified atherosclerotic plaque in the bilateral intracranial ICAs without hemodynamically significant stenosis or occlusion. The bilateral MCAs are patent. The bilateral ACAs are patent. There is no aneurysm. Posterior circulation: The bilateral V4 segments are patent. The basilar artery is patent. Bilateral posterior communicating arteries are identified. The bilateral PCAs are patent. There is no aneurysm. Venous sinuses: Patent. Anatomic variants: None. Review of the MIP images confirms the above findings CT Brain Perfusion Findings: CBF (<30%) Volume: 058mPerfusion (Tmax>6.0s) volume: 83m91mismatch  Volume: N/amL Infarction Location:N/a IMPRESSION: 1. No large vessel occlusion. 2. No infarct core or penumbra identified. 3. Soft plaque resulting in approximately 50% stenosis in the mid right common carotid artery. Mild calcified atherosclerotic plaque in the proximal internal carotid arteries bilaterally without hemodynamically significant stenosis. 4. Minimal calcified atherosclerotic plaque in the bilateral cavernous ICAs without hemodynamically significant stenosis. Otherwise, patent vasculature of the head and neck. 5. Extensive dental disease. These results were paged via AMION at the time of interpretation on 03/27/2021 at 6:22 pm to provider Dr AroRory Percywho verbally acknowledged these results. Electronically Signed   By: PetValetta MoleD.   On: 03/27/2021 18:35   MR Brain Wo Contrast (neuro protocol)  Result Date: 03/27/2021 CLINICAL DATA:  The acute neurologic deficit EXAM: MRI HEAD WITHOUT CONTRAST TECHNIQUE: Multiplanar, multiecho pulse sequences of the brain and surrounding structures were obtained without intravenous contrast. COMPARISON:  04/24/2020 FINDINGS: Brain: Small acute infarct along the posterior limb of the right internal capsule. No acute or chronic hemorrhage. There is multifocal hyperintense T2-weighted signal within the white matter. Generalized  volume loss without a clear lobar predilection. Old right basal ganglia infarct with ex vacuo dilatation of the frontal horn of the right lateral ventricle. The midline structures are normal. Vascular: Major flow voids are preserved. Skull and upper cervical spine: Normal calvarium and skull base. Visualized upper cervical spine and soft tissues are normal. Sinuses/Orbits:No paranasal sinus fluid levels or advanced mucosal thickening. No mastoid or middle ear effusion. Normal orbits. IMPRESSION: Small acute infarct along the posterior limb of the right internal capsule. No hemorrhage or mass effect. Electronically Signed   By: Ulyses Jarred  M.D.   On: 03/27/2021 20:33   CT CEREBRAL PERFUSION W CONTRAST  Result Date: 03/27/2021 CLINICAL DATA:  Aphasia, headache EXAM: CT ANGIOGRAPHY HEAD AND NECK CT PERFUSION BRAIN TECHNIQUE: Multidetector CT imaging of the head and neck was performed using the standard protocol during bolus administration of intravenous contrast. Multiplanar CT image reconstructions and MIPs were obtained to evaluate the vascular anatomy. Carotid stenosis measurements (when applicable) are obtained utilizing NASCET criteria, using the distal internal carotid diameter as the denominator. Multiphase CT imaging of the brain was performed following IV bolus contrast injection. Subsequent parametric perfusion maps were calculated using RAPID software. CONTRAST:  173m OMNIPAQUE IOHEXOL 350 MG/ML SOLN COMPARISON:  Same-day noncontrast CT head, CT/CTA head and neck 04/25/2020 FINDINGS: CTA NECK FINDINGS Aortic arch: There is mild calcified atherosclerotic plaque in the aortic arch. The left vertebral artery arises directly from the aortic arch, a normal variant. The origins of the major vessels are patent. Right carotid system: There is primarily soft plaque in the right common carotid artery resulting approximately 50% stenosis. The distal right common carotid artery is patent. There is mild soft and calcified atherosclerotic plaque in the proximal right internal carotid artery without hemodynamically significant stenosis or occlusion. There is no dissection or aneurysm. Left carotid system: The left common carotid artery is patent. There is mild soft and calcified atherosclerotic plaque in the proximal left internal carotid artery without hemodynamically significant stenosis or occlusion. There is no dissection or aneurysm. There is mild stenosis at the origin of the right external carotid artery. Vertebral arteries: The vertebral arteries are patent, without hemodynamically significant stenosis, occlusion, dissection, or aneurysm.  Skeleton: There is no significant degenerative change in the cervical spine. There is no visible canal hematoma. Other neck: The soft tissues are unremarkable. There is extensive dental disease. Upper chest: The imaged lung apices are clear. Review of the MIP images confirms the above findings CTA HEAD FINDINGS Anterior circulation: There is mild calcified atherosclerotic plaque in the bilateral intracranial ICAs without hemodynamically significant stenosis or occlusion. The bilateral MCAs are patent. The bilateral ACAs are patent. There is no aneurysm. Posterior circulation: The bilateral V4 segments are patent. The basilar artery is patent. Bilateral posterior communicating arteries are identified. The bilateral PCAs are patent. There is no aneurysm. Venous sinuses: Patent. Anatomic variants: None. Review of the MIP images confirms the above findings CT Brain Perfusion Findings: CBF (<30%) Volume: 062mPerfusion (Tmax>6.0s) volume: 108m62mismatch Volume: N/amL Infarction Location:N/a IMPRESSION: 1. No large vessel occlusion. 2. No infarct core or penumbra identified. 3. Soft plaque resulting in approximately 50% stenosis in the mid right common carotid artery. Mild calcified atherosclerotic plaque in the proximal internal carotid arteries bilaterally without hemodynamically significant stenosis. 4. Minimal calcified atherosclerotic plaque in the bilateral cavernous ICAs without hemodynamically significant stenosis. Otherwise, patent vasculature of the head and neck. 5. Extensive dental disease. These results were paged via AMION at the time  of interpretation on 03/27/2021 at 6:22 pm to provider Dr Rory Percy , who verbally acknowledged these results. Electronically Signed   By: Valetta Mole M.D.   On: 03/27/2021 18:35   CT HEAD CODE STROKE WO CONTRAST`  Result Date: 03/27/2021 CLINICAL DATA:  Code stroke.  Aphasia, headache history of stroke EXAM: CT HEAD WITHOUT CONTRAST TECHNIQUE: Contiguous axial images were  obtained from the base of the skull through the vertex without intravenous contrast. COMPARISON:  CT head 04/25/2020 FINDINGS: Brain: There is no evidence of acute intracranial hemorrhage, extra-axial fluid collection, or acute infarct. Hypodensity in the right basal ganglia is unchanged consistent with prior infarct. There is a probable additional remote infarct in the left internal capsule. Parenchymal volume is normal. There is mild chronic white matter microangiopathy. There is no solid mass lesion. There is no midline shift. Vascular: No hyperdense vessel or unexpected calcification. Skull: Normal. Negative for fracture or focal lesion. Sinuses/Orbits: The imaged paranasal sinuses are clear. The globes and orbits are unremarkable. Other: None. ASPECTS [facility] Stroke Program Early CT Score) - Ganglionic level infarction (caudate, lentiform nuclei, internal capsule, insula, M1-M3 cortex): 7 - Supraganglionic infarction (M4-M6 cortex): 3 Total score (0-10 with 10 being normal): 10 IMPRESSION: 1. No acute intracranial hemorrhage or infarct. 2. ASPECTS is 10 3. Remote right striatocapsular infarct. These results were paged via AMION at the time of interpretation on 03/27/2021 at 5:54 pm to provider Dr Rory Percy. Electronically Signed   By: Valetta Mole M.D.   On: 03/27/2021 17:58    EKG: Independently reviewed.  Normal sinus rhythm.  Assessment/Plan Principal Problem:   Acute CVA (cerebrovascular accident) ([place]) Active Problems:   HTN (hypertension)   Type 2 diabetes mellitus with vascular disease ([place])    Acute CVA -appreciate neurology consult and recommendation.  Patient is on aspirin and Plavix patient did pass stroke swallow.  We will keep patient on Lipitor 80 mg check hemoglobin A1c lipid panel 2D echo allow for permissive hypertension.  Patient's CT angiogram of the head and neck did show 50% right common carotid artery stenosis which we will follow-up neurology recommendations.   Neurochecks. Hypertension we will allow for permissive hypertension with as needed IV hydralazine for systolic more than 509 and diastolic more than 326. Diabetes mellitus type 2 uncontrolled with hyperglycemia -per report patient has been noncompliant with his medications.  Given his significant hyperglycemia I have placed patient on Lantus 15 units with sliding scale coverage.  Check hemoglobin A1c. History of depression recent follow-up and chart notes show that patient was depressed but denies any suicidal thoughts at this time.  We will need to review patient's home medications. Substance abuse drug screen is positive for marijuana.  Not sure if patient has been drinking alcohol.  We will keep patient on CIWA keep on thiamine and closely monitor.  Social work consult.  Need to get further history with patient's family member when available.  Home medications needs to be verified.  As per the report and as per the patient he has not been taking his medicines recently.   DVT prophylaxis: Arixtra.  Patient is allergic to pork derived products and discussed with pharmacy and neurologist. Code Status: Full code. Family Communication: Discussed with patient's niece and was unable to give much history.  Will need to get in touch with other family members. Disposition Plan: To be determined. Consults called: Neurology and physical therapy. Admission status: Observation.   Rise Patience MD [facility] Pager [phone].  If 7PM-7AM,  please contact night-coverage [email or web address] Password [email or web address]  03/27/2021, 8:50 PM

## 2021-03-27 NOTE — ED Notes (Signed)
Patient has completed tele neurology. Patient came through triage, no family present. Patient was having confusion. Last well time was yesterday unknown time. Patient presents with left sided facial drop and left arm drift, slurred speech

## 2021-03-27 NOTE — ED Notes (Signed)
[facility] contacted for transport to [place]  °

## 2021-03-27 NOTE — Consult Note (Signed)
[facility] Telemedicine Consult-CODE STROKE   Requesting Provider: Dr Vanita Panda Consult Participants: Dr. Jerelyn Charles, Telespecialist RN Mist[place],   Bedside RN Larene Beach Location of the provider: Home   Location of the patient: [place]  This consult was provided via telemedicine with 2-way video and audio communication. The patient/family was informed that care would be provided in this way and agreed to receive care in this manner.   Chief Complaint: Left-sided weakness, left facial droop, slurred speech  HPI: 55 year old man past medical history of diabetes, hypertension, tobacco abuse, stroke last year with no residual weakness, presenting for evaluation of sudden onset of left-sided weakness, left facial weakness and slurred speech.  He is able to tell me that he was last known well at 1:30 PM yesterday on March 26, 2021.  Initial examination and history by the emergency room providers-the patient was unclear on the last known well hence a code stroke was activated. He reports no sensory issues but reports that he is not able to talk well and has some dysphagia and dysarthria.  He has had difficulty speaking walking and maintaining balance. Denies drug use except smokes weed. Denies taking his medications Denies CP, SOB, palpitation, wheezing, headaches, vision changes, abd pain, n/v, excessive bleeding or h/o bleeding or clotting issues. Not taking any medications.  ROS performed and negative except as in HPI Social history: tobacco+, Marijuana+, denies cocaine.  Past Medical History:  Diagnosis Date   Diabetes mellitus without complication ([place])    Hypertension    Nicotine dependence, cigarettes, uncomplicated 61/44/3154    No current facility-administered medications for this encounter.  Current Outpatient Medications:    atorvastatin (LIPITOR) 40 MG tablet, TAKE 1 TABLET (40 MG TOTAL) BY MOUTH DAILY. (Patient not taking: Reported on 12/31/2020), Disp: 90 tablet, Rfl: 3    blood glucose meter kit and supplies KIT, Dispense based on patient and insurance preference. Use up to four times daily as directed. (FOR ICD-9 250.00, 250.01). (Patient not taking: Reported on 12/31/2020), Disp: 1 each, Rfl: 0   Blood Glucose Monitoring Suppl (TRUE METRIX METER) w/Device KIT, USE UP TO 4 TIMES DAILY AS DIRECTED (Patient not taking: Reported on 12/31/2020), Disp: 1 kit, Rfl: 0   glipiZIDE (GLUCOTROL) 10 MG tablet, TAKE 1 TABLET (10 MG TOTAL) BY MOUTH 2 (TWO) TIMES DAILY BEFORE A MEAL. (Patient not taking: Reported on 12/31/2020), Disp: 180 tablet, Rfl: 1   glucose blood test strip, USE UP TO FOUR TIMES DAILY AS DIRECTED (Patient not taking: Reported on 12/31/2020), Disp: 200 strip, Rfl: 0   Insulin Glargine (BASAGLAR KWIKPEN) 100 UNIT/ML, INJECT 20 UNITS INTO THE SKIN DAILY. (Patient not taking: Reported on 12/31/2020), Disp: 15 mL, Rfl: 11   insulin glargine (LANTUS) 100 UNIT/ML Solostar Pen, Inject 20 Units into the skin daily. (Patient not taking: Reported on 12/31/2020), Disp: 15 mL, Rfl: 11   insulin lispro (HUMALOG) 100 UNIT/ML KwikPen, INJECT 2-15 UNITS INTO THE SKIN 4 (FOUR) TIMES DAILY - BEFORE MEALS AND AT BEDTIME. (Patient not taking: Reported on 12/31/2020), Disp: 15 mL, Rfl: 2   Insulin Pen Needle 31G X 5 MM MISC, USE AS DIRECTED IN THE MORNING, NOON, EVENING AND BEDTIME (Patient not taking: Reported on 12/31/2020), Disp: 100 each, Rfl: 11   lisinopril (ZESTRIL) 20 MG tablet, TAKE 1 TABLET (20 MG TOTAL) BY MOUTH DAILY. (Patient not taking: Reported on 12/31/2020), Disp: 30 tablet, Rfl: 11   metFORMIN (GLUCOPHAGE) 1000 MG tablet, TAKE 1 TABLET (1,000 MG TOTAL) BY MOUTH 2 (TWO) TIMES DAILY WITH  A MEAL. (Patient not taking: Reported on 12/31/2020), Disp: 180 tablet, Rfl: 3   metoCLOPramide (REGLAN) 10 MG tablet, Take 1 tablet (10 mg total) by mouth every 8 (eight) hours as needed for up to 5 days for refractory nausea / vomiting. If not helped with zofran, Disp: 15 tablet, Rfl: 1    metoCLOPramide (REGLAN) 10 MG tablet, Take 1 tablet (10 mg total) by mouth every 8 (eight) hours for 5 days. for refractory nausea/vomiting., Disp: 15 tablet, Rfl: 1   pantoprazole (PROTONIX) 40 MG tablet, TAKE 1 TABLET (40 MG TOTAL) BY MOUTH DAILY. (Patient not taking: Reported on 12/31/2020), Disp: 30 tablet, Rfl: 1   tamsulosin (FLOMAX) 0.4 MG CAPS capsule, TAKE 1 CAPSULE (0.4 MG TOTAL) BY MOUTH DAILY AFTER BREAKFAST. (Patient not taking: Reported on 12/31/2020), Disp: 30 capsule, Rfl: 3   TRUE METRIX BLOOD GLUCOSE TEST test strip, SMARTSIG:Via Meter 1 to 4 Times Daily (Patient not taking: Reported on 12/31/2020), Disp: , Rfl:    TRUEplus Lancets 28G MISC, USE UP TO 4 TIMES DAILY AS DIRECTED (Patient not taking: Reported on 12/31/2020), Disp: 200 each, Rfl: 0    LKW: 3612 tpa given?: No, OSW IR Thrombectomy? No, outside the window, no LVO Modified Rankin Scale: 1-No significant post stroke disability and can perform usual duties with stroke symptoms Time of teleneurologist evaluation: 2449  Exam: Vitals:   03/27/21 1726  BP: (!) 154/88  Pulse: 85  Resp: 19  Temp: 98 F (36.7 C)  SpO2: 97%    General: Awake alert oriented x3 Speech is moderately dysarthric No evidence of aphasia Cranial nerves: Pupils equal round react light, extraocular movements intact, visual fields appear full, left lower facial weakness evident on camera exam, tongue and palate midline. Motor exam with subtle drift in the left upper and lower extremity.  Right upper and lower extremity without vertical drift No sensory loss.  No extinction on double simultaneous stimulation. Mildly ataxic on the left upper extremity-congruent with the weakness  NIHSS 1A: Level of Consciousness - 0 1B: Ask Month and Age - 0 1C: 'Blink Eyes' & 'Squeeze Hands' - 0 2: Test Horizontal Extraocular Movements - 0 3: Test Visual Fields - 0 4: Test Facial Palsy - 2 5A: Test Left Arm Motor Drift - 1 5B: Test Right Arm Motor Drift -  0 6A: Test Left Leg Motor Drift - 1 6B: Test Right Leg Motor Drift - 0 7: Test Limb Ataxia - 0 8: Test Sensation - 0 9: Test Language/Aphasia- 0 10: Test Dysarthria - 2 11: Test Extinction/Inattention - 0 NIHSS score: 6   Imaging Reviewed: CT head: No acute changes. CT angio head and neck fetal type left PCA.  Mild atherosclerosis in the neck and bilateral carotid bifurcation without any stenosis, patent intracranial vasculature-anterior and posterior circulation. CT perfusion study with no perfusion deficits or mismatch   Labs reviewed in epic and pertinent values follow: Pending at this time.  CBG with sugars greater than 400. CBC    Component Value Date/Time   WBC 9.5 08/07/2020 0245   RBC 4.47 08/07/2020 0245   HGB 13.6 08/07/2020 0245   HCT 40.2 08/07/2020 0245   PLT 262 08/07/2020 0245   MCV 89.9 08/07/2020 0245   MCH 30.4 08/07/2020 0245   MCHC 33.8 08/07/2020 0245   RDW 13.1 08/07/2020 0245   LYMPHSABS 2.7 04/24/2020 1700   MONOABS 0.4 04/24/2020 1700   EOSABS 0.1 04/24/2020 1700   BASOSABS 0.0 04/24/2020 1700   CMP  Component Value Date/Time   NA 138 08/07/2020 0245   K 3.4 (L) 08/07/2020 0245   CL 105 08/07/2020 0245   CO2 25 08/07/2020 0245   GLUCOSE 146 (H) 08/07/2020 0245   BUN 13 08/07/2020 0245   CREATININE 0.82 08/07/2020 0245   CALCIUM 8.9 08/07/2020 0245   PROT 7.5 08/05/2020 1326   ALBUMIN 3.8 08/05/2020 1326   AST 11 (L) 08/05/2020 1326   ALT 14 08/05/2020 1326   ALKPHOS 90 08/05/2020 1326   BILITOT 1.0 08/05/2020 1326   GFRNONAA >60 08/07/2020 0245   GFRAA >60 03/17/2019 0839    Assessment: 55 year old man with prior history of lacunar infarction presenting with sudden onset of left face arm and leg weakness along with dysarthria and dysphagia with last known well at 1:30 PM on 03/26/2021. Outside the window for IV thrombolysis. Exam not consistent with LVO but given concern for aphasia on initial ED exam, head and neck vascular  imaging-CT angiography head and neck and CT perfusion study performed without any evidence of LVO on imaging as well. I suspect that he has had a new lacunar infarction and should get further work-up for stroke risk factors. He is noncompliant with medications, probably contributing to his recurrent small vessel stroke    Recommendations:  Admit to [facility] for stroke risk factor w/u Frequent neurochecks MRI brain without contrast 2D echocardiogram A1c Lipid panel Aspirin 325, Plavix 75 for 3 weeks minimum. High intensity statin-atorvastatin 80 now and daily PT OT Speech therapy Permissive hypertension-allow for blood pressure to be as high as up to 341 systolic and treat only on a as needed basis.  Upon discharge, the goal blood pressure should be 140/90 or below. Counseled on smoking and THC cessation  Discussed my plan with Dr Caroleen Hamman provider at [facility].  Stroke team will follow him when he is at [facility].  This patient is receiving care for possible acute neurological changes. There was 40 minutes of care by this provider at the time of service, including time for direct evaluation via telemedicine, review of medical records, imaging studies and discussion of findings with providers, the patient and/or family.  -- Amie Portland, MD [facility] Pager: [phone] If 7pm to 7am, please call on call as listed on AMION.

## 2021-03-27 NOTE — ED Notes (Signed)
Spoke with patient's girlfriend Rosey Bath. Informed her that patient was being transported to [facility] room [phone]

## 2021-03-27 NOTE — ED Provider Notes (Addendum)
Emergency Medicine Provider Triage Evaluation Note  [place] , a 55 y.o. male  was evaluated in triage.  Pt complains of concern for abnormal mental status.  Family provides history.  Says that he has new slurred speech, weakness, and is "not acting right." Family member states that the people who he lives with do not have working cell phones.  When I explained to her that a specific last known well time is important as he may qualify for potential therapies and treatments as he may meet LVO criteria, however she states there is no way to get in contact with them to figure out when the last time he was actually normal was.    Review of Systems  Positive: Speech change, confusion.  Negative: Unable to obtain.   Physical Exam  BP (!) 154/88 (BP Location: Left Arm)   Pulse 85   Temp 98 F (36.7 C) (Oral)   Resp 19   SpO2 97%  Gen:   Awake, there is left-sided facial droop.  Patient is able to answer simple questions.  He intermittently follows commands.  He is able to open and close his eyes, and stick his tongue out however is unable to smile.   Resp:  Normal effort  MSK:   Left-sided pronator drift.  Able to lift bilateral legs.  No right-sided pronator drift. Other:  Patient is awake.   Medical Decision Making  Medically screening exam initiated at 5:36 PM.  Appropriate orders placed.  [facility] [place] was informed that the remainder of the evaluation will be completed by another provider, this initial triage assessment does not replace that evaluation, and the importance of remaining in the ED until their evaluation is complete.  I spoke with Dr. Amada Jupiter of neurology as given unknown last known well unclear if he would qualify for code stroke or therapies. He recommended activating with a CTA head neck perfusion study.  These are ordered.  Code stroke was activated.  I personally took the telemetry neurology machine to the room, spoke with the stroke nurse and with Dr. Jerrell Belfast of  neurology.  Patient care transferred to Dr. Jeraldine Loots.  Note: Portions of this report may have been transcribed using voice recognition software. Every effort was made to ensure accuracy; however, inadvertent computerized transcription errors may be present    [place] 03/27/21 1806   Clinical Course as of 03/27/21 2136  Sat Mar 27, 2021  1742 I spoke with Dr. Amada Jupiter, he recommends calling patient to code stroke. [EH]    Clinical Course User Index [EH] [place] 03/27/21 2136    Ryan Munch, MD 03/28/21 2329

## 2021-03-27 NOTE — ED Notes (Signed)
Code stroked called by Albina Billet.A

## 2021-03-28 ENCOUNTER — Observation Stay (HOSPITAL_BASED_OUTPATIENT_CLINIC_OR_DEPARTMENT_OTHER): Payer: Medicaid Other

## 2021-03-28 ENCOUNTER — Other Ambulatory Visit: Payer: Self-pay

## 2021-03-28 DIAGNOSIS — I10 Essential (primary) hypertension: Secondary | ICD-10-CM

## 2021-03-28 DIAGNOSIS — I639 Cerebral infarction, unspecified: Secondary | ICD-10-CM

## 2021-03-28 DIAGNOSIS — I6389 Other cerebral infarction: Secondary | ICD-10-CM

## 2021-03-28 LAB — ECHOCARDIOGRAM COMPLETE
Area-P 1/2: 1.95 cm2
Height: 67 in
S' Lateral: 2.7 cm
Weight: 3024 oz

## 2021-03-28 LAB — LIPID PANEL
Cholesterol: 192 mg/dL (ref 0–200)
HDL: 38 mg/dL — ABNORMAL LOW (ref >40)
LDL Cholesterol: 126 mg/dL — ABNORMAL HIGH (ref 0–99)
Total CHOL/HDL Ratio: 5.1 RATIO
Triglycerides: 140 mg/dL (ref <150)
VLDL: 28 mg/dL (ref 0–40)

## 2021-03-28 LAB — GLUCOSE, CAPILLARY
Glucose-Capillary: 268 mg/dL — ABNORMAL HIGH (ref 70–99)
Glucose-Capillary: 299 mg/dL — ABNORMAL HIGH (ref 70–99)

## 2021-03-28 LAB — HEMOGLOBIN A1C
Hgb A1c MFr Bld: 11.2 % — ABNORMAL HIGH (ref 4.8–5.6)
Mean Plasma Glucose: 274.74 mg/dL

## 2021-03-28 MED ORDER — LISINOPRIL 20 MG PO TABS
20.0000 mg | ORAL_TABLET | Freq: Every day | ORAL | 0 refills | Status: DC
  Filled 2021-03-29: qty 30, 30d supply, fill #0

## 2021-03-28 MED ORDER — METFORMIN HCL 1000 MG PO TABS: 1000.0000 mg | ORAL_TABLET | Freq: Two times a day (BID) | ORAL | 0 refills | Status: DC

## 2021-03-28 MED ORDER — INSULIN GLARGINE 100 UNIT/ML SOLOSTAR PEN: 20.0000 [IU] | PEN_INJECTOR | Freq: Every day | SUBCUTANEOUS | 0 refills | Status: DC

## 2021-03-28 MED ORDER — ENSURE ENLIVE PO LIQD
237.0000 mL | Freq: Two times a day (BID) | ORAL | Status: DC
  Administered 2021-03-28 (×2): 237 mL via ORAL

## 2021-03-28 MED ORDER — THIAMINE HCL 100 MG PO TABS: 100.0000 mg | ORAL_TABLET | Freq: Every day | ORAL | 0 refills | Status: DC

## 2021-03-28 MED ORDER — ATORVASTATIN CALCIUM 80 MG PO TABS
80.0000 mg | ORAL_TABLET | Freq: Every day | ORAL | 0 refills | Status: DC
  Filled 2021-03-29: qty 30, 30d supply, fill #0

## 2021-03-28 MED ORDER — LISINOPRIL 20 MG PO TABS: 20.0000 mg | ORAL_TABLET | Freq: Every day | ORAL | 0 refills | Status: DC

## 2021-03-28 MED ORDER — INSULIN GLARGINE 100 UNIT/ML SOLOSTAR PEN
20.0000 [IU] | PEN_INJECTOR | Freq: Every day | SUBCUTANEOUS | 0 refills | Status: DC
  Filled 2021-03-29: qty 6, 30d supply, fill #0

## 2021-03-28 MED ORDER — ATORVASTATIN CALCIUM 80 MG PO TABS: 80.0000 mg | ORAL_TABLET | Freq: Every day | ORAL | 0 refills | Status: DC

## 2021-03-28 MED ORDER — ADULT MULTIVITAMIN W/MINERALS CH: 1.0000 | ORAL_TABLET | Freq: Every day | ORAL | Status: DC

## 2021-03-28 MED ORDER — ASPIRIN 325 MG PO TABS: 325.0000 mg | ORAL_TABLET | Freq: Every day | ORAL | 0 refills | Status: DC

## 2021-03-28 MED ORDER — FOLIC ACID 1 MG PO TABS
1.0000 mg | ORAL_TABLET | Freq: Every day | ORAL | 0 refills | Status: DC
Start: 2021-03-29 — End: 2021-03-28

## 2021-03-28 MED ORDER — FOLIC ACID 1 MG PO TABS
1.0000 mg | ORAL_TABLET | Freq: Every day | ORAL | 0 refills | Status: DC
  Filled 2021-03-29: qty 30, 30d supply, fill #0

## 2021-03-28 MED ORDER — METFORMIN HCL 1000 MG PO TABS
1000.0000 mg | ORAL_TABLET | Freq: Two times a day (BID) | ORAL | 0 refills | Status: DC
  Filled 2021-03-29: qty 60, 30d supply, fill #0

## 2021-03-28 MED ORDER — CLOPIDOGREL BISULFATE 75 MG PO TABS
75.0000 mg | ORAL_TABLET | Freq: Every day | ORAL | 0 refills | Status: AC
  Filled 2021-03-29: qty 21, 21d supply, fill #0

## 2021-03-28 MED ORDER — ADULT MULTIVITAMIN W/MINERALS CH: 1.0000 | ORAL_TABLET | Freq: Every day | ORAL | 0 refills | Status: DC

## 2021-03-28 MED ORDER — CLOPIDOGREL BISULFATE 75 MG PO TABS: 75.0000 mg | ORAL_TABLET | Freq: Every day | ORAL | 0 refills | Status: DC

## 2021-03-28 MED ORDER — INSULIN PEN NEEDLE 31G X 5 MM MISC
0 refills | Status: DC
  Filled 2021-03-29: qty 100, 30d supply, fill #0

## 2021-03-28 NOTE — Progress Notes (Signed)
Echocardiogram 2D Echocardiogram has been performed.  Warren Lacy Layal Javid RDCS 03/28/2021, 2:12 PM

## 2021-03-28 NOTE — Discharge Summary (Addendum)
Physician Discharge Summary  Argelio Granier GYK:[id number] DOB: [birth year] DOA: 03/27/2021  PCP: [name], NP  Admit date: 03/27/2021 Discharge date: 03/28/2021  Admitted From: Home Disposition: Home  Recommendations for Outpatient Follow-up:  Follow up with PCP in 1 week  Outpatient follow up with Neurology Comply with medications and follow up Follow up in ED if symptoms worsen or new appear   Home Health: No Equipment/Devices: None  Discharge Condition: Stable CODE STATUS: Full Diet recommendation: Heart healthy/Carb modified  Brief/Interim Summary:   55 y.o. male with prior history of stroke, diabetes mellitus, hypertension, depression, substance abuse presented with difficulty speaking, left facial droop and left-sided weakness.  Neurology was consulted. CT head followed by CT angiogram of the head and neck did not show any large vessel obstruction but did show 50% stenosis of the mid right common carotid artery.  MRI of the brain did show acute stroke on the right internal capsule. Neurology recommended Aspirin and plavix for 3 weeks and then aspirin alone along with high dose Lipitor; with outpatient followup with Neurology. PT/OT recommended outpatient PT/OT. He is currently medically stable. Discharge patient home today.     Discharge Diagnoses:   Acute CVA -Presented with left-sided facial droop and left-sided weakness.  Imaging findings as above. -Continue aspirin, Plavix and statin. -2D showed EF of 70-75%. -LDL 126; A1c 11.2. -Neurology recommended Aspirin 325 mg daily and plavix 75 mg daily for 3 weeks and then aspirin alone along with high dose Lipitor; with outpatient followup with Neurology. PT/OT recommended outpatient PT/OT. He is currently medically stable. Discharge patient home today.  -tolerating diet   Hypertension -Allow for permissive hypertension.  Blood pressure currently intermittently elevated. Resume lisinopril at home in the next few  days.  Diabetes mellitus type 2 uncontrolled with hyperglycemia -Patient noncompliant with his medications.  Resume lantus and metformin on discharge. Hold Glipizide. Carb modified diet. F/U with PCP at earliest convenience   Hyperlipidemia -Continue Lipitor 80 mg daily.  History of depression  -Outpatient follow-up   Substance abuse -Drug screen positive for marijuana.  Not sure if patient has been drinking alcohol as well.  Continue thiamine, MVI, Folic acid on Discharge. -Social worker consult   Discharge Instructions  Discharge Instructions     Ambulatory referral to Neurology   Complete by: As directed    An appointment is requested in approximately: 2 weeks   Ambulatory referral to Occupational Therapy   Complete by: As directed    Ambulatory referral to Physical Therapy   Complete by: As directed    Diet - low sodium heart healthy   Complete by: As directed    Diet Carb Modified   Complete by: As directed    Increase activity slowly   Complete by: As directed       Allergies as of 03/28/2021       Reactions   Pork-derived Products Other (See Comments)   Causes gout flares   Shellfish-derived Products Other (See Comments)   Causes gout flares        Medication List     STOP taking these medications    glipiZIDE 10 MG tablet Commonly known as: GLUCOTROL   HumaLOG KwikPen 100 UNIT/ML KwikPen Generic drug: insulin lispro   metoCLOPramide 10 MG tablet Commonly known as: REGLAN   pantoprazole 40 MG tablet Commonly known as: PROTONIX   tamsulosin 0.4 MG Caps capsule Commonly known as: FLOMAX       TAKE these medications    aspirin 325  MG tablet Take 1 tablet (325 mg total) by mouth daily. Start taking on: March 29, 2021   atorvastatin 80 MG tablet Commonly known as: LIPITOR Take 1 tablet (80 mg total) by mouth at bedtime. What changed:  medication strength how much to take when to take this   blood glucose meter kit and supplies  Kit Dispense based on patient and insurance preference. Use up to four times daily as directed. (FOR ICD-9 250.00, 250.01).   clopidogrel 75 MG tablet Commonly known as: PLAVIX Take 1 tablet (75 mg total) by mouth daily for 21 days. Start taking on: March 29, 353   folic acid 1 MG tablet Commonly known as: FOLVITE Take 1 tablet (1 mg total) by mouth daily. Start taking on: March 29, 2021   insulin glargine 100 UNIT/ML Solostar Pen Commonly known as: LANTUS Inject 20 Units into the skin daily. What changed: Another medication with the same name was removed. Continue taking this medication, and follow the directions you see here.   Insulin Pen Needle 31G X 5 MM Misc Lantus 20 units daily What changed: additional instructions   lisinopril 20 MG tablet Commonly known as: ZESTRIL Take 1 tablet (20 mg total) by mouth daily. Start taking on: March 31, 2021 What changed:  how much to take These instructions start on March 31, 2021. If you are unsure what to do until then, ask your doctor or other care provider.   metFORMIN 1000 MG tablet Commonly known as: GLUCOPHAGE Take 1 tablet (1,000 mg total) by mouth 2 (two) times daily with a meal. What changed: how much to take   multivitamin with minerals Tabs tablet Take 1 tablet by mouth daily. Start taking on: March 29, 2021   thiamine 100 MG tablet Take 1 tablet (100 mg total) by mouth daily. Start taking on: March 29, 2021   True Metrix Blood Glucose Test test strip Generic drug: glucose blood   True Metrix Blood Glucose Test test strip Generic drug: glucose blood USE UP TO FOUR TIMES DAILY AS DIRECTED   True Metrix Meter w/Device Kit USE UP TO 4 TIMES DAILY AS DIRECTED   TRUEplus Lancets 28G Misc USE UP TO 4 TIMES DAILY AS DIRECTED          Follow-up Information     [name], NP. Schedule an appointment as soon as possible for a visit in 1 week(s).   Specialty: Internal  Medicine Contact information: [id number] [address] [phone]                Allergies  Allergen Reactions   Pork-Derived Products Other (See Comments)    Causes gout flares   Shellfish-Derived Products Other (See Comments)    Causes gout flares    Consultations: Neurology   Procedures/Studies: CT Angio Head W or Wo Contrast  Result Date: 03/27/2021 CLINICAL DATA:  Aphasia, headache EXAM: CT ANGIOGRAPHY HEAD AND NECK CT PERFUSION BRAIN TECHNIQUE: Multidetector CT imaging of the head and neck was performed using the standard protocol during bolus administration of intravenous contrast. Multiplanar CT image reconstructions and MIPs were obtained to evaluate the vascular anatomy. Carotid stenosis measurements (when applicable) are obtained utilizing NASCET criteria, using the distal internal carotid diameter as the denominator. Multiphase CT imaging of the brain was performed following IV bolus contrast injection. Subsequent parametric perfusion maps were calculated using RAPID software. CONTRAST:  168m OMNIPAQUE IOHEXOL 350 MG/ML SOLN COMPARISON:  Same-day noncontrast CT head, CT/CTA head and neck 04/25/2020 FINDINGS:  CTA NECK FINDINGS Aortic arch: There is mild calcified atherosclerotic plaque in the aortic arch. The left vertebral artery arises directly from the aortic arch, a normal variant. The origins of the major vessels are patent. Right carotid system: There is primarily soft plaque in the right common carotid artery resulting approximately 50% stenosis. The distal right common carotid artery is patent. There is mild soft and calcified atherosclerotic plaque in the proximal right internal carotid artery without hemodynamically significant stenosis or occlusion. There is no dissection or aneurysm. Left carotid system: The left common carotid artery is patent. There is mild soft and calcified atherosclerotic plaque in the proximal left internal carotid artery  without hemodynamically significant stenosis or occlusion. There is no dissection or aneurysm. There is mild stenosis at the origin of the right external carotid artery. Vertebral arteries: The vertebral arteries are patent, without hemodynamically significant stenosis, occlusion, dissection, or aneurysm. Skeleton: There is no significant degenerative change in the cervical spine. There is no visible canal hematoma. Other neck: The soft tissues are unremarkable. There is extensive dental disease. Upper chest: The imaged lung apices are clear. Review of the MIP images confirms the above findings CTA HEAD FINDINGS Anterior circulation: There is mild calcified atherosclerotic plaque in the bilateral intracranial ICAs without hemodynamically significant stenosis or occlusion. The bilateral MCAs are patent. The bilateral ACAs are patent. There is no aneurysm. Posterior circulation: The bilateral V4 segments are patent. The basilar artery is patent. Bilateral posterior communicating arteries are identified. The bilateral PCAs are patent. There is no aneurysm. Venous sinuses: Patent. Anatomic variants: None. Review of the MIP images confirms the above findings CT Brain Perfusion Findings: CBF (<30%) Volume: 30m Perfusion (Tmax>6.0s) volume: 061mMismatch Volume: N/amL Infarction Location:N/a IMPRESSION: 1. No large vessel occlusion. 2. No infarct core or penumbra identified. 3. Soft plaque resulting in approximately 50% stenosis in the mid right common carotid artery. Mild calcified atherosclerotic plaque in the proximal internal carotid arteries bilaterally without hemodynamically significant stenosis. 4. Minimal calcified atherosclerotic plaque in the bilateral cavernous ICAs without hemodynamically significant stenosis. Otherwise, patent vasculature of the head and neck. 5. Extensive dental disease. These results were paged via AMION at the time of interpretation on 03/27/2021 at 6:22 pm to provider Dr ArRory Percy who  verbally acknowledged these results. Electronically Signed   By: PeValetta Mole.D.   On: 03/27/2021 18:35   CT Angio Neck W and/or Wo Contrast  Result Date: 03/27/2021 CLINICAL DATA:  Aphasia, headache EXAM: CT ANGIOGRAPHY HEAD AND NECK CT PERFUSION BRAIN TECHNIQUE: Multidetector CT imaging of the head and neck was performed using the standard protocol during bolus administration of intravenous contrast. Multiplanar CT image reconstructions and MIPs were obtained to evaluate the vascular anatomy. Carotid stenosis measurements (when applicable) are obtained utilizing NASCET criteria, using the distal internal carotid diameter as the denominator. Multiphase CT imaging of the brain was performed following IV bolus contrast injection. Subsequent parametric perfusion maps were calculated using RAPID software. CONTRAST:  10043mMNIPAQUE IOHEXOL 350 MG/ML SOLN COMPARISON:  Same-day noncontrast CT head, CT/CTA head and neck 04/25/2020 FINDINGS: CTA NECK FINDINGS Aortic arch: There is mild calcified atherosclerotic plaque in the aortic arch. The left vertebral artery arises directly from the aortic arch, a normal variant. The origins of the major vessels are patent. Right carotid system: There is primarily soft plaque in the right common carotid artery resulting approximately 50% stenosis. The distal right common carotid artery is patent. There is mild soft and calcified atherosclerotic plaque in  the proximal right internal carotid artery without hemodynamically significant stenosis or occlusion. There is no dissection or aneurysm. Left carotid system: The left common carotid artery is patent. There is mild soft and calcified atherosclerotic plaque in the proximal left internal carotid artery without hemodynamically significant stenosis or occlusion. There is no dissection or aneurysm. There is mild stenosis at the origin of the right external carotid artery. Vertebral arteries: The vertebral arteries are patent,  without hemodynamically significant stenosis, occlusion, dissection, or aneurysm. Skeleton: There is no significant degenerative change in the cervical spine. There is no visible canal hematoma. Other neck: The soft tissues are unremarkable. There is extensive dental disease. Upper chest: The imaged lung apices are clear. Review of the MIP images confirms the above findings CTA HEAD FINDINGS Anterior circulation: There is mild calcified atherosclerotic plaque in the bilateral intracranial ICAs without hemodynamically significant stenosis or occlusion. The bilateral MCAs are patent. The bilateral ACAs are patent. There is no aneurysm. Posterior circulation: The bilateral V4 segments are patent. The basilar artery is patent. Bilateral posterior communicating arteries are identified. The bilateral PCAs are patent. There is no aneurysm. Venous sinuses: Patent. Anatomic variants: None. Review of the MIP images confirms the above findings CT Brain Perfusion Findings: CBF (<30%) Volume: 52m Perfusion (Tmax>6.0s) volume: 034mMismatch Volume: N/amL Infarction Location:N/a IMPRESSION: 1. No large vessel occlusion. 2. No infarct core or penumbra identified. 3. Soft plaque resulting in approximately 50% stenosis in the mid right common carotid artery. Mild calcified atherosclerotic plaque in the proximal internal carotid arteries bilaterally without hemodynamically significant stenosis. 4. Minimal calcified atherosclerotic plaque in the bilateral cavernous ICAs without hemodynamically significant stenosis. Otherwise, patent vasculature of the head and neck. 5. Extensive dental disease. These results were paged via AMION at the time of interpretation on 03/27/2021 at 6:22 pm to provider Dr ArRory Percy who verbally acknowledged these results. Electronically Signed   By: PeValetta Mole.D.   On: 03/27/2021 18:35   MR Brain Wo Contrast (neuro protocol)  Result Date: 03/27/2021 CLINICAL DATA:  The acute neurologic deficit EXAM: MRI  HEAD WITHOUT CONTRAST TECHNIQUE: Multiplanar, multiecho pulse sequences of the brain and surrounding structures were obtained without intravenous contrast. COMPARISON:  04/24/2020 FINDINGS: Brain: Small acute infarct along the posterior limb of the right internal capsule. No acute or chronic hemorrhage. There is multifocal hyperintense T2-weighted signal within the white matter. Generalized volume loss without a clear lobar predilection. Old right basal ganglia infarct with ex vacuo dilatation of the frontal horn of the right lateral ventricle. The midline structures are normal. Vascular: Major flow voids are preserved. Skull and upper cervical spine: Normal calvarium and skull base. Visualized upper cervical spine and soft tissues are normal. Sinuses/Orbits:No paranasal sinus fluid levels or advanced mucosal thickening. No mastoid or middle ear effusion. Normal orbits. IMPRESSION: Small acute infarct along the posterior limb of the right internal capsule. No hemorrhage or mass effect. Electronically Signed   By: KeUlyses Jarred.D.   On: 03/27/2021 20:33   CT CEREBRAL PERFUSION W CONTRAST  Result Date: 03/27/2021 CLINICAL DATA:  Aphasia, headache EXAM: CT ANGIOGRAPHY HEAD AND NECK CT PERFUSION BRAIN TECHNIQUE: Multidetector CT imaging of the head and neck was performed using the standard protocol during bolus administration of intravenous contrast. Multiplanar CT image reconstructions and MIPs were obtained to evaluate the vascular anatomy. Carotid stenosis measurements (when applicable) are obtained utilizing NASCET criteria, using the distal internal carotid diameter as the denominator. Multiphase CT imaging of the brain was performed following  IV bolus contrast injection. Subsequent parametric perfusion maps were calculated using RAPID software. CONTRAST:  163m OMNIPAQUE IOHEXOL 350 MG/ML SOLN COMPARISON:  Same-day noncontrast CT head, CT/CTA head and neck 04/25/2020 FINDINGS: CTA NECK FINDINGS Aortic arch:  There is mild calcified atherosclerotic plaque in the aortic arch. The left vertebral artery arises directly from the aortic arch, a normal variant. The origins of the major vessels are patent. Right carotid system: There is primarily soft plaque in the right common carotid artery resulting approximately 50% stenosis. The distal right common carotid artery is patent. There is mild soft and calcified atherosclerotic plaque in the proximal right internal carotid artery without hemodynamically significant stenosis or occlusion. There is no dissection or aneurysm. Left carotid system: The left common carotid artery is patent. There is mild soft and calcified atherosclerotic plaque in the proximal left internal carotid artery without hemodynamically significant stenosis or occlusion. There is no dissection or aneurysm. There is mild stenosis at the origin of the right external carotid artery. Vertebral arteries: The vertebral arteries are patent, without hemodynamically significant stenosis, occlusion, dissection, or aneurysm. Skeleton: There is no significant degenerative change in the cervical spine. There is no visible canal hematoma. Other neck: The soft tissues are unremarkable. There is extensive dental disease. Upper chest: The imaged lung apices are clear. Review of the MIP images confirms the above findings CTA HEAD FINDINGS Anterior circulation: There is mild calcified atherosclerotic plaque in the bilateral intracranial ICAs without hemodynamically significant stenosis or occlusion. The bilateral MCAs are patent. The bilateral ACAs are patent. There is no aneurysm. Posterior circulation: The bilateral V4 segments are patent. The basilar artery is patent. Bilateral posterior communicating arteries are identified. The bilateral PCAs are patent. There is no aneurysm. Venous sinuses: Patent. Anatomic variants: None. Review of the MIP images confirms the above findings CT Brain Perfusion Findings: CBF (<30%)  Volume: 031mPerfusion (Tmax>6.0s) volume: 65m65mismatch Volume: N/amL Infarction Location:N/a IMPRESSION: 1. No large vessel occlusion. 2. No infarct core or penumbra identified. 3. Soft plaque resulting in approximately 50% stenosis in the mid right common carotid artery. Mild calcified atherosclerotic plaque in the proximal internal carotid arteries bilaterally without hemodynamically significant stenosis. 4. Minimal calcified atherosclerotic plaque in the bilateral cavernous ICAs without hemodynamically significant stenosis. Otherwise, patent vasculature of the head and neck. 5. Extensive dental disease. These results were paged via AMION at the time of interpretation on 03/27/2021 at 6:22 pm to provider Dr AroRory Percywho verbally acknowledged these results. Electronically Signed   By: PetValetta MoleD.   On: 03/27/2021 18:35   ECHOCARDIOGRAM COMPLETE  Result Date: 03/28/2021    ECHOCARDIOGRAM REPORT   Patient Name:   STEARKELNEIL Date of Exam: 03/28/2021 Medical Rec #:  [id number]  Height:       67.0 in Accession #:    [id number] Weight:       189.0 lb Date of Birth:  [birth year]  BSA:          1.974 m Patient Age:    [age]ars      BP:           172/99 mmHg Patient Gender: M             HR:           74 bpm. Exam Location:  Inpatient Procedure: 2D Echo, Color Doppler and Cardiac Doppler Indications:    Stroke i63.9  History:  Patient has prior history of Echocardiogram examinations, most                 recent 04/25/2020. Risk Factors:Hypertension, Diabetes and                 Dyslipidemia.  Sonographer:    Raquel Sarna Senior RDCS Referring Phys: [place]  1. Left ventricular ejection fraction, by estimation, is 70 to 75%. The left ventricle has hyperdynamic function. The left ventricle has no regional wall motion abnormalities. There is moderate left ventricular hypertrophy. Left ventricular diastolic parameters are consistent with Grade I diastolic dysfunction (impaired  relaxation).  2. Right ventricular systolic function is normal. The right ventricular size is normal.  3. The mitral valve is normal in structure. No evidence of mitral valve regurgitation. No evidence of mitral stenosis.  4. The aortic valve is tricuspid. Aortic valve regurgitation is not visualized. No aortic stenosis is present.  5. The inferior vena cava is normal in size with greater than 50% respiratory variability, suggesting right atrial pressure of 3 mmHg. FINDINGS  Left Ventricle: Left ventricular ejection fraction, by estimation, is 70 to 75%. The left ventricle has hyperdynamic function. The left ventricle has no regional wall motion abnormalities. The left ventricular internal cavity size was normal in size. There is moderate left ventricular hypertrophy. Left ventricular diastolic parameters are consistent with Grade I diastolic dysfunction (impaired relaxation). Right Ventricle: The right ventricular size is normal. Right ventricular systolic function is normal. Left Atrium: Left atrial size was normal in size. Right Atrium: Right atrial size was normal in size. Pericardium: There is no evidence of pericardial effusion. Mitral Valve: The mitral valve is normal in structure. No evidence of mitral valve regurgitation. No evidence of mitral valve stenosis. Tricuspid Valve: The tricuspid valve is normal in structure. Tricuspid valve regurgitation is trivial. No evidence of tricuspid stenosis. Aortic Valve: The aortic valve is tricuspid. Aortic valve regurgitation is not visualized. No aortic stenosis is present. Pulmonic Valve: The pulmonic valve was normal in structure. Pulmonic valve regurgitation is trivial. No evidence of pulmonic stenosis. Aorta: The aortic root is normal in size and structure. Venous: The inferior vena cava is normal in size with greater than 50% respiratory variability, suggesting right atrial pressure of 3 mmHg. IAS/Shunts: No atrial level shunt detected by color flow Doppler.   LEFT VENTRICLE PLAX 2D LVIDd:         4.80 cm   Diastology LVIDs:         2.70 cm   LV e' medial:    7.72 cm/s LV PW:         1.30 cm   LV E/e' medial:  6.7 LV IVS:        1.10 cm   LV e' lateral:   6.74 cm/s LVOT diam:     2.10 cm   LV E/e' lateral: 7.7 LV SV:         59 LV SV Index:   30 LVOT Area:     3.46 cm  RIGHT VENTRICLE RV S prime:     13.70 cm/s TAPSE (M-mode): 2.6 cm LEFT ATRIUM             Index        RIGHT ATRIUM           Index LA diam:        3.20 cm 1.62 cm/m   RA Area:     13.70 cm LA Vol (A2C):   44.6  ml 22.59 ml/m  RA Volume:   32.00 ml  16.21 ml/m LA Vol (A4C):   27.0 ml 13.68 ml/m LA Biplane Vol: 35.3 ml 17.88 ml/m  AORTIC VALVE LVOT Vmax:   91.00 cm/s LVOT Vmean:  67.000 cm/s LVOT VTI:    0.170 m  AORTA Ao Root diam: 2.90 cm Ao Asc diam:  3.00 cm MITRAL VALVE MV Area (PHT): 1.95 cm    SHUNTS MV Decel Time: 390 msec    Systemic VTI:  0.17 m MV E velocity: 51.90 cm/s  Systemic Diam: 2.10 cm MV A velocity: 69.20 cm/s MV E/A ratio:  0.75 Kirk Ruths MD Electronically signed by Kirk Ruths MD Signature Date/Time: 03/28/2021/2:29:05 PM    Final    CT HEAD CODE STROKE WO CONTRAST`  Result Date: 03/27/2021 CLINICAL DATA:  Code stroke.  Aphasia, headache history of stroke EXAM: CT HEAD WITHOUT CONTRAST TECHNIQUE: Contiguous axial images were obtained from the base of the skull through the vertex without intravenous contrast. COMPARISON:  CT head 04/25/2020 FINDINGS: Brain: There is no evidence of acute intracranial hemorrhage, extra-axial fluid collection, or acute infarct. Hypodensity in the right basal ganglia is unchanged consistent with prior infarct. There is a probable additional remote infarct in the left internal capsule. Parenchymal volume is normal. There is mild chronic white matter microangiopathy. There is no solid mass lesion. There is no midline shift. Vascular: No hyperdense vessel or unexpected calcification. Skull: Normal. Negative for fracture or focal lesion.  Sinuses/Orbits: The imaged paranasal sinuses are clear. The globes and orbits are unremarkable. Other: None. ASPECTS [facility] Stroke Program Early CT Score) - Ganglionic level infarction (caudate, lentiform nuclei, internal capsule, insula, M1-M3 cortex): 7 - Supraganglionic infarction (M4-M6 cortex): 3 Total score (0-10 with 10 being normal): 10 IMPRESSION: 1. No acute intracranial hemorrhage or infarct. 2. ASPECTS is 10 3. Remote right striatocapsular infarct. These results were paged via AMION at the time of interpretation on 03/27/2021 at 5:54 pm to provider Dr Rory Percy. Electronically Signed   By: Valetta Mole M.D.   On: 03/27/2021 17:58      Subjective: Patient seen and examined at bedside.  Sleepy, wakes up slightly, does not participate in conversation much.  No overnight fever or vomiting or seizures reported.  Discharge Exam: Vitals:   03/28/21 0848 03/28/21 1250  BP:  (!) 157/81  Pulse:  74  Resp: 20   Temp:  98 F (36.7 C)  SpO2:  99%    General exam: Appears calm and comfortable.  Currently on room air. Respiratory system: Bilateral decreased breath sounds at bases Cardiovascular system: S1 & S2 heard, Rate controlled Gastrointestinal system: Abdomen is nondistended, soft and nontender. Normal bowel sounds heard. Extremities: No cyanosis, clubbing, edema  Central nervous system: Sleepy, wakes up slightly, does not participate in conversation much.  Moving extremities Skin: No rashes, lesions or ulcers Psychiatry: Could not be assessed because of mental status      The results of significant diagnostics from this hospitalization (including imaging, microbiology, ancillary and laboratory) are listed below for reference.     Microbiology: Recent Results (from the past 240 hour(s))  Resp Panel by RT-PCR (Flu A&B, Covid) Nasopharyngeal Swab     Status: None   Collection Time: 03/27/21  6:30 PM   Specimen: Nasopharyngeal Swab; Nasopharyngeal(NP) swabs in vial transport medium   Result Value Ref Range Status   SARS Coronavirus 2 by RT PCR NEGATIVE NEGATIVE Final    Comment: (NOTE) SARS-CoV-2 target nucleic acids are NOT DETECTED.  The SARS-CoV-2 RNA is generally detectable in upper respiratory specimens during the acute phase of infection. The lowest concentration of SARS-CoV-2 viral copies this assay can detect is 138 copies/mL. A negative result does not preclude SARS-Cov-2 infection and should not be used as the sole basis for treatment or other patient management decisions. A negative result may occur with  improper specimen collection/handling, submission of specimen other than nasopharyngeal swab, presence of viral mutation(s) within the areas targeted by this assay, and inadequate number of viral copies(<138 copies/mL). A negative result must be combined with clinical observations, patient history, and epidemiological information. The expected result is Negative.  Fact Sheet for Patients:  EntrepreneurPulse.com.au  Fact Sheet for Healthcare Providers:  IncredibleEmployment.be  This test is no t yet approved or cleared by the [place] FDA and  has been authorized for detection and/or diagnosis of SARS-CoV-2 by FDA under an Emergency Use Authorization (EUA). This EUA will remain  in effect (meaning this test can be used) for the duration of the COVID-19 declaration under Section 564(b)(1) of the Act, 21 U.S.C.section 360bbb-3(b)(1), unless the authorization is terminated  or revoked sooner.       Influenza A by PCR NEGATIVE NEGATIVE Final   Influenza B by PCR NEGATIVE NEGATIVE Final    Comment: (NOTE) The Xpert Xpress SARS-CoV-2/FLU/RSV plus assay is intended as an aid in the diagnosis of influenza from Nasopharyngeal swab specimens and should not be used as a sole basis for treatment. Nasal washings and aspirates are unacceptable for Xpert Xpress SARS-CoV-2/FLU/RSV testing.  Fact Sheet for  Patients: EntrepreneurPulse.com.au  Fact Sheet for Healthcare Providers: IncredibleEmployment.be  This test is not yet approved or cleared by the [place] FDA and has been authorized for detection and/or diagnosis of SARS-CoV-2 by FDA under an Emergency Use Authorization (EUA). This EUA will remain in effect (meaning this test can be used) for the duration of the COVID-19 declaration under Section 564(b)(1) of the Act, 21 U.S.C. section 360bbb-3(b)(1), unless the authorization is terminated or revoked.  Performed at [facility], [place] [address], [place], [place] [address]      Labs: BNP (last 3 results) No results for input(s): BNP in the last 8760 hours. Basic Metabolic Panel: Recent Labs  Lab 03/27/21 1800 03/27/21 1840  NA 133* 137  K 5.2* 4.0  CL 101 99  CO2 25  --   GLUCOSE 431* 399*  BUN 14 14  CREATININE 0.90 0.80  CALCIUM 9.4  --    Liver Function Tests: Recent Labs  Lab 03/27/21 1800  AST 24  ALT 12  ALKPHOS 87  BILITOT 1.4*  PROT 7.6  ALBUMIN 4.2   No results for input(s): LIPASE, AMYLASE in the last 168 hours. No results for input(s): AMMONIA in the last 168 hours. CBC: Recent Labs  Lab 03/27/21 1800 03/27/21 1840  WBC 7.7  --   NEUTROABS 3.7  --   HGB 15.3 14.6  HCT 44.4 43.0  MCV 87.4  --   PLT 314  --    Cardiac Enzymes: No results for input(s): CKTOTAL, CKMB, CKMBINDEX, TROPONINI in the last 168 hours. BNP: Invalid input(s): POCBNP CBG: Recent Labs  Lab 03/27/21 1736 03/27/21 2244 03/28/21 0610 03/28/21 1201  GLUCAP 423* 277* 268* 299*   D-Dimer No results for input(s): DDIMER in the last 72 hours. Hgb A1c Recent Labs    03/28/21 0011  HGBA1C 11.2*   Lipid Profile Recent Labs    03/28/21 0011  CHOL 192  HDL 38*  LDLCALC 126*  TRIG 140  CHOLHDL 5.1   Thyroid function studies No results for input(s): TSH, T4TOTAL, T3FREE, THYROIDAB in the last 72  hours.  Invalid input(s): FREET3 Anemia work up No results for input(s): VITAMINB12, FOLATE, FERRITIN, TIBC, IRON, RETICCTPCT in the last 72 hours. Urinalysis    Component Value Date/Time   COLORURINE YELLOW 03/27/2021 2005   APPEARANCEUR CLEAR 03/27/2021 2005   LABSPEC >1.046 (H) 03/27/2021 2005   PHURINE 5.0 03/27/2021 2005   GLUCOSEU >=500 (A) 03/27/2021 2005   HGBUR NEGATIVE 03/27/2021 2005   BILIRUBINUR NEGATIVE 03/27/2021 2005   BILIRUBINUR negative 12/31/2020 [place] 03/27/2021 2005   PROTEINUR NEGATIVE 03/27/2021 2005   UROBILINOGEN 0.2 12/31/2020 1548   UROBILINOGEN 0.2 07/04/2010 0959   NITRITE NEGATIVE 03/27/2021 2005   LEUKOCYTESUR NEGATIVE 03/27/2021 2005   Sepsis Labs Invalid input(s): PROCALCITONIN,  WBC,  LACTICIDVEN Microbiology Recent Results (from the past 240 hour(s))  Resp Panel by RT-PCR (Flu A&B, Covid) Nasopharyngeal Swab     Status: None   Collection Time: 03/27/21  6:30 PM   Specimen: Nasopharyngeal Swab; Nasopharyngeal(NP) swabs in vial transport medium  Result Value Ref Range Status   SARS Coronavirus 2 by RT PCR NEGATIVE NEGATIVE Final    Comment: (NOTE) SARS-CoV-2 target nucleic acids are NOT DETECTED.  The SARS-CoV-2 RNA is generally detectable in upper respiratory specimens during the acute phase of infection. The lowest concentration of SARS-CoV-2 viral copies this assay can detect is 138 copies/mL. A negative result does not preclude SARS-Cov-2 infection and should not be used as the sole basis for treatment or other patient management decisions. A negative result may occur with  improper specimen collection/handling, submission of specimen other than nasopharyngeal swab, presence of viral mutation(s) within the areas targeted by this assay, and inadequate number of viral copies(<138 copies/mL). A negative result must be combined with clinical observations, patient history, and epidemiological information. The expected  result is Negative.  Fact Sheet for Patients:  EntrepreneurPulse.com.au  Fact Sheet for Healthcare Providers:  IncredibleEmployment.be  This test is no t yet approved or cleared by the [place] FDA and  has been authorized for detection and/or diagnosis of SARS-CoV-2 by FDA under an Emergency Use Authorization (EUA). This EUA will remain  in effect (meaning this test can be used) for the duration of the COVID-19 declaration under Section 564(b)(1) of the Act, 21 U.S.C.section 360bbb-3(b)(1), unless the authorization is terminated  or revoked sooner.       Influenza A by PCR NEGATIVE NEGATIVE Final   Influenza B by PCR NEGATIVE NEGATIVE Final    Comment: (NOTE) The Xpert Xpress SARS-CoV-2/FLU/RSV plus assay is intended as an aid in the diagnosis of influenza from Nasopharyngeal swab specimens and should not be used as a sole basis for treatment. Nasal washings and aspirates are unacceptable for Xpert Xpress SARS-CoV-2/FLU/RSV testing.  Fact Sheet for Patients: EntrepreneurPulse.com.au  Fact Sheet for Healthcare Providers: IncredibleEmployment.be  This test is not yet approved or cleared by the [place] FDA and has been authorized for detection and/or diagnosis of SARS-CoV-2 by FDA under an Emergency Use Authorization (EUA). This EUA will remain in effect (meaning this test can be used) for the duration of the COVID-19 declaration under Section 564(b)(1) of the Act, 21 U.S.C. section 360bbb-3(b)(1), unless the authorization is terminated or revoked.  Performed at [facility], [place] [address], [place], [place] [address]      Time coordinating discharge: 35 minutes  SIGNED:   Aline August, MD  [facility] 03/28/2021, 3:54 PM

## 2021-03-28 NOTE — Evaluation (Addendum)
Physical Therapy Evaluation Patient Details Name: Ryan Hughes MRN: [id number] DOB: August 11, 1965 Today's Date: 03/28/2021  History of Present Illness  Ryan Hughes is a 55 y.o. male who presented wtih difficulty speaking, L facial droop and L weakness. MRI (+) for acute stroke on the  posterior limb of the right internal capsule. Pt with prior history of stroke, diabetes mellitus, hypertension, depression, substance abuse  Clinical Impression  Prior to admission, pt lives in a level entry apartment with his girlfriend and is independent. Pt displays flat affect and requires increased time to respond and for processing. Ambulating 150 ft x 2 with and without a walker at a supervision level. Demonstrates LUE motor sensory deficits and decreased endurance. He describes a fairly sedentary lifestyle prior to admission. Would benefit from OPPT follow up at discharge. Provided with "[employer]," brochure for transportation services.      Recommendations for follow up therapy are one component of a multi-disciplinary discharge planning process, led by the attending physician.  Recommendations may be updated based on patient status, additional functional criteria and insurance authorization.  Follow Up Recommendations Outpatient PT    Assistance Recommended at Discharge PRN  Functional Status Assessment Patient has had a recent decline in their functional status and demonstrates the ability to make significant improvements in function in a reasonable and predictable amount of time.  Equipment Recommendations  None recommended by PT    Recommendations for Other Services       Precautions / Restrictions Precautions Precautions: Fall Restrictions Weight Bearing Restrictions: No      Mobility  Bed Mobility Overal bed mobility: Needs Assistance Bed Mobility: Supine to Sit     Supine to sit: Min assist     General bed mobility comments: OOB in chair    Transfers Overall transfer level: Needs  assistance Equipment used: Rolling walker (2 wheels) Transfers: Sit to/from Stand Sit to Stand: Supervision           General transfer comment: min A to boost into standing, verbal cues for safety and sequencing    Ambulation/Gait Ambulation/Gait assistance: Supervision Gait Distance (Feet): 300 Feet (150 ft x 2) Assistive device: Rolling walker (2 wheels);None Gait Pattern/deviations: Step-through pattern;Decreased stride length;Narrow base of support Gait velocity: decreased     General Gait Details: cues for walker use, wider BOS, supervision for safety. with no AD, pt reaching intermittently for single UE support  Stairs            Wheelchair Mobility    Modified Rankin (Stroke Patients Only) Modified Rankin (Stroke Patients Only) Pre-Morbid Rankin Score: No significant disability Modified Rankin: Moderately severe disability     Balance Overall balance assessment: Needs assistance Sitting-balance support: Feet supported Sitting balance-Leahy Scale: Good     Standing balance support: Bilateral upper extremity supported Standing balance-Leahy Scale: Poor                               Pertinent Vitals/Pain Pain Assessment: No/denies pain    Home Living Family/patient expects to be discharged to:: Private residence Living Arrangements: Spouse/significant other Available Help at Discharge: Family;Available 24 hours/day (girlfriend) Type of Home: Apartment Home Access: Level entry       Home Layout: One level Home Equipment: Cane - single point      Prior Function Prior Level of Function : Independent/Modified Independent;Driving             Mobility Comments: indep, no AD ADLs Comments:  indep with all ADLs, girlfriend assists with IADLs     Hand Dominance   Dominant Hand: Right    Extremity/Trunk Assessment   Upper Extremity Assessment Upper Extremity Assessment: Defer to OT evaluation RUE Deficits / Details: ROM is WFL,  globally 4/5 RUE Sensation: WNL RUE Coordination: WNL LUE Deficits / Details: ROM is WFL, globally 3+/5 MMT. slow and deliberate thumb to finger coordination task, poor dysdiadochokinesia LUE Sensation: decreased light touch;decreased proprioception LUE Coordination: decreased fine motor;decreased gross motor    Lower Extremity Assessment Lower Extremity Assessment: RLE deficits/detail;LLE deficits/detail RLE Deficits / Details: Strength 5/5 LLE Deficits / Details: Strength 5/5    Cervical / Trunk Assessment Cervical / Trunk Assessment: Normal  Communication   Communication: No difficulties  Cognition Arousal/Alertness: Awake/alert Behavior During Therapy: Flat affect Overall Cognitive Status: Impaired/Different from baseline Area of Impairment: Problem solving                             Problem Solving: Slow processing General Comments: Pt following all commands, occasionally had to repeat questions, increased time to respond        General Comments General comments (skin integrity, edema, etc.): VSS on RA    Exercises     Assessment/Plan    PT Assessment Patient needs continued PT services  PT Problem List Decreased strength;Decreased activity tolerance;Decreased balance;Decreased mobility;Decreased cognition       PT Treatment Interventions DME instruction;Gait training;Functional mobility training;Therapeutic activities;Therapeutic exercise;Balance training;Patient/family education    PT Goals (Current goals can be found in the Care Plan section)  Acute Rehab PT Goals Patient Stated Goal: go home PT Goal Formulation: With patient Time For Goal Achievement: 04/11/21 Potential to Achieve Goals: Good    Frequency Min 4X/week   Barriers to discharge        Co-evaluation               AM-PAC PT "6 Clicks" Mobility  Outcome Measure Help needed turning from your back to your side while in a flat bed without using bedrails?: None Help  needed moving from lying on your back to sitting on the side of a flat bed without using bedrails?: A Little Help needed moving to and from a bed to a chair (including a wheelchair)?: A Little Help needed standing up from a chair using your arms (e.g., wheelchair or bedside chair)?: A Little Help needed to walk in hospital room?: A Little Help needed climbing 3-5 steps with a railing? : A Little 6 Click Score: 19    End of Session   Activity Tolerance: Patient tolerated treatment well Patient left: with call bell/phone within reach;in chair;with chair alarm set Nurse Communication: Mobility status PT Visit Diagnosis: Unsteadiness on feet (R26.81)    Time: 4098-1191 PT Time Calculation (min) (ACUTE ONLY): 32 min   Charges:   PT Evaluation $PT Eval Moderate Complexity: 1 Mod PT Treatments $Gait Training: 8-22 mins        Lillia Pauls, PT, DPT Acute Rehabilitation Services Pager [phone] Office [phone]   Norval Morton 03/28/2021, 11:58 AM

## 2021-03-28 NOTE — Care Management (Signed)
MATCH letter provided to patent and explained at bedside. Prescriptions printed. Nurse instructed to send patient home with printed out Rxs and MATCH. Patient declines OP therapies. Family will provide transportation home

## 2021-03-28 NOTE — Progress Notes (Addendum)
STROKE TEAM PROGRESS NOTE   INTERVAL HISTORY He is sitting in the chair in NAD. No family at the bedside. He states no new neurological deficits overnight. He states he is unemployed and can not afford his medications. He endorses he is a current smoker and he has been educated on smoking cessation.   Vitals:   03/28/21 0846 03/28/21 0847 03/28/21 0848 03/28/21 1250  BP:  (!) 168/78  (!) 157/81  Pulse: 71 65  74  Resp: 19  20   Temp:  97.6 F (36.4 C)  98 F (36.7 C)  TempSrc:  Oral  Oral  SpO2: 98% 98%  99%  Weight:      Height:       CBC:  Recent Labs  Lab 03/27/21 1800 03/27/21 1840  WBC 7.7  --   NEUTROABS 3.7  --   HGB 15.3 14.6  HCT 44.4 43.0  MCV 87.4  --   PLT 314  --    Basic Metabolic Panel:  Recent Labs  Lab 03/27/21 1800 03/27/21 1840  NA 133* 137  K 5.2* 4.0  CL 101 99  CO2 25  --   GLUCOSE 431* 399*  BUN 14 14  CREATININE 0.90 0.80  CALCIUM 9.4  --    Lipid Panel:  Recent Labs  Lab 03/28/21 0011  CHOL 192  TRIG 140  HDL 38*  CHOLHDL 5.1  VLDL 28  LDLCALC 580*   HgbA1c:  Recent Labs  Lab 03/28/21 0011  HGBA1C 11.2*   Urine Drug Screen:  Recent Labs  Lab 03/27/21 2005  LABOPIA NONE DETECTED  COCAINSCRNUR NONE DETECTED  LABBENZ NONE DETECTED  AMPHETMU NONE DETECTED  THCU POSITIVE*  LABBARB NONE DETECTED    Alcohol Level  Recent Labs  Lab 03/27/21 1800  ETH <10    IMAGING past 24 hours CT Angio Head W or Wo Contrast  Result Date: 03/27/2021 CLINICAL DATA:  Aphasia, headache EXAM: CT ANGIOGRAPHY HEAD AND NECK CT PERFUSION BRAIN TECHNIQUE: Multidetector CT imaging of the head and neck was performed using the standard protocol during bolus administration of intravenous contrast. Multiplanar CT image reconstructions and MIPs were obtained to evaluate the vascular anatomy. Carotid stenosis measurements (when applicable) are obtained utilizing NASCET criteria, using the distal internal carotid diameter as the denominator.  Multiphase CT imaging of the brain was performed following IV bolus contrast injection. Subsequent parametric perfusion maps were calculated using RAPID software. CONTRAST:  OMNIPAQUE IOHEXOL 350 MG/ML SOLN COMPARISON:  Same-day noncontrast CT head, CT/CTA head and neck 04/25/2020 FINDINGS: CTA NECK FINDINGS Aortic arch: There is mild calcified atherosclerotic plaque in the aortic arch. The left vertebral artery arises directly from the aortic arch, a normal variant. The origins of the major vessels are patent. Right carotid system: There is primarily soft plaque in the right common carotid artery resulting approximately 50% stenosis. The distal right common carotid artery is patent. There is mild soft and calcified atherosclerotic plaque in the proximal right internal carotid artery without hemodynamically significant stenosis or occlusion. There is no dissection or aneurysm. Left carotid system: The left common carotid artery is patent. There is mild soft and calcified atherosclerotic plaque in the proximal left internal carotid artery without hemodynamically significant stenosis or occlusion. There is no dissection or aneurysm. There is mild stenosis at the origin of the right external carotid artery. Vertebral arteries: The vertebral arteries are patent, without hemodynamically significant stenosis, occlusion, dissection, or aneurysm. Skeleton: There is no significant degenerative change in the  cervical spine. There is no visible canal hematoma. Other neck: The soft tissues are unremarkable. There is extensive dental disease. Upper chest: The imaged lung apices are clear. Review of the MIP images confirms the above findings CTA HEAD FINDINGS Anterior circulation: There is mild calcified atherosclerotic plaque in the bilateral intracranial ICAs without hemodynamically significant stenosis or occlusion. The bilateral MCAs are patent. The bilateral ACAs are patent. There is no aneurysm. Posterior circulation:  The bilateral V4 segments are patent. The basilar artery is patent. Bilateral posterior communicating arteries are identified. The bilateral PCAs are patent. There is no aneurysm. Venous sinuses: Patent. Anatomic variants: None. Review of the MIP images confirms the above findings CT Brain Perfusion Findings: CBF (<30%) Volume: 63mL Perfusion (Tmax>6.0s) volume: 45mL Mismatch Volume: N/amL Infarction Location:N/a IMPRESSION: 1. No large vessel occlusion. 2. No infarct core or penumbra identified. 3. Soft plaque resulting in approximately 50% stenosis in the mid right common carotid artery. Mild calcified atherosclerotic plaque in the proximal internal carotid arteries bilaterally without hemodynamically significant stenosis. 4. Minimal calcified atherosclerotic plaque in the bilateral cavernous ICAs without hemodynamically significant stenosis. Otherwise, patent vasculature of the head and neck. 5. Extensive dental disease. These results were paged via AMION at the time of interpretation on 03/27/2021 at 6:22 pm to provider Dr Rory Percy , who verbally acknowledged these results. Electronically Signed   By: Valetta Mole M.D.   On: 03/27/2021 18:35   CT Angio Neck W and/or Wo Contrast  Result Date: 03/27/2021 CLINICAL DATA:  Aphasia, headache EXAM: CT ANGIOGRAPHY HEAD AND NECK CT PERFUSION BRAIN TECHNIQUE: Multidetector CT imaging of the head and neck was performed using the standard protocol during bolus administration of intravenous contrast. Multiplanar CT image reconstructions and MIPs were obtained to evaluate the vascular anatomy. Carotid stenosis measurements (when applicable) are obtained utilizing NASCET criteria, using the distal internal carotid diameter as the denominator. Multiphase CT imaging of the brain was performed following IV bolus contrast injection. Subsequent parametric perfusion maps were calculated using RAPID software. CONTRAST:  125mL OMNIPAQUE IOHEXOL 350 MG/ML SOLN COMPARISON:  Same-day  noncontrast CT head, CT/CTA head and neck 04/25/2020 FINDINGS: CTA NECK FINDINGS Aortic arch: There is mild calcified atherosclerotic plaque in the aortic arch. The left vertebral artery arises directly from the aortic arch, a normal variant. The origins of the major vessels are patent. Right carotid system: There is primarily soft plaque in the right common carotid artery resulting approximately 50% stenosis. The distal right common carotid artery is patent. There is mild soft and calcified atherosclerotic plaque in the proximal right internal carotid artery without hemodynamically significant stenosis or occlusion. There is no dissection or aneurysm. Left carotid system: The left common carotid artery is patent. There is mild soft and calcified atherosclerotic plaque in the proximal left internal carotid artery without hemodynamically significant stenosis or occlusion. There is no dissection or aneurysm. There is mild stenosis at the origin of the right external carotid artery. Vertebral arteries: The vertebral arteries are patent, without hemodynamically significant stenosis, occlusion, dissection, or aneurysm. Skeleton: There is no significant degenerative change in the cervical spine. There is no visible canal hematoma. Other neck: The soft tissues are unremarkable. There is extensive dental disease. Upper chest: The imaged lung apices are clear. Review of the MIP images confirms the above findings CTA HEAD FINDINGS Anterior circulation: There is mild calcified atherosclerotic plaque in the bilateral intracranial ICAs without hemodynamically significant stenosis or occlusion. The bilateral MCAs are patent. The bilateral ACAs are patent. There is  no aneurysm. Posterior circulation: The bilateral V4 segments are patent. The basilar artery is patent. Bilateral posterior communicating arteries are identified. The bilateral PCAs are patent. There is no aneurysm. Venous sinuses: Patent. Anatomic variants: None.  Review of the MIP images confirms the above findings CT Brain Perfusion Findings: CBF (<30%) Volume: 45mL Perfusion (Tmax>6.0s) volume: 36mL Mismatch Volume: N/amL Infarction Location:N/a IMPRESSION: 1. No large vessel occlusion. 2. No infarct core or penumbra identified. 3. Soft plaque resulting in approximately 50% stenosis in the mid right common carotid artery. Mild calcified atherosclerotic plaque in the proximal internal carotid arteries bilaterally without hemodynamically significant stenosis. 4. Minimal calcified atherosclerotic plaque in the bilateral cavernous ICAs without hemodynamically significant stenosis. Otherwise, patent vasculature of the head and neck. 5. Extensive dental disease. These results were paged via AMION at the time of interpretation on 03/27/2021 at 6:22 pm to provider Dr Rory Percy , who verbally acknowledged these results. Electronically Signed   By: Valetta Mole M.D.   On: 03/27/2021 18:35   MR Brain Wo Contrast (neuro protocol)  Result Date: 03/27/2021 CLINICAL DATA:  The acute neurologic deficit EXAM: MRI HEAD WITHOUT CONTRAST TECHNIQUE: Multiplanar, multiecho pulse sequences of the brain and surrounding structures were obtained without intravenous contrast. COMPARISON:  04/24/2020 FINDINGS: Brain: Small acute infarct along the posterior limb of the right internal capsule. No acute or chronic hemorrhage. There is multifocal hyperintense T2-weighted signal within the white matter. Generalized volume loss without a clear lobar predilection. Old right basal ganglia infarct with ex vacuo dilatation of the frontal horn of the right lateral ventricle. The midline structures are normal. Vascular: Major flow voids are preserved. Skull and upper cervical spine: Normal calvarium and skull base. Visualized upper cervical spine and soft tissues are normal. Sinuses/Orbits:No paranasal sinus fluid levels or advanced mucosal thickening. No mastoid or middle ear effusion. Normal orbits. IMPRESSION:  Small acute infarct along the posterior limb of the right internal capsule. No hemorrhage or mass effect. Electronically Signed   By: Ulyses Jarred M.D.   On: 03/27/2021 20:33   CT CEREBRAL PERFUSION W CONTRAST  Result Date: 03/27/2021 CLINICAL DATA:  Aphasia, headache EXAM: CT ANGIOGRAPHY HEAD AND NECK CT PERFUSION BRAIN TECHNIQUE: Multidetector CT imaging of the head and neck was performed using the standard protocol during bolus administration of intravenous contrast. Multiplanar CT image reconstructions and MIPs were obtained to evaluate the vascular anatomy. Carotid stenosis measurements (when applicable) are obtained utilizing NASCET criteria, using the distal internal carotid diameter as the denominator. Multiphase CT imaging of the brain was performed following IV bolus contrast injection. Subsequent parametric perfusion maps were calculated using RAPID software. CONTRAST:  138mL OMNIPAQUE IOHEXOL 350 MG/ML SOLN COMPARISON:  Same-day noncontrast CT head, CT/CTA head and neck 04/25/2020 FINDINGS: CTA NECK FINDINGS Aortic arch: There is mild calcified atherosclerotic plaque in the aortic arch. The left vertebral artery arises directly from the aortic arch, a normal variant. The origins of the major vessels are patent. Right carotid system: There is primarily soft plaque in the right common carotid artery resulting approximately 50% stenosis. The distal right common carotid artery is patent. There is mild soft and calcified atherosclerotic plaque in the proximal right internal carotid artery without hemodynamically significant stenosis or occlusion. There is no dissection or aneurysm. Left carotid system: The left common carotid artery is patent. There is mild soft and calcified atherosclerotic plaque in the proximal left internal carotid artery without hemodynamically significant stenosis or occlusion. There is no dissection or aneurysm. There is mild stenosis  at the origin of the right external carotid  artery. Vertebral arteries: The vertebral arteries are patent, without hemodynamically significant stenosis, occlusion, dissection, or aneurysm. Skeleton: There is no significant degenerative change in the cervical spine. There is no visible canal hematoma. Other neck: The soft tissues are unremarkable. There is extensive dental disease. Upper chest: The imaged lung apices are clear. Review of the MIP images confirms the above findings CTA HEAD FINDINGS Anterior circulation: There is mild calcified atherosclerotic plaque in the bilateral intracranial ICAs without hemodynamically significant stenosis or occlusion. The bilateral MCAs are patent. The bilateral ACAs are patent. There is no aneurysm. Posterior circulation: The bilateral V4 segments are patent. The basilar artery is patent. Bilateral posterior communicating arteries are identified. The bilateral PCAs are patent. There is no aneurysm. Venous sinuses: Patent. Anatomic variants: None. Review of the MIP images confirms the above findings CT Brain Perfusion Findings: CBF (<30%) Volume: 49mL Perfusion (Tmax>6.0s) volume: 73mL Mismatch Volume: N/amL Infarction Location:N/a IMPRESSION: 1. No large vessel occlusion. 2. No infarct core or penumbra identified. 3. Soft plaque resulting in approximately 50% stenosis in the mid right common carotid artery. Mild calcified atherosclerotic plaque in the proximal internal carotid arteries bilaterally without hemodynamically significant stenosis. 4. Minimal calcified atherosclerotic plaque in the bilateral cavernous ICAs without hemodynamically significant stenosis. Otherwise, patent vasculature of the head and neck. 5. Extensive dental disease. These results were paged via AMION at the time of interpretation on 03/27/2021 at 6:22 pm to provider Dr Rory Percy , who verbally acknowledged these results. Electronically Signed   By: Valetta Mole M.D.   On: 03/27/2021 18:35   CT HEAD CODE STROKE WO CONTRAST`  Result Date:  03/27/2021 CLINICAL DATA:  Code stroke.  Aphasia, headache history of stroke EXAM: CT HEAD WITHOUT CONTRAST TECHNIQUE: Contiguous axial images were obtained from the base of the skull through the vertex without intravenous contrast. COMPARISON:  CT head 04/25/2020 FINDINGS: Brain: There is no evidence of acute intracranial hemorrhage, extra-axial fluid collection, or acute infarct. Hypodensity in the right basal ganglia is unchanged consistent with prior infarct. There is a probable additional remote infarct in the left internal capsule. Parenchymal volume is normal. There is mild chronic white matter microangiopathy. There is no solid mass lesion. There is no midline shift. Vascular: No hyperdense vessel or unexpected calcification. Skull: Normal. Negative for fracture or focal lesion. Sinuses/Orbits: The imaged paranasal sinuses are clear. The globes and orbits are unremarkable. Other: None. ASPECTS [facility] Stroke Program Early CT Score) - Ganglionic level infarction (caudate, lentiform nuclei, internal capsule, insula, M1-M3 cortex): 7 - Supraganglionic infarction (M4-M6 cortex): 3 Total score (0-10 with 10 being normal): 10 IMPRESSION: 1. No acute intracranial hemorrhage or infarct. 2. ASPECTS is 10 3. Remote right striatocapsular infarct. These results were paged via AMION at the time of interpretation on 03/27/2021 at 5:54 pm to provider Dr Rory Percy. Electronically Signed   By: Valetta Mole M.D.   On: 03/27/2021 17:58    PHYSICAL EXAM  Temp:  [97.4 F (36.3 C)-98.7 F (37.1 C)] 98 F (36.7 C) (11/20 1250) Pulse Rate:  [60-87] 74 (11/20 1250) Resp:  [11-29] 20 (11/20 0848) BP: (145-181)/(70-97) 157/81 (11/20 1250) SpO2:  [97 %-100 %] 99 % (11/20 1250) Weight:  [85.7 kg] 85.7 kg (11/19 1736)  General - Well nourished, well developed, in no apparent distress.  Ophthalmologic - fundi not visualized due to noncooperation.  Cardiovascular - Regular rhythm and rate.  Mental Status -  Level of  arousal and orientation to  time, place, and person were intact. Speech is dysarthric. Language including expression, naming, repetition, comprehension was assessed and found intact. Attention span and concentration were normal. Recent and remote memory were intact. Fund of Knowledge was assessed and was intact.  Cranial Nerves II - XII - II - Visual field intact OU. III, IV, VI - Extraocular movements intact. V - Facial sensation intact bilaterally. VII Left facial droop. VIII - Hearing & vestibular intact bilaterally. X - Palate elevates symmetrically. XI - Chin turning & shoulder shrug intact bilaterally. XII - Tongue protrusion intact.  Motor Strength - LUE 4/5 with weak hand grip and, LLE 5/5, RUE and RLE 5/5.   Bulk was normal and fasciculations were absent.  Diminished fine finger movements on the left.  Orbits right over left upper extremity. Motor Tone - Muscle tone was assessed at the neck and appendages and was normal.  Sensory - Light touch, temperature/pinprick were assessed and were symmetrical.    Coordination - The patient had normal movements in the hands and feet with no ataxia or dysmetria.  Has trouble with fine motor skills on Left hand.Tremor was absent.  Gait and Station - deferred.   ASSESSMENT/PLAN Ryan Hughes is a 55 y.o. male with history of stroke, diabetes mellitus, hypertension, depression, tobacco abuse (noncompliant with meds) who presents with sudden onset of left-sided weakness, left facial weakness and slurred speech.   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 ASPECTS 10. Remote right striatocapsular infarct    CTA head & neck  1. No large vessel occlusion. 2. No infarct core or penumbra identified. 3. Soft plaque resulting in approximately 50% stenosis in the mid right common carotid artery. Mild calcified atherosclerotic plaque in the proximal internal  carotid arteries bilaterally without hemodynamically significant stenosis. 4. Minimal calcified atherosclerotic plaque in the bilateral cavernous ICAs without hemodynamically significant stenosis. Otherwise, patent vasculature of the head and neck. 5. Extensive dental disease.   CT perfusion  No infarct core or penumbra identified  MRI   Small acute infarct along the posterior limb of the right internal capsule. No hemorrhage or mass effect  2D Echo ejection fraction 70 to 75%.  No wall motion abnormalities.  Moderate left ventricular hypertrophy LDL 126 HgbA1c 11.2 VTE prophylaxis - SCD's    Diet   Diet heart healthy/carb modified Room service appropriate? Yes; Fluid consistency: Thin   No antithrombotic prior to admission, now on aspirin 325 mg daily and clopidogrel 75 mg daily. For 3 weeks than ASA alone Therapy recommendations:  Outpatient OT/PT Disposition:  pending  Hypertension Home meds:  lisinopril Stable Permissive hypertension (OK if < 220/120) but gradually normalize in 5-7 days Long-term BP goal normotensive  Hyperlipidemia Home meds:  atorvastatin 40mg , resumed in hospital -dose increased to 80mg  LDL 126, goal < 70 Continue statin at discharge  Diabetes type II UnControlled Home meds:  insulin, glipizide and metformin HgbA1c 11.2, goal < 7.0 CBGs Recent Labs    03/27/21 2244 03/28/21 0610 03/28/21 1201  GLUCAP 277* 268* 299*    SSI  Other Stroke Risk Factors Cigarette smoker advised to stop smoking ETOH use, alcohol level <10, advised to drink no more than 1 drink(s) a day Substance abuse - UDS:  THC POSITIVE, Cocaine NONE DETECTED. Patient advised to stop using due to stroke risk. Hx stroke/TIA   Other Active Problems Substance Abuse Depression  Hospital day # 0  Beulah Gandy, NP  STROKE MD NOTE :  I have personally obtained history,examined this patient, reviewed notes, independently viewed imaging studies, participated in medical decision  making and plan of care.ROS completed by me personally and pertinent positives fully documented  I have made any additions or clarifications directly to the above note. Agree with note above.  He presented with dysarthria and facial droop secondary to right subcortical infarct from small vessel disease.  Recommend aspirin and Plavix for 3 weeks followed by aspirin alone and aggressive risk factor modification.  Patient counseled to quit smoking cigarettes and marijuana.  Aggressive risk factor modification.  Follow-up as an outpatient stroke clinic in 2 months.  Discussed with Dr. Jen Mow.  Greater than 50% time during with 35-minute visit was spent on counseling and coordination of care about his lacunar stroke and discussion about stroke prevention and treatment answering questions.  Stroke team will sign off.  Kindly call for questions.  Antony Contras, MD Medical Director [facility] Pager: [phone] 03/28/2021 2:34 PM   To contact Stroke Continuity provider, please refer to [email or web address] After hours, contact General Neurology

## 2021-03-28 NOTE — Evaluation (Signed)
Occupational Therapy Evaluation Patient Details Name: Ryan Hughes MRN: [id number] DOB: Mar 18, 1966 Today's Date: 03/28/2021   History of Present Illness Ryan Hughes is a 55 y.o. male who presented wtih difficulty speaking, L facial droop and L weakness. MRI (+) for acute stroke on the  posterior limb of the right internal capsule. Pt with prior history of stroke, diabetes mellitus, hypertension, depression, substance abuse   Clinical Impression   [name] states that he was indep PTA, he lives in a 1st floor apartment, level entry 0 steps with his girlfriend who can assist 24/7. Pt is now limited by L sided weakness and sensory motor deficits, impair balance, poor insight and higher level cognitive tasks. Overall pt requires min A for transfers and functional ambulation with RW, and up to mod A for ADLs. He will benefit from OT acutely. Recommend d/c home with OP OT follow up.    Recommendations for follow up therapy are one component of a multi-disciplinary discharge planning process, led by the attending physician.  Recommendations may be updated based on patient status, additional functional criteria and insurance authorization.   Follow Up Recommendations  Outpatient OT    Assistance Recommended at Discharge Frequent or constant Supervision/Assistance  Functional Status Assessment  Patient has had a recent decline in their functional status and demonstrates the ability to make significant improvements in function in a reasonable and predictable amount of time.  Equipment Recommendations  BSC/3in1 (RW)    Recommendations for Other Services       Precautions / Restrictions Precautions Precautions: Fall Restrictions Weight Bearing Restrictions: No      Mobility Bed Mobility Overal bed mobility: Needs Assistance Bed Mobility: Supine to Sit     Supine to sit: Min assist     General bed mobility comments: min A to manage LLE and adjust hips towards EOB     Transfers Overall transfer level: Needs assistance Equipment used: Rolling walker (2 wheels) Transfers: Sit to/from Stand Sit to Stand: Min assist           General transfer comment: min A to boost into standing, verbal cues for safety and sequencing      Balance Overall balance assessment: Needs assistance Sitting-balance support: Feet supported Sitting balance-Leahy Scale: Good     Standing balance support: Bilateral upper extremity supported Standing balance-Leahy Scale: Poor                             ADL either performed or assessed with clinical judgement   ADL Overall ADL's : Needs assistance/impaired Eating/Feeding: Independent;Sitting   Grooming: Set up;Sitting   Upper Body Bathing: Minimal assistance;Sitting   Lower Body Bathing: Moderate assistance;Sit to/from stand   Upper Body Dressing : Min guard;Sitting   Lower Body Dressing: Moderate assistance;Sit to/from stand   Toilet Transfer: Minimal assistance;Ambulation;Rolling walker (2 wheels)   Toileting- Clothing Manipulation and Hygiene: Supervision/safety;Sitting/lateral lean       Functional mobility during ADLs: Minimal assistance;Rolling walker (2 wheels);Cueing for safety General ADL Comments: Assistance for L sided weakness and sensory motor deficits. verbal cues fro problem solving and safety. Min A to stand from lower surfaces     Vision Baseline Vision/History: 0 No visual deficits Ability to See in Adequate Light: 0 Adequate Patient Visual Report: No change from baseline Vision Assessment?: Vision impaired- to be further tested in functional context Additional Comments: seemingly WFL, hoever may need further indepth vision assessmen t  Pertinent Vitals/Pain Pain Assessment: No/denies pain     Hand Dominance Right   Extremity/Trunk Assessment Upper Extremity Assessment Upper Extremity Assessment: RUE deficits/detail;LUE deficits/detail RUE Deficits /  Details: ROM is WFL, globally 4/5 RUE Sensation: WNL RUE Coordination: WNL LUE Deficits / Details: ROM is WFL, globally 3+/5 MMT. slow and deliberate thumb to finger coordination task, poor dysdiadochokinesia LUE Sensation: decreased light touch;decreased proprioception LUE Coordination: decreased fine motor;decreased gross motor   Lower Extremity Assessment Lower Extremity Assessment: Defer to PT evaluation   Cervical / Trunk Assessment Cervical / Trunk Assessment: Normal   Communication Communication Communication: No difficulties   Cognition Arousal/Alertness: Awake/alert Behavior During Therapy: Flat affect Overall Cognitive Status: Within Functional Limits for tasks assessed           General Comments: pt is Ox4, followed all simple commands with incrased time. Slow processing noted. Flat affect. Required incrased cues for more complex tasks i.e. vision & coordination assessment     General Comments  VSS on RA            Home Living Family/patient expects to be discharged to:: Private residence Living Arrangements: Spouse/significant other Available Help at Discharge: Family;Available 24 hours/day (girlfriend) Type of Home: Apartment Home Access: Level entry     Home Layout: One level     Bathroom Shower/Tub: Chief Strategy Officer: Standard     Home Equipment: Cane - single point          Prior Functioning/Environment Prior Level of Function : Independent/Modified Independent;Driving             Mobility Comments: indep, no AD ADLs Comments: indep with all ADLs, girlfriend assists with IADLs        OT Problem List: Decreased strength;Decreased range of motion;Decreased activity tolerance;Impaired balance (sitting and/or standing);Decreased cognition;Decreased safety awareness;Decreased knowledge of use of DME or AE;Decreased knowledge of precautions;Pain;Impaired UE functional use      OT Treatment/Interventions: Self-care/ADL  training;Therapeutic exercise;Patient/family education;Balance training;Therapeutic activities;DME and/or AE instruction    OT Goals(Current goals can be found in the care plan section) Acute Rehab OT Goals Patient Stated Goal: home soon OT Goal Formulation: With patient Time For Goal Achievement: 04/10/21 Potential to Achieve Goals: Fair ADL Goals Pt Will Perform Grooming: with min guard assist;standing Pt Will Perform Upper Body Bathing: with set-up;sitting Pt Will Perform Lower Body Bathing: with min guard assist;sit to/from stand Pt Will Perform Lower Body Dressing: with min guard assist;sit to/from stand Pt Will Transfer to Toilet: with min guard assist;ambulating Pt/caregiver will Perform Home Exercise Program: Increased ROM;Increased strength;Left upper extremity;With written HEP provided  OT Frequency: Min 2X/week    AM-PAC OT "6 Clicks" Daily Activity     Outcome Measure Help from another person eating meals?: None Help from another person taking care of personal grooming?: A Little Help from another person toileting, which includes using toliet, bedpan, or urinal?: A Little Help from another person bathing (including washing, rinsing, drying)?: A Lot Help from another person to put on and taking off regular upper body clothing?: A Little Help from another person to put on and taking off regular lower body clothing?: A Lot 6 Click Score: 17   End of Session Equipment Utilized During Treatment: Gait belt;Rolling walker (2 wheels) Nurse Communication: Mobility status  Activity Tolerance: Patient tolerated treatment well Patient left: in chair;with call bell/phone within reach;with chair alarm set  OT Visit Diagnosis: Unsteadiness on feet (R26.81);Other abnormalities of gait and mobility (R26.89);Muscle weakness (generalized) (M62.81);Pain;Hemiplegia and  hemiparesis Hemiplegia - Right/Left: Left Hemiplegia - dominant/non-dominant: Non-Dominant Hemiplegia - caused by: Cerebral  infarction                Time: 6812-7517 OT Time Calculation (min): 24 min Charges:  OT General Charges $OT Visit: 1 Visit OT Evaluation $OT Eval Moderate Complexity: 1 Mod OT Treatments $Therapeutic Activity: 8-22 mins   Arnie Clingenpeel A Maybel Dambrosio 03/28/2021, 10:16 AM

## 2021-03-28 NOTE — Progress Notes (Addendum)
Patient ID: Ryan Hughes, male   DOB: May 31, 1965, 55 y.o.   MRN: [id number]  PROGRESS NOTE    Leonell Lobdell  QIO:[id number] DOB: 05/01/1966 DOA: 03/27/2021 PCP: Grayce Sessions, NP   Brief Narrative:   55 y.o. male with prior history of stroke, diabetes mellitus, hypertension, depression, substance abuse presented with difficulty speaking, left facial droop and left-sided weakness.  Neurology was consulted. CT head followed by CT angiogram of the head and neck did not show any large vessel obstruction but did show 50% stenosis of the mid right common carotid artery.  MRI of the brain did show acute stroke on the right internal capsule.  Assessment & Plan:   Acute CVA -Presented with left-sided facial droop and left-sided weakness.  Imaging findings as above. -Symptoms not fully recovered yet. -Continue aspirin, Plavix and statin. -PT/OT/SLP evaluation.  Monitor mental status/neurochecks. -2D echo.  Allow for permissive hypertension -LDL 126; A1c 11.2.  Hypertension -Allow for permissive hypertension.  Blood pressure currently intermittently elevated  Diabetes mellitus type 2 uncontrolled with hyperglycemia -Patient noncompliant with his medications.  Continue Lantus along with CBGs with SSI.  Carb modified diet  Hyperlipidemia -Continue Lipitor 80 mg daily.  History of depression  -Outpatient follow-up  Substance abuse -Drug screen positive for marijuana.  Not sure if patient has been drinking alcohol as well.  Continue CIWA protocol along with thiamine.  Social worker consult.   DVT prophylaxis: Arixtra.  Patient apparently allergic to pork products Code Status: Full Family Communication: None at bedside Disposition Plan: Status is: Observation  The patient will require care spanning > 2 midnights and should be moved to inpatient because: PT/SLP evaluation pending.  Consultants: Neurology  Procedures: None  Antimicrobials: None   Subjective: Patient seen and  examined at bedside.  Sleepy, wakes up slightly, does not participate in conversation much.  No overnight fever or vomiting or seizures reported.  Objective: Vitals:   03/28/21 0300 03/28/21 0449 03/28/21 0502 03/28/21 0527  BP: (!) 149/70 (!) 152/76  (!) 172/79  Pulse: 60 64  60  Resp: Temp: 97.6 F (36.4 C)  97.8 F (36.6 C) 97.8 F (36.6 C)  TempSrc: Oral  Oral Oral  SpO2: 99%   99%  Weight:      Height:        Intake/Output Summary (Last 24 hours) at 03/28/2021 0952 Last data filed at 03/28/2021 0036 Gross per 24 hour  Intake 360 ml  Output --  Net 360 ml   Filed Weights   03/27/21 1736  Weight: 85.7 kg    Examination:  General exam: Appears calm and comfortable.  Currently on room air. Respiratory system: Bilateral decreased breath sounds at bases Cardiovascular system: S1 & S2 heard, Rate controlled Gastrointestinal system: Abdomen is nondistended, soft and nontender. Normal bowel sounds heard. Extremities: No cyanosis, clubbing, edema  Central nervous system: Sleepy, wakes up slightly, does not participate in conversation much.  Moving extremities Skin: No rashes, lesions or ulcers Psychiatry: Could not be assessed because of mental status   Data Reviewed: I have personally reviewed following labs and imaging studies  CBC: Recent Labs  Lab 03/27/21 1800 03/27/21 1840  WBC 7.7  --   NEUTROABS 3.7  --   HGB 15.3 14.6  HCT 44.4 43.0  MCV 87.4  --   PLT 314  --    Basic Metabolic Panel: Recent Labs  Lab 03/27/21 1800 03/27/21 1840  NA 133* 137  K 5.2* 4.0  CL 101 99  CO2 25  --   GLUCOSE 431* 399*  BUN 14 14  CREATININE 0.90 0.80  CALCIUM 9.4  --    GFR: Estimated Creatinine Clearance: 107.8 mL/min (by C-G formula based on SCr of 0.8 mg/dL). Liver Function Tests: Recent Labs  Lab 03/27/21 1800  AST 24  ALT 12  ALKPHOS 87  BILITOT 1.4*  PROT 7.6  ALBUMIN 4.2   No results for input(s): LIPASE, AMYLASE in the last 168  hours. No results for input(s): AMMONIA in the last 168 hours. Coagulation Profile: No results for input(s): INR, PROTIME in the last 168 hours. Cardiac Enzymes: No results for input(s): CKTOTAL, CKMB, CKMBINDEX, TROPONINI in the last 168 hours. BNP (last 3 results) No results for input(s): PROBNP in the last 8760 hours. HbA1C: Recent Labs    03/28/21 0011  HGBA1C 11.2*   CBG: Recent Labs  Lab 03/27/21 1736 03/27/21 2244 03/28/21 0610  GLUCAP 423* 277* 268*   Lipid Profile: Recent Labs    03/28/21 0011  CHOL 192  HDL 38*  LDLCALC 126*  TRIG 140  CHOLHDL 5.1   Thyroid Function Tests: No results for input(s): TSH, T4TOTAL, FREET4, T3FREE, THYROIDAB in the last 72 hours. Anemia Panel: No results for input(s): VITAMINB12, FOLATE, FERRITIN, TIBC, IRON, RETICCTPCT in the last 72 hours. Sepsis Labs: No results for input(s): PROCALCITON, LATICACIDVEN in the last 168 hours.  Recent Results (from the past 240 hour(s))  Resp Panel by RT-PCR (Flu A&B, Covid) Nasopharyngeal Swab     Status: None   Collection Time: 03/27/21  6:30 PM   Specimen: Nasopharyngeal Swab; Nasopharyngeal(NP) swabs in vial transport medium  Result Value Ref Range Status   SARS Coronavirus 2 by RT PCR NEGATIVE NEGATIVE Final    Comment: (NOTE) SARS-CoV-2 target nucleic acids are NOT DETECTED.  The SARS-CoV-2 RNA is generally detectable in upper respiratory specimens during the acute phase of infection. The lowest concentration of SARS-CoV-2 viral copies this assay can detect is 138 copies/mL. A negative result does not preclude SARS-Cov-2 infection and should not be used as the sole basis for treatment or other patient management decisions. A negative result may occur with  improper specimen collection/handling, submission of specimen other than nasopharyngeal swab, presence of viral mutation(s) within the areas targeted by this assay, and inadequate number of viral copies(<138 copies/mL). A negative  result must be combined with clinical observations, patient history, and epidemiological information. The expected result is Negative.  Fact Sheet for Patients:  BloggerCourse.com  Fact Sheet for Healthcare Providers:  SeriousBroker.it  This test is no t yet approved or cleared by the [place] FDA and  has been authorized for detection and/or diagnosis of SARS-CoV-2 by FDA under an Emergency Use Authorization (EUA). This EUA will remain  in effect (meaning this test can be used) for the duration of the COVID-19 declaration under Section 564(b)(1) of the Act, 21 U.S.C.section 360bbb-3(b)(1), unless the authorization is terminated  or revoked sooner.       Influenza A by PCR NEGATIVE NEGATIVE Final   Influenza B by PCR NEGATIVE NEGATIVE Final    Comment: (NOTE) The Xpert Xpress SARS-CoV-2/FLU/RSV plus assay is intended as an aid in the diagnosis of influenza from Nasopharyngeal swab specimens and should not be used as a sole basis for treatment. Nasal washings and aspirates are unacceptable for Xpert Xpress SARS-CoV-2/FLU/RSV testing.  Fact Sheet for Patients: BloggerCourse.com  Fact Sheet for Healthcare Providers: SeriousBroker.it  This test is not yet approved or  cleared by the [place] and has been authorized for detection and/or diagnosis of SARS-CoV-2 by FDA under an Emergency Use Authorization (EUA). This EUA will remain in effect (meaning this test can be used) for the duration of the COVID-19 declaration under Section 564(b)(1) of the Act, 21 U.S.C. section 360bbb-3(b)(1), unless the authorization is terminated or revoked.  Performed at [facility], [address], [place], [place] [address]          Radiology Studies: CT Angio Head W or Wo Contrast  Result Date: 03/27/2021 CLINICAL DATA:  Aphasia, headache EXAM: CT  ANGIOGRAPHY HEAD AND NECK CT PERFUSION BRAIN TECHNIQUE: Multidetector CT imaging of the head and neck was performed using the standard protocol during bolus administration of intravenous contrast. Multiplanar CT image reconstructions and MIPs were obtained to evaluate the vascular anatomy. Carotid stenosis measurements (when applicable) are obtained utilizing NASCET criteria, using the distal internal carotid diameter as the denominator. Multiphase CT imaging of the brain was performed following IV bolus contrast injection. Subsequent parametric perfusion maps were calculated using RAPID software. CONTRAST:  OMNIPAQUE IOHEXOL 350 MG/ML SOLN COMPARISON:  Same-day noncontrast CT head, CT/CTA head and neck 04/25/2020 FINDINGS: CTA NECK FINDINGS Aortic arch: There is mild calcified atherosclerotic plaque in the aortic arch. The left vertebral artery arises directly from the aortic arch, a normal variant. The origins of the major vessels are patent. Right carotid system: There is primarily soft plaque in the right common carotid artery resulting approximately 50% stenosis. The distal right common carotid artery is patent. There is mild soft and calcified atherosclerotic plaque in the proximal right internal carotid artery without hemodynamically significant stenosis or occlusion. There is no dissection or aneurysm. Left carotid system: The left common carotid artery is patent. There is mild soft and calcified atherosclerotic plaque in the proximal left internal carotid artery without hemodynamically significant stenosis or occlusion. There is no dissection or aneurysm. There is mild stenosis at the origin of the right external carotid artery. Vertebral arteries: The vertebral arteries are patent, without hemodynamically significant stenosis, occlusion, dissection, or aneurysm. Skeleton: There is no significant degenerative change in the cervical spine. There is no visible canal hematoma. Other neck: The soft  tissues are unremarkable. There is extensive dental disease. Upper chest: The imaged lung apices are clear. Review of the MIP images confirms the above findings CTA HEAD FINDINGS Anterior circulation: There is mild calcified atherosclerotic plaque in the bilateral intracranial ICAs without hemodynamically significant stenosis or occlusion. The bilateral MCAs are patent. The bilateral ACAs are patent. There is no aneurysm. Posterior circulation: The bilateral V4 segments are patent. The basilar artery is patent. Bilateral posterior communicating arteries are identified. The bilateral PCAs are patent. There is no aneurysm. Venous sinuses: Patent. Anatomic variants: None. Review of the MIP images confirms the above findings CT Brain Perfusion Findings: CBF (<30%) Volume: 27mL Perfusion (Tmax>6.0s) volume: 24mL Mismatch Volume: N/amL Infarction Location:N/a IMPRESSION: 1. No large vessel occlusion. 2. No infarct core or penumbra identified. 3. Soft plaque resulting in approximately 50% stenosis in the mid right common carotid artery. Mild calcified atherosclerotic plaque in the proximal internal carotid arteries bilaterally without hemodynamically significant stenosis. 4. Minimal calcified atherosclerotic plaque in the bilateral cavernous ICAs without hemodynamically significant stenosis. Otherwise, patent vasculature of the head and neck. 5. Extensive dental disease. These results were paged via AMION at the time of interpretation on 03/27/2021 at 6:22 pm to provider Dr Wilford Corner , who verbally acknowledged these results. Electronically Signed  By: Lesia Hausen M.D.   On: 03/27/2021 18:35   CT Angio Neck W and/or Wo Contrast  Result Date: 03/27/2021 CLINICAL DATA:  Aphasia, headache EXAM: CT ANGIOGRAPHY HEAD AND NECK CT PERFUSION BRAIN TECHNIQUE: Multidetector CT imaging of the head and neck was performed using the standard protocol during bolus administration of intravenous contrast. Multiplanar CT image  reconstructions and MIPs were obtained to evaluate the vascular anatomy. Carotid stenosis measurements (when applicable) are obtained utilizing NASCET criteria, using the distal internal carotid diameter as the denominator. Multiphase CT imaging of the brain was performed following IV bolus contrast injection. Subsequent parametric perfusion maps were calculated using RAPID software. CONTRAST:  OMNIPAQUE IOHEXOL 350 MG/ML SOLN COMPARISON:  Same-day noncontrast CT head, CT/CTA head and neck 04/25/2020 FINDINGS: CTA NECK FINDINGS Aortic arch: There is mild calcified atherosclerotic plaque in the aortic arch. The left vertebral artery arises directly from the aortic arch, a normal variant. The origins of the major vessels are patent. Right carotid system: There is primarily soft plaque in the right common carotid artery resulting approximately 50% stenosis. The distal right common carotid artery is patent. There is mild soft and calcified atherosclerotic plaque in the proximal right internal carotid artery without hemodynamically significant stenosis or occlusion. There is no dissection or aneurysm. Left carotid system: The left common carotid artery is patent. There is mild soft and calcified atherosclerotic plaque in the proximal left internal carotid artery without hemodynamically significant stenosis or occlusion. There is no dissection or aneurysm. There is mild stenosis at the origin of the right external carotid artery. Vertebral arteries: The vertebral arteries are patent, without hemodynamically significant stenosis, occlusion, dissection, or aneurysm. Skeleton: There is no significant degenerative change in the cervical spine. There is no visible canal hematoma. Other neck: The soft tissues are unremarkable. There is extensive dental disease. Upper chest: The imaged lung apices are clear. Review of the MIP images confirms the above findings CTA HEAD FINDINGS Anterior circulation: There is mild calcified  atherosclerotic plaque in the bilateral intracranial ICAs without hemodynamically significant stenosis or occlusion. The bilateral MCAs are patent. The bilateral ACAs are patent. There is no aneurysm. Posterior circulation: The bilateral V4 segments are patent. The basilar artery is patent. Bilateral posterior communicating arteries are identified. The bilateral PCAs are patent. There is no aneurysm. Venous sinuses: Patent. Anatomic variants: None. Review of the MIP images confirms the above findings CT Brain Perfusion Findings: CBF (<30%) Volume: 63mL Perfusion (Tmax>6.0s) volume: 56mL Mismatch Volume: N/amL Infarction Location:N/a IMPRESSION: 1. No large vessel occlusion. 2. No infarct core or penumbra identified. 3. Soft plaque resulting in approximately 50% stenosis in the mid right common carotid artery. Mild calcified atherosclerotic plaque in the proximal internal carotid arteries bilaterally without hemodynamically significant stenosis. 4. Minimal calcified atherosclerotic plaque in the bilateral cavernous ICAs without hemodynamically significant stenosis. Otherwise, patent vasculature of the head and neck. 5. Extensive dental disease. These results were paged via AMION at the time of interpretation on 03/27/2021 at 6:22 pm to provider Dr Wilford Corner , who verbally acknowledged these results. Electronically Signed   By: Lesia Hausen M.D.   On: 03/27/2021 18:35   MR Brain Wo Contrast (neuro protocol)  Result Date: 03/27/2021 CLINICAL DATA:  The acute neurologic deficit EXAM: MRI HEAD WITHOUT CONTRAST TECHNIQUE: Multiplanar, multiecho pulse sequences of the brain and surrounding structures were obtained without intravenous contrast. COMPARISON:  04/24/2020 FINDINGS: Brain: Small acute infarct along the posterior limb of the right internal capsule. No acute or chronic  hemorrhage. There is multifocal hyperintense T2-weighted signal within the white matter. Generalized volume loss without a clear lobar predilection.  Old right basal ganglia infarct with ex vacuo dilatation of the frontal horn of the right lateral ventricle. The midline structures are normal. Vascular: Major flow voids are preserved. Skull and upper cervical spine: Normal calvarium and skull base. Visualized upper cervical spine and soft tissues are normal. Sinuses/Orbits:No paranasal sinus fluid levels or advanced mucosal thickening. No mastoid or middle ear effusion. Normal orbits. IMPRESSION: Small acute infarct along the posterior limb of the right internal capsule. No hemorrhage or mass effect. Electronically Signed   By: Deatra Robinson M.D.   On: 03/27/2021 20:33   CT CEREBRAL PERFUSION W CONTRAST  Result Date: 03/27/2021 CLINICAL DATA:  Aphasia, headache EXAM: CT ANGIOGRAPHY HEAD AND NECK CT PERFUSION BRAIN TECHNIQUE: Multidetector CT imaging of the head and neck was performed using the standard protocol during bolus administration of intravenous contrast. Multiplanar CT image reconstructions and MIPs were obtained to evaluate the vascular anatomy. Carotid stenosis measurements (when applicable) are obtained utilizing NASCET criteria, using the distal internal carotid diameter as the denominator. Multiphase CT imaging of the brain was performed following IV bolus contrast injection. Subsequent parametric perfusion maps were calculated using RAPID software. CONTRAST:  OMNIPAQUE IOHEXOL 350 MG/ML SOLN COMPARISON:  Same-day noncontrast CT head, CT/CTA head and neck 04/25/2020 FINDINGS: CTA NECK FINDINGS Aortic arch: There is mild calcified atherosclerotic plaque in the aortic arch. The left vertebral artery arises directly from the aortic arch, a normal variant. The origins of the major vessels are patent. Right carotid system: There is primarily soft plaque in the right common carotid artery resulting approximately 50% stenosis. The distal right common carotid artery is patent. There is mild soft and calcified atherosclerotic plaque in the proximal  right internal carotid artery without hemodynamically significant stenosis or occlusion. There is no dissection or aneurysm. Left carotid system: The left common carotid artery is patent. There is mild soft and calcified atherosclerotic plaque in the proximal left internal carotid artery without hemodynamically significant stenosis or occlusion. There is no dissection or aneurysm. There is mild stenosis at the origin of the right external carotid artery. Vertebral arteries: The vertebral arteries are patent, without hemodynamically significant stenosis, occlusion, dissection, or aneurysm. Skeleton: There is no significant degenerative change in the cervical spine. There is no visible canal hematoma. Other neck: The soft tissues are unremarkable. There is extensive dental disease. Upper chest: The imaged lung apices are clear. Review of the MIP images confirms the above findings CTA HEAD FINDINGS Anterior circulation: There is mild calcified atherosclerotic plaque in the bilateral intracranial ICAs without hemodynamically significant stenosis or occlusion. The bilateral MCAs are patent. The bilateral ACAs are patent. There is no aneurysm. Posterior circulation: The bilateral V4 segments are patent. The basilar artery is patent. Bilateral posterior communicating arteries are identified. The bilateral PCAs are patent. There is no aneurysm. Venous sinuses: Patent. Anatomic variants: None. Review of the MIP images confirms the above findings CT Brain Perfusion Findings: CBF (<30%) Volume: 12mL Perfusion (Tmax>6.0s) volume: 60mL Mismatch Volume: N/amL Infarction Location:N/a IMPRESSION: 1. No large vessel occlusion. 2. No infarct core or penumbra identified. 3. Soft plaque resulting in approximately 50% stenosis in the mid right common carotid artery. Mild calcified atherosclerotic plaque in the proximal internal carotid arteries bilaterally without hemodynamically significant stenosis. 4. Minimal calcified atherosclerotic  plaque in the bilateral cavernous ICAs without hemodynamically significant stenosis. Otherwise, patent vasculature of the head and neck.  5. Extensive dental disease. These results were paged via AMION at the time of interpretation on 03/27/2021 at 6:22 pm to provider Dr Wilford Corner , who verbally acknowledged these results. Electronically Signed   By: Lesia Hausen M.D.   On: 03/27/2021 18:35   CT HEAD CODE STROKE WO CONTRAST`  Result Date: 03/27/2021 CLINICAL DATA:  Code stroke.  Aphasia, headache history of stroke EXAM: CT HEAD WITHOUT CONTRAST TECHNIQUE: Contiguous axial images were obtained from the base of the skull through the vertex without intravenous contrast. COMPARISON:  CT head 04/25/2020 FINDINGS: Brain: There is no evidence of acute intracranial hemorrhage, extra-axial fluid collection, or acute infarct. Hypodensity in the right basal ganglia is unchanged consistent with prior infarct. There is a probable additional remote infarct in the left internal capsule. Parenchymal volume is normal. There is mild chronic white matter microangiopathy. There is no solid mass lesion. There is no midline shift. Vascular: No hyperdense vessel or unexpected calcification. Skull: Normal. Negative for fracture or focal lesion. Sinuses/Orbits: The imaged paranasal sinuses are clear. The globes and orbits are unremarkable. Other: None. ASPECTS [facility] Stroke Program Early CT Score) - Ganglionic level infarction (caudate, lentiform nuclei, internal capsule, insula, M1-M3 cortex): 7 - Supraganglionic infarction (M4-M6 cortex): 3 Total score (0-10 with 10 being normal): 10 IMPRESSION: 1. No acute intracranial hemorrhage or infarct. 2. ASPECTS is 10 3. Remote right striatocapsular infarct. These results were paged via AMION at the time of interpretation on 03/27/2021 at 5:54 pm to provider Dr Wilford Corner. Electronically Signed   By: Lesia Hausen M.D.   On: 03/27/2021 17:58        Scheduled Meds:  aspirin  300 mg Rectal Daily    Or   aspirin  325 mg Oral Daily   atorvastatin  80 mg Oral QHS   clopidogrel  75 mg Oral Daily   feeding supplement  237 mL Oral BID BM   folic acid  1 mg Oral Daily   fondaparinux (ARIXTRA) injection  2.5 mg Subcutaneous Q24H   insulin aspart  0-9 Units Subcutaneous TID WC   insulin glargine-yfgn  15 Units Subcutaneous QHS   multivitamin with minerals  1 tablet Oral Daily   thiamine  100 mg Oral Daily   Or   thiamine  100 mg Intravenous Daily   Continuous Infusions:  sodium chloride 75 mL/hr at 03/27/21 2321          Glade Lloyd, MD [facility] 03/28/2021, 9:52 AM

## 2021-03-29 ENCOUNTER — Other Ambulatory Visit: Payer: Self-pay

## 2021-03-29 ENCOUNTER — Telehealth: Payer: Self-pay

## 2021-03-29 NOTE — Telephone Encounter (Signed)
Transition Care Management Unsuccessful Follow-up Telephone Call  Date of discharge and from where:  03/28/2021, [facility]   Attempts:  1st Attempt  Reason for unsuccessful TCM follow-up call:  Unable to reach patient, call placed to # [phone], phone rings then goes to fast busy.   Need to discuss scheduling a follow up appointment with Gwinda Passe, NP at [facility]/

## 2021-03-30 ENCOUNTER — Telehealth: Payer: Self-pay

## 2021-03-30 NOTE — Telephone Encounter (Signed)
Transition Care Management Unsuccessful Follow-up Telephone Call  Date of discharge and from where:  03/28/2021, [facility]   Attempts:  2nd Attempt  Reason for unsuccessful TCM follow-up call:  Unable to reach patient, call placed to # [phone] and the phone rings, then goes to fast busy.   Call placed to # [phone] listed as a number for [facility], significant other. His niece, Olen Pel, answered.She did not have another number to reach patient.   She said that she is not able to take him to his appt tomorrow and wanted to re-schedule. He was not with her and there is no DPR and she is not listed as a contact.I explained to her that I would ask the provider for that appointment to call her back to reschedule.  She said the best number is to reach her is  [phone]. His appointment tomorrow is with Norton Blizzard, LCSW

## 2021-03-31 ENCOUNTER — Telehealth: Payer: Self-pay

## 2021-03-31 ENCOUNTER — Ambulatory Visit: Payer: Self-pay | Admitting: Clinical

## 2021-03-31 NOTE — Telephone Encounter (Signed)
Spoke with pt and pt's significant other. Pt mentioned he missed appt today due tor recent hospitalization. Rescheduled pt appt with me for 04/15/21. Pt's phone doesn't have minutes and pt's significant other's phone is not working. Callback number is [phone], they are using their neighbor's phone.

## 2021-03-31 NOTE — Telephone Encounter (Signed)
Call returned to patient's fiance, Rosey Bath. And documented in another encounter

## 2021-03-31 NOTE — Telephone Encounter (Signed)
Message received from [facility], LCSW  noting patient's fiance, Lucy Antigua, called back # [phone].  This CM returned call to [place]. She explained that the patient was sleeping and " not doing too good." When asked to explain further, she said his speech seems more slurred and he can't move around well. She thinks that he has become worse since returning home from the hospital.  She said he has been taking his medications as ordered.  This CM explained to her he should be evaluated in ED if his symptoms are worse. She said she understood but he will not want to go.  This CM stressed the importance of being evaluated in ED if his symptoms have progressed. He should not wait until his appointment at [facility] to be evaluated by a provider.   appointment scheduled with Ms Randa Evens, NP @ [facility] - 04/19/2021 @ 1430. He has an appointment with Norton Blizzard, LCSW 04/15/2021.  Marcelino Duster - if you have a cancellation on 04/15/2021 , after he sees Asante would you be able to see him then, instead of having him come back on 04/19/2021?

## 2021-03-31 NOTE — Telephone Encounter (Signed)
Transition Care Management Unsuccessful Follow-up Telephone Call  Date of discharge and from where:  03/28/2021, [facility]   Attempts:  3rd Attempt  Reason for unsuccessful TCM follow-up call:  Left voice message with patient's niece, Despina Hick at # [phone]. She said she would have family get the message to him to call this clinic and ask for Asante, LCSW.  He missed his appointment today with Asante.  Letter has also been sent to him requesting he contact [facility] to schedule a hospital follow up appointment as we have not been able to reach him.

## 2021-04-03 ENCOUNTER — Other Ambulatory Visit: Payer: Self-pay

## 2021-04-03 ENCOUNTER — Emergency Department (HOSPITAL_COMMUNITY)
Admission: EM | Admit: 2021-04-03 | Discharge: 2021-04-03 | Disposition: A | Discharge status: Home / Self Care | Payer: Medicaid Other | Attending: Emergency Medicine | Admitting: Emergency Medicine

## 2021-04-03 ENCOUNTER — Emergency Department (HOSPITAL_COMMUNITY): Payer: Medicaid Other

## 2021-04-03 DIAGNOSIS — Z794 Long term (current) use of insulin: Secondary | ICD-10-CM | POA: Insufficient documentation

## 2021-04-03 DIAGNOSIS — Z7984 Long term (current) use of oral hypoglycemic drugs: Secondary | ICD-10-CM | POA: Insufficient documentation

## 2021-04-03 DIAGNOSIS — R110 Nausea: Secondary | ICD-10-CM

## 2021-04-03 DIAGNOSIS — F17210 Nicotine dependence, cigarettes, uncomplicated: Secondary | ICD-10-CM | POA: Diagnosis not present

## 2021-04-03 DIAGNOSIS — E119 Type 2 diabetes mellitus without complications: Secondary | ICD-10-CM | POA: Insufficient documentation

## 2021-04-03 DIAGNOSIS — R112 Nausea with vomiting, unspecified: Secondary | ICD-10-CM | POA: Diagnosis present

## 2021-04-03 DIAGNOSIS — I10 Essential (primary) hypertension: Secondary | ICD-10-CM | POA: Insufficient documentation

## 2021-04-03 LAB — COMPREHENSIVE METABOLIC PANEL
ALT: 22 U/L (ref 0–44)
AST: 22 U/L (ref 15–41)
Albumin: 4.5 g/dL (ref 3.5–5.0)
Alkaline Phosphatase: 83 U/L (ref 38–126)
Anion gap: 16 — ABNORMAL HIGH (ref 5–15)
BUN: 9 mg/dL (ref 6–20)
CO2: 27 mmol/L (ref 22–32)
Calcium: 9.8 mg/dL (ref 8.9–10.3)
Chloride: 99 mmol/L (ref 98–111)
Creatinine, Ser: 0.84 mg/dL (ref 0.61–1.24)
GFR, Estimated: >60 mL/min (ref >60)
Glucose, Bld: 173 mg/dL — ABNORMAL HIGH (ref 70–99)
Potassium: 3.1 mmol/L — ABNORMAL LOW (ref 3.5–5.1)
Sodium: 142 mmol/L (ref 135–145)
Total Bilirubin: 1.1 mg/dL (ref 0.3–1.2)
Total Protein: 7.9 g/dL (ref 6.5–8.1)

## 2021-04-03 LAB — CBC WITH DIFFERENTIAL/PLATELET
Abs Immature Granulocytes: 0.02 10*3/uL (ref 0.00–0.07)
Basophils Absolute: 0 10*3/uL (ref 0.0–0.1)
Basophils Relative: 0 %
Eosinophils Absolute: 0 10*3/uL (ref 0.0–0.5)
Eosinophils Relative: 0 %
HCT: 49.7 % (ref 39.0–52.0)
Hemoglobin: 17 g/dL (ref 13.0–17.0)
Immature Granulocytes: 0 %
Lymphocytes Relative: 15 %
Lymphs Abs: 1.6 10*3/uL (ref 0.7–4.0)
MCH: 29.5 pg (ref 26.0–34.0)
MCHC: 34.2 g/dL (ref 30.0–36.0)
MCV: 86.3 fL (ref 80.0–100.0)
Monocytes Absolute: 0.3 10*3/uL (ref 0.1–1.0)
Monocytes Relative: 3 %
Neutro Abs: 8.9 10*3/uL — ABNORMAL HIGH (ref 1.7–7.7)
Neutrophils Relative %: 82 %
Platelets: 312 10*3/uL (ref 150–400)
RBC: 5.76 MIL/uL (ref 4.22–5.81)
RDW: 13.6 % (ref 11.5–15.5)
WBC: 10.9 10*3/uL — ABNORMAL HIGH (ref 4.0–10.5)
nRBC: 0 % (ref 0.0–0.2)

## 2021-04-03 LAB — LIPASE, BLOOD: Lipase: 20 U/L (ref 11–51)

## 2021-04-03 IMAGING — DX DG CHEST 1V PORT
1 series · 1 of 1 positions shown · non-contrast
Comparison: [DATE]

CLINICAL DATA: Nausea.

EXAM:
PORTABLE CHEST 1 VIEW

[chest ap]
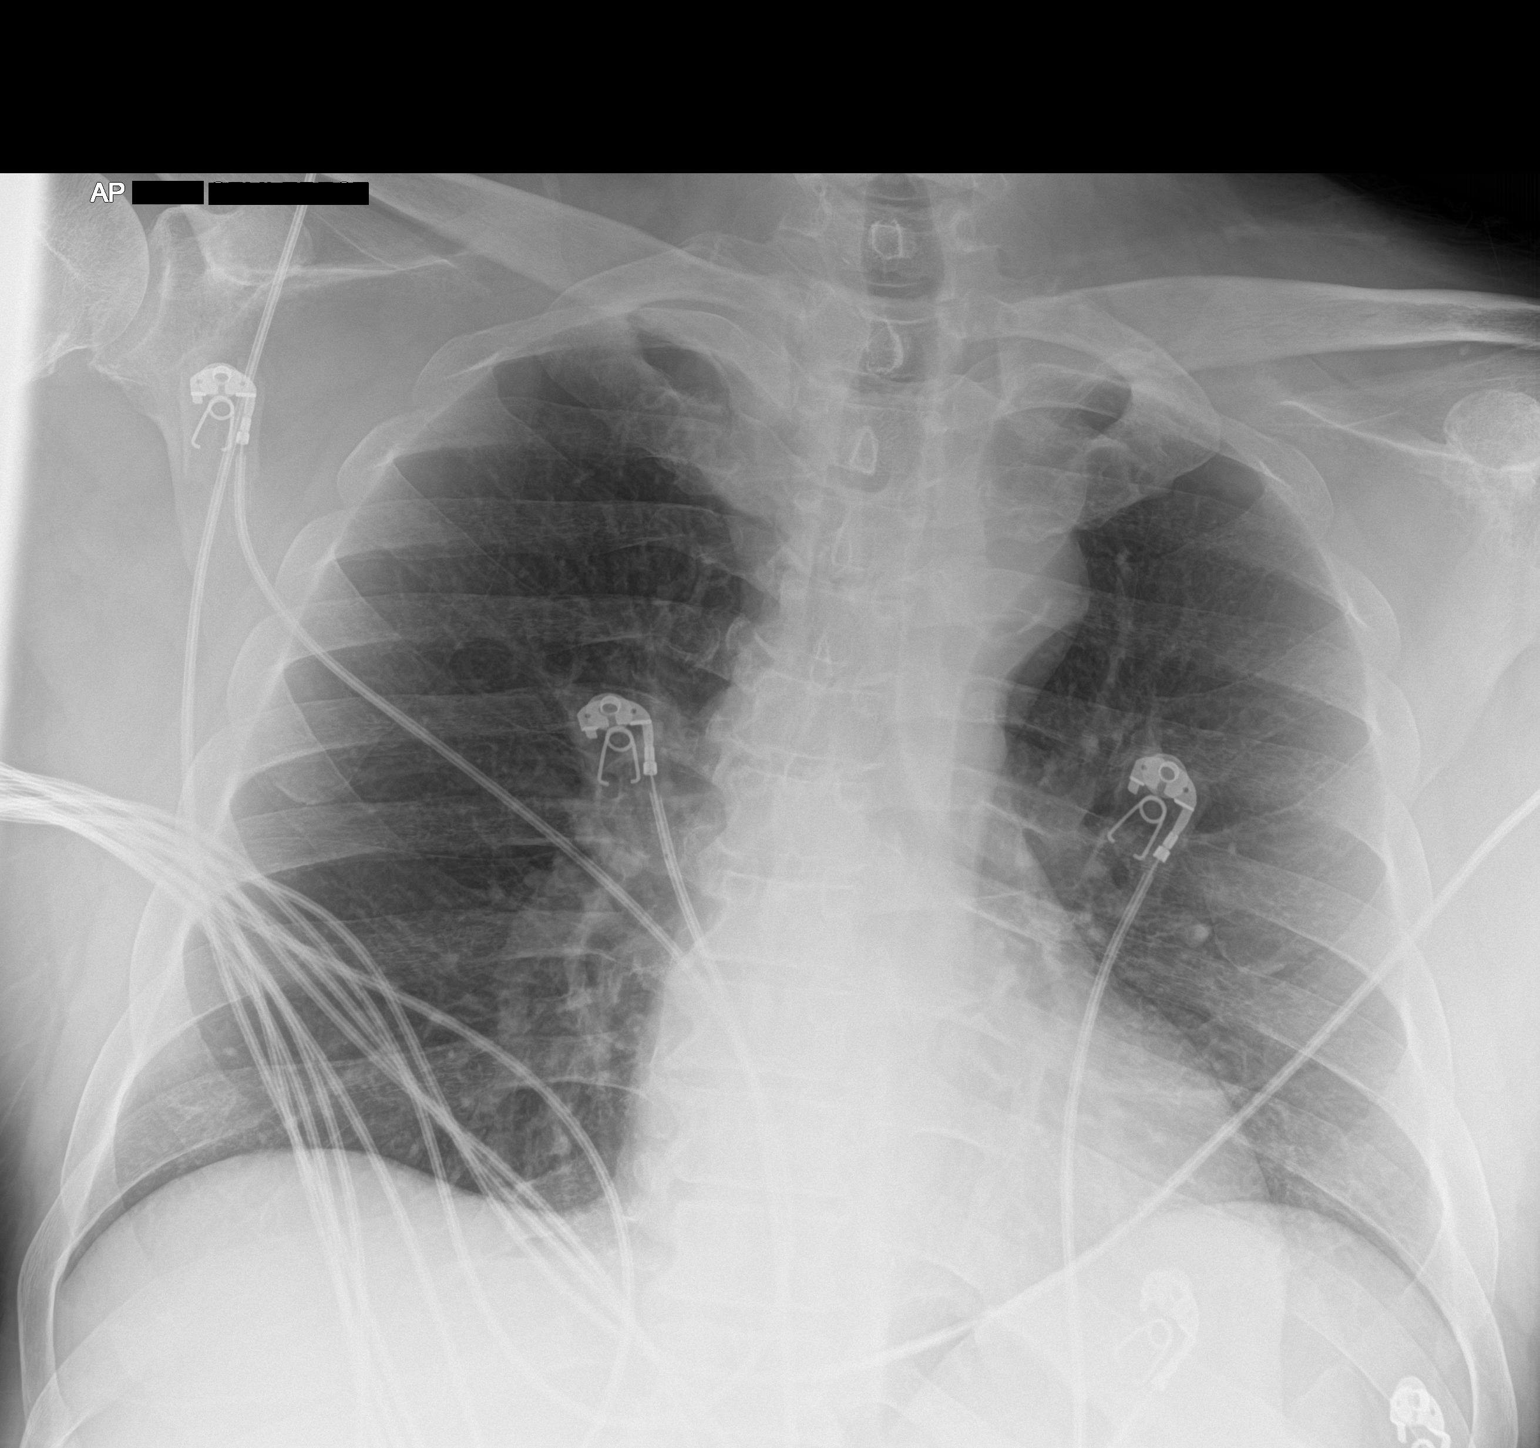

[1 of 1 positions shown; findings below may reference images not displayed]

FINDINGS: Cardiac silhouette is normal in size. No mediastinal or hilar
masses.

Clear lungs.  No convincing pleural effusion and no pneumothorax.

Skeletal structures are grossly intact.
IMPRESSION: No active disease.

## 2021-04-03 MED ORDER — SODIUM CHLORIDE 0.9 % IV BOLUS
1000.0000 mL | Freq: Once | INTRAVENOUS | Status: AC
  Administered 2021-04-03: 1000 mL via INTRAVENOUS

## 2021-04-03 MED ORDER — ONDANSETRON 4 MG PO TBDP: ORAL_TABLET | ORAL | 0 refills | Status: DC

## 2021-04-03 MED ORDER — LABETALOL HCL 5 MG/ML IV SOLN
10.0000 mg | Freq: Once | INTRAVENOUS | Status: AC
  Administered 2021-04-03: 10 mg via INTRAVENOUS
  Filled 2021-04-03: qty 4

## 2021-04-03 MED ORDER — ALUM & MAG HYDROXIDE-SIMETH 200-200-20 MG/5ML PO SUSP
30.0000 mL | Freq: Once | ORAL | Status: AC
  Administered 2021-04-03: 30 mL via ORAL
  Filled 2021-04-03: qty 30

## 2021-04-03 MED ORDER — ONDANSETRON HCL 4 MG/2ML IJ SOLN
4.0000 mg | Freq: Once | INTRAMUSCULAR | Status: AC
  Administered 2021-04-03: 4 mg via INTRAVENOUS
  Filled 2021-04-03: qty 2

## 2021-04-03 NOTE — Discharge Instructions (Signed)
Follow up with your family doc and neurologist.  Please return for worsening symptoms chest pain trouble breathing headache worsening stroke symptoms.  Take your medications as prescribed.  I prescribed you nausea medicine.

## 2021-04-03 NOTE — ED Provider Notes (Signed)
[place] EMERGENCY DEPARTMENT Provider Note   CSN: [id number] Arrival date & time: 04/03/21  1141     History Chief Complaint  Patient presents with   Nausea    Ryan Hughes is a 55 y.o. male.  55 yo M with a chief complaint of nausea and vomiting.  Going on since yesterday afternoon.  The patient is aphasic and so has trouble providing much history but is able to shake no to abdominal pain chest pain shortness of breath fevers or chills cough or congestion.  Level 5 caveat aphasia.       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   Intractable nausea and vomiting 08/05/2020   HTN (hypertension) 08/05/2020   Acute arterial ischemic stroke, vertebrobasilar, thalamic, left ([place]) 04/25/2020   Mixed hyperlipidemia due to type 2 diabetes mellitus ([place]) 04/25/2020   Benign prostatic hyperplasia (BPH) with straining on urination 04/25/2020   Nicotine dependence, cigarettes, uncomplicated 26/83/4196   Stroke ([place]) 04/25/2020   Rash 03/19/2013   Urinary hesitancy 03/19/2013   Back spasm 05/16/2012   Foot pain 07/18/2011   Diabetes mellitus ([place]) 06/18/2011   Tingling in extremities 06/18/2011    No past surgical history on file.     Family History  Problem Relation Age of Onset   Stroke Mother     Social History   Tobacco Use   Smoking status: Every Day    Packs/day: 0.50    Types: Cigarettes   Smokeless tobacco: Never  Vaping Use   Vaping Use: Never used  Substance Use Topics   Alcohol use: Yes   Drug use: Yes    Types: Marijuana    Home Medications Prior to Admission medications   Medication Sig Start Date End Date Taking? Authorizing  Provider  ondansetron (ZOFRAN-ODT) 4 MG disintegrating tablet 43m ODT q4 hours prn nausea/vomit 04/03/21  Yes FDeno Etienne DO  aspirin 325 MG tablet Take 1 tablet (325 mg total) by mouth daily. 03/29/21   AAline August MD  atorvastatin (LIPITOR) 80 MG tablet Take 1 tablet (80 mg total) by mouth at bedtime. 03/28/21   AAline August MD  blood glucose meter kit and supplies KIT Dispense based on patient and insurance preference. Use up to four times daily as directed. (FOR ICD-9 250.00, 250.01). 04/27/20   GDwyane Dee MD  Blood Glucose Monitoring Suppl (TRUE METRIX METER) w/Device KIT USE UP TO 4 TIMES DAILY AS DIRECTED 04/27/20 04/27/21  GDwyane Dee MD  clopidogrel (PLAVIX) 75 MG tablet Take 1 tablet (75 mg total) by mouth daily for 21 days. 03/29/21 04/19/21  AAline August MD  folic acid (FOLVITE) 1 MG tablet Take 1 tablet (1 mg total) by mouth daily. 03/29/21   AAline August MD  glucose blood test strip USE UP TO FOUR TIMES DAILY AS DIRECTED 04/27/20 04/27/21  GDwyane Dee MD  insulin glargine (LANTUS) 100 UNIT/ML Solostar Pen Inject 20 Units into the skin daily. 03/28/21   AAline August MD  Insulin Pen Needle 31G X 5 MM MISC Lantus 20 units daily 03/28/21   AAline August MD  lisinopril (ZESTRIL) 20 MG tablet Take 1 tablet (20 mg total) by mouth daily. 03/31/21 04/30/21  AAline August MD  metFORMIN (  GLUCOPHAGE) 1000 MG tablet Take 1 tablet (1,000 mg total) by mouth 2 (two) times daily with a meal. 03/28/21 04/28/21  Aline August, MD  Multiple Vitamin (MULTIVITAMIN WITH MINERALS) TABS tablet Take 1 tablet by mouth daily. 03/29/21   Aline August, MD  thiamine 100 MG tablet Take 1 tablet (100 mg total) by mouth daily. 03/29/21   Aline August, MD  TRUE METRIX BLOOD GLUCOSE TEST test strip  04/27/20   [provider]  TRUEplus Lancets 28G MISC USE UP TO 4 TIMES DAILY AS DIRECTED 04/27/20 04/27/21  Dwyane Dee, MD    Allergies    Pork-derived products and  Shellfish-derived products  Review of Systems   Review of Systems  Unable to perform ROS: Patient nonverbal   Physical Exam Updated Vital Signs BP (!) 181/111 (BP Location: Left Arm)   Pulse 100   Temp 98.3 F (36.8 C) (Oral)   Resp 17   Ht 5' 7"  (1.702 m)   Wt 85.7 kg   SpO2 98%   BMI 29.60 kg/m   Physical Exam Vitals and nursing note reviewed.  Constitutional:      Appearance: He is well-developed.  HENT:     Head: Normocephalic and atraumatic.  Eyes:     Pupils: Pupils are equal, round, and reactive to light.  Neck:     Vascular: No JVD.  Cardiovascular:     Rate and Rhythm: Normal rate and regular rhythm.     Heart sounds: No murmur heard.   No friction rub. No gallop.  Pulmonary:     Effort: No respiratory distress.     Breath sounds: No wheezing.  Abdominal:     General: There is no distension.     Tenderness: There is no abdominal tenderness. There is no guarding or rebound.     Comments: No appreciable abdominal discomfort on exam.  Musculoskeletal:        General: Normal range of motion.     Cervical back: Normal range of motion and neck supple.     Comments: Right-sided weakness  Skin:    Coloration: Skin is not pale.     Findings: No rash.  Neurological:     Mental Status: He is alert and oriented to person, place, and time.  Psychiatric:        Behavior: Behavior normal.    ED Results / Procedures / Treatments   Labs (all labs ordered are listed, but only abnormal results are displayed) Labs Reviewed  CBC WITH DIFFERENTIAL/PLATELET - Abnormal; Notable for the following components:      Result Value   WBC 10.9 (*)    Neutro Abs 8.9 (*)    All other components within normal limits  COMPREHENSIVE METABOLIC PANEL - Abnormal; Notable for the following components:   Potassium 3.1 (*)    Glucose, Bld 173 (*)    Anion gap 16 (*)    All other components within normal limits  LIPASE, BLOOD    EKG EKG Interpretation  Date/Time:  Saturday  April 03 2021 13:23:41 EST Ventricular Rate:  103 PR Interval:  176 QRS Duration: 120 QT Interval:  368 QTC Calculation: 482 R Axis:   124 Text Interpretation: Sinus tachycardia Probable left atrial enlargement Left posterior fascicular block Inferior infarct, old No significant change since last tracing Confirmed by Deno Etienne [phone]) on 04/03/2021 1:38:27 PM  Radiology DG Chest Port 1 View  Result Date: 04/03/2021 CLINICAL DATA:  Nausea. EXAM: PORTABLE CHEST 1 VIEW COMPARISON:  04/24/2020 FINDINGS: Cardiac  silhouette is normal in size. No mediastinal or hilar masses. Clear lungs.  No convincing pleural effusion and no pneumothorax. Skeletal structures are grossly intact. IMPRESSION: No active disease. Electronically Signed   By: Lajean Manes M.D.   On: 04/03/2021 12:43    Procedures Procedures   Medications Ordered in ED Medications  sodium chloride 0.9 % bolus 1,000 mL (0 mLs Intravenous Stopped 04/03/21 1330)  ondansetron (ZOFRAN) injection 4 mg (4 mg Intravenous Given 04/03/21 1229)  alum & mag hydroxide-simeth (MAALOX/MYLANTA) 200-200-20 MG/5ML suspension 30 mL (30 mLs Oral Given 04/03/21 1230)  labetalol (NORMODYNE) injection 10 mg (10 mg Intravenous Given 04/03/21 1325)    ED Course  I have reviewed the triage vital signs and the nursing notes.  Pertinent labs & imaging results that were available during my care of the patient were reviewed by me and considered in my medical decision making (see chart for details).    MDM Rules/Calculators/A&P                           55 yo M recently hospitalized for severe stroke comes in with a chief complaint of nausea.  Limited history as the patient is aphasic.  We will obtain a laboratory evaluation give a bolus of fluids and Zofran and GI cocktail reassess.  Patient feeling much better on reassessment.  He was significantly hypertensive here and so I give him a dose of labetalol.  He does think that he has been compliant  with his medications.  Again he denies any worsening stroke symptoms chest pain difficulty breathing.  Feels like his nausea is much better.  Will prescribe Zofran.  Have him follow-up with his PCP and neurologist.  2:27 PM:  I have discussed the diagnosis/risks/treatment options with the patient and believe the pt to be eligible for discharge home to follow-up with PCP, neuro. We also discussed returning to the ED immediately if new or worsening sx occur. We discussed the sx which are most concerning (e.g., sudden worsening pain, fever, inability to tolerate by mouth) that necessitate immediate return. Medications administered to the patient during their visit and any new prescriptions provided to the patient are listed below.  Medications given during this visit Medications  sodium chloride 0.9 % bolus 1,000 mL (0 mLs Intravenous Stopped 04/03/21 1330)  ondansetron (ZOFRAN) injection 4 mg (4 mg Intravenous Given 04/03/21 1229)  alum & mag hydroxide-simeth (MAALOX/MYLANTA) 200-200-20 MG/5ML suspension 30 mL (30 mLs Oral Given 04/03/21 1230)  labetalol (NORMODYNE) injection 10 mg (10 mg Intravenous Given 04/03/21 1325)     The patient appears reasonably screen and/or stabilized for discharge and I doubt any other medical condition or other [facility] requiring further screening, evaluation, or treatment in the ED at this time prior to discharge.   Final Clinical Impression(s) / ED Diagnoses Final diagnoses:  Nausea    Rx / DC Orders ED Discharge Orders          Ordered    ondansetron (ZOFRAN-ODT) 4 MG disintegrating tablet        04/03/21 [place], [place], DO 04/03/21 1427

## 2021-04-03 NOTE — ED Triage Notes (Signed)
Pt here via EMS with C/O of Nausea. Family reports pt has "not been acting right" . Family did not elaborate to EMS. Recently d/c from Stroke. Left sided weakness and facial droop baseline deficits.   190/110 HR 116 RR 20 98% RA CBG 192

## 2021-04-03 NOTE — ED Notes (Signed)
Spoke to pt family. Amy will be coming to pick him up.

## 2021-04-10 ENCOUNTER — Other Ambulatory Visit: Payer: Self-pay

## 2021-04-10 ENCOUNTER — Emergency Department (HOSPITAL_COMMUNITY)
Admission: EM | Admit: 2021-04-10 | Discharge: 2021-04-11 | Disposition: A | Discharge status: Home / Self Care | Payer: Medicaid Other | Attending: Emergency Medicine | Admitting: Emergency Medicine

## 2021-04-10 ENCOUNTER — Encounter (HOSPITAL_COMMUNITY): Payer: Self-pay | Admitting: *Deleted

## 2021-04-10 DIAGNOSIS — E119 Type 2 diabetes mellitus without complications: Secondary | ICD-10-CM | POA: Diagnosis not present

## 2021-04-10 DIAGNOSIS — Z7901 Long term (current) use of anticoagulants: Secondary | ICD-10-CM | POA: Insufficient documentation

## 2021-04-10 DIAGNOSIS — Z79899 Other long term (current) drug therapy: Secondary | ICD-10-CM | POA: Insufficient documentation

## 2021-04-10 DIAGNOSIS — Z7982 Long term (current) use of aspirin: Secondary | ICD-10-CM | POA: Insufficient documentation

## 2021-04-10 DIAGNOSIS — I10 Essential (primary) hypertension: Secondary | ICD-10-CM | POA: Diagnosis not present

## 2021-04-10 DIAGNOSIS — Z794 Long term (current) use of insulin: Secondary | ICD-10-CM | POA: Insufficient documentation

## 2021-04-10 DIAGNOSIS — K209 Esophagitis, unspecified without bleeding: Secondary | ICD-10-CM | POA: Diagnosis not present

## 2021-04-10 DIAGNOSIS — Z7984 Long term (current) use of oral hypoglycemic drugs: Secondary | ICD-10-CM | POA: Insufficient documentation

## 2021-04-10 DIAGNOSIS — F17210 Nicotine dependence, cigarettes, uncomplicated: Secondary | ICD-10-CM | POA: Diagnosis not present

## 2021-04-10 DIAGNOSIS — R1110 Vomiting, unspecified: Secondary | ICD-10-CM | POA: Diagnosis present

## 2021-04-10 LAB — CBC WITH DIFFERENTIAL/PLATELET
Abs Immature Granulocytes: 0.02 10*3/uL (ref 0.00–0.07)
Basophils Absolute: 0 10*3/uL (ref 0.0–0.1)
Basophils Relative: 0 %
Eosinophils Absolute: 0 10*3/uL (ref 0.0–0.5)
Eosinophils Relative: 0 %
HCT: 51.1 % (ref 39.0–52.0)
Hemoglobin: 17.9 g/dL — ABNORMAL HIGH (ref 13.0–17.0)
Immature Granulocytes: 0 %
Lymphocytes Relative: 21 %
Lymphs Abs: 2.4 10*3/uL (ref 0.7–4.0)
MCH: 30.2 pg (ref 26.0–34.0)
MCHC: 35 g/dL (ref 30.0–36.0)
MCV: 86.3 fL (ref 80.0–100.0)
Monocytes Absolute: 0.5 10*3/uL (ref 0.1–1.0)
Monocytes Relative: 4 %
Neutro Abs: 8.3 10*3/uL — ABNORMAL HIGH (ref 1.7–7.7)
Neutrophils Relative %: 75 %
Platelets: 348 10*3/uL (ref 150–400)
RBC: 5.92 MIL/uL — ABNORMAL HIGH (ref 4.22–5.81)
RDW: 12.9 % (ref 11.5–15.5)
WBC: 11.2 10*3/uL — ABNORMAL HIGH (ref 4.0–10.5)
nRBC: 0 % (ref 0.0–0.2)

## 2021-04-10 LAB — COMPREHENSIVE METABOLIC PANEL
ALT: 15 U/L (ref 0–44)
AST: 18 U/L (ref 15–41)
Albumin: 4.7 g/dL (ref 3.5–5.0)
Alkaline Phosphatase: 99 U/L (ref 38–126)
Anion gap: 14 (ref 5–15)
BUN: 11 mg/dL (ref 6–20)
CO2: 34 mmol/L — ABNORMAL HIGH (ref 22–32)
Calcium: 10 mg/dL (ref 8.9–10.3)
Chloride: 93 mmol/L — ABNORMAL LOW (ref 98–111)
Creatinine, Ser: 0.93 mg/dL (ref 0.61–1.24)
GFR, Estimated: >60 mL/min (ref >60)
Glucose, Bld: 267 mg/dL — ABNORMAL HIGH (ref 70–99)
Potassium: 3 mmol/L — ABNORMAL LOW (ref 3.5–5.1)
Sodium: 141 mmol/L (ref 135–145)
Total Bilirubin: 0.7 mg/dL (ref 0.3–1.2)
Total Protein: 8.5 g/dL — ABNORMAL HIGH (ref 6.5–8.1)

## 2021-04-10 LAB — LIPASE, BLOOD: Lipase: 25 U/L (ref 11–51)

## 2021-04-10 MED ORDER — ONDANSETRON 4 MG PO TBDP
8.0000 mg | ORAL_TABLET | Freq: Once | ORAL | Status: AC
  Administered 2021-04-10: 8 mg via ORAL
  Filled 2021-04-10: qty 2

## 2021-04-10 NOTE — ED Triage Notes (Signed)
The pt arrived by [facility] from home pt c/o n  v and diarrhea for one week

## 2021-04-10 NOTE — ED Provider Notes (Signed)
Emergency Medicine Provider Triage Evaluation Note  Ryan Hughes , a 55 y.o. male  was evaluated in triage.  Pt complains of nausea, vomiting, and dizziness for about 1 week.  He says that the vomit has been brown.  He denies any abdominal pain, diarrhea, constipation.  No melena or hematochezia.  He says the dizziness has been intermittent.  He is not able to state what the triggers are.  Review of Systems  Positive: See above Negative:   Physical Exam  BP (!) 179/107   Pulse (!) 122   Temp 99.3 F (37.4 C)   Resp 18   Ht 5\' 7"  (1.702 m)   Wt 85.7 kg   SpO2 98%   BMI 29.59 kg/m  Gen:   Awake, no distress   Resp:  Normal effort  MSK:   Moves extremities without difficulty  Other:  Abdomen is soft and nontender.  Medical Decision Making  Medically screening exam initiated at 5:15 PM.  Appropriate orders placed.  Ryan Hughes was informed that the remainder of the evaluation will be completed by another provider, this initial triage assessment does not replace that evaluation, and the importance of remaining in the ED until their evaluation is complete.     Ryan Hughes 04/10/21 1716    14/03/22, MD 04/10/21 [phone]

## 2021-04-10 NOTE — ED Triage Notes (Signed)
Pt alert and oriented  cbg from [facility] 183

## 2021-04-11 ENCOUNTER — Emergency Department (HOSPITAL_COMMUNITY): Payer: Medicaid Other

## 2021-04-11 IMAGING — CT CT ABD-PELV W/ CM
2 of 5 series · 16 of 46 positions shown, 18 images · IV contrast (Omni 300)
Comparison: CT Abdomen and Pelvis [DATE].

CLINICAL DATA: 55-year-old male with vomiting and diarrhea.
Dizziness. Hyperglycemia.

EXAM:
CT ABDOMEN AND PELVIS WITH CONTRAST
TECHNIQUE: Multidetector CT imaging of the abdomen and pelvis was performed
using the standard protocol following bolus administration of
intravenous contrast.
CONTRAST:  100mL OMNIPAQUE IOHEXOL 300 MG/ML  SOLN

[Series 3: a/p w/ 5mm · axial · 0.81mm/px · z∈[+845,+1295]mm · 13 of 102 slices shown, 15 images]
[im 6/102  soft-tissue]
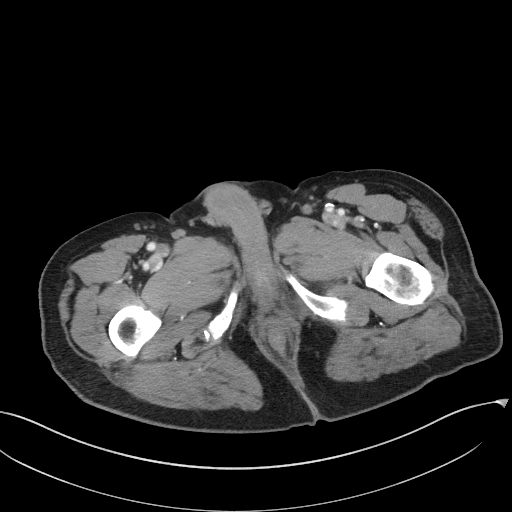
[im 6/102  bone]
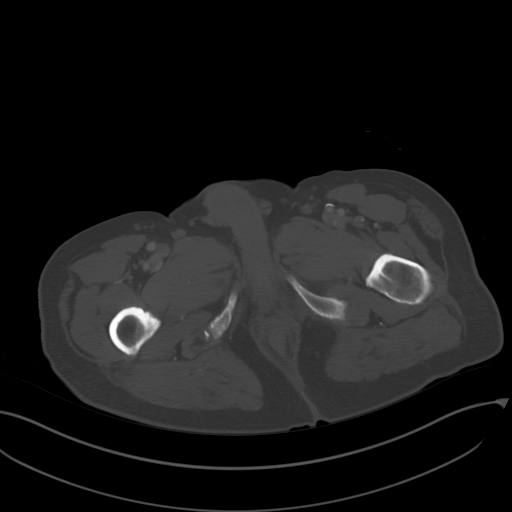
[im 16/102  soft-tissue]
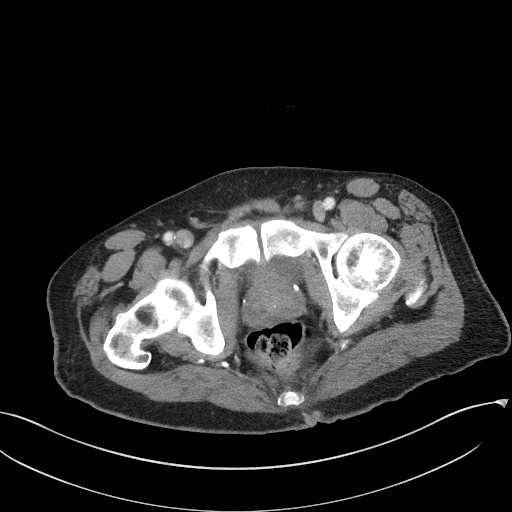
[im 22/102  soft-tissue]
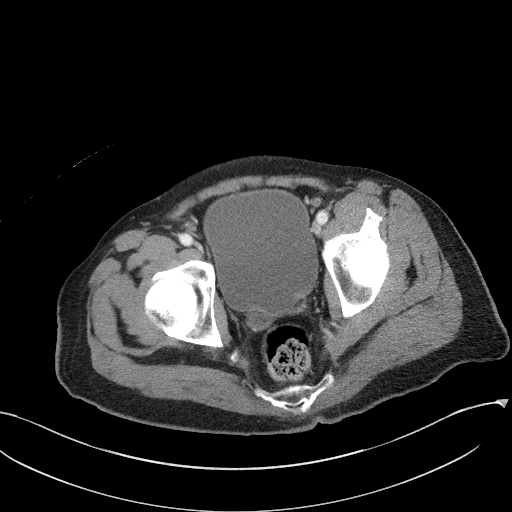
[im 27/102  soft-tissue]
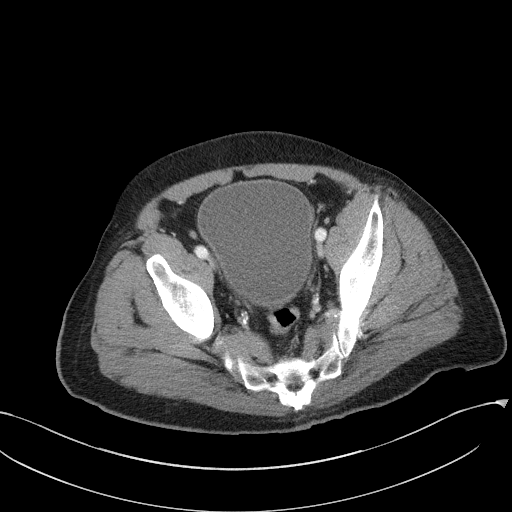
[im 38/102  soft-tissue]
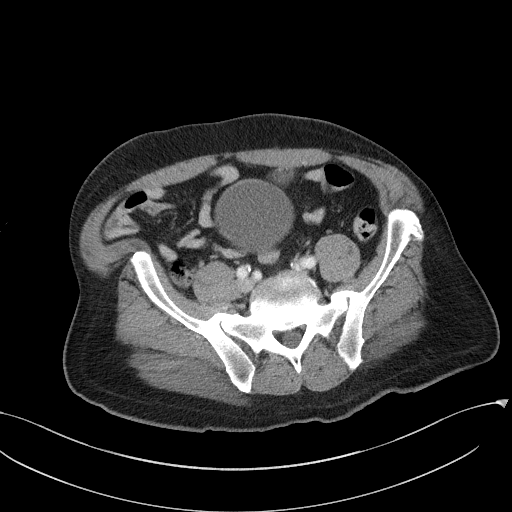
[im 43/102  soft-tissue]
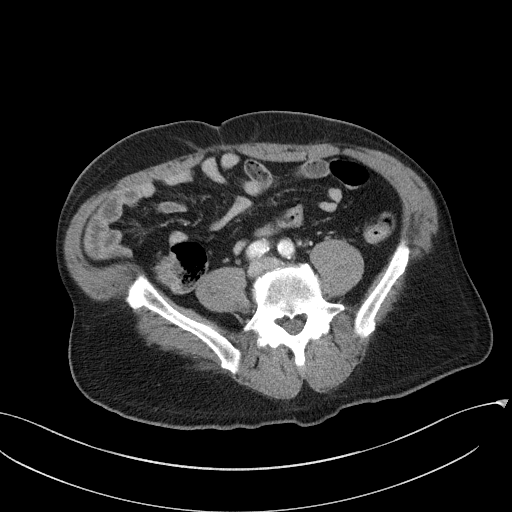
[im 54/102  soft-tissue]
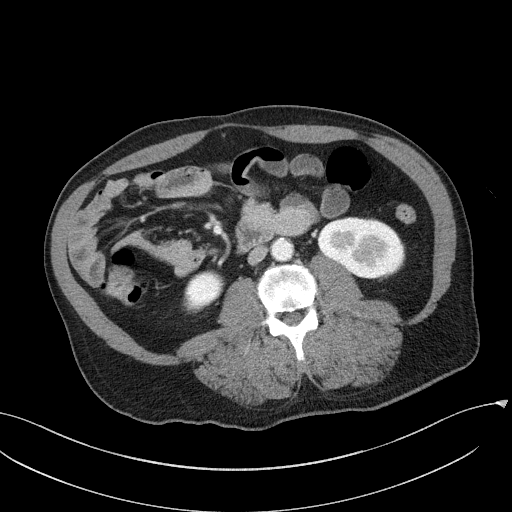
[im 59/102  soft-tissue]
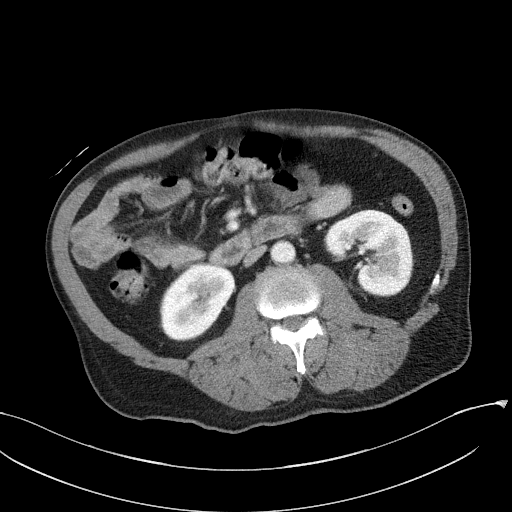
[im 64/102  soft-tissue]
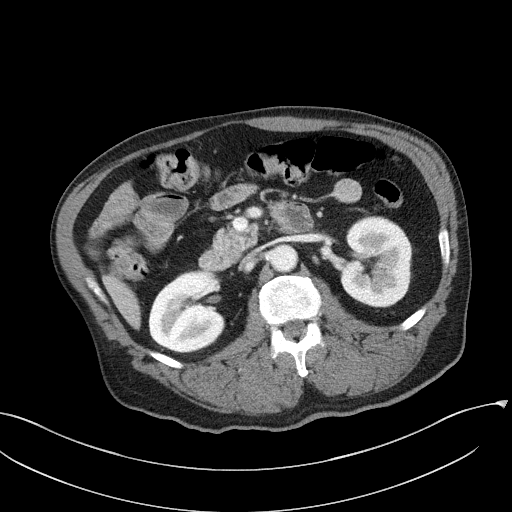
[im 64/102  bone]
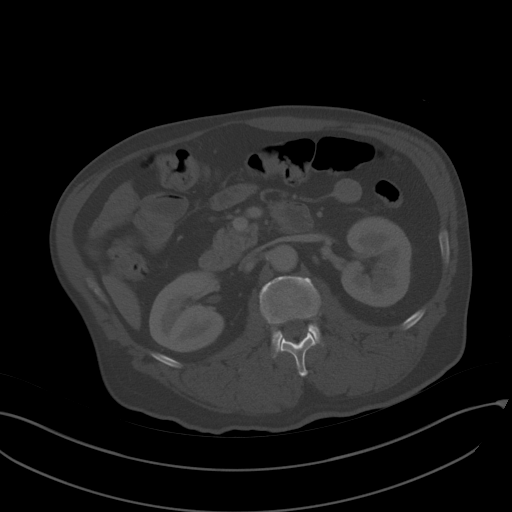
[im 75/102  soft-tissue]
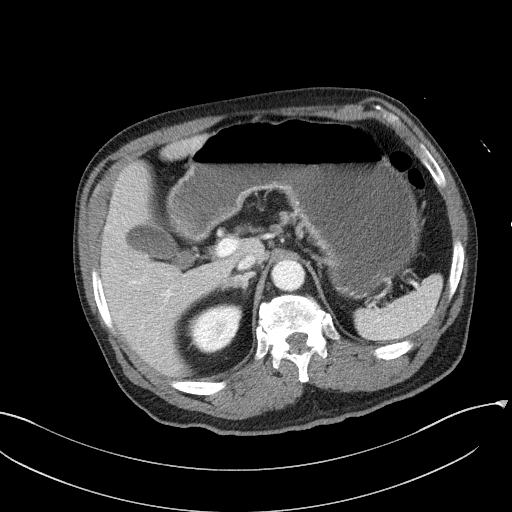
[im 80/102  soft-tissue]
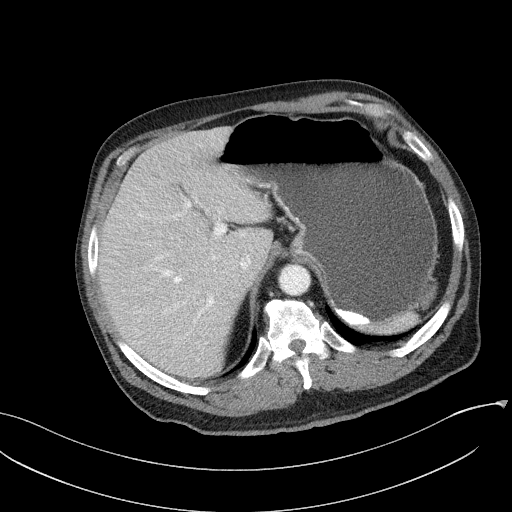
[im 86/102  soft-tissue]
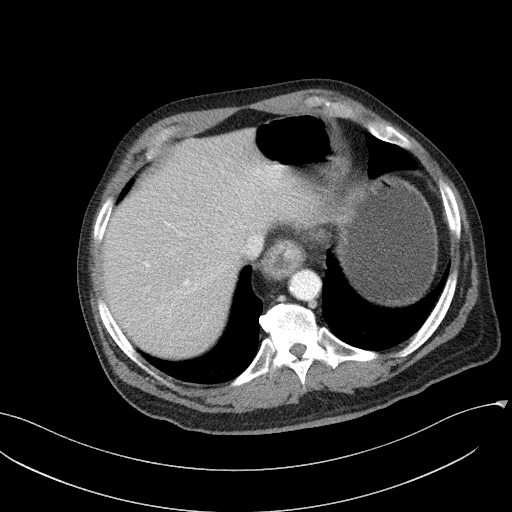
[im 96/102  soft-tissue]
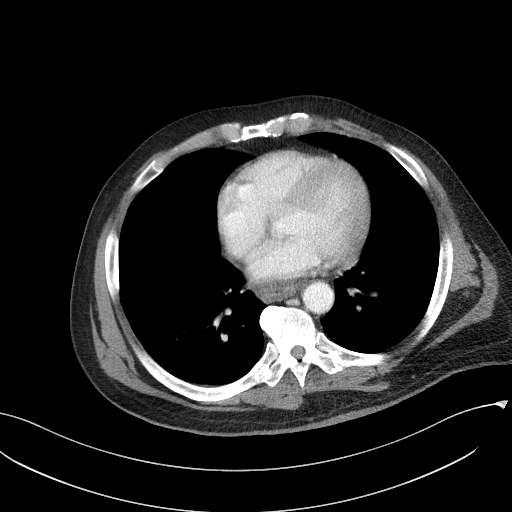

[Series 6: a/p w/ cor · coronal · 0.87mm/px · 3 of 151 slices shown]
[im 51/151  soft-tissue]
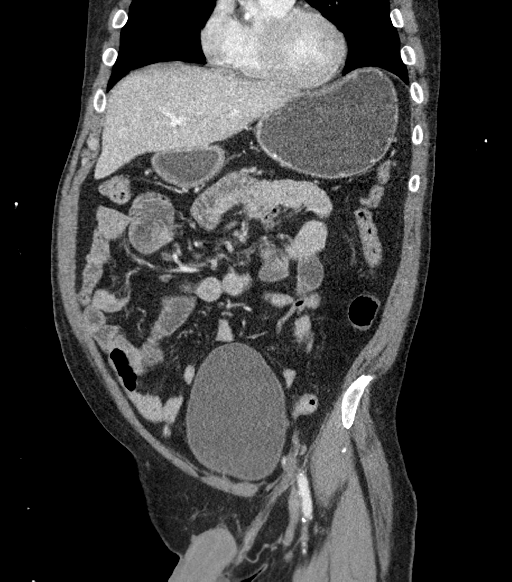
[im 67/151  soft-tissue]
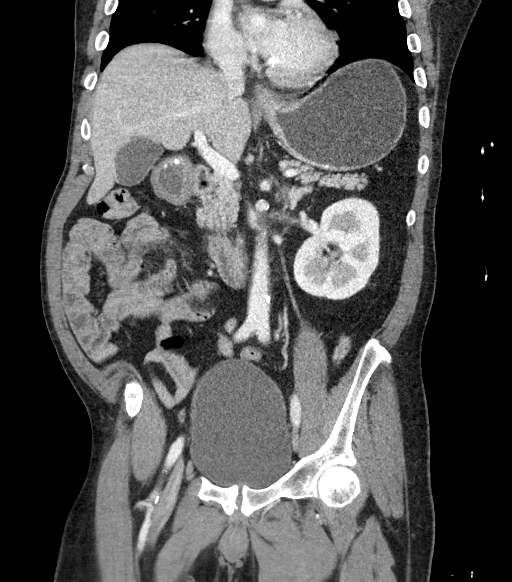
[im 84/151  soft-tissue]
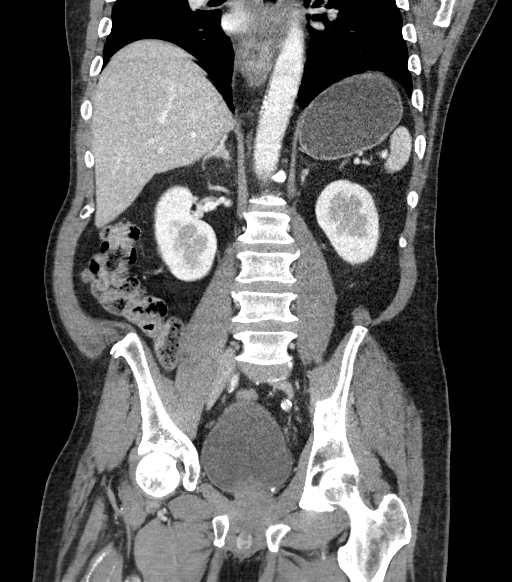

[16 of 46 positions shown; findings below may reference images not displayed]

FINDINGS: Lower chest: Moderate to severe circumferential wall thickening
throughout the visible thoracic esophagus continuing to the GE
junction where there is associated irregular mucosal
hyperenhancement (series 3, image 17). But otherwise negative
visible mediastinum. Negative lung bases. No pericardial or pleural
effusion.

Hepatobiliary: Negative liver and gallbladder.

Pancreas: Partially atrophied, otherwise negative.

Spleen: Negative.

Adrenals/Urinary Tract: Negative aside from mildly distended urinary
bladder.

Stomach/Bowel: Negative large bowel aside from redundancy. Only mild
retained stool. Normal appendix on coronal image 75. Decompressed
and negative terminal ileum. No dilated small bowel.

Moderately distended stomach with fluid. Duodenum decompressed. No
gastro duodenal inflammation identified. No free air or free fluid.

Vascular/Lymphatic: Aortoiliac calcified atherosclerosis. Major
arterial structures are patent. Proximal femoral artery
atherosclerosis. Portal venous system is patent. No lymphadenopathy.

Reproductive: Negative.

Other: No pelvic free fluid.

Musculoskeletal: No acute osseous abnormality identified. Lower
lumbar facet degeneration.
IMPRESSION: 1. Moderate to severe Esophagitis, with associated abnormal mucosa
at the GE junction. Superimposed fluid distended stomach. But no
gastroduodenal inflammation. Follow-up EGD may be valuable.

2. No other acute or inflammatory process identified in the abdomen
or pelvis. Normal appendix.

3. Aortic Atherosclerosis ([JJ]-[JJ]).

## 2021-04-11 MED ORDER — LIDOCAINE VISCOUS HCL 2 % MT SOLN
15.0000 mL | Freq: Once | OROMUCOSAL | Status: AC
  Administered 2021-04-11: 07:00:00 15 mL via ORAL
  Filled 2021-04-11: qty 15

## 2021-04-11 MED ORDER — ALUM & MAG HYDROXIDE-SIMETH 200-200-20 MG/5ML PO SUSP
30.0000 mL | Freq: Once | ORAL | Status: AC
  Administered 2021-04-11: 07:00:00 30 mL via ORAL
  Filled 2021-04-11: qty 30

## 2021-04-11 MED ORDER — IOHEXOL 300 MG/ML  SOLN
100.0000 mL | Freq: Once | INTRAMUSCULAR | Status: AC | PRN
  Administered 2021-04-11: 06:00:00 100 mL via INTRAVENOUS

## 2021-04-11 MED ORDER — ONDANSETRON 4 MG PO TBDP: ORAL_TABLET | ORAL | 0 refills | Status: DC

## 2021-04-11 MED ORDER — ONDANSETRON HCL 4 MG/2ML IJ SOLN
4.0000 mg | Freq: Once | INTRAMUSCULAR | Status: AC
  Administered 2021-04-11: 05:00:00 4 mg via INTRAVENOUS
  Filled 2021-04-11: qty 2

## 2021-04-11 MED ORDER — PANTOPRAZOLE SODIUM 20 MG PO TBEC: 20.0000 mg | DELAYED_RELEASE_TABLET | Freq: Every day | ORAL | 0 refills | Status: DC

## 2021-04-11 MED ORDER — MAGNESIUM SULFATE 2 GM/50ML IV SOLN
2.0000 g | Freq: Once | INTRAVENOUS | Status: AC
  Administered 2021-04-11: 05:00:00 2 g via INTRAVENOUS
  Filled 2021-04-11: qty 50

## 2021-04-11 MED ORDER — POTASSIUM CHLORIDE 10 MEQ/100ML IV SOLN
10.0000 meq | Freq: Once | INTRAVENOUS | Status: AC
  Administered 2021-04-11: 05:00:00 10 meq via INTRAVENOUS
  Filled 2021-04-11: qty 100

## 2021-04-11 MED ORDER — POTASSIUM CHLORIDE CRYS ER 20 MEQ PO TBCR
40.0000 meq | EXTENDED_RELEASE_TABLET | Freq: Once | ORAL | Status: AC
  Administered 2021-04-11: 05:00:00 40 meq via ORAL
  Filled 2021-04-11: qty 2

## 2021-04-11 MED ORDER — LACTATED RINGERS IV BOLUS
1000.0000 mL | Freq: Once | INTRAVENOUS | Status: AC
  Administered 2021-04-11: 05:00:00 1000 mL via INTRAVENOUS

## 2021-04-11 MED ORDER — ALUM & MAG HYDROXIDE-SIMETH 400-400-40 MG/5ML PO SUSP: 15.0000 mL | Freq: Four times a day (QID) | ORAL | 0 refills | Status: DC | PRN

## 2021-04-11 NOTE — ED Notes (Signed)
Patient transported to CT 

## 2021-04-11 NOTE — ED Provider Notes (Signed)
[facility] EMERGENCY DEPARTMENT Provider Note   CSN: [id number] Arrival date & time: 04/10/21  1643     History Chief Complaint  Patient presents with   Emesis    Ryan Hughes is a 55 y.o. male.   Emesis Severity:  Mild Duration:  1 day Timing:  Intermittent Progression:  Partially resolved Chronicity:  Recurrent Recent urination:  Normal Relieved by:  Nothing Worsened by:  Nothing Ineffective treatments:  None tried Associated symptoms: no abdominal pain, no cough and no myalgias       Past Medical History:  Diagnosis Date   Diabetes mellitus without complication ([place])    Hypertension    Nicotine dependence, cigarettes, uncomplicated 38/02/1750    Patient Active Problem List   Diagnosis Date Noted   Acute CVA (cerebrovascular accident) ([place]) 03/27/2021   Type 2 diabetes mellitus with vascular disease ([place]) 03/27/2021   Intractable nausea and vomiting 08/05/2020   HTN (hypertension) 08/05/2020   Acute arterial ischemic stroke, vertebrobasilar, thalamic, left ([place]) 04/25/2020   Mixed hyperlipidemia due to type 2 diabetes mellitus ([place]) 04/25/2020   Benign prostatic hyperplasia (BPH) with straining on urination 04/25/2020   Nicotine dependence, cigarettes, uncomplicated 02/58/5277   Stroke ([place]) 04/25/2020   Rash 03/19/2013   Urinary hesitancy 03/19/2013   Back spasm 05/16/2012   Foot pain 07/18/2011   Diabetes mellitus ([place]) 06/18/2011   Tingling in extremities 06/18/2011    History reviewed. No pertinent surgical history.     Family History  Problem Relation Age of Onset   Stroke Mother     Social History   Tobacco Use   Smoking status: Every Day    Packs/day: 0.50    Types: Cigarettes   Smokeless tobacco: Never  Vaping Use   Vaping Use: Never used  Substance Use Topics   Alcohol use: Yes   Drug use: Yes    Types: Marijuana    Home Medications Prior to Admission medications   Medication Sig Start Date End Date  Taking? Authorizing Provider  alum & mag hydroxide-simeth (MAALOX PLUS) 400-400-40 MG/5ML suspension Take 15 mLs by mouth every 6 (six) hours as needed for indigestion. 04/11/21  Yes Deaglan Lile, Corene Cornea, MD  ondansetron (ZOFRAN-ODT) 4 MG disintegrating tablet 24m ODT q4 hours prn nausea/vomit 04/11/21  Yes Regine Christian, JCorene Cornea MD  pantoprazole (PROTONIX) 20 MG tablet Take 1 tablet (20 mg total) by mouth daily. 04/11/21 05/11/21 Yes Rembert Browe, JCorene Cornea MD  aspirin 325 MG tablet Take 1 tablet (325 mg total) by mouth daily. 03/29/21   AAline August MD  atorvastatin (LIPITOR) 80 MG tablet Take 1 tablet (80 mg total) by mouth at bedtime. 03/28/21   AAline August MD  blood glucose meter kit and supplies KIT Dispense based on patient and insurance preference. Use up to four times daily as directed. (FOR ICD-9 250.00, 250.01). 04/27/20   GDwyane Dee MD  Blood Glucose Monitoring Suppl (TRUE METRIX METER) w/Device KIT USE UP TO 4 TIMES DAILY AS DIRECTED 04/27/20 04/27/21  GDwyane Dee MD  clopidogrel (PLAVIX) 75 MG tablet Take 1 tablet (75 mg total) by mouth daily for 21 days. 03/29/21 04/19/21  AAline August MD  folic acid (FOLVITE) 1 MG tablet Take 1 tablet (1 mg total) by mouth daily. 03/29/21   AAline August MD  glucose blood test strip USE UP TO FOUR TIMES DAILY AS DIRECTED 04/27/20 04/27/21  GDwyane Dee MD  insulin glargine (LANTUS) 100 UNIT/ML Solostar Pen Inject 20 Units into the skin daily. 03/28/21   AAline August MD  Insulin Pen Needle 31G X 5 MM MISC Lantus 20 units daily 03/28/21   Aline August, MD  lisinopril (ZESTRIL) 20 MG tablet Take 1 tablet (20 mg total) by mouth daily. 03/31/21 04/30/21  Aline August, MD  metFORMIN (GLUCOPHAGE) 1000 MG tablet Take 1 tablet (1,000 mg total) by mouth 2 (two) times daily with a meal. 03/28/21 04/28/21  Aline August, MD  Multiple Vitamin (MULTIVITAMIN WITH MINERALS) TABS tablet Take 1 tablet by mouth daily. 03/29/21   Aline August, MD  thiamine 100 MG  tablet Take 1 tablet (100 mg total) by mouth daily. 03/29/21   Aline August, MD  TRUE METRIX BLOOD GLUCOSE TEST test strip  04/27/20   [provider]  TRUEplus Lancets 28G MISC USE UP TO 4 TIMES DAILY AS DIRECTED 04/27/20 04/27/21  Dwyane Dee, MD    Allergies    Pork-derived products and Shellfish-derived products  Review of Systems   Review of Systems  Unable to perform ROS: Patient nonverbal  Respiratory:  Negative for cough.   Gastrointestinal:  Positive for vomiting. Negative for abdominal pain.  Musculoskeletal:  Negative for myalgias.   Physical Exam Updated Vital Signs BP (!) 192/105   Pulse 69   Temp 99.3 F (37.4 C) (Oral)   Resp 16   Ht 5' 7"  (1.702 m)   Wt 85.7 kg   SpO2 91%   BMI 29.59 kg/m   Physical Exam Vitals and nursing note reviewed.  Constitutional:      Appearance: He is well-developed.  HENT:     Head: Normocephalic and atraumatic.     Mouth/Throat:     Mouth: Mucous membranes are moist.     Pharynx: Oropharynx is clear.  Eyes:     Pupils: Pupils are equal, round, and reactive to light.  Cardiovascular:     Rate and Rhythm: Normal rate.  Pulmonary:     Effort: Pulmonary effort is normal. No respiratory distress.  Abdominal:     General: Abdomen is flat. There is no distension.  Musculoskeletal:        General: Normal range of motion.     Cervical back: Normal range of motion.  Skin:    General: Skin is warm and dry.  Neurological:     Mental Status: He is alert. Mental status is at baseline.    ED Results / Procedures / Treatments   Labs (all labs ordered are listed, but only abnormal results are displayed) Labs Reviewed  CBC WITH DIFFERENTIAL/PLATELET - Abnormal; Notable for the following components:      Result Value   WBC 11.2 (*)    RBC 5.92 (*)    Hemoglobin 17.9 (*)    Neutro Abs 8.3 (*)    All other components within normal limits  COMPREHENSIVE METABOLIC PANEL - Abnormal; Notable for the following  components:   Potassium 3.0 (*)    Chloride 93 (*)    CO2 34 (*)    Glucose, Bld 267 (*)    Total Protein 8.5 (*)    All other components within normal limits  LIPASE, BLOOD    EKG None  Radiology CT ABDOMEN PELVIS W CONTRAST  Result Date: 04/11/2021 CLINICAL DATA:  55 year old male with vomiting and diarrhea. Dizziness. Hyperglycemia. EXAM: CT ABDOMEN AND PELVIS WITH CONTRAST TECHNIQUE: Multidetector CT imaging of the abdomen and pelvis was performed using the standard protocol following bolus administration of intravenous contrast. CONTRAST:  15m OMNIPAQUE IOHEXOL 300 MG/ML  SOLN COMPARISON:  CT Abdomen and Pelvis 08/05/2020. FINDINGS: Lower  chest: Moderate to severe circumferential wall thickening throughout the visible thoracic esophagus continuing to the GE junction where there is associated irregular mucosal hyperenhancement (series 3, image 17). But otherwise negative visible mediastinum. Negative lung bases. No pericardial or pleural effusion. Hepatobiliary: Negative liver and gallbladder. Pancreas: Partially atrophied, otherwise negative. Spleen: Negative. Adrenals/Urinary Tract: Negative aside from mildly distended urinary bladder. Stomach/Bowel: Negative large bowel aside from redundancy. Only mild retained stool. Normal appendix on coronal image 75. Decompressed and negative terminal ileum. No dilated small bowel. Moderately distended stomach with fluid. Duodenum decompressed. No gastro duodenal inflammation identified. No free air or free fluid. Vascular/Lymphatic: Aortoiliac calcified atherosclerosis. Major arterial structures are patent. Proximal femoral artery atherosclerosis. Portal venous system is patent. No lymphadenopathy. Reproductive: Negative. Other: No pelvic free fluid. Musculoskeletal: No acute osseous abnormality identified. Lower lumbar facet degeneration. IMPRESSION: 1. Moderate to severe Esophagitis, with associated abnormal mucosa at the GE junction. Superimposed  fluid distended stomach. But no gastroduodenal inflammation. Follow-up EGD may be valuable. 2. No other acute or inflammatory process identified in the abdomen or pelvis. Normal appendix. 3. Aortic Atherosclerosis (ICD10-I70.0). Electronically Signed   By: Genevie Ann M.D.   On: 04/11/2021 06:19    Procedures Procedures   Medications Ordered in ED Medications  ondansetron (ZOFRAN-ODT) disintegrating tablet 8 mg (8 mg Oral Given 04/10/21 1736)  lactated ringers bolus 1,000 mL (0 mLs Intravenous Stopped 04/11/21 0735)  ondansetron (ZOFRAN) injection 4 mg (4 mg Intravenous Given 04/11/21 0521)  potassium chloride 10 mEq in 100 mL IVPB (0 mEq Intravenous Stopped 04/11/21 0643)  potassium chloride SA (KLOR-CON M) CR tablet 40 mEq (40 mEq Oral Given 04/11/21 0524)  magnesium sulfate IVPB 2 g 50 mL (0 g Intravenous Stopped 04/11/21 0643)  iohexol (OMNIPAQUE) 300 MG/ML solution 100 mL (100 mLs Intravenous Contrast Given 04/11/21 0548)  alum & mag hydroxide-simeth (MAALOX/MYLANTA) 200-200-20 MG/5ML suspension 30 mL (30 mLs Oral Given 04/11/21 0728)    And  lidocaine (XYLOCAINE) 2 % viscous mouth solution 15 mL (15 mLs Oral Given 04/11/21 9211)    ED Course  I have reviewed the triage vital signs and the nursing notes.  Pertinent labs & imaging results that were available during my care of the patient were reviewed by me and considered in my medical decision making (see chart for details).    MDM Rules/Calculators/A&P                         Patient not able to offer much history from baseline neuro deficits but appears he has been vomiting for quite a few days. Ct scan showed esophagitis for which he has a history. Has been in ED for multiple hours without recurrence. Shakes his head yes when asked if he felt better after meds. Will treat for symptoms at home.    Final Clinical Impression(s) / ED Diagnoses Final diagnoses:  Esophagitis    Rx / DC Orders ED Discharge Orders          Ordered    alum &  mag hydroxide-simeth (MAALOX PLUS) 941-740-81 MG/5ML suspension  Every 6 hours PRN        04/11/21 0721    ondansetron (ZOFRAN-ODT) 4 MG disintegrating tablet        04/11/21 0721    pantoprazole (PROTONIX) 20 MG tablet  Daily        04/11/21 0721             Rosalynd Mcwright, Corene Cornea, MD 04/12/21 [phone]

## 2021-04-11 NOTE — ED Notes (Signed)
Pt given bus pass ?

## 2021-04-15 ENCOUNTER — Other Ambulatory Visit: Payer: Self-pay

## 2021-04-15 ENCOUNTER — Ambulatory Visit (INDEPENDENT_AMBULATORY_CARE_PROVIDER_SITE_OTHER): Payer: Self-pay | Admitting: Clinical

## 2021-04-15 DIAGNOSIS — F4323 Adjustment disorder with mixed anxiety and depressed mood: Secondary | ICD-10-CM

## 2021-04-16 ENCOUNTER — Telehealth: Payer: Self-pay | Admitting: Primary Care

## 2021-04-16 NOTE — Telephone Encounter (Signed)
Copied from CRM [id number]. Topic: General - Other >> Apr 14, 2021 12:34 PM Ryan Hughes wrote: Reason for CRM: Pt called to see if [facility] was setting up transportation for pts appt tomorrow /please advise asap

## 2021-04-19 ENCOUNTER — Other Ambulatory Visit: Payer: Self-pay

## 2021-04-19 ENCOUNTER — Ambulatory Visit (INDEPENDENT_AMBULATORY_CARE_PROVIDER_SITE_OTHER): Payer: Self-pay | Admitting: Primary Care

## 2021-04-19 ENCOUNTER — Encounter (INDEPENDENT_AMBULATORY_CARE_PROVIDER_SITE_OTHER): Payer: Self-pay | Admitting: Primary Care

## 2021-04-19 VITALS — BP 91/67 | HR 76 | Temp 97.5°F | Ht 67.0 in | Wt 178.2 lb

## 2021-04-19 DIAGNOSIS — E1165 Type 2 diabetes mellitus with hyperglycemia: Secondary | ICD-10-CM

## 2021-04-19 DIAGNOSIS — I952 Hypotension due to drugs: Secondary | ICD-10-CM

## 2021-04-19 DIAGNOSIS — E1159 Type 2 diabetes mellitus with other circulatory complications: Secondary | ICD-10-CM

## 2021-04-19 DIAGNOSIS — K209 Esophagitis, unspecified without bleeding: Secondary | ICD-10-CM

## 2021-04-19 DIAGNOSIS — Z09 Encounter for follow-up examination after completed treatment for conditions other than malignant neoplasm: Secondary | ICD-10-CM

## 2021-04-19 MED ORDER — INSULIN GLARGINE 100 UNIT/ML SOLOSTAR PEN
20.0000 [IU] | PEN_INJECTOR | Freq: Every day | SUBCUTANEOUS | 3 refills | Status: DC
  Filled 2021-04-19: qty 15, 75d supply, fill #0

## 2021-04-19 MED ORDER — METFORMIN HCL 1000 MG PO TABS
1000.0000 mg | ORAL_TABLET | Freq: Two times a day (BID) | ORAL | 1 refills | Status: DC
  Filled 2021-04-19: qty 180, 90d supply, fill #0

## 2021-04-19 MED ORDER — ATORVASTATIN CALCIUM 80 MG PO TABS
80.0000 mg | ORAL_TABLET | Freq: Every day | ORAL | 0 refills | Status: DC
  Filled 2021-04-19: qty 90, 90d supply, fill #0

## 2021-04-19 MED ORDER — TRULICITY 0.75 MG/0.5ML ~~LOC~~ SOAJ
0.7500 mg | SUBCUTANEOUS | 3 refills | Status: DC
  Filled 2021-04-19: qty 2, 28d supply, fill #0

## 2021-04-19 MED ORDER — LISINOPRIL 2.5 MG PO TABS
2.5000 mg | ORAL_TABLET | Freq: Every day | ORAL | 0 refills | Status: DC
  Filled 2021-04-19: qty 30, 30d supply, fill #0

## 2021-04-19 MED ORDER — POTASSIUM CHLORIDE ER 10 MEQ PO TBCR
20.0000 meq | EXTENDED_RELEASE_TABLET | Freq: Every day | ORAL | 1 refills | Status: DC
  Filled 2021-04-19: qty 30, 15d supply, fill #0

## 2021-04-19 MED ORDER — INSULIN GLARGINE 100 UNIT/ML SOLOSTAR PEN
15.0000 [IU] | PEN_INJECTOR | Freq: Two times a day (BID) | SUBCUTANEOUS | 3 refills | Status: DC
  Filled 2021-04-19: qty 15, 50d supply, fill #0

## 2021-04-19 NOTE — Progress Notes (Signed)
[facility]   Subjective:   Ryan Hughes is a 55 y.o. male presents for hospital follow up . EMS called after several episodes of n/v  without hematemesis.  Admit date to the hospital was 04/10/21, patient was discharged from the hospital on 04/11/21, patient was admitted for: Esophagitis. Today he is feeling a lot better.  Past Medical History:  Diagnosis Date   Diabetes mellitus without complication (HCC)    Hypertension    Nicotine dependence, cigarettes, uncomplicated 58/52/7782     Allergies  Allergen Reactions   Pork-Derived Products Other (See Comments)    Causes gout flares   Shellfish-Derived Products Other (See Comments)    Causes gout flares      Current Outpatient Medications on File Prior to Visit  Medication Sig Dispense Refill   alum & mag hydroxide-simeth (MAALOX PLUS) 400-400-40 MG/5ML suspension Take 15 mLs by mouth every 6 (six) hours as needed for indigestion. 355 mL 0   aspirin 325 MG tablet Take 1 tablet (325 mg total) by mouth daily. 30 tablet 0   atorvastatin (LIPITOR) 80 MG tablet Take 1 tablet (80 mg total) by mouth at bedtime. 30 tablet 0   blood glucose meter kit and supplies KIT Dispense based on patient and insurance preference. Use up to four times daily as directed. (FOR ICD-9 250.00, 250.01). 1 each 0   Blood Glucose Monitoring Suppl (TRUE METRIX METER) w/Device KIT USE UP TO 4 TIMES DAILY AS DIRECTED 1 kit 0   clopidogrel (PLAVIX) 75 MG tablet Take 1 tablet (75 mg total) by mouth daily for 21 days. 21 tablet 0   folic acid (FOLVITE) 1 MG tablet Take 1 tablet (1 mg total) by mouth daily. 30 tablet 0   glucose blood test strip USE UP TO FOUR TIMES DAILY AS DIRECTED 200 strip 0   insulin glargine (LANTUS) 100 UNIT/ML Solostar Pen Inject 20 Units into the skin daily. 15 mL 0   Insulin Pen Needle 31G X 5 MM MISC Lantus 20 units daily 100 each 0   lisinopril (ZESTRIL) 20 MG tablet Take 1 tablet (20 mg total) by mouth daily. 30 tablet 0    metFORMIN (GLUCOPHAGE) 1000 MG tablet Take 1 tablet (1,000 mg total) by mouth 2 (two) times daily with a meal. 60 tablet 0   Multiple Vitamin (MULTIVITAMIN WITH MINERALS) TABS tablet Take 1 tablet by mouth daily. 30 tablet 0   ondansetron (ZOFRAN-ODT) 4 MG disintegrating tablet 68m ODT q4 hours prn nausea/vomit 30 tablet 0   pantoprazole (PROTONIX) 20 MG tablet Take 1 tablet (20 mg total) by mouth daily. 30 tablet 0   thiamine 100 MG tablet Take 1 tablet (100 mg total) by mouth daily. 30 tablet 0   TRUE METRIX BLOOD GLUCOSE TEST test strip      TRUEplus Lancets 28G MISC USE UP TO 4 TIMES DAILY AS DIRECTED 200 each 0   No current facility-administered medications on file prior to visit.     Review of System: Review of Systems  All other systems reviewed and are negative.   Objective:  BP 91/67 (BP Location: Right Arm, Patient Position: Sitting, Cuff Size: Normal)   Pulse 76   Temp (!) 97.5 F (36.4 C) (Temporal)   Ht 5' 7"  (1.702 m)   Wt 178 lb 3.2 oz (80.8 kg)   SpO2 97%   BMI 27.91 kg/m   Filed Weights   04/19/21 1442  Weight: 178 lb 3.2 oz (80.8 kg)    Physical Exam:  General Appearance: Well nourished, in no apparent distress. Eyes: PERRLA, EOMs, conjunctiva no swelling or erythema Sinuses: No Frontal/maxillary tenderness ENT/Mouth: Ext aud canals clear, TMs without erythema, bulging. Erythema, no swelling, or exudate on post pharynx. Hearing normal.  Neck: Supple, thyroid normal.  Respiratory: Respiratory effort normal, BS equal bilaterally without rales, rhonchi, wheezing or stridor.  Cardio: RRR with no MRGs. Brisk peripheral pulses without edema.  Abdomen: Soft, + BS.  Non tender, no guarding, rebound, hernias, masses. Lymphatics: Non tender without lymphadenopathy.  Musculoskeletal: Full ROM, 5/5 strength, normal gait.  Skin: Warm, dry without rashes, lesions, ecchymosis.  Neuro: Cranial nerves intact. Normal muscle tone, no cerebellar symptoms. Sensation intact.   Psych: Awake and oriented X 3, normal affect, Insight and Judgment appropriate.    Assessment:  Ryan Hughes was seen today for hospitalization follow-up.  Diagnoses and all orders for this visit:  Hypotension due to drugs Today his Bp is 91/67 dizzy with positional changes  -     lisinopril (ZESTRIL) 2.5 MG tablet; Take 1 tablet (2.5 mg total) by mouth daily. Renal protection   Type 2 diabetes mellitus with vascular disease (HCC) A1c 11.2 uncontrolled   Discussed  co- morbidities with uncontrol diabetes  Complications -diabetic retinopathy, (close your eyes ? What do you see nothing) nephropathy decrease in kidney function- can lead to dialysis-on a machine 3 days a week to filter your kidney, neuropathy- numbness and tinging in your hands and feet,  increase risk of heart attack and stroke, and amputation due to decrease wound healing and circulation. Decrease your risk by taking medication daily as prescribed, monitor carbohydrates- foods that are high in carbohydrates are the following rice, potatoes, breads, sugars, and pastas.  Reduction in the intake (eating) will assist in lowering your blood sugars. Exercise daily at least 30 minutes daily.   Hospital discharge follow-up Recommendations from hospital visit  1. Moderate to severe Esophagitis, with associated abnormal mucosa at the GE junction. Superimposed fluid distended stomach. But no gastroduodenal inflammation. Follow-up EGD may be valuable.  Uncontrolled type 2 diabetes mellitus with hyperglycemia, with long-term current use of insulin (HCC) Monitor foods that are high in carbohydrates are the following rice, potatoes, breads, sugars, and pastas.  Reduction in the intake (eating) will assist in lowering your blood sugars.  -     insulin glargine (LANTUS) 100 UNIT/ML Solostar Pen; Inject 20 Units into the skin daily. -     metFORMIN (GLUCOPHAGE) 1000 MG tablet; Take 1 tablet (1,000 mg total) by mouth 2 (two) times daily with a  meal.  Other orders -     atorvastatin (LIPITOR) 80 MG tablet; Take 1 tablet (80 mg total) by mouth at bedtime. -     potassium chloride SA (KLOR-CON M) 10 MEQ tablet; Take 2 tablets (20 mEq total) by mouth daily.   Esophagitis 1. Moderate to severe Esophagitis, with associated abnormal mucosa at the GE junction. Superimposed fluid distended stomach. But no gastroduodenal inflammation. Follow-up EGD may be valuable. Gastroparesis is also called delayed gastric emptying it consist of weak muscular contractions (peristalsis) of the stomach resulting in food and liquid remaining in the stomach for prolonged periods of time.  Stomach contents does exist more slowly into the duodenum digestive tract.  Symptoms can include nausea vomiting abdominal pain feeling full soon after or beginning to eat, abdominal bloating and heartburn. This note has been created with Surveyor, quantity. Any transcriptional errors are unintentional.   [name], NP  04/19/2021, 3:01 PM

## 2021-04-19 NOTE — Patient Instructions (Addendum)
Gastroparesis is also called delayed gastric emptying it consist of weak muscular contractions (peristalsis) of the stomach resulting in food and liquid remaining in the stomach for prolonged periods of time.  Stomach contents does exist more slowly into the duodenum digestive tract.  Symptoms can include nausea vomiting abdominal pain feeling full soon after or beginning to eat, abdominal bloating and heartburn. Dulaglutide Injection What is this medication? DULAGLUTIDE (DOO la GLOO tide) treats type 2 diabetes. It works by increasing insulin levels in your body, which decreases your blood sugar (glucose). It also reduces the amount of sugar released into your blood and slows down your digestion. It can also be used to lower the risk of heart attack and stroke in people with type 2 diabetes. Changes to diet and exercise are often combined with this medication. This medicine may be used for other purposes; ask your health care provider or pharmacist if you have questions. COMMON BRAND NAME(S): Trulicity What should I tell my care team before I take this medication? They need to know if you have any of these conditions: Endocrine tumors (MEN 2) or if someone in your family had these tumors Eye disease, vision problems History of pancreatitis Kidney disease Liver disease Stomach or intestine problems Thyroid cancer or if someone in your family had thyroid cancer An unusual or allergic reaction to dulaglutide, other medications, foods, dyes, or preservatives Pregnant or trying to get pregnant Breast-feeding How should I use this medication? This medication is injected under the skin. You will be taught how to prepare and give it. Take it as directed on the prescription label on the same day of each week. Do NOT prime the pen. Keep taking it unless your care team tells you to stop. If you use this medication with insulin, you should inject this medication and the insulin separately. Do not mix them  together. Do not give the injections right next to each other. Change (rotate) injection sites with each injection. This medication comes with INSTRUCTIONS FOR USE. Ask your pharmacist for directions on how to use this medication. Read the information carefully. Talk to your pharmacist or care team if you have questions. It is important that you put your used needles and syringes in a special sharps container. Do not put them in a trash can. If you do not have a sharps container, call your pharmacist or care team to get one. A special MedGuide will be given to you by the pharmacist with each prescription and refill. Be sure to read this information carefully each time. Talk to your care team about the use of this medication in children. Special care may be needed. Overdosage: If you think you have taken too much of this medicine contact a poison control center or emergency room at once. NOTE: This medicine is only for you. Do not share this medicine with others. What if I miss a dose? If you miss a dose, take it as soon as you can unless it is more than 3 days late. If it is more than 3 days late, skip the missed dose. Take the next dose at the normal time. What may interact with this medication? Other medications for diabetes Many medications may cause changes in blood sugar, these include: Alcohol containing beverages Antiviral medications for HIV or AIDS Aspirin and aspirin-like medications Certain medications for blood pressure, heart disease, irregular heart beat Chromium Diuretics Male hormones, such as estrogens or progestins, birth control pills Fenofibrate Gemfibrozil Isoniazid Lanreotide Male hormones or anabolic  steroids MAOIs like Carbex, Eldepryl, Marplan, Nardil, and Parnate Medications for allergies, asthma, cold, or cough Medications for depression, anxiety, or psychotic disturbances Medications for weight loss Niacin Nicotine NSAIDs, medications for pain and  inflammation, like ibuprofen or naproxen Octreotide Pasireotide Pentamidine Phenytoin Probenecid Quinolone antibiotics such as ciprofloxacin, levofloxacin, ofloxacin Some herbal dietary supplements Steroid medications such as prednisone or cortisone Sulfamethoxazole; trimethoprim Thyroid hormones Some medications can hide the warning symptoms of low blood sugar (hypoglycemia). You may need to monitor your blood sugar more closely if you are taking one of these medications. These include: Beta-blockers, often used for high blood pressure or heart problems (examples include atenolol, metoprolol, propranolol) Clonidine Guanethidine Reserpine This list may not describe all possible interactions. Give your health care provider a list of all the medicines, herbs, non-prescription drugs, or dietary supplements you use. Also tell them if you smoke, drink alcohol, or use illegal drugs. Some items may interact with your medicine. What should I watch for while using this medication? Visit your care team for regular checks on your progress. Check with your care team if you have severe diarrhea, nausea, and vomiting, or if you sweat a lot. The loss of too much body fluid may make it dangerous for you to take this medication. A test called the HbA1C (A1C) will be monitored. This is a simple blood test. It measures your blood sugar control over the last 2 to 3 months. You will receive this test every 3 to 6 months. Learn how to check your blood sugar. Learn the symptoms of low and high blood sugar and how to manage them. Always carry a quick-source of sugar with you in case you have symptoms of low blood sugar. Examples include hard sugar candy or glucose tablets. Make sure others know that you can choke if you eat or drink when you develop serious symptoms of low blood sugar, such as seizures or unconsciousness. Get medical help at once. Tell your care team if you have high blood sugar. You might need to  change the dose of your medication. If you are sick or exercising more than usual, you may need to change the dose of your medication. Do not skip meals. Ask your care team if you should avoid alcohol. Many nonprescription cough and cold products contain sugar or alcohol. These can affect blood sugar. Pens should never be shared. Even if the needle is changed, sharing may result in passing of viruses like hepatitis or HIV. Wear a medical ID bracelet or chain. Carry a card that describes your condition. List the medications and doses you take on the card. What side effects may I notice from receiving this medication? Side effects that you should report to your care team as soon as possible: Allergic reactions--skin rash, itching, hives, swelling of the face, lips, tongue, or throat Change in vision Dehydration--increased thirst, dry mouth, feeling faint or lightheaded, headache, dark yellow or brown urine Kidney injury--decrease in the amount of urine, swelling of the ankles, hands, or feet Pancreatitis--severe stomach pain that spreads to your back or gets worse after eating or when touched, fever, nausea, vomiting Thyroid cancer--new mass or lump in the neck, pain or trouble swallowing, trouble breathing, hoarseness Side effects that usually do not require medical attention (report to your care team if they continue or are bothersome): Diarrhea Loss of appetite Nausea Stomach pain Vomiting This list may not describe all possible side effects. Call your doctor for medical advice about side effects. You may report side  effects to FDA at 1-800-FDA-1088. Where should I keep my medication? Keep out of the reach of children and pets. Refrigeration (preferred): Store unopened pens in a refrigerator between 2 and 8 degrees C (36 and 46 degrees F). Keep it in the original carton until you are ready to take it. Do not freeze or use if the medication has been frozen. Protect from light. Get rid of any  unused medication after the expiration date on the label. Room Temperature: The pen may be stored at room temperature below 30 degrees C (86 degrees F) for up to a total of 14 days if needed. Protect from light. Avoid exposure to extreme heat. If it is stored at room temperature, throw away any unused medication after 14 days or after it expires, whichever is first. To get rid of medications that are no longer needed or have expired: Take the medication to a medication take-back program. Check with your pharmacy or law enforcement to find a location. If you cannot return the medication, ask your pharmacist or care team how to get rid of this medication safely. NOTE: This sheet is a summary. It may not cover all possible information. If you have questions about this medicine, talk to your doctor, pharmacist, or health care provider.  2022 Elsevier/Gold Standard (2020-07-30 00:00:00)

## 2021-04-20 ENCOUNTER — Telehealth: Payer: Self-pay | Admitting: Primary Care

## 2021-04-20 NOTE — Telephone Encounter (Signed)
I return Pt nice call, she was inform since the Pt has apply for [facility] 04/08/21, [facility] has to wait for the respond from Medicaid, if he is denied for it then he can apply for the Financial program, medicaid take at least 3 month to send a respond, person understood

## 2021-04-20 NOTE — Telephone Encounter (Signed)
Copied from CRM [id number]. Topic: General - Other >> Apr 20, 2021  2:12 PM Jaquita Rector A wrote: Reason for CRM: Nicholes Calamity patients niece called in to ask Mikle Bosworth to please call her to schedule an appointment for patient to get his [facility] card. Can be reached at  Ph# [phone]

## 2021-04-26 ENCOUNTER — Other Ambulatory Visit: Payer: Self-pay

## 2021-04-26 NOTE — BH Specialist Note (Signed)
Integrated Behavioral Health Follow Up In-Person Visit  MRN: [id number] Name: Ryan Hughes  Number of Integrated Behavioral Health Clinician visits: 3/6 Session Start time: 2:30pm  Session End time: 3:05pm Total time: 35  minutes  Types of Service: Individual psychotherapy  Interpretor:No. Interpretor Name and Language: N/A  Subjective: Ryan Hughes is a 55 y.o. male accompanied by  Niece and Partner/Significant Other Patient was referred by Gwinda Passe, NP for depression and anxiety. Patient reports the following symptoms/concerns: Reports worrying about finances and trouble relaxing. Reports that concerns with his physical health. Reports that he has been to the hospital due to nausea and does not believe they have helped. Pt had difficulty communicating his concerns.  Pt's girlfriend expressed concerns with pt's memory and limited communication. Pt's niece reports reapplying for medicaid to assist pt.  Duration of problem: 1 year; Severity of problem: severe  Objective: Mood: Anxious and Affect:  Flat Risk of harm to self or others: No plan to harm self or others  Life Context: Family and Social: Pt receives support from girlfriend and niece. School/Work: Pt is unemployed and has appealed disability in the past but has been denied due to not working enough. Self-Care: Denies substance use. Pt has limited self-care.  Life Changes: Pt is experiencing physical health problems with hx of stroke and concerns with memory.  Patient and/or Family's Strengths/Protective Factors: Concrete supports in place (healthy food, safe environments, etc.)  Goals Addressed: Patient will:  Reduce symptoms of: agitation, anxiety, and depression   Increase knowledge and/or ability of: coping skills   Demonstrate ability to: Increase healthy adjustment to current life circumstances  Progress towards Goals: Ongoing  Interventions: Interventions utilized:  Mindfulness or Management consultant,  CBT Cognitive Behavioral Therapy, and Link to [employer] Standardized Assessments completed: Not Needed  Patient and/or Family Response: Pt receptive to psychoeducation provided on depression and anxiety. Pt exhibited limited communication due to feeling nauseous during session. Pt will utilize deep breathing exercises and go to hospital health concerns worsen.   Patient Centered Plan: Patient is on the following Treatment Plan(s): Depression and anxiety  Assessment: Denies SI/HI. Denies auditory/visual hallucinations. Patient currently experiencing depression and anxiety related to physical health. Pt has significant physical health problems and has been unable to receive needed services due to lack of insurance. Pt appears to be experiencing memory and cognitive problems. Pt exhitbited physical health problems during session related to nausea. Pt was recently in hospital and has upcoming appt with PCP.   Patient may benefit from completing CAFA application. Pt's niece has applied for medicaid for pt. LCSWA provided psychoeducation on depression. LCSWA encouraged pt to utilize deep breathing exercises. LCSWA encouraged pt to complete CAFA application with the assistance of his partner. LCSWA will fu with pt.  Plan: Follow up with behavioral health clinician on : 05/13/21 Behavioral recommendations: Complete CAFA application, utilize deep breathing exercises, and go to hospital if physical health concerns worsen Referral(s): Integrated Art gallery manager (In Clinic) and [employer]:  Finances "From scale of 1-10, how likely are you to follow plan?": 10  Ryan Sinclair C Abrar Bilton, LCSW

## 2021-05-13 ENCOUNTER — Ambulatory Visit (INDEPENDENT_AMBULATORY_CARE_PROVIDER_SITE_OTHER): Payer: Self-pay | Admitting: Clinical

## 2021-05-13 ENCOUNTER — Other Ambulatory Visit: Payer: Self-pay

## 2021-05-13 DIAGNOSIS — F4323 Adjustment disorder with mixed anxiety and depressed mood: Secondary | ICD-10-CM

## 2021-05-14 NOTE — BH Specialist Note (Signed)
Integrated Behavioral Health Follow Up In-Person Visit  MRN: [id number] Name: Ryan Hughes  Number of Integrated Behavioral Health Clinician visits: 4/6 Session Start time: 3:20pm  Session End time: 3:45pm Total time:  25  minutes  Types of Service: Individual psychotherapy  Interpretor:No. Interpretor Name and Language: N/A  Subjective: Ryan Hughes is a 56 y.o. male accompanied by Partner/Significant Other Patient was referred by Gwinda Passe, NP for depression and anxiety. Patient reports the following symptoms/concerns: Reports that his mood has improved. Reports still being frustrated about not being able to engage in the same activities he did in the past. Pt's significant other reports that his niece was working on Energy manager.  Duration of problem: 1 year; Severity of problem: moderate  Objective: Mood: Anxious and Affect: Appropriate Risk of harm to self or others: No plan to harm self or others  Life Context: Family and Social: Pt receives support from girlfriend and niece. School/Work: Pt is unemployed and has appealed disability in the past but has been denied due to not working enough. Pt's significant other has been trying to contact servant center due to them being referred in the past. Self-Care: Denies substance use. Pt has limited self-care.  Life Changes: Pt is experiencing physical health problems with hx of stroke and concerns with memory.  Patient and/or Family's Strengths/Protective Factors: Concrete supports in place (healthy food, safe environments, etc.)  Goals Addressed: Patient will:  Reduce symptoms of: agitation, anxiety, and depression   Increase knowledge and/or ability of: coping skills   Demonstrate ability to: Increase healthy adjustment to current life circumstances  Progress towards Goals: Ongoing  Interventions: Interventions utilized:  Mindfulness or Management consultant, CBT Cognitive Behavioral  Therapy, and Supportive Counseling Standardized Assessments completed: Not Needed  Patient and/or Family Response: Pt receptive to support provided as he discussed his concerns with not being able to engage in the same activities in the past. Pt receptive to psychoeducation provided on anxiety and depression. Pt receptive cognitive restructuring to decrease negative thoughts. Pt will consider identifying enjoyable activities.   Patient Centered Plan: Patient is on the following Treatment Plan(s): Depression and anxiety  Assessment: Denies SI/HI. Patient currently experiencing difficulty adjusting to current physical health problems. Pt has difficulty identifying enjoyable activities due to worrying he won't be able to complete them.   Patient may benefit from brief interventions. LCSWA encouraged pt's significant other to have pt's niece to submit the CAFA application and medicaid application if it's not submitted already. LCSWA also provided pt with financial assistance resources due to needing utility assistance. LCSWA provided psychoeducation on depression and anxiety. LCSWA utilized cognitive restructuring to decrease negative thoughts. LCSWA encouraged pt to identify enjoyable activities. LCSWA will fu with pt.  Plan: Follow up with behavioral health clinician on : 06/03/21 Behavioral recommendations: Identify enjoyable activities. Referral(s): Integrated Art gallery manager (In Clinic) and [employer]:  Finances "From scale of 1-10, how likely are you to follow plan?": 10  Missi Mcmackin C Larue Lightner, LCSW

## 2021-05-16 NOTE — Progress Notes (Unsigned)
° ° °  S:    PCP: Marcelino Duster  Patient presents for diabetes evaluation, education, and management Patient was referred and last seen by Primary Care Provider on 04/19/21 and patient was hypotensive with BP 91/67 and reported dizziness with positional changes. Lisinopril was decreased to 2.5 mg daily for renal protection in T2DM.   Today, *** -check BP, dizziness improved? -any Aes with trulicity? Looks like hasn't picked this up from [facility] -metformin to XR if needed -consolidate lantus to daily? -bmet to check K after starting supplement and decr lisin?  Family/Social History: smokes 0.5 ppd  Insurance coverage/medication affordability: self pay  Medication adherence reported *** .   Current diabetes medications include: Trulicity 0.75 mg weekly, metformin 1000 mg BID, Lantus 15 units BID Current hypertension medications include: lisinopril 2.5 mg daily Current hyperlipidemia medications include: atorvastatin 80 mg daily  Patient {Actions; denies-reports:120008} hypoglycemic events.  Patient reported dietary habits: Eats *** meals/day Breakfast:*** Lunch:*** Dinner:*** Snacks:*** Drinks:***  Patient-reported exercise habits: ***   Patient {Actions; denies-reports:120008} nocturia (nighttime urination).  Patient {Actions; denies-reports:120008} neuropathy (nerve pain). Patient {Actions; denies-reports:120008} visual changes. Patient {Actions; denies-reports:120008} self foot exams.     O:  POCT BG: ***  Lab Results  Component Value Date   HGBA1C 11.2 (H) 03/28/2021   There were no vitals filed for this visit.  Lipid Panel     Component Value Date/Time   CHOL 192 03/28/2021 0011   TRIG 140 03/28/2021 0011   HDL 38 (L) 03/28/2021 0011   CHOLHDL 5.1 03/28/2021 0011   VLDL 28 03/28/2021 0011   LDLCALC 126 (H) 03/28/2021 0011    Home fasting blood sugars: ***  2 hour post-meal/random blood sugars: ***.  Clinical Atherosclerotic Cardiovascular Disease (ASCVD): Yes   The ASCVD Risk score (Arnett DK, et al., 2019) failed to calculate for the following reasons:   The patient has a prior MI or stroke diagnosis    A/P: Diabetes longstanding currently uncontrolled with most recent A1c ***. Patient is *** able to verbalize appropriate hypoglycemia management plan. Medication adherence appears ***. Control is suboptimal due to ***. -*** -Extensively discussed pathophysiology of diabetes, recommended lifestyle interventions, dietary effects on blood sugar control -Counseled on s/sx of and management of hypoglycemia -Next A1C anticipated ***.   ASCVD risk - primary***secondary prevention in patient with diabetes. Last LDL {Is/is not:9024} controlled. ASCVD risk score {Is/is not:9024} >20%  - {Desc; low/moderate/high:110033} intensity statin indicated. Aspirin {Is/is not:9024} indicated.  -{Meds adjust:18428} aspirin *** mg  -{Meds adjust:18428} ***statin *** mg.   Hypertension longstanding*** currently ***.  Blood pressure goal = *** mmHg. Medication adherence ***.  Blood pressure control is suboptimal due to ***. -***  Written patient instructions provided.  Total time in face to face counseling *** minutes.   Follow up Pharmacist/PCP*** Clinic Visit in ***.

## 2021-05-17 ENCOUNTER — Ambulatory Visit: Payer: Self-pay | Admitting: Pharmacist

## 2021-05-18 ENCOUNTER — Telehealth: Payer: Self-pay | Admitting: *Deleted

## 2021-05-18 NOTE — Telephone Encounter (Signed)
Copied from [place] [id number]. Topic: Appointment Scheduling - Scheduling Inquiry for Clinic >> May 17, 2021 12:37 PM Pawlus, Brayton Layman A wrote: Reason for CRM: Pt wanted to reschedule his appt with Lurena Joiner, please advise.  ___________________________________ Hulen Skains patient at listed number to schedule appt with [facility]  No answer, busy signal after several rings.

## 2021-06-03 ENCOUNTER — Other Ambulatory Visit: Payer: Self-pay

## 2021-06-03 ENCOUNTER — Ambulatory Visit (INDEPENDENT_AMBULATORY_CARE_PROVIDER_SITE_OTHER): Payer: Self-pay | Admitting: Clinical

## 2021-06-03 DIAGNOSIS — Z599 Problem related to housing and economic circumstances, unspecified: Secondary | ICD-10-CM

## 2021-06-03 DIAGNOSIS — F4323 Adjustment disorder with mixed anxiety and depressed mood: Secondary | ICD-10-CM

## 2021-06-10 NOTE — BH Specialist Note (Signed)
Integrated Behavioral Health via Telemedicine Visit  06/03/2021 Ryan Hughes [id number]  Number of Integrated Behavioral Health visits: 5 Session Start time: 3:30pm  Session End time: 3:39pm Total time:  9 minutes  Referring Provider: Gwinda Passe, NP Patient/Family location: [facility] Provider location: [facility] office All persons participating in visit: Pt and LCSWA Types of Service: Telephone visit  I connected with [name] via Telephone and verified that I am speaking with the correct person using two identifiers. Discussed confidentiality: Yes   I discussed the limitations of telemedicine and the availability of in person appointments.  Discussed there is a possibility of technology failure and discussed alternative modes of communication if that failure occurs.  I discussed that engaging in this telemedicine visit, they consent to the provision of behavioral healthcare and the services will be billed under their insurance.  Patient and/or legal guardian expressed understanding and consented to Telemedicine visit: Yes   Presenting Concerns: Patient and/or family reports the following symptoms/concerns: Reports that he is still trying to engage in more activities. Reports that he has not been feeling depressed lately. Reports needing assistance with utilities. Duration of problem: 1 year; Severity of problem: moderate  Patient and/or Family's Strengths/Protective Factors: Concrete supports in place (healthy food, safe environments, etc.)  Goals Addressed: Patient will:  Reduce symptoms of: agitation, anxiety, and depression   Increase knowledge and/or ability of: coping skills   Demonstrate ability to: Increase healthy adjustment to current life circumstances  Progress towards Goals: Ongoing  Interventions: Interventions utilized:  Supportive Counseling Standardized Assessments completed: Not Needed  Patient and/or Family Response: Pt receptive to considering  drawing or coloring. Pt receptive to deep breathing.   Assessment: Denies SI/HI. Patient currently experiencing stress with physical health and finances.   Patient may benefit from identifying additional coping skills. Pt will utilize deep breathing. LCSWA provided resources to pt to assist with utilities.  Plan: Follow up with behavioral health clinician on : Pt encouraged to fu with LCSWA Behavioral recommendations: Utilize deep breathing, drawing, or coloring Referral(s): Integrated Art gallery manager (In Clinic) and [employer]:  Finances  I discussed the assessment and treatment plan with the patient and/or parent/guardian. They were provided an opportunity to ask questions and all were answered. They agreed with the plan and demonstrated an understanding of the instructions.   They were advised to call back or seek an in-person evaluation if the symptoms worsen or if the condition fails to improve as anticipated.  Nain Rudd C Ryn Peine, LCSW

## 2021-07-19 ENCOUNTER — Ambulatory Visit (INDEPENDENT_AMBULATORY_CARE_PROVIDER_SITE_OTHER): Payer: Self-pay | Admitting: Primary Care

## 2021-09-16 ENCOUNTER — Other Ambulatory Visit: Payer: Self-pay

## 2021-09-16 ENCOUNTER — Encounter (HOSPITAL_COMMUNITY): Payer: Self-pay | Admitting: Emergency Medicine

## 2021-09-16 ENCOUNTER — Emergency Department (HOSPITAL_COMMUNITY)
Admission: EM | Admit: 2021-09-16 | Discharge: 2021-09-17 | Disposition: A | Discharge status: Home / Self Care | Payer: Medicaid Other | Attending: Emergency Medicine | Admitting: Emergency Medicine

## 2021-09-16 DIAGNOSIS — R531 Weakness: Secondary | ICD-10-CM | POA: Insufficient documentation

## 2021-09-16 DIAGNOSIS — Z7982 Long term (current) use of aspirin: Secondary | ICD-10-CM | POA: Diagnosis not present

## 2021-09-16 DIAGNOSIS — T50905A Adverse effect of unspecified drugs, medicaments and biological substances, initial encounter: Secondary | ICD-10-CM

## 2021-09-16 LAB — CBC WITH DIFFERENTIAL/PLATELET
Abs Immature Granulocytes: 0.01 10*3/uL (ref 0.00–0.07)
Basophils Absolute: 0 10*3/uL (ref 0.0–0.1)
Basophils Relative: 0 %
Eosinophils Absolute: 0.1 10*3/uL (ref 0.0–0.5)
Eosinophils Relative: 1 %
HCT: 43.4 % (ref 39.0–52.0)
Hemoglobin: 14.4 g/dL (ref 13.0–17.0)
Immature Granulocytes: 0 %
Lymphocytes Relative: 43 %
Lymphs Abs: 3.3 10*3/uL (ref 0.7–4.0)
MCH: 29.8 pg (ref 26.0–34.0)
MCHC: 33.2 g/dL (ref 30.0–36.0)
MCV: 89.7 fL (ref 80.0–100.0)
Monocytes Absolute: 0.5 10*3/uL (ref 0.1–1.0)
Monocytes Relative: 6 %
Neutro Abs: 3.7 10*3/uL (ref 1.7–7.7)
Neutrophils Relative %: 50 %
Platelets: 300 10*3/uL (ref 150–400)
RBC: 4.84 MIL/uL (ref 4.22–5.81)
RDW: 13.1 % (ref 11.5–15.5)
WBC: 7.6 10*3/uL (ref 4.0–10.5)
nRBC: 0 % (ref 0.0–0.2)

## 2021-09-16 LAB — BASIC METABOLIC PANEL
Anion gap: 7 (ref 5–15)
BUN: 17 mg/dL (ref 6–20)
CO2: 27 mmol/L (ref 22–32)
Calcium: 9.5 mg/dL (ref 8.9–10.3)
Chloride: 106 mmol/L (ref 98–111)
Creatinine, Ser: 1.39 mg/dL — ABNORMAL HIGH (ref 0.61–1.24)
GFR, Estimated: 59 mL/min — ABNORMAL LOW (ref >60)
Glucose, Bld: 247 mg/dL — ABNORMAL HIGH (ref 70–99)
Potassium: 4 mmol/L (ref 3.5–5.1)
Sodium: 140 mmol/L (ref 135–145)

## 2021-09-16 LAB — PROTIME-INR
INR: 0.9 (ref 0.8–1.2)
Prothrombin Time: 12.4 seconds (ref 11.4–15.2)

## 2021-09-16 NOTE — ED Triage Notes (Addendum)
Presents with EMS for "acting off" and shuffled gait, quiet speech after taking marijuana today. ? ?Baseline L side def but A&Ox4. ? ?[facility] 9am. NIH 0 in triage ? ?EMS: 112/68, 72 bp, 18 RR, 100% RA, 236cbg; received 500cc NS, NSR with EMS, 20G L AC, stroke screen neg with EMS ? ?H/o CVA ?

## 2021-09-16 NOTE — ED Provider Triage Note (Signed)
Emergency Medicine Provider Triage Evaluation Note ? ?Ryan Hughes , a 56 y.o. male  was evaluated in triage.  Pt complains of not feeling well today after smoking marijuana.  He does have history of CVA.  EMS mentions shuffling gait, and speaking in a low voice.  He does have left-sided deficits but these appear to be residual from previous stroke.  ? ?Review of Systems  ?Positive: As above ?Negative: As above ? ?Physical Exam  ?BP 106/68 (BP Location: Right Arm)   Pulse 76   Temp 98 ?F (36.7 ?C) (Oral)   Resp 18   Wt 80 kg   SpO2 99%   BMI 27.62 kg/m?  ?Gen:   Awake, no distress   ?Resp:  Normal effort  ?MSK:   Moves extremities without difficulty  ?Other:  Slight facial droop on the left.  5/5 strength in bilateral upper and lower extremities and extensor and flexor muscle groups.  Sensation intact bilaterally. ? ?Medical Decision Making  ?Medically screening exam initiated at 9:57 PM.  Appropriate orders placed.  Ryan Hughes was informed that the remainder of the evaluation will be completed by another provider, this initial triage assessment does not replace that evaluation, and the importance of remaining in the ED until their evaluation is complete. ? ? ?  ?Evlyn Courier, PA-C ?09/16/21 2158 ? ?

## 2021-09-17 ENCOUNTER — Emergency Department (HOSPITAL_COMMUNITY): Payer: Medicaid Other

## 2021-09-17 IMAGING — CT CT HEAD W/O CM
4 of 5 series · 15 of 47 positions shown, 17 images · non-contrast
Comparison: [DATE]

CLINICAL DATA: Dizziness



[Series 3: head without · axial · non-contrast · 0.47mm/px · z∈[-78,+32]mm · 5 of 34 slices shown, 7 images]
[im 6/34  brain]
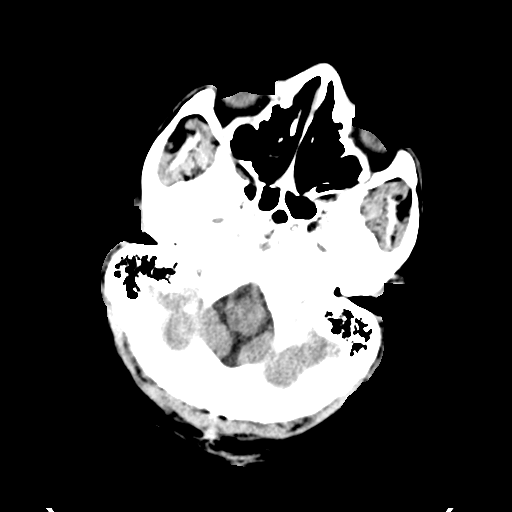
[im 6/34  bone]
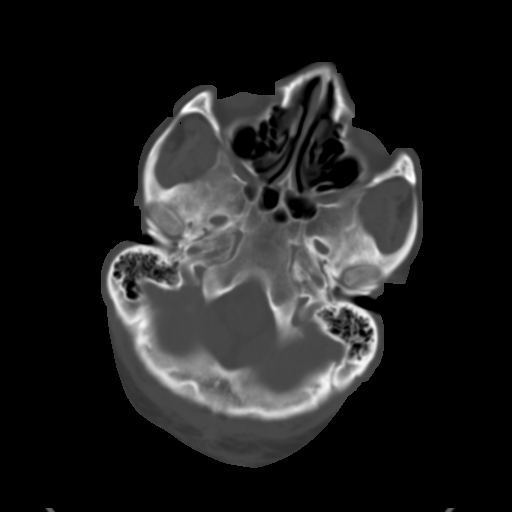
[im 12/34  brain]
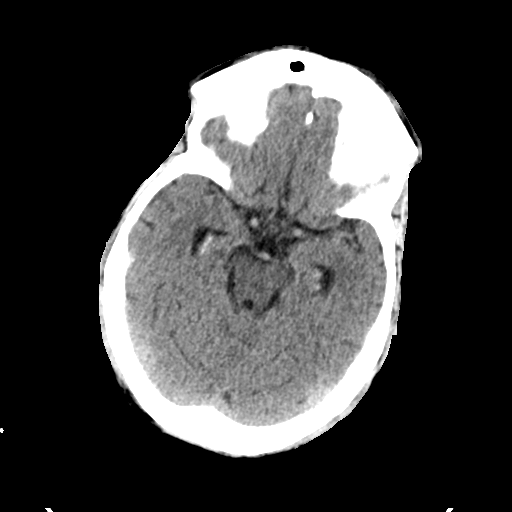
[im 17/34  brain]
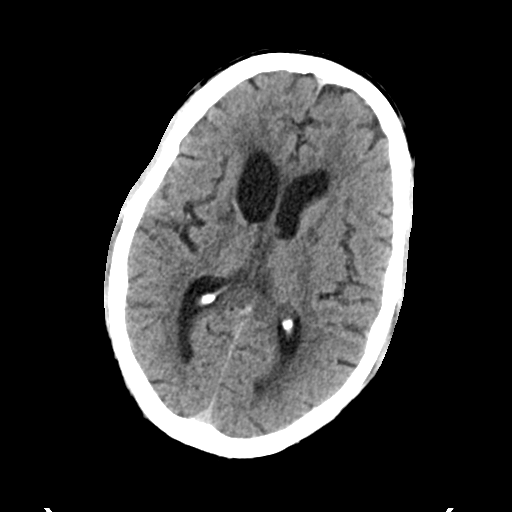
[im 23/34  brain]
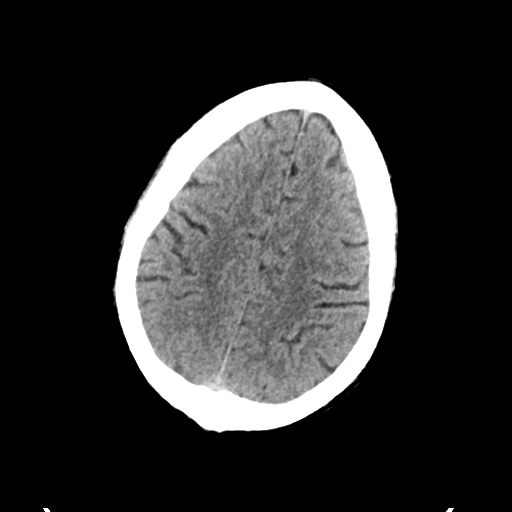
[im 28/34  brain]
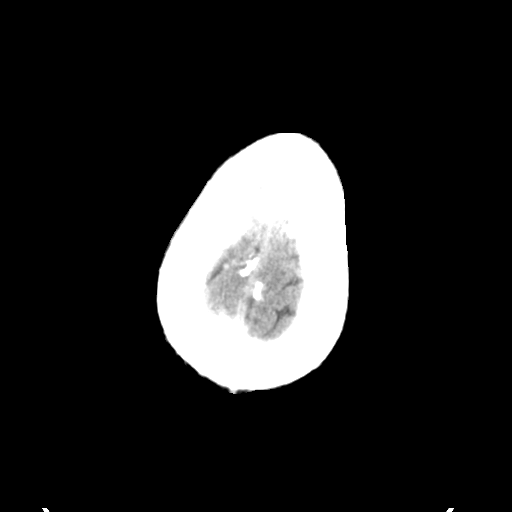
[im 28/34  bone]
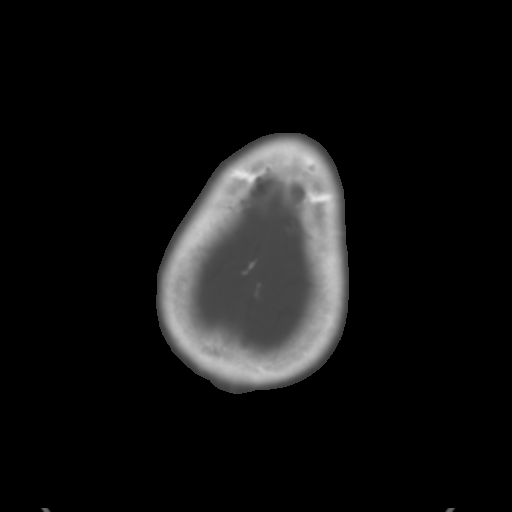

[Series 5: head without cor · coronal · non-contrast · 0.33mm/px · 3 of 74 slices shown]
[im 25/74  brain]
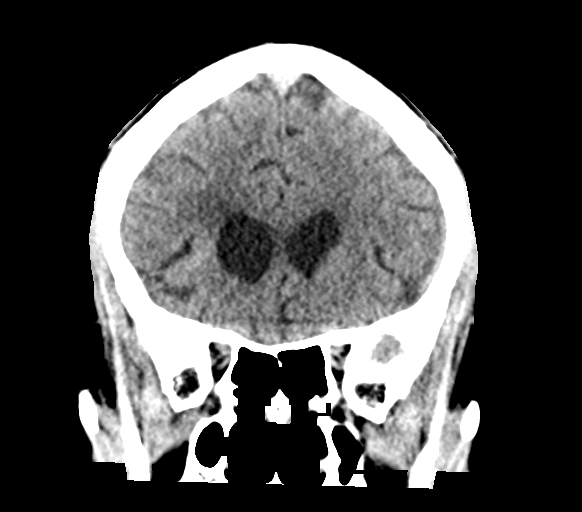
[im 33/74  brain]
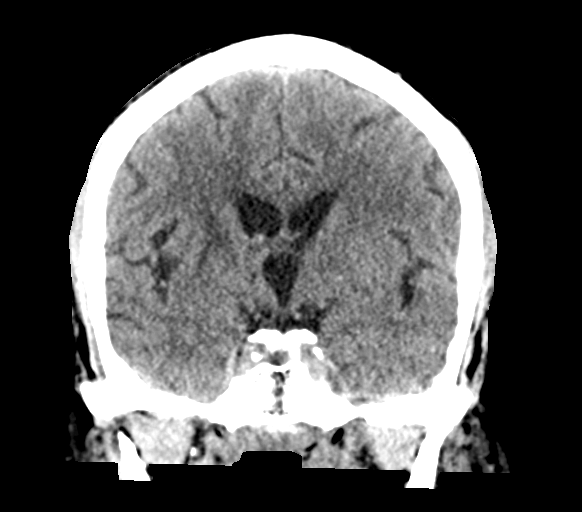
[im 41/74  brain]
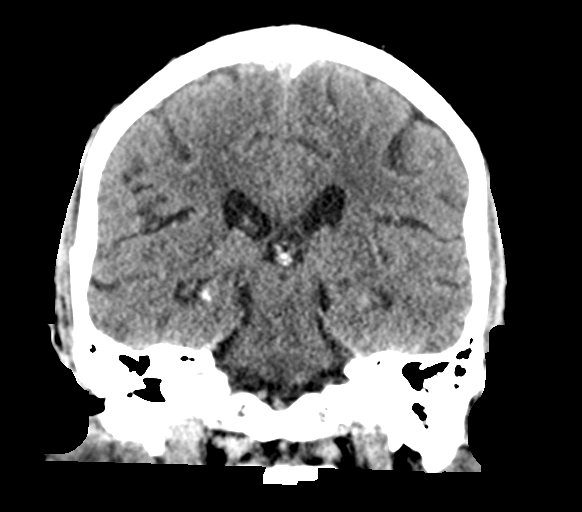

[Series 6: head without sag · sagittal · non-contrast · 0.35mm/px · 3 of 67 slices shown]
[im 23/67  brain]
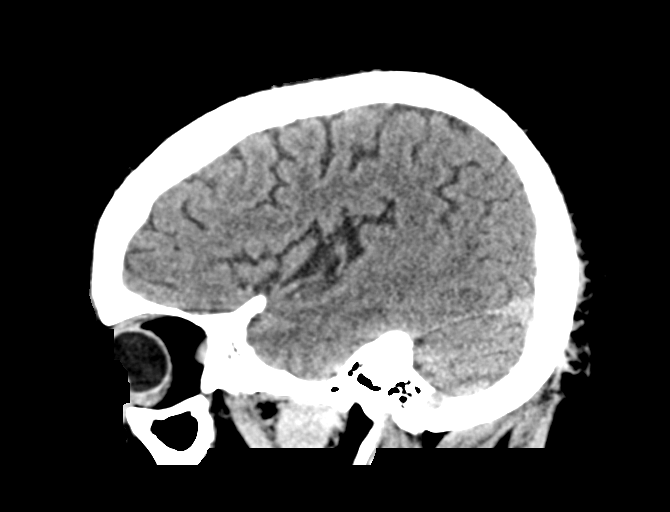
[im 34/67  brain]
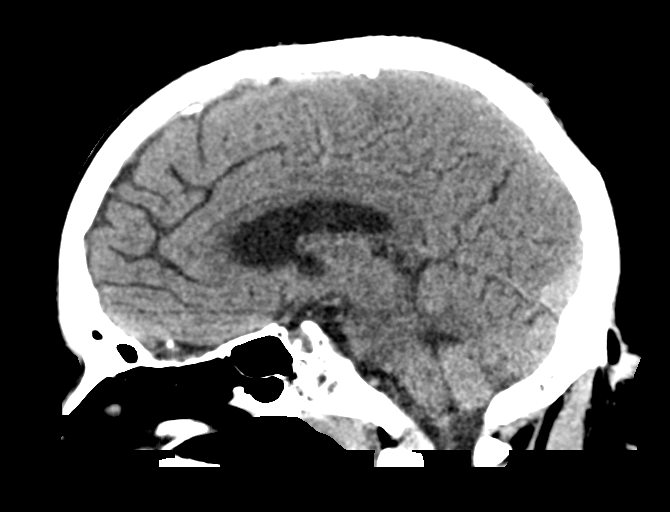
[im 45/67  brain]
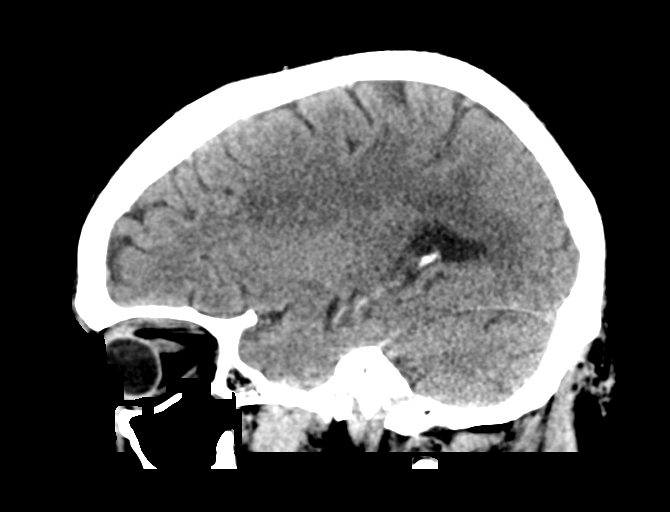

[Series 7: head without ax · axial · non-contrast · 0.34mm/px · z∈[-88,-3]mm · 4 of 35 slices shown]
[im 6/35  brain]
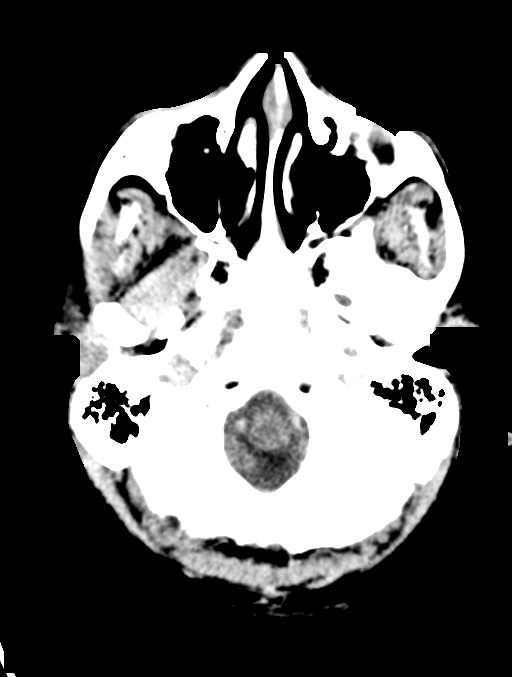
[im 12/35  brain]
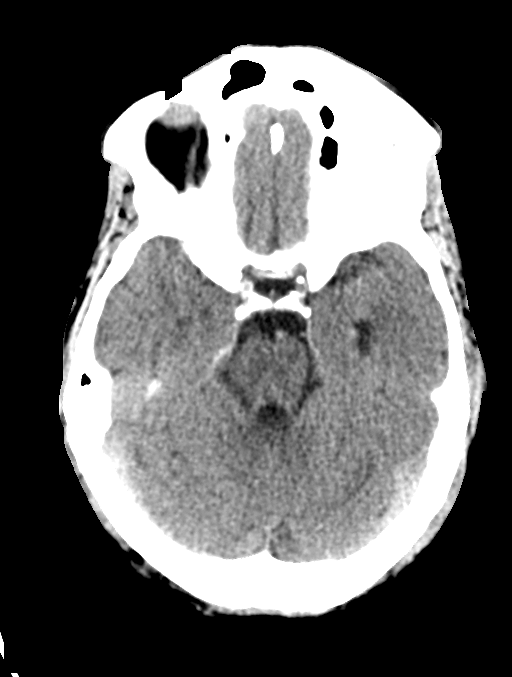
[im 18/35  brain]
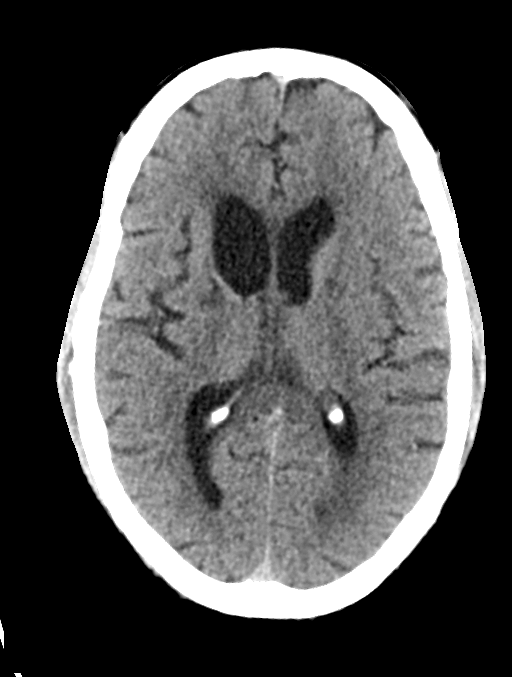
[im 23/35  brain]
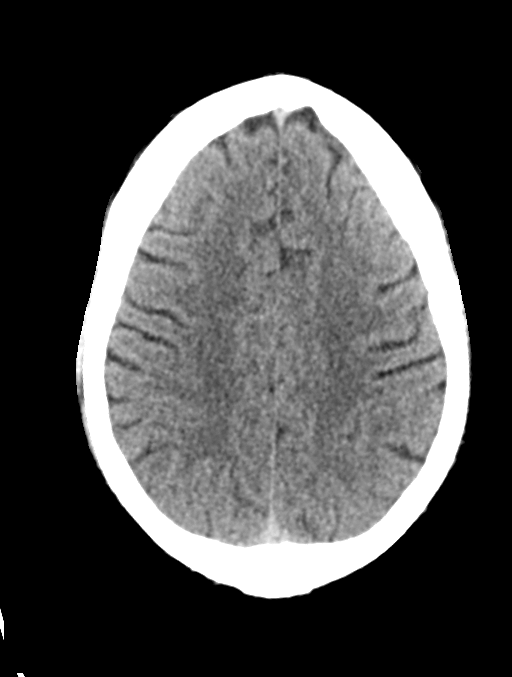

[15 of 47 positions shown; findings below may reference images not displayed]

FINDINGS: Brain: There is atrophy and chronic small vessel disease changes. No
acute intracranial abnormality. Specifically, no hemorrhage,
hydrocephalus, mass lesion, acute infarction, or significant
intracranial injury.

Vascular: No hyperdense vessel or unexpected calcification.

Skull: No acute calvarial abnormality.

Sinuses/Orbits: No acute findings

Other: None
IMPRESSION: Atrophy, chronic microvascular disease.

No acute intracranial abnormality.

## 2021-09-17 NOTE — ED Provider Notes (Signed)
?[place] ?Provider Note ? ? ?CSN: [id number] ?Arrival date & time: 09/16/21  2147 ? ?  ? ?History ? ?Chief Complaint  ?Patient presents with  ? Weakness  ? ? ?Ryan Hughes is a 56 y.o. male. ? ?The history is provided by the patient.  ?Illness ?Location:  Whole body ?Quality:  Felt funny and globally weak after smoking a marijuan cigar ?Severity:  Moderate ?Onset quality:  Sudden ?Duration: hour. ?Timing:  Constant ?Progression:  Partially resolved ?Chronicity:  New ?Context:  Smoked marijuana cigar ?Relieved by:  Nothing ?Worsened by:  Nothing ?Ineffective treatments:  None ?Associated symptoms: no abdominal pain, no chest pain, no congestion, no cough, no diarrhea, no ear pain, no fatigue, no fever, no headaches, no loss of consciousness, no myalgias, no nausea, no rash, no rhinorrhea, no shortness of breath, no sore throat, no vomiting and no wheezing   ? ?  ? ?Home Medications ?Prior to Admission medications   ?Medication Sig Start Date End Date Taking? Authorizing Provider  ?alum & mag hydroxide-simeth (MAALOX PLUS) 400-400-40 MG/5ML suspension Take 15 mLs by mouth every 6 (six) hours as needed for indigestion. 04/11/21   Mesner, Corene Cornea, MD  ?aspirin 325 MG tablet Take 1 tablet (325 mg total) by mouth daily. 03/29/21   Aline August, MD  ?atorvastatin (LIPITOR) 80 MG tablet Take 1 tablet (80 mg total) by mouth at bedtime. 04/19/21   [name], NP  ?blood glucose meter kit and supplies KIT Dispense based on patient and insurance preference. Use up to four times daily as directed. (FOR ICD-9 250.00, 250.01). 04/27/20   Dwyane Dee, MD  ?Dulaglutide (TRULICITY) 6.50 PT/4.6FK SOPN Inject 0.75 mg into the skin once a week. 04/19/21   [name], NP  ?folic acid (FOLVITE) 1 MG tablet Take 1 tablet (1 mg total) by mouth daily. 03/29/21   Aline August, MD  ?insulin glargine (LANTUS) 100 UNIT/ML Solostar Pen Inject 15 Units into the skin 2 (two) times  daily. 04/19/21   [name], NP  ?Insulin Pen Needle 31G X 5 MM MISC Lantus 20 units daily 03/28/21   Aline August, MD  ?lisinopril (ZESTRIL) 2.5 MG tablet Take 1 tablet (2.5 mg total) by mouth daily. 04/19/21   [name], NP  ?metFORMIN (GLUCOPHAGE) 1000 MG tablet Take 1 tablet (1,000 mg total) by mouth 2 (two) times daily with a meal. 04/19/21 05/19/21  [name], NP  ?Multiple Vitamin (MULTIVITAMIN WITH MINERALS) TABS tablet Take 1 tablet by mouth daily. 03/29/21   Aline August, MD  ?ondansetron (ZOFRAN-ODT) 4 MG disintegrating tablet 77m ODT q4 hours prn nausea/vomit 04/11/21   Mesner, JCorene Cornea MD  ?pantoprazole (PROTONIX) 20 MG tablet Take 1 tablet (20 mg total) by mouth daily. 04/11/21 05/11/21  Mesner, JCorene Cornea MD  ?potassium chloride (KLOR-CON) 10 MEQ tablet Take 2 tablets (20 mEq total) by mouth daily. 04/19/21   EKerin Perna NP  ?thiamine 100 MG tablet Take 1 tablet (100 mg total) by mouth daily. 03/29/21   AAline August MD  ?TRUE METRIX BLOOD GLUCOSE TEST test strip  04/27/20   [provider]  ?   ? ?Allergies    ?Pork-derived products and Shellfish-derived products   ? ?Review of Systems   ?Review of Systems  ?Constitutional:  Negative for fatigue and fever.  ?HENT:  Negative for congestion, ear pain, rhinorrhea and sore throat.   ?Eyes:  Negative for redness.  ?Respiratory:  Negative for cough, shortness of breath and  wheezing.   ?Cardiovascular:  Negative for chest pain.  ?Gastrointestinal:  Negative for abdominal pain, diarrhea, nausea and vomiting.  ?Genitourinary:  Negative for difficulty urinating.  ?Musculoskeletal:  Negative for myalgias.  ?Skin:  Negative for rash.  ?Neurological:  Negative for loss of consciousness and headaches.  ?Psychiatric/Behavioral:  Negative for agitation.   ?All other systems reviewed and are negative. ? ?Physical Exam ?Updated Vital Signs ?BP 110/69 (BP Location: Right Arm)   Pulse 66   Temp 98 ?F (36.7 ?C) (Oral)   Resp  18   Wt 80 kg   SpO2 100%   BMI 27.62 kg/m?  ?Physical Exam ?Vitals and nursing note reviewed.  ?Constitutional:   ?   General: He is not in acute distress. ?   Appearance: Normal appearance.  ?HENT:  ?   Head: Normocephalic and atraumatic.  ?   Nose: Nose normal.  ?   Mouth/Throat:  ?   Mouth: Mucous membranes are moist.  ?Eyes:  ?   Extraocular Movements: Extraocular movements intact.  ?   Conjunctiva/sclera: Conjunctivae normal.  ?   Pupils: Pupils are equal, round, and reactive to light.  ?Cardiovascular:  ?   Rate and Rhythm: Normal rate and regular rhythm.  ?   Pulses: Normal pulses.  ?   Heart sounds: Normal heart sounds.  ?Pulmonary:  ?   Effort: Pulmonary effort is normal.  ?   Breath sounds: Normal breath sounds.  ?Abdominal:  ?   General: Abdomen is flat. Bowel sounds are normal.  ?   Palpations: Abdomen is soft.  ?   Tenderness: There is no abdominal tenderness. There is no guarding.  ?Musculoskeletal:     ?   General: Normal range of motion.  ?   Cervical back: Normal range of motion and neck supple.  ?Skin: ?   General: Skin is warm and dry.  ?   Capillary Refill: Capillary refill takes less than 2 seconds.  ?Neurological:  ?   General: No focal deficit present.  ?   Mental Status: He is alert.  ?   Cranial Nerves: No cranial nerve deficit.  ?   Sensory: No sensory deficit.  ?   Deep Tendon Reflexes: Reflexes normal.  ?   Comments: Residual deficits unchanged   ?Psychiatric:     ?   Mood and Affect: Mood normal.     ?   Behavior: Behavior normal.  ? ? ?ED Results / Procedures / Treatments   ?Labs ?(all labs ordered are listed, but only abnormal results are displayed) ?Results for orders placed or performed during the hospital encounter of 09/16/21  ?CBC with Differential  ?Result Value Ref Range  ? WBC 7.6 4.0 - 10.5 K/uL  ? RBC 4.84 4.22 - 5.81 MIL/uL  ? Hemoglobin 14.4 13.0 - 17.0 g/dL  ? HCT 43.4 39.0 - 52.0 %  ? MCV 89.7 80.0 - 100.0 fL  ? MCH 29.8 26.0 - 34.0 pg  ? MCHC 33.2 30.0 - 36.0 g/dL  ?  RDW 13.1 11.5 - 15.5 %  ? Platelets 300 150 - 400 K/uL  ? nRBC 0.0 0.0 - 0.2 %  ? Neutrophils Relative % 50 %  ? Neutro Abs 3.7 1.7 - 7.7 K/uL  ? Lymphocytes Relative 43 %  ? Lymphs Abs 3.3 0.7 - 4.0 K/uL  ? Monocytes Relative 6 %  ? Monocytes Absolute 0.5 0.1 - 1.0 K/uL  ? Eosinophils Relative 1 %  ? Eosinophils Absolute 0.1 0.0 - 0.5 K/uL  ?  Basophils Relative 0 %  ? Basophils Absolute 0.0 0.0 - 0.1 K/uL  ? Immature Granulocytes 0 %  ? Abs Immature Granulocytes 0.01 0.00 - 0.07 K/uL  ?Basic metabolic panel  ?Result Value Ref Range  ? Sodium 140 135 - 145 mmol/L  ? Potassium 4.0 3.5 - 5.1 mmol/L  ? Chloride 106 98 - 111 mmol/L  ? CO2 27 22 - 32 mmol/L  ? Glucose, Bld 247 (H) 70 - 99 mg/dL  ? BUN 17 6 - 20 mg/dL  ? Creatinine, Ser 1.39 (H) 0.61 - 1.24 mg/dL  ? Calcium 9.5 8.9 - 10.3 mg/dL  ? GFR, Estimated 59 (L) >60 mL/min  ? Anion gap 7 5 - 15  ?Protime-INR  ?Result Value Ref Range  ? Prothrombin Time 12.4 11.4 - 15.2 seconds  ? INR 0.9 0.8 - 1.2  ? ?CT Head Wo Contrast ? ?Result Date: 09/17/2021 ?CLINICAL DATA:  Dizziness EXAM: CT HEAD WITHOUT CONTRAST TECHNIQUE: Contiguous axial images were obtained from the base of the skull through the vertex without intravenous contrast. RADIATION DOSE REDUCTION: This exam was performed according to the departmental dose-optimization program which includes automated exposure control, adjustment of the mA and/or kV according to patient size and/or use of iterative reconstruction technique. COMPARISON:  03/27/2021 FINDINGS: Brain: There is atrophy and chronic small vessel disease changes. No acute intracranial abnormality. Specifically, no hemorrhage, hydrocephalus, mass lesion, acute infarction, or significant intracranial injury. Vascular: No hyperdense vessel or unexpected calcification. Skull: No acute calvarial abnormality. Sinuses/Orbits: No acute findings Other: None IMPRESSION: Atrophy, chronic microvascular disease. No acute intracranial abnormality. Electronically  Signed   By: Rolm Baptise M.D.   On: 09/17/2021 00:17   ? ? ?EKG ?None ? ?Radiology ?CT Head Wo Contrast ? ?Result Date: 09/17/2021 ?CLINICAL DATA:  Dizziness EXAM: CT HEAD WITHOUT CONTRAST TECHNIQUE: Contiguous ax

## 2021-11-30 ENCOUNTER — Ambulatory Visit (INDEPENDENT_AMBULATORY_CARE_PROVIDER_SITE_OTHER): Payer: Medicaid Other | Admitting: Primary Care

## 2021-12-08 ENCOUNTER — Ambulatory Visit (INDEPENDENT_AMBULATORY_CARE_PROVIDER_SITE_OTHER): Payer: Medicaid Other | Admitting: Primary Care

## 2021-12-08 ENCOUNTER — Encounter (INDEPENDENT_AMBULATORY_CARE_PROVIDER_SITE_OTHER): Payer: Self-pay | Admitting: Primary Care

## 2021-12-08 VITALS — BP 150/90 | HR 98 | Temp 98.6°F | Ht 67.0 in | Wt 179.0 lb

## 2021-12-08 DIAGNOSIS — I10 Essential (primary) hypertension: Secondary | ICD-10-CM

## 2021-12-08 DIAGNOSIS — E1165 Type 2 diabetes mellitus with hyperglycemia: Secondary | ICD-10-CM | POA: Diagnosis not present

## 2021-12-08 DIAGNOSIS — Z760 Encounter for issue of repeat prescription: Secondary | ICD-10-CM

## 2021-12-08 DIAGNOSIS — I639 Cerebral infarction, unspecified: Secondary | ICD-10-CM | POA: Diagnosis not present

## 2021-12-08 LAB — POCT GLYCOSYLATED HEMOGLOBIN (HGB A1C): Hemoglobin A1C: 13.9 % — AB (ref 4.0–5.6)

## 2021-12-08 MED ORDER — INSULIN GLARGINE 100 UNIT/ML SOLOSTAR PEN: 15.0000 [IU] | PEN_INJECTOR | Freq: Two times a day (BID) | SUBCUTANEOUS | 3 refills | Status: DC

## 2021-12-08 MED ORDER — LOSARTAN POTASSIUM-HCTZ 100-25 MG PO TABS: 1.0000 | ORAL_TABLET | Freq: Every day | ORAL | 1 refills | Status: DC

## 2021-12-08 MED ORDER — TRULICITY 0.75 MG/0.5ML ~~LOC~~ SOAJ
0.7500 mg | SUBCUTANEOUS | 3 refills | Status: DC
Start: 2021-12-08 — End: 2024-02-06

## 2021-12-08 NOTE — Progress Notes (Signed)
Subjective:  Patient ID: Ryan Hughes, male    DOB: [birth year]  Age: 56 y.o. MRN: [id number]  CC: Diabetes (Needs referral to podiatry )   HPI Ryan Hughes presents for Follow-up of diabetes. Patient does not check blood sugar at home. The management of HTN- Patient has No headache, No chest pain, No abdominal pain - No Nausea, No new weakness tingling or numbness, No Cough - shortness of breath ( Niece Amy reports drinks a liter of sprite a day) Patient also, gave permission to be present at appt.   Compliant with meds - No Checking CBGs? No  Fasting avg -   Postprandial average -  Exercising regularly? - No Watching carbohydrate intake? - No Neuropathy ? - No Hypoglycemic events - No  - Recovers with :   Pertinent ROS:  Polyuria - Yes Polydipsia - Yes Vision problems - Yes  Medications as noted below. Taking them regularly without complication/adverse reaction being reported today.   History Ryan Hughes has a past medical history of Diabetes mellitus without complication ([place]), Hypertension, and Nicotine dependence, cigarettes, uncomplicated (57/97/2820).   He has no past surgical history on file.   His family history includes Stroke in his mother.He reports that he has been smoking cigarettes. He has been smoking an average of .5 packs per day. He has never used smokeless tobacco. He reports current alcohol use. He reports current drug use. Drug: Marijuana.  Current Outpatient Medications on File Prior to Visit  Medication Sig Dispense Refill   aspirin 325 MG tablet Take 1 tablet (325 mg total) by mouth daily. (Patient not taking: Reported on 12/08/2021) 30 tablet 0   atorvastatin (LIPITOR) 80 MG tablet Take 1 tablet (80 mg total) by mouth at bedtime. (Patient not taking: Reported on 12/08/2021) 90 tablet 0   blood glucose meter kit and supplies KIT Dispense based on patient and insurance preference. Use up to four times daily as directed. (FOR ICD-9 250.00, 250.01). (Patient not  taking: Reported on 12/08/2021) 1 each 0   Dulaglutide (TRULICITY) 6.01 VI/1.5PP SOPN Inject 0.75 mg into the skin once a week. (Patient not taking: Reported on 12/08/2021) 2 mL 3   Insulin Pen Needle 31G X 5 MM MISC Lantus 20 units daily (Patient not taking: Reported on 12/08/2021) 100 each 0   metFORMIN (GLUCOPHAGE) 1000 MG tablet Take 1 tablet (1,000 mg total) by mouth 2 (two) times daily with a meal. 180 tablet 1   Multiple Vitamin (MULTIVITAMIN WITH MINERALS) TABS tablet Take 1 tablet by mouth daily. (Patient not taking: Reported on 12/08/2021) 30 tablet 0   ondansetron (ZOFRAN-ODT) 4 MG disintegrating tablet 45m ODT q4 hours prn nausea/vomit (Patient not taking: Reported on 12/08/2021) 30 tablet 0   pantoprazole (PROTONIX) 20 MG tablet Take 1 tablet (20 mg total) by mouth daily. 30 tablet 0   potassium chloride (KLOR-CON) 10 MEQ tablet Take 2 tablets (20 mEq total) by mouth daily. (Patient not taking: Reported on 12/08/2021) 30 tablet 1   thiamine 100 MG tablet Take 1 tablet (100 mg total) by mouth daily. (Patient not taking: Reported on 12/08/2021) 30 tablet 0   TRUE METRIX BLOOD GLUCOSE TEST test strip  (Patient not taking: Reported on 12/08/2021)     No current facility-administered medications on file prior to visit.    ROS Comprehensive ROS Pertinent positive and negative noted in HPI    Objective:  BP (!) 150/90   Pulse 98   Temp 98.6 F (37 C) (Oral)   Ht 5'  7" (1.702 m)   Wt 179 lb (81.2 kg)   SpO2 97%   BMI 28.04 kg/m   BP Readings from Last 3 Encounters:  12/08/21 (!) 150/90  09/17/21 (!) 157/96  04/19/21 91/67    Wt Readings from Last 3 Encounters:  12/08/21 179 lb (81.2 kg)  09/16/21 176 lb 5.9 oz (80 kg)  04/19/21 178 lb 3.2 oz (80.8 kg)    Physical Exam Vitals reviewed.  HENT:     Head: Normocephalic.     Right Ear: Tympanic membrane and external ear normal.     Left Ear: Tympanic membrane and external ear normal.     Nose: Nose normal.  Eyes:     Extraocular  Movements: Extraocular movements intact.     Pupils: Pupils are equal, round, and reactive to light.  Cardiovascular:     Rate and Rhythm: Normal rate and regular rhythm.  Pulmonary:     Effort: Pulmonary effort is normal.     Breath sounds: Normal breath sounds.  Abdominal:     General: Bowel sounds are normal. There is distension.     Palpations: Abdomen is soft.  Musculoskeletal:        General: Normal range of motion.     Cervical back: Normal range of motion and neck supple.     Comments: Unstable gait uses cane stability   Skin:    General: Skin is warm and dry.  Neurological:     Mental Status: He is alert. He is disoriented.     Sensory: Sensory deficit present.     Gait: Gait abnormal.  Psychiatric:        Mood and Affect: Mood normal.   Lab Results  Component Value Date   HGBA1C 13.9 (A) 12/08/2021   HGBA1C 11.2 (H) 03/28/2021   HGBA1C 11.9 (A) 12/31/2020       Assessment & Plan:   Ryan Hughes was seen today for diabetes.  Diagnoses and all orders for this visit:  Uncontrolled type 2 diabetes mellitus with hyperglycemia, with long-term current use of insulin (HCC) -     HgB A1c 13.9 Discussed  co- morbidities with uncontrol diabetes  Complications -diabetic retinopathy, (close your eyes ? What do you see nothing) nephropathy decrease in kidney function- can lead to dialysis-on a machine 3 days a week to filter your kidney, neuropathy- numbness and tinging in your hands and feet,  increase risk of heart attack and stroke, and amputation due to decrease wound healing and circulation. Decrease your risk by taking medication daily as prescribed, monitor carbohydrates- foods that are high in carbohydrates are the following rice, potatoes, breads, sugars, and pastas.  Reduction in the intake (eating) will assist in lowering your blood sugars. Exercise daily at least 30 minutes daily.  -     Ambulatory referral to Podiatry -     insulin glargine (LANTUS) 100 UNIT/ML Solostar  Pen; Inject 15 Units into the skin 2 (two) times daily. -     losartan-hydrochlorothiazide (HYZAAR) 100-25 MG tablet; Take 1 tablet by mouth daily. -     Comprehensive metabolic panel -     Lipid panel -     CBC with Differential/Platelet Referred for podiatry for cutting his toenails  Essential hypertension Counseled on blood pressure goal of less than 130/80, low-sodium, DASH diet, medication compliance, 150 minutes of moderate intensity exercise per week. Discussed medication compliance, adverse effects.  -     losartan-hydrochlorothiazide (HYZAAR) 100-25 MG tablet; Take 1 tablet by mouth daily. -  Comprehensive metabolic panel  Cerebrovascular accident (CVA), unspecified mechanism ([place]) -     Lipid panel  Medication refill -     insulin glargine (LANTUS) 100 UNIT/ML Solostar Pen; Inject 15 Units into the skin 2 (two) times daily.            Dulaglutide (TRULICITY) 8.97 VN/5.0CH SOPN; Inject 0.75 mg into the skin once a week.      I have discontinued Ryan Hughes's folic acid, alum & mag hydroxide-simeth, and lisinopril. I am also having him start on losartan-hydrochlorothiazide. Additionally, I am having him maintain his blood glucose meter kit and supplies, True Metrix Blood Glucose Test, aspirin, Insulin Pen Needle, multivitamin with minerals, thiamine, ondansetron, pantoprazole, metFORMIN, atorvastatin, potassium chloride, Trulicity, and insulin glargine.  Meds ordered this encounter  Medications   insulin glargine (LANTUS) 100 UNIT/ML Solostar Pen    Sig: Inject 15 Units into the skin 2 (two) times daily.    Dispense:  15 mL    Refill:  3    Order Specific Question:   Supervising Provider    Answer:   Tresa Garter [[id number]]   losartan-hydrochlorothiazide (HYZAAR) 100-25 MG tablet    Sig: Take 1 tablet by mouth daily.    Dispense:  90 tablet    Refill:  1    Order Specific Question:   Supervising Provider    Answer:   Tresa Garter [id number]      Follow-up:   Return in about 4 weeks (around 01/05/2022) for physical decline /bp.  The above assessment and management plan was discussed with the patient. The patient verbalized understanding of and has agreed to the management plan. Patient is aware to call the clinic if symptoms fail to improve or worsen. Patient is aware when to return to the clinic for a follow-up visit. Patient educated on when it is appropriate to go to the emergency department.   Juluis Mire, NP-C

## 2021-12-09 LAB — CBC WITH DIFFERENTIAL/PLATELET
Basophils Absolute: 0 10*3/uL (ref 0.0–0.2)
Basos: 1 %
EOS (ABSOLUTE): 0 10*3/uL (ref 0.0–0.4)
Eos: 1 %
Hematocrit: 46.8 % (ref 37.5–51.0)
Hemoglobin: 15.3 g/dL (ref 13.0–17.7)
Immature Grans (Abs): 0 10*3/uL (ref 0.0–0.1)
Immature Granulocytes: 0 %
Lymphocytes Absolute: 1.9 10*3/uL (ref 0.7–3.1)
Lymphs: 38 %
MCH: 29.6 pg (ref 26.6–33.0)
MCHC: 32.7 g/dL (ref 31.5–35.7)
MCV: 91 fL (ref 79–97)
Monocytes Absolute: 0.4 10*3/uL (ref 0.1–0.9)
Monocytes: 8 %
Neutrophils Absolute: 2.6 10*3/uL (ref 1.4–7.0)
Neutrophils: 52 %
Platelets: 307 10*3/uL (ref 150–450)
RBC: 5.17 x10E6/uL (ref 4.14–5.80)
RDW: 13.4 % (ref 11.6–15.4)
WBC: 4.9 10*3/uL (ref 3.4–10.8)

## 2021-12-09 LAB — COMPREHENSIVE METABOLIC PANEL
ALT: 10 IU/L (ref 0–44)
AST: 8 IU/L (ref 0–40)
Albumin/Globulin Ratio: 2.2 (ref 1.2–2.2)
Albumin: 5 g/dL — ABNORMAL HIGH (ref 3.8–4.9)
Alkaline Phosphatase: 130 IU/L — ABNORMAL HIGH (ref 44–121)
BUN/Creatinine Ratio: 14 (ref 9–20)
BUN: 14 mg/dL (ref 6–24)
Bilirubin Total: 0.4 mg/dL (ref 0.0–1.2)
CO2: 22 mmol/L (ref 20–29)
Calcium: 10.1 mg/dL (ref 8.7–10.2)
Chloride: 97 mmol/L (ref 96–106)
Creatinine, Ser: 0.99 mg/dL (ref 0.76–1.27)
Globulin, Total: 2.3 g/dL (ref 1.5–4.5)
Glucose: 515 mg/dL (ref 70–99)
Potassium: 4.3 mmol/L (ref 3.5–5.2)
Sodium: 138 mmol/L (ref 134–144)
Total Protein: 7.3 g/dL (ref 6.0–8.5)
eGFR: 89 mL/min/{1.73_m2} (ref >59)

## 2021-12-09 LAB — LIPID PANEL
Chol/HDL Ratio: 4.6 ratio (ref 0.0–5.0)
Cholesterol, Total: 201 mg/dL — ABNORMAL HIGH (ref 100–199)
HDL: 44 mg/dL (ref >39)
LDL Chol Calc (NIH): 128 mg/dL — ABNORMAL HIGH (ref 0–99)
Triglycerides: 162 mg/dL — ABNORMAL HIGH (ref 0–149)
VLDL Cholesterol Cal: 29 mg/dL (ref 5–40)

## 2021-12-10 ENCOUNTER — Other Ambulatory Visit (INDEPENDENT_AMBULATORY_CARE_PROVIDER_SITE_OTHER): Payer: Self-pay | Admitting: Primary Care

## 2021-12-10 ENCOUNTER — Other Ambulatory Visit: Payer: Self-pay

## 2021-12-10 MED ORDER — ATORVASTATIN CALCIUM 80 MG PO TABS: 80.0000 mg | ORAL_TABLET | Freq: Every day | ORAL | 0 refills | Status: AC

## 2021-12-14 ENCOUNTER — Encounter (HOSPITAL_COMMUNITY): Payer: Self-pay | Admitting: Emergency Medicine

## 2021-12-14 ENCOUNTER — Inpatient Hospital Stay (HOSPITAL_COMMUNITY)
Admission: EM | Admit: 2021-12-14 | Discharge: 2021-12-22 | DRG: 638 | Disposition: A | Discharge status: Skilled Nursing Facility | Payer: Medicaid Other | Attending: Family Medicine | Admitting: Family Medicine

## 2021-12-14 DIAGNOSIS — Z91148 Patient's other noncompliance with medication regimen for other reason: Secondary | ICD-10-CM

## 2021-12-14 DIAGNOSIS — R338 Other retention of urine: Secondary | ICD-10-CM | POA: Diagnosis present

## 2021-12-14 DIAGNOSIS — R739 Hyperglycemia, unspecified: Secondary | ICD-10-CM

## 2021-12-14 DIAGNOSIS — E876 Hypokalemia: Secondary | ICD-10-CM

## 2021-12-14 DIAGNOSIS — I129 Hypertensive chronic kidney disease with stage 1 through stage 4 chronic kidney disease, or unspecified chronic kidney disease: Secondary | ICD-10-CM | POA: Diagnosis present

## 2021-12-14 DIAGNOSIS — E1101 Type 2 diabetes mellitus with hyperosmolarity with coma: Principal | ICD-10-CM

## 2021-12-14 DIAGNOSIS — I10 Essential (primary) hypertension: Secondary | ICD-10-CM | POA: Diagnosis present

## 2021-12-14 DIAGNOSIS — E1122 Type 2 diabetes mellitus with diabetic chronic kidney disease: Secondary | ICD-10-CM | POA: Diagnosis present

## 2021-12-14 DIAGNOSIS — K2100 Gastro-esophageal reflux disease with esophagitis, without bleeding: Secondary | ICD-10-CM | POA: Diagnosis present

## 2021-12-14 DIAGNOSIS — N1831 Chronic kidney disease, stage 3a: Secondary | ICD-10-CM | POA: Diagnosis present

## 2021-12-14 DIAGNOSIS — N401 Enlarged prostate with lower urinary tract symptoms: Secondary | ICD-10-CM | POA: Diagnosis present

## 2021-12-14 DIAGNOSIS — R112 Nausea with vomiting, unspecified: Secondary | ICD-10-CM | POA: Diagnosis present

## 2021-12-14 DIAGNOSIS — Z823 Family history of stroke: Secondary | ICD-10-CM

## 2021-12-14 DIAGNOSIS — E1165 Type 2 diabetes mellitus with hyperglycemia: Secondary | ICD-10-CM | POA: Diagnosis present

## 2021-12-14 DIAGNOSIS — I69354 Hemiplegia and hemiparesis following cerebral infarction affecting left non-dominant side: Secondary | ICD-10-CM

## 2021-12-14 DIAGNOSIS — N179 Acute kidney failure, unspecified: Secondary | ICD-10-CM | POA: Diagnosis present

## 2021-12-14 DIAGNOSIS — Z7984 Long term (current) use of oral hypoglycemic drugs: Secondary | ICD-10-CM

## 2021-12-14 DIAGNOSIS — Z794 Long term (current) use of insulin: Secondary | ICD-10-CM

## 2021-12-14 DIAGNOSIS — F17210 Nicotine dependence, cigarettes, uncomplicated: Secondary | ICD-10-CM | POA: Diagnosis present

## 2021-12-14 DIAGNOSIS — I69320 Aphasia following cerebral infarction: Secondary | ICD-10-CM

## 2021-12-14 LAB — CBC
HCT: 47.4 % (ref 39.0–52.0)
Hemoglobin: 15.9 g/dL (ref 13.0–17.0)
MCH: 29.6 pg (ref 26.0–34.0)
MCHC: 33.5 g/dL (ref 30.0–36.0)
MCV: 88.1 fL (ref 80.0–100.0)
Platelets: 389 10*3/uL (ref 150–400)
RBC: 5.38 MIL/uL (ref 4.22–5.81)
RDW: 13.3 % (ref 11.5–15.5)
WBC: 10.4 10*3/uL (ref 4.0–10.5)
nRBC: 0 % (ref 0.0–0.2)

## 2021-12-14 LAB — COMPREHENSIVE METABOLIC PANEL
ALT: 14 U/L (ref 0–44)
AST: 14 U/L — ABNORMAL LOW (ref 15–41)
Albumin: 4.4 g/dL (ref 3.5–5.0)
Alkaline Phosphatase: 105 U/L (ref 38–126)
Anion gap: 17 — ABNORMAL HIGH (ref 5–15)
BUN: 18 mg/dL (ref 6–20)
CO2: 30 mmol/L (ref 22–32)
Calcium: 10.2 mg/dL (ref 8.9–10.3)
Chloride: 95 mmol/L — ABNORMAL LOW (ref 98–111)
Creatinine, Ser: 1.08 mg/dL (ref 0.61–1.24)
GFR, Estimated: >60 mL/min (ref >60)
Glucose, Bld: 340 mg/dL — ABNORMAL HIGH (ref 70–99)
Potassium: 3.6 mmol/L (ref 3.5–5.1)
Sodium: 142 mmol/L (ref 135–145)
Total Bilirubin: 1 mg/dL (ref 0.3–1.2)
Total Protein: 8.2 g/dL — ABNORMAL HIGH (ref 6.5–8.1)

## 2021-12-14 LAB — CBG MONITORING, ED: Glucose-Capillary: 346 mg/dL — ABNORMAL HIGH (ref 70–99)

## 2021-12-14 LAB — LIPASE, BLOOD: Lipase: 20 U/L (ref 11–51)

## 2021-12-14 MED ORDER — LACTATED RINGERS IV BOLUS
1000.0000 mL | Freq: Once | INTRAVENOUS | Status: AC
  Administered 2021-12-14: 1000 mL via INTRAVENOUS

## 2021-12-14 MED ORDER — PROCHLORPERAZINE EDISYLATE 10 MG/2ML IJ SOLN
5.0000 mg | Freq: Once | INTRAMUSCULAR | Status: AC
  Administered 2021-12-14: 5 mg via INTRAVENOUS
  Filled 2021-12-14: qty 2

## 2021-12-14 MED ORDER — INSULIN ASPART 100 UNIT/ML IJ SOLN
6.0000 [IU] | Freq: Once | INTRAMUSCULAR | Status: AC
  Administered 2021-12-15: 6 [IU] via INTRAVENOUS

## 2021-12-14 MED ORDER — METOCLOPRAMIDE HCL 5 MG/ML IJ SOLN
10.0000 mg | Freq: Once | INTRAMUSCULAR | Status: AC
  Administered 2021-12-14: 10 mg via INTRAVENOUS
  Filled 2021-12-14: qty 2

## 2021-12-14 NOTE — ED Provider Triage Note (Signed)
Emergency Medicine Provider Triage Evaluation Note  Ryan Hughes , a 56 y.o. male  was evaluated in triage.  Pt brought in by EMS from home where he was reportedly having severe nausea and vomiting per family.  Patient has dementia at baseline and is not accompanied at time of my evaluation.  He is oriented to self but not to place or situation.  He has a vomit bag in his hand, denies nausea and vomiting.  Review of Systems  Positive:  Negative:   Physical Exam  BP (!) 165/109 (BP Location: Right Arm)   Pulse (!) 127   Temp 99.5 F (37.5 C) (Oral)   Resp 18   SpO2 99%  Gen:   Awake, no distress   Resp:  Normal effort  MSK:   Moves extremities without difficulty  Other:  Abdomen is soft, nondistended, not obviously tender to palpation.  Medical Decision Making  Medically screening exam initiated at 12:59 PM.  Appropriate orders placed.  Ryan Hughes was informed that the remainder of the evaluation will be completed by another provider, this initial triage assessment does not replace that evaluation, and the importance of remaining in the ED until their evaluation is complete.     Janell Quiet, [place] 12/14/21 1300

## 2021-12-14 NOTE — ED Triage Notes (Signed)
Patient BIB [facility] from home where he lives with family with complaint of nausea and vomiting. Patient is alert, oriented to self with history of dementia, and in no apparent distress at this time.

## 2021-12-14 NOTE — ED Provider Notes (Signed)
[facility] EMERGENCY DEPARTMENT Provider Note   CSN: [id number] Arrival date & time: 12/14/21  1219     History  Chief Complaint  Patient presents with   Emesis    Ryan Hughes is a 56 y.o. male.   Emesis Patient presents with nausea and vomiting.  Began earlier today.  Reportedly has thrown up 6 times since 6 AM this morning.  Has abdominal pain.  History comes from family numbers.  Reviewed previous admission notes and has had episodes of recurrent vomiting thought to be either secondary to potentially gastroparesis from his diabetes or potentially even from his marijuana use.  No diarrhea.  No known sick contacts or known questionable food.    Past Medical History:  Diagnosis Date   Diabetes mellitus without complication ([place])    Hypertension    Nicotine dependence, cigarettes, uncomplicated 63/78/5885    Home Medications Prior to Admission medications   Medication Sig Start Date End Date Taking? Authorizing Provider  aspirin 325 MG tablet Take 1 tablet (325 mg total) by mouth daily. Patient not taking: Reported on 12/08/2021 03/29/21   Aline August, MD  atorvastatin (LIPITOR) 80 MG tablet Take 1 tablet (80 mg total) by mouth at bedtime. 12/10/21   [name], NP  blood glucose meter kit and supplies KIT Dispense based on patient and insurance preference. Use up to four times daily as directed. (FOR ICD-9 250.00, 250.01). Patient not taking: Reported on 12/08/2021 04/27/20   Dwyane Dee, MD  Dulaglutide (TRULICITY) 0.27 XA/1.2IN SOPN Inject 0.75 mg into the skin once a week. 12/08/21   [name], NP  insulin glargine (LANTUS) 100 UNIT/ML Solostar Pen Inject 15 Units into the skin 2 (two) times daily. 12/08/21   [name], NP  Insulin Pen Needle 31G X 5 MM MISC Lantus 20 units daily Patient not taking: Reported on 12/08/2021 03/28/21   Aline August, MD  losartan-hydrochlorothiazide (HYZAAR) 100-25 MG tablet Take 1 tablet by mouth  daily. 12/08/21   [name], NP  metFORMIN (GLUCOPHAGE) 1000 MG tablet Take 1 tablet (1,000 mg total) by mouth 2 (two) times daily with a meal. 04/19/21 05/19/21  [name], NP  Multiple Vitamin (MULTIVITAMIN WITH MINERALS) TABS tablet Take 1 tablet by mouth daily. Patient not taking: Reported on 12/08/2021 03/29/21   Aline August, MD  ondansetron (ZOFRAN-ODT) 4 MG disintegrating tablet 72m ODT q4 hours prn nausea/vomit Patient not taking: Reported on 12/08/2021 04/11/21   Mesner, JCorene Cornea MD  pantoprazole (PROTONIX) 20 MG tablet Take 1 tablet (20 mg total) by mouth daily. 04/11/21 05/11/21  Mesner, JCorene Cornea MD  potassium chloride (KLOR-CON) 10 MEQ tablet Take 2 tablets (20 mEq total) by mouth daily. Patient not taking: Reported on 12/08/2021 04/19/21   EKerin Perna NP  thiamine 100 MG tablet Take 1 tablet (100 mg total) by mouth daily. Patient not taking: Reported on 12/08/2021 03/29/21   AAline August MD  TRUE METRIX BLOOD GLUCOSE TEST test strip  04/27/20   [provider]      Allergies    Pork-derived products and Shellfish-derived products    Review of Systems   Review of Systems  Gastrointestinal:  Positive for vomiting.    Physical Exam Updated Vital Signs BP 122/76   Pulse (!) 102   Temp 98.5 F (36.9 C) (Oral)   Resp 13   SpO2 96%  Physical Exam Vitals and nursing note reviewed.  Eyes:     Pupils: Pupils are equal, round,  and reactive to light.  Cardiovascular:     Rate and Rhythm: Tachycardia present.  Pulmonary:     Breath sounds: Normal breath sounds.  Abdominal:     Tenderness: There is no abdominal tenderness.  Musculoskeletal:        General: No tenderness.  Skin:    General: Skin is warm.     Capillary Refill: Capillary refill takes less than 2 seconds.  Neurological:     Mental Status: He is alert. Mental status is at baseline.     ED Results / Procedures / Treatments   Labs (all labs ordered are listed, but only abnormal  results are displayed) Labs Reviewed  COMPREHENSIVE METABOLIC PANEL - Abnormal; Notable for the following components:      Result Value   Chloride 95 (*)    Glucose, Bld 340 (*)    Total Protein 8.2 (*)    AST 14 (*)    Anion gap 17 (*)    All other components within normal limits  CBG MONITORING, ED - Abnormal; Notable for the following components:   Glucose-Capillary 346 (*)    All other components within normal limits  LIPASE, BLOOD  CBC  URINALYSIS, ROUTINE W REFLEX MICROSCOPIC  BASIC METABOLIC PANEL  BETA-HYDROXYBUTYRIC ACID  I-STAT VENOUS BLOOD GAS, ED    EKG EKG Interpretation  Date/Time:  Tuesday December 14 2021 20:53:08 EDT Ventricular Rate:  120 PR Interval:  122 QRS Duration: 119 QT Interval:  364 QTC Calculation: 515 R Axis:   117 Text Interpretation: Sinus tachycardia Probable left atrial enlargement Left posterior fascicular block Borderline ST depression, inferior leads Confirmed by Davonna Belling [phone]) on 12/14/2021 9:02:36 PM  Radiology No results found.  Procedures Procedures    Medications Ordered in ED Medications  lactated ringers bolus 1,000 mL (has no administration in time range)  insulin aspart (novoLOG) injection 6 Units (has no administration in time range)  metoCLOPramide (REGLAN) injection 10 mg (10 mg Intravenous Given 12/14/21 1703)  lactated ringers bolus 1,000 mL (0 mLs Intravenous Stopped 12/14/21 2050)  lactated ringers bolus 1,000 mL (0 mLs Intravenous Stopped 12/14/21 2050)  prochlorperazine (COMPAZINE) injection 5 mg (5 mg Intravenous Given 12/14/21 1856)    ED Course/ Medical Decision Making/ A&P                           Medical Decision Making Amount and/or Complexity of Data Reviewed Labs: ordered.  Risk Prescription drug management.   Patient with nausea and vomiting.  History of same.  Tachycardia.  Reviewed lab work and sugars elevated with an anion gap but normal bicarb.  DKA felt less likely but also could be  secondary to dehydration.  Will give fluid bolus and some Reglan.  Differential diagnosis includes gastroenteritis although no diarrhea but also has history of marijuana use and potentially gastroenteritis.  Will give fluids and reevaluate  Patient continues to have some hyperglycemia.  Heart rate remains elevated in the 1 teens.  Really has not tolerated any solids and only a little bit of liquids.  I think there is probably a component of effort to this however.  Still has not urinated after 3 L of fluid.  Benign abdominal exam and doubt obstruction.  Has had recent CT scans for the same do not think he needs to be reimaged.  Sugars elevated and has not really even come down with the fluids.  Will give some insulin now and will check BMP and  beta hydroxybutyrate at this time.  But I think he would benefit from admission to the hospital for further hydration.  I think the anion gap is more likely from dehydration than a DKA but can be reevaluated.        Final Clinical Impression(s) / ED Diagnoses Final diagnoses:  Nausea and vomiting, unspecified vomiting type  Hyperglycemia    Rx / DC Orders ED Discharge Orders     None         Davonna Belling, MD 12/14/21 2235

## 2021-12-14 NOTE — ED Notes (Signed)
Took pt to bathroom unable to provide urine sample

## 2021-12-15 ENCOUNTER — Encounter (HOSPITAL_COMMUNITY): Payer: Self-pay | Admitting: Internal Medicine

## 2021-12-15 ENCOUNTER — Observation Stay (HOSPITAL_COMMUNITY): Payer: Medicaid Other

## 2021-12-15 ENCOUNTER — Other Ambulatory Visit: Payer: Self-pay

## 2021-12-15 DIAGNOSIS — E1165 Type 2 diabetes mellitus with hyperglycemia: Secondary | ICD-10-CM | POA: Diagnosis not present

## 2021-12-15 DIAGNOSIS — R112 Nausea with vomiting, unspecified: Secondary | ICD-10-CM | POA: Diagnosis not present

## 2021-12-15 LAB — COMPREHENSIVE METABOLIC PANEL
ALT: 12 U/L (ref 0–44)
AST: 16 U/L (ref 15–41)
Albumin: 3.5 g/dL (ref 3.5–5.0)
Alkaline Phosphatase: 77 U/L (ref 38–126)
Anion gap: 14 (ref 5–15)
BUN: 29 mg/dL — ABNORMAL HIGH (ref 6–20)
CO2: 35 mmol/L — ABNORMAL HIGH (ref 22–32)
Calcium: 9.3 mg/dL (ref 8.9–10.3)
Chloride: 95 mmol/L — ABNORMAL LOW (ref 98–111)
Creatinine, Ser: 1.5 mg/dL — ABNORMAL HIGH (ref 0.61–1.24)
GFR, Estimated: 54 mL/min — ABNORMAL LOW (ref >60)
Glucose, Bld: 275 mg/dL — ABNORMAL HIGH (ref 70–99)
Potassium: 3.1 mmol/L — ABNORMAL LOW (ref 3.5–5.1)
Sodium: 144 mmol/L (ref 135–145)
Total Bilirubin: 0.6 mg/dL (ref 0.3–1.2)
Total Protein: 6.8 g/dL (ref 6.5–8.1)

## 2021-12-15 LAB — URINALYSIS, ROUTINE W REFLEX MICROSCOPIC
Bacteria, UA: NONE SEEN
Bilirubin Urine: NEGATIVE
Glucose, UA: >=500 mg/dL — AB
Hgb urine dipstick: NEGATIVE
Ketones, ur: 20 mg/dL — AB
Leukocytes,Ua: NEGATIVE
Nitrite: NEGATIVE
Protein, ur: 100 mg/dL — AB
Specific Gravity, Urine: 1.023 (ref 1.005–1.030)
pH: 5 (ref 5.0–8.0)

## 2021-12-15 LAB — BASIC METABOLIC PANEL
Anion gap: 14 (ref 5–15)
Anion gap: 14 (ref 5–15)
BUN: 27 mg/dL — ABNORMAL HIGH (ref 6–20)
BUN: 29 mg/dL — ABNORMAL HIGH (ref 6–20)
CO2: 32 mmol/L (ref 22–32)
CO2: 35 mmol/L — ABNORMAL HIGH (ref 22–32)
Calcium: 8.7 mg/dL — ABNORMAL LOW (ref 8.9–10.3)
Calcium: 9.3 mg/dL (ref 8.9–10.3)
Chloride: 98 mmol/L (ref 98–111)
Chloride: 96 mmol/L — ABNORMAL LOW (ref 98–111)
Creatinine, Ser: 1.39 mg/dL — ABNORMAL HIGH (ref 0.61–1.24)
Creatinine, Ser: 1.55 mg/dL — ABNORMAL HIGH (ref 0.61–1.24)
GFR, Estimated: 59 mL/min — ABNORMAL LOW (ref >60)
GFR, Estimated: 52 mL/min — ABNORMAL LOW (ref >60)
Glucose, Bld: 309 mg/dL — ABNORMAL HIGH (ref 70–99)
Glucose, Bld: 145 mg/dL — ABNORMAL HIGH (ref 70–99)
Potassium: 3.3 mmol/L — ABNORMAL LOW (ref 3.5–5.1)
Potassium: 3.3 mmol/L — ABNORMAL LOW (ref 3.5–5.1)
Sodium: 144 mmol/L (ref 135–145)
Sodium: 145 mmol/L (ref 135–145)

## 2021-12-15 LAB — I-STAT VENOUS BLOOD GAS, ED
Acid-Base Excess: 17 mmol/L — ABNORMAL HIGH (ref 0.0–2.0)
Bicarbonate: 40.9 mmol/L — ABNORMAL HIGH (ref 20.0–28.0)
Calcium, Ion: 0.97 mmol/L — ABNORMAL LOW (ref 1.15–1.40)
HCT: 40 % (ref 39.0–52.0)
Hemoglobin: 13.6 g/dL (ref 13.0–17.0)
O2 Saturation: 99 %
Potassium: 3.2 mmol/L — ABNORMAL LOW (ref 3.5–5.1)
Sodium: 142 mmol/L (ref 135–145)
TCO2: 42 mmol/L — ABNORMAL HIGH (ref 22–32)
pCO2, Ven: 41.4 mmHg — ABNORMAL LOW (ref 44–60)
pH, Ven: 7.603 (ref 7.25–7.43)
pO2, Ven: 97 mmHg — ABNORMAL HIGH (ref 32–45)

## 2021-12-15 LAB — GLUCOSE, CAPILLARY
Glucose-Capillary: 211 mg/dL — ABNORMAL HIGH (ref 70–99)
Glucose-Capillary: 191 mg/dL — ABNORMAL HIGH (ref 70–99)

## 2021-12-15 LAB — CBC
HCT: 39.8 % (ref 39.0–52.0)
Hemoglobin: 13.7 g/dL (ref 13.0–17.0)
MCH: 30.2 pg (ref 26.0–34.0)
MCHC: 34.4 g/dL (ref 30.0–36.0)
MCV: 87.7 fL (ref 80.0–100.0)
Platelets: 346 10*3/uL (ref 150–400)
RBC: 4.54 MIL/uL (ref 4.22–5.81)
RDW: 13.4 % (ref 11.5–15.5)
WBC: 11.8 10*3/uL — ABNORMAL HIGH (ref 4.0–10.5)
nRBC: 0 % (ref 0.0–0.2)

## 2021-12-15 LAB — CBG MONITORING, ED
Glucose-Capillary: 293 mg/dL — ABNORMAL HIGH (ref 70–99)
Glucose-Capillary: 141 mg/dL — ABNORMAL HIGH (ref 70–99)
Glucose-Capillary: 175 mg/dL — ABNORMAL HIGH (ref 70–99)
Glucose-Capillary: 192 mg/dL — ABNORMAL HIGH (ref 70–99)

## 2021-12-15 LAB — BETA-HYDROXYBUTYRIC ACID: Beta-Hydroxybutyric Acid: 0.56 mmol/L — ABNORMAL HIGH (ref 0.05–0.27)

## 2021-12-15 LAB — HIV ANTIBODY (ROUTINE TESTING W REFLEX): HIV Screen 4th Generation wRfx: NONREACTIVE

## 2021-12-15 MED ORDER — ONDANSETRON HCL 4 MG/2ML IJ SOLN: 4.0000 mg | Freq: Four times a day (QID) | INTRAMUSCULAR | Status: DC | PRN

## 2021-12-15 MED ORDER — PANTOPRAZOLE SODIUM 40 MG IV SOLR
40.0000 mg | Freq: Two times a day (BID) | INTRAVENOUS | Status: DC
  Administered 2021-12-15 – 2021-12-18 (×8): 40 mg via INTRAVENOUS
  Filled 2021-12-15 (×8): qty 10

## 2021-12-15 MED ORDER — CHLORHEXIDINE GLUCONATE CLOTH 2 % EX PADS
6.0000 | MEDICATED_PAD | Freq: Every day | CUTANEOUS | Status: DC
Start: 2021-12-15 — End: 2021-12-22
  Administered 2021-12-15 – 2021-12-22 (×8): 6 via TOPICAL

## 2021-12-15 MED ORDER — INSULIN ASPART 100 UNIT/ML IJ SOLN
0.0000 [IU] | INTRAMUSCULAR | Status: DC
  Administered 2021-12-15: 3 [IU] via SUBCUTANEOUS
  Administered 2021-12-15: 5 [IU] via SUBCUTANEOUS
  Administered 2021-12-15 (×2): 2 [IU] via SUBCUTANEOUS
  Administered 2021-12-15: 1 [IU] via SUBCUTANEOUS
  Administered 2021-12-15: 2 [IU] via SUBCUTANEOUS
  Administered 2021-12-16: 1 [IU] via SUBCUTANEOUS
  Administered 2021-12-16: 2 [IU] via SUBCUTANEOUS
  Administered 2021-12-17 (×2): 1 [IU] via SUBCUTANEOUS

## 2021-12-15 MED ORDER — LACTATED RINGERS IV SOLN: INTRAVENOUS | Status: AC

## 2021-12-15 MED ORDER — ONDANSETRON HCL 4 MG PO TABS: 4.0000 mg | ORAL_TABLET | Freq: Four times a day (QID) | ORAL | Status: DC | PRN

## 2021-12-15 MED ORDER — HYDRALAZINE HCL 20 MG/ML IJ SOLN
10.0000 mg | INTRAMUSCULAR | Status: DC | PRN
  Administered 2021-12-18: 10 mg via INTRAVENOUS
  Filled 2021-12-15: qty 1

## 2021-12-15 MED ORDER — TAMSULOSIN HCL 0.4 MG PO CAPS
0.4000 mg | ORAL_CAPSULE | Freq: Every day | ORAL | Status: DC
  Administered 2021-12-15 – 2021-12-16 (×2): 0.4 mg via ORAL
  Filled 2021-12-15 (×2): qty 1

## 2021-12-15 MED ORDER — ATORVASTATIN CALCIUM 80 MG PO TABS
80.0000 mg | ORAL_TABLET | Freq: Every day | ORAL | Status: DC
  Administered 2021-12-15 – 2021-12-21 (×7): 80 mg via ORAL
  Filled 2021-12-15 (×7): qty 1

## 2021-12-15 MED ORDER — POTASSIUM CHLORIDE 10 MEQ/100ML IV SOLN
10.0000 meq | INTRAVENOUS | Status: AC
  Administered 2021-12-15 (×2): 10 meq via INTRAVENOUS
  Filled 2021-12-15 (×2): qty 100

## 2021-12-15 MED ORDER — INSULIN GLARGINE-YFGN 100 UNIT/ML ~~LOC~~ SOLN
15.0000 [IU] | Freq: Two times a day (BID) | SUBCUTANEOUS | Status: DC
  Administered 2021-12-15 – 2021-12-18 (×7): 15 [IU] via SUBCUTANEOUS
  Filled 2021-12-15 (×10): qty 0.15

## 2021-12-15 NOTE — ED Notes (Signed)
Pt tolerated ginger ale.  

## 2021-12-15 NOTE — Progress Notes (Signed)
PROGRESS NOTE  Brief Narrative: Ryan Hughes is a 56 y.o. male with a history of CVA, residual aphasia and left hemiplegia, HTN, poorly-controlled T2DM (HbA1c 13.9%) who presented to the ED 8/8 for nausea and vomiting as well as difficulty urinating. He was found to be retaining urine and severely hyperglycemic. CT suggested circumferential thickening of the distal esophagus  Subjective: Pt's aphasia limits history, does identify his name, girlfriend at bedside assists with history. No vomiting since admission this morning, but hasn't tried to take anything by mouth either. No abdominal pain. He's been unable to urinate since arriving here.   Objective: BP (!) 149/122 (BP Location: Left Arm)   Pulse 95   Temp 97.9 F (36.6 C) (Oral)   Resp 18   SpO2 98%   Gen: 56yo M in no distress Pulm: Clear and nonlabored on room air  CV: RRR, no murmur, no JVD, no edema GI: Soft, NT, ND, +BS  Neuro: Alert, largely nonverbal, follows most commands slowly, left weakness noted Skin: No rashes, lesions or ulcers  CT ABD/PELVIS 12/15/2021  1. No acute intra-abdominal pathology identified. No definite radiographic explanation for the patient's reported symptoms.  2. Nodular infiltrate within the left lower lobe, new from prior examination, possibly related to infection or aspiration in the acute setting.  3. Circumferential thickening of the distal esophagus, possibly inflammatory as can be seen with esophagitis, or related to infiltrative disease. While similar to prior examination, this is not well characterized on this examination and correlation with endoscopy would be helpful for further evaluation.  4. Moderate prostatic hypertrophy. Mild bladder wall thickening may reflect changes of chronic bladder outlet obstruction.   Assessment & Plan: Principal Problem:   Nausea & vomiting Active Problems:   Intractable nausea and vomiting   HTN (hypertension)   Type 2 diabetes mellitus with hyperglycemia  (HCC)  Nausea, vomiting: No reports of coffee ground emesis or hematemesis.  - Continue IV antiemetics.  - At risk for gastroparesis, consider reglan if not able to advance diet - At risk for cannabinoid hyperemesis, counseled to avoid THC - Continue IVF as he remains without oral intake, though may advance diet as tolerated today. - With circumferential distal esophageal thickening, patient will need EGD. If concern for bleeding or unable to progress, would consult GI as inpatient, otherwise refer as outpatient (had referral made to [facility] for this per PCP note Dec 2022). Add IV PPI.   Poorly-controlled T2DM with hyperglycemia:  - Continue basal-bolus insulin for now. Pt no longer acidotic and glycemic control is improving.  - Hold metformin - Hold GLP1 agonist, which may be contributing to symptoms - Diabetes coordinator consulted to assist with education (pt not checking glucose at home, drinking large quantities of sprite, etc.)  Acute on chronic urinary retention due to BPH: Based on CT findings. LUTS noted back to 2021. Has required several I/O caths and renal function is worsening.  - Insert foley catheter and monitor. Start tamsulosin. Would benefit from urology evaluation as outpatient.  AKI:  - Monitor I/O, avoid nephrotoxins, hold lisinopril/HCTZ. 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 No new deficits. Continue home statin. Restart home ASA once hgb stable.  Tyrone Nine, MD Pager on amion 12/15/2021, 11:13 AM

## 2021-12-15 NOTE — Inpatient Diabetes Management (Addendum)
Inpatient Diabetes Program Recommendations  AACE/ADA: New Consensus Statement on Inpatient Glycemic Control  Target Ranges:  Prepandial:   less than 140 mg/dL      Peak postprandial:   less than 180 mg/dL (1-2 hours)      Critically ill patients:  140 - 180 mg/dL    Latest Reference Range & Units 12/14/21 20:54 12/15/21 01:31 12/15/21 04:09 12/15/21 07:29  Glucose-Capillary 70 - 99 mg/dL 010 (H) 272 (H) 536 (H) 175 (H)    Latest Reference Range & Units 12/14/21 12:47  CO2 22 - 32 mmol/L 30  Glucose 70 - 99 mg/dL 644 (H)  Anion gap 5 - 15  17 (H)    Latest Reference Range & Units 03/28/21 00:11 12/08/21 09:52  Hemoglobin A1C 4.0 - 5.6 % 11.2 (H) 13.9 (H)   Review of Glycemic Control  Diabetes history: DM2 Outpatient Diabetes medications: Lantus 15 units BID, Metformin 1000 mg BID, Trulicity 0.75 mg Qweek Current orders for Inpatient glycemic control: Semglee 15 units BID, Novolog 0-9 units Q4H   Inpatient Diabetes Program Recommendations:    HbgA1C:  A1C 13.9% on 12/08/21 indicating an average glucose of 352 mg/dl over the past 2-3 months.   Outpatient DM medications: Patient was advised by PCP to resume taking Trulicity and Lantus on 12/08/21 office visit (had only been taking Metformin). Question if Trulicity is contributing to N/V.  NOTE: Patient admitted with N/V, hyperglycemia, urinary retention and hypertension. Per chart, patient has hx of dementia. Noted patient seen Gwinda Passe, NP on 12/08/21 and per office note patient was only taking Metformin (not taking Lantus and Trulicity) for DM; also noted patient was not checking glucose at home and drinking a liter of Sprite per day (per niece Amy). Patient was advised to restart taking Lantus 15 units BID, Trulicity 0.75 mg Qweek, and continue Metformin.  Question if Trulicity causing N/V if patient started taking it.  Will plan to speak with patient when appropriate.  Addendum 12/15/21@11 :15-Went to speak with patient at bedside. No  family at bedside currently. Patient able to tell me his name but when asked about DM medications he stated "I don't know" when asked what he was taking. Asked if he is taking insulin and he again stated "I don't know". Patient not able to answer any general questions about DM management. Patient stated I could call his girlfriend to find out more information. Called Lucy Antigua at [phone] and after 2 rings message "Call can not be completed at this time. Try again later" (no option to leave message). Called work number for [employer] [phone]) and went straight to voicemail but mailbox  full so not able to leave a message. Tried to call Christen Butter (niece) at [phone] but got message that number is no longer in service.   Thanks, Orlando Penner, RN, MSN, CDCES Diabetes Coordinator Inpatient Diabetes Program [phone] (Team Pager from 8am to 5pm)

## 2021-12-15 NOTE — H&P (Signed)
History and Physical    Ryan Hughes LOV:[id number] DOB: [birth year] DOA: 12/14/2021  PCP: [name], NP  Patient coming from: Home.  History obtained from family.  Chief Complaint: Nausea vomiting.  HPI: Ryan Hughes is a 56 y.o. male with history of stroke with left-sided hemiplegia and aphasia, hypertension, known diabetes mellitus type 2 recent hemoglobin A1c was 13.9 a week ago to the ER the patient had multiple episodes of nausea vomiting since this morning.  Patient also has some difficulty urinating.  Has not had any chest pain shortness of breath abdominal pain.  No fever or chills.  ED Course: In the ER patient is found to be tachycardic bladder scan showed 1 L fluid and had to do In-N-Out cath.  Blood glucose was 340 with anion gap of 17 bicarb of 30 LFTs were normal.  Patient admitted for intractable nausea vomiting with uncontrolled diabetes.  Review of Systems: As per HPI, rest all negative.   Past Medical History:  Diagnosis Date   Diabetes mellitus without complication ([place])    Hypertension    Nicotine dependence, cigarettes, uncomplicated 88/41/6606    History reviewed. No pertinent surgical history.   reports that he has been smoking cigarettes. He has been smoking an average of .5 packs per day. He has never used smokeless tobacco. He reports current alcohol use. He reports current drug use. Drug: Marijuana.  Allergies  Allergen Reactions   Pork-Derived Products Other (See Comments)    Causes gout flares   Shellfish-Derived Products Other (See Comments)    Causes gout flares    Family History  Problem Relation Age of Onset   Stroke Mother     Prior to Admission medications   Medication Sig Start Date End Date Taking? Authorizing Provider  aspirin 325 MG tablet Take 1 tablet (325 mg total) by mouth daily. Patient not taking: Reported on 12/08/2021 03/29/21   Aline August, MD  atorvastatin (LIPITOR) 80 MG tablet Take 1 tablet (80 mg total) by  mouth at bedtime. 12/10/21   [name], NP  blood glucose meter kit and supplies KIT Dispense based on patient and insurance preference. Use up to four times daily as directed. (FOR ICD-9 250.00, 250.01). Patient not taking: Reported on 12/08/2021 04/27/20   Dwyane Dee, MD  Dulaglutide (TRULICITY) 3.01 SW/1.0XN SOPN Inject 0.75 mg into the skin once a week. 12/08/21   [name], NP  insulin glargine (LANTUS) 100 UNIT/ML Solostar Pen Inject 15 Units into the skin 2 (two) times daily. 12/08/21   [name], NP  Insulin Pen Needle 31G X 5 MM MISC Lantus 20 units daily Patient not taking: Reported on 12/08/2021 03/28/21   Aline August, MD  losartan-hydrochlorothiazide (HYZAAR) 100-25 MG tablet Take 1 tablet by mouth daily. 12/08/21   [name], NP  metFORMIN (GLUCOPHAGE) 1000 MG tablet Take 1 tablet (1,000 mg total) by mouth 2 (two) times daily with a meal. 04/19/21 05/19/21  [name], NP  Multiple Vitamin (MULTIVITAMIN WITH MINERALS) TABS tablet Take 1 tablet by mouth daily. Patient not taking: Reported on 12/08/2021 03/29/21   Aline August, MD  ondansetron (ZOFRAN-ODT) 4 MG disintegrating tablet 44m ODT q4 hours prn nausea/vomit Patient not taking: Reported on 12/08/2021 04/11/21   Mesner, JCorene Cornea MD  pantoprazole (PROTONIX) 20 MG tablet Take 1 tablet (20 mg total) by mouth daily. 04/11/21 05/11/21  Mesner, JCorene Cornea MD  potassium chloride (KLOR-CON) 10 MEQ tablet Take 2 tablets (20 mEq total) by mouth daily. Patient  not taking: Reported on 12/08/2021 04/19/21   [name], NP  thiamine 100 MG tablet Take 1 tablet (100 mg total) by mouth daily. Patient not taking: Reported on 12/08/2021 03/29/21   Aline August, MD  TRUE METRIX BLOOD GLUCOSE TEST test strip  04/27/20   [provider]    Physical Exam: Constitutional: Moderately built and nourished. Vitals:   12/14/21 2130 12/14/21 2145 12/14/21 2200 12/14/21 2245  BP: (!) 133/90 (!) 158/88  122/76 113/85  Pulse: (!) 114 (!) 115 (!) 102 (!) 109  Resp: (!) 21 14 13  (!) 22  Temp:      TempSrc:      SpO2: 94% 98% 96% 96%   Eyes: Anicteric no pallor. ENMT: No discharge from the ears eyes nose and mouth. Neck: No mass felt.  No neck rigidity. Respiratory: No rhonchi or crepitations. Cardiovascular: S1-S2 heard. Abdomen: Soft nontender bowel sounds present. Musculoskeletal: No edema. Skin: No rash. Neurologic: Alert awake oriented but patient is noncommunicative.  Follows commands.  Left-sided hemiplegia from previous stroke. Psychiatric: Alert and awake.   Labs on Admission: I have personally reviewed following labs and imaging studies  CBC: Recent Labs  Lab 12/08/21 1102 12/14/21 1247  WBC 4.9 10.4  NEUTROABS 2.6  --   HGB 15.3 15.9  HCT 46.8 47.4  MCV 91 88.1  PLT 307 833   Basic Metabolic Panel: Recent Labs  Lab 12/08/21 1102 12/14/21 1247  NA 138 142  K 4.3 3.6  CL 97 95*  CO2 22 30  GLUCOSE 515* 340*  BUN 14 18  CREATININE 0.99 1.08  CALCIUM 10.1 10.2   GFR: Estimated Creatinine Clearance: 77 mL/min (by C-G formula based on SCr of 1.08 mg/dL). Liver Function Tests: Recent Labs  Lab 12/08/21 1102 12/14/21 1247  AST 8 14*  ALT 10 14  ALKPHOS 130* 105  BILITOT 0.4 1.0  PROT 7.3 8.2*  ALBUMIN 5.0* 4.4   Recent Labs  Lab 12/14/21 1247  LIPASE 20   No results for input(s): "AMMONIA" in the last 168 hours. Coagulation Profile: No results for input(s): "INR", "PROTIME" in the last 168 hours. Cardiac Enzymes: No results for input(s): "CKTOTAL", "CKMB", "CKMBINDEX", "TROPONINI" in the last 168 hours. BNP (last 3 results) No results for input(s): "PROBNP" in the last 8760 hours. HbA1C: No results for input(s): "HGBA1C" in the last 72 hours. CBG: Recent Labs  Lab 12/14/21 2054  GLUCAP 346*   Lipid Profile: No results for input(s): "CHOL", "HDL", "LDLCALC", "TRIG", "CHOLHDL", "LDLDIRECT" in the last 72 hours. Thyroid Function  Tests: No results for input(s): "TSH", "T4TOTAL", "FREET4", "T3FREE", "THYROIDAB" in the last 72 hours. Anemia Panel: No results for input(s): "VITAMINB12", "FOLATE", "FERRITIN", "TIBC", "IRON", "RETICCTPCT" in the last 72 hours. Urine analysis:    Component Value Date/Time   COLORURINE YELLOW 03/27/2021 2005   APPEARANCEUR CLEAR 03/27/2021 2005   LABSPEC >1.046 (H) 03/27/2021 2005   PHURINE 5.0 03/27/2021 2005   GLUCOSEU >=500 (A) 03/27/2021 2005   HGBUR NEGATIVE 03/27/2021 2005   BILIRUBINUR NEGATIVE 03/27/2021 2005   BILIRUBINUR negative 12/31/2020 [place] 03/27/2021 2005   PROTEINUR NEGATIVE 03/27/2021 2005   UROBILINOGEN 0.2 12/31/2020 1548   UROBILINOGEN 0.2 07/04/2010 0959   NITRITE NEGATIVE 03/27/2021 2005   LEUKOCYTESUR NEGATIVE 03/27/2021 2005   Sepsis Labs: @LABRCNTIP (procalcitonin:4,lacticidven:4) )No results found for this or any previous visit (from the past 240 hour(s)).   Radiological Exams on Admission: No results found.  EKG: Independently reviewed.  Sinus tachycardia.  Assessment/Plan Principal Problem:   Nausea & vomiting Active Problems:   Intractable nausea and vomiting   HTN (hypertension)   Type 2 diabetes mellitus with hyperglycemia (HCC)    Intractable nausea vomiting cause not clear.  Could be from diabetic gastroparesis or from possible developing DKA.  I have ordered CT abdomen pelvis which is pending.  If patient's nausea vomiting subsides may start diet.  Will have further plans based on the CAT scan findings.  Patient also uses marijuana which can contribute. Uncontrolled diabetes mellitus type 2 with hyperglycemia and elevated anion gap recent hemoglobin A1c last week was 13.9.  I have ordered a repeat metabolic panel if anion gap worsens will start patient on IV insulin infusion.  Otherwise for now monitoring patient's home dose of Lantus with every 4 sliding scale coverage.  Follow metabolic panel and trends and  CBG. Urinary retention requiring in and out cath had 1 L of fluid retained.  Will check bladder scan every 4 hourly to make sure patient is voiding.  Follow CT scan results to see if there is any bladder obstruction.  Further plans accordingly. Hypertension will keep patient on.  IV hydralazine for now if patient can take oral medication restart home med dose. History of stroke with left-sided hemiplegia.  Continue home medications.   DVT prophylaxis: Lovenox. Code Status: Full code. Family Communication: Family at the bedside. Disposition Plan: Home. Consults called: None. Admission status: Observation.   Rise Patience MD [facility] Pager [phone].  If 7PM-7AM, please contact night-coverage [email or web address] Password [email or web address]  12/15/2021, 12:26 AM

## 2021-12-15 NOTE — Care Management (Signed)
  Transition of Care [facility]) Screening Note   Patient Details  Name: Elison Worrel Date of Birth: [birth year]   [facility]) CM/SW Contact:    Gala Lewandowsky, RN Phone Number: 12/15/2021, 2:21 PM    Transition of Care Department [facility]) has reviewed the patient and no TOC needs have been identified at this time. Patient has insurance and PCP at this time. Case Manager will continue to monitor patient advancement through interdisciplinary progression rounds. If new patient transition needs arise, please place a TOC consult.

## 2021-12-16 ENCOUNTER — Telehealth (INDEPENDENT_AMBULATORY_CARE_PROVIDER_SITE_OTHER): Payer: Self-pay

## 2021-12-16 DIAGNOSIS — I69320 Aphasia following cerebral infarction: Secondary | ICD-10-CM | POA: Diagnosis not present

## 2021-12-16 DIAGNOSIS — F17210 Nicotine dependence, cigarettes, uncomplicated: Secondary | ICD-10-CM | POA: Diagnosis present

## 2021-12-16 DIAGNOSIS — Z7984 Long term (current) use of oral hypoglycemic drugs: Secondary | ICD-10-CM | POA: Diagnosis not present

## 2021-12-16 DIAGNOSIS — I69354 Hemiplegia and hemiparesis following cerebral infarction affecting left non-dominant side: Secondary | ICD-10-CM | POA: Diagnosis not present

## 2021-12-16 DIAGNOSIS — E1101 Type 2 diabetes mellitus with hyperosmolarity with coma: Secondary | ICD-10-CM | POA: Diagnosis present

## 2021-12-16 DIAGNOSIS — E1122 Type 2 diabetes mellitus with diabetic chronic kidney disease: Secondary | ICD-10-CM | POA: Diagnosis present

## 2021-12-16 DIAGNOSIS — I10 Essential (primary) hypertension: Secondary | ICD-10-CM

## 2021-12-16 DIAGNOSIS — N401 Enlarged prostate with lower urinary tract symptoms: Secondary | ICD-10-CM | POA: Diagnosis present

## 2021-12-16 DIAGNOSIS — I129 Hypertensive chronic kidney disease with stage 1 through stage 4 chronic kidney disease, or unspecified chronic kidney disease: Secondary | ICD-10-CM | POA: Diagnosis present

## 2021-12-16 DIAGNOSIS — E876 Hypokalemia: Secondary | ICD-10-CM

## 2021-12-16 DIAGNOSIS — R112 Nausea with vomiting, unspecified: Secondary | ICD-10-CM | POA: Diagnosis present

## 2021-12-16 DIAGNOSIS — N179 Acute kidney failure, unspecified: Secondary | ICD-10-CM | POA: Diagnosis present

## 2021-12-16 DIAGNOSIS — Z823 Family history of stroke: Secondary | ICD-10-CM | POA: Diagnosis not present

## 2021-12-16 DIAGNOSIS — Z91148 Patient's other noncompliance with medication regimen for other reason: Secondary | ICD-10-CM | POA: Diagnosis not present

## 2021-12-16 DIAGNOSIS — K2100 Gastro-esophageal reflux disease with esophagitis, without bleeding: Secondary | ICD-10-CM | POA: Diagnosis present

## 2021-12-16 DIAGNOSIS — E1165 Type 2 diabetes mellitus with hyperglycemia: Secondary | ICD-10-CM | POA: Diagnosis not present

## 2021-12-16 DIAGNOSIS — R739 Hyperglycemia, unspecified: Secondary | ICD-10-CM | POA: Diagnosis not present

## 2021-12-16 DIAGNOSIS — N1831 Chronic kidney disease, stage 3a: Secondary | ICD-10-CM | POA: Diagnosis present

## 2021-12-16 DIAGNOSIS — R338 Other retention of urine: Secondary | ICD-10-CM | POA: Diagnosis present

## 2021-12-16 DIAGNOSIS — Z794 Long term (current) use of insulin: Secondary | ICD-10-CM | POA: Diagnosis not present

## 2021-12-16 LAB — GLUCOSE, CAPILLARY
Glucose-Capillary: 111 mg/dL — ABNORMAL HIGH (ref 70–99)
Glucose-Capillary: 113 mg/dL — ABNORMAL HIGH (ref 70–99)
Glucose-Capillary: 122 mg/dL — ABNORMAL HIGH (ref 70–99)
Glucose-Capillary: 187 mg/dL — ABNORMAL HIGH (ref 70–99)
Glucose-Capillary: 95 mg/dL (ref 70–99)
Glucose-Capillary: 97 mg/dL (ref 70–99)

## 2021-12-16 LAB — BASIC METABOLIC PANEL
Anion gap: 8 (ref 5–15)
BUN: 24 mg/dL — ABNORMAL HIGH (ref 6–20)
CO2: 30 mmol/L (ref 22–32)
Calcium: 8.9 mg/dL (ref 8.9–10.3)
Chloride: 101 mmol/L (ref 98–111)
Creatinine, Ser: 1.7 mg/dL — ABNORMAL HIGH (ref 0.61–1.24)
GFR, Estimated: 46 mL/min — ABNORMAL LOW (ref >60)
Glucose, Bld: 98 mg/dL (ref 70–99)
Potassium: 3.1 mmol/L — ABNORMAL LOW (ref 3.5–5.1)
Sodium: 139 mmol/L (ref 135–145)

## 2021-12-16 LAB — CBC
HCT: 36.6 % — ABNORMAL LOW (ref 39.0–52.0)
Hemoglobin: 12.2 g/dL — ABNORMAL LOW (ref 13.0–17.0)
MCH: 29.6 pg (ref 26.0–34.0)
MCHC: 33.3 g/dL (ref 30.0–36.0)
MCV: 88.8 fL (ref 80.0–100.0)
Platelets: 317 10*3/uL (ref 150–400)
RBC: 4.12 MIL/uL — ABNORMAL LOW (ref 4.22–5.81)
RDW: 13.1 % (ref 11.5–15.5)
WBC: 13.8 10*3/uL — ABNORMAL HIGH (ref 4.0–10.5)
nRBC: 0 % (ref 0.0–0.2)

## 2021-12-16 LAB — MAGNESIUM: Magnesium: 1.7 mg/dL (ref 1.7–2.4)

## 2021-12-16 MED ORDER — POTASSIUM CHLORIDE CRYS ER 20 MEQ PO TBCR
40.0000 meq | EXTENDED_RELEASE_TABLET | Freq: Once | ORAL | Status: AC
  Administered 2021-12-16: 40 meq via ORAL
  Filled 2021-12-16: qty 2

## 2021-12-16 NOTE — Plan of Care (Signed)

## 2021-12-16 NOTE — Telephone Encounter (Signed)
-----   Message from Ross Ludwig, RN sent at 12/16/2021  1:29 PM EDT ----- Pt called, unable to LVM d/t VM full. Unsuccessful attempts x 3. Please advise

## 2021-12-16 NOTE — Progress Notes (Signed)
PROGRESS NOTE  Brief Narrative: Ryan Hughes is a 56 y.o. male with a history of CVA, residual aphasia and left hemiplegia, HTN, poorly-controlled T2DM (HbA1c 13.9%) who presented to the ED 8/8 for nausea and vomiting as well as difficulty urinating. He was found to be retaining urine and severely hyperglycemic. CT suggested circumferential thickening of the distal esophagus    Subjective: No overnight issues. Denies abd pain, vomiting. Little nausea. Overall Difficult to understand him.  Objective: BP (!) 154/81 (BP Location: Right Arm)   Pulse 90   Temp 99.3 F (37.4 C) (Oral)   Resp 19   Wt 81.4 kg   SpO2 97%   BMI 28.11 kg/m   Calm, NAD Cta no w/r with poor respiratory effort Reg s1/s2 no gallop Soft benign +bs No edema Awake and alert Mood and affect appropriate in current setting   CT ABD/PELVIS 12/15/2021  1. No acute intra-abdominal pathology identified. No definite radiographic explanation for the patient's reported symptoms.  2. Nodular infiltrate within the left lower lobe, new from prior examination, possibly related to infection or aspiration in the acute setting.  3. Circumferential thickening of the distal esophagus, possibly inflammatory as can be seen with esophagitis, or related to infiltrative disease. While similar to prior examination, this is not well characterized on this examination and correlation with endoscopy would be helpful for further evaluation.  4. Moderate prostatic hypertrophy. Mild bladder wall thickening may reflect changes of chronic bladder outlet obstruction.   Assessment & Plan: Principal Problem:   Nausea & vomiting Active Problems:   Intractable nausea and vomiting   HTN (hypertension)   Type 2 diabetes mellitus with hyperglycemia (HCC)   Hypokalemia  Nausea, vomiting: No reports of coffee ground emesis or hematemesis.  - Continue IV antiemetics.  - At risk for gastroparesis, consider reglan if not able to advance diet - At risk  for cannabinoid hyperemesis, counseled to avoid THC - Continue IVF as he remains without oral intake, though may advance diet as tolerated today. - With circumferential distal esophageal thickening, patient will need EGD. If concern for bleeding or unable to progress, would consult GI as inpatient, otherwise refer as outpatient (had referral made to [facility] for this per PCP note Dec 2022).  8/10 added iv ppi bid. May consider gi consult in am  Poorly-controlled T2DM with hyperglycemia:  - Continue basal-bolus insulin for now. Pt no longer acidotic and glycemic control is improving.  - Hold metformin - Hold GLP1 agonist, which may be contributing to symptoms - Diabetes coordinator consulted to assist with education (pt not checking glucose at home, drinking large quantities of sprite, etc.) 8/10 bg variable but improved. Continue current mx  Acute on chronic urinary retention due to BPH: Based on CT findings. LUTS noted back to 2021. Has required several I/O caths and renal function is worsening.  8/10 foley.  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 No new deficits. Continue home statin. Restart home ASA once hgb stable.  Time spent:36 min  Lynn Ito, MD Pager on [facility] 12/16/2021, 7:27 PM

## 2021-12-16 NOTE — TOC Initial Note (Signed)
Transition of Care [facility]) - Initial/Assessment Note    Patient Details  Name: Ryan Hughes MRN: [id number] Date of Birth: August 23, 1965  Transition of [facility]) CM/SW Contact:    Gala Lewandowsky, RN Phone Number: 12/16/2021, 3:48 PM  Clinical Narrative:  Case Manager received a consult for SNF- MD aware to place orders for PT/OT consult. Case Manager spoke with niece at the bedside Jackey Loge [phone]). [name] takes the patient to all appointments. PCP is at the [facility]. Patient has DME cane at the bedside. Per [name], patient lives with his significant other and she has health problems as well. Case Manager will continue to follow for transition of care needs as the patient progresses.                Expected Discharge Plan: Skilled Nursing Facility Barriers to Discharge: Continued Medical Work up  Expected Discharge Plan and Services Expected Discharge Plan: Skilled Nursing Facility       Living arrangements for the past 2 months: Apartment  Prior Living Arrangements/Services Living arrangements for the past 2 months: Apartment Lives with:: Significant Other Patient language and need for interpreter reviewed:: Yes Do you feel safe going back to the place where you live?: Yes      Need for Family Participation in Patient Care: Yes (Comment) Care giver support system in place?: Yes (comment) Current home services: DME (patient has a cane) Criminal Activity/Legal Involvement Pertinent to Current Situation/Hospitalization: No - Comment as needed  Activities of Daily Living      Permission Sought/Granted Permission sought to share information with : Case Manager, Family Supports, Facility Medical sales representative    Share Information with NAME: Jackey Loge Niece @ [phone]    Emotional Assessment Appearance:: Appears stated age Attitude/Demeanor/Rapport: Unable to Assess Affect (typically observed): Unable to Assess Orientation: : Oriented  to Self Alcohol / Substance Use: Not Applicable Psych Involvement: No (comment)  Admission diagnosis:  Nausea & vomiting [R11.2] Hyperglycemia [R73.9] Nausea and vomiting, unspecified vomiting type [R11.2] Patient Active Problem List   Diagnosis Date Noted   Type 2 diabetes mellitus with hyperglycemia (HCC) 12/15/2021   Nausea & vomiting 12/15/2021   Acute CVA (cerebrovascular accident) (HCC) 03/27/2021   Type 2 diabetes mellitus with vascular disease (HCC) 03/27/2021   Intractable nausea and vomiting 08/05/2020   HTN (hypertension) 08/05/2020   Acute arterial ischemic stroke, vertebrobasilar, thalamic, left (HCC) 04/25/2020   Mixed hyperlipidemia due to type 2 diabetes mellitus (HCC) 04/25/2020   Benign prostatic hyperplasia (BPH) with straining on urination 04/25/2020   Nicotine dependence, cigarettes, uncomplicated 04/25/2020   Stroke (HCC) 04/25/2020   Rash 03/19/2013   Urinary hesitancy 03/19/2013   Back spasm 05/16/2012   Foot pain 07/18/2011   Diabetes mellitus (HCC) 06/18/2011   Tingling in extremities 06/18/2011   PCP:  Grayce Sessions, NP Pharmacy:   [facility] - [place], [place] - [address] [place] [address] Phone: [phone] Fax: [phone]  [facility] - [place], [place] - [address] [place] [address] [place] [address] Phone: [phone] Fax: [phone]  [facility] [address], [address] [place] [address] Phone: [phone] Fax: [phone]   Readmission Risk Interventions     No data to display

## 2021-12-16 NOTE — Telephone Encounter (Signed)
Called patient But no answer. Per DPR left message with [name] informing of all results and medication. Advised to return call to office with any questions or concerns. Maryjean Morn, CMA

## 2021-12-17 ENCOUNTER — Inpatient Hospital Stay (HOSPITAL_COMMUNITY): Payer: Medicaid Other

## 2021-12-17 DIAGNOSIS — R112 Nausea with vomiting, unspecified: Secondary | ICD-10-CM | POA: Diagnosis not present

## 2021-12-17 DIAGNOSIS — E876 Hypokalemia: Secondary | ICD-10-CM | POA: Diagnosis not present

## 2021-12-17 DIAGNOSIS — E1101 Type 2 diabetes mellitus with hyperosmolarity with coma: Secondary | ICD-10-CM

## 2021-12-17 DIAGNOSIS — I10 Essential (primary) hypertension: Secondary | ICD-10-CM | POA: Diagnosis not present

## 2021-12-17 HISTORY — DX: Type 2 diabetes mellitus with hyperosmolarity with coma: E11.01

## 2021-12-17 LAB — BASIC METABOLIC PANEL
Anion gap: 9 (ref 5–15)
BUN: 21 mg/dL — ABNORMAL HIGH (ref 6–20)
CO2: 28 mmol/L (ref 22–32)
Calcium: 8.6 mg/dL — ABNORMAL LOW (ref 8.9–10.3)
Chloride: 99 mmol/L (ref 98–111)
Creatinine, Ser: 1.68 mg/dL — ABNORMAL HIGH (ref 0.61–1.24)
GFR, Estimated: 47 mL/min — ABNORMAL LOW (ref >60)
Glucose, Bld: 128 mg/dL — ABNORMAL HIGH (ref 70–99)
Potassium: 3.2 mmol/L — ABNORMAL LOW (ref 3.5–5.1)
Sodium: 136 mmol/L (ref 135–145)

## 2021-12-17 LAB — GLUCOSE, CAPILLARY
Glucose-Capillary: 101 mg/dL — ABNORMAL HIGH (ref 70–99)
Glucose-Capillary: 130 mg/dL — ABNORMAL HIGH (ref 70–99)
Glucose-Capillary: 179 mg/dL — ABNORMAL HIGH (ref 70–99)
Glucose-Capillary: 79 mg/dL (ref 70–99)
Glucose-Capillary: 87 mg/dL (ref 70–99)
Glucose-Capillary: 92 mg/dL (ref 70–99)

## 2021-12-17 LAB — RAPID URINE DRUG SCREEN, HOSP PERFORMED
Amphetamines: NOT DETECTED
Barbiturates: NOT DETECTED
Benzodiazepines: NOT DETECTED
Cocaine: NOT DETECTED
Opiates: NOT DETECTED
Tetrahydrocannabinol: POSITIVE — AB

## 2021-12-17 LAB — LACTIC ACID, PLASMA
Lactic Acid, Venous: 1 mmol/L (ref 0.5–1.9)
Lactic Acid, Venous: 0.8 mmol/L (ref 0.5–1.9)

## 2021-12-17 MED ORDER — SODIUM CHLORIDE 0.9 % IV SOLN: INTRAVENOUS | Status: AC

## 2021-12-17 MED ORDER — POTASSIUM CHLORIDE 20 MEQ PO PACK
40.0000 meq | PACK | Freq: Two times a day (BID) | ORAL | Status: AC
  Administered 2021-12-17 (×2): 40 meq via ORAL
  Filled 2021-12-17 (×2): qty 2

## 2021-12-17 MED ORDER — TAMSULOSIN HCL 0.4 MG PO CAPS: 0.4000 mg | ORAL_CAPSULE | Freq: Two times a day (BID) | ORAL | Status: DC

## 2021-12-17 MED ORDER — SODIUM CHLORIDE 0.9 % IV BOLUS
1000.0000 mL | Freq: Once | INTRAVENOUS | Status: AC
  Administered 2021-12-17: 1000 mL via INTRAVENOUS

## 2021-12-17 MED ORDER — TAMSULOSIN HCL 0.4 MG PO CAPS
0.8000 mg | ORAL_CAPSULE | Freq: Every day | ORAL | Status: DC
  Administered 2021-12-17 – 2021-12-22 (×6): 0.8 mg via ORAL
  Filled 2021-12-17 (×6): qty 2

## 2021-12-17 MED ORDER — INSULIN ASPART 100 UNIT/ML IJ SOLN
4.0000 [IU] | Freq: Three times a day (TID) | INTRAMUSCULAR | Status: DC
  Administered 2021-12-17 – 2021-12-19 (×4): 4 [IU] via SUBCUTANEOUS

## 2021-12-17 MED ORDER — INSULIN ASPART 100 UNIT/ML IJ SOLN: 0.0000 [IU] | Freq: Every day | INTRAMUSCULAR | Status: DC

## 2021-12-17 MED ORDER — DUTASTERIDE 0.5 MG PO CAPS
0.5000 mg | ORAL_CAPSULE | Freq: Every day | ORAL | Status: DC
  Administered 2021-12-17 – 2021-12-22 (×6): 0.5 mg via ORAL
  Filled 2021-12-17 (×6): qty 1

## 2021-12-17 MED ORDER — INSULIN ASPART 100 UNIT/ML IJ SOLN
0.0000 [IU] | Freq: Three times a day (TID) | INTRAMUSCULAR | Status: DC
  Administered 2021-12-17 – 2021-12-20 (×3): 3 [IU] via SUBCUTANEOUS
  Administered 2021-12-20: 5 [IU] via SUBCUTANEOUS
  Administered 2021-12-20 – 2021-12-21 (×2): 2 [IU] via SUBCUTANEOUS
  Administered 2021-12-21: 3 [IU] via SUBCUTANEOUS
  Administered 2021-12-22: 8 [IU] via SUBCUTANEOUS
  Administered 2021-12-22: 2 [IU] via SUBCUTANEOUS

## 2021-12-17 NOTE — Progress Notes (Addendum)
[facility] PROGRESS NOTE    Progress Note  Ryan Hughes  [id number] DOB: 18-Oct-1965 DOA: 12/14/2021 PCP: [name], NP     Brief Narrative:   Ryan Hughes is an 56 y.o. male past medical history of CVA, residual aphasia and left hemiplegia, essential hypertension, poorly controlled diabetes mellitus with a last A1c of 14 presents to the ED with nausea vomiting, greater than 500 on admission.    Assessment/Plan:   Intractable nausea & vomiting: Started on antiemetics and IV PPI, there is a concern of gastroparesis. Check UDS for cannabis He is currently tolerating heart healthy diet. CT of the head showed distal esophageal thickening.  Hyperglycemic hyperosmolar nonketotic state, diabetes mellitus type 2: He relates he has been noncompliant with his medication.  Obviously proven by an A1c of 14. Anion gap of 17, bicarb 30 on metformin at home check lactic acid. Started on IV insulin drip, and his blood glucose has significantly improved and has been transition to long-acting insulin plus sliding scale.  Acute kidney injury on chronic kidney disease stage IIIa: Multifactorial likely prerenal azotemia plus acute urinary retention in the setting of ACE inhibitor and diuretic use. Foley's was already inserted to get a renal ultrasound. On admission his creatinine was 1.0 which is his baseline. Now 1.8, aggressive fluid resuscitate him.  Acute on chronic urinary retention due to BPH: CT showing large prostate. Foley inserted on 12/16/2021. Continue Flomax, add Avodart. Follow up with urology in  2 weeks.  Tobacco abuse: Counseling.  Hypokalemia: Repleted orally now resolved.  History of CVA in 2021: With left-sided hemiparesis. Continue home dose of aspirin.    DVT prophylaxis: lovenox Family Communication:none Status is: Inpatient Remains inpatient appropriate because: Hyperosmolar hyperglycemic state and acute kidney injury    Code Status:      Code Status Orders  (From admission, onward)           Start     Ordered   12/15/21 0025  Full code  Continuous        12/15/21 0025           Code Status History     Date Active Date Inactive Code Status Order ID Comments User Context   03/27/2021 2050 03/28/2021 2355 Full Code [id number]  Rise Patience, MD ED   08/05/2020 2120 08/07/2020 1810 Full Code [id number]  Etta Quill, DO ED   04/25/2020 0234 04/27/2020 2211 Full Code [id number]  Shalhoub, Sherryll Burger, MD ED         IV Access:   Peripheral IV   Procedures and diagnostic studies:   No results found.   Medical Consultants:   None.   Subjective:    Ryan Hughes tolerating his diet no complaints.  Objective:    Vitals:   12/16/21 1407 12/16/21 2000 12/16/21 2135 12/17/21 0458  BP: (!) 154/81  (!) 150/93 (!) 170/93  Pulse:   95 85  Resp:   15 14  Temp: 99.3 F (37.4 C)  98.9 F (37.2 C) 99.2 F (37.3 C)  TempSrc: Oral  Oral Oral  SpO2:  96% 97% 97%  Weight:    81.1 kg   SpO2: 97 %   Intake/Output Summary (Last 24 hours) at 12/17/2021 0841 Last data filed at 12/17/2021 0611 Gross per 24 hour  Intake 150 ml  Output 1050 ml  Net -900 ml   Filed Weights   12/16/21 0419 12/17/21 0458  Weight: 81.4 kg 81.1 kg    Exam: General exam:  In no acute distress. Respiratory system: Good air movement and clear to auscultation. Cardiovascular system: S1 & S2 heard, RRR. No JVD. Gastrointestinal system: Abdomen is nondistended, soft and nontender.  Extremities: No pedal edema. Skin: No rashes, lesions or ulcers Psychiatry: Judgement and insight appear normal. Mood & affect appropriate.    Data Reviewed:    Labs: Basic Metabolic Panel: Recent Labs  Lab 12/15/21 0032 12/15/21 0040 12/15/21 0205 12/15/21 0540 12/16/21 0356 12/17/21 0330  NA 144 142 144 145 139 136  K 3.3* 3.2* 3.1* 3.3* 3.1* 3.2*  CL 98  --  95* 96* 101 99  CO2 32  --  35* 35* 30 28  GLUCOSE 309*  --   275* 145* 98 128*  BUN 27*  --  29* 29* 24* 21*  CREATININE 1.39*  --  1.50* 1.55* 1.70* 1.68*  CALCIUM 8.7*  --  9.3 9.3 8.9 8.6*  MG  --   --   --   --  1.7  --    GFR Estimated Creatinine Clearance: 49.5 mL/min (A) (by C-G formula based on SCr of 1.68 mg/dL (H)). Liver Function Tests: Recent Labs  Lab 12/14/21 1247 12/15/21 0205  AST 14* 16  ALT 14 12  ALKPHOS 105 77  BILITOT 1.0 0.6  PROT 8.2* 6.8  ALBUMIN 4.4 3.5   Recent Labs  Lab 12/14/21 1247  LIPASE 20   No results for input(s): "AMMONIA" in the last 168 hours. Coagulation profile No results for input(s): "INR", "PROTIME" in the last 168 hours. COVID-19 Labs  No results for input(s): "DDIMER", "FERRITIN", "LDH", "CRP" in the last 72 hours.  Lab Results  Component Value Date   SARSCOV2NAA NEGATIVE 03/27/2021   SARSCOV2NAA NEGATIVE 08/05/2020   SARSCOV2NAA NEGATIVE 04/24/2020    CBC: Recent Labs  Lab 12/14/21 1247 12/15/21 0040 12/15/21 0205 12/16/21 0356  WBC 10.4  --  11.8* 13.8*  HGB 15.9 13.6 13.7 12.2*  HCT 47.4 40.0 39.8 36.6*  MCV 88.1  --  87.7 88.8  PLT 389  --  346 317   Cardiac Enzymes: No results for input(s): "CKTOTAL", "CKMB", "CKMBINDEX", "TROPONINI" in the last 168 hours. BNP (last 3 results) No results for input(s): "PROBNP" in the last 8760 hours. CBG: Recent Labs  Lab 12/16/21 1152 12/16/21 1704 12/16/21 2018 12/17/21 0010 12/17/21 0815  GLUCAP 113* 187* 122* 130* 92   D-Dimer: No results for input(s): "DDIMER" in the last 72 hours. Hgb A1c: No results for input(s): "HGBA1C" in the last 72 hours. Lipid Profile: No results for input(s): "CHOL", "HDL", "LDLCALC", "TRIG", "CHOLHDL", "LDLDIRECT" in the last 72 hours. Thyroid function studies: No results for input(s): "TSH", "T4TOTAL", "T3FREE", "THYROIDAB" in the last 72 hours.  Invalid input(s): "FREET3" Anemia work up: No results for input(s): "VITAMINB12", "FOLATE", "FERRITIN", "TIBC", "IRON", "RETICCTPCT" in the  last 72 hours. Sepsis Labs: Recent Labs  Lab 12/14/21 1247 12/15/21 0205 12/16/21 0356  WBC 10.4 11.8* 13.8*   Microbiology No results found for this or any previous visit (from the past 240 hour(s)).   Medications:    atorvastatin  80 mg Oral QHS   Chlorhexidine Gluconate Cloth  6 each Topical Daily   insulin aspart  0-9 Units Subcutaneous Q4H   insulin glargine-yfgn  15 Units Subcutaneous BID   pantoprazole (PROTONIX) IV  40 mg Intravenous Q12H   potassium chloride  40 mEq Oral BID   tamsulosin  0.4 mg Oral Daily   Continuous Infusions:    LOS: 1 day  Marinda Elk  [facility]  12/17/2021, 8:41 AM

## 2021-12-17 NOTE — Evaluation (Signed)
Physical Therapy Evaluation Patient Details Name: Ryan Hughes MRN: [id number] DOB: 09-08-1965 Today's Date: 12/17/2021  History of Present Illness  56 y.o. male presents to [facility] hospital on 12/14/2021 with nause and vomiting. Blood glucose found to be 340 in ED, pt also retaining 1L of urine. PMH includes CVA w/ L hemiplegia and aphasia, HTN, DMII.  Clinical Impression  Pt presents to PT with deficits in strength, balance, gait, and coordination. Pt tolerates therapy well today, ambulating household distances with a SPC. Halfway through ambulation, pt loses balance and requires physical assist with continued instability for the remainder of ambulation. Pt also switches hands with his cane during ambulation - could likely benefit from use of a walker (PT would like to assess this in future session). Continued therapy will assist the pt in improving upon his deficits for progression of functional independence and to maximize safety with mobility.     Recommendations for follow up therapy are one component of a multi-disciplinary discharge planning process, led by the attending physician.  Recommendations may be updated based on patient status, additional functional criteria and insurance authorization.  Follow Up Recommendations Home health PT      Assistance Recommended at Discharge Frequent or constant Supervision/Assistance  Patient can return home with the following  A little help with walking and/or transfers;A little help with bathing/dressing/bathroom;Assistance with cooking/housework;Direct supervision/assist for medications management;Direct supervision/assist for financial management;Assist for transportation    Equipment Recommendations Rolling walker (2 wheels)  Recommendations for Other Services       Functional Status Assessment Patient has had a recent decline in their functional status and/or demonstrates limited ability to make significant improvements in function in a  reasonable and predictable amount of time     Precautions / Restrictions Precautions Precautions: Fall Precaution Comments: hx of L hemiparesis, aphasia Restrictions Weight Bearing Restrictions: No      Mobility  Bed Mobility Overal bed mobility: Needs Assistance Bed Mobility: Supine to Sit, Sit to Supine     Supine to sit: Supervision, HOB elevated Sit to supine: Supervision, HOB elevated   General bed mobility comments: increasesd time/effort; requires frequent multimodal cueing    Transfers Overall transfer level: Needs assistance Equipment used: Straight cane Transfers: Sit to/from Stand Sit to Stand: Min guard                Ambulation/Gait Ambulation/Gait assistance: Min assist, Min guard (first half of ambulation minG; second half minA) Gait Distance (Feet): 60 Feet Assistive device: Straight cane Gait Pattern/deviations: Decreased stance time - left, Decreased step length - right, Staggering left, Staggering right Gait velocity: decreased Gait velocity interpretation: <1.31 ft/sec, indicative of household ambulator   General Gait Details: Pt with slowed gait, alternating between step-through and step-to patters. Pt with considerable unsteadiness using SPC (which he switches hands with during ambulation - typically uses in R hand). Pt with with multiple LOB during second half of ambulation, requiring physical assistance to return to bed. Pt with increased difficulty turning.  Stairs            Wheelchair Mobility    Modified Rankin (Stroke Patients Only)       Balance Overall balance assessment: Needs assistance Sitting-balance support: No upper extremity supported, Feet supported Sitting balance-Leahy Scale: Fair     Standing balance support: Single extremity supported, During functional activity, Reliant on assistive device for balance Standing balance-Leahy Scale: Poor Standing balance comment: Dynamic balance progressively worse with  increased ambulation distance  Pertinent Vitals/Pain Pain Assessment Pain Assessment: No/denies pain    Home Living Family/patient expects to be discharged to:: Private residence Living Arrangements: Spouse/significant other Available Help at Discharge: Family;Available 24 hours/day Type of Home: Apartment Home Access: Level entry       Home Layout: One level Home Equipment: Cane - single point      Prior Function Prior Level of Function : Independent/Modified Independent             Mobility Comments: Mod I with SPC; household ambulator ADLs Comments: Pt reports Ind with ADLs     Hand Dominance   Dominant Hand: Right    Extremity/Trunk Assessment   Upper Extremity Assessment Upper Extremity Assessment: Defer to OT evaluation    Lower Extremity Assessment Lower Extremity Assessment: Generalized weakness;LLE deficits/detail LLE Deficits / Details: BLE with weakness, however L is more affected LLE Sensation: WNL LLE Coordination: decreased gross motor    Cervical / Trunk Assessment Cervical / Trunk Assessment: Normal  Communication   Communication: Expressive difficulties  Cognition Arousal/Alertness: Awake/alert Behavior During Therapy: Flat affect Overall Cognitive Status: No family/caregiver present to determine baseline cognitive functioning Area of Impairment: Orientation, Following commands, Awareness, Safety/judgement                 Orientation Level: Disoriented to, Time (Unable to report day, month, or year)     Following Commands: Follows one step commands with increased time, Follows one step commands inconsistently, Follows multi-step commands inconsistently, Follows multi-step commands with increased time Safety/Judgement: Decreased awareness of safety Awareness: Emergent   General Comments: Reports feeling steady during ambulation, however, pt has multiple LOB and significant unsteadiness         General Comments General comments (skin integrity, edema, etc.): VSS on RA; pt asymptomatic throughout session    Exercises     Assessment/Plan    PT Assessment Patient needs continued PT services  PT Problem List Decreased strength;Decreased balance;Decreased coordination;Decreased mobility;Decreased cognition;Decreased knowledge of use of DME;Decreased safety awareness       PT Treatment Interventions DME instruction;Gait training;Stair training;Functional mobility training;Therapeutic activities;Therapeutic exercise;Balance training;Neuromuscular re-education;Patient/family education    PT Goals (Current goals can be found in the Care Plan section)  Acute Rehab PT Goals Patient Stated Goal: Return home PT Goal Formulation: With patient Time For Goal Achievement: 12/31/21 Potential to Achieve Goals: Good    Frequency Min 3X/week     Co-evaluation               AM-PAC PT "6 Clicks" Mobility  Outcome Measure Help needed turning from your back to your side while in a flat bed without using bedrails?: A Little Help needed moving from lying on your back to sitting on the side of a flat bed without using bedrails?: A Little Help needed moving to and from a bed to a chair (including a wheelchair)?: A Little Help needed standing up from a chair using your arms (e.g., wheelchair or bedside chair)?: A Little Help needed to walk in hospital room?: A Little Help needed climbing 3-5 steps with a railing? : A Lot 6 Click Score: 17    End of Session Equipment Utilized During Treatment: Gait belt Activity Tolerance: Patient tolerated treatment well Patient left: in bed;with bed alarm set;with call bell/phone within reach Nurse Communication: Mobility status PT Visit Diagnosis: Unsteadiness on feet (R26.81);Muscle weakness (generalized) (M62.81);Difficulty in walking, not elsewhere classified (R26.2);Other abnormalities of gait and mobility (R26.89)    Time: 7353-2992 PT  Time Calculation (min) (ACUTE  ONLY): 29 min   Charges:   PT Evaluation $PT Eval Low Complexity: 1 Low          Murlean Hark, SPT Acute Rehabilitation Office #: [phone]   Murlean Hark 12/17/2021, 12:22 PM

## 2021-12-17 NOTE — Evaluation (Signed)
Occupational Therapy Evaluation Patient Details Name: Ryan Hughes MRN: [id number] DOB: 07-06-1965 Today's Date: 12/17/2021   History of Present Illness 56 y.o. male presents to [facility] hospital on 12/14/2021 with nause and vomiting. Blood glucose found to be 340 in ED, pt also retaining 1L of urine. PMH includes CVA w/ L hemiplegia and aphasia, HTN, DMII.   Clinical Impression   PTA, pt lives with girlfriend and reports Modified Independence with ADLs and mobility using cane. Pt presents now with deficits in cognition and standing balance though question how much is baseline for pt. Overall, pt requires Min A for basic transfers using cane with cues for initiation/sequencing and balance. Pt requires Setup for UB ADL and Mod A for LB ADLs. Anticipate with continued mobility that pt will improve well enough to return home if 24/7 family support available. If assist not available, would recommend consideration of postacute rehab.      Recommendations for follow up therapy are one component of a multi-disciplinary discharge planning process, led by the attending physician.  Recommendations may be updated based on patient status, additional functional criteria and insurance authorization.   Follow Up Recommendations  Home health OT (postacute rehab if 24/7 family assist unable to be confirmed)    Assistance Recommended at Discharge Frequent or constant Supervision/Assistance  Patient can return home with the following A little help with walking and/or transfers;A little help with bathing/dressing/bathroom;Assistance with cooking/housework;Assist for transportation    Functional Status Assessment  Patient has had a recent decline in their functional status and demonstrates the ability to make significant improvements in function in a reasonable and predictable amount of time.  Equipment Recommendations  None recommended by OT    Recommendations for Other Services       Precautions / Restrictions  Precautions Precautions: Fall Precaution Comments: hx of L hemiparesis, aphasia Restrictions Weight Bearing Restrictions: No      Mobility Bed Mobility Overal bed mobility: Needs Assistance Bed Mobility: Supine to Sit     Supine to sit: Supervision, HOB elevated     General bed mobility comments: increased time/effort    Transfers Overall transfer level: Needs assistance Equipment used: Straight cane Transfers: Sit to/from Stand, Bed to chair/wheelchair/BSC Sit to Stand: Min guard     Step pivot transfers: Min assist     General transfer comment: able to stand without assist though noted to be switching cane between UE. Cues to initiate stepping to chair with increased time needed to follow through. Min A for balance d/t mild unsteadiness      Balance Overall balance assessment: Needs assistance Sitting-balance support: No upper extremity supported, Feet supported Sitting balance-Leahy Scale: Fair     Standing balance support: Single extremity supported, During functional activity Standing balance-Leahy Scale: Fair Standing balance comment: able to stand without significant support on cane though requires external + cane support for dynamic tasks                           ADL either performed or assessed with clinical judgement   ADL Overall ADL's : Needs assistance/impaired Eating/Feeding: Independent;Sitting Eating/Feeding Details (indicate cue type and reason): opening packet of crackers w/o assist Grooming: Set up;Sitting;Oral care;Wash/dry face Grooming Details (indicate cue type and reason): sitting EOB Upper Body Bathing: Set up;Sitting   Lower Body Bathing: Moderate assistance;Sit to/from stand   Upper Body Dressing : Set up;Sitting   Lower Body Dressing: Moderate assistance;Sit to/from stand Lower Body Dressing Details (indicate cue  type and reason): increased time/difficulty attempting to manage socks sitting EOB and bringing feet to self.  able to don around toes but assist needed in other aspects. Toilet Transfer: Minimal assistance;Stand-pivot   Toileting- Architect and Hygiene: Moderate assistance;Sit to/from stand         General ADL Comments: Pt with hx of CVA so question how close pt is to baseline though requiring increased assist today than with reported PLOF. Slower processing and standing balance deficits primarily     Vision Ability to See in Adequate Light: 0 Adequate Patient Visual Report: No change from baseline Vision Assessment?: No apparent visual deficits Additional Comments: appears [facility]     Perception     Praxis      Pertinent Vitals/Pain Pain Assessment Pain Assessment: No/denies pain     Hand Dominance Right   Extremity/Trunk Assessment Upper Extremity Assessment Upper Extremity Assessment: LUE deficits/detail LUE Deficits / Details: 4-/5 strength in affected L UE, fair coordination. R UE dominant and stronger on this side   Lower Extremity Assessment Lower Extremity Assessment: Defer to PT evaluation   Cervical / Trunk Assessment Cervical / Trunk Assessment: Normal   Communication Communication Communication: Expressive difficulties (expressive aphasia. answers some questions open endedly but primarily nods/shakes head)   Cognition Arousal/Alertness: Awake/alert Behavior During Therapy: Flat affect Overall Cognitive Status: No family/caregiver present to determine baseline cognitive functioning                                 General Comments: follows directions consistently, fair initiation though difficulty attending and problem solving tasks once on his feet. may be at baseline     General Comments  HR up to 90s with activity. systolic BP noted to be trending 150-170s. unable to obtain successful BP reading during session    Exercises     Shoulder Instructions      Home Living Family/patient expects to be discharged to:: Private  residence Living Arrangements: Spouse/significant other Available Help at Discharge: Family;Available 24 hours/day Type of Home: Apartment Home Access: Level entry     Home Layout: One level     Bathroom Shower/Tub: Chief Strategy Officer: Standard     Home Equipment: Cane - single point   Additional Comments: reports sister can assist prn as well      Prior Functioning/Environment Prior Level of Function : Independent/Modified Independent             Mobility Comments: use of cane for mobility ADLs Comments: Reports independence with ADLs (stands for showers), shares IADLs with girlfriend        OT Problem List: Impaired balance (sitting and/or standing);Decreased cognition;Decreased knowledge of use of DME or AE      OT Treatment/Interventions: Self-care/ADL training;Therapeutic exercise;Energy conservation;DME and/or AE instruction;Therapeutic activities;Patient/family education    OT Goals(Current goals can be found in the care plan section) Acute Rehab OT Goals Patient Stated Goal: wants some ice water OT Goal Formulation: With patient Time For Goal Achievement: 12/31/21 Potential to Achieve Goals: Good  OT Frequency: Min 2X/week    Co-evaluation              AM-PAC OT "6 Clicks" Daily Activity     Outcome Measure Help from another person eating meals?: None Help from another person taking care of personal grooming?: A Little Help from another person toileting, which includes using toliet, bedpan, or urinal?: A Lot Help from another  person bathing (including washing, rinsing, drying)?: A Lot Help from another person to put on and taking off regular upper body clothing?: A Little Help from another person to put on and taking off regular lower body clothing?: A Lot 6 Click Score: 16   End of Session Equipment Utilized During Treatment: Gait belt;Other (comment) (cane) Nurse Communication: Mobility status  Activity Tolerance: Patient  tolerated treatment well Patient left: in chair;with call bell/phone within reach;with chair alarm set (OT had to locate batteries for box)  OT Visit Diagnosis: Unsteadiness on feet (R26.81)                Time: 8295-6213 OT Time Calculation (min): 30 min Charges:  OT General Charges $OT Visit: 1 Visit OT Evaluation $OT Eval Moderate Complexity: 1 Mod OT Treatments $Self Care/Home Management : 8-22 mins  Bradd Canary, OTR/L Acute Rehab Services Office: [phone]   Lorre Munroe 12/17/2021, 8:57 AM

## 2021-12-17 NOTE — Progress Notes (Signed)
Modified Barium Swallow Progress Note  Patient Details  Name: Ryan Hughes MRN: [id number] Date of Birth: 06/03/1965  Today's Date: 12/17/2021  Modified Barium Swallow completed.  Full report located under Chart Review in the Imaging Section.  Brief recommendations include the following:  Clinical Impression  Pt presents with oropharyngeal dysphagia characterized by weak lingual manipualtion, reduced bolus cohesion, reduced pharyngeal constriction, and a pharyngeal delay. He demonstrated oral holding, lingual pumping, and premature spillage to the valleculae and pyriform sinuses. Pt exhibited grimacing whenever he tasted the barium, SLP suspects that at least some pt's oral holding was due to his distaste of the barium. Transient penetration (PAS 2) was noted once with thin liquids secondary to the pharynegal delay, but penetration did not progress beyond this normal level and no instances of aspiration were noted. Pt swallowed a 40mm barium tablet with thin liquids once additional time was given for posterior bolus propulsion. No esophageal stasis was noted during esophageal sweep. It is recommended that pt's current diet of regular texture solids and thin liquids be continued. SLP will follow briefly.   Swallow Evaluation Recommendations       SLP Diet Recommendations: Regular solids;Thin liquid   Liquid Administration via: Straw;Cup   Medication Administration: Whole meds with puree   Supervision: Patient able to self feed;Intermittent supervision to cue for compensatory strategies   Compensations: Minimize environmental distractions;Slow rate;Small sips/bites   Postural Changes: Seated upright at 90 degrees   Oral Care Recommendations: Oral care BID       Gauri Galvao [name], MS, CCC-SLP Acute Rehabilitation Services Office number [phone]  [name] 12/17/2021,2:34 PM

## 2021-12-17 NOTE — Evaluation (Signed)
Clinical/Bedside Swallow Evaluation Patient Details  Name: Ryan Hughes MRN: [id number] Date of Birth: 19-Sep-1965  Today's Date: 12/17/2021 Time: SLP Start Time (ACUTE ONLY): 1100 SLP Stop Time (ACUTE ONLY): 1115 SLP Time Calculation (min) (ACUTE ONLY): 15 min  Past Medical History:  Past Medical History:  Diagnosis Date   Diabetes mellitus without complication (HCC)    Hypertension    Nicotine dependence, cigarettes, uncomplicated 04/25/2020   Past Surgical History: History reviewed. No pertinent surgical history. HPI:  Pt is a 56 y.o. male who presented on 12/14/2021 with nause and vomiting. Blood glucose found to be 340 in ED, pt also retaining 1L of urine and found to be severely hyperglucemic. CT suggested circumferential thickening of the distal esophagus, nodular infiltrate within the left lower lobe, new from prior examination, possibly related to infection or aspiration. GI consulted for possible EGD. SLP consulted due to pt coughing with p.o. intake on 8/10. PMH: CVA w/ L hemiplegia and aphasia, HTN, DMII.    Assessment / Plan / Recommendation  Clinical Impression  Pt was seen for bedside swallow evaluation. Verbal output was limited secondary to baseline aphasia. Oral mechanism exam was [facility] and he presented with adequate, natural dentition. He tolerated all solids and liquids without s/s of aspiration. Mastication was [facility] and oral clearance adequate, but some mild oral holding was noted. Medication was administered by RN and pt required repeated attempts to swallow capsules, but was ultimately able to swallow them individually with boluses of puree. A regular texture diet with thin liquids is recommended at this time. However, considering the severity of coughing described by RN across varying consistencies, a modified barium swallow study will be conducted to further assess physiology. SLP Visit Diagnosis: Dysphagia, unspecified (R13.10)    Aspiration Risk       Diet  Recommendation Regular;Thin liquid   Liquid Administration via: Cup;Straw Medication Administration: Whole meds with puree Supervision: Patient able to self feed;Staff to assist with self feeding Compensations: Minimize environmental distractions Postural Changes: Seated upright at 90 degrees    Other  Recommendations Oral Care Recommendations: Oral care BID    Recommendations for follow up therapy are one component of a multi-disciplinary discharge planning process, led by the attending physician.  Recommendations may be updated based on patient status, additional functional criteria and insurance authorization.  Follow up Recommendations  (TBD)      Assistance Recommended at Discharge    Functional Status Assessment Patient has had a recent decline in their functional status and demonstrates the ability to make significant improvements in function in a reasonable and predictable amount of time.  Frequency and Duration min 1 x/week  1 week       Prognosis Prognosis for Safe Diet Advancement: Good      Swallow Study   General Date of Onset: 12/16/21 HPI: Pt is a 56 y.o. male who presented on 12/14/2021 with nause and vomiting. Blood glucose found to be 340 in ED, pt also retaining 1L of urine and found to be severely hyperglucemic. CT suggested circumferential thickening of the distal esophagus, nodular infiltrate within the left lower lobe, new from prior examination, possibly related to infection or aspiration. GI consulted for possible EGD. SLP consulted due to pt coughing with p.o. intake on 8/10. PMH: CVA w/ L hemiplegia and aphasia, HTN, DMII. Type of Study: Bedside Swallow Evaluation Previous Swallow Assessment: none Diet Prior to this Study: Regular;Thin liquids Temperature Spikes Noted: No Respiratory Status: Room air History of Recent Intubation: No Behavior/Cognition: Alert;Cooperative;Pleasant mood;Requires  cueing Oral Cavity Assessment: Within Functional Limits Oral  Care Completed by SLP: No Oral Cavity - Dentition: Adequate natural dentition Vision: Functional for self-feeding Self-Feeding Abilities: Needs assist Patient Positioning: Upright in bed;Postural control adequate for testing Baseline Vocal Quality: Low vocal intensity Volitional Cough: Cognitively unable to elicit Volitional Swallow: Unable to elicit    Oral/Motor/Sensory Function Overall Oral Motor/Sensory Function: Within functional limits   Ice Chips Ice chips: Within functional limits Presentation: Spoon   Thin Liquid Thin Liquid: Impaired Presentation: Cup;Straw Oral Phase Functional Implications: Oral holding    Nectar Thick Nectar Thick Liquid: Not tested   Honey Thick Honey Thick Liquid: Not tested   Puree Puree: Impaired Presentation: Spoon Oral Phase Functional Implications: Oral holding   Solid     Solid: Within functional limits Presentation: Self Fed     Ryan Hughes [name], MS, CCC-SLP Acute Rehabilitation Services Office number [phone]  [name] 12/17/2021,11:27 AM

## 2021-12-18 DIAGNOSIS — I10 Essential (primary) hypertension: Secondary | ICD-10-CM | POA: Diagnosis not present

## 2021-12-18 DIAGNOSIS — R112 Nausea with vomiting, unspecified: Secondary | ICD-10-CM | POA: Diagnosis not present

## 2021-12-18 DIAGNOSIS — E876 Hypokalemia: Secondary | ICD-10-CM | POA: Diagnosis not present

## 2021-12-18 DIAGNOSIS — E1101 Type 2 diabetes mellitus with hyperosmolarity with coma: Secondary | ICD-10-CM | POA: Diagnosis not present

## 2021-12-18 LAB — GLUCOSE, CAPILLARY
Glucose-Capillary: 115 mg/dL — ABNORMAL HIGH (ref 70–99)
Glucose-Capillary: 110 mg/dL — ABNORMAL HIGH (ref 70–99)
Glucose-Capillary: 96 mg/dL (ref 70–99)
Glucose-Capillary: 89 mg/dL (ref 70–99)

## 2021-12-18 LAB — BASIC METABOLIC PANEL
Anion gap: 7 (ref 5–15)
BUN: 12 mg/dL (ref 6–20)
CO2: 27 mmol/L (ref 22–32)
Calcium: 8.6 mg/dL — ABNORMAL LOW (ref 8.9–10.3)
Chloride: 105 mmol/L (ref 98–111)
Creatinine, Ser: 1.34 mg/dL — ABNORMAL HIGH (ref 0.61–1.24)
GFR, Estimated: >60 mL/min (ref >60)
Glucose, Bld: 74 mg/dL (ref 70–99)
Potassium: 3.3 mmol/L — ABNORMAL LOW (ref 3.5–5.1)
Sodium: 139 mmol/L (ref 135–145)

## 2021-12-18 MED ORDER — LOSARTAN POTASSIUM 50 MG PO TABS
50.0000 mg | ORAL_TABLET | Freq: Every day | ORAL | Status: DC
  Administered 2021-12-18 – 2021-12-22 (×5): 50 mg via ORAL
  Filled 2021-12-18 (×5): qty 1

## 2021-12-18 MED ORDER — LISINOPRIL 10 MG PO TABS: 10.0000 mg | ORAL_TABLET | Freq: Every day | ORAL | Status: DC

## 2021-12-18 MED ORDER — POTASSIUM CHLORIDE CRYS ER 20 MEQ PO TBCR
40.0000 meq | EXTENDED_RELEASE_TABLET | Freq: Two times a day (BID) | ORAL | Status: AC
  Administered 2021-12-18 (×2): 40 meq via ORAL
  Filled 2021-12-18 (×2): qty 2

## 2021-12-18 MED ORDER — HYDROCHLOROTHIAZIDE 12.5 MG PO TABS
12.5000 mg | ORAL_TABLET | Freq: Every day | ORAL | Status: DC
  Administered 2021-12-18 – 2021-12-22 (×5): 12.5 mg via ORAL
  Filled 2021-12-18 (×5): qty 1

## 2021-12-18 MED ORDER — ASPIRIN 81 MG PO TBEC
81.0000 mg | DELAYED_RELEASE_TABLET | Freq: Every day | ORAL | Status: DC
  Administered 2021-12-18 – 2021-12-22 (×5): 81 mg via ORAL
  Filled 2021-12-18 (×5): qty 1

## 2021-12-18 MED ORDER — ASPIRIN 325 MG PO TABS
325.0000 mg | ORAL_TABLET | Freq: Every day | ORAL | Status: DC
Start: 2021-12-18 — End: 2021-12-18

## 2021-12-18 MED ORDER — INSULIN GLARGINE-YFGN 100 UNIT/ML ~~LOC~~ SOLN
8.0000 [IU] | Freq: Two times a day (BID) | SUBCUTANEOUS | Status: DC
  Administered 2021-12-18: 8 [IU] via SUBCUTANEOUS
  Filled 2021-12-18 (×3): qty 0.08

## 2021-12-18 NOTE — Progress Notes (Signed)
Mobility Specialist Progress Note    12/18/21 1656  Mobility  Activity Ambulated with assistance in hallway  Level of Assistance Contact guard assist, steadying assist  Assistive Device Front wheel walker  Distance Ambulated (ft) 200 ft  Activity Response Tolerated well  $Mobility charge 1 Mobility   Post-Mobility: 83 HR  Pt received standing EOB w/ RN and agreeable. No complaints. Returned to chair with call bell in reach.   Rayle Nation Mobility Specialist

## 2021-12-18 NOTE — Progress Notes (Addendum)
[facility] PROGRESS NOTE    Progress Note  Ryan Hughes  [id number] DOB: 1965-06-10 DOA: 12/14/2021 PCP: [name], NP     Brief Narrative:   Ryan Hughes is an 56 y.o. male past medical history of CVA, residual aphasia and left hemiplegia, essential hypertension, poorly controlled diabetes mellitus with a last A1c of 14 presents to the ED with nausea vomiting, greater than 500 on admission.    Assessment/Plan:   Intractable nausea & vomiting: Started on antiemetics and IV PPI, there is a concern of gastroparesis. Check UDS for cannabis He is currently tolerating heart healthy diet. CT of the head showed distal esophageal thickening. I tried to call the knee several times at [phone] and has been unsuccessful  Hyperglycemic hyperosmolar nonketotic state, diabetes mellitus type 2: He relates he has been noncompliant with his medication.  Obviously proven by an A1c of 14. Anion gap of 17, bicarb 30 on metformin at home check lactic acid. Started on IV insulin drip, and his blood glucose has significantly improved and has been transition to long-acting insulin plus sliding scale.  Acute kidney injury on chronic kidney disease stage IIIa: Multifactorial likely prerenal azotemia plus acute urinary retention in the setting of ACE inhibitor and diuretic use. Foley's was already inserted to get a renal ultrasound. On admission his creatinine was 1.0 which is his baseline. Now 1.8, aggressive fluid resuscitate him.  Essential hypertension: We will start him on hydrochlorothiazide and lisinopril his blood pressure has been consistently greater than 160/100. We will need to check a basic metabolic panel in 1 week.  Acute on chronic urinary retention due to BPH: CT showing large prostate. Foley inserted on 12/16/2021. Continue Flomax, add Avodart. Follow up with urology in  2 weeks.  Tobacco abuse: Counseling.  Hypokalemia: Distantly low replete orally  recheck in the morning.  History of CVA in 2021: With left-sided hemiparesis. Continue home dose of aspirin.    DVT prophylaxis: lovenox Family Communication:none Status is: Inpatient Remains inpatient appropriate because: Hyperosmolar hyperglycemic state and acute kidney injury    Code Status:     Code Status Orders  (From admission, onward)           Start     Ordered   12/15/21 0025  Full code  Continuous        12/15/21 0025           Code Status History     Date Active Date Inactive Code Status Order ID Comments User Context   03/27/2021 2050 03/28/2021 2355 Full Code [id number]  Rise Patience, MD ED   08/05/2020 2120 08/07/2020 1810 Full Code [id number]  Etta Quill, DO ED   04/25/2020 0234 04/27/2020 2211 Full Code [id number]  Shalhoub, Sherryll Burger, MD ED         IV Access:   Peripheral IV   Procedures and diagnostic studies:   DG Swallowing Func-Speech Pathology  Result Date: 12/17/2021 Table formatting from the original result was not included. Objective Swallowing Evaluation: Type of Study: MBS-Modified Barium Swallow Study  Patient Details Name: Ryan Hughes MRN: [id number] Date of Birth: 1965-09-29 Today's Date: 12/17/2021 Time: SLP Start Time (ACUTE ONLY): 1327 -SLP Stop Time (ACUTE ONLY): 1340 SLP Time Calculation (min) (ACUTE ONLY): 13 min Past Medical History: Past Medical History: Diagnosis Date  Diabetes mellitus without complication ([place])   Hypertension   Nicotine dependence, cigarettes, uncomplicated [id number] Past Surgical History: No past surgical history on file. HPI: Pt is a 56 y.o. male  who presented on 12/14/2021 with nause and vomiting. Blood glucose found to be 340 in ED, pt also retaining 1L of urine and found to be severely hyperglucemic. CT suggested circumferential thickening of the distal esophagus, nodular infiltrate within the left lower lobe, new from prior examination, possibly related to infection or aspiration. GI consulted  for possible EGD. SLP consulted due to pt coughing with p.o. intake on 8/10. PMH: CVA w/ L hemiplegia and aphasia, HTN, DMII.  No data recorded  Recommendations for follow up therapy are one component of a multi-disciplinary discharge planning process, led by the attending physician.  Recommendations may be updated based on patient status, additional functional criteria and insurance authorization. Assessment / Plan / Recommendation   12/17/2021   1:37 PM Clinical Impressions Clinical Impression Pt presents with oropharyngeal dysphagia characterized by weak lingual manipualtion, reduced bolus cohesion, reduced pharyngeal constriction, and a pharyngeal delay. He demonstrated oral holding, lingual pumping, and premature spillage to the valleculae and pyriform sinuses. Pt exhibited grimacing whenever he tasted the barium, SLP suspects that at least some pt's oral holding was due to his distaste of the barium. Transient penetration (PAS 2) was noted once with thin liquids secondary to the pharynegal delay, but penetration did not progress beyond this normal level and no instances of aspiration were noted. Pt swallowed a 58mm barium tablet with thin liquids once additional time was given for posterior bolus propulsion. No esophageal stasis was noted during esophageal sweep. It is recommended that pt's current diet of regular texture solids and thin liquids be continued. SLP will follow briefly. SLP Visit Diagnosis Dysphagia, oropharyngeal phase (R13.12) Impact on safety and function Mild aspiration risk     12/17/2021   1:37 PM Treatment Recommendations Treatment Recommendations Therapy as outlined in treatment plan below     12/17/2021   1:37 PM Prognosis Prognosis for Safe Diet Advancement Good   12/17/2021   1:37 PM Diet Recommendations SLP Diet Recommendations Regular solids;Thin liquid Liquid Administration via Straw;Cup Medication Administration Whole meds with puree Compensations Minimize environmental  distractions;Slow rate;Small sips/bites Postural Changes Seated upright at 90 degrees     12/17/2021   1:37 PM Other Recommendations Oral Care Recommendations Oral care BID Follow Up Recommendations No SLP follow up Functional Status Assessment Patient has had a recent decline in their functional status and demonstrates the ability to make significant improvements in function in a reasonable and predictable amount of time.   12/17/2021   1:37 PM Frequency and Duration  Speech Therapy Frequency (ACUTE ONLY) min 1 x/week Treatment Duration 1 week     12/17/2021   1:37 PM Oral Phase Oral Phase Impaired Oral - Thin Cup Weak lingual manipulation;Lingual pumping;Holding of bolus;Premature spillage;Decreased bolus cohesion Oral - Thin Straw Weak lingual manipulation;Lingual pumping;Holding of bolus;Premature spillage;Decreased bolus cohesion Oral - Puree Weak lingual manipulation;Lingual pumping;Holding of bolus Oral - Regular Weak lingual manipulation;Lingual pumping;Holding of bolus    12/17/2021   1:37 PM Pharyngeal Phase Pharyngeal Phase Impaired Pharyngeal- Thin Cup Delayed swallow initiation-pyriform sinuses;Penetration/Aspiration before swallow;Reduced pharyngeal peristalsis Pharyngeal Material enters airway, remains ABOVE vocal cords then ejected out Pharyngeal- Thin Straw Delayed swallow initiation-pyriform sinuses;Reduced pharyngeal peristalsis Pharyngeal- Puree Reduced pharyngeal peristalsis;Delayed swallow initiation-vallecula Pharyngeal- Regular Reduced pharyngeal peristalsis;Delayed swallow initiation-vallecula Pharyngeal- Pill Reduced pharyngeal peristalsis;Delayed swallow initiation-vallecula;Delayed swallow initiation-pyriform sinuses    12/17/2021   1:37 PM Cervical Esophageal Phase  Cervical Esophageal Phase [facility] Shanika [name], MS, CCC-SLP Acute Rehabilitation Services Office number [phone] [name] 12/17/2021, 2:38 PM  US RENAL  Result Date: 12/17/2021 CLINICAL DATA:   Acute kidney injury EXAM: RENAL / URINARY TRACT ULTRASOUND COMPLETE COMPARISON:  Abdominal CT from 2 days ago FINDINGS: Right Kidney: Renal measurements: 11.8 x 7.4 x 6.2 cm = volume: 280 mL. No mass or hydronephrosis visualized. Increased cortical echogenicity with prominent corticomedullary differentiation. Left Kidney: Renal measurements: 11.3 x 6.8 x 6.1 cm = volume: 244 mL. No mass or hydronephrosis visualized. Increased cortical echogenicity with prominent corticomedullary differentiation. Bladder: Prominent bladder wall thickness but collapsed around a Foley catheter. IMPRESSION: 1. Medical renal disease. 2. No hydronephrosis or renal atrophy. Electronically Signed   By: Tiburcio Pea M.D.   On: 12/17/2021 09:57     Medical Consultants:   None.   Subjective:    [name] no complaints Objective:    Vitals:   12/17/21 2036 12/18/21 0000 12/18/21 0400 12/18/21 0730  BP: (!) 152/91 (!) 160/83 (!) 160/78 (!) 158/100  Pulse: 83     Resp: 16 18  20   Temp: 99 F (37.2 C) 98.7 F (37.1 C) 98.5 F (36.9 C) 99 F (37.2 C)  TempSrc: Oral Oral Oral Oral  SpO2: 99%     Weight:   80.3 kg   Height:       SpO2: 99 %   Intake/Output Summary (Last 24 hours) at 12/18/2021 0835 Last data filed at 12/18/2021 02/17/2022 Gross per 24 hour  Intake 947.86 ml  Output 1650 ml  Net -702.14 ml    Filed Weights   12/16/21 0419 12/17/21 0458 12/18/21 0400  Weight: 81.4 kg 81.1 kg 80.3 kg    Exam: General exam: In no acute distress. Respiratory system: Good air movement and clear to auscultation. Cardiovascular system: S1 & S2 heard, RRR. No JVD. Gastrointestinal system: Abdomen is nondistended, soft and nontender.  Extremities: No pedal edema. Skin: No rashes, lesions or ulcers Psychiatry: Judgement and insight appear normal. Mood & affect appropriate.   Data Reviewed:    Labs: Basic Metabolic Panel: Recent Labs  Lab 12/15/21 0205 12/15/21 0540 12/16/21 0356 12/17/21 0330  12/18/21 0244  NA 144 145 139 136 139  K 3.1* 3.3* 3.1* 3.2* 3.3*  CL 95* 96* 101 99 105  CO2 35* 35* 30 28 27   GLUCOSE 275* 145* 98 128* 74  BUN 29* 29* 24* 21* 12  CREATININE 1.50* 1.55* 1.70* 1.68* 1.34*  CALCIUM 9.3 9.3 8.9 8.6* 8.6*  MG  --   --  1.7  --   --     GFR Estimated Creatinine Clearance: 61.8 mL/min (A) (by C-G formula based on SCr of 1.34 mg/dL (H)). Liver Function Tests: Recent Labs  Lab 12/14/21 1247 12/15/21 0205  AST 14* 16  ALT 14 12  ALKPHOS 105 77  BILITOT 1.0 0.6  PROT 8.2* 6.8  ALBUMIN 4.4 3.5    Recent Labs  Lab 12/14/21 1247  LIPASE 20    No results for input(s): "AMMONIA" in the last 168 hours. Coagulation profile No results for input(s): "INR", "PROTIME" in the last 168 hours. COVID-19 Labs  No results for input(s): "DDIMER", "FERRITIN", "LDH", "CRP" in the last 72 hours.  Lab Results  Component Value Date   SARSCOV2NAA NEGATIVE 03/27/2021   SARSCOV2NAA NEGATIVE 08/05/2020   SARSCOV2NAA NEGATIVE 04/24/2020    CBC: Recent Labs  Lab 12/14/21 1247 12/15/21 0040 12/15/21 0205 12/16/21 0356  WBC 10.4  --  11.8* 13.8*  HGB 15.9 13.6 13.7 12.2*  HCT 47.4 40.0 39.8 36.6*  MCV 88.1  --  87.7 88.8  PLT 389  --  346 317    Cardiac Enzymes: No results for input(s): "CKTOTAL", "CKMB", "CKMBINDEX", "TROPONINI" in the last 168 hours. BNP (last 3 results) No results for input(s): "PROBNP" in the last 8760 hours. CBG: Recent Labs  Lab 12/17/21 1148 12/17/21 1709 12/17/21 2157 12/17/21 2301 12/18/21 0800  GLUCAP 87 179* 79 101* 115*    D-Dimer: No results for input(s): "DDIMER" in the last 72 hours. Hgb A1c: No results for input(s): "HGBA1C" in the last 72 hours. Lipid Profile: No results for input(s): "CHOL", "HDL", "LDLCALC", "TRIG", "CHOLHDL", "LDLDIRECT" in the last 72 hours. Thyroid function studies: No results for input(s): "TSH", "T4TOTAL", "T3FREE", "THYROIDAB" in the last 72 hours.  Invalid input(s):  "FREET3" Anemia work up: No results for input(s): "VITAMINB12", "FOLATE", "FERRITIN", "TIBC", "IRON", "RETICCTPCT" in the last 72 hours. Sepsis Labs: Recent Labs  Lab 12/14/21 1247 12/15/21 0205 12/16/21 0356 12/17/21 0919 12/17/21 1231  WBC 10.4 11.8* 13.8*  --   --   LATICACIDVEN  --   --   --  1.0 0.8    Microbiology No results found for this or any previous visit (from the past 240 hour(s)).   Medications:    atorvastatin  80 mg Oral QHS   Chlorhexidine Gluconate Cloth  6 each Topical Daily   dutasteride  0.5 mg Oral Daily   insulin aspart  0-15 Units Subcutaneous TID WC   insulin aspart  0-5 Units Subcutaneous QHS   insulin aspart  4 Units Subcutaneous TID WC   insulin glargine-yfgn  15 Units Subcutaneous BID   pantoprazole (PROTONIX) IV  40 mg Intravenous Q12H   tamsulosin  0.8 mg Oral Daily   Continuous Infusions:  sodium chloride 75 mL/hr at 12/17/21 1510      LOS: 2 days   Charlynne Cousins  [facility]  12/18/2021, 8:35 AM

## 2021-12-18 NOTE — Social Work (Signed)
  CSW was alerted that pt's niece is wanting pt to be DC to a SNF. CSW spoke with pt's RN to ascertain if pt was oriented enough to make the decision about a SNF. CSW attempted to call niece however went straight to VM. CSW left message. CSW reviewed PT note and SNF is not being recommended. CSW will alert MD to ascertain if pt can be assessed by  again by PT to ascertain if recommendation would be updated.

## 2021-12-19 DIAGNOSIS — R739 Hyperglycemia, unspecified: Secondary | ICD-10-CM

## 2021-12-19 DIAGNOSIS — E876 Hypokalemia: Secondary | ICD-10-CM | POA: Diagnosis not present

## 2021-12-19 DIAGNOSIS — E1101 Type 2 diabetes mellitus with hyperosmolarity with coma: Secondary | ICD-10-CM | POA: Diagnosis not present

## 2021-12-19 DIAGNOSIS — R112 Nausea with vomiting, unspecified: Secondary | ICD-10-CM | POA: Diagnosis not present

## 2021-12-19 LAB — BASIC METABOLIC PANEL
Anion gap: 7 (ref 5–15)
BUN: 11 mg/dL (ref 6–20)
CO2: 26 mmol/L (ref 22–32)
Calcium: 9 mg/dL (ref 8.9–10.3)
Chloride: 107 mmol/L (ref 98–111)
Creatinine, Ser: 1.34 mg/dL — ABNORMAL HIGH (ref 0.61–1.24)
GFR, Estimated: >60 mL/min (ref >60)
Glucose, Bld: 73 mg/dL (ref 70–99)
Potassium: 3.8 mmol/L (ref 3.5–5.1)
Sodium: 140 mmol/L (ref 135–145)

## 2021-12-19 LAB — GLUCOSE, CAPILLARY
Glucose-Capillary: 72 mg/dL (ref 70–99)
Glucose-Capillary: 119 mg/dL — ABNORMAL HIGH (ref 70–99)
Glucose-Capillary: 172 mg/dL — ABNORMAL HIGH (ref 70–99)
Glucose-Capillary: 52 mg/dL — ABNORMAL LOW (ref 70–99)
Glucose-Capillary: 167 mg/dL — ABNORMAL HIGH (ref 70–99)

## 2021-12-19 MED ORDER — PANTOPRAZOLE SODIUM 40 MG PO TBEC
40.0000 mg | DELAYED_RELEASE_TABLET | Freq: Two times a day (BID) | ORAL | Status: DC
  Administered 2021-12-19 – 2021-12-22 (×7): 40 mg via ORAL
  Filled 2021-12-19 (×7): qty 1

## 2021-12-19 MED ORDER — INSULIN GLARGINE-YFGN 100 UNIT/ML ~~LOC~~ SOLN
10.0000 [IU] | Freq: Two times a day (BID) | SUBCUTANEOUS | Status: DC
  Administered 2021-12-19 – 2021-12-22 (×6): 10 [IU] via SUBCUTANEOUS
  Filled 2021-12-19 (×9): qty 0.1

## 2021-12-19 MED ORDER — CARVEDILOL 6.25 MG PO TABS
6.2500 mg | ORAL_TABLET | Freq: Two times a day (BID) | ORAL | Status: DC
  Administered 2021-12-19 – 2021-12-22 (×7): 6.25 mg via ORAL
  Filled 2021-12-19 (×7): qty 1

## 2021-12-19 MED ORDER — GLUCAGON HCL RDNA (DIAGNOSTIC) 1 MG IJ SOLR
INTRAMUSCULAR | Status: AC
  Filled 2021-12-19: qty 1

## 2021-12-19 MED ORDER — GLUCERNA SHAKE PO LIQD
237.0000 mL | Freq: Three times a day (TID) | ORAL | Status: DC
  Administered 2021-12-20 – 2021-12-21 (×3): 237 mL via ORAL

## 2021-12-19 MED ORDER — DEXTROSE 50 % IV SOLN
INTRAVENOUS | Status: AC
  Filled 2021-12-19: qty 50

## 2021-12-19 MED ORDER — DEXTROSE 50 % IV SOLN
25.0000 g | INTRAVENOUS | Status: AC
  Administered 2021-12-19: 25 g via INTRAVENOUS

## 2021-12-19 NOTE — Social Work (Addendum)
CSW attempted to call admission staff Crystal at [facility] and had to leave a message.  9;25 AM received a call back form Crystal and she stated that she would look at referral. [employer] CSW and stated that pt did not meet their age requirement of 75 for a LTC bed. MD notified .

## 2021-12-19 NOTE — Social Work (Signed)
CSW received a return call from Niece and CSW explained that pt's current PT recommendation is HH. Niece explained that she wants pt to go to [facility] for LTC and she has spoken to facility. CSW agreed to call [facility] in the morning to confirm bed availability.

## 2021-12-19 NOTE — Progress Notes (Signed)
Hypoglycemic Event  CBG: 52 @ 2113  Treatment: D50 25 mL (12.5 gm)  Symptoms: Sweaty  Follow-up CBG: Time:2140 CBG Result:167  Possible Reasons for Event: Inadequate meal intake  Comments/MD notified [place] notified; symglee held per orders    Riley Hallum, [place]

## 2021-12-19 NOTE — NC FL2 (Signed)
[place] MEDICAID FL2 LEVEL OF CARE SCREENING TOOL     IDENTIFICATION  Patient Name: Ryan Hughes Birthdate: 1965/12/17 Sex: male Admission Date (Current Location): 12/14/2021  [place] and [place] Number:  [place] Bast [phone] Facility and Address:  [facility], [address], [place], [place] [address]      Provider Number: [id number]  Attending Physician Name and Address:  Marinda Elk, MD  Relative Name and Phone Number:  Coble,Teresa Significant other [phone]    Current Level of Care: Hospital Recommended Level of Care: Skilled Nursing Facility Prior Approval Number:    Date Approved/Denied:   PASRR Number: [id number]  Discharge Plan: SNF    Current Diagnoses: Patient Active Problem List   Diagnosis Date Noted   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   Hypokalemia 12/16/2021   Type 2 diabetes mellitus with hyperglycemia (HCC) 12/15/2021   Nausea & vomiting 12/15/2021   Acute CVA (cerebrovascular accident) (HCC) 03/27/2021   Type 2 diabetes mellitus with vascular disease (HCC) 03/27/2021   Intractable nausea and vomiting 08/05/2020   HTN (hypertension) 08/05/2020   Acute arterial ischemic stroke, vertebrobasilar, thalamic, left (HCC) 04/25/2020   Mixed hyperlipidemia due to type 2 diabetes mellitus (HCC) 04/25/2020   Benign prostatic hyperplasia (BPH) with straining on urination 04/25/2020   Nicotine dependence, cigarettes, uncomplicated 04/25/2020   Stroke (HCC) 04/25/2020   Rash 03/19/2013   Urinary hesitancy 03/19/2013   Back spasm 05/16/2012   Foot pain 07/18/2011   Diabetes mellitus (HCC) 06/18/2011   Tingling in extremities 06/18/2011    Orientation RESPIRATION BLADDER Height & Weight     Self, Time, Situation, Place  Normal Incontinent, Indwelling catheter Weight: 181 lb 9.6 oz (82.4 kg) Height:  5\' 7"  (170.2 cm)  BEHAVIORAL SYMPTOMS/MOOD NEUROLOGICAL BOWEL NUTRITION STATUS       Incontinent Diet (see discharge summary)  AMBULATORY STATUS COMMUNICATION OF NEEDS Skin   Limited Assist Verbally Normal                       Personal Care Assistance Level of Assistance  Bathing, Feeding, Dressing Bathing Assistance: Limited assistance Feeding assistance: Independent Dressing Assistance: Limited assistance     Functional Limitations Info  Sight, Hearing, Speech Sight Info: Adequate Hearing Info: Adequate Speech Info: Impaired    SPECIAL CARE FACTORS FREQUENCY  PT (By licensed PT), OT (By licensed OT)     PT Frequency: 5x week OT Frequency: 5x week            Contractures Contractures Info: Not present    Additional Factors Info  Code Status, Allergies, Insulin Sliding Scale Code Status Info: full Allergies Info: : Pork-derived Products, Shellfish-derived Products   Insulin Sliding Scale Info: Novolog: see discharge summary       Current Medications (12/19/2021):  This is the current hospital active medication list Current Facility-Administered Medications  Medication Dose Route Frequency Provider Last Rate Last Admin   aspirin EC tablet 81 mg  81 mg Oral Daily 12/21/2021, MD   81 mg at 12/18/21 1115   atorvastatin (LIPITOR) tablet 80 mg  80 mg Oral QHS 02/17/22, MD   80 mg at 12/18/21 2205   carvedilol (COREG) tablet 6.25 mg  6.25 mg Oral BID WC 2206, MD       Chlorhexidine Gluconate Cloth 2 % PADS 6 each  6 each Topical Daily Marinda Elk, MD   6 each at 12/18/21 1000   dutasteride (AVODART)  capsule 0.5 mg  0.5 mg Oral Daily Marinda Elk, MD   0.5 mg at 12/18/21 1115   hydrALAZINE (APRESOLINE) injection 10 mg  10 mg Intravenous Q4H PRN Eduard Clos, MD   10 mg at 12/18/21 2204   hydrochlorothiazide (HYDRODIURIL) tablet 12.5 mg  12.5 mg Oral Daily Marinda Elk, MD   12.5 mg at 12/18/21 1115   insulin aspart (novoLOG) injection 0-15 Units  0-15 Units Subcutaneous TID WC Marinda Elk, MD   3 Units at 12/17/21 1735   insulin aspart (novoLOG) injection 0-5 Units  0-5 Units Subcutaneous QHS Marinda Elk, MD       insulin aspart (novoLOG) injection 4 Units  4 Units Subcutaneous TID WC Marinda Elk, MD   4 Units at 12/18/21 1730   insulin glargine-yfgn (SEMGLEE) injection 10 Units  10 Units Subcutaneous BID Marinda Elk, MD       losartan (COZAAR) tablet 50 mg  50 mg Oral Daily David Stall, Darin Engels, MD   50 mg at 12/18/21 1115   ondansetron (ZOFRAN) tablet 4 mg  4 mg Oral Q6H PRN Eduard Clos, MD       Or   ondansetron [facility]) injection 4 mg  4 mg Intravenous Q6H PRN Eduard Clos, MD       pantoprazole (PROTONIX) EC tablet 40 mg  40 mg Oral BID Marinda Elk, MD       tamsulosin [facility]) capsule 0.8 mg  0.8 mg Oral Daily Marinda Elk, MD   0.8 mg at 12/18/21 1142     Discharge Medications: Please see discharge summary for a list of discharge medications.  Relevant Imaging Results:  Relevant Lab Results:   Additional Information SSN: [id number]  Lorri Frederick, LCSW

## 2021-12-19 NOTE — Progress Notes (Signed)
[facility] night cross cover note:   I was notified by RN of the patient's CBG range over the last day, noting this range to be 89-115, with the latter value representing the patient's fasting a.m. blood sugar on 12/18/2021.  This is in the context of insulin glargine 15 units SQ twice daily.  RN also conveys ongoing poor p.o. intake. I subsequently reduced dose of basal insulin by approximately half, with updated order reflecting insulin glargine 8 units sq bid. Will  closely follow for result of AM fasting blood sugar to further guide additional dose adjustments to patient's basal insulin regimen.    Newton Pigg, DO Hospitalist

## 2021-12-19 NOTE — Progress Notes (Signed)
[facility] PROGRESS NOTE    Progress Note  Amjed Strike  [id number] DOB: 10-18-1965 DOA: 12/14/2021 PCP: [name], NP     Brief Narrative:   Nicklous Florczak is an 56 y.o. male past medical history of CVA, residual aphasia and left hemiplegia, essential hypertension, poorly controlled diabetes mellitus with a last A1c of 14 presents to the ED with nausea vomiting, greater than 500 on admission.    Assessment/Plan:   Intractable nausea & vomiting GERD with esophagitis seen on CT: Change Protonix to oral, tolerating his diet.  He is likely due to esophagitis Check UDS for cannabis He is currently tolerating heart healthy diet. CT of the head showed distal esophageal thickening. Awaiting PT further evaluation  Hyperglycemic hyperosmolar nonketotic state, diabetes mellitus type 2: He relates he has been noncompliant with his medication.  Obviously proven by an A1c of 14. Continue long-acting insulin plus sliding scale blood glucose fairly controlled.  Acute kidney injury on chronic kidney disease stage IIIa: Multifactorial likely prerenal azotemia plus acute urinary retention in the setting of ACE inhibitor and diuretic use. Foley's was already inserted to get a renal ultrasound. On admission his creatinine was 1.0 which is his baseline. Creatinine has returned to baseline.  Essential hypertension: His blood pressure continues to be persistently above 160/100 continue Cozaar and hydrochlorothiazide start him on Coreg.  Acute on chronic urinary retention due to BPH: CT showing large prostate. Foley inserted on 12/16/2021. Continue Flomax, add Avodart. Follow up with urology in  2 weeks.  Tobacco abuse: Counseling.  Hypokalemia: Distantly low replete orally recheck in the morning.  History of CVA in 2021: With left-sided hemiparesis. Continue home dose of aspirin.    DVT prophylaxis: lovenox Family Communication:none Status is: Inpatient Remains  inpatient appropriate because: Hyperosmolar hyperglycemic state and acute kidney injury    Code Status:     Code Status Orders  (From admission, onward)           Start     Ordered   12/15/21 0025  Full code  Continuous        12/15/21 0025           Code Status History     Date Active Date Inactive Code Status Order ID Comments User Context   03/27/2021 2050 03/28/2021 2355 Full Code [id number]  Rise Patience, MD ED   08/05/2020 2120 08/07/2020 1810 Full Code [id number]  Etta Quill, DO ED   04/25/2020 0234 04/27/2020 2211 Full Code [id number]  Shalhoub, Sherryll Burger, MD ED         IV Access:   Peripheral IV   Procedures and diagnostic studies:   DG Swallowing Func-Speech Pathology  Result Date: 12/17/2021 Table formatting from the original result was not included. Objective Swallowing Evaluation: Type of Study: MBS-Modified Barium Swallow Study  Patient Details Name: Tyrann Gruett MRN: [id number] Date of Birth: 1965-09-24 Today's Date: 12/17/2021 Time: SLP Start Time (ACUTE ONLY): 1327 -SLP Stop Time (ACUTE ONLY): 1340 SLP Time Calculation (min) (ACUTE ONLY): 13 min Past Medical History: Past Medical History: Diagnosis Date  Diabetes mellitus without complication ([place])   Hypertension   Nicotine dependence, cigarettes, uncomplicated [id number] Past Surgical History: No past surgical history on file. HPI: Pt is a 56 y.o. male who presented on 12/14/2021 with nause and vomiting. Blood glucose found to be 340 in ED, pt also retaining 1L of urine and found to be severely hyperglucemic. CT suggested circumferential thickening of the distal esophagus, nodular infiltrate within the  left lower lobe, new from prior examination, possibly related to infection or aspiration. GI consulted for possible EGD. SLP consulted due to pt coughing with p.o. intake on 8/10. PMH: CVA w/ L hemiplegia and aphasia, HTN, DMII.  No data recorded  Recommendations for follow up therapy are one component  of a multi-disciplinary discharge planning process, led by the attending physician.  Recommendations may be updated based on patient status, additional functional criteria and insurance authorization. Assessment / Plan / Recommendation   12/17/2021   1:37 PM Clinical Impressions Clinical Impression Pt presents with oropharyngeal dysphagia characterized by weak lingual manipualtion, reduced bolus cohesion, reduced pharyngeal constriction, and a pharyngeal delay. He demonstrated oral holding, lingual pumping, and premature spillage to the valleculae and pyriform sinuses. Pt exhibited grimacing whenever he tasted the barium, SLP suspects that at least some pt's oral holding was due to his distaste of the barium. Transient penetration (PAS 2) was noted once with thin liquids secondary to the pharynegal delay, but penetration did not progress beyond this normal level and no instances of aspiration were noted. Pt swallowed a 75mm barium tablet with thin liquids once additional time was given for posterior bolus propulsion. No esophageal stasis was noted during esophageal sweep. It is recommended that pt's current diet of regular texture solids and thin liquids be continued. SLP will follow briefly. SLP Visit Diagnosis Dysphagia, oropharyngeal phase (R13.12) Impact on safety and function Mild aspiration risk     12/17/2021   1:37 PM Treatment Recommendations Treatment Recommendations Therapy as outlined in treatment plan below     12/17/2021   1:37 PM Prognosis Prognosis for Safe Diet Advancement Good   12/17/2021   1:37 PM Diet Recommendations SLP Diet Recommendations Regular solids;Thin liquid Liquid Administration via Straw;Cup Medication Administration Whole meds with puree Compensations Minimize environmental distractions;Slow rate;Small sips/bites Postural Changes Seated upright at 90 degrees     12/17/2021   1:37 PM Other Recommendations Oral Care Recommendations Oral care BID Follow Up Recommendations No SLP follow up  Functional Status Assessment Patient has had a recent decline in their functional status and demonstrates the ability to make significant improvements in function in a reasonable and predictable amount of time.   12/17/2021   1:37 PM Frequency and Duration  Speech Therapy Frequency (ACUTE ONLY) min 1 x/week Treatment Duration 1 week     12/17/2021   1:37 PM Oral Phase Oral Phase Impaired Oral - Thin Cup Weak lingual manipulation;Lingual pumping;Holding of bolus;Premature spillage;Decreased bolus cohesion Oral - Thin Straw Weak lingual manipulation;Lingual pumping;Holding of bolus;Premature spillage;Decreased bolus cohesion Oral - Puree Weak lingual manipulation;Lingual pumping;Holding of bolus Oral - Regular Weak lingual manipulation;Lingual pumping;Holding of bolus    12/17/2021   1:37 PM Pharyngeal Phase Pharyngeal Phase Impaired Pharyngeal- Thin Cup Delayed swallow initiation-pyriform sinuses;Penetration/Aspiration before swallow;Reduced pharyngeal peristalsis Pharyngeal Material enters airway, remains ABOVE vocal cords then ejected out Pharyngeal- Thin Straw Delayed swallow initiation-pyriform sinuses;Reduced pharyngeal peristalsis Pharyngeal- Puree Reduced pharyngeal peristalsis;Delayed swallow initiation-vallecula Pharyngeal- Regular Reduced pharyngeal peristalsis;Delayed swallow initiation-vallecula Pharyngeal- Pill Reduced pharyngeal peristalsis;Delayed swallow initiation-vallecula;Delayed swallow initiation-pyriform sinuses    12/17/2021   1:37 PM Cervical Esophageal Phase  Cervical Esophageal Phase [facility] Shanika [name], MS, CCC-SLP Acute Rehabilitation Services Office number [phone] [name] 12/17/2021, 2:38 PM                     US RENAL  Result Date: 12/17/2021 CLINICAL DATA:  Acute kidney injury EXAM: RENAL / URINARY TRACT ULTRASOUND COMPLETE COMPARISON:  Abdominal  CT from 2 days ago FINDINGS: Right Kidney: Renal measurements: 11.8 x 7.4 x 6.2 cm = volume: 280 mL. No mass or  hydronephrosis visualized. Increased cortical echogenicity with prominent corticomedullary differentiation. Left Kidney: Renal measurements: 11.3 x 6.8 x 6.1 cm = volume: 244 mL. No mass or hydronephrosis visualized. Increased cortical echogenicity with prominent corticomedullary differentiation. Bladder: Prominent bladder wall thickness but collapsed around a Foley catheter. IMPRESSION: 1. Medical renal disease. 2. No hydronephrosis or renal atrophy. Electronically Signed   By: Tiburcio Pea M.D.   On: 12/17/2021 09:57     Medical Consultants:   None.   Subjective:    [name] no complaints Objective:    Vitals:   12/18/21 2204 12/19/21 0040 12/19/21 0433 12/19/21 0823  BP: (!) 178/90 (!) 147/80 (!) 159/89 (!) 174/99  Pulse:   90 86  Resp:   16 15  Temp:      TempSrc:      SpO2:   99% 98%  Weight:   82.4 kg   Height:       SpO2: 98 %   Intake/Output Summary (Last 24 hours) at 12/19/2021 0843 Last data filed at 12/19/2021 0434 Gross per 24 hour  Intake 320 ml  Output 2000 ml  Net -1680 ml    Filed Weights   12/17/21 0458 12/18/21 0400 12/19/21 0433  Weight: 81.1 kg 80.3 kg 82.4 kg    Exam: General exam: In no acute distress. Respiratory system: Good air movement and clear to auscultation. Cardiovascular system: S1 & S2 heard, RRR. No JVD. Gastrointestinal system: Abdomen is nondistended, soft and nontender.  Extremities: No pedal edema. Skin: No rashes, lesions or ulcers Psychiatry: Judgement and insight appear normal. Mood & affect appropriate.   Data Reviewed:    Labs: Basic Metabolic Panel: Recent Labs  Lab 12/15/21 0540 12/16/21 0356 12/17/21 0330 12/18/21 0244 12/19/21 0217  NA 145 139 136 139 140  K 3.3* 3.1* 3.2* 3.3* 3.8  CL 96* 101 99 105 107  CO2 35* 30 28 27 26   GLUCOSE 145* 98 128* 74 73  BUN 29* 24* 21* 12 11  CREATININE 1.55* 1.70* 1.68* 1.34* 1.34*  CALCIUM 9.3 8.9 8.6* 8.6* 9.0  MG  --  1.7  --   --   --      GFR Estimated Creatinine Clearance: 62.5 mL/min (A) (by C-G formula based on SCr of 1.34 mg/dL (H)). Liver Function Tests: Recent Labs  Lab 12/14/21 1247 12/15/21 0205  AST 14* 16  ALT 14 12  ALKPHOS 105 77  BILITOT 1.0 0.6  PROT 8.2* 6.8  ALBUMIN 4.4 3.5    Recent Labs  Lab 12/14/21 1247  LIPASE 20    No results for input(s): "AMMONIA" in the last 168 hours. Coagulation profile No results for input(s): "INR", "PROTIME" in the last 168 hours. COVID-19 Labs  No results for input(s): "DDIMER", "FERRITIN", "LDH", "CRP" in the last 72 hours.  Lab Results  Component Value Date   SARSCOV2NAA NEGATIVE 03/27/2021   SARSCOV2NAA NEGATIVE 08/05/2020   SARSCOV2NAA NEGATIVE 04/24/2020    CBC: Recent Labs  Lab 12/14/21 1247 12/15/21 0040 12/15/21 0205 12/16/21 0356  WBC 10.4  --  11.8* 13.8*  HGB 15.9 13.6 13.7 12.2*  HCT 47.4 40.0 39.8 36.6*  MCV 88.1  --  87.7 88.8  PLT 389  --  346 317    Cardiac Enzymes: No results for input(s): "CKTOTAL", "CKMB", "CKMBINDEX", "TROPONINI" in the last 168 hours. BNP (last 3 results) No  results for input(s): "PROBNP" in the last 8760 hours. CBG: Recent Labs  Lab 12/18/21 0800 12/18/21 1233 12/18/21 1615 12/18/21 2206 12/19/21 0821  GLUCAP 115* 110* 96 89 72    D-Dimer: No results for input(s): "DDIMER" in the last 72 hours. Hgb A1c: No results for input(s): "HGBA1C" in the last 72 hours. Lipid Profile: No results for input(s): "CHOL", "HDL", "LDLCALC", "TRIG", "CHOLHDL", "LDLDIRECT" in the last 72 hours. Thyroid function studies: No results for input(s): "TSH", "T4TOTAL", "T3FREE", "THYROIDAB" in the last 72 hours.  Invalid input(s): "FREET3" Anemia work up: No results for input(s): "VITAMINB12", "FOLATE", "FERRITIN", "TIBC", "IRON", "RETICCTPCT" in the last 72 hours. Sepsis Labs: Recent Labs  Lab 12/14/21 1247 12/15/21 0205 12/16/21 0356 12/17/21 0919 12/17/21 1231  WBC 10.4 11.8* 13.8*  --   --    LATICACIDVEN  --   --   --  1.0 0.8    Microbiology No results found for this or any previous visit (from the past 240 hour(s)).   Medications:    aspirin EC  81 mg Oral Daily   atorvastatin  80 mg Oral QHS   Chlorhexidine Gluconate Cloth  6 each Topical Daily   dutasteride  0.5 mg Oral Daily   hydrochlorothiazide  12.5 mg Oral Daily   insulin aspart  0-15 Units Subcutaneous TID WC   insulin aspart  0-5 Units Subcutaneous QHS   insulin aspart  4 Units Subcutaneous TID WC   insulin glargine-yfgn  10 Units Subcutaneous BID   losartan  50 mg Oral Daily   pantoprazole (PROTONIX) IV  40 mg Intravenous Q12H   tamsulosin  0.8 mg Oral Daily   Continuous Infusions:     LOS: 3 days   Charlynne Cousins  [facility]  12/19/2021, 8:43 AM

## 2021-12-20 DIAGNOSIS — E1101 Type 2 diabetes mellitus with hyperosmolarity with coma: Secondary | ICD-10-CM | POA: Diagnosis not present

## 2021-12-20 DIAGNOSIS — R112 Nausea with vomiting, unspecified: Secondary | ICD-10-CM | POA: Diagnosis not present

## 2021-12-20 DIAGNOSIS — R739 Hyperglycemia, unspecified: Secondary | ICD-10-CM | POA: Diagnosis not present

## 2021-12-20 DIAGNOSIS — E876 Hypokalemia: Secondary | ICD-10-CM | POA: Diagnosis not present

## 2021-12-20 LAB — BASIC METABOLIC PANEL
Anion gap: 10 (ref 5–15)
BUN: 16 mg/dL (ref 6–20)
CO2: 25 mmol/L (ref 22–32)
Calcium: 9 mg/dL (ref 8.9–10.3)
Chloride: 103 mmol/L (ref 98–111)
Creatinine, Ser: 1.65 mg/dL — ABNORMAL HIGH (ref 0.61–1.24)
GFR, Estimated: 48 mL/min — ABNORMAL LOW (ref >60)
Glucose, Bld: 169 mg/dL — ABNORMAL HIGH (ref 70–99)
Potassium: 4 mmol/L (ref 3.5–5.1)
Sodium: 138 mmol/L (ref 135–145)

## 2021-12-20 LAB — GLUCOSE, CAPILLARY
Glucose-Capillary: 157 mg/dL — ABNORMAL HIGH (ref 70–99)
Glucose-Capillary: 218 mg/dL — ABNORMAL HIGH (ref 70–99)
Glucose-Capillary: 127 mg/dL — ABNORMAL HIGH (ref 70–99)
Glucose-Capillary: 154 mg/dL — ABNORMAL HIGH (ref 70–99)

## 2021-12-20 MED ORDER — PANTOPRAZOLE SODIUM 40 MG PO TBEC: 40.0000 mg | DELAYED_RELEASE_TABLET | Freq: Two times a day (BID) | ORAL | 0 refills | Status: DC

## 2021-12-20 MED ORDER — TAMSULOSIN HCL 0.4 MG PO CAPS
0.8000 mg | ORAL_CAPSULE | Freq: Every day | ORAL | 3 refills | Status: AC
Start: 2021-12-20 — End: ?

## 2021-12-20 MED ORDER — DUTASTERIDE 0.5 MG PO CAPS: 0.5000 mg | ORAL_CAPSULE | Freq: Every day | ORAL | 3 refills | Status: DC

## 2021-12-20 MED ORDER — INSULIN ASPART 100 UNIT/ML IJ SOLN
2.0000 [IU] | Freq: Three times a day (TID) | INTRAMUSCULAR | Status: DC
  Administered 2021-12-20 – 2021-12-22 (×6): 2 [IU] via SUBCUTANEOUS

## 2021-12-20 MED ORDER — HYDROCHLOROTHIAZIDE 12.5 MG PO TABS: 12.5000 mg | ORAL_TABLET | Freq: Every day | ORAL | 3 refills | Status: DC

## 2021-12-20 MED ORDER — GLIPIZIDE 5 MG PO TABS: 5.0000 mg | ORAL_TABLET | Freq: Two times a day (BID) | ORAL | 11 refills | Status: DC

## 2021-12-20 MED ORDER — CARVEDILOL 6.25 MG PO TABS: 6.2500 mg | ORAL_TABLET | Freq: Two times a day (BID) | ORAL | 3 refills | Status: DC

## 2021-12-20 MED ORDER — LOSARTAN POTASSIUM 50 MG PO TABS: 50.0000 mg | ORAL_TABLET | Freq: Every day | ORAL | 3 refills | Status: DC

## 2021-12-20 NOTE — Discharge Summary (Signed)
Physician Discharge Summary  Ryan Hughes ZOX:[id number] DOB: May 19, 1965 DOA: 12/14/2021  PCP: Grayce Sessions, NP  Admit date: 12/14/2021 Discharge date: 12/20/2021  Admitted From: No Disposition:  None  Recommendations for Outpatient Follow-up:  Follow up with PCP in 1-2 weeks, titrate insulin as an outpatient as needed. Please obtain BMP/CBC in one week Follow-up with urology in 2 weeks discontinue Foley Follow-up with GI as an outpatient.   Home Health: Yes Equipment/Devices:None  Discharge Condition:Stable CODE STATUS:Full  Diet recommendation: Heart Healthy   Brief/Interim Summary: 56 y.o. male past medical history of CVA, residual aphasia and left hemiplegia, essential hypertension, poorly controlled diabetes mellitus with a last A1c of 14 presents to the ED with nausea vomiting, greater than 500 on admission  Discharge Diagnoses:  Principal Problem:   Nausea & vomiting Active Problems:   Intractable nausea and vomiting   HTN (hypertension)   Type 2 diabetes mellitus with hyperglycemia (HCC)   Hypokalemia   Hyperglycemic hyperosmolar nonketotic coma (HCC)  Hyperglycemic hyperosmolar state/diabetes mellitus type 2: He was started on IV insulin and his blood glucose improved his A1c was 14 likely due to noncompliance. He was transitioned to home regimen as blood glucose is improved. He will continue long-acting insulin metformin and low-dose glipizide was added he will follow-up with PCP in 2 to 4 weeks.  Intractable nausea and vomiting/GERD with esophagitis seen on CT: He was started on IV Protonix p.o. twice daily UDS was positive for cannabis question if this was contributing to his nausea and vomiting along with his hyper osmolar state. CT of the head showed no acute findings. CT scan of the abdomen pelvis showed distal esophageal thickening. Physical therapy evaluated the patient recommended home health PT. He will follow-up with GI in 2 to 4 weeks we will  continue Protonix p.o. twice daily he has no alarming symptoms.  Acute kidney injury on chronic kidney disease stage IIIa: Multifactorial in the setting of hyperglycemia, ACE inhibitor diuretic use and BPH. A Foley was inserted he was started on aggressive IV fluid hydration and his creatinine returned to baseline. Renal ultrasound showed no hydronephrosis medical renal disease.  Essential hypertension: Initially his antihypertensive medications were held as his blood pressure started to rise. He started on Cozaar and Coreg at the current thiazide was resumed his blood pressure was well controlled he will follow-up with PCP as an outpatient and titrate antihypertensive medications as tolerated.  Acute on chronic urinary retention due to BPH: CT scan showed enlarged prostate he was on Flomax at home, it was increased and Avodart was added. A Foley had to be placed as he was not able to urinate more than 1000 cc of urine came out. We will go with a Foley and follow-up with urology in 2 to 4 weeks  Hypokalemia: Replete orally now resolved.  Tobacco abuse: Counseling.  History of CVA in 2021: He came in taking aspirin and Plavix he was supposed to drop the Plavix 3 weeks postdischarge. We will continue aspirin 81 mg daily.     Discharge Instructions  Discharge Instructions     Diet - low sodium heart healthy   Complete by: As directed    Increase activity slowly   Complete by: As directed       Allergies as of 12/20/2021       Reactions   Pork-derived Products Other (See Comments)   Causes gout flares   Shellfish-derived Products Other (See Comments)   Causes gout flares  Medication List     STOP taking these medications    aspirin 325 MG tablet   lisinopril 20 MG tablet Commonly known as: ZESTRIL   losartan-hydrochlorothiazide 100-25 MG tablet Commonly known as: HYZAAR       TAKE these medications    atorvastatin 80 MG tablet Commonly known as:  LIPITOR Take 1 tablet (80 mg total) by mouth at bedtime.   carvedilol 6.25 MG tablet Commonly known as: COREG Take 1 tablet (6.25 mg total) by mouth 2 (two) times daily with a meal.   clopidogrel 75 MG tablet Commonly known as: PLAVIX Take 75 mg by mouth daily.   dutasteride 0.5 MG capsule Commonly known as: AVODART Take 1 capsule (0.5 mg total) by mouth daily.   folic acid 1 MG tablet Commonly known as: FOLVITE Take 1 mg by mouth daily.   glipiZIDE 5 MG tablet Commonly known as: GLUCOTROL Take 1 tablet (5 mg total) by mouth 2 (two) times daily.   hydrochlorothiazide 12.5 MG tablet Commonly known as: HYDRODIURIL Take 1 tablet (12.5 mg total) by mouth daily.   insulin glargine 100 UNIT/ML Solostar Pen Commonly known as: LANTUS Inject 15 Units into the skin 2 (two) times daily.   losartan 50 MG tablet Commonly known as: COZAAR Take 1 tablet (50 mg total) by mouth daily.   metFORMIN 1000 MG tablet Commonly known as: GLUCOPHAGE Take 1 tablet (1,000 mg total) by mouth 2 (two) times daily with a meal.   multivitamin with minerals Tabs tablet Take 1 tablet by mouth daily.   pantoprazole 20 MG tablet Commonly known as: PROTONIX Take 1 tablet (20 mg total) by mouth daily.   potassium chloride 10 MEQ tablet Commonly known as: KLOR-CON Take 2 tablets (20 mEq total) by mouth daily.   tamsulosin 0.4 MG Caps capsule Commonly known as: FLOMAX Take 2 capsules (0.8 mg total) by mouth daily.   thiamine 100 MG tablet Commonly known as: VITAMIN B1 Take 1 tablet (100 mg total) by mouth daily.   Trulicity [id number] Sopn Generic drug: Dulaglutide Inject 0.75 mg into the skin once a week.        Allergies  Allergen Reactions   Pork-Derived Products Other (See Comments)    Causes gout flares   Shellfish-Derived Products Other (See Comments)    Causes gout flares    Consultations: None   Procedures/Studies: DG Swallowing Func-Speech Pathology  Result Date:  12/17/2021 Table formatting from the original result was not included. Objective Swallowing Evaluation: Type of Study: MBS-Modified Barium Swallow Study  Patient Details Name: Ryan Hughes MRN: [id number] Date of Birth: October 14, 1965 Today's Date: 12/17/2021 Time: SLP Start Time (ACUTE ONLY): 1327 -SLP Stop Time (ACUTE ONLY): 1340 SLP Time Calculation (min) (ACUTE ONLY): 13 min Past Medical History: Past Medical History: Diagnosis Date  Diabetes mellitus without complication ([place])   Hypertension   Nicotine dependence, cigarettes, uncomplicated [id number] Past Surgical History: No past surgical history on file. HPI: Pt is a 56 y.o. male who presented on 12/14/2021 with nause and vomiting. Blood glucose found to be 340 in ED, pt also retaining 1L of urine and found to be severely hyperglucemic. CT suggested circumferential thickening of the distal esophagus, nodular infiltrate within the left lower lobe, new from prior examination, possibly related to infection or aspiration. GI consulted for possible EGD. SLP consulted due to pt coughing with p.o. intake on 8/10. PMH: CVA w/ L hemiplegia and aphasia, HTN, DMII.  No data recorded  Recommendations for follow up therapy are  one component of a multi-disciplinary discharge planning process, led by the attending physician.  Recommendations may be updated based on patient status, additional functional criteria and insurance authorization. Assessment / Plan / Recommendation   12/17/2021   1:37 PM Clinical Impressions Clinical Impression Pt presents with oropharyngeal dysphagia characterized by weak lingual manipualtion, reduced bolus cohesion, reduced pharyngeal constriction, and a pharyngeal delay. He demonstrated oral holding, lingual pumping, and premature spillage to the valleculae and pyriform sinuses. Pt exhibited grimacing whenever he tasted the barium, SLP suspects that at least some pt's oral holding was due to his distaste of the barium. Transient penetration (PAS 2) was  noted once with thin liquids secondary to the pharynegal delay, but penetration did not progress beyond this normal level and no instances of aspiration were noted. Pt swallowed a 71mm barium tablet with thin liquids once additional time was given for posterior bolus propulsion. No esophageal stasis was noted during esophageal sweep. It is recommended that pt's current diet of regular texture solids and thin liquids be continued. SLP will follow briefly. SLP Visit Diagnosis Dysphagia, oropharyngeal phase (R13.12) Impact on safety and function Mild aspiration risk     12/17/2021   1:37 PM Treatment Recommendations Treatment Recommendations Therapy as outlined in treatment plan below     12/17/2021   1:37 PM Prognosis Prognosis for Safe Diet Advancement Good   12/17/2021   1:37 PM Diet Recommendations SLP Diet Recommendations Regular solids;Thin liquid Liquid Administration via Straw;Cup Medication Administration Whole meds with puree Compensations Minimize environmental distractions;Slow rate;Small sips/bites Postural Changes Seated upright at 90 degrees     12/17/2021   1:37 PM Other Recommendations Oral Care Recommendations Oral care BID Follow Up Recommendations No SLP follow up Functional Status Assessment Patient has had a recent decline in their functional status and demonstrates the ability to make significant improvements in function in a reasonable and predictable amount of time.   12/17/2021   1:37 PM Frequency and Duration  Speech Therapy Frequency (ACUTE ONLY) min 1 x/week Treatment Duration 1 week     12/17/2021   1:37 PM Oral Phase Oral Phase Impaired Oral - Thin Cup Weak lingual manipulation;Lingual pumping;Holding of bolus;Premature spillage;Decreased bolus cohesion Oral - Thin Straw Weak lingual manipulation;Lingual pumping;Holding of bolus;Premature spillage;Decreased bolus cohesion Oral - Puree Weak lingual manipulation;Lingual pumping;Holding of bolus Oral - Regular Weak lingual manipulation;Lingual  pumping;Holding of bolus    12/17/2021   1:37 PM Pharyngeal Phase Pharyngeal Phase Impaired Pharyngeal- Thin Cup Delayed swallow initiation-pyriform sinuses;Penetration/Aspiration before swallow;Reduced pharyngeal peristalsis Pharyngeal Material enters airway, remains ABOVE vocal cords then ejected out Pharyngeal- Thin Straw Delayed swallow initiation-pyriform sinuses;Reduced pharyngeal peristalsis Pharyngeal- Puree Reduced pharyngeal peristalsis;Delayed swallow initiation-vallecula Pharyngeal- Regular Reduced pharyngeal peristalsis;Delayed swallow initiation-vallecula Pharyngeal- Pill Reduced pharyngeal peristalsis;Delayed swallow initiation-vallecula;Delayed swallow initiation-pyriform sinuses    12/17/2021   1:37 PM Cervical Esophageal Phase  Cervical Esophageal Phase [facility] Shanika [name], MS, CCC-SLP Acute Rehabilitation Services Office number [phone] [name] 12/17/2021, 2:38 PM                     US RENAL  Result Date: 12/17/2021 CLINICAL DATA:  Acute kidney injury EXAM: RENAL / URINARY TRACT ULTRASOUND COMPLETE COMPARISON:  Abdominal CT from 2 days ago FINDINGS: Right Kidney: Renal measurements: 11.8 x 7.4 x 6.2 cm = volume: 280 mL. No mass or hydronephrosis visualized. Increased cortical echogenicity with prominent corticomedullary differentiation. Left Kidney: Renal measurements: 11.3 x 6.8 x 6.1 cm = volume: 244 mL. No mass or  hydronephrosis visualized. Increased cortical echogenicity with prominent corticomedullary differentiation. Bladder: Prominent bladder wall thickness but collapsed around a Foley catheter. IMPRESSION: 1. Medical renal disease. 2. No hydronephrosis or renal atrophy. Electronically Signed   By: Jorje Guild M.D.   On: 12/17/2021 09:57   CT ABDOMEN PELVIS WO CONTRAST  Result Date: 12/15/2021 CLINICAL DATA:  Nausea/vomiting EXAM: CT ABDOMEN AND PELVIS WITHOUT CONTRAST TECHNIQUE: Multidetector CT imaging of the abdomen and pelvis was performed following the  standard protocol without IV contrast. RADIATION DOSE REDUCTION: This exam was performed according to the departmental dose-optimization program which includes automated exposure control, adjustment of the mA and/or kV according to patient size and/or use of iterative reconstruction technique. COMPARISON:  04/11/2021 FINDINGS: Lower chest: Nodular infiltrate within the left lower lobe is new from prior examination, possibly related to infection or aspiration in the acute setting. Stable 6 mm right lower lobe pulmonary nodule since prior examination of 03/17/2019, safely considered benign. Cervical Scholl thickening of the distal esophagus is again seen, possibly inflammatory as can be seen with esophagitis, or related to infiltrative disease. This is not well characterized on this examination. Hepatobiliary: No focal liver abnormality is seen. No gallstones, gallbladder wall thickening, or biliary dilatation. Pancreas: Unremarkable Spleen: Unremarkable Adrenals/Urinary Tract: The adrenal glands are unremarkable. The kidneys are normal on this noncontrast examination. The bladder is largely decompressed. The bladder wall appears mildly thickened, however, suggesting changes of chronic bladder outlet obstruction. No perivesicular inflammatory changes or fluid collections are identified. Stomach/Bowel: Stomach is within normal limits. Appendix appears normal. No evidence of bowel wall thickening, distention, or inflammatory changes. No free intraperitoneal gas or fluid. Vascular/Lymphatic: Mild aortoiliac atherosclerotic calcification. No aortic aneurysm. No pathologic adenopathy within the abdomen and pelvis. Reproductive: Moderate prostatic hypertrophy. Other: No abdominal wall hernia. Musculoskeletal: No acute bone abnormality. Degenerative changes are seen within the lumbar spine. IMPRESSION: 1. No acute intra-abdominal pathology identified. No definite radiographic explanation for the patient's reported symptoms.  2. Nodular infiltrate within the left lower lobe, new from prior examination, possibly related to infection or aspiration in the acute setting. 3. Circumferential thickening of the distal esophagus, possibly inflammatory as can be seen with esophagitis, or related to infiltrative disease. While similar to prior examination, this is not well characterized on this examination and correlation with endoscopy would be helpful for further evaluation. 4. Moderate prostatic hypertrophy. Mild bladder wall thickening may reflect changes of chronic bladder outlet obstruction. The bladder is decompressed. Electronically Signed   By: Fidela Salisbury M.D.   On: 12/15/2021 00:54   (Echo, Carotid, EGD, Colonoscopy, ERCP)    Subjective: No complaints  Discharge Exam: Vitals:   12/20/21 0025 12/20/21 0557  BP: 118/72 (!) 149/86  Pulse: 80 83  Resp: 18 18  Temp: 97.9 F (36.6 C) 99.2 F (37.3 C)  SpO2: 98% 99%   Vitals:   12/19/21 1605 12/19/21 2102 12/20/21 0025 12/20/21 0557  BP: (!) 134/96 (!) 145/78 118/72 (!) 149/86  Pulse: 82 (!) 57 80 83  Resp: 16 18 18 18   Temp: 99 F (37.2 C) 97.9 F (36.6 C) 97.9 F (36.6 C) 99.2 F (37.3 C)  TempSrc: Oral Oral Oral Oral  SpO2: 99% 99% 98% 99%  Weight:    80.4 kg  Height:        General: Pt is alert, awake, not in acute distress Cardiovascular: RRR, S1/S2 +, no rubs, no gallops Respiratory: CTA bilaterally, no wheezing, no rhonchi Abdominal: Soft, NT, ND, bowel sounds + Extremities: no  edema, no cyanosis    The results of significant diagnostics from this hospitalization (including imaging, microbiology, ancillary and laboratory) are listed below for reference.     Microbiology: No results found for this or any previous visit (from the past 240 hour(s)).   Labs: BNP (last 3 results) No results for input(s): "BNP" in the last 8760 hours. Basic Metabolic Panel: Recent Labs  Lab 12/15/21 0540 12/16/21 0356 12/17/21 0330 12/18/21 0244  12/19/21 0217  NA 145 139 136 139 140  K 3.3* 3.1* 3.2* 3.3* 3.8  CL 96* 101 99 105 107  CO2 35* 30 28 27 26   GLUCOSE 145* 98 128* 74 73  BUN 29* 24* 21* 12 11  CREATININE 1.55* 1.70* 1.68* 1.34* 1.34*  CALCIUM 9.3 8.9 8.6* 8.6* 9.0  MG  --  1.7  --   --   --    Liver Function Tests: Recent Labs  Lab 12/14/21 1247 12/15/21 0205  AST 14* 16  ALT 14 12  ALKPHOS 105 77  BILITOT 1.0 0.6  PROT 8.2* 6.8  ALBUMIN 4.4 3.5   Recent Labs  Lab 12/14/21 1247  LIPASE 20   No results for input(s): "AMMONIA" in the last 168 hours. CBC: Recent Labs  Lab 12/14/21 1247 12/15/21 0040 12/15/21 0205 12/16/21 0356  WBC 10.4  --  11.8* 13.8*  HGB 15.9 13.6 13.7 12.2*  HCT 47.4 40.0 39.8 36.6*  MCV 88.1  --  87.7 88.8  PLT 389  --  346 317   Cardiac Enzymes: No results for input(s): "CKTOTAL", "CKMB", "CKMBINDEX", "TROPONINI" in the last 168 hours. BNP: Invalid input(s): "POCBNP" CBG: Recent Labs  Lab 12/19/21 0821 12/19/21 1215 12/19/21 1608 12/19/21 2113 12/19/21 2140  GLUCAP 72 119* 172* 52* 167*   D-Dimer No results for input(s): "DDIMER" in the last 72 hours. Hgb A1c No results for input(s): "HGBA1C" in the last 72 hours. Lipid Profile No results for input(s): "CHOL", "HDL", "LDLCALC", "TRIG", "CHOLHDL", "LDLDIRECT" in the last 72 hours. Thyroid function studies No results for input(s): "TSH", "T4TOTAL", "T3FREE", "THYROIDAB" in the last 72 hours.  Invalid input(s): "FREET3" Anemia work up No results for input(s): "VITAMINB12", "FOLATE", "FERRITIN", "TIBC", "IRON", "RETICCTPCT" in the last 72 hours. Urinalysis    Component Value Date/Time   COLORURINE YELLOW 12/15/2021 0032   APPEARANCEUR CLEAR 12/15/2021 0032   LABSPEC 1.023 12/15/2021 0032   PHURINE 5.0 12/15/2021 0032   GLUCOSEU >=500 (A) 12/15/2021 0032   HGBUR NEGATIVE 12/15/2021 0032   BILIRUBINUR NEGATIVE 12/15/2021 0032   BILIRUBINUR negative 12/31/2020 1548   KETONESUR 20 (A) 12/15/2021 0032    PROTEINUR 100 (A) 12/15/2021 0032   UROBILINOGEN 0.2 12/31/2020 1548   UROBILINOGEN 0.2 07/04/2010 0959   NITRITE NEGATIVE 12/15/2021 0032   LEUKOCYTESUR NEGATIVE 12/15/2021 0032   Sepsis Labs Recent Labs  Lab 12/14/21 1247 12/15/21 0205 12/16/21 0356  WBC 10.4 11.8* 13.8*   Microbiology No results found for this or any previous visit (from the past 240 hour(s)).    SIGNED:   Charlynne Cousins, MD  [facility] 12/20/2021, 7:40 AM Pager   If 7PM-7AM, please contact night-coverage [email or web address] Password [email or web address]

## 2021-12-20 NOTE — Progress Notes (Signed)
Speech Language Pathology Treatment: Dysphagia  Patient Details Name: Ryan Hughes MRN: [id number] DOB: 1965-06-24 Today's Date: 12/20/2021 Time: 9381-8299 SLP Time Calculation (min) (ACUTE ONLY): 17 min  Assessment / Plan / Recommendation Clinical Impression  Pt seen for f/u dysphagia tx with intake of soft solids/thin liquids via cup with min-mod verbal cues provided for bite/sip size/volume d/t impulsivity noted during intake of POs.  Pt required multiple verbal cues to decrease bite size with eventual delayed cough noted (with larger bite size) by end of session.  Pt exhibited adequate mastication, but presents with decreased processing/response time when asked questions or following directives and this is subsequently mirrored with his swallowing function during intake.  Pt is able to consume regular/thin liquids with intermittent verbal cues for decreasing bite size/sip size without use of straw during intake.  Medications whole in puree for precautionary measure.  ST will s/o at this time as pt is able to consume regular/thin liquid diet with use of aspiration precautions/general swallowing strategies.     HPI HPI: Pt is a 56 y.o. male who presented on 12/14/2021 with nause and vomiting. Blood glucose found to be 340 in ED, pt also retaining 1L of urine and found to be severely hyperglycemic. CT suggested circumferential thickening of the distal esophagus, nodular infiltrate within the left lower lobe, new from prior examination, possibly related to infection or aspiration. GI consulted for possible EGD. SLP consulted due to pt coughing with p.o. intake on 8/10. PMH: CVA w/ L hemiplegia and aphasia, HTN, DMII; ST f/u for diet tolerance post MBS.      SLP Plan  All goals met;Discharge SLP treatment due to goals attained.      Recommendations for follow up therapy are one component of a multi-disciplinary discharge planning process, led by the attending physician.  Recommendations may be  updated based on patient status, additional functional criteria and insurance authorization.    Recommendations  Diet recommendations: Regular;Thin liquid Liquids provided via: Cup;No straw Medication Administration: Whole meds with puree Supervision: Patient able to self feed;Intermittent supervision to cue for compensatory strategies Compensations: Slow rate;Small sips/bites Postural Changes and/or Swallow Maneuvers: Seated upright 90 degrees;Upright 30-60 min after meal                Oral Care Recommendations: Oral care BID Follow Up Recommendations: No SLP follow up Assistance recommended at discharge: Intermittent Supervision/Assistance SLP Visit Diagnosis: Dysphagia, oropharyngeal phase (R13.12) Plan: All goals met;Discharge SLP treatment due to (comment)           Elvina Sidle, M.S., CCC-SLP  12/20/2021, 12:22 PM

## 2021-12-20 NOTE — Progress Notes (Signed)
Physical Therapy Treatment Patient Details Name: Ryan Hughes MRN: [id number] DOB: 1965-09-23 Today's Date: 12/20/2021   History of Present Illness 56 y.o. male presents to [facility] hospital on 12/14/2021 with nause and vomiting. Blood glucose found to be 340 in ED, pt also retaining 1L of urine. PMH includes CVA w/ L hemiplegia and aphasia, HTN, DMII.    PT Comments    Pt progressing with mobility. Agreeable to using RW and is much safer with this device, esp with increased distances where he begins to have L knee buckling. Pt with minimal ability to change pace with gait speed. Would benefit from HHPT to work on balance and functional mobility. After ambulation pt sat in chair and c/o R eye/ face discomfort and had noted L beating nystagmus. RN present and evaluating. Nystagmus ceased after several minutes. PT will continue to follow.    Recommendations for follow up therapy are one component of a multi-disciplinary discharge planning process, led by the attending physician.  Recommendations may be updated based on patient status, additional functional criteria and insurance authorization.  Follow Up Recommendations  Home health PT     Assistance Recommended at Discharge Frequent or constant Supervision/Assistance  Patient can return home with the following A little help with walking and/or transfers;A little help with bathing/dressing/bathroom;Assistance with cooking/housework;Direct supervision/assist for medications management;Direct supervision/assist for financial management;Assist for transportation   Equipment Recommendations  Rolling walker (2 wheels)    Recommendations for Other Services       Precautions / Restrictions Precautions Precautions: Fall Precaution Comments: hx of L hemiparesis, aphasia Restrictions Weight Bearing Restrictions: No     Mobility  Bed Mobility Overal bed mobility: Needs Assistance Bed Mobility: Supine to Sit     Supine to sit: Supervision      General bed mobility comments: pt able to come to EOB with increased time, no physical assist needed. Needed time EOB to gain balance before mobilizing OOB    Transfers Overall transfer level: Needs assistance Equipment used: Rolling walker (2 wheels) Transfers: Sit to/from Stand Sit to Stand: Supervision           General transfer comment: pt stood safely to RW and agreeable to using it at home    Ambulation/Gait Ambulation/Gait assistance: Supervision, Min guard Gait Distance (Feet): 120 Feet Assistive device: Rolling walker (2 wheels) Gait Pattern/deviations: Decreased stance time - left, Decreased step length - right, Staggering left, Staggering right Gait velocity: decreased Gait velocity interpretation: <1.8 ft/sec, indicate of risk for recurrent falls   General Gait Details: pt ambulated with supervision first 60', notable L lean and some giving way of L knee last 60', still no physical assist given but min guard for safety. Discussed the benefits of RW v SPC in that situation where increased distance makes him less safe   Stairs             Wheelchair Mobility    Modified Rankin (Stroke Patients Only)       Balance Overall balance assessment: Needs assistance Sitting-balance support: No upper extremity supported, Feet supported Sitting balance-Leahy Scale: Fair     Standing balance support: Single extremity supported, During functional activity, Reliant on assistive device for balance Standing balance-Leahy Scale: Poor Standing balance comment: much safer with UE support                            Cognition Arousal/Alertness: Awake/alert Behavior During Therapy: Flat affect Overall Cognitive Status: Difficult to assess  Following Commands: Follows one step commands with increased time, Follows one step commands inconsistently, Follows multi-step commands inconsistently, Follows multi-step commands with  increased time Safety/Judgement: Decreased awareness of safety Awareness: Emergent            Exercises      General Comments General comments (skin integrity, edema, etc.): worked on changing gait speed during ambulation, pt with minimal ability to do this. Of note, when pt sat down in chair he started rubbing his R sided face and had noted nystagmus. Called RN and she came to evaluate pt      Pertinent Vitals/Pain Pain Assessment Pain Assessment: No/denies pain    Home Living                          Prior Function            PT Goals (current goals can now be found in the care plan section) Acute Rehab PT Goals Patient Stated Goal: Return home PT Goal Formulation: With patient Time For Goal Achievement: 12/31/21 Potential to Achieve Goals: Good Progress towards PT goals: Progressing toward goals    Frequency    Min 3X/week      PT Plan Current plan remains appropriate    Co-evaluation              AM-PAC PT "6 Clicks" Mobility   Outcome Measure  Help needed turning from your back to your side while in a flat bed without using bedrails?: A Little Help needed moving from lying on your back to sitting on the side of a flat bed without using bedrails?: A Little Help needed moving to and from a bed to a chair (including a wheelchair)?: A Little Help needed standing up from a chair using your arms (e.g., wheelchair or bedside chair)?: A Little Help needed to walk in hospital room?: A Little Help needed climbing 3-5 steps with a railing? : A Lot 6 Click Score: 17    End of Session Equipment Utilized During Treatment: Gait belt Activity Tolerance: Patient tolerated treatment well Patient left: with call bell/phone within reach;in chair;with chair alarm set Nurse Communication: Mobility status PT Visit Diagnosis: Unsteadiness on feet (R26.81);Muscle weakness (generalized) (M62.81);Difficulty in walking, not elsewhere classified (R26.2);Other  abnormalities of gait and mobility (R26.89)     Time: 2947-6546 PT Time Calculation (min) (ACUTE ONLY): 29 min  Charges:  $Gait Training: 23-37 mins                     [name], PT  Acute Rehab Services Secure chat preferred Office [phone]    [name] 12/20/2021, 12:04 PM

## 2021-12-20 NOTE — Progress Notes (Signed)
Informed by OT that pt is rubbing his eye and complaining right eye being blurred. On assessment, pt said he feels he is blind from Rt eye but could count my fingers correctly. Denies any headache. VSS.

## 2021-12-20 NOTE — TOC Progression Note (Addendum)
Transition of Care [facility]) - Progression Note    Patient Details  Name: Ryan Hughes MRN: [id number] Date of Birth: [birth year]  [facility] CM/SW Contact  Delilah Shan, LCSWA Phone Number: 12/20/2021, 12:20 PM  Clinical Narrative:     Update- CSW provided patients niece with SNF bed offer with [facility]. Patients niece accepted. CSW spoke with [place] at [facility] who confirmed bed offer. Randa Lynn confirmed she will start insurance authorization for patient. CSW informed MD.  Update-12:53pm-CSW received callback from patients niece [name] who confirmed it is not safe for patient to return back home with his significant other, that patients significant other unable to take care of him at home. Patients niece request for CSW to fax patient out to other possible LTC SNF facility's in [place] and [address] CSW faxed out initial referral for possible LTC placement near [facility] and [place]. Patients niece gave CSW number to call her is [phone].CSW informed MD.  CSW called and LVM for patients niece [name]. CSW awaiting callback to confirm safe dc plan for patient. TOC will continue to follow and assist with patients dc planning needs.  Expected Discharge Plan: Skilled Nursing Facility Barriers to Discharge: Continued Medical Work up  Expected Discharge Plan and Services Expected Discharge Plan: Skilled Nursing Facility       Living arrangements for the past 2 months: Apartment Expected Discharge Date: 12/20/21                                     Social Determinants of Health (SDOH) Interventions    Readmission Risk Interventions     No data to display

## 2021-12-21 DIAGNOSIS — E876 Hypokalemia: Secondary | ICD-10-CM | POA: Diagnosis not present

## 2021-12-21 DIAGNOSIS — E1101 Type 2 diabetes mellitus with hyperosmolarity with coma: Secondary | ICD-10-CM | POA: Diagnosis not present

## 2021-12-21 DIAGNOSIS — R739 Hyperglycemia, unspecified: Secondary | ICD-10-CM | POA: Diagnosis not present

## 2021-12-21 DIAGNOSIS — R112 Nausea with vomiting, unspecified: Secondary | ICD-10-CM | POA: Diagnosis not present

## 2021-12-21 LAB — GLUCOSE, CAPILLARY
Glucose-Capillary: 94 mg/dL (ref 70–99)
Glucose-Capillary: 161 mg/dL — ABNORMAL HIGH (ref 70–99)
Glucose-Capillary: 133 mg/dL — ABNORMAL HIGH (ref 70–99)
Glucose-Capillary: 167 mg/dL — ABNORMAL HIGH (ref 70–99)

## 2021-12-21 NOTE — Progress Notes (Signed)
OT Cancellation Note  Patient Details Name: Ryan Hughes MRN: [id number] DOB: [birth year]   Cancelled Treatment:    Reason Eval/Treat Not Completed: Patient declined, no reason specified Declined any EOB/OOB attempts at this time  Lorre Munroe 12/21/2021, 11:04 AM

## 2021-12-21 NOTE — TOC Transition Note (Addendum)
Transition of Care [facility]) - CM/SW Discharge Note   Patient Details  Name: Ryan Hughes MRN: [id number] Date of Birth: [birth year]  Transition [facility] Care [facility]) CM/SW Contact:  Lynett Grimes Phone Number: 12/21/2021, 10:03 AM   Clinical Narrative:    [facility] will [facility] Anticipated DC date: 12/21/2021 Family notified: Pt niece Transport by: Pt niece   Per MD patient ready for DC to [facility]. RN to call report prior to discharge [phone]). RN, patient, patient's family, and facility notified of DC. Discharge Summary and FL2 sent to facility. DC packet on chart.   CSW will sign off for now as social work intervention is no longer needed. Please consult [place] again if new needs arise.       Barriers to Discharge: Continued Medical Work up   Patient Goals and CMS Choice        Discharge Placement                       Discharge Plan and Services                                     Social Determinants of Health (SDOH) Interventions     Readmission Risk Interventions     No data to display

## 2021-12-21 NOTE — Social Work (Signed)
[facility] has not given confirmation to send pt over. They are waiting on medicaid per admission staff. CSW will continue to follow.

## 2021-12-21 NOTE — Progress Notes (Signed)
Mobility Specialist Progress Note:   12/21/21 1030  Mobility  Activity Dangled on edge of bed  Level of Assistance Standby assist, set-up cues, supervision of patient - no hands on  Assistive Device None  Activity Response Tolerated fair  $Mobility charge 1 Mobility   Pt easily aroused, initially agreeable to mobility session. Once EOB, pt declined any OOB mobility. Pt not speaking much, nodding his head yes/no to majority of questions. Pt back in bed with all needs met, bed alarm on.   Nelta Numbers Acute Rehab Secure Chat or Office Phone: [phone]

## 2021-12-21 NOTE — Discharge Summary (Signed)
Physician Discharge Summary  Ryan Hughes [id number] DOB: November 12, 1965 DOA: 12/14/2021  PCP: [name], NP  Admit date: 12/14/2021 Discharge date: 12/21/2021  Admitted From: No Disposition:  None  Recommendations for Outpatient Follow-up:  Follow up with PCP in 1-2 weeks, titrate insulin as an outpatient as needed. Please obtain BMP/CBC in one week Follow-up with urology in 2 weeks discontinue Foley Follow-up with GI as an outpatient.   Home Health: Yes Equipment/Devices:None  Discharge Condition:Stable CODE STATUS:Full  Diet recommendation: Heart Healthy   Brief/Interim Summary: 56 y.o. male past medical history of CVA, residual aphasia and left hemiplegia, essential hypertension, poorly controlled diabetes mellitus with a last A1c of 14 presents to the ED with nausea vomiting, greater than 500 on admission  Discharge Diagnoses:  Principal Problem:   Nausea & vomiting Active Problems:   Intractable nausea and vomiting   HTN (hypertension)   Type 2 diabetes mellitus with hyperglycemia (HCC)   Hypokalemia   Hyperglycemic hyperosmolar nonketotic coma ([place])  Hyperglycemic hyperosmolar state/diabetes mellitus type 2: He was started on IV insulin and his blood glucose improved his A1c was 14 likely due to noncompliance. He was transitioned to home regimen as blood glucose is improved. He will continue long-acting insulin metformin and low-dose glipizide was added he will follow-up with PCP in 2 to 4 weeks.  Intractable nausea and vomiting/GERD with esophagitis seen on CT: He was started on IV Protonix p.o. twice daily UDS was positive for cannabis question if this was contributing to his nausea and vomiting along with his hyper osmolar state. CT of the head showed no acute findings. CT scan of the abdomen pelvis showed distal esophageal thickening. Physical therapy evaluated the patient recommended home health PT. He will follow-up with GI in 2 to 4 weeks we will  continue Protonix p.o. twice daily he has no alarming symptoms.  Acute kidney injury on chronic kidney disease stage IIIa: Multifactorial in the setting of hyperglycemia, ACE inhibitor diuretic use and BPH. A Foley was inserted he was started on aggressive IV fluid hydration and his creatinine returned to baseline. Renal ultrasound showed no hydronephrosis medical renal disease.  Essential hypertension: Initially his antihypertensive medications were held as his blood pressure started to rise. He started on Cozaar and Coreg at the current thiazide was resumed his blood pressure was well controlled he will follow-up with PCP as an outpatient and titrate antihypertensive medications as tolerated.  Acute on chronic urinary retention due to BPH: CT scan showed enlarged prostate he was on Flomax at home, it was increased and Avodart was added. A Foley had to be placed as he was not able to urinate more than 1000 cc of urine came out. We will go with a Foley and follow-up with urology in 2 to 4 weeks  Hypokalemia: Replete orally now resolved.  Tobacco abuse: Counseling.  History of CVA in 2021: He came in taking aspirin and Plavix he was supposed to drop the Plavix 3 weeks postdischarge. We will continue aspirin 81 mg daily.   Discharge Instructions  Discharge Instructions     Diet - low sodium heart healthy   Complete by: As directed    Increase activity slowly   Complete by: As directed       Allergies as of 12/21/2021       Reactions   Pork-derived Products Other (See Comments)   Causes gout flares   Shellfish-derived Products Other (See Comments)   Causes gout flares  Medication List     STOP taking these medications    aspirin 325 MG tablet   clopidogrel 75 MG tablet Commonly known as: PLAVIX   lisinopril 20 MG tablet Commonly known as: ZESTRIL   losartan-hydrochlorothiazide 100-25 MG tablet Commonly known as: HYZAAR       TAKE these medications     atorvastatin 80 MG tablet Commonly known as: LIPITOR Take 1 tablet (80 mg total) by mouth at bedtime.   carvedilol 6.25 MG tablet Commonly known as: COREG Take 1 tablet (6.25 mg total) by mouth 2 (two) times daily with a meal.   dutasteride 0.5 MG capsule Commonly known as: AVODART Take 1 capsule (0.5 mg total) by mouth daily.   folic acid 1 MG tablet Commonly known as: FOLVITE Take 1 mg by mouth daily.   glipiZIDE 5 MG tablet Commonly known as: GLUCOTROL Take 1 tablet (5 mg total) by mouth 2 (two) times daily.   hydrochlorothiazide 12.5 MG tablet Commonly known as: HYDRODIURIL Take 1 tablet (12.5 mg total) by mouth daily.   insulin glargine 100 UNIT/ML Solostar Pen Commonly known as: LANTUS Inject 15 Units into the skin 2 (two) times daily.   losartan 50 MG tablet Commonly known as: COZAAR Take 1 tablet (50 mg total) by mouth daily.   metFORMIN 1000 MG tablet Commonly known as: GLUCOPHAGE Take 1 tablet (1,000 mg total) by mouth 2 (two) times daily with a meal.   multivitamin with minerals Tabs tablet Take 1 tablet by mouth daily.   pantoprazole 40 MG tablet Commonly known as: PROTONIX Take 1 tablet (40 mg total) by mouth 2 (two) times daily. What changed:  medication strength how much to take when to take this   potassium chloride 10 MEQ tablet Commonly known as: KLOR-CON Take 2 tablets (20 mEq total) by mouth daily.   tamsulosin 0.4 MG Caps capsule Commonly known as: FLOMAX Take 2 capsules (0.8 mg total) by mouth daily.   thiamine 100 MG tablet Commonly known as: VITAMIN B1 Take 1 tablet (100 mg total) by mouth daily.   Trulicity 0.75 MG/0.5ML Sopn Generic drug: Dulaglutide Inject 0.75 mg into the skin once a week.               Durable Medical Equipment  (From admission, onward)           Start     Ordered   12/20/21 1209  For home use only DME Walker rolling  Once       Question Answer Comment  Walker: With 5 Inch Wheels    Patient needs a walker to treat with the following condition Balance disorder      12/20/21 1209            Follow-up Information     [facility]. Schedule an appointment as soon as possible for a visit in 1 week(s).   Why: Urology appointment is Thursday, December 30, 2021 at 12:45pm with Dr. Modena Slater. Contact information: [address] [place] [address] [place] [address] [phone]               Allergies  Allergen Reactions   Pork-Derived Products Other (See Comments)    Causes gout flares   Shellfish-Derived Products Other (See Comments)    Causes gout flares    Consultations: None   Procedures/Studies: DG Swallowing Func-Speech Pathology  Result Date: 12/17/2021 Table formatting from the original result was not included. Objective Swallowing Evaluation: Type of Study: MBS-Modified Barium Swallow  Study  Patient Details Name: Cashten Goods MRN: [id number] Date of Birth: [birth year] Today's Date: 12/17/2021 Time: SLP Start Time (ACUTE ONLY): 1327 -SLP Stop Time (ACUTE ONLY): 1340 SLP Time Calculation (min) (ACUTE ONLY): 13 min Past Medical History: Past Medical History: Diagnosis Date  Diabetes mellitus without complication ([place])   Hypertension   Nicotine dependence, cigarettes, uncomplicated [id number] Past Surgical History: No past surgical history on file. HPI: Pt is a 56 y.o. male who presented on 12/14/2021 with nause and vomiting. Blood glucose found to be 340 in ED, pt also retaining 1L of urine and found to be severely hyperglucemic. CT suggested circumferential thickening of the distal esophagus, nodular infiltrate within the left lower lobe, new from prior examination, possibly related to infection or aspiration. GI consulted for possible EGD. SLP consulted due to pt coughing with p.o. intake on 8/10. PMH: CVA w/ L hemiplegia and aphasia, HTN, DMII.  No data recorded  Recommendations for follow up therapy are one component of a  multi-disciplinary discharge planning process, led by the attending physician.  Recommendations may be updated based on patient status, additional functional criteria and insurance authorization. Assessment / Plan / Recommendation   12/17/2021   1:37 PM Clinical Impressions Clinical Impression Pt presents with oropharyngeal dysphagia characterized by weak lingual manipualtion, reduced bolus cohesion, reduced pharyngeal constriction, and a pharyngeal delay. He demonstrated oral holding, lingual pumping, and premature spillage to the valleculae and pyriform sinuses. Pt exhibited grimacing whenever he tasted the barium, SLP suspects that at least some pt's oral holding was due to his distaste of the barium. Transient penetration (PAS 2) was noted once with thin liquids secondary to the pharynegal delay, but penetration did not progress beyond this normal level and no instances of aspiration were noted. Pt swallowed a 20mm barium tablet with thin liquids once additional time was given for posterior bolus propulsion. No esophageal stasis was noted during esophageal sweep. It is recommended that pt's current diet of regular texture solids and thin liquids be continued. SLP will follow briefly. SLP Visit Diagnosis Dysphagia, oropharyngeal phase (R13.12) Impact on safety and function Mild aspiration risk     12/17/2021   1:37 PM Treatment Recommendations Treatment Recommendations Therapy as outlined in treatment plan below     12/17/2021   1:37 PM Prognosis Prognosis for Safe Diet Advancement Good   12/17/2021   1:37 PM Diet Recommendations SLP Diet Recommendations Regular solids;Thin liquid Liquid Administration via Straw;Cup Medication Administration Whole meds with puree Compensations Minimize environmental distractions;Slow rate;Small sips/bites Postural Changes Seated upright at 90 degrees     12/17/2021   1:37 PM Other Recommendations Oral Care Recommendations Oral care BID Follow Up Recommendations No SLP follow up  Functional Status Assessment Patient has had a recent decline in their functional status and demonstrates the ability to make significant improvements in function in a reasonable and predictable amount of time.   12/17/2021   1:37 PM Frequency and Duration  Speech Therapy Frequency (ACUTE ONLY) min 1 x/week Treatment Duration 1 week     12/17/2021   1:37 PM Oral Phase Oral Phase Impaired Oral - Thin Cup Weak lingual manipulation;Lingual pumping;Holding of bolus;Premature spillage;Decreased bolus cohesion Oral - Thin Straw Weak lingual manipulation;Lingual pumping;Holding of bolus;Premature spillage;Decreased bolus cohesion Oral - Puree Weak lingual manipulation;Lingual pumping;Holding of bolus Oral - Regular Weak lingual manipulation;Lingual pumping;Holding of bolus    12/17/2021   1:37 PM Pharyngeal Phase Pharyngeal Phase Impaired Pharyngeal- Thin Cup Delayed swallow initiation-pyriform sinuses;Penetration/Aspiration before swallow;Reduced pharyngeal peristalsis Pharyngeal Material  enters airway, remains ABOVE vocal cords then ejected out Pharyngeal- Thin Straw Delayed swallow initiation-pyriform sinuses;Reduced pharyngeal peristalsis Pharyngeal- Puree Reduced pharyngeal peristalsis;Delayed swallow initiation-vallecula Pharyngeal- Regular Reduced pharyngeal peristalsis;Delayed swallow initiation-vallecula Pharyngeal- Pill Reduced pharyngeal peristalsis;Delayed swallow initiation-vallecula;Delayed swallow initiation-pyriform sinuses    12/17/2021   1:37 PM Cervical Esophageal Phase  Cervical Esophageal Phase [facility] Shanika I. Hardin Negus, [place], [place] Office number [phone] Horton Marshall 12/17/2021, 2:38 PM                     US RENAL  Result Date: 12/17/2021 CLINICAL DATA:  Acute kidney injury EXAM: RENAL / URINARY TRACT ULTRASOUND COMPLETE COMPARISON:  Abdominal CT from 2 days ago FINDINGS: Right Kidney: Renal measurements: 11.8 x 7.4 x 6.2 cm = volume: 280 mL. No mass or  hydronephrosis visualized. Increased cortical echogenicity with prominent corticomedullary differentiation. Left Kidney: Renal measurements: 11.3 x 6.8 x 6.1 cm = volume: 244 mL. No mass or hydronephrosis visualized. Increased cortical echogenicity with prominent corticomedullary differentiation. Bladder: Prominent bladder wall thickness but collapsed around a Foley catheter. IMPRESSION: 1. Medical renal disease. 2. No hydronephrosis or renal atrophy. Electronically Signed   By: Jorje Guild M.D.   On: 12/17/2021 09:57   CT ABDOMEN PELVIS WO CONTRAST  Result Date: 12/15/2021 CLINICAL DATA:  Nausea/vomiting EXAM: CT ABDOMEN AND PELVIS WITHOUT CONTRAST TECHNIQUE: Multidetector CT imaging of the abdomen and pelvis was performed following the standard protocol without IV contrast. RADIATION DOSE REDUCTION: This exam was performed according to the departmental dose-optimization program which includes automated exposure control, adjustment of the mA and/or kV according to patient size and/or use of iterative reconstruction technique. COMPARISON:  04/11/2021 FINDINGS: Lower chest: Nodular infiltrate within the left lower lobe is new from prior examination, possibly related to infection or aspiration in the acute setting. Stable 6 mm right lower lobe pulmonary nodule since prior examination of 03/17/2019, safely considered benign. Cervical Scholl thickening of the distal esophagus is again seen, possibly inflammatory as can be seen with esophagitis, or related to infiltrative disease. This is not well characterized on this examination. Hepatobiliary: No focal liver abnormality is seen. No gallstones, gallbladder wall thickening, or biliary dilatation. Pancreas: Unremarkable Spleen: Unremarkable Adrenals/Urinary Tract: The adrenal glands are unremarkable. The kidneys are normal on this noncontrast examination. The bladder is largely decompressed. The bladder wall appears mildly thickened, however, suggesting changes of  chronic bladder outlet obstruction. No perivesicular inflammatory changes or fluid collections are identified. Stomach/Bowel: Stomach is within normal limits. Appendix appears normal. No evidence of bowel wall thickening, distention, or inflammatory changes. No free intraperitoneal gas or fluid. Vascular/Lymphatic: Mild aortoiliac atherosclerotic calcification. No aortic aneurysm. No pathologic adenopathy within the abdomen and pelvis. Reproductive: Moderate prostatic hypertrophy. Other: No abdominal wall hernia. Musculoskeletal: No acute bone abnormality. Degenerative changes are seen within the lumbar spine. IMPRESSION: 1. No acute intra-abdominal pathology identified. No definite radiographic explanation for the patient's reported symptoms. 2. Nodular infiltrate within the left lower lobe, new from prior examination, possibly related to infection or aspiration in the acute setting. 3. Circumferential thickening of the distal esophagus, possibly inflammatory as can be seen with esophagitis, or related to infiltrative disease. While similar to prior examination, this is not well characterized on this examination and correlation with endoscopy would be helpful for further evaluation. 4. Moderate prostatic hypertrophy. Mild bladder wall thickening may reflect changes of chronic bladder outlet obstruction. The bladder is decompressed. Electronically Signed   By: Fidela Salisbury M.D.   On:  12/15/2021 00:54   (Echo, Carotid, EGD, Colonoscopy, ERCP)    Subjective: No complaints  Discharge Exam: Vitals:   12/21/21 0439 12/21/21 0752  BP: (!) 166/75 130/86  Pulse: 80 83  Resp: 18 17  Temp: 98.5 F (36.9 C) 98.1 F (36.7 C)  SpO2: 98% 100%   Vitals:   12/20/21 2006 12/20/21 2026 12/21/21 0439 12/21/21 0752  BP: 133/80 133/75 (!) 166/75 130/86  Pulse: 83 81 80 83  Resp: 18 17 18 17   Temp: 98.3 F (36.8 C) 98.8 F (37.1 C) 98.5 F (36.9 C) 98.1 F (36.7 C)  TempSrc: Oral Oral Oral Oral  SpO2: 99%  98% 98% 100%  Weight:  82.4 kg    Height:        General: Pt is alert, awake, not in acute distress Cardiovascular: RRR, S1/S2 +, no rubs, no gallops Respiratory: CTA bilaterally, no wheezing, no rhonchi Abdominal: Soft, NT, ND, bowel sounds + Extremities: no edema, no cyanosis    The results of significant diagnostics from this hospitalization (including imaging, microbiology, ancillary and laboratory) are listed below for reference.     Microbiology: No results found for this or any previous visit (from the past 240 hour(s)).   Labs: BNP (last 3 results) No results for input(s): "BNP" in the last 8760 hours. Basic Metabolic Panel: Recent Labs  Lab 12/16/21 0356 12/17/21 0330 12/18/21 0244 12/19/21 0217 12/20/21 0722  NA 139 136 139 140 138  K 3.1* 3.2* 3.3* 3.8 4.0  CL 101 99 105 107 103  CO2 30 28 27 26 25   GLUCOSE 98 128* 74 73 169*  BUN 24* 21* 12 11 16   CREATININE 1.70* 1.68* 1.34* 1.34* 1.65*  CALCIUM 8.9 8.6* 8.6* 9.0 9.0  MG 1.7  --   --   --   --     Liver Function Tests: Recent Labs  Lab 12/14/21 1247 12/15/21 0205  AST 14* 16  ALT 14 12  ALKPHOS 105 77  BILITOT 1.0 0.6  PROT 8.2* 6.8  ALBUMIN 4.4 3.5    Recent Labs  Lab 12/14/21 1247  LIPASE 20    No results for input(s): "AMMONIA" in the last 168 hours. CBC: Recent Labs  Lab 12/14/21 1247 12/15/21 0040 12/15/21 0205 12/16/21 0356  WBC 10.4  --  11.8* 13.8*  HGB 15.9 13.6 13.7 12.2*  HCT 47.4 40.0 39.8 36.6*  MCV 88.1  --  87.7 88.8  PLT 389  --  346 317    Cardiac Enzymes: No results for input(s): "CKTOTAL", "CKMB", "CKMBINDEX", "TROPONINI" in the last 168 hours. BNP: Invalid input(s): "POCBNP" CBG: Recent Labs  Lab 12/20/21 0802 12/20/21 1147 12/20/21 1714 12/20/21 2032 12/21/21 0754  GLUCAP 157* 218* 127* 154* 94    D-Dimer No results for input(s): "DDIMER" in the last 72 hours. Hgb A1c No results for input(s): "HGBA1C" in the last 72 hours. Lipid Profile No  results for input(s): "CHOL", "HDL", "LDLCALC", "TRIG", "CHOLHDL", "LDLDIRECT" in the last 72 hours. Thyroid function studies No results for input(s): "TSH", "T4TOTAL", "T3FREE", "THYROIDAB" in the last 72 hours.  Invalid input(s): "FREET3" Anemia work up No results for input(s): "VITAMINB12", "FOLATE", "FERRITIN", "TIBC", "IRON", "RETICCTPCT" in the last 72 hours. Urinalysis    Component Value Date/Time   COLORURINE YELLOW 12/15/2021 0032   APPEARANCEUR CLEAR 12/15/2021 0032   LABSPEC 1.023 12/15/2021 0032   PHURINE 5.0 12/15/2021 0032   GLUCOSEU >=500 (A) 12/15/2021 0032   HGBUR NEGATIVE 12/15/2021 0032   BILIRUBINUR NEGATIVE 12/15/2021  0032   BILIRUBINUR negative 12/31/2020 1548   KETONESUR 20 (A) 12/15/2021 0032   PROTEINUR 100 (A) 12/15/2021 0032   UROBILINOGEN 0.2 12/31/2020 1548   UROBILINOGEN 0.2 07/04/2010 0959   NITRITE NEGATIVE 12/15/2021 0032   LEUKOCYTESUR NEGATIVE 12/15/2021 0032   Sepsis Labs Recent Labs  Lab 12/14/21 1247 12/15/21 0205 12/16/21 0356  WBC 10.4 11.8* 13.8*    Microbiology No results found for this or any previous visit (from the past 240 hour(s)).    SIGNED:   Charlynne Cousins, MD  [facility] 12/21/2021, 9:15 AM Pager   If 7PM-7AM, please contact night-coverage [email or web address] Password [email or web address]

## 2021-12-22 LAB — GLUCOSE, CAPILLARY
Glucose-Capillary: 131 mg/dL — ABNORMAL HIGH (ref 70–99)
Glucose-Capillary: 289 mg/dL — ABNORMAL HIGH (ref 70–99)

## 2021-12-22 NOTE — Progress Notes (Signed)
Initial Nutrition Assessment  DOCUMENTATION CODES:  Not applicable  INTERVENTION:  Continue current diet as ordered Encourage PO intake Nursing to assist with tray set-up Discontinue glucerna  NUTRITION DIAGNOSIS:  Inadequate oral intake related to nausea as evidenced by per patient/family report.  GOAL:  Patient will meet greater than or equal to 90% of their needs  MONITOR:   PO intake, Weight trends  REASON FOR ASSESSMENT:   Malnutrition Screening Tool    ASSESSMENT:   Pt with history of stroke with left-sided hemiplegia and aphasia, HTN, and uncontrolled DM type 2 presented to ED with nausea and vomiting at home. Found to be extrememly hyperglycemia and imaging showed thickening of the esophagus  8/11 - MBS, SLP Diet Recommendations: Regular solids;Thin liquid   Pt resting in bed at the time of visit eating lunch. Did not answer any nutrition questions, would nod or shake his head to some questions.  Some muscle and fat deficits present on exam. Does not like glucerna.   Pt has been stable for dc for several days, pending insurance authorization for dc to SNF.   Average Meal Intake: 8/10-8/14: 76% intake x 5 recorded meals  Nutritionally Relevant Medications: Scheduled Meds:  atorvastatin  80 mg Oral QHS   dutasteride  0.5 mg Oral Daily   GLUCERNA SHAKE  237 mL Oral TID BM   hydrochlorothiazide  12.5 mg Oral Daily   insulin aspart  0-15 Units Subcutaneous TID WC   insulin aspart  0-5 Units Subcutaneous QHS   insulin aspart  2 Units Subcutaneous TID WC   insulin glargine-yfgn  10 Units Subcutaneous BID   pantoprazole  40 mg Oral BID   Labs Reviewed  NUTRITION - FOCUSED PHYSICAL EXAM: Flowsheet Row Most Recent Value  Orbital Region No depletion  Upper Arm Region Mild depletion  Thoracic and Lumbar Region Mild depletion  Buccal Region No depletion  Temple Region No depletion  Clavicle Bone Region Mild depletion  Clavicle and Acromion Bone Region Mild  depletion  Scapular Bone Region No depletion  Dorsal Hand No depletion  Patellar Region Severe depletion  Anterior Thigh Region Severe depletion  Posterior Calf Region Severe depletion  Edema (RD Assessment) None  Hair Reviewed  Eyes Reviewed  Mouth Reviewed  Skin Reviewed  Nails Reviewed    Diet Order:   Diet Order             Diet - low sodium heart healthy           Diet - low sodium heart healthy           Diet heart healthy/carb modified Room service appropriate? Yes; Fluid consistency: Thin  Diet effective now                   EDUCATION NEEDS:   No education needs have been identified at this time  Skin:  Skin Assessment: Reviewed RN Assessment  Last BM:  8/14  Height:   Ht Readings from Last 1 Encounters:  12/17/21 5\' 7"  (1.702 m)    Weight:   Wt Readings from Last 1 Encounters:  12/22/21 79.8 kg    Ideal Body Weight:  67.3 kg  BMI:  Body mass index is 27.55 kg/m.  Estimated Nutritional Needs:  Kcal:  2000-2200 kcal/d Protein:  100-120 g/d Fluid:  >/=2L/d    12/24/21, RD, LDN Clinical Dietitian RD pager # available in AMION  After hours/weekend pager # available in [facility]

## 2021-12-22 NOTE — Progress Notes (Signed)
Pts vitals were attempted but Pt would not stay still. Pt also requested to continue to sleep. Jocelyn RN notified.

## 2021-12-22 NOTE — Discharge Summary (Addendum)
Physician Discharge Summary  Ryan Hughes [id number] DOB: [birth year] DOA: 12/14/2021  PCP: [name], NP  Patient seen examined and reviewed at bedside--no new issues--patient stabilized for d/c tpo SNF  Admit date: 12/14/2021 Discharge date: 12/22/2021  Admitted From: No Disposition:  None  Recommendations for Outpatient Follow-up:  Follow up with PCP in 1-2 weeks, titrate insulin as an outpatient as needed. Please obtain BMP/CBC in one week Follow-up with urology in 2 weeks discontinue Foley Follow-up with GI as an outpatient--consider EGD  Home Health: Yes Equipment/Devices:None  Discharge Condition:Stable CODE STATUS:Full  Diet recommendation: Heart Healthy   Brief/Interim Summary: 56 y.o. male past medical history of CVA, residual aphasia and left hemiplegia, essential hypertension, poorly controlled diabetes mellitus with a last A1c of 14 presents to the ED with nausea vomiting, greater than 500 on admission  Discharge Diagnoses:  Principal Problem:   Nausea & vomiting Active Problems:   Intractable nausea and vomiting   HTN (hypertension)   Type 2 diabetes mellitus with hyperglycemia (HCC)   Hypokalemia   Hyperglycemic hyperosmolar nonketotic coma ([place])  Hyperglycemic hyperosmolar state/diabetes mellitus type 2: He was started on IV insulin and his blood glucose improved his A1c was 14 likely due to noncompliance. He was transitioned to home regimen as blood glucose is improved. He will continue long-acting insulin metformin and low-dose glipizide was added he will follow-up with PCP in 2 to 4 weeks.  Intractable nausea and vomiting/GERD with esophagitis seen on CT: He was started on IV Protonix p.o. twice daily UDS was positive for cannabis question if this was contributing to his nausea and vomiting along with his hyper osmolar state. CT of the head showed no acute findings. CT scan of the abdomen pelvis showed distal esophageal thickening. Physical  therapy evaluated the patient recommended home health PT. He will follow-up with GI in 2 to 4 weeks we will continue Protonix p.o. twice daily he has no alarming symptoms.  Acute kidney injury on chronic kidney disease stage IIIa: Multifactorial in the setting of hyperglycemia, ACE inhibitor diuretic use and BPH. A Foley was inserted he was started on aggressive IV fluid hydration and his creatinine returned to baseline. Renal ultrasound showed no hydronephrosis medical renal disease.  Essential hypertension: Initially his antihypertensive medications were held as his blood pressure started to rise. He started on Cozaar and Coreg at the current thiazide was resumed his blood pressure was well controlled he will follow-up with PCP as an outpatient and titrate antihypertensive medications as tolerated.  Acute on chronic urinary retention due to BPH: CT scan showed enlarged prostate he was on Flomax at home, it was increased and Avodart was added. A Foley had to be placed as he was not able to urinate more than 1000 cc of urine came out. We will go with a Foley and follow-up with urology in 2 to 4 weeks  Hypokalemia: Replete orally now resolved.  Tobacco abuse: Counseling.  History of CVA in 2021: He came in taking aspirin and Plavix he was supposed to drop the Plavix 3 weeks postdischarge. We will continue aspirin 81 mg daily.   Discharge Instructions  Discharge Instructions     Diet - low sodium heart healthy   Complete by: As directed    Diet - low sodium heart healthy   Complete by: As directed    Increase activity slowly   Complete by: As directed    Increase activity slowly   Complete by: As directed  Allergies as of 12/22/2021       Reactions   Pork-derived Products Other (See Comments)   Causes gout flares   Shellfish-derived Products Other (See Comments)   Causes gout flares        Medication List     STOP taking these medications    aspirin 325 MG  tablet   clopidogrel 75 MG tablet Commonly known as: PLAVIX   lisinopril 20 MG tablet Commonly known as: ZESTRIL   losartan-hydrochlorothiazide 100-25 MG tablet Commonly known as: HYZAAR       TAKE these medications    atorvastatin 80 MG tablet Commonly known as: LIPITOR Take 1 tablet (80 mg total) by mouth at bedtime.   carvedilol 6.25 MG tablet Commonly known as: COREG Take 1 tablet (6.25 mg total) by mouth 2 (two) times daily with a meal.   dutasteride 0.5 MG capsule Commonly known as: AVODART Take 1 capsule (0.5 mg total) by mouth daily.   folic acid 1 MG tablet Commonly known as: FOLVITE Take 1 mg by mouth daily.   glipiZIDE 5 MG tablet Commonly known as: GLUCOTROL Take 1 tablet (5 mg total) by mouth 2 (two) times daily.   hydrochlorothiazide 12.5 MG tablet Commonly known as: HYDRODIURIL Take 1 tablet (12.5 mg total) by mouth daily.   insulin glargine 100 UNIT/ML Solostar Pen Commonly known as: LANTUS Inject 15 Units into the skin 2 (two) times daily.   losartan 50 MG tablet Commonly known as: COZAAR Take 1 tablet (50 mg total) by mouth daily.   metFORMIN 1000 MG tablet Commonly known as: GLUCOPHAGE Take 1 tablet (1,000 mg total) by mouth 2 (two) times daily with a meal.   multivitamin with minerals Tabs tablet Take 1 tablet by mouth daily.   pantoprazole 40 MG tablet Commonly known as: PROTONIX Take 1 tablet (40 mg total) by mouth 2 (two) times daily. What changed:  medication strength how much to take when to take this   potassium chloride 10 MEQ tablet Commonly known as: KLOR-CON Take 2 tablets (20 mEq total) by mouth daily.   tamsulosin 0.4 MG Caps capsule Commonly known as: FLOMAX Take 2 capsules (0.8 mg total) by mouth daily.   thiamine 100 MG tablet Commonly known as: VITAMIN B1 Take 1 tablet (100 mg total) by mouth daily.   Trulicity [id number] Sopn Generic drug: Dulaglutide Inject 0.75 mg into the skin once a week.                Durable Medical Equipment  (From admission, onward)           Start     Ordered   12/20/21 1209  For home use only DME Walker rolling  Once       Question Answer Comment  Walker: With 5 Inch Wheels   Patient needs a walker to treat with the following condition Balance disorder      12/20/21 1209            Follow-up Information     [facility]. Schedule an appointment as soon as possible for a visit in 1 week(s).   Why: Urology appointment is Thursday, December 30, 2021 at 12:45pm with Dr. Link Snuffer. Contact information: [place] [phone]               Allergies  Allergen Reactions   Pork-Derived Products Other (See Comments)    Causes gout flares   Shellfish-Derived Products Other (  See Comments)    Causes gout flares    Consultations: None   Procedures/Studies: DG Swallowing Func-Speech Pathology  Result Date: 12/17/2021 Table formatting from the original result was not included. Objective Swallowing Evaluation: Type of Study: MBS-Modified Barium Swallow Study  Patient Details Name: Ryan Hughes MRN: [id number] Date of Birth: Jun 25, 1965 Today's Date: 12/17/2021 Time: SLP Start Time (ACUTE ONLY): 1327 -SLP Stop Time (ACUTE ONLY): 1340 SLP Time Calculation (min) (ACUTE ONLY): 13 min Past Medical History: Past Medical History: Diagnosis Date  Diabetes mellitus without complication ([place])   Hypertension   Nicotine dependence, cigarettes, uncomplicated [id number] Past Surgical History: No past surgical history on file. HPI: Pt is a 56 y.o. male who presented on 12/14/2021 with nause and vomiting. Blood glucose found to be 340 in ED, pt also retaining 1L of urine and found to be severely hyperglucemic. CT suggested circumferential thickening of the distal esophagus, nodular infiltrate within the left lower lobe, new from prior examination, possibly related to infection or aspiration. GI  consulted for possible EGD. SLP consulted due to pt coughing with p.o. intake on 8/10. PMH: CVA w/ L hemiplegia and aphasia, HTN, DMII.  No data recorded  Recommendations for follow up therapy are one component of a multi-disciplinary discharge planning process, led by the attending physician.  Recommendations may be updated based on patient status, additional functional criteria and insurance authorization. Assessment / Plan / Recommendation   12/17/2021   1:37 PM Clinical Impressions Clinical Impression Pt presents with oropharyngeal dysphagia characterized by weak lingual manipualtion, reduced bolus cohesion, reduced pharyngeal constriction, and a pharyngeal delay. He demonstrated oral holding, lingual pumping, and premature spillage to the valleculae and pyriform sinuses. Pt exhibited grimacing whenever he tasted the barium, SLP suspects that at least some pt's oral holding was due to his distaste of the barium. Transient penetration (PAS 2) was noted once with thin liquids secondary to the pharynegal delay, but penetration did not progress beyond this normal level and no instances of aspiration were noted. Pt swallowed a 84mm barium tablet with thin liquids once additional time was given for posterior bolus propulsion. No esophageal stasis was noted during esophageal sweep. It is recommended that pt's current diet of regular texture solids and thin liquids be continued. SLP will follow briefly. SLP Visit Diagnosis Dysphagia, oropharyngeal phase (R13.12) Impact on safety and function Mild aspiration risk     12/17/2021   1:37 PM Treatment Recommendations Treatment Recommendations Therapy as outlined in treatment plan below     12/17/2021   1:37 PM Prognosis Prognosis for Safe Diet Advancement Good   12/17/2021   1:37 PM Diet Recommendations SLP Diet Recommendations Regular solids;Thin liquid Liquid Administration via Straw;Cup Medication Administration Whole meds with puree Compensations Minimize environmental  distractions;Slow rate;Small sips/bites Postural Changes Seated upright at 90 degrees     12/17/2021   1:37 PM Other Recommendations Oral Care Recommendations Oral care BID Follow Up Recommendations No SLP follow up Functional Status Assessment Patient has had a recent decline in their functional status and demonstrates the ability to make significant improvements in function in a reasonable and predictable amount of time.   12/17/2021   1:37 PM Frequency and Duration  Speech Therapy Frequency (ACUTE ONLY) min 1 x/week Treatment Duration 1 week     12/17/2021   1:37 PM Oral Phase Oral Phase Impaired Oral - Thin Cup Weak lingual manipulation;Lingual pumping;Holding of bolus;Premature spillage;Decreased bolus cohesion Oral - Thin Straw Weak lingual manipulation;Lingual pumping;Holding of bolus;Premature spillage;Decreased bolus cohesion Oral -  Puree Weak lingual manipulation;Lingual pumping;Holding of bolus Oral - Regular Weak lingual manipulation;Lingual pumping;Holding of bolus    12/17/2021   1:37 PM Pharyngeal Phase Pharyngeal Phase Impaired Pharyngeal- Thin Cup Delayed swallow initiation-pyriform sinuses;Penetration/Aspiration before swallow;Reduced pharyngeal peristalsis Pharyngeal Material enters airway, remains ABOVE vocal cords then ejected out Pharyngeal- Thin Straw Delayed swallow initiation-pyriform sinuses;Reduced pharyngeal peristalsis Pharyngeal- Puree Reduced pharyngeal peristalsis;Delayed swallow initiation-vallecula Pharyngeal- Regular Reduced pharyngeal peristalsis;Delayed swallow initiation-vallecula Pharyngeal- Pill Reduced pharyngeal peristalsis;Delayed swallow initiation-vallecula;Delayed swallow initiation-pyriform sinuses    12/17/2021   1:37 PM Cervical Esophageal Phase  Cervical Esophageal Phase [facility] Shanika [name], MS, CCC-SLP Acute Rehabilitation Services Office number [phone] [name] 12/17/2021, 2:38 PM                     US RENAL  Result Date: 12/17/2021 CLINICAL DATA:   Acute kidney injury EXAM: RENAL / URINARY TRACT ULTRASOUND COMPLETE COMPARISON:  Abdominal CT from 2 days ago FINDINGS: Right Kidney: Renal measurements: 11.8 x 7.4 x 6.2 cm = volume: 280 mL. No mass or hydronephrosis visualized. Increased cortical echogenicity with prominent corticomedullary differentiation. Left Kidney: Renal measurements: 11.3 x 6.8 x 6.1 cm = volume: 244 mL. No mass or hydronephrosis visualized. Increased cortical echogenicity with prominent corticomedullary differentiation. Bladder: Prominent bladder wall thickness but collapsed around a Foley catheter. IMPRESSION: 1. Medical renal disease. 2. No hydronephrosis or renal atrophy. Electronically Signed   By: Tiburcio Pea M.D.   On: 12/17/2021 09:57   CT ABDOMEN PELVIS WO CONTRAST  Result Date: 12/15/2021 CLINICAL DATA:  Nausea/vomiting EXAM: CT ABDOMEN AND PELVIS WITHOUT CONTRAST TECHNIQUE: Multidetector CT imaging of the abdomen and pelvis was performed following the standard protocol without IV contrast. RADIATION DOSE REDUCTION: This exam was performed according to the departmental dose-optimization program which includes automated exposure control, adjustment of the mA and/or kV according to patient size and/or use of iterative reconstruction technique. COMPARISON:  04/11/2021 FINDINGS: Lower chest: Nodular infiltrate within the left lower lobe is new from prior examination, possibly related to infection or aspiration in the acute setting. Stable 6 mm right lower lobe pulmonary nodule since prior examination of 03/17/2019, safely considered benign. Cervical Scholl thickening of the distal esophagus is again seen, possibly inflammatory as can be seen with esophagitis, or related to infiltrative disease. This is not well characterized on this examination. Hepatobiliary: No focal liver abnormality is seen. No gallstones, gallbladder wall thickening, or biliary dilatation. Pancreas: Unremarkable Spleen: Unremarkable Adrenals/Urinary Tract:  The adrenal glands are unremarkable. The kidneys are normal on this noncontrast examination. The bladder is largely decompressed. The bladder wall appears mildly thickened, however, suggesting changes of chronic bladder outlet obstruction. No perivesicular inflammatory changes or fluid collections are identified. Stomach/Bowel: Stomach is within normal limits. Appendix appears normal. No evidence of bowel wall thickening, distention, or inflammatory changes. No free intraperitoneal gas or fluid. Vascular/Lymphatic: Mild aortoiliac atherosclerotic calcification. No aortic aneurysm. No pathologic adenopathy within the abdomen and pelvis. Reproductive: Moderate prostatic hypertrophy. Other: No abdominal wall hernia. Musculoskeletal: No acute bone abnormality. Degenerative changes are seen within the lumbar spine. IMPRESSION: 1. No acute intra-abdominal pathology identified. No definite radiographic explanation for the patient's reported symptoms. 2. Nodular infiltrate within the left lower lobe, new from prior examination, possibly related to infection or aspiration in the acute setting. 3. Circumferential thickening of the distal esophagus, possibly inflammatory as can be seen with esophagitis, or related to infiltrative disease. While similar to prior examination, this is not well characterized on this examination  and correlation with endoscopy would be helpful for further evaluation. 4. Moderate prostatic hypertrophy. Mild bladder wall thickening may reflect changes of chronic bladder outlet obstruction. The bladder is decompressed. Electronically Signed   By: Helyn Numbers M.D.   On: 12/15/2021 00:54   (Echo, Carotid, EGD, Colonoscopy, ERCP)    Subjective: No complaints  Discharge Exam: Vitals:   12/21/21 2109 12/22/21 0809  BP: (!) 143/76 (!) 119/92  Pulse: 84 78  Resp: 16 17  Temp:  98 F (36.7 C)  SpO2: 98% 100%   Vitals:   12/21/21 1731 12/21/21 2109 12/22/21 0500 12/22/21 0809  BP: 135/75  (!) 143/76  (!) 119/92  Pulse: 82 84  78  Resp: 17 16  17   Temp: 98.5 F (36.9 C) 97.6 F (36.4 C)  98 F (36.7 C)  TempSrc: Oral     SpO2: 100% 98%  100%  Weight:   79.8 kg   Height:        General: Pt is alert, awake, not in acute distress Cardiovascular: RRR, S1/S2 +, no rubs, no gallops Respiratory: CTA bilaterally, no wheezing, no rhonchi Abdominal: Soft, NT, ND, bowel sounds + Extremities: no edema, no cyanosis    The results of significant diagnostics from this hospitalization (including imaging, microbiology, ancillary and laboratory) are listed below for reference.     Microbiology: No results found for this or any previous visit (from the past 240 hour(s)).   Labs: BNP (last 3 results) No results for input(s): "BNP" in the last 8760 hours. Basic Metabolic Panel: Recent Labs  Lab 12/16/21 0356 12/17/21 0330 12/18/21 0244 12/19/21 0217 12/20/21 0722  NA 139 136 139 140 138  K 3.1* 3.2* 3.3* 3.8 4.0  CL 101 99 105 107 103  CO2 30 28 27 26 25   GLUCOSE 98 128* 74 73 169*  BUN 24* 21* 12 11 16   CREATININE 1.70* 1.68* 1.34* 1.34* 1.65*  CALCIUM 8.9 8.6* 8.6* 9.0 9.0  MG 1.7  --   --   --   --     Liver Function Tests: No results for input(s): "AST", "ALT", "ALKPHOS", "BILITOT", "PROT", "ALBUMIN" in the last 168 hours.  No results for input(s): "LIPASE", "AMYLASE" in the last 168 hours.  No results for input(s): "AMMONIA" in the last 168 hours. CBC: Recent Labs  Lab 12/16/21 0356  WBC 13.8*  HGB 12.2*  HCT 36.6*  MCV 88.8  PLT 317    Cardiac Enzymes: No results for input(s): "CKTOTAL", "CKMB", "CKMBINDEX", "TROPONINI" in the last 168 hours. BNP: Invalid input(s): "POCBNP" CBG: Recent Labs  Lab 12/21/21 0754 12/21/21 1200 12/21/21 1726 12/21/21 2108 12/22/21 0808  GLUCAP 94 161* 133* 167* 131*    D-Dimer No results for input(s): "DDIMER" in the last 72 hours. Hgb A1c No results for input(s): "HGBA1C" in the last 72 hours. Lipid  Profile No results for input(s): "CHOL", "HDL", "LDLCALC", "TRIG", "CHOLHDL", "LDLDIRECT" in the last 72 hours. Thyroid function studies No results for input(s): "TSH", "T4TOTAL", "T3FREE", "THYROIDAB" in the last 72 hours.  Invalid input(s): "FREET3" Anemia work up No results for input(s): "VITAMINB12", "FOLATE", "FERRITIN", "TIBC", "IRON", "RETICCTPCT" in the last 72 hours. Urinalysis    Component Value Date/Time   COLORURINE YELLOW 12/15/2021 0032   APPEARANCEUR CLEAR 12/15/2021 0032   LABSPEC 1.023 12/15/2021 0032   PHURINE 5.0 12/15/2021 0032   GLUCOSEU >=500 (A) 12/15/2021 0032   HGBUR NEGATIVE 12/15/2021 0032   BILIRUBINUR NEGATIVE 12/15/2021 0032   BILIRUBINUR negative 12/31/2020 1548  KETONESUR 20 (A) 12/15/2021 0032   PROTEINUR 100 (A) 12/15/2021 0032   UROBILINOGEN 0.2 12/31/2020 1548   UROBILINOGEN 0.2 07/04/2010 0959   NITRITE NEGATIVE 12/15/2021 0032   LEUKOCYTESUR NEGATIVE 12/15/2021 0032   Sepsis Labs Recent Labs  Lab 12/16/21 0356  WBC 13.8*    Microbiology No results found for this or any previous visit (from the past 240 hour(s)).    SIGNED:   Nita Sells, MD  [facility] 12/22/2021, 11:01 AM Pager   If 7PM-7AM, please contact night-coverage [email or web address] Password [email or web address]

## 2021-12-22 NOTE — Progress Notes (Signed)
Patient seen examined and agree with POC  See Updated d/c summary from 12/22/2021

## 2021-12-22 NOTE — Progress Notes (Signed)
Physical Therapy Treatment Patient Details Name: Ryan Hughes MRN: [id number] DOB: 03/01/1966 Today's Date: 12/22/2021   History of Present Illness 56 y.o. male presents to [facility] hospital on 12/14/2021 with nause and vomiting. Blood glucose found to be 340 in ED, pt also retaining 1L of urine. PMH includes CVA w/ L hemiplegia and aphasia, HTN, DMII.    PT Comments    Continuing work on functional mobility and activity tolerance;  Pt is able to get up OOB and stand up from EOB and from toilet with grab bar; incr time to complete mobility tasks; Able to walk to the bathroom with RW and incr time, close guard for safety; he shows rehab potential, and recommend PT, OT and ST follow up at SNF  Recommendations for follow up therapy are one component of a multi-disciplinary discharge planning process, led by the attending physician.  Recommendations may be updated based on patient status, additional functional criteria and insurance authorization.  Follow Up Recommendations  Other (comment) (Noted plans for SNF as pt's family is having difficulty meeting his needs)     Assistance Recommended at Discharge Frequent or constant Supervision/Assistance  Patient can return home with the following A little help with walking and/or transfers;A little help with bathing/dressing/bathroom;Assistance with cooking/housework;Direct supervision/assist for medications management;Direct supervision/assist for financial management;Assist for transportation   Equipment Recommendations  Rolling walker (2 wheels)    Recommendations for Other Services       Precautions / Restrictions Precautions Precautions: Fall Precaution Comments: hx of L hemiparesis, aphasia     Mobility  Bed Mobility Overal bed mobility: Needs Assistance Bed Mobility: Supine to Sit     Supine to sit: Supervision     General bed mobility comments: pt able to come to EOB with increased time, no physical assist needed. Needed time EOB to  gain balance before mobilizing OOB    Transfers Overall transfer level: Needs assistance Equipment used: Rolling walker (2 wheels) Transfers: Sit to/from Stand Sit to Stand: Supervision           General transfer comment: pt stood safely to RW and agreeable to using it at home    Ambulation/Gait Ambulation/Gait assistance: Supervision, Min guard Gait Distance (Feet): 30 Feet Assistive device: Rolling walker (2 wheels) Gait Pattern/deviations: Decreased stance time - left, Decreased step length - right, Staggering left, Staggering right Gait velocity: decreased     General Gait Details: walked to the bathroom with RW; noted moderate loss of balance negotiating the small incline into the bathroom, recovered without physical assist   Stairs             Wheelchair Mobility    Modified Rankin (Stroke Patients Only)       Balance     Sitting balance-Leahy Scale: Fair       Standing balance-Leahy Scale: Poor                              Cognition Arousal/Alertness: Awake/alert Behavior During Therapy: Flat affect Overall Cognitive Status: Difficult to assess                                          Exercises      General Comments General comments (skin integrity, edema, etc.): Pt moved his bowels in the bathroom; Needed assist to get fully clean after BM  Pertinent Vitals/Pain Pain Assessment Pain Assessment: No/denies pain    Home Living                          Prior Function            PT Goals (current goals can now be found in the care plan section) Acute Rehab PT Goals Patient Stated Goal: Indicated need to get to the bathroom PT Goal Formulation: With patient Time For Goal Achievement: 12/31/21 Potential to Achieve Goals: Good Progress towards PT goals: Progressing toward goals    Frequency    Min 3X/week      PT Plan Current plan remains appropriate    Co-evaluation               AM-PAC PT "6 Clicks" Mobility   Outcome Measure  Help needed turning from your back to your side while in a flat bed without using bedrails?: A Little Help needed moving from lying on your back to sitting on the side of a flat bed without using bedrails?: A Little Help needed moving to and from a bed to a chair (including a wheelchair)?: A Little Help needed standing up from a chair using your arms (e.g., wheelchair or bedside chair)?: A Little Help needed to walk in hospital room?: A Little Help needed climbing 3-5 steps with a railing? : A Lot 6 Click Score: 17    End of Session Equipment Utilized During Treatment: Gait belt Activity Tolerance: Patient tolerated treatment well Patient left: with call bell/phone within reach;in chair;with chair alarm set Nurse Communication: Mobility status PT Visit Diagnosis: Unsteadiness on feet (R26.81);Muscle weakness (generalized) (M62.81);Difficulty in walking, not elsewhere classified (R26.2);Other abnormalities of gait and mobility (R26.89)     Time: 1010-1034 (minus a few minutes while pt was on the commode) PT Time Calculation (min) (ACUTE ONLY): 24 min  Charges:  $Gait Training: 8-22 mins                     Van Clines, PT  Acute Rehabilitation Services Office [phone]    Levi Aland 12/22/2021, 11:49 AM

## 2021-12-22 NOTE — Progress Notes (Signed)
OT Cancellation Note  Patient Details Name: Ryan Hughes MRN: [id number] DOB: 25-Sep-1965   Cancelled Treatment:    Reason Eval/Treat Not Completed: Patient declined, no reason specified declined to work with OT at this time though did not specify why. Will follow up as schedule permits.  Lorre Munroe 12/22/2021, 11:50 AM

## 2022-01-05 ENCOUNTER — Ambulatory Visit (INDEPENDENT_AMBULATORY_CARE_PROVIDER_SITE_OTHER): Payer: Medicaid Other | Admitting: Primary Care

## 2022-01-31 ENCOUNTER — Inpatient Hospital Stay (HOSPITAL_COMMUNITY)
Admission: EM | Admit: 2022-01-31 | Discharge: 2022-02-08 | DRG: 300 | Disposition: A | Discharge status: Skilled Nursing Facility | Payer: Medicaid Other | Source: Skilled Nursing Facility | Attending: Internal Medicine | Admitting: Internal Medicine

## 2022-01-31 ENCOUNTER — Other Ambulatory Visit: Payer: Self-pay

## 2022-01-31 ENCOUNTER — Emergency Department (HOSPITAL_COMMUNITY): Payer: Medicaid Other

## 2022-01-31 ENCOUNTER — Encounter (HOSPITAL_COMMUNITY): Payer: Self-pay | Admitting: Internal Medicine

## 2022-01-31 DIAGNOSIS — I69320 Aphasia following cerebral infarction: Secondary | ICD-10-CM | POA: Diagnosis not present

## 2022-01-31 DIAGNOSIS — Z7984 Long term (current) use of oral hypoglycemic drugs: Secondary | ICD-10-CM

## 2022-01-31 DIAGNOSIS — E1152 Type 2 diabetes mellitus with diabetic peripheral angiopathy with gangrene: Secondary | ICD-10-CM | POA: Diagnosis present

## 2022-01-31 DIAGNOSIS — D638 Anemia in other chronic diseases classified elsewhere: Secondary | ICD-10-CM

## 2022-01-31 DIAGNOSIS — Z01810 Encounter for preprocedural cardiovascular examination: Secondary | ICD-10-CM | POA: Diagnosis not present

## 2022-01-31 DIAGNOSIS — E669 Obesity, unspecified: Secondary | ICD-10-CM | POA: Diagnosis present

## 2022-01-31 DIAGNOSIS — L97519 Non-pressure chronic ulcer of other part of right foot with unspecified severity: Secondary | ICD-10-CM | POA: Diagnosis present

## 2022-01-31 DIAGNOSIS — Z79899 Other long term (current) drug therapy: Secondary | ICD-10-CM

## 2022-01-31 DIAGNOSIS — E1165 Type 2 diabetes mellitus with hyperglycemia: Secondary | ICD-10-CM | POA: Diagnosis present

## 2022-01-31 DIAGNOSIS — I998 Other disorder of circulatory system: Principal | ICD-10-CM

## 2022-01-31 DIAGNOSIS — I96 Gangrene, not elsewhere classified: Secondary | ICD-10-CM

## 2022-01-31 DIAGNOSIS — E1159 Type 2 diabetes mellitus with other circulatory complications: Secondary | ICD-10-CM | POA: Diagnosis present

## 2022-01-31 DIAGNOSIS — I10 Essential (primary) hypertension: Secondary | ICD-10-CM | POA: Diagnosis present

## 2022-01-31 DIAGNOSIS — Z993 Dependence on wheelchair: Secondary | ICD-10-CM | POA: Diagnosis not present

## 2022-01-31 DIAGNOSIS — Z6831 Body mass index (BMI) 31.0-31.9, adult: Secondary | ICD-10-CM | POA: Diagnosis not present

## 2022-01-31 DIAGNOSIS — E11621 Type 2 diabetes mellitus with foot ulcer: Secondary | ICD-10-CM | POA: Diagnosis present

## 2022-01-31 DIAGNOSIS — Z87891 Personal history of nicotine dependence: Secondary | ICD-10-CM

## 2022-01-31 DIAGNOSIS — I69354 Hemiplegia and hemiparesis following cerebral infarction affecting left non-dominant side: Secondary | ICD-10-CM | POA: Diagnosis not present

## 2022-01-31 DIAGNOSIS — I70235 Atherosclerosis of native arteries of right leg with ulceration of other part of foot: Secondary | ICD-10-CM | POA: Diagnosis not present

## 2022-01-31 DIAGNOSIS — R339 Retention of urine, unspecified: Secondary | ICD-10-CM | POA: Diagnosis present

## 2022-01-31 DIAGNOSIS — Z794 Long term (current) use of insulin: Secondary | ICD-10-CM | POA: Diagnosis not present

## 2022-01-31 DIAGNOSIS — I739 Peripheral vascular disease, unspecified: Secondary | ICD-10-CM

## 2022-01-31 DIAGNOSIS — I70261 Atherosclerosis of native arteries of extremities with gangrene, right leg: Secondary | ICD-10-CM | POA: Diagnosis present

## 2022-01-31 DIAGNOSIS — Z823 Family history of stroke: Secondary | ICD-10-CM

## 2022-01-31 LAB — CBC WITH DIFFERENTIAL/PLATELET
Abs Immature Granulocytes: 0.02 10*3/uL (ref 0.00–0.07)
Basophils Absolute: 0 10*3/uL (ref 0.0–0.1)
Basophils Relative: 0 %
Eosinophils Absolute: 0.1 10*3/uL (ref 0.0–0.5)
Eosinophils Relative: 1 %
HCT: 32 % — ABNORMAL LOW (ref 39.0–52.0)
Hemoglobin: 10.6 g/dL — ABNORMAL LOW (ref 13.0–17.0)
Immature Granulocytes: 0 %
Lymphocytes Relative: 38 %
Lymphs Abs: 3.3 10*3/uL (ref 0.7–4.0)
MCH: 30.4 pg (ref 26.0–34.0)
MCHC: 33.1 g/dL (ref 30.0–36.0)
MCV: 91.7 fL (ref 80.0–100.0)
Monocytes Absolute: 0.5 10*3/uL (ref 0.1–1.0)
Monocytes Relative: 6 %
Neutro Abs: 4.7 10*3/uL (ref 1.7–7.7)
Neutrophils Relative %: 55 %
Platelets: 346 10*3/uL (ref 150–400)
RBC: 3.49 MIL/uL — ABNORMAL LOW (ref 4.22–5.81)
RDW: 13.4 % (ref 11.5–15.5)
WBC: 8.7 10*3/uL (ref 4.0–10.5)
nRBC: 0 % (ref 0.0–0.2)

## 2022-01-31 LAB — BASIC METABOLIC PANEL
Anion gap: 11 (ref 5–15)
BUN: 18 mg/dL (ref 6–20)
CO2: 26 mmol/L (ref 22–32)
Calcium: 9.3 mg/dL (ref 8.9–10.3)
Chloride: 106 mmol/L (ref 98–111)
Creatinine, Ser: 1.08 mg/dL (ref 0.61–1.24)
GFR, Estimated: >60 mL/min (ref >60)
Glucose, Bld: 101 mg/dL — ABNORMAL HIGH (ref 70–99)
Potassium: 4 mmol/L (ref 3.5–5.1)
Sodium: 143 mmol/L (ref 135–145)

## 2022-01-31 LAB — CBG MONITORING, ED: Glucose-Capillary: 104 mg/dL — ABNORMAL HIGH (ref 70–99)

## 2022-01-31 LAB — LACTIC ACID, PLASMA: Lactic Acid, Venous: 1.6 mmol/L (ref 0.5–1.9)

## 2022-01-31 MED ORDER — INSULIN ASPART 100 UNIT/ML IJ SOLN: 0.0000 [IU] | Freq: Every day | INTRAMUSCULAR | Status: DC

## 2022-01-31 MED ORDER — INSULIN ASPART 100 UNIT/ML IJ SOLN
0.0000 [IU] | Freq: Three times a day (TID) | INTRAMUSCULAR | Status: DC
  Administered 2022-02-01: 3 [IU] via SUBCUTANEOUS
  Administered 2022-02-02: 8 [IU] via SUBCUTANEOUS
  Administered 2022-02-03 (×2): 2 [IU] via SUBCUTANEOUS
  Administered 2022-02-03 – 2022-02-04 (×3): 3 [IU] via SUBCUTANEOUS
  Administered 2022-02-05: 5 [IU] via SUBCUTANEOUS
  Administered 2022-02-05 – 2022-02-06 (×3): 3 [IU] via SUBCUTANEOUS
  Administered 2022-02-07: 2 [IU] via SUBCUTANEOUS
  Administered 2022-02-07: 3 [IU] via SUBCUTANEOUS
  Administered 2022-02-08: 5 [IU] via SUBCUTANEOUS

## 2022-01-31 MED ORDER — OXYCODONE HCL 5 MG PO TABS
5.0000 mg | ORAL_TABLET | ORAL | Status: DC | PRN
  Administered 2022-02-01 (×2): 5 mg via ORAL
  Filled 2022-01-31 (×2): qty 1

## 2022-01-31 MED ORDER — ATORVASTATIN CALCIUM 80 MG PO TABS
80.0000 mg | ORAL_TABLET | Freq: Every day | ORAL | Status: DC
  Administered 2022-01-31 – 2022-02-05 (×6): 80 mg via ORAL
  Filled 2022-01-31 (×4): qty 1
  Filled 2022-01-31: qty 2
  Filled 2022-01-31 (×2): qty 1

## 2022-01-31 MED ORDER — ASPIRIN 325 MG PO TABS
325.0000 mg | ORAL_TABLET | Freq: Every day | ORAL | Status: DC
  Administered 2022-01-31 – 2022-02-08 (×8): 325 mg via ORAL
  Filled 2022-01-31 (×8): qty 1

## 2022-01-31 MED ORDER — MORPHINE SULFATE (PF) 2 MG/ML IV SOLN
2.0000 mg | INTRAVENOUS | Status: DC | PRN
  Administered 2022-02-04: 2 mg via INTRAVENOUS
  Filled 2022-01-31: qty 1

## 2022-01-31 MED ORDER — ONDANSETRON HCL 4 MG PO TABS: 4.0000 mg | ORAL_TABLET | Freq: Four times a day (QID) | ORAL | Status: DC | PRN

## 2022-01-31 MED ORDER — SENNOSIDES-DOCUSATE SODIUM 8.6-50 MG PO TABS: 1.0000 | ORAL_TABLET | Freq: Every evening | ORAL | Status: DC | PRN

## 2022-01-31 MED ORDER — ACETAMINOPHEN 650 MG RE SUPP: 650.0000 mg | Freq: Four times a day (QID) | RECTAL | Status: DC | PRN

## 2022-01-31 MED ORDER — SODIUM CHLORIDE 0.9 % IV SOLN: INTRAVENOUS | Status: DC

## 2022-01-31 MED ORDER — ACETAMINOPHEN 325 MG PO TABS: 650.0000 mg | ORAL_TABLET | Freq: Four times a day (QID) | ORAL | Status: DC | PRN

## 2022-01-31 MED ORDER — ONDANSETRON HCL 4 MG/2ML IJ SOLN: 4.0000 mg | Freq: Four times a day (QID) | INTRAMUSCULAR | Status: DC | PRN

## 2022-01-31 NOTE — Progress Notes (Signed)
VASCULAR LAB    ABI has been performed.  See CV proc for preliminary results.  Messaged results to Dr. Vanita Panda via secure chat  Sharion Dove, RVT 01/31/2022, 4:15 PM

## 2022-01-31 NOTE — Consult Note (Signed)
ED Consult    Reason for Consult:  Right great toe gangrene Referring Physician:  Dr. Vanita Panda MRN #:  [id number]  History of Present Illness This is a 56 y.o. male without previous vascular surgery intervention.  He does have diabetes, hypertension and was previously a smoker.  He currently lives in a facility here in [place].  There is no family present to help with conversation he does have some recollection of his history.  States that 2 weeks ago he bumped his right great toe on his wheelchair.  He is able to walk but mostly in a wheelchair at the facility.  Does not take any blood thinners.  Currently having pain in the right foot denies any fevers or chills.  Past Medical History:  Diagnosis Date   Diabetes mellitus without complication ([place])    Hypertension    Nicotine dependence, cigarettes, uncomplicated 40/98/1191    No past surgical history on file.  Allergies  Allergen Reactions   Pork-Derived Products Other (See Comments)    Causes gout flares   Shellfish-Derived Products Other (See Comments)    Causes gout flares    Prior to Admission medications   Medication Sig Start Date End Date Taking? Authorizing Provider  atorvastatin (LIPITOR) 80 MG tablet Take 1 tablet (80 mg total) by mouth at bedtime. Patient not taking: Reported on 12/18/2021 12/10/21   [name], NP  carvedilol (COREG) 6.25 MG tablet Take 1 tablet (6.25 mg total) by mouth 2 (two) times daily with a meal. 12/20/21   Charlynne Cousins, MD  Dulaglutide (TRULICITY) 4.78 GN/5.6OZ SOPN Inject 0.75 mg into the skin once a week. Patient not taking: Reported on 12/18/2021 12/08/21   [name], NP  dutasteride (AVODART) 0.5 MG capsule Take 1 capsule (0.5 mg total) by mouth daily. 12/20/21   Charlynne Cousins, MD  folic acid (FOLVITE) 1 MG tablet Take 1 mg by mouth daily. Patient not taking: Reported on 12/18/2021    [provider]  glipiZIDE (GLUCOTROL) 5 MG tablet Take 1 tablet (5  mg total) by mouth 2 (two) times daily. 12/20/21 12/20/22  Charlynne Cousins, MD  hydrochlorothiazide (HYDRODIURIL) 12.5 MG tablet Take 1 tablet (12.5 mg total) by mouth daily. 12/20/21   Charlynne Cousins, MD  insulin glargine (LANTUS) 100 UNIT/ML Solostar Pen Inject 15 Units into the skin 2 (two) times daily. Patient not taking: Reported on 12/18/2021 12/08/21   [name], NP  losartan (COZAAR) 50 MG tablet Take 1 tablet (50 mg total) by mouth daily. 12/20/21   Charlynne Cousins, MD  metFORMIN (GLUCOPHAGE) 1000 MG tablet Take 1 tablet (1,000 mg total) by mouth 2 (two) times daily with a meal. Patient not taking: Reported on 12/18/2021 04/19/21 02/12/22  [name], NP  Multiple Vitamin (MULTIVITAMIN WITH MINERALS) TABS tablet Take 1 tablet by mouth daily. Patient not taking: Reported on 12/08/2021 03/29/21   Aline August, MD  pantoprazole (PROTONIX) 40 MG tablet Take 1 tablet (40 mg total) by mouth 2 (two) times daily. 12/20/21   Charlynne Cousins, MD  potassium chloride (KLOR-CON) 10 MEQ tablet Take 2 tablets (20 mEq total) by mouth daily. Patient not taking: Reported on 12/08/2021 04/19/21   [name], NP  tamsulosin (FLOMAX) 0.4 MG CAPS capsule Take 2 capsules (0.8 mg total) by mouth daily. 12/20/21   Charlynne Cousins, MD  thiamine 100 MG tablet Take 1 tablet (100 mg total) by mouth daily. Patient not taking: Reported on 12/18/2021  03/29/21   Glade Lloyd, MD    Social History   Socioeconomic History   Marital status: Single    Spouse name: Not on file   Number of children: Not on file   Years of education: Not on file   Highest education level: Not on file  Occupational History   Not on file  Tobacco Use   Smoking status: Every Day    Packs/day: 0.50    Types: Cigarettes   Smokeless tobacco: Never  Vaping Use   Vaping Use: Never used  Substance and Sexual Activity   Alcohol use: Yes   Drug use: Yes    Types: Marijuana   Sexual activity: Yes     Birth control/protection: Condom  Other Topics Concern   Not on file  Social History Narrative   Not on file   Social Determinants of Health   Financial Resource Strain: Not on file  Food Insecurity: No Food Insecurity (05/06/2020)   Hunger Vital Sign    Worried About Running Out of Food in the Last Year: Never true    Ran Out of Food in the Last Year: Never true  Transportation Needs: No Transportation Needs (05/06/2020)   PRAPARE - Administrator, Civil Service (Medical): No    Lack of Transportation (Non-Medical): No  Physical Activity: Not on file  Stress: Not on file  Social Connections: Not on file  Intimate Partner Violence: Not on file     Family History  Problem Relation Age of Onset   Stroke Mother    Review of Systems  Constitutional: Negative.   HENT: Negative.    Eyes: Negative.   Respiratory: Negative.    Cardiovascular: Negative.   Gastrointestinal: Negative.   Musculoskeletal:        Toe pain  Skin: Negative.   Neurological: Negative.   Endo/Heme/Allergies: Negative.   Psychiatric/Behavioral: Negative.        Physical Examination  Vitals:   01/31/22 1545 01/31/22 1642  BP: 135/83 137/86  Pulse: 77 83  Resp: (!) 23 16  Temp:  98.6 F (37 C)  SpO2: 100% 100%   There is no height or weight on file to calculate BMI.  Physical Exam HENT:     Head: Normocephalic.  Eyes:     Pupils: Pupils are equal, round, and reactive to light.  Cardiovascular:     Pulses:          Femoral pulses are 2+ on the right side and 2+ on the left side.      Popliteal pulses are 0 on the right side and 0 on the left side.       Dorsalis pedis pulses are 0 on the right side and 0 on the left side.       Posterior tibial pulses are 0 on the right side and 0 on the left side.  Abdominal:     General: Abdomen is flat.     Palpations: Abdomen is soft. There is no mass.  Musculoskeletal:        General: Normal range of motion.     Comments: As  pictured below  Neurological:     General: No focal deficit present.     Mental Status: He is alert.  Psychiatric:        Mood and Affect: Mood normal.        Thought Content: Thought content normal.        Judgment: Judgment normal.  CBC    Component Value Date/Time   WBC 8.7 01/31/2022 1459   RBC 3.49 (L) 01/31/2022 1459   HGB 10.6 (L) 01/31/2022 1459   HGB 15.3 12/08/2021 1102   HCT 32.0 (L) 01/31/2022 1459   HCT 46.8 12/08/2021 1102   PLT 346 01/31/2022 1459   PLT 307 12/08/2021 1102   MCV 91.7 01/31/2022 1459   MCV 91 12/08/2021 1102   MCH 30.4 01/31/2022 1459   MCHC 33.1 01/31/2022 1459   RDW 13.4 01/31/2022 1459   RDW 13.4 12/08/2021 1102   LYMPHSABS 3.3 01/31/2022 1459   LYMPHSABS 1.9 12/08/2021 1102   MONOABS 0.5 01/31/2022 1459   EOSABS 0.1 01/31/2022 1459   EOSABS 0.0 12/08/2021 1102   BASOSABS 0.0 01/31/2022 1459   BASOSABS 0.0 12/08/2021 1102    BMET    Component Value Date/Time   NA 143 01/31/2022 1459   NA 138 12/08/2021 1102   K 4.0 01/31/2022 1459   CL 106 01/31/2022 1459   CO2 26 01/31/2022 1459   GLUCOSE 101 (H) 01/31/2022 1459   BUN 18 01/31/2022 1459   BUN 14 12/08/2021 1102   CREATININE 1.08 01/31/2022 1459   CALCIUM 9.3 01/31/2022 1459   GFRNONAA >60 01/31/2022 1459   GFRAA >60 03/17/2019 0839    COAGS: Lab Results  Component Value Date   INR 0.9 09/16/2021     Non-Invasive Vascular Imaging:   ABI Findings:  +---------+------------------+-----+-------------------+--------+  Right    Rt Pressure (mmHg)IndexWaveform           Comment   +---------+------------------+-----+-------------------+--------+  Brachial 147                    triphasic                    +---------+------------------+-----+-------------------+--------+  PTA      69                0.47 dampened monophasic          +---------+------------------+-----+-------------------+--------+  DP       81                0.55 dampened  monophasic          +---------+------------------+-----+-------------------+--------+  Great Toe                       Absent                       +---------+------------------+-----+-------------------+--------+   +---------+------------------+-----+----------+-------+  Left     Lt Pressure (mmHg)IndexWaveform  Comment  +---------+------------------+-----+----------+-------+  Brachial 142                    triphasic          +---------+------------------+-----+----------+-------+  PTA      254               1.73 monophasic         +---------+------------------+-----+----------+-------+  DP       254               1.73 monophasic         +---------+------------------+-----+----------+-------+  Great Toe69                0.47                    +---------+------------------+-----+----------+-------+   +-------+---------------------+-----------+------------+------------+  ABI/TBIToday's ABI  Today's TBIPrevious ABIPrevious TBI  +-------+---------------------+-----------+------------+------------+  Right  0.55                 absent                               +-------+---------------------+-----------+------------+------------+  Left   1.73 non compressible0.47                                 +-------+---------------------+-----------+------------+------------+           Summary:  Right: Resting right ankle-brachial index indicates moderate right lower  extremity arterial disease. The right toe-brachial index is abnormal.   Left: Resting left ankle-brachial index indicates noncompressible left  lower extremity arteries. The left toe-brachial index is abnormal.   ASSESSMENT/PLAN: This is a 56 y.o. 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  Bilateral common femoral pulses are palpable but nothing below that.  Plan will be for angiography from a left common femoral approach  likely on Wednesday of this week.  Patient should be on aspirin and continue his statin.  Keidan Aumiller C. Randie Heinz, MD [facility] Office: [phone] Pager: [phone]

## 2022-01-31 NOTE — ED Notes (Signed)
Pt got out of bed and opened the door. This RN asked the pt to please sit back on the bed so that he wouldn't fall and get hurt. Pt got back in bed. Asked pt why he was getting up out of bed and pt said, "Because I'm leaving". Asked pt to give me a moment to get the doctor to talk to him. Notified Lockwood MD about pt wanting to leave and being uncooperative about IV placement. Vanita Panda MD came to bedside and explained that if pt left he could lose his foot and maybe his leg. Pt finally was agreeable to stay and accept IV placement. Placing IV team consult. Updated admitting of situation.

## 2022-01-31 NOTE — ED Notes (Signed)
Pt being transported to vascular at this time

## 2022-01-31 NOTE — ED Notes (Signed)
[place] PA unable to doppler pulse in L foot

## 2022-01-31 NOTE — ED Triage Notes (Signed)
Pt with hx of stroke and dementia here for vascular consult from [facility]. Reportedly stubbed his toe against a wall while using his wheelchair two days ago. Facility has been watching it but has not improved. First two toes black. Denies pain or drainage.

## 2022-01-31 NOTE — ED Notes (Signed)
Alekh MD at bedside

## 2022-01-31 NOTE — ED Provider Triage Note (Signed)
Emergency Medicine Provider Triage Evaluation Note  Ryan Hughes , a 55 y.o. male  was evaluated in triage.  Pt complains of right foot pain.  Patient states he stubbed his toe 2 weeks ago and his first and second toe however getting progressively black.  According to EMS he stubbed his toe 2 days ago.  He states he has numbness, tingling and a "cold" sensation to his first 3 digits.  No history of PVD per patient.  Review of Systems  Positive: Right foot pain Negative:   Physical Exam  BP (!) 145/84 (BP Location: Left Arm)   Pulse 78   Temp 97.9 F (36.6 C)   Resp 15   SpO2 100%  Gen:   Awake, no distress   Resp:  Normal effort  MSK:   Moves extremities without difficulty, violaceous darkening to digits 1 and 2 at right foot.  Admits to decreased sensation with palpation. Other:    Medical Decision Making  Medically screening exam initiated at 2:39 PM.  Appropriate orders placed.  Ralston Venus was informed that the remainder of the evaluation will be completed by another provider, this initial triage assessment does not replace that evaluation, and the importance of remaining in the ED until their evaluation is complete.  Injury to right foot vs ischemia   Possible minimal monophasic Doppler to right foot however no strong pulses at DP on right foot  Nursing aware patient needs room in back   Maddux Vanscyoc A, PA-C 01/31/22 1441

## 2022-01-31 NOTE — ED Notes (Addendum)
IV start x 1 unsuccessful by different RN. This RN was all set up and found a vein via [place] and pt refused IV start. Notified MD.

## 2022-01-31 NOTE — H&P (Addendum)
History and Physical    Ryan Hughes [id number] DOB: Jun 20, 1965 DOA: 01/31/2022  PCP: Grayce Sessions, NP   Patient coming from: Home  I have personally briefly reviewed patient's old medical records in [facility]  Chief Complaint: Toe injury  HPI: Ryan Hughes is a 56 y.o. 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  He apparently stubbed his right foot when getting out of his wheelchair about 2 weeks ago.  History is not that reliable.  Patient noted darkening of his right lower extremity recently.  No fever, vomiting, loss of consciousness or seizures reported.  He is currently mostly wheelchair-bound.  No other history could be obtained because of patient being a poor historian.  ED Course: Blood work was unremarkable.  Lower extremity ABI showed moderate right lower extremity arterial disease.  Right foot x-ray showed no recent fracture or dislocation.  ED provider consulted vascular surgery/Dr. Randie Heinz who recommended admission for possible angiography. Hospitalist service was called to evaluate the patient.  Review of Systems: As per HPI otherwise all other systems were reviewed and are negative.   Past Medical History:  Diagnosis Date   Diabetes mellitus without complication (HCC)    Hypertension    Nicotine dependence, cigarettes, uncomplicated 04/25/2020    No past surgical history on file.   reports that he has been smoking cigarettes. He has been smoking an average of .5 packs per day. He has never used smokeless tobacco. He reports current alcohol use. He reports current drug use. Drug:  Marijuana.  Allergies  Allergen Reactions   Pork-Derived Products Other (See Comments)    Causes gout flares   Shellfish-Derived Products Other (See Comments)    Causes gout flares    Family History  Problem Relation Age of Onset   Stroke Mother     Prior to Admission medications   Medication Sig Start Date End Date Taking? Authorizing Provider  atorvastatin (LIPITOR) 80 MG tablet Take 1 tablet (80 mg total) by mouth at bedtime. Patient not taking: Reported on 12/18/2021 12/10/21   Grayce Sessions, NP  carvedilol (COREG) 6.25 MG tablet Take 1 tablet (6.25 mg total) by mouth 2 (two) times daily with a meal. 12/20/21   Marinda Elk, MD  Dulaglutide (TRULICITY) 0.75 MG/0.5ML SOPN Inject 0.75 mg into the skin once a week. Patient not taking: Reported on 12/18/2021 12/08/21   Grayce Sessions, NP  dutasteride (AVODART) 0.5 MG capsule Take 1 capsule (0.5 mg total) by mouth daily. 12/20/21   Marinda Elk, MD  folic acid (FOLVITE) 1 MG tablet Take 1 mg by mouth daily. Patient not taking: Reported on 12/18/2021    [provider]  glipiZIDE (GLUCOTROL) 5 MG tablet Take 1 tablet (5 mg total) by mouth 2 (two) times daily. 12/20/21 12/20/22  Marinda Elk, MD  hydrochlorothiazide (HYDRODIURIL) 12.5 MG tablet Take 1 tablet (12.5 mg total) by mouth daily. 12/20/21   Marinda Elk, MD  insulin glargine (LANTUS) 100 UNIT/ML Solostar Pen Inject 15 Units into the skin 2 (two) times daily. Patient not taking: Reported on 12/18/2021 12/08/21   Grayce Sessions, NP  losartan (COZAAR) 50 MG tablet Take 1 tablet (50 mg total) by mouth daily.  12/20/21   Charlynne Cousins, MD  metFORMIN (GLUCOPHAGE) 1000 MG tablet Take 1 tablet (1,000 mg total) by mouth 2 (two) times daily with a meal. Patient not taking: Reported on 12/18/2021 04/19/21 02/12/22  [name], NP  Multiple Vitamin (MULTIVITAMIN WITH MINERALS) TABS tablet Take 1 tablet by mouth daily. Patient not  taking: Reported on 12/08/2021 03/29/21   Aline August, MD  pantoprazole (PROTONIX) 40 MG tablet Take 1 tablet (40 mg total) by mouth 2 (two) times daily. 12/20/21   Charlynne Cousins, MD  potassium chloride (KLOR-CON) 10 MEQ tablet Take 2 tablets (20 mEq total) by mouth daily. Patient not taking: Reported on 12/08/2021 04/19/21   [name], NP  tamsulosin (FLOMAX) 0.4 MG CAPS capsule Take 2 capsules (0.8 mg total) by mouth daily. 12/20/21   Charlynne Cousins, MD  thiamine 100 MG tablet Take 1 tablet (100 mg total) by mouth daily. Patient not taking: Reported on 12/18/2021 03/29/21   Aline August, MD    Physical Exam: Vitals:   01/31/22 1433 01/31/22 1545 01/31/22 1642  BP: (!) 145/84 135/83 137/86  Pulse: 78 77 83  Resp: 15 (!) 23 16  Temp: 97.9 F (36.6 C)  98.6 F (37 C)  TempSrc:   Oral  SpO2: 100% 100% 100%    Constitutional: NAD, calm, comfortable.  Poor historian.  Looks chronically ill and deconditioned. Vitals:   01/31/22 1433 01/31/22 1545 01/31/22 1642  BP: (!) 145/84 135/83 137/86  Pulse: 78 77 83  Resp: 15 (!) 23 16  Temp: 97.9 F (36.6 C)  98.6 F (37 C)  TempSrc:   Oral  SpO2: 100% 100% 100%   Eyes: PERRL, lids and conjunctivae normal ENMT: Mucous membranes are moist. Posterior pharynx clear of any exudate or lesions. Neck: normal, supple, no masses, no thyromegaly Respiratory: bilateral decreased breath sounds at bases, no wheezing, no crackles. Normal respiratory effort. No accessory muscle use.  Cardiovascular: S1 S2 positive, rate controlled. No extremity edema. 2+ pedal pulses.  Abdomen: no tenderness, no masses palpated. No hepatosplenomegaly. Bowel sounds positive.  Musculoskeletal: no clubbing / cyanosis. No obvious other joint deformity/tenderness or erythema  skin: Right foot first and second toes necrotic changes present.  No ecchymosis noted. Neurologic: CN 2-12 grossly intact.  Mildly weak in the left side.  Moves extremities.  Slow  to respond. Psychiatric: Awake, extremely poor historian.  Flat affect.  No signs of agitation.   Labs on Admission: I have personally reviewed following labs and imaging studies  CBC: Recent Labs  Lab 01/31/22 1459  WBC 8.7  NEUTROABS 4.7  HGB 10.6*  HCT 32.0*  MCV 91.7  PLT 123456   Basic Metabolic Panel: Recent Labs  Lab 01/31/22 1459  NA 143  K 4.0  CL 106  CO2 26  GLUCOSE 101*  BUN 18  CREATININE 1.08  CALCIUM 9.3   GFR: CrCl cannot be calculated (Unknown ideal weight.). Liver Function Tests: No results for input(s): "AST", "ALT", "ALKPHOS", "BILITOT", "PROT", "ALBUMIN" in the last 168 hours. No results for input(s): "LIPASE", "AMYLASE" in the last 168 hours. No results for input(s): "AMMONIA" in the last 168 hours. Coagulation Profile: No results for input(s): "INR", "PROTIME" in the last 168 hours. Cardiac Enzymes: No results for input(s): "CKTOTAL", "CKMB", "CKMBINDEX", "TROPONINI" in the last 168 hours. BNP (last 3 results) No results for input(s): "PROBNP" in the last 8760 hours. HbA1C: No results for input(s): "HGBA1C" in the last 72 hours. CBG: No results for input(s): "  GLUCAP" in the last 168 hours. Lipid Profile: No results for input(s): "CHOL", "HDL", "LDLCALC", "TRIG", "CHOLHDL", "LDLDIRECT" in the last 72 hours. Thyroid Function Tests: No results for input(s): "TSH", "T4TOTAL", "FREET4", "T3FREE", "THYROIDAB" in the last 72 hours. Anemia Panel: No results for input(s): "VITAMINB12", "FOLATE", "FERRITIN", "TIBC", "IRON", "RETICCTPCT" in the last 72 hours. Urine analysis:    Component Value Date/Time   COLORURINE YELLOW 12/15/2021 0032   APPEARANCEUR CLEAR 12/15/2021 0032   LABSPEC 1.023 12/15/2021 0032   PHURINE 5.0 12/15/2021 0032   GLUCOSEU >=500 (A) 12/15/2021 0032   HGBUR NEGATIVE 12/15/2021 0032   BILIRUBINUR NEGATIVE 12/15/2021 0032   BILIRUBINUR negative 12/31/2020 1548   KETONESUR 20 (A) 12/15/2021 0032   PROTEINUR 100 (A)  12/15/2021 0032   UROBILINOGEN 0.2 12/31/2020 1548   UROBILINOGEN 0.2 07/04/2010 0959   NITRITE NEGATIVE 12/15/2021 0032   LEUKOCYTESUR NEGATIVE 12/15/2021 0032    Radiological Exams on Admission: VAS [place] ABI WITH/WO TBI  Result Date: 01/31/2022  LOWER EXTREMITY DOPPLER STUDY Patient Name:  Ryan Hughes  Date of Exam:   01/31/2022 Medical Rec #: [id number]      Accession #:    [id number] Date of Birth: 1965-12-25      Patient Gender: M Patient Age:   18 years Exam Location:  [facility] Procedure:      VAS [place] ABI WITH/WO TBI Referring Phys: BRITNI HENDERLY --------------------------------------------------------------------------------  Indications: Gangrene. High Risk Factors: Hypertension, hyperlipidemia, Diabetes, current smoker, prior                    CVA. Other Factors: AICD, pacemaker.  Comparison Study: No prior study Performing Technologist: Sharion Dove RVS  Examination Guidelines: A complete evaluation includes at minimum, Doppler waveform signals and systolic blood pressure reading at the level of bilateral brachial, anterior tibial, and posterior tibial arteries, when vessel segments are accessible. Bilateral testing is considered an integral part of a complete examination. Photoelectric Plethysmograph (PPG) waveforms and toe systolic pressure readings are included as required and additional duplex testing as needed. Limited examinations for reoccurring indications may be performed as noted.  ABI Findings: +---------+------------------+-----+-------------------+--------+ Right    Rt Pressure (mmHg)IndexWaveform           Comment  +---------+------------------+-----+-------------------+--------+ Brachial 147                    triphasic                   +---------+------------------+-----+-------------------+--------+ PTA      69                0.47 dampened monophasic         +---------+------------------+-----+-------------------+--------+ DP       81                 0.55 dampened monophasic         +---------+------------------+-----+-------------------+--------+ Great Toe                       Absent                      +---------+------------------+-----+-------------------+--------+ +---------+------------------+-----+----------+-------+ Left     Lt Pressure (mmHg)IndexWaveform  Comment +---------+------------------+-----+----------+-------+ Brachial 142                    triphasic         +---------+------------------+-----+----------+-------+ PTA      254  1.73 monophasic        +---------+------------------+-----+----------+-------+ DP       254               1.73 monophasic        +---------+------------------+-----+----------+-------+ Great Toe69                0.47                   +---------+------------------+-----+----------+-------+ +-------+---------------------+-----------+------------+------------+ ABI/TBIToday's ABI          Today's TBIPrevious ABIPrevious TBI +-------+---------------------+-----------+------------+------------+ Right  0.55                 absent                              +-------+---------------------+-----------+------------+------------+ Left   1.73 non compressible0.47                                +-------+---------------------+-----------+------------+------------+   Summary: Right: Resting right ankle-brachial index indicates moderate right lower extremity arterial disease. The right toe-brachial index is abnormal. Left: Resting left ankle-brachial index indicates noncompressible left lower extremity arteries. The left toe-brachial index is abnormal. *See table(s) above for measurements and observations.     Preliminary    DG Foot Complete Right  Result Date: 01/31/2022 CLINICAL DATA:  Stubbed toes, pain EXAM: RIGHT FOOT COMPLETE - 3+ VIEW COMPARISON:  None Available. FINDINGS: No displaced fracture or dislocation is seen. Small bony spurs seen in the  interphalangeal joint of big toe. Minimal bony spurs seen in first metatarsophalangeal joint. Bony spurs are noted in the dorsal aspect of intertarsal and tarsometatarsal joints. Arterial calcifications are seen in soft tissues. IMPRESSION: No recent fracture or dislocation is seen. Degenerative changes are noted in multiple joints as described in the body of the report. Electronically Signed   By: Elmer Picker M.D.   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  ABI shows moderate disease in right lower extremity. -Vascular surgery planning for possible angiography on Wednesday.  Recommend to continue statin along with aspirin.  Will start aspirin 325 mg daily and Lipitor 80 mg daily.  Diabetes mellitus type 2 Continue CBGs with SSI  Hypertension -Monitor blood pressure.  Resume home regimen once verified  History of unspecified CVA with residual left hemiplegia -Currently stable.  Outpatient follow-up with neurology.  Continue aspirin and statin.  DVT prophylaxis: SCDs.  Hold Lovenox in case patient undergoes vascular intervention Code Status: Full Family Communication: None at bedside Disposition Plan: Possibly back to facility in 2 to 3 days once cleared by vascular surgeon Consults called: Vascular surgery by ED provider Admission status: Inpatient/telemetry  Severity of Illness: The appropriate patient status for this patient is INPATIENT. Inpatient status is judged to be reasonable and necessary in order to provide the required intensity of service to ensure the patient's safety. The patient's presenting symptoms, physical exam findings, and initial radiographic and laboratory data in the context of their chronic comorbidities is felt to place them at high risk for further clinical  deterioration. Furthermore, it is not anticipated that the patient will be medically stable for discharge from the hospital within 2 midnights of admission.   * I  certify that at the point of admission it is my clinical judgment that the patient will require inpatient hospital care spanning beyond 2 midnights from the point of admission due to high intensity of service, high risk for further deterioration and high frequency of surveillance required.Aline August MD [facility]  01/31/2022, 5:45 PM

## 2022-01-31 NOTE — ED Provider Notes (Addendum)
[facility] EMERGENCY DEPARTMENT Provider Note   CSN: [id number] Arrival date & time: 01/31/22  1427     History  Chief Complaint  Patient presents with   Toe Injury    Zeik Loeb is a 56 y.o. male.  History of T2DM, HTN, prior CVA and tobacco use presents with right foot pain from [facility]. He reports stubbing his right foot when getting out of his wheelchair about 2 weeks ago. Per EMS, it was reported that he injured his foot 2 days ago. His right foot is tender to touch with difficulty moving his toes. He reports he is able to ambulate some with a cane prior to this. He notes darkening of his lower right extremity recently.        Home Medications Prior to Admission medications   Medication Sig Start Date End Date Taking? Authorizing Provider  acetaminophen (TYLENOL) 325 MG tablet Take 650 mg by mouth every 4 (four) hours as needed (for pain).   Yes [provider]  atorvastatin (LIPITOR) 80 MG tablet Take 1 tablet (80 mg total) by mouth at bedtime. Patient taking differently: Take 80 mg by mouth every evening. 12/10/21  Yes Grayce Sessions, NP  carvedilol (COREG) 6.25 MG tablet Take 1 tablet (6.25 mg total) by mouth 2 (two) times daily with a meal. 12/20/21  Yes Marinda Elk, MD  Dulaglutide (TRULICITY) 0.75 MG/0.5ML SOPN Inject 0.75 mg into the skin once a week. Patient taking differently: Inject 0.75 mg into the skin every Thursday. 12/08/21  Yes Grayce Sessions, NP  dutasteride (AVODART) 0.5 MG capsule Take 1 capsule (0.5 mg total) by mouth daily. 12/20/21  Yes Marinda Elk, MD  glipiZIDE (GLUCOTROL) 5 MG tablet Take 1 tablet (5 mg total) by mouth 2 (two) times daily. 12/20/21 12/20/22 Yes Marinda Elk, MD  hydrochlorothiazide (HYDRODIURIL) 12.5 MG tablet Take 1 tablet (12.5 mg total) by mouth daily. 12/20/21  Yes Marinda Elk, MD  insulin glargine-yfgn (SEMGLEE, YFGN,) 100 UNIT/ML Pen Inject 15 Units into the  skin at bedtime.   Yes [provider]  losartan (COZAAR) 50 MG tablet Take 1 tablet (50 mg total) by mouth daily. 12/20/21  Yes Marinda Elk, MD  metFORMIN (GLUCOPHAGE) 1000 MG tablet Take 1 tablet (1,000 mg total) by mouth 2 (two) times daily with a meal. 04/19/21 02/12/22 Yes Grayce Sessions, NP  Multiple Vitamin (MULTIVITAMIN WITH MINERALS) TABS tablet Take 1 tablet by mouth daily. 03/29/21  Yes Glade Lloyd, MD  pantoprazole (PROTONIX) 40 MG tablet Take 1 tablet (40 mg total) by mouth 2 (two) times daily. 12/20/21  Yes Marinda Elk, MD  potassium chloride (KLOR-CON) 10 MEQ tablet Take 2 tablets (20 mEq total) by mouth daily. 04/19/21  Yes Grayce Sessions, NP  tamsulosin (FLOMAX) 0.4 MG CAPS capsule Take 2 capsules (0.8 mg total) by mouth daily. 12/20/21  Yes Marinda Elk, MD  thiamine 100 MG tablet Take 1 tablet (100 mg total) by mouth daily. 03/29/21  Yes Glade Lloyd, MD  traMADol (ULTRAM) 50 MG tablet Take 25 mg by mouth every 6 (six) hours as needed for moderate pain or severe pain.   Yes [provider]  TYLENOL 500 MG tablet Take 1,000 mg by mouth daily as needed (for pain).   Yes [provider]  insulin glargine (LANTUS) 100 UNIT/ML Solostar Pen Inject 15 Units into the skin 2 (two) times daily. Patient not taking: Reported on 01/31/2022 12/08/21  [name], NP      Allergies    Pork-derived products and Shellfish-derived products    Review of Systems   Review of Systems  Constitutional:  Negative for chills and fever.  Cardiovascular:  Negative for chest pain.  Musculoskeletal:        Right foot pain    Physical Exam Updated Vital Signs BP 137/86 (BP Location: Right Arm)   Pulse 83   Temp 98.6 F (37 C) (Oral)   Resp 16   SpO2 100%  Physical Exam Constitutional:      General: He is not in acute distress. HENT:     Head: Normocephalic and atraumatic.  Cardiovascular:     Rate and Rhythm: Normal rate  and regular rhythm.     Pulses:          Dorsalis pedis pulses are detected w/ Doppler on the left side.       Posterior tibial pulses are detected w/ Doppler on the right side.  Pulmonary:     Effort: Pulmonary effort is normal.  Feet:     Comments: Right Foot: necrosis of the distal first and second toes, cool to touch, tender to palpation, no drainage noted Neurological:     Mental Status: He is alert.     ED Results / Procedures / Treatments   Labs (all labs ordered are listed, but only abnormal results are displayed) Labs Reviewed  CBC WITH DIFFERENTIAL/PLATELET - Abnormal; Notable for the following components:      Result Value   RBC 3.49 (*)    Hemoglobin 10.6 (*)    HCT 32.0 (*)    All other components within normal limits  BASIC METABOLIC PANEL - Abnormal; Notable for the following components:   Glucose, Bld 101 (*)    All other components within normal limits  LACTIC ACID, PLASMA  CBC  COMPREHENSIVE METABOLIC PANEL  PROTIME-INR  MAGNESIUM    EKG None  Radiology VAS [place] ABI WITH/WO TBI  Result Date: 01/31/2022  LOWER EXTREMITY DOPPLER STUDY Patient Name:  ILYAN Seneca  Date of Exam:   01/31/2022 Medical Rec #: [id number]      Accession #:    [id number] Date of Birth: 18-Feb-1966      Patient Gender: M Patient Age:   23 years Exam Location:  [facility] Procedure:      VAS [place] ABI WITH/WO TBI Referring Phys: BRITNI HENDERLY --------------------------------------------------------------------------------  Indications: Gangrene. High Risk Factors: Hypertension, hyperlipidemia, Diabetes, current smoker, prior                    CVA. Other Factors: AICD, pacemaker.  Comparison Study: No prior study Performing Technologist: Sharion Dove RVS  Examination Guidelines: A complete evaluation includes at minimum, Doppler waveform signals and systolic blood pressure reading at the level of bilateral brachial, anterior tibial, and posterior tibial arteries, when vessel  segments are accessible. Bilateral testing is considered an integral part of a complete examination. Photoelectric Plethysmograph (PPG) waveforms and toe systolic pressure readings are included as required and additional duplex testing as needed. Limited examinations for reoccurring indications may be performed as noted.  ABI Findings: +---------+------------------+-----+-------------------+--------+ Right    Rt Pressure (mmHg)IndexWaveform           Comment  +---------+------------------+-----+-------------------+--------+ Brachial 147                    triphasic                   +---------+------------------+-----+-------------------+--------+  PTA      69                0.47 dampened monophasic         +---------+------------------+-----+-------------------+--------+ DP       81                0.55 dampened monophasic         +---------+------------------+-----+-------------------+--------+ Great Toe                       Absent                      +---------+------------------+-----+-------------------+--------+ +---------+------------------+-----+----------+-------+ Left     Lt Pressure (mmHg)IndexWaveform  Comment +---------+------------------+-----+----------+-------+ Brachial 142                    triphasic         +---------+------------------+-----+----------+-------+ PTA      254               1.73 monophasic        +---------+------------------+-----+----------+-------+ DP       254               1.73 monophasic        +---------+------------------+-----+----------+-------+ Great Toe69                0.47                   +---------+------------------+-----+----------+-------+ +-------+---------------------+-----------+------------+------------+ ABI/TBIToday's ABI          Today's TBIPrevious ABIPrevious TBI +-------+---------------------+-----------+------------+------------+ Right  0.55                 absent                               +-------+---------------------+-----------+------------+------------+ Left   1.73 non compressible0.47                                +-------+---------------------+-----------+------------+------------+  Summary: Right: Resting right ankle-brachial index indicates moderate right lower extremity arterial disease. The right toe-brachial index is abnormal. Left: Resting left ankle-brachial index indicates noncompressible left lower extremity arteries. The left toe-brachial index is abnormal. *See table(s) above for measurements and observations.  Electronically signed by Servando Snare MD on 01/31/2022 at 6:06:54 PM.    Final    DG Foot Complete Right  Result Date: 01/31/2022 CLINICAL DATA:  Stubbed toes, pain EXAM: RIGHT FOOT COMPLETE - 3+ VIEW COMPARISON:  None Available. FINDINGS: No displaced fracture or dislocation is seen. Small bony spurs seen in the interphalangeal joint of big toe. Minimal bony spurs seen in first metatarsophalangeal joint. Bony spurs are noted in the dorsal aspect of intertarsal and tarsometatarsal joints. Arterial calcifications are seen in soft tissues. IMPRESSION: No recent fracture or dislocation is seen. Degenerative changes are noted in multiple joints as described in the body of the report. Electronically Signed   By: Elmer Picker M.D.   On: 01/31/2022 15:16    Procedures Procedures   Medications Ordered in ED Medications  0.9 %  sodium chloride infusion (has no administration in time range)  acetaminophen (TYLENOL) tablet 650 mg (has no administration in time range)    Or  acetaminophen (TYLENOL) suppository 650 mg (has no administration in time range)  oxyCODONE (Oxy IR/ROXICODONE) immediate release tablet 5 mg (has  no administration in time range)  morphine (PF) 2 MG/ML injection 2 mg (has no administration in time range)  ondansetron (ZOFRAN) tablet 4 mg (has no administration in time range)    Or  ondansetron (ZOFRAN) injection 4 mg (has no  administration in time range)  senna-docusate (Senokot-S) tablet 1 tablet (has no administration in time range)  insulin aspart (novoLOG) injection 0-15 Units (has no administration in time range)  insulin aspart (novoLOG) injection 0-5 Units (has no administration in time range)  aspirin tablet 325 mg (has no administration in time range)  atorvastatin (LIPITOR) tablet 80 mg (has no administration in time range)    ED Course/ Medical Decision Making/ A&P                           Medical Decision Making Patient here for right foot pain with necrosis of the right distal first and second toe. He is afebrile and hemodynamically stable. Lactic acid normal. CBC showed anemia and normal WBC. ABI showed moderate arterial disease in the right lower extremity. Right foot xray negative for acute fracture or dislocation. Vascular surgery was consulted. They plan for angiography tomorrow.  Will admit to hospitalist service.   Amount and/or Complexity of Data Reviewed Labs: ordered. Radiology: ordered.  Risk Decision regarding hospitalization.    Final Clinical Impression(s) / ED Diagnoses Final diagnoses:  Lower limb ischemia  Gangrene of toe of right foot [facility])       Angelique Blonder, DO 01/31/22 Mammie Russian, DO 01/31/22 1936    Carmin Muskrat, MD 01/31/22 2240

## 2022-02-01 DIAGNOSIS — E1159 Type 2 diabetes mellitus with other circulatory complications: Secondary | ICD-10-CM | POA: Diagnosis not present

## 2022-02-01 DIAGNOSIS — D638 Anemia in other chronic diseases classified elsewhere: Secondary | ICD-10-CM

## 2022-02-01 DIAGNOSIS — I10 Essential (primary) hypertension: Secondary | ICD-10-CM

## 2022-02-01 DIAGNOSIS — I96 Gangrene, not elsewhere classified: Secondary | ICD-10-CM | POA: Diagnosis not present

## 2022-02-01 LAB — PROTIME-INR
INR: 1 (ref 0.8–1.2)
Prothrombin Time: 13.1 seconds (ref 11.4–15.2)

## 2022-02-01 LAB — CBC
HCT: 30.2 % — ABNORMAL LOW (ref 39.0–52.0)
Hemoglobin: 9.8 g/dL — ABNORMAL LOW (ref 13.0–17.0)
MCH: 30.1 pg (ref 26.0–34.0)
MCHC: 32.5 g/dL (ref 30.0–36.0)
MCV: 92.6 fL (ref 80.0–100.0)
Platelets: 310 10*3/uL (ref 150–400)
RBC: 3.26 MIL/uL — ABNORMAL LOW (ref 4.22–5.81)
RDW: 13.3 % (ref 11.5–15.5)
WBC: 8 10*3/uL (ref 4.0–10.5)
nRBC: 0 % (ref 0.0–0.2)

## 2022-02-01 LAB — COMPREHENSIVE METABOLIC PANEL
ALT: 9 U/L (ref 0–44)
AST: 10 U/L — ABNORMAL LOW (ref 15–41)
Albumin: 2.9 g/dL — ABNORMAL LOW (ref 3.5–5.0)
Alkaline Phosphatase: 70 U/L (ref 38–126)
Anion gap: 10 (ref 5–15)
BUN: 22 mg/dL — ABNORMAL HIGH (ref 6–20)
CO2: 25 mmol/L (ref 22–32)
Calcium: 9.2 mg/dL (ref 8.9–10.3)
Chloride: 106 mmol/L (ref 98–111)
Creatinine, Ser: 1.1 mg/dL (ref 0.61–1.24)
GFR, Estimated: >60 mL/min (ref >60)
Glucose, Bld: 113 mg/dL — ABNORMAL HIGH (ref 70–99)
Potassium: 3.9 mmol/L (ref 3.5–5.1)
Sodium: 141 mmol/L (ref 135–145)
Total Bilirubin: 0.4 mg/dL (ref 0.3–1.2)
Total Protein: 6.2 g/dL — ABNORMAL LOW (ref 6.5–8.1)

## 2022-02-01 LAB — CBG MONITORING, ED
Glucose-Capillary: 106 mg/dL — ABNORMAL HIGH (ref 70–99)
Glucose-Capillary: 178 mg/dL — ABNORMAL HIGH (ref 70–99)
Glucose-Capillary: 95 mg/dL (ref 70–99)

## 2022-02-01 LAB — MAGNESIUM: Magnesium: 1.5 mg/dL — ABNORMAL LOW (ref 1.7–2.4)

## 2022-02-01 LAB — GLUCOSE, CAPILLARY: Glucose-Capillary: 122 mg/dL — ABNORMAL HIGH (ref 70–99)

## 2022-02-01 MED ORDER — DUTASTERIDE 0.5 MG PO CAPS
0.5000 mg | ORAL_CAPSULE | Freq: Every day | ORAL | Status: DC
  Administered 2022-02-01 – 2022-02-07 (×6): 0.5 mg via ORAL
  Filled 2022-02-01 (×8): qty 1

## 2022-02-01 MED ORDER — TAMSULOSIN HCL 0.4 MG PO CAPS
0.8000 mg | ORAL_CAPSULE | Freq: Every day | ORAL | Status: DC
  Administered 2022-02-01 – 2022-02-07 (×7): 0.8 mg via ORAL
  Filled 2022-02-01 (×9): qty 2

## 2022-02-01 MED ORDER — CARVEDILOL 6.25 MG PO TABS
6.2500 mg | ORAL_TABLET | Freq: Two times a day (BID) | ORAL | Status: DC
  Administered 2022-02-01 – 2022-02-03 (×5): 6.25 mg via ORAL
  Filled 2022-02-01: qty 2
  Filled 2022-02-01 (×2): qty 1
  Filled 2022-02-01: qty 2
  Filled 2022-02-01: qty 1

## 2022-02-01 MED ORDER — MAGNESIUM SULFATE 2 GM/50ML IV SOLN
2.0000 g | Freq: Once | INTRAVENOUS | Status: AC
  Administered 2022-02-01: 2 g via INTRAVENOUS
  Filled 2022-02-01: qty 50

## 2022-02-01 NOTE — Progress Notes (Signed)
PROGRESS NOTE    Gatlin Kittell  ZOX:[id number] DOB: 1966/03/21 DOA: 01/31/2022 PCP: [name], NP   Brief Narrative:  56 y.o. 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  On presentation, he was found to have right foot necrotic first 2 toes. Lower extremity ABI showed moderate right lower extremity arterial disease.  Right foot x-ray showed no recent fracture or dislocation.  Vascular surgery was consulted.  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  ABI shows moderate disease in right lower extremity. -Vascular surgery planning for possible angiography tomorrow: Continue aspirin and Lipitor as per vascular surgery recommendations.  Diabetes mellitus type 2 with hyperglycemia -Continue CBGs with SSI.  Might have to resume long-acting insulin if blood sugars continue to remain elevated.   Hypertension -Monitor blood pressure.  Resume Coreg.  We will also resume hydrochlorothiazide if blood pressure continues to remain elevated.    History of unspecified CVA with residual left hemiplegia -Currently stable.  Outpatient follow-up with neurology.  Continue aspirin and statin.  Hypomagnesemia -Replace.  Repeat a.m. labs  Normocytic anemia -Possibly anemia of chronic disease from chronic illnesses -Monitor intermittently   DVT prophylaxis: SCDs.  Hold Lovenox in case patient undergoes vascular intervention Code Status: Full Family  Communication: None at bedside Disposition Plan: Status is: Inpatient Remains inpatient appropriate because: Of need for vascular intervention    Consultants: Vascular surgery  Procedures: None  Antimicrobials: None   Subjective: Patient seen and examined at bedside.  Poor historian.  No fever, agitation, vomiting reported.  Objective: Vitals:   02/01/22 0119 02/01/22 0200 02/01/22 0500 02/01/22 0511  BP:  (!) 168/84 (!) 152/76   Pulse:  74 71   Resp:  (!) 21 16   Temp: 98.3 F (36.8 C)   98.1 F (36.7 C)  TempSrc: Oral   Oral  SpO2:  97% 98%     Intake/Output Summary (Last 24 hours) at 02/01/2022 0751 Last data filed at 02/01/2022 0359 Gross per 24 hour  Intake --  Output 250 ml  Net -250 ml   There were no vitals filed for this visit.  Examination:  General exam: Appears calm and comfortable.  Looks chronically ill and deconditioned.  Currently on room air. Respiratory system: Bilateral decreased breath sounds at bases with some scattered crackles and intermittent dyspnea Cardiovascular system: S1 & S2 heard, Rate controlled Gastrointestinal system: Abdomen is nondistended, soft and nontender. Normal bowel sounds heard. Extremities: No cyanosis, clubbing, edema  Central nervous system: Awake, extremely slow to respond, extremely poor historian.  No focal neurological deficits.  Mild weakness on the left side  skin: Blackish discoloration of first 2 toes with no discharge.  No obvious other petechiae Psychiatry: Extremely flat affect.  No signs of agitation.  Hardly participates in any conversation.   Data Reviewed: I have personally reviewed following labs and imaging studies  CBC: Recent Labs  Lab 01/31/22 1459 02/01/22 0344  WBC 8.7 8.0  NEUTROABS 4.7  --   HGB 10.6* 9.8*  HCT 32.0* 30.2*  MCV 91.7 92.6  PLT 346 310  Basic Metabolic Panel: Recent Labs  Lab 01/31/22 1459 02/01/22 0344  NA 143 141  K 4.0 3.9  CL 106 106  CO2 26 25  GLUCOSE  101* 113*  BUN 18 22*  CREATININE 1.08 1.10  CALCIUM 9.3 9.2  MG  --  1.5*   GFR: CrCl cannot be calculated (Unknown ideal weight.). Liver Function Tests: Recent Labs  Lab 02/01/22 0344  AST 10*  ALT 9  ALKPHOS 70  BILITOT 0.4  PROT 6.2*  ALBUMIN 2.9*   No results for input(s): "LIPASE", "AMYLASE" in the last 168 hours. No results for input(s): "AMMONIA" in the last 168 hours. Coagulation Profile: Recent Labs  Lab 02/01/22 0344  INR 1.0   Cardiac Enzymes: No results for input(s): "CKTOTAL", "CKMB", "CKMBINDEX", "TROPONINI" in the last 168 hours. BNP (last 3 results) No results for input(s): "PROBNP" in the last 8760 hours. HbA1C: No results for input(s): "HGBA1C" in the last 72 hours. CBG: Recent Labs  Lab 01/31/22 2127  GLUCAP 104*   Lipid Profile: No results for input(s): "CHOL", "HDL", "LDLCALC", "TRIG", "CHOLHDL", "LDLDIRECT" in the last 72 hours. Thyroid Function Tests: No results for input(s): "TSH", "T4TOTAL", "FREET4", "T3FREE", "THYROIDAB" in the last 72 hours. Anemia Panel: No results for input(s): "VITAMINB12", "FOLATE", "FERRITIN", "TIBC", "IRON", "RETICCTPCT" in the last 72 hours. Sepsis Labs: Recent Labs  Lab 01/31/22 1459  LATICACIDVEN 1.6    No results found for this or any previous visit (from the past 240 hour(s)).       Radiology Studies: VAS [place] ABI WITH/WO TBI  Result Date: 01/31/2022  LOWER EXTREMITY DOPPLER STUDY Patient Name:  GEORGIA Romanoff  Date of Exam:   01/31/2022 Medical Rec #: [id number]      Accession #:    [id number] Date of Birth: [birth year]      Patient Gender: M Patient Age:   55 years Exam Location:  [facility] Procedure:      VAS [place] ABI WITH/WO TBI Referring Phys: BRITNI HENDERLY --------------------------------------------------------------------------------  Indications: Gangrene. High Risk Factors: Hypertension, hyperlipidemia, Diabetes, current smoker, prior                    CVA. Other Factors: AICD,  pacemaker.  Comparison Study: No prior study Performing Technologist: Sherren Kerns RVS  Examination Guidelines: A complete evaluation includes at minimum, Doppler waveform signals and systolic blood pressure reading at the level of bilateral brachial, anterior tibial, and posterior tibial arteries, when vessel segments are accessible. Bilateral testing is considered an integral part of a complete examination. Photoelectric Plethysmograph (PPG) waveforms and toe systolic pressure readings are included as required and additional duplex testing as needed. Limited examinations for reoccurring indications may be performed as noted.  ABI Findings: +---------+------------------+-----+-------------------+--------+ Right    Rt Pressure (mmHg)IndexWaveform           Comment  +---------+------------------+-----+-------------------+--------+ Brachial 147                    triphasic                   +---------+------------------+-----+-------------------+--------+ PTA      69                0.47 dampened monophasic         +---------+------------------+-----+-------------------+--------+ DP       81                0.55 dampened monophasic         +---------+------------------+-----+-------------------+--------+ [place]  Toe                       Absent                      +---------+------------------+-----+-------------------+--------+ +---------+------------------+-----+----------+-------+ Left     Lt Pressure (mmHg)IndexWaveform  Comment +---------+------------------+-----+----------+-------+ Brachial 142                    triphasic         +---------+------------------+-----+----------+-------+ PTA      254               1.73 monophasic        +---------+------------------+-----+----------+-------+ DP       254               1.73 monophasic        +---------+------------------+-----+----------+-------+ Great Toe69                0.47                    +---------+------------------+-----+----------+-------+ +-------+---------------------+-----------+------------+------------+ ABI/TBIToday's ABI          Today's TBIPrevious ABIPrevious TBI +-------+---------------------+-----------+------------+------------+ Right  0.55                 absent                              +-------+---------------------+-----------+------------+------------+ Left   1.73 non compressible0.47                                +-------+---------------------+-----------+------------+------------+  Summary: Right: Resting right ankle-brachial index indicates moderate right lower extremity arterial disease. The right toe-brachial index is abnormal. Left: Resting left ankle-brachial index indicates noncompressible left lower extremity arteries. The left toe-brachial index is abnormal. *See table(s) above for measurements and observations.  Electronically signed by Lemar Livings MD on 01/31/2022 at 6:06:54 PM.    Final    DG Foot Complete Right  Result Date: 01/31/2022 CLINICAL DATA:  Stubbed toes, pain EXAM: RIGHT FOOT COMPLETE - 3+ VIEW COMPARISON:  None Available. FINDINGS: No displaced fracture or dislocation is seen. Small bony spurs seen in the interphalangeal joint of big toe. Minimal bony spurs seen in first metatarsophalangeal joint. Bony spurs are noted in the dorsal aspect of intertarsal and tarsometatarsal joints. Arterial calcifications are seen in soft tissues. IMPRESSION: No recent fracture or dislocation is seen. Degenerative changes are noted in multiple joints as described in the body of the report. Electronically Signed   By: Ernie Avena M.D.   On: 01/31/2022 15:16        Scheduled Meds:  aspirin  325 mg Oral Daily   atorvastatin  80 mg Oral QHS   carvedilol  6.25 mg Oral BID WC   dutasteride  0.5 mg Oral Daily   insulin aspart  0-15 Units Subcutaneous TID WC   insulin aspart  0-5 Units Subcutaneous QHS   tamsulosin  0.8 mg Oral QPC  supper   Continuous Infusions:  sodium chloride 75 mL/hr at 01/31/22 2147          Glade Lloyd, MD [facility] 02/01/2022, 7:51 AM

## 2022-02-01 NOTE — Progress Notes (Signed)
  Progress Note    02/01/2022 11:51 AM * No surgery date entered *  Subjective: No new issues  Vitals:   02/01/22 1000 02/01/22 1050  BP: (!) 159/89   Pulse: 64 69  Resp: 17 17  Temp:    SpO2: 100% 100%    Physical Exam: Awake alert oriented Nonlabored respirations Stable right first and second toe gangrene  CBC    Component Value Date/Time   WBC 8.0 02/01/2022 0344   RBC 3.26 (L) 02/01/2022 0344   HGB 9.8 (L) 02/01/2022 0344   HGB 15.3 12/08/2021 1102   HCT 30.2 (L) 02/01/2022 0344   HCT 46.8 12/08/2021 1102   PLT 310 02/01/2022 0344   PLT 307 12/08/2021 1102   MCV 92.6 02/01/2022 0344   MCV 91 12/08/2021 1102   MCH 30.1 02/01/2022 0344   MCHC 32.5 02/01/2022 0344   RDW 13.3 02/01/2022 0344   RDW 13.4 12/08/2021 1102   LYMPHSABS 3.3 01/31/2022 1459   LYMPHSABS 1.9 12/08/2021 1102   MONOABS 0.5 01/31/2022 1459   EOSABS 0.1 01/31/2022 1459   EOSABS 0.0 12/08/2021 1102   BASOSABS 0.0 01/31/2022 1459   BASOSABS 0.0 12/08/2021 1102    BMET    Component Value Date/Time   NA 141 02/01/2022 0344   NA 138 12/08/2021 1102   K 3.9 02/01/2022 0344   CL 106 02/01/2022 0344   CO2 25 02/01/2022 0344   GLUCOSE 113 (H) 02/01/2022 0344   BUN 22 (H) 02/01/2022 0344   BUN 14 12/08/2021 1102   CREATININE 1.10 02/01/2022 0344   CALCIUM 9.2 02/01/2022 0344   GFRNONAA >60 02/01/2022 0344   GFRAA >60 03/17/2019 0839    INR    Component Value Date/Time   INR 1.0 02/01/2022 0344     Intake/Output Summary (Last 24 hours) at 02/01/2022 1151 Last data filed at 02/01/2022 0943 Gross per 24 hour  Intake 856.27 ml  Output 250 ml  Net 606.27 ml     Assessment/plan:  56 y.o. 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  Plan for angiography from left common femoral approach tomorrow in the Cath Lab.  He will need to be n.p.o. past midnight.   Fawzi Melman C. Donzetta Matters, MD [facility] Office:  [phone] Pager: [phone]  02/01/2022 11:51 AM

## 2022-02-01 NOTE — ED Notes (Signed)
Pt refused to be repositioned in the bed. Breakfast tray was offered, " pt stated he wasn't hungry." Pt denies pain, and resting in bed. Call light in reach.

## 2022-02-01 NOTE — ED Notes (Signed)
Family at bedside. Requesting update from MD. MD notified.

## 2022-02-02 ENCOUNTER — Encounter (HOSPITAL_COMMUNITY): Payer: Medicaid Other

## 2022-02-02 ENCOUNTER — Encounter (HOSPITAL_COMMUNITY): Payer: Self-pay | Admitting: Vascular Surgery

## 2022-02-02 ENCOUNTER — Encounter (HOSPITAL_COMMUNITY)
Admission: EM | Disposition: A | Discharge status: Skilled Nursing Facility | Payer: Self-pay | Source: Skilled Nursing Facility | Attending: Internal Medicine

## 2022-02-02 DIAGNOSIS — I96 Gangrene, not elsewhere classified: Secondary | ICD-10-CM | POA: Diagnosis not present

## 2022-02-02 DIAGNOSIS — I70235 Atherosclerosis of native arteries of right leg with ulceration of other part of foot: Secondary | ICD-10-CM | POA: Diagnosis not present

## 2022-02-02 HISTORY — PX: ABDOMINAL AORTOGRAM W/LOWER EXTREMITY: CATH118223

## 2022-02-02 LAB — CBC
HCT: 29.3 % — ABNORMAL LOW (ref 39.0–52.0)
Hemoglobin: 10.1 g/dL — ABNORMAL LOW (ref 13.0–17.0)
MCH: 30.4 pg (ref 26.0–34.0)
MCHC: 34.5 g/dL (ref 30.0–36.0)
MCV: 88.3 fL (ref 80.0–100.0)
Platelets: 314 10*3/uL (ref 150–400)
RBC: 3.32 MIL/uL — ABNORMAL LOW (ref 4.22–5.81)
RDW: 13.1 % (ref 11.5–15.5)
WBC: 8.6 10*3/uL (ref 4.0–10.5)
nRBC: 0 % (ref 0.0–0.2)

## 2022-02-02 LAB — GLUCOSE, CAPILLARY
Glucose-Capillary: 129 mg/dL — ABNORMAL HIGH (ref 70–99)
Glucose-Capillary: 110 mg/dL — ABNORMAL HIGH (ref 70–99)
Glucose-Capillary: 251 mg/dL — ABNORMAL HIGH (ref 70–99)
Glucose-Capillary: 179 mg/dL — ABNORMAL HIGH (ref 70–99)

## 2022-02-02 LAB — BASIC METABOLIC PANEL
Anion gap: 4 — ABNORMAL LOW (ref 5–15)
BUN: 26 mg/dL — ABNORMAL HIGH (ref 6–20)
CO2: 31 mmol/L (ref 22–32)
Calcium: 9.5 mg/dL (ref 8.9–10.3)
Chloride: 106 mmol/L (ref 98–111)
Creatinine, Ser: 1.07 mg/dL (ref 0.61–1.24)
GFR, Estimated: >60 mL/min (ref >60)
Glucose, Bld: 143 mg/dL — ABNORMAL HIGH (ref 70–99)
Potassium: 4.5 mmol/L (ref 3.5–5.1)
Sodium: 141 mmol/L (ref 135–145)

## 2022-02-02 LAB — SURGICAL PCR SCREEN
MRSA, PCR: NEGATIVE
Staphylococcus aureus: NEGATIVE

## 2022-02-02 LAB — MAGNESIUM: Magnesium: 1.9 mg/dL (ref 1.7–2.4)

## 2022-02-02 SURGERY — ABDOMINAL AORTOGRAM W/LOWER EXTREMITY
Anesthesia: LOCAL

## 2022-02-02 MED ORDER — FENTANYL CITRATE (PF) 100 MCG/2ML IJ SOLN
INTRAMUSCULAR | Status: DC | PRN
  Administered 2022-02-02: 50 ug via INTRAVENOUS

## 2022-02-02 MED ORDER — OXYCODONE HCL 5 MG PO TABS
5.0000 mg | ORAL_TABLET | ORAL | Status: DC | PRN
  Administered 2022-02-04: 5 mg via ORAL
  Administered 2022-02-04 – 2022-02-06 (×3): 10 mg via ORAL
  Filled 2022-02-02 (×3): qty 2
  Filled 2022-02-02: qty 1

## 2022-02-02 MED ORDER — HYDRALAZINE HCL 20 MG/ML IJ SOLN: 5.0000 mg | INTRAMUSCULAR | Status: DC | PRN

## 2022-02-02 MED ORDER — SODIUM CHLORIDE 0.9 % IV SOLN: INTRAVENOUS | Status: DC

## 2022-02-02 MED ORDER — HEPARIN (PORCINE) IN NACL 1000-0.9 UT/500ML-% IV SOLN
INTRAVENOUS | Status: AC
  Filled 2022-02-02: qty 500

## 2022-02-02 MED ORDER — LABETALOL HCL 5 MG/ML IV SOLN: 10.0000 mg | INTRAVENOUS | Status: DC | PRN

## 2022-02-02 MED ORDER — ONDANSETRON HCL 4 MG/2ML IJ SOLN: 4.0000 mg | Freq: Four times a day (QID) | INTRAMUSCULAR | Status: DC | PRN

## 2022-02-02 MED ORDER — SODIUM CHLORIDE 0.9 % WEIGHT BASED INFUSION: 1.0000 mL/kg/h | INTRAVENOUS | Status: AC

## 2022-02-02 MED ORDER — LIDOCAINE HCL (PF) 1 % IJ SOLN
INTRAMUSCULAR | Status: AC
  Filled 2022-02-02: qty 30

## 2022-02-02 MED ORDER — ENOXAPARIN SODIUM 40 MG/0.4ML IJ SOSY
40.0000 mg | PREFILLED_SYRINGE | INTRAMUSCULAR | Status: DC
  Administered 2022-02-03 – 2022-02-07 (×4): 40 mg via SUBCUTANEOUS
  Filled 2022-02-02 (×4): qty 0.4

## 2022-02-02 MED ORDER — IODIXANOL 320 MG/ML IV SOLN
INTRAVENOUS | Status: DC | PRN
  Administered 2022-02-02: 100 mL

## 2022-02-02 MED ORDER — FENTANYL CITRATE (PF) 100 MCG/2ML IJ SOLN
INTRAMUSCULAR | Status: AC
  Filled 2022-02-02: qty 2

## 2022-02-02 MED ORDER — LIDOCAINE HCL (PF) 1 % IJ SOLN
INTRAMUSCULAR | Status: DC | PRN
  Administered 2022-02-02: 15 mL

## 2022-02-02 MED ORDER — ACETAMINOPHEN 325 MG PO TABS: 650.0000 mg | ORAL_TABLET | ORAL | Status: DC | PRN

## 2022-02-02 MED ORDER — MIDAZOLAM HCL 2 MG/2ML IJ SOLN
INTRAMUSCULAR | Status: AC
  Filled 2022-02-02: qty 2

## 2022-02-02 MED ORDER — NICOTINE POLACRILEX 2 MG MT GUM
2.0000 mg | CHEWING_GUM | OROMUCOSAL | Status: DC | PRN
  Filled 2022-02-02: qty 1

## 2022-02-02 MED ORDER — SODIUM CHLORIDE 0.9% FLUSH
3.0000 mL | Freq: Two times a day (BID) | INTRAVENOUS | Status: DC
  Administered 2022-02-02 – 2022-02-07 (×10): 3 mL via INTRAVENOUS

## 2022-02-02 MED ORDER — SODIUM CHLORIDE 0.9% FLUSH: 3.0000 mL | INTRAVENOUS | Status: DC | PRN

## 2022-02-02 MED ORDER — MIDAZOLAM HCL 2 MG/2ML IJ SOLN
INTRAMUSCULAR | Status: DC | PRN
  Administered 2022-02-02: 1 mg via INTRAVENOUS

## 2022-02-02 MED ORDER — SODIUM CHLORIDE 0.9 % IV SOLN: 250.0000 mL | INTRAVENOUS | Status: DC | PRN

## 2022-02-02 MED ORDER — HEPARIN (PORCINE) IN NACL 1000-0.9 UT/500ML-% IV SOLN
INTRAVENOUS | Status: DC | PRN
  Administered 2022-02-02 (×2): 500 mL

## 2022-02-02 SURGICAL SUPPLY — 9 items
CATH OMNI FLUSH 5F 65CM (CATHETERS) IMPLANT
DEVICE CLOSURE MYNXGRIP 5F (Vascular Products) IMPLANT
KIT MICROPUNCTURE NIT STIFF (SHEATH) IMPLANT
KIT PV (KITS) ×1 IMPLANT
SHEATH PINNACLE 5F 10CM (SHEATH) IMPLANT
SYR MEDRAD MARK V 150ML (SYRINGE) IMPLANT
TRANSDUCER W/STOPCOCK (MISCELLANEOUS) ×1 IMPLANT
TRAY PV CATH (CUSTOM PROCEDURE TRAY) ×1 IMPLANT
WIRE BENTSON .035X145CM (WIRE) IMPLANT

## 2022-02-02 NOTE — Progress Notes (Signed)
Pt alert and oriented to self and person. Disoriented to time, place and situation. Reoriented that we has in the hospital but thinks he is at SNF wants to go to courtyard to smoke. Offered to call provider for nicotine gum. Pt wanted to go to bathroom, got up and urinated in the trash can and on the door, he doesn't seem to be aware of what he did. Pt with incontinence of stool and refuses to be cleaned for a long time. Eventually agreed after clean brief was offered. He wants to go out in the hallway. Reminded it was middle of the night and it was safer to stay in his room. Pt standing in doorway of room and moving between bed and door. Not aggressive to staff, just doesn't want to be here and wants to go home. He knows his foot needs to be seen by doctor but he doesn't understand what is going to happen. He doesn't remember talking to Doctor about it and not able to sign consent at this time. Will continue to monitor for safety

## 2022-02-02 NOTE — Progress Notes (Signed)
Niece Willey Blade came to front desk and dropped off patient's HCPOA form. A copy was made an placed into pt chart. She states she is the primary contact for pt's plan of care as per the POA form.

## 2022-02-02 NOTE — Progress Notes (Signed)
Patient being transported down to cath lab for angiography this morning by transportation staff.

## 2022-02-02 NOTE — Progress Notes (Signed)
  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  CBC    Component Value Date/Time   WBC 8.0 02/01/2022 0344   RBC 3.26 (L) 02/01/2022 0344   HGB 9.8 (L) 02/01/2022 0344   HGB 15.3 12/08/2021 1102   HCT 30.2 (L) 02/01/2022 0344   HCT 46.8 12/08/2021 1102   PLT 310 02/01/2022 0344   PLT 307 12/08/2021 1102   MCV 92.6 02/01/2022 0344   MCV 91 12/08/2021 1102   MCH 30.1 02/01/2022 0344   MCHC 32.5 02/01/2022 0344   RDW 13.3 02/01/2022 0344   RDW 13.4 12/08/2021 1102   LYMPHSABS 3.3 01/31/2022 1459   LYMPHSABS 1.9 12/08/2021 1102   MONOABS 0.5 01/31/2022 1459   EOSABS 0.1 01/31/2022 1459   EOSABS 0.0 12/08/2021 1102   BASOSABS 0.0 01/31/2022 1459   BASOSABS 0.0 12/08/2021 1102    BMET    Component Value Date/Time   NA 141 02/02/2022 0358   NA 138 12/08/2021 1102   K 4.5 02/02/2022 0358   CL 106 02/02/2022 0358   CO2 31 02/02/2022 0358   GLUCOSE 143 (H) 02/02/2022 0358   BUN 26 (H) 02/02/2022 0358   BUN 14 12/08/2021 1102   CREATININE 1.07 02/02/2022 0358   CALCIUM 9.5 02/02/2022 0358   GFRNONAA >60 02/02/2022 0358   GFRAA >60 03/17/2019 0839    INR    Component Value Date/Time   INR 1.0 02/01/2022 0344     Intake/Output Summary (Last 24 hours) at 02/02/2022 0931 Last data filed at 02/02/2022 6237 Gross per 24 hour  Intake 874.23 ml  Output --  Net 874.23 ml      Assessment/plan:  56 y.o. 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    Has history of unspecified stroke with left-sided hemiplegia and aphasia.  Currently residing at [facility].  I called the SNF to inquire about his ambulatory status -Ryan Hughes he is able to  use the restroom independently.  On exam, Ryan Hughes interacted very little.  I was told by nursing that his family helps with decision-making.  I called his niece Ryan Hughes, who is aware of his current clinical condition as well as hospitalization.  She was bedside at the hospital yesterday and understood that he was scheduled for procedure to assess his blood vessels today.  I discussed the risks and benefits with her regarding right lower extremity angiogram with possible intervention.  She is aware I will not know the prognosis of limb salvage until images are taken.  After discussing the risk and benefits, Ryan Hughes elected to proceed.  I spoke with Ryan Hughes who nodded his head 'yes' as well.    Ryan John MD [facility] [place] Office: [phone] Pager: [phone]  02/02/2022 9:31 AM

## 2022-02-02 NOTE — Care Management (Signed)
  Transition of Care (TOC) Screening Note   Patient Details  Name: Ryan Hughes Date of Birth: [birth year]   Transition [facility] Care [facility]) CM/SW Contact:    Carles Collet, RN Phone Number: 02/02/2022, 8:11 AM    Transition of Care Department [facility]) has reviewed patient and we will continue to monitor patient advancement through interdisciplinary progression rounds. If new patient transition needs arise, please place a TOC consult.  TOC will follow. Patient admitted from [place]

## 2022-02-02 NOTE — Progress Notes (Signed)
[facility] Progress Note  Patient: Ryan Hughes     ZSW:[id number]  DOA: 01/31/2022   PCP: Grayce Sessions, NP       Brief hospital course:   Ryan Hughes is a 56 y.o. male with medical history significant of diabetes mellitus type 2, hypertension, prior unspecified stroke with left-sided hemiplegia and aphasia,who is wheelchair bound and had a recent admission from 12/14/2021-12/20/2021 for hyperglycemic hyperosmolar state with intractable nausea and vomiting and acute on chronic urinary retention requiring Foley catheter placement presented from [facility] with complaints of increasing right foot pain.   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  Subjective:  Having pain in right foot.  Assessment and Plan: Principal Problem:   Gangrene (HCC) - s/p angiogram today- extensive vascular disease - needs amputation- patient aware and is considering it- Niece at bedside, [name] is the POA - cont lipitor and ASA 325 mg  Active Problems:   HTN (hypertension) - Carvedilol    Type 2 diabetes mellitus with vascular disease (HCC) - hold Glucotrol, Dulaglutide and Lantus - cont SSI - A1c 13.9 in 12/08/21    Hypomagnesemia - replaced    Normocytic anemia - follow PRN  Disposition: Lives at [facility] - plans are for him to go to his niece's home on 9/30   DVT prophylaxis:  enoxaparin (LOVENOX) injection 40 mg Start: 02/03/22 0800 SCDs Start: 01/31/22 1745     Code Status: Full Code  Consultants: vascular surgery Level of Care: Level of care: Progressive Cardiac Objective:   Vitals:   02/02/22 1125 02/02/22 1140 02/02/22 1200 02/02/22 1218  BP: (!) 172/87 (!) 164/88  (!) 153/96  Pulse: 73 75 76 76  Resp: 14 13 12 16   Temp:    98.6 F (37 C)  TempSrc:    Oral  SpO2: 98% 100% 100% 99%  Weight:    78.3 kg  Height:    5\' 2"  (1.575 m)   Filed Weights   02/01/22 1000 02/02/22 0500 02/02/22 1218  Weight: 79.4 kg 85 kg 78.3 kg   Exam: General  exam: Appears comfortable  HEENT: oral mucosa moist Respiratory system: Clear to auscultation.  Cardiovascular system: S1 & S2 heard  Gastrointestinal system: Abdomen soft, non-tender, nondistended. Normal bowel sounds   Extremities: No cyanosis, clubbing or edema- gangrene of 1st and 2nd toe - has tenderness to palpation in forefoot Psychiatry:  Mood & affect appropriate.    Imaging and lab data was personally reviewed    CBC: Recent Labs  Lab 01/31/22 1459 02/01/22 0344 02/02/22 0358  WBC 8.7 8.0 8.6  NEUTROABS 4.7  --   --   HGB 10.6* 9.8* 10.1*  HCT 32.0* 30.2* 29.3*  MCV 91.7 92.6 88.3  PLT 346 310 314   Basic Metabolic Panel: Recent Labs  Lab 01/31/22 1459 02/01/22 0344 02/02/22 0358  NA 143 141 141  K 4.0 3.9 4.5  CL 106 106 106  CO2 26 25 31   GLUCOSE 101* 113* 143*  BUN 18 22* 26*  CREATININE 1.08 1.10 1.07  CALCIUM 9.3 9.2 9.5  MG  --  1.5* 1.9   GFR: Estimated Creatinine Clearance: 69.1 mL/min (by C-G formula based on SCr of 1.07 mg/dL).  Scheduled Meds:  aspirin  325 mg Oral Daily   atorvastatin  80 mg Oral QHS   carvedilol  6.25 mg Oral BID WC   dutasteride  0.5 mg Oral Daily   [START ON 02/03/2022] enoxaparin (LOVENOX) injection  40 mg Subcutaneous Q24H   insulin aspart  0-15 Units Subcutaneous TID WC   insulin aspart  0-5 Units Subcutaneous QHS   sodium chloride flush  3 mL Intravenous Q12H   tamsulosin  0.8 mg Oral QPC supper   Continuous Infusions:  sodium chloride     sodium chloride 1 mL/kg/hr (02/02/22 1038)     LOS: 2 days   Author: Debbe Odea  02/02/2022 4:53 PM

## 2022-02-02 NOTE — Progress Notes (Signed)
Pt disconnected self from Telemetry and keeps pulling on IV line indicating he doesn't want to be connected. Stopped for now and Tele on hold until pt calms down. Educated and redirected without success at this time.

## 2022-02-02 NOTE — Op Note (Signed)
    Patient name: Ryan Hughes MRN: [id number] DOB: [birth year] Sex: male  02/02/2022 Pre-operative Diagnosis: Right lower extremity critical limb ischemia with tissue loss Post-operative diagnosis:  Same Surgeon:  Broadus John, MD Procedure Performed: 1.  Ultrasound-guided micropuncture access of the left common femoral artery 2.  Aortogram 3.  Second order cannulation, right lower extremity angiogram 4.  Device assisted closure-Mynx 5.  Moderate sedation time 20 minutes 6.  Contrast volume-100 ml 7.   8.     Indications: Patient is a 56 year old male with peripheral arterial disease who presented with wounds on his right foot and a nonpalpable pulse.  ABIs demonstrated monophasic waveforms at the level of the ankle with no toe pressure appreciated.  After discussing the risk and benefits of right lower extremity angiogram in an effort to define and possibly improve distal perfusion for wound healing, Ryan Hughes and his niece elected to proceed  Findings:  Aortogram: Bilateral renal arteries patent, no flow-limiting stenosis appreciated in the aortoiliac system Right lower extremity angiogram: No flow-limiting stenosis appreciated in the common femoral artery.  Profunda widely patent with calcific disease appreciated within its distal branches.  Superficial femoral artery with multiple, tandem, flow-limiting stenoses appreciated throughout its length.  Several areas of near-occlusion.  It appears to reconstitute via diffuse collaterals in islands.  The popliteal artery is initially patent, terminating abruptly in the P2 segment into diffuse collaterals.  No named vessels in the calf.  There appears to be a small, atraumatic peroneal artery that fills at the distal tibia for a short segment.  Runoff into the foot all collaterals, no named vessels.   Procedure:  The patient was identified in the holding area and taken to room 8.  The patient was then placed supine on the table and prepped and  draped in the usual sterile fashion.  A time out was called.  Ultrasound was used to evaluate the left common femoral artery.  It was patent .  A digital ultrasound image was acquired.  A micropuncture needle was used to access the left common femoral artery under ultrasound guidance.  An 018 wire was advanced without resistance and a micropuncture sheath was placed.  The 018 wire was removed and a benson wire was placed.  The micropuncture sheath was exchanged for a 5 french sheath.  An omniflush catheter was advanced over the wire to the level of L-1.  An abdominal angiogram was obtained.  Next, using the omniflush catheter and a benson wire, the aortic bifurcation was crossed and the catheter was placed into theright external iliac artery and right runoff was obtained.   Impression: Unreconstructable vascular disease.  Patient will require amputation.  Will discuss options with family    Cassandria Santee, MD [facility] [place] Office: [phone]

## 2022-02-03 DIAGNOSIS — I70235 Atherosclerosis of native arteries of right leg with ulceration of other part of foot: Secondary | ICD-10-CM | POA: Diagnosis not present

## 2022-02-03 DIAGNOSIS — I96 Gangrene, not elsewhere classified: Secondary | ICD-10-CM | POA: Diagnosis not present

## 2022-02-03 LAB — GLUCOSE, CAPILLARY
Glucose-Capillary: 145 mg/dL — ABNORMAL HIGH (ref 70–99)
Glucose-Capillary: 190 mg/dL — ABNORMAL HIGH (ref 70–99)
Glucose-Capillary: 137 mg/dL — ABNORMAL HIGH (ref 70–99)
Glucose-Capillary: 130 mg/dL — ABNORMAL HIGH (ref 70–99)

## 2022-02-03 LAB — LIPID PANEL
Cholesterol: 114 mg/dL (ref 0–200)
HDL: 28 mg/dL — ABNORMAL LOW (ref >40)
LDL Cholesterol: 72 mg/dL (ref 0–99)
Total CHOL/HDL Ratio: 4.1 RATIO
Triglycerides: 68 mg/dL (ref <150)
VLDL: 14 mg/dL (ref 0–40)

## 2022-02-03 NOTE — TOC Progression Note (Addendum)
Transition of Care (TOC) - Progression Note  Marvetta Gibbons RN, BSN Transitions of Care Unit 4E- RN Case Manager See Treatment Team for direct phone #    Patient Details  Name: Ryan Hughes MRN: [id number] Date of Birth: 01-22-1966  Transition of Care [facility]) CM/SW Contact  Dahlia Client Ryan Rabon, RN Phone Number: 02/03/2022, 4:42 PM  Clinical Narrative:    Received msg from bedside RN that family at bedside wanted to speak with TOC.  CM went to room and spoke with Niece Ryan Hughes regarding transition plan. Per niece pt was at [facility] and plan was for him to transition home on 9/30 and then go under [facility] program on 10/1. Ryan Hughes was to assist in coordinating DME for home w/ [employer] (hospital bed, overbed table, BSC, tub transfer bench, transport chair, RW).  Niece is waiting return call from [facility] to discuss admission Oct 1 vs waiting till Nov. 1. Niece also expressed that she had contacted [facility] SNF regarding possible transfer there and at the time they did express that they would have a bed available for pt. Niece is contemplating having pt return to a facility (would prefer to not have pt return to [facility]) and work with [facility] to admit to services in Nov after having a second opinion done regarding amputation.   Explained to niece that we would have to check with Ryan Hughes to see if they are still willing to offer a bed, and seek insurance auth to see if we could get approval to send pt to [place]. Niece verbalized understanding.  CSW updated and will f/u with Ryan Hughes.     1630- CM reached out to Niece to update that [facility] still willing to offer a bed and has started insurance auth.    Expected Discharge Plan: [place] Barriers to Discharge: Continued Medical Work up  Expected Discharge Plan and Services Expected Discharge Plan: Ryan Hughes In-house Referral: Clinical Social Work     Living arrangements for the past 2  months: [place]                                       Social Determinants of Health (SDOH) Interventions    Readmission Risk Interventions     No data to display

## 2022-02-03 NOTE — Progress Notes (Signed)
[facility] Progress Note  Patient: Ryan Hughes     WOE:[id number]  DOA: 01/31/2022   PCP: Grayce Sessions, NP       Brief hospital course:   Ryan Hughes is a 56 y.o. male with medical history significant of diabetes mellitus type 2, hypertension, prior unspecified stroke with left-sided hemiplegia and aphasia,who is wheelchair bound and had a recent admission from 12/14/2021-12/20/2021 for hyperglycemic hyperosmolar state with intractable nausea and vomiting and acute on chronic urinary retention requiring Foley catheter placement presented from [facility] with complaints of increasing right foot pain.   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  Subjective:  He is not very talkative today. Does not answer more questions. He does admit that he continues to have pain in his right foot. No other complaints. He would not discuss an amputation with me.   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 I have explained to the patient that he can be discharged from the hospital. Lacinda Axon working on insurance auth.   DVT prophylaxis:  enoxaparin (LOVENOX) injection 40 mg Start: 02/03/22 0800 SCDs Start: 01/31/22 1745     Code Status: Full Code  Consultants: vascular surgery Level of Care: Level of care: Progressive Cardiac Objective:   Vitals:   02/03/22 0325 02/03/22 0736 02/03/22 1007 02/03/22 1141  BP:  (!) 121/99 (!) 153/77 (!) 153/85 (!) 145/79  Pulse: 79 81 83 82  Resp: 19 (!) 23 16 (!) 24  Temp: 98.4 F (36.9 C) 98.4 F (36.9 C)  98.8 F (37.1 C)  TempSrc: Oral Oral  Oral  SpO2: 100% 100% 100% 98%  Weight: 79.2 kg     Height:       Filed Weights   02/02/22 0500 02/02/22 1218 02/03/22 0325  Weight: 85 kg 78.3 kg 79.2 kg   Exam: General exam: Appears comfortable  HEENT: oral mucosa moist Respiratory system: Clear to auscultation.  Cardiovascular system: S1 & S2 heard  Gastrointestinal system: Abdomen soft, non-tender, nondistended. Normal bowel sounds   Extremities: gangrene of right 1st and 2nd toes with tenderness in forefoot Psychiatry:  flat affect today   Imaging and lab data was personally reviewed    CBC: Recent Labs  Lab 01/31/22 1459 02/01/22 0344 02/02/22 0358  WBC 8.7 8.0 8.6  NEUTROABS 4.7  --   --   HGB 10.6* 9.8* 10.1*  HCT 32.0* 30.2* 29.3*  MCV 91.7 92.6 88.3  PLT 346 310 314    Basic Metabolic Panel: Recent Labs  Lab 01/31/22 1459 02/01/22 0344 02/02/22 0358  NA 143 141 141  K 4.0 3.9 4.5  CL 106 106 106  CO2 26 25 31   GLUCOSE 101* 113* 143*  BUN 18 22* 26*  CREATININE 1.08 1.10 1.07  CALCIUM 9.3 9.2 9.5  MG  --  1.5* 1.9    GFR: Estimated Creatinine  Clearance: 69.4 mL/min (by C-G formula based on SCr of 1.07 mg/dL).  Scheduled Meds:  aspirin  325 mg Oral Daily   atorvastatin  80 mg Oral QHS   carvedilol  6.25 mg Oral BID WC   dutasteride  0.5 mg Oral Daily   enoxaparin (LOVENOX) injection  40 mg Subcutaneous Q24H   insulin aspart  0-15 Units Subcutaneous TID WC   insulin aspart  0-5 Units Subcutaneous QHS   sodium chloride flush  3 mL Intravenous Q12H   tamsulosin  0.8 mg Oral QPC supper   Continuous Infusions:  sodium chloride       LOS: 3 days   Author: Debbe Odea  02/03/2022 3:56 PM

## 2022-02-03 NOTE — TOC Progression Note (Addendum)
Transition of Care [facility]) - Progression Note    Patient Details  Name: Ryan Hughes MRN: [id number] Date of Birth: 05/16/1965  Transition of Care [facility]) CM/SW [place], [place] Phone Number: 02/03/2022, 12:56 PM  Clinical Narrative:     1:pm-UPDATE: informed by Dr Wynelle Cleveland- SNF can start authorization, CSW sent message to [place].   CSW sent message to [facility] per family request- [facility] confirmed they can accept patient once he is medically stable.  TOC will continue to follow and assist with discharge planning.   Thurmond Butts, MSW, LCSW Clinical Social Worker    Expected Discharge Plan: Skilled Nursing Facility Barriers to Discharge: Continued Medical Work up  Expected Discharge Plan and Services Expected Discharge Plan: [place] In-house Referral: Clinical Social Work     Living arrangements for the past 2 months: [place]                                       Social Determinants of Health (SDOH) Interventions    Readmission Risk Interventions     No data to display

## 2022-02-03 NOTE — Progress Notes (Signed)
PHARMACIST LIPID MONITORING   Ryan Hughes is a 56 y.o. male admitted on 01/31/2022 with critical limb ischemia.  Pharmacy has been consulted to optimize lipid-lowering therapy with the indication of secondary prevention for clinical ASCVD.  Recent Labs:  Lipid Panel (last 6 months):   Lab Results  Component Value Date   CHOL 114 02/03/2022   TRIG 68 02/03/2022   HDL 28 (L) 02/03/2022   CHOLHDL 4.1 02/03/2022   VLDL 14 02/03/2022   LDLCALC 72 02/03/2022    Hepatic function panel (last 6 months):   Lab Results  Component Value Date   AST 10 (L) 02/01/2022   ALT 9 02/01/2022   ALKPHOS 70 02/01/2022   BILITOT 0.4 02/01/2022    SCr (since admission):   Serum creatinine: 1.07 mg/dL 02/02/22 0358 Estimated creatinine clearance: 69.4 mL/min  Current therapy and lipid therapy tolerance Current lipid-lowering therapy: atorvastatin 80 mg  Previous lipid-lowering therapies (if applicable): atorvastatin 40-80 mg Documented or reported allergies or intolerances to lipid-lowering therapies (if applicable): None  Assessment:   Patient prefers no changes in lipid-lowering therapy at this time - discussed considering zetia but he would prefer to continue and monitor levels before adding additional therapy  Plan:    1.Statin intensity (high intensity recommended for all patients regardless of the LDL):  No statin changes. The patient is already on a high intensity statin.  2.Add ezetimibe (if any one of the following):   Not interested in starting at this time.  3.Refer to lipid clinic:   No  4.Follow-up with:  Primary care provider - [name], NP  5.Follow-up labs after discharge:  No changes in lipid therapy, repeat a lipid panel in one year.      Antonietta Jewel, PharmD, [place] Clinical Pharmacist  Phone: [phone] 02/03/2022 7:39 AM  Please check AMION for all [place] phone numbers After 10:00 PM, call [place] [phone]

## 2022-02-03 NOTE — Progress Notes (Addendum)
  Progress Note    02/03/2022 6:47 AM 1 Day Post-Op  Subjective:  no complaints  afebrile  Vitals:   02/02/22 2325 02/03/22 0325  BP: (!) 152/83 (!) 121/99  Pulse: 93 79  Resp: 20 19  Temp: 98.8 F (37.1 C) 98.4 F (36.9 C)  SpO2: 98% 100%    Physical Exam: General:  no distress Lungs:  non labored Incisions:  left groin is soft without hematoma   CBC    Component Value Date/Time   WBC 8.6 02/02/2022 0358   RBC 3.32 (L) 02/02/2022 0358   HGB 10.1 (L) 02/02/2022 0358   HGB 15.3 12/08/2021 1102   HCT 29.3 (L) 02/02/2022 0358   HCT 46.8 12/08/2021 1102   PLT 314 02/02/2022 0358   PLT 307 12/08/2021 1102   MCV 88.3 02/02/2022 0358   MCV 91 12/08/2021 1102   MCH 30.4 02/02/2022 0358   MCHC 34.5 02/02/2022 0358   RDW 13.1 02/02/2022 0358   RDW 13.4 12/08/2021 1102   LYMPHSABS 3.3 01/31/2022 1459   LYMPHSABS 1.9 12/08/2021 1102   MONOABS 0.5 01/31/2022 1459   EOSABS 0.1 01/31/2022 1459   EOSABS 0.0 12/08/2021 1102   BASOSABS 0.0 01/31/2022 1459   BASOSABS 0.0 12/08/2021 1102    BMET    Component Value Date/Time   NA 141 02/02/2022 0358   NA 138 12/08/2021 1102   K 4.5 02/02/2022 0358   CL 106 02/02/2022 0358   CO2 31 02/02/2022 0358   GLUCOSE 143 (H) 02/02/2022 0358   BUN 26 (H) 02/02/2022 0358   BUN 14 12/08/2021 1102   CREATININE 1.07 02/02/2022 0358   CALCIUM 9.5 02/02/2022 0358   GFRNONAA >60 02/02/2022 0358   GFRAA >60 03/17/2019 0839    INR    Component Value Date/Time   INR 1.0 02/01/2022 0344     Intake/Output Summary (Last 24 hours) at 02/03/2022 0647 Last data filed at 02/02/2022 1659 Gross per 24 hour  Intake 539.75 ml  Output 450 ml  Net 89.75 ml     Assessment/Plan:  56 y.o. male is s/p:  Angiogram via left CFA  1 Day Post-Op   -left groin soft without hematoma. -findings on angiogram reveal unreconstructable vascular disease and pt will require amputation.  Dr. Virl Cagey to discuss with pt and family level of  amputation.    Leontine Locket, PA-C [facility] [phone] 02/03/2022 6:47 AM  I have independently interviewed and examined patient and agree with PA assessment and plan above.  Patient with toe ulceration but unfortunately has no revascularization options.  He would require an above-knee amputation at this time no evidence of infection but he is having significant pain.  I discussed this with him today but I am unsure of his level of understanding but he is adamant that at this time he does not want above-knee amputation.  I have attempted to reach out to both of his niece is listed as his contacts to no avail.  Patient could be discharged with plans for outpatient discussion if necessary.  Tynslee Bowlds C. Donzetta Matters, MD [facility] [place] Office: [phone] Pager: [phone]

## 2022-02-04 ENCOUNTER — Inpatient Hospital Stay (HOSPITAL_COMMUNITY): Payer: Medicaid Other

## 2022-02-04 DIAGNOSIS — Z01810 Encounter for preprocedural cardiovascular examination: Secondary | ICD-10-CM

## 2022-02-04 DIAGNOSIS — I70235 Atherosclerosis of native arteries of right leg with ulceration of other part of foot: Secondary | ICD-10-CM | POA: Diagnosis not present

## 2022-02-04 DIAGNOSIS — I96 Gangrene, not elsewhere classified: Secondary | ICD-10-CM | POA: Diagnosis not present

## 2022-02-04 LAB — BASIC METABOLIC PANEL
Anion gap: 7 (ref 5–15)
BUN: 22 mg/dL — ABNORMAL HIGH (ref 6–20)
CO2: 28 mmol/L (ref 22–32)
Calcium: 9 mg/dL (ref 8.9–10.3)
Chloride: 106 mmol/L (ref 98–111)
Creatinine, Ser: 1 mg/dL (ref 0.61–1.24)
GFR, Estimated: >60 mL/min (ref >60)
Glucose, Bld: 154 mg/dL — ABNORMAL HIGH (ref 70–99)
Potassium: 4.2 mmol/L (ref 3.5–5.1)
Sodium: 141 mmol/L (ref 135–145)

## 2022-02-04 LAB — GLUCOSE, CAPILLARY
Glucose-Capillary: 118 mg/dL — ABNORMAL HIGH (ref 70–99)
Glucose-Capillary: 158 mg/dL — ABNORMAL HIGH (ref 70–99)
Glucose-Capillary: 171 mg/dL — ABNORMAL HIGH (ref 70–99)
Glucose-Capillary: 146 mg/dL — ABNORMAL HIGH (ref 70–99)

## 2022-02-04 MED ORDER — CARVEDILOL 12.5 MG PO TABS
12.5000 mg | ORAL_TABLET | Freq: Two times a day (BID) | ORAL | Status: DC
  Administered 2022-02-04 – 2022-02-08 (×8): 12.5 mg via ORAL
  Filled 2022-02-04 (×8): qty 1

## 2022-02-04 NOTE — NC FL2 (Signed)
[place] LEVEL OF CARE SCREENING TOOL     IDENTIFICATION  Patient Name: Ryan Hughes Birthdate: [birth year] Sex: male Admission Date (Current Location): 01/31/2022  [facility] and [place] Number:  Herbalist and Address:         Provider Number: [id number]  Attending Physician Name and Address:  Debbe Odea, MD  Relative Name and Phone Number:       Current Level of Care: Hospital Recommended Level of Care: [place] Prior Approval Number:    Date Approved/Denied:   PASRR Number: [id number]  Discharge Plan: SNF    Current Diagnoses: Patient Active Problem List   Diagnosis Date Noted   Hypomagnesemia 02/01/2022   Anemia of chronic disease 02/01/2022   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   Hyperglycemic hyperosmolar nonketotic coma ([place]) 12/17/2021   Hypokalemia 12/16/2021   Type 2 diabetes mellitus with hyperglycemia ([place]) 12/15/2021   Nausea & vomiting 12/15/2021   Acute CVA (cerebrovascular accident) ([place]) 03/27/2021   Type 2 diabetes mellitus with vascular disease ([place]) 03/27/2021   Intractable nausea and vomiting 08/05/2020   HTN (hypertension) 08/05/2020   Acute arterial ischemic stroke, vertebrobasilar, thalamic, left ([place]) 04/25/2020   Mixed hyperlipidemia due to type 2 diabetes mellitus ([place]) 04/25/2020   Benign prostatic hyperplasia (BPH) with straining on urination 04/25/2020   Nicotine dependence, cigarettes, uncomplicated 69/48/5462   Stroke ([place]) 04/25/2020   Rash 03/19/2013   Urinary hesitancy 03/19/2013   Back spasm 05/16/2012   Foot pain 07/18/2011   Diabetes mellitus ([place]) 06/18/2011   Tingling in extremities 06/18/2011    Orientation RESPIRATION BLADDER Height & Weight     Self, Place  Normal Incontinent, External catheter Weight: 176 lb 12.9 oz (80.2 kg) Height:  5\' 2"  (157.5 cm)  BEHAVIORAL SYMPTOMS/MOOD NEUROLOGICAL BOWEL NUTRITION STATUS      Incontinent Diet (please see discharge summary)  AMBULATORY  STATUS COMMUNICATION OF NEEDS Skin     Verbally Normal                       Personal Care Assistance Level of Assistance  Bathing, Dressing, Feeding Bathing Assistance: Limited assistance Feeding assistance: Independent Dressing Assistance: Limited assistance     Functional Limitations Info  Sight, Hearing, Speech Sight Info: Adequate Hearing Info: Adequate Speech Info: Adequate    SPECIAL CARE FACTORS FREQUENCY  PT (By licensed PT), OT (By licensed OT)     PT Frequency: 5x per week OT Frequency: 5x per week            Contractures Contractures Info: Not present    Additional Factors Info  Code Status, Allergies Code Status Info: FULL Allergies Info: Pork-derived Products,Shellfish-derived           Current Medications (02/04/2022):  This is the current hospital active medication list Current Facility-Administered Medications  Medication Dose Route Frequency Provider Last Rate Last Admin   0.9 %  sodium chloride infusion  250 mL Intravenous PRN Broadus John, MD       acetaminophen (TYLENOL) tablet 650 mg  650 mg Oral Q6H PRN Aline August, MD       Or   acetaminophen (TYLENOL) suppository 650 mg  650 mg Rectal Q6H PRN Aline August, MD       acetaminophen (TYLENOL) tablet 650 mg  650 mg Oral Q4H PRN Broadus John, MD       aspirin tablet 325 mg  325 mg Oral Daily Aline August, MD   325 mg at 02/03/22  1008   atorvastatin (LIPITOR) tablet 80 mg  80 mg Oral QHS Glade Lloyd, MD   80 mg at 02/03/22 2128   carvedilol (COREG) tablet 6.25 mg  6.25 mg Oral BID WC Alekh, Kshitiz, MD   6.25 mg at 02/03/22 1828   dutasteride (AVODART) capsule 0.5 mg  0.5 mg Oral Daily Hanley Ben, Kshitiz, MD   0.5 mg at 02/03/22 1008   enoxaparin (LOVENOX) injection 40 mg  40 mg Subcutaneous Q24H Victorino Sparrow, MD   40 mg at 02/03/22 1009   hydrALAZINE (APRESOLINE) injection 5 mg  5 mg Intravenous Q20 Min PRN Victorino Sparrow, MD       insulin aspart (novoLOG) injection 0-15  Units  0-15 Units Subcutaneous TID WC Glade Lloyd, MD   2 Units at 02/03/22 1829   insulin aspart (novoLOG) injection 0-5 Units  0-5 Units Subcutaneous QHS Alekh, Kshitiz, MD       labetalol (NORMODYNE) injection 10 mg  10 mg Intravenous Q10 min PRN Victorino Sparrow, MD       morphine (PF) 2 MG/ML injection 2 mg  2 mg Intravenous Q2H PRN Glade Lloyd, MD       nicotine polacrilex (NICORETTE) gum 2 mg  2 mg Oral PRN Luiz Iron, NP       ondansetron [place]) tablet 4 mg  4 mg Oral Q6H PRN Glade Lloyd, MD       Or   ondansetron (ZOFRAN) injection 4 mg  4 mg Intravenous Q6H PRN Glade Lloyd, MD       ondansetron (ZOFRAN) injection 4 mg  4 mg Intravenous Q6H PRN Victorino Sparrow, MD       oxyCODONE (Oxy IR/ROXICODONE) immediate release tablet 5-10 mg  5-10 mg Oral Q4H PRN Calvert Cantor, MD   5 mg at 02/04/22 0405   senna-docusate (Senokot-S) tablet 1 tablet  1 tablet Oral QHS PRN Glade Lloyd, MD       sodium chloride flush (NS) 0.9 % injection 3 mL  3 mL Intravenous Q12H Victorino Sparrow, MD   3 mL at 02/03/22 2131   sodium chloride flush (NS) 0.9 % injection 3 mL  3 mL Intravenous PRN Victorino Sparrow, MD       tamsulosin [facility]) capsule 0.8 mg  0.8 mg Oral QPC supper Glade Lloyd, MD   0.8 mg at 02/03/22 4163     Discharge Medications: Please see discharge summary for a list of discharge medications.  Relevant Imaging Results:  Relevant Lab Results:   Additional Information SSN # [id number]  Eduard Roux, LCSW

## 2022-02-04 NOTE — Progress Notes (Signed)
Pt refusing vitals this AM

## 2022-02-04 NOTE — Progress Notes (Addendum)
      Subjective  - refusing amputation and refusing labs.   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 He has declined surgey.   Patient could be discharged with plans for outpatient discussion if necessary.  If family member can be reached Dr. Donzetta Matters would be glad to discuss his options with them.  The patient is not very responsive to verbal communication.  Niece, Len Blalock is the POA.    Roxy Horseman 02/04/2022 7:30 AM --  Laboratory Lab Results: Recent Labs    02/02/22 0358  WBC 8.6  HGB 10.1*  HCT 29.3*  PLT 314   BMET Recent Labs    02/02/22 0358  NA 141  K 4.5  CL 106  CO2 31  GLUCOSE 143*  BUN 26*  CREATININE 1.07  CALCIUM 9.5    COAG Lab Results  Component Value Date   INR 1.0 02/01/2022   INR 0.9 09/16/2021   No results found for: "PTT"  I have independently interviewed and examined patient and agree with PA assessment and plan above. I have discussed with the family and they are hopeful for bypass surgery but I have discussed that this is incredibly high risk and would likely need multiple procedures including bypass surgery with amputations which are still high risk for healing.  Family wants to have further discussion this weekend and I can discuss with them again on Monday.  In the meantime I have ordered vein mapping of the right lower extremity to determine what our vein status is and if we would need a hybrid femoral to popliteal artery bypass with tibial axis angioplasty.  Sherald Balbuena C. Donzetta Matters, MD [facility] Office:  [phone] Pager: [phone]

## 2022-02-04 NOTE — TOC Progression Note (Signed)
Transition of Care [facility]) - Progression Note    Patient Details  Name: Ryan Hughes MRN: [id number] Date of Birth: [birth year]  Transition of Care [facility]) CM/SW [place], [place] Phone Number: 02/04/2022, 12:32 PM  Clinical Narrative:     12:30pm-updated SNF patient will not d/c today  11:30am- received update from MD, discharge in hold- considering surgery and will follow up w/the family on Monday  9:20am-Received message- [facility] has authorization and they can admit patient today-   Expected Discharge Plan: [place] Barriers to Discharge: Continued Medical Work up  Expected Discharge Plan and Services Expected Discharge Plan: [place] In-house Referral: Clinical Social Work     Living arrangements for the past 2 months: [place]                                       Social Determinants of Health (SDOH) Interventions    Readmission Risk Interventions     No data to display

## 2022-02-04 NOTE — Progress Notes (Signed)
[facility] Progress Note  Patient: Ryan Hughes     [id number]  DOA: 01/31/2022   PCP: [name], NP       Brief hospital course:   Ryan Hughes is a 56 y.o. 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  Subjective:  Ongoing pain in left foot. He tells me he is going to receive stents in his left leg.  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 - s/p angiogram 9/27- extensive vascular disease - needs amputation- patient aware and has declined - Family is asking about bypass surgery - vein mapping ordered- further discussions on Monday - Niece, Len Blalock is the POA - cont lipitor and ASA 325 mg - LDL 72, HDL 28  Active Problems:   HTN (hypertension) - Carvedilol- BP remains high- increase from 6.25 to 12.5    Type 2 diabetes mellitus with vascular disease (HCC) - hold Glucotrol, Dulaglutide and Lantus - cont SSI - A1c 13.9 in 12/08/21    Hypomagnesemia - replaced    Normocytic anemia - follow PRN  Ex-smoker - he states he quit smoking about 2 months ago  Disposition: Lives at [facility] - plans are for him to go [place] after dc.     DVT prophylaxis:  enoxaparin (LOVENOX) injection 40 mg Start: 02/03/22 0800 SCDs Start: 01/31/22 1745   Code Status: Full Code  Consultants: vascular surgery Level of Care: Level of care: Med-Surg Objective:   Vitals:   02/04/22 0401 02/04/22 0618 02/04/22 0956 02/04/22 1140  BP: (!) 153/83  (!) 155/78 (!) 157/75  Pulse: 75   73 84  Resp: 20  18 18   Temp: 98.3 F (36.8 C)  98.2 F (36.8 C) 98.5 F (36.9 C)  TempSrc: Oral  Oral Oral  SpO2: 100%  100% 99%  Weight:  80.2 kg    Height:       Filed Weights   02/02/22 1218 02/03/22 0325 02/04/22 0618  Weight: 78.3 kg 79.2 kg 80.2 kg   Exam: General exam: Appears comfortable  HEENT: oral mucosa moist Respiratory system: Clear to auscultation.  Cardiovascular system: S1 & S2 heard  Gastrointestinal system: Abdomen soft, non-tender, nondistended. Normal bowel sounds   Extremities: No cyanosis, clubbing - gangrene of left 1st and 2nd toes     Imaging and lab data was personally reviewed    CBC: Recent Labs  Lab 01/31/22 1459 02/01/22 0344 02/02/22 0358  WBC 8.7 8.0 8.6  NEUTROABS 4.7  --   --   HGB 10.6* 9.8* 10.1*  HCT 32.0* 30.2* 29.3*  MCV 91.7 92.6 88.3  PLT 346 310 [id number]    Basic Metabolic Panel: Recent Labs  Lab 01/31/22 1459 02/01/22 0344 02/02/22 0358  NA 143 141 141  K 4.0 3.9 4.5  CL 106 106 106  CO2 26 25 31   GLUCOSE 101* 113* 143*  BUN 18 22* 26*  CREATININE 1.08 1.10 1.07  CALCIUM 9.3 9.2 9.5  MG  --  1.5* 1.9    GFR: Estimated Creatinine Clearance: 69.8 mL/min (by C-G formula based on SCr of 1.07  mg/dL).  Scheduled Meds:  aspirin  325 mg Oral Daily   atorvastatin  80 mg Oral QHS   carvedilol  6.25 mg Oral BID WC   dutasteride  0.5 mg Oral Daily   enoxaparin (LOVENOX) injection  40 mg Subcutaneous Q24H   insulin aspart  0-15 Units Subcutaneous TID WC   insulin aspart  0-5 Units Subcutaneous QHS   sodium chloride flush  3 mL Intravenous Q12H   tamsulosin  0.8 mg Oral QPC supper   Continuous Infusions:  sodium chloride       LOS: 4 days   Author: Debbe Odea  02/04/2022 4:22 PM

## 2022-02-05 DIAGNOSIS — I96 Gangrene, not elsewhere classified: Secondary | ICD-10-CM | POA: Diagnosis not present

## 2022-02-05 LAB — CBC
HCT: 27.8 % — ABNORMAL LOW (ref 39.0–52.0)
Hemoglobin: 9.2 g/dL — ABNORMAL LOW (ref 13.0–17.0)
MCH: 29.9 pg (ref 26.0–34.0)
MCHC: 33.1 g/dL (ref 30.0–36.0)
MCV: 90.3 fL (ref 80.0–100.0)
Platelets: 319 10*3/uL (ref 150–400)
RBC: 3.08 MIL/uL — ABNORMAL LOW (ref 4.22–5.81)
RDW: 13.5 % (ref 11.5–15.5)
WBC: 7.1 10*3/uL (ref 4.0–10.5)
nRBC: 0 % (ref 0.0–0.2)

## 2022-02-05 LAB — GLUCOSE, CAPILLARY
Glucose-Capillary: 108 mg/dL — ABNORMAL HIGH (ref 70–99)
Glucose-Capillary: 161 mg/dL — ABNORMAL HIGH (ref 70–99)
Glucose-Capillary: 202 mg/dL — ABNORMAL HIGH (ref 70–99)
Glucose-Capillary: 151 mg/dL — ABNORMAL HIGH (ref 70–99)

## 2022-02-05 NOTE — Plan of Care (Signed)
  Problem: Education: Goal: Ability to describe self-care measures that may prevent or decrease complications (Diabetes Survival Skills Education) will improve Outcome: Progressing Goal: Individualized Educational Video(s) Outcome: Progressing   Problem: Coping: Goal: Ability to adjust to condition or change in health will improve Outcome: Progressing   Problem: Fluid Volume: Goal: Ability to maintain a balanced intake and output will improve Outcome: Progressing   Problem: Health Behavior/Discharge Planning: Goal: Ability to identify and utilize available resources and services will improve Outcome: Progressing Goal: Ability to manage health-related needs will improve Outcome: Progressing   Problem: Metabolic: Goal: Ability to maintain appropriate glucose levels will improve Outcome: Progressing   Problem: Nutritional: Goal: Maintenance of adequate nutrition will improve Outcome: Progressing Goal: Progress toward achieving an optimal weight will improve Outcome: Progressing   Problem: Skin Integrity: Goal: Risk for impaired skin integrity will decrease Outcome: Progressing   Problem: Tissue Perfusion: Goal: Adequacy of tissue perfusion will improve Outcome: Progressing   Problem: Education: Goal: Knowledge of General Education information will improve Description: Including pain rating scale, medication(s)/side effects and non-pharmacologic comfort measures Outcome: Progressing   Problem: Health Behavior/Discharge Planning: Goal: Ability to manage health-related needs will improve Outcome: Progressing   Problem: Clinical Measurements: Goal: Ability to maintain clinical measurements within normal limits will improve Outcome: Progressing Goal: Will remain free from infection Outcome: Progressing Goal: Diagnostic test results will improve Outcome: Progressing Goal: Respiratory complications will improve Outcome: Progressing Goal: Cardiovascular complication will  be avoided Outcome: Progressing   Problem: Activity: Goal: Risk for activity intolerance will decrease Outcome: Progressing   Problem: Nutrition: Goal: Adequate nutrition will be maintained Outcome: Progressing   Problem: Coping: Goal: Level of anxiety will decrease Outcome: Progressing   Problem: Elimination: Goal: Will not experience complications related to bowel motility Outcome: Progressing Goal: Will not experience complications related to urinary retention Outcome: Progressing   Problem: Pain Managment: Goal: General experience of comfort will improve Outcome: Progressing   Problem: Safety: Goal: Ability to remain free from injury will improve Outcome: Progressing   Problem: Skin Integrity: Goal: Risk for impaired skin integrity will decrease Outcome: Progressing   Problem: Education: Goal: Understanding of CV disease, CV risk reduction, and recovery process will improve Outcome: Progressing Goal: Individualized Educational Video(s) Outcome: Progressing   Problem: Activity: Goal: Ability to return to baseline activity level will improve Outcome: Progressing   Problem: Health Behavior/Discharge Planning: Goal: Ability to safely manage health-related needs after discharge will improve Outcome: Progressing

## 2022-02-05 NOTE — Progress Notes (Signed)
[facility] Progress Note  Patient: Ryan Hughes     FVC:[id number]  DOA: 01/31/2022   PCP: [name], NP       Brief hospital course:   Ryan Hughes is a 56 y.o. 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  Subjective:  No new complaints.  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 - s/p angiogram 9/27- extensive vascular disease - needs amputation- patient aware and has declined - Family is asking about bypass surgery - vein mapping has been ordered- further discussions on Monday - Niece, Ryan Hughes is the POA - cont lipitor and ASA 325 mg - LDL 72, HDL 28  Active Problems:   HTN (hypertension) - Carvedilol 12.5 mg BID      Type 2 diabetes mellitus with vascular disease ([place]) - hold Glucotrol, Dulaglutide and Lantus - cont SSI- sugars are relatively controlled - A1c 13.9 in 12/08/21    Hypomagnesemia - replaced    Normocytic anemia - follow PRN  Ex-smoker - he states he quit smoking about 2 months ago  Disposition: Lives [facility] - plans are for him to go [place] after dc.     DVT prophylaxis:  enoxaparin (LOVENOX) injection 40 mg Start: 02/03/22 0800 SCDs Start: 01/31/22 1745   Code Status: Full Code  Consultants: vascular surgery Level of Care: Level of care: Med-Surg Objective:   Vitals:   02/05/22 0500 02/05/22 0516 02/05/22 0834 02/05/22 1112  BP:  (!) 143/71 131/76 104/62  Pulse:  74 73 72  Resp:  20 16 16   Temp:  97.7 F (36.5 C) 98.3 F  (36.8 C) 98.2 F (36.8 C)  TempSrc:  Oral Oral Axillary  SpO2:  98% 100% 97%  Weight: 78.4 kg     Height:       Filed Weights   02/03/22 0325 02/04/22 0618 02/05/22 0500  Weight: 79.2 kg 80.2 kg 78.4 kg   Exam: General exam: Appears comfortable  HEENT: oral mucosa moist Respiratory system: Clear to auscultation.  Cardiovascular system: S1 & S2 heard  Gastrointestinal system: Abdomen soft, non-tender, nondistended. Normal bowel sounds   Extremities: No cyanosis, clubbing or edema- gangrene in 1st and 2nd toes Psychiatry:  Mood & affect appropriate.       Imaging and lab data was personally reviewed    CBC: Recent Labs  Lab 01/31/22 1459 02/01/22 0344 02/02/22 0358 02/05/22 0141  WBC 8.7 8.0 8.6 7.1  NEUTROABS 4.7  --   --   --   HGB 10.6* 9.8* 10.1* 9.2*  HCT 32.0* 30.2* 29.3* 27.8*  MCV 91.7 92.6 88.3 90.3  PLT 346 310 314 638    Basic Metabolic Panel: Recent Labs  Lab 01/31/22 1459 02/01/22 0344 02/02/22 0358 02/04/22 1811  NA 143 141 141 141  K 4.0 3.9 4.5 4.2  CL 106 106 106 106  CO2 26 25 31 28   GLUCOSE 101* 113* 143* 154*  BUN 18 22* 26* 22*  CREATININE 1.08 1.10 1.07 1.00  CALCIUM 9.3 9.2 9.5 9.0  MG  --  1.5* 1.9  --  GFR: Estimated Creatinine Clearance: 73.9 mL/min (by C-G formula based on SCr of 1 mg/dL).  Scheduled Meds:  aspirin  325 mg Oral Daily   atorvastatin  80 mg Oral QHS   carvedilol  12.5 mg Oral BID WC   dutasteride  0.5 mg Oral Daily   enoxaparin (LOVENOX) injection  40 mg Subcutaneous Q24H   insulin aspart  0-15 Units Subcutaneous TID WC   insulin aspart  0-5 Units Subcutaneous QHS   sodium chloride flush  3 mL Intravenous Q12H   tamsulosin  0.8 mg Oral QPC supper   Continuous Infusions:  sodium chloride       LOS: 5 days   Author: Debbe Odea  02/05/2022 4:00 PM

## 2022-02-06 DIAGNOSIS — I96 Gangrene, not elsewhere classified: Secondary | ICD-10-CM | POA: Diagnosis not present

## 2022-02-06 LAB — GLUCOSE, CAPILLARY
Glucose-Capillary: 120 mg/dL — ABNORMAL HIGH (ref 70–99)
Glucose-Capillary: 157 mg/dL — ABNORMAL HIGH (ref 70–99)
Glucose-Capillary: 189 mg/dL — ABNORMAL HIGH (ref 70–99)
Glucose-Capillary: 102 mg/dL — ABNORMAL HIGH (ref 70–99)

## 2022-02-06 NOTE — Progress Notes (Signed)
[facility] Progress Note  Patient: Ryan Hughes     QJJ:[id number]  DOA: 01/31/2022   PCP: [name], NP       Brief hospital course:   Ryan Hughes is a 56 y.o. 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  Subjective:  No complaints.   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 - s/p angiogram 9/27- extensive vascular disease - needs amputation- patient aware and has declined - Family is asking about bypass surgery - vein mapping has been ordered- further discussions on Monday - Niece, Ryan Hughes is the POA - cont lipitor and ASA 325 mg - LDL 72, HDL 28  Active Problems:   HTN (hypertension) - Carvedilol 12.5 mg BID      Type 2 diabetes mellitus with vascular disease ([place]) - hold Glucotrol, Dulaglutide and Lantus - cont SSI- sugars are relatively controlled - A1c 13.9 in 12/08/21    Hypomagnesemia - replaced    Normocytic anemia - follow PRN  Ex-smoker - he states he quit smoking about 2 months ago  Disposition: Lives at [facility] - plans are for him to go [place] after dc.     DVT prophylaxis:  enoxaparin (LOVENOX) injection 40 mg Start: 02/03/22 0800 SCDs Start: 01/31/22 1745   Code Status: Full Code  Consultants: vascular surgery Level of Care: Level of care: Med-Surg Objective:   Vitals:   02/05/22 2027 02/06/22 0622 02/06/22 0743 02/06/22 1200  BP: 137/86  (!) 156/79 (!) 150/84  Pulse:   80 84  Resp: (!) 21  16 15   Temp: 98.9 F (37.2 C)  98.9 F  (37.2 C) 99.4 F (37.4 C)  TempSrc: Oral  Oral Oral  SpO2: 98%  100%   Weight:  79.8 kg    Height:       Filed Weights   02/04/22 0618 02/05/22 0500 02/06/22 0622  Weight: 80.2 kg 78.4 kg 79.8 kg   Exam: General exam: Appears comfortable  HEENT: oral mucosa moist Respiratory system: Clear to auscultation.  Cardiovascular system: S1 & S2 heard  Gastrointestinal system: Abdomen soft, non-tender, nondistended. Normal bowel sounds   Extremities: No cyanosis, clubbing or edema- gangrene 1st and 2nd toes Psychiatry:  Mood & affect appropriate.     Imaging and lab data was personally reviewed    CBC: Recent Labs  Lab 01/31/22 1459 02/01/22 0344 02/02/22 0358 02/05/22 0141  WBC 8.7 8.0 8.6 7.1  NEUTROABS 4.7  --   --   --   HGB 10.6* 9.8* 10.1* 9.2*  HCT 32.0* 30.2* 29.3* 27.8*  MCV 91.7 92.6 88.3 90.3  PLT 346 310 314 481    Basic Metabolic Panel: Recent Labs  Lab 01/31/22 1459 02/01/22 0344 02/02/22 0358 02/04/22 1811  NA 143 141 141 141  K 4.0 3.9 4.5 4.2  CL 106 106 106 106  CO2 26 25 31 28   GLUCOSE 101* 113* 143* 154*  BUN 18 22* 26* 22*  CREATININE 1.08 1.10 1.07 1.00  CALCIUM 9.3 9.2 9.5 9.0  MG  --  1.5* 1.9  --  GFR: Estimated Creatinine Clearance: 74.6 mL/min (by C-G formula based on SCr of 1 mg/dL).  Scheduled Meds:  aspirin  325 mg Oral Daily   atorvastatin  80 mg Oral QHS   carvedilol  12.5 mg Oral BID WC   dutasteride  0.5 mg Oral Daily   enoxaparin (LOVENOX) injection  40 mg Subcutaneous Q24H   insulin aspart  0-15 Units Subcutaneous TID WC   insulin aspart  0-5 Units Subcutaneous QHS   sodium chloride flush  3 mL Intravenous Q12H   tamsulosin  0.8 mg Oral QPC supper   Continuous Infusions:  sodium chloride       LOS: 6 days   Author: Calvert Cantor  02/06/2022 2:35 PM

## 2022-02-07 DIAGNOSIS — I96 Gangrene, not elsewhere classified: Secondary | ICD-10-CM | POA: Diagnosis not present

## 2022-02-07 DIAGNOSIS — I70235 Atherosclerosis of native arteries of right leg with ulceration of other part of foot: Secondary | ICD-10-CM | POA: Diagnosis not present

## 2022-02-07 LAB — CBC
HCT: 26.5 % — ABNORMAL LOW (ref 39.0–52.0)
Hemoglobin: 9 g/dL — ABNORMAL LOW (ref 13.0–17.0)
MCH: 30.5 pg (ref 26.0–34.0)
MCHC: 34 g/dL (ref 30.0–36.0)
MCV: 89.8 fL (ref 80.0–100.0)
Platelets: 335 10*3/uL (ref 150–400)
RBC: 2.95 MIL/uL — ABNORMAL LOW (ref 4.22–5.81)
RDW: 13.6 % (ref 11.5–15.5)
WBC: 7.4 10*3/uL (ref 4.0–10.5)
nRBC: 0 % (ref 0.0–0.2)

## 2022-02-07 LAB — BASIC METABOLIC PANEL
Anion gap: 8 (ref 5–15)
BUN: 19 mg/dL (ref 6–20)
CO2: 26 mmol/L (ref 22–32)
Calcium: 9 mg/dL (ref 8.9–10.3)
Chloride: 106 mmol/L (ref 98–111)
Creatinine, Ser: 1.22 mg/dL (ref 0.61–1.24)
GFR, Estimated: >60 mL/min (ref >60)
Glucose, Bld: 120 mg/dL — ABNORMAL HIGH (ref 70–99)
Potassium: 4.3 mmol/L (ref 3.5–5.1)
Sodium: 140 mmol/L (ref 135–145)

## 2022-02-07 LAB — GLUCOSE, CAPILLARY
Glucose-Capillary: 112 mg/dL — ABNORMAL HIGH (ref 70–99)
Glucose-Capillary: 198 mg/dL — ABNORMAL HIGH (ref 70–99)
Glucose-Capillary: 139 mg/dL — ABNORMAL HIGH (ref 70–99)

## 2022-02-07 NOTE — Progress Notes (Addendum)
  Progress Note    02/07/2022 7:43 AM 5 Days Post-Op  Subjective: does not want amputation.  Wants to think more about bypass.   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 Neurologic: A&O  CBC    Component Value Date/Time   WBC 7.4 02/07/2022 0121   RBC 2.95 (L) 02/07/2022 0121   HGB 9.0 (L) 02/07/2022 0121   HGB 15.3 12/08/2021 1102   HCT 26.5 (L) 02/07/2022 0121   HCT 46.8 12/08/2021 1102   PLT 335 02/07/2022 0121   PLT 307 12/08/2021 1102   MCV 89.8 02/07/2022 0121   MCV 91 12/08/2021 1102   MCH 30.5 02/07/2022 0121   MCHC 34.0 02/07/2022 0121   RDW 13.6 02/07/2022 0121   RDW 13.4 12/08/2021 1102   LYMPHSABS 3.3 01/31/2022 1459   LYMPHSABS 1.9 12/08/2021 1102   MONOABS 0.5 01/31/2022 1459   EOSABS 0.1 01/31/2022 1459   EOSABS 0.0 12/08/2021 1102   BASOSABS 0.0 01/31/2022 1459   BASOSABS 0.0 12/08/2021 1102    BMET    Component Value Date/Time   NA 140 02/07/2022 0121   NA 138 12/08/2021 1102   K 4.3 02/07/2022 0121   CL 106 02/07/2022 0121   CO2 26 02/07/2022 0121   GLUCOSE 120 (H) 02/07/2022 0121   BUN 19 02/07/2022 0121   BUN 14 12/08/2021 1102   CREATININE 1.22 02/07/2022 0121   CALCIUM 9.0 02/07/2022 0121   GFRNONAA >60 02/07/2022 0121   GFRAA >60 03/17/2019 0839    INR    Component Value Date/Time   INR 1.0 02/01/2022 0344     Intake/Output Summary (Last 24 hours) at 02/07/2022 0743 Last data filed at 02/06/2022 1840 Gross per 24 hour  Intake 150 ml  Output 500 ml  Net -350 ml     Assessment/Plan:  56 y.o. male with R 1st and 2nd toe wounds 5 Days Post-Op   Adamantly against RLE amputation Would like to discuss bypass surgery further Vein mapping shows adequate R GSV Dr. Donzetta Matters will evaluate the patient later today     Dagoberto Ligas, PA-C [facility] [phone] 02/07/2022 7:43 AM  I have independently interviewed and examined patient and agree with PA assessment and plan above.  I discussed with the patient and his family currently planning for right lower extremity bypass tomorrow with amputation of first 2 toes.  Patient may elect for right above-knee amputation we will discuss again tomorrow prior to surgical intervention.  He will be n.p.o. past midnight.  Zamira Hickam C. Donzetta Matters, MD [facility] [place] Office: [phone] Pager: [phone]

## 2022-02-07 NOTE — Progress Notes (Signed)
[facility] Progress Note  Patient: Ryan Hughes     UVO:[id number]  DOA: 01/31/2022   PCP: [name], NP       Brief hospital course:   Ryan Hughes is a 56 y.o. 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  Subjective:  No new complaints. Pain in right foot persists.   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 - s/p angiogram 9/27- extensive vascular disease - needs amputation- patient aware and has declined - he is asking about bypass surgery - vein mapping completed - Niece, Len Blalock is the POA - cont lipitor and ASA 325 mg - LDL 72, HDL 28  Active Problems:   HTN (hypertension) - Carvedilol 12.5 mg BID      Type 2 diabetes mellitus with vascular disease ([place]) - hold Glucotrol, Dulaglutide and Lantus - cont SSI- sugars are relatively controlled - A1c 13.9 in 12/08/21    Hypomagnesemia - replaced    Normocytic anemia - follow PRN  Ex-smoker - he states he quit smoking about 2 months ago  Disposition: Lives at [facility] - plans are for him to go [place] after dc.     DVT prophylaxis:  enoxaparin (LOVENOX) injection 40 mg Start: 02/03/22 0800 SCDs Start: 01/31/22 1745   Code Status: Full Code  Consultants: vascular surgery Level of Care: Level of care: Med-Surg Objective:   Vitals:   02/07/22 0017 02/07/22 0500 02/07/22 0850 02/07/22 1211  BP:   (!) 140/66 137/77  Pulse:   81 75  Resp: 14  19 20   Temp:   99 F (37.2 C) 98.4 F (36.9 C)  TempSrc:    Oral Oral  SpO2:   100% 98%  Weight: 77.2 kg 77.3 kg    Height:       Filed Weights   02/06/22 0622 02/07/22 0017 02/07/22 0500  Weight: 79.8 kg 77.2 kg 77.3 kg   Exam: General exam: Appears comfortable  HEENT: oral mucosa moist Respiratory system: Clear to auscultation.  Cardiovascular system: S1 & S2 heard  Gastrointestinal system: Abdomen soft, non-tender, nondistended. Normal bowel sounds   Extremities: No cyanosis, clubbing or edema- gangrene of left 1st and 2nd toes Psychiatry:  Mood & affect appropriate.     Imaging and lab data was personally reviewed    CBC: Recent Labs  Lab 02/01/22 0344 02/02/22 0358 02/05/22 0141 02/07/22 0121  WBC 8.0 8.6 7.1 7.4  HGB 9.8* 10.1* 9.2* 9.0*  HCT 30.2* 29.3* 27.8* 26.5*  MCV 92.6 88.3 90.3 89.8  PLT 310 314 319 742    Basic Metabolic Panel: Recent Labs  Lab 02/01/22 0344 02/02/22 0358 02/04/22 1811 02/07/22 0121  NA 141 141 141 140  K 3.9 4.5 4.2 4.3  CL 106 106 106 106  CO2 25 31 28 26   GLUCOSE 113* 143* 154* 120*  BUN 22* 26* 22* 19  CREATININE 1.10 1.07 1.00 1.22  CALCIUM 9.2 9.5 9.0 9.0  MG 1.5* 1.9  --   --     GFR: Estimated Creatinine Clearance: 60.2 mL/min (by C-G formula based on SCr  of 1.22 mg/dL).  Scheduled Meds:  aspirin  325 mg Oral Daily   atorvastatin  80 mg Oral QHS   carvedilol  12.5 mg Oral BID WC   dutasteride  0.5 mg Oral Daily   enoxaparin (LOVENOX) injection  40 mg Subcutaneous Q24H   insulin aspart  0-15 Units Subcutaneous TID WC   insulin aspart  0-5 Units Subcutaneous QHS   sodium chloride flush  3 mL Intravenous Q12H   tamsulosin  0.8 mg Oral QPC supper   Continuous Infusions:  sodium chloride       LOS: 7 days   Author: Debbe Odea  02/07/2022 3:25 PM

## 2022-02-08 DIAGNOSIS — D638 Anemia in other chronic diseases classified elsewhere: Secondary | ICD-10-CM

## 2022-02-08 DIAGNOSIS — I70235 Atherosclerosis of native arteries of right leg with ulceration of other part of foot: Secondary | ICD-10-CM | POA: Diagnosis not present

## 2022-02-08 LAB — TYPE AND SCREEN
ABO/RH(D): AB POS
Antibody Screen: NEGATIVE

## 2022-02-08 LAB — ABO/RH: ABO/RH(D): AB POS

## 2022-02-08 LAB — GLUCOSE, CAPILLARY
Glucose-Capillary: 240 mg/dL — ABNORMAL HIGH (ref 70–99)
Glucose-Capillary: 182 mg/dL — ABNORMAL HIGH (ref 70–99)

## 2022-02-08 SURGERY — CREATION, BYPASS, ARTERIAL, FEMORAL TO PERONEAL, USING GRAFT
Anesthesia: General | Laterality: Right

## 2022-02-08 MED ORDER — SENNOSIDES-DOCUSATE SODIUM 8.6-50 MG PO TABS: 1.0000 | ORAL_TABLET | Freq: Every evening | ORAL | Status: DC | PRN

## 2022-02-08 MED ORDER — INSULIN ASPART 100 UNIT/ML IJ SOLN: 0.0000 [IU] | Freq: Three times a day (TID) | INTRAMUSCULAR | 11 refills | Status: DC

## 2022-02-08 MED ORDER — THIAMINE HCL 100 MG PO TABS: 100.0000 mg | ORAL_TABLET | Freq: Every day | ORAL | 0 refills | Status: DC

## 2022-02-08 MED ORDER — CARVEDILOL 12.5 MG PO TABS: 12.5000 mg | ORAL_TABLET | Freq: Two times a day (BID) | ORAL | Status: AC

## 2022-02-08 MED ORDER — ASPIRIN 325 MG PO TABS: 325.0000 mg | ORAL_TABLET | Freq: Every day | ORAL | Status: DC

## 2022-02-08 NOTE — Progress Notes (Signed)
Patient discharging to [facility], Report called to nurse Rodrigo Ran. [facility] notified to transport patient. Patient and family aware of d/c plan.  Joda Braatz, Tivis Ringer, RN

## 2022-02-08 NOTE — TOC Transition Note (Signed)
Transition of Care [facility]) - CM/SW Discharge Note   Patient Details  Name: Ryan Hughes MRN: [id number] Date of Birth: [birth year]  [facility] CM/SW Contact:  Vinie Sill, LCSW Phone Number: 02/08/2022, 3:35 PM   Clinical Narrative:     Patient will Discharge to: [facility] Discharge Date: 02/08/2022 Family Notified: Niece,[name] Transport [id number]  Per MD patient is ready for discharge. RN, patient, and facility notified of discharge. Discharge Summary sent to facility. RN given number for [phone], Room 414. Ambulance transport requested for patient.   Clinical Social Worker signing off.  Thurmond Butts, MSW, LCSW Clinical Social Worker    Final next level of care: Skilled Nursing Facility Barriers to Discharge: Barriers Resolved   Patient Goals and CMS Choice        Discharge Placement              Patient chooses bed at:  [facility]) Patient to be transferred to facility by: [facility] Name of family member notified: niece Patient and family notified of of transfer: 02/08/22  Discharge Plan and Services In-house Referral: Clinical Social Work                                   Social Determinants of Health (SDOH) Interventions     Readmission Risk Interventions     No data to display

## 2022-02-08 NOTE — Progress Notes (Signed)
[facility] here to transport patient, left via stretcher no c/o pain or shortness of breath at d/c. Family aware of d/c plan. Discharge paperwork sent VIA [facility] services.  Karmen Altamirano, Tivis Ringer, RN

## 2022-02-08 NOTE — Plan of Care (Signed)
  Problem: Education: Goal: Ability to describe self-care measures that may prevent or decrease complications (Diabetes Survival Skills Education) will improve Outcome: Progressing Goal: Individualized Educational Video(s) Outcome: Progressing   Problem: Education: Goal: Individualized Educational Video(s) Outcome: Progressing   

## 2022-02-08 NOTE — Discharge Summary (Signed)
Physician Discharge Summary  Ryan Hughes VFI:[id number] DOB: 01-30-1966 DOA: 01/31/2022  PCP: Grayce Sessions, NP  Admit date: 01/31/2022 Discharge date: 02/08/2022 Discharging to: SNF Recommendations for Outpatient Follow-up:  F/u with Dr Randie Heinz, vascular surgery  Consults:  Vascular surgery Procedures:  Vein mapping   Discharge Diagnoses:   Principal Problem:   Gangrene (HCC) Active Problems:   HTN (hypertension)   Type 2 diabetes mellitus with vascular disease (HCC)   Hypomagnesemia   Anemia of chronic disease     Hospital Course:  Ryan Hughes is a 56 y.o. male with medical history significant of uncontrolled diabetes mellitus type 2, hypertension, prior unspecified stroke with h/o left-sided hemiplegia and aphasia,who is wheelchair bound and had a recent admission from 12/14/2021-12/20/2021 for hyperglycemic hyperosmolar state with intractable nausea and vomiting and acute on chronic urinary retention requiring Foley catheter placement presented from [facility] with complaints of increasing right foot pain.   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 - s/p angiogram 9/27- extensive vascular disease - amputation recommended but patient has declined  - he (and his family) has asked about bypass surgery with amputation of R 1st two toes and was scheduled for this today but declined this as well - Niece, Olen Pel is the POA - cont lipitor and ASA 325 mg - LDL 72, HDL 28   Active Problems:   HTN (hypertension) - Carvedilol 12.5 mg BID + Losartan     Type 2 diabetes mellitus, uncontrolled with hyperglycemia & vascular disease (HCC) - home meds> Glucotrol, Dulaglutide and Lantus - has been receiving SSI- sugars have been relatively controlled - A1c 13.9 in 12/08/21     Hypomagnesemia - replaced     Normocytic anemia - follow PRN   Ex-smoker - he states he quit smoking about 2 months ago    Disposition: He was living at [facility] - plans are for him to go [place] after dc.    Obesity  Body mass index is 31.17 kg/m.           Discharge Instructions  Discharge Instructions     Diet - low sodium heart healthy   Complete by: As directed    Diet Carb Modified   Complete by: As directed    Increase activity slowly   Complete by: As directed       Allergies as of 02/08/2022       Reactions   Pork-derived Products Other (See Comments)   Causes gout flares- "allergic," per Order Summary    Shellfish-derived Products Other (See Comments)   Causes gout flares- "allergic," per Order Summary         Medication List     STOP taking these medications    glipiZIDE 5 MG tablet Commonly known as: GLUCOTROL   hydrochlorothiazide 12.5 MG tablet Commonly known as: HYDRODIURIL   insulin glargine 100 UNIT/ML Solostar Pen Commonly known as: LANTUS   potassium chloride 10 MEQ tablet Commonly known as: KLOR-CON   Semglee (yfgn) 100 UNIT/ML Pen Generic drug: insulin glargine-yfgn   traMADol 50 MG tablet Commonly known as: ULTRAM       TAKE these medications    acetaminophen 325 MG tablet Commonly known as: TYLENOL Take 650 mg by mouth every 4 (four) hours as needed (for pain). What changed: Another medication with the same name was removed. Continue taking this medication, and follow the directions you see here.  aspirin 325 MG tablet Take 1 tablet (325 mg total) by mouth daily. Start taking on: February 09, 2022   atorvastatin 80 MG tablet Commonly known as: LIPITOR Take 1 tablet (80 mg total) by mouth at bedtime. What changed: when to take this   carvedilol 12.5 MG tablet Commonly known as: COREG Take 1 tablet (12.5 mg total) by mouth 2 (two) times daily with a meal. What changed:  medication strength how much to take   dutasteride 0.5 MG capsule Commonly known as: AVODART Take 1 capsule (0.5 mg total) by mouth daily.   insulin aspart  100 UNIT/ML injection Commonly known as: novoLOG Inject 0-15 Units into the skin 3 (three) times daily with meals. CBG 70 - 120: 0 units  CBG 121 - 150: 2 units  CBG 151 - 200: 3 units  CBG 201 - 250: 5 units  CBG 251 - 300: 8 units  CBG 301 - 350: 11 units  CBG 351 - 400: 15 units   losartan 50 MG tablet Commonly known as: COZAAR Take 1 tablet (50 mg total) by mouth daily.   metFORMIN 1000 MG tablet Commonly known as: GLUCOPHAGE Take 1 tablet (1,000 mg total) by mouth 2 (two) times daily with a meal.   multivitamin with minerals Tabs tablet Take 1 tablet by mouth daily.   pantoprazole 40 MG tablet Commonly known as: PROTONIX Take 1 tablet (40 mg total) by mouth 2 (two) times daily.   senna-docusate 8.6-50 MG tablet Commonly known as: Senokot-S Take 1 tablet by mouth at bedtime as needed for mild constipation.   tamsulosin 0.4 MG Caps capsule Commonly known as: FLOMAX Take 2 capsules (0.8 mg total) by mouth daily.   thiamine 100 MG tablet Commonly known as: VITAMIN B1 Take 1 tablet (100 mg total) by mouth daily.   Trulicity 0.75 MG/0.5ML Sopn Generic drug: Dulaglutide Inject 0.75 mg into the skin once a week. What changed: when to take this        Contact information for after-discharge care     Destination     [facility] .   Service: Skilled Nursing Contact information: [address] [place] [address] [phone]                        The results of significant diagnostics from this hospitalization (including imaging, microbiology, ancillary and laboratory) are listed below for reference.    VAS [place] LOWER EXTREMITY SAPHENOUS VEIN MAPPING  Result Date: 02/04/2022 LOWER EXTREMITY VEIN MAPPING Patient Name:  Ryan Hughes  Date of Exam:   02/04/2022 Medical Rec #: [id number]      Accession #:    [id number] Date of Birth: Feb 23, 1966      Patient Gender: M Patient Age:   5 years Exam Location:  [facility]  Procedure:      VAS [place] LOWER EXTREMITY SAPHENOUS VEIN MAPPING Referring Phys: Lemar Livings --------------------------------------------------------------------------------  Indications:  Pre-op Risk Factors: Hypertension, Diabetes, current smoker, PAD.  Performing Technologist: Marilynne Halsted RDMS, RVT  Examination Guidelines: A complete evaluation includes B-mode imaging, spectral Doppler, color Doppler, and power Doppler as needed of all accessible portions of each vessel. Bilateral testing is considered an integral part of a complete examination. Limited examinations for reoccurring indications may be performed as noted. +---------------+-----------+----------------------+---------------+-----------+   RT Diameter  RT Findings         GSV            LT Diameter  LT  Findings      (cm)                                            (cm)                  +---------------+-----------+----------------------+---------------+-----------+      0.67                     Saphenofemoral                                                                Junction                                  +---------------+-----------+----------------------+---------------+-----------+      0.52                     Proximal thigh                               +---------------+-----------+----------------------+---------------+-----------+      0.43                       Mid thigh                                  +---------------+-----------+----------------------+---------------+-----------+      0.49                      Distal thigh                                +---------------+-----------+----------------------+---------------+-----------+      0.48                          Knee                                    +---------------+-----------+----------------------+---------------+-----------+      0.41                       Prox calf                                   +---------------+-----------+----------------------+---------------+-----------+      0.33       branching        Mid calf                                  +---------------+-----------+----------------------+---------------+-----------+      0.27       branching      Distal calf                                 +---------------+-----------+----------------------+---------------+-----------+  0.25                         Ankle                                    +---------------+-----------+----------------------+---------------+-----------+ Diagnosing physician: Waverly Ferrarihristopher Dickson MD Electronically signed by Waverly Ferrarihristopher Dickson MD on 02/04/2022 at 1:32:59 PM.    Final    PERIPHERAL VASCULAR CATHETERIZATION  Result Date: 02/02/2022 Images from the original result were not included. Patient name: Ryan Hughes MRN: [id number] DOB: Sep 18, 1965 Sex: male 02/02/2022 Pre-operative Diagnosis: Right lower extremity critical limb ischemia with tissue loss Post-operative diagnosis:  Same Surgeon:  Victorino SparrowJoshua E Robins, MD Procedure Performed: 1.  Ultrasound-guided micropuncture access of the left common femoral artery 2.  Aortogram 3.  Second order cannulation, right lower extremity angiogram 4.  Device assisted closure-Mynx 5.  Moderate sedation time 20 minutes 6.  Contrast volume-100 ml 7.  8.  Indications: Patient is a 56 year old male with peripheral arterial disease who presented with wounds on his right foot and a nonpalpable pulse.  ABIs demonstrated monophasic waveforms at the level of the ankle with no toe pressure appreciated.  After discussing the risk and benefits of right lower extremity angiogram in an effort to define and possibly improve distal perfusion for wound healing, Ryan CitizenStevie and his niece elected to proceed Findings: Aortogram: Bilateral renal arteries patent, no flow-limiting stenosis appreciated in the aortoiliac system Right lower extremity angiogram: No flow-limiting stenosis  appreciated in the common femoral artery.  Profunda widely patent with calcific disease appreciated within its distal branches.  Superficial femoral artery with multiple, tandem, flow-limiting stenoses appreciated throughout its length.  Several areas of near-occlusion.  It appears to reconstitute via diffuse collaterals in islands.  The popliteal artery is initially patent, terminating abruptly in the P2 segment into diffuse collaterals.  No named vessels in the calf.  There appears to be a small, atraumatic peroneal artery that fills at the distal tibia for a short segment.  Runoff into the foot all collaterals, no named vessels.  Procedure:  The patient was identified in the holding area and taken to room 8.  The patient was then placed supine on the table and prepped and draped in the usual sterile fashion.  A time out was called.  Ultrasound was used to evaluate the left common femoral artery.  It was patent .  A digital ultrasound image was acquired.  A micropuncture needle was used to access the left common femoral artery under ultrasound guidance.  An 018 wire was advanced without resistance and a micropuncture sheath was placed.  The 018 wire was removed and a benson wire was placed.  The micropuncture sheath was exchanged for a 5 french sheath.  An omniflush catheter was advanced over the wire to the level of L-1.  An abdominal angiogram was obtained.  Next, using the omniflush catheter and a benson wire, the aortic bifurcation was crossed and the catheter was placed into theright external iliac artery and right runoff was obtained. Impression: Unreconstructable vascular disease.  Patient will require amputation.  Will discuss options with family Fara OldenJ. Eli Robins, MD [facility] [place] Office: [phone]   VAS US ABI WITH/WO TBI  Result Date: 01/31/2022  LOWER EXTREMITY DOPPLER STUDY Patient Name:  Ryan Hughes  Date of Exam:   01/31/2022 Medical Rec #: [id number]  Accession  #:    [id number] Date of Birth: 12/11/1965      Patient Gender: M Patient Age:   86 years Exam Location:  [facility] Procedure:      VAS [place] ABI WITH/WO TBI Referring Phys: BRITNI HENDERLY --------------------------------------------------------------------------------  Indications: Gangrene. High Risk Factors: Hypertension, hyperlipidemia, Diabetes, current smoker, prior                    CVA. Other Factors: AICD, pacemaker.  Comparison Study: No prior study Performing Technologist: Sherren Kerns RVS  Examination Guidelines: A complete evaluation includes at minimum, Doppler waveform signals and systolic blood pressure reading at the level of bilateral brachial, anterior tibial, and posterior tibial arteries, when vessel segments are accessible. Bilateral testing is considered an integral part of a complete examination. Photoelectric Plethysmograph (PPG) waveforms and toe systolic pressure readings are included as required and additional duplex testing as needed. Limited examinations for reoccurring indications may be performed as noted.  ABI Findings: +---------+------------------+-----+-------------------+--------+ Right    Rt Pressure (mmHg)IndexWaveform           Comment  +---------+------------------+-----+-------------------+--------+ Brachial 147                    triphasic                   +---------+------------------+-----+-------------------+--------+ PTA      69                0.47 dampened monophasic         +---------+------------------+-----+-------------------+--------+ DP       81                0.55 dampened monophasic         +---------+------------------+-----+-------------------+--------+ Great Toe                       Absent                      +---------+------------------+-----+-------------------+--------+ +---------+------------------+-----+----------+-------+ Left     Lt Pressure (mmHg)IndexWaveform  Comment  +---------+------------------+-----+----------+-------+ Brachial 142                    triphasic         +---------+------------------+-----+----------+-------+ PTA      254               1.73 monophasic        +---------+------------------+-----+----------+-------+ DP       254               1.73 monophasic        +---------+------------------+-----+----------+-------+ Great Toe69                0.47                   +---------+------------------+-----+----------+-------+ +-------+---------------------+-----------+------------+------------+ ABI/TBIToday's ABI          Today's TBIPrevious ABIPrevious TBI +-------+---------------------+-----------+------------+------------+ Right  0.55                 absent                              +-------+---------------------+-----------+------------+------------+ Left   1.73 non compressible0.47                                +-------+---------------------+-----------+------------+------------+  Summary:  Right: Resting right ankle-brachial index indicates moderate right lower extremity arterial disease. The right toe-brachial index is abnormal. Left: Resting left ankle-brachial index indicates noncompressible left lower extremity arteries. The left toe-brachial index is abnormal. *See table(s) above for measurements and observations.  Electronically signed by Lemar Livings MD on 01/31/2022 at 6:06:54 PM.    Final    DG Foot Complete Right  Result Date: 01/31/2022 CLINICAL DATA:  Stubbed toes, pain EXAM: RIGHT FOOT COMPLETE - 3+ VIEW COMPARISON:  None Available. FINDINGS: No displaced fracture or dislocation is seen. Small bony spurs seen in the interphalangeal joint of big toe. Minimal bony spurs seen in first metatarsophalangeal joint. Bony spurs are noted in the dorsal aspect of intertarsal and tarsometatarsal joints. Arterial calcifications are seen in soft tissues. IMPRESSION: No recent fracture or dislocation is seen.  Degenerative changes are noted in multiple joints as described in the body of the report. Electronically Signed   By: Ernie Avena M.D.   On: 01/31/2022 15:16   Labs:   Basic Metabolic Panel: Recent Labs  Lab 02/02/22 0358 02/04/22 1811 02/07/22 0121  NA 141 141 140  K 4.5 4.2 4.3  CL 106 106 106  CO2 31 28 26   GLUCOSE 143* 154* 120*  BUN 26* 22* 19  CREATININE 1.07 1.00 1.22  CALCIUM 9.5 9.0 9.0  MG 1.9  --   --      CBC: Recent Labs  Lab 02/02/22 0358 02/05/22 0141 02/07/22 0121  WBC 8.6 7.1 7.4  HGB 10.1* 9.2* 9.0*  HCT 29.3* 27.8* 26.5*  MCV 88.3 90.3 89.8  PLT 314 319 335         SIGNED:   04/09/22, MD  [facility] 02/08/2022, 2:59 PM

## 2022-02-08 NOTE — Plan of Care (Signed)
Problem: Education: Goal: Ability to describe self-care measures that may prevent or decrease complications (Diabetes Survival Skills Education) will improve 02/08/2022 1732 by Emmaline Life, RN Outcome: Adequate for Discharge 02/08/2022 1204 by Emmaline Life, RN Outcome: Progressing Goal: Individualized Educational Video(s) 02/08/2022 1732 by Emmaline Life, RN Outcome: Adequate for Discharge 02/08/2022 1204 by Emmaline Life, RN Outcome: Progressing   Problem: Coping: Goal: Ability to adjust to condition or change in health will improve Outcome: Adequate for Discharge   Problem: Fluid Volume: Goal: Ability to maintain a balanced intake and output will improve Outcome: Adequate for Discharge   Problem: Health Behavior/Discharge Planning: Goal: Ability to identify and utilize available resources and services will improve Outcome: Adequate for Discharge Goal: Ability to manage health-related needs will improve Outcome: Adequate for Discharge   Problem: Metabolic: Goal: Ability to maintain appropriate glucose levels will improve Outcome: Adequate for Discharge   Problem: Nutritional: Goal: Maintenance of adequate nutrition will improve Outcome: Adequate for Discharge Goal: Progress toward achieving an optimal weight will improve Outcome: Adequate for Discharge   Problem: Skin Integrity: Goal: Risk for impaired skin integrity will decrease Outcome: Adequate for Discharge   Problem: Tissue Perfusion: Goal: Adequacy of tissue perfusion will improve Outcome: Adequate for Discharge   Problem: Education: Goal: Knowledge of General Education information will improve Description: Including pain rating scale, medication(s)/side effects and non-pharmacologic comfort measures Outcome: Adequate for Discharge   Problem: Health Behavior/Discharge Planning: Goal: Ability to manage health-related needs will improve Outcome: Adequate for Discharge   Problem: Clinical  Measurements: Goal: Ability to maintain clinical measurements within normal limits will improve Outcome: Adequate for Discharge Goal: Will remain free from infection Outcome: Adequate for Discharge Goal: Diagnostic test results will improve Outcome: Adequate for Discharge Goal: Respiratory complications will improve Outcome: Adequate for Discharge Goal: Cardiovascular complication will be avoided Outcome: Adequate for Discharge   Problem: Clinical Measurements: Goal: Ability to maintain clinical measurements within normal limits will improve Outcome: Adequate for Discharge Goal: Will remain free from infection Outcome: Adequate for Discharge Goal: Diagnostic test results will improve Outcome: Adequate for Discharge Goal: Respiratory complications will improve Outcome: Adequate for Discharge Goal: Cardiovascular complication will be avoided Outcome: Adequate for Discharge   Problem: Activity: Goal: Risk for activity intolerance will decrease Outcome: Adequate for Discharge   Problem: Nutrition: Goal: Adequate nutrition will be maintained Outcome: Adequate for Discharge   Problem: Coping: Goal: Level of anxiety will decrease Outcome: Adequate for Discharge   Problem: Elimination: Goal: Will not experience complications related to bowel motility Outcome: Adequate for Discharge Goal: Will not experience complications related to urinary retention Outcome: Adequate for Discharge   Problem: Pain Managment: Goal: General experience of comfort will improve Outcome: Adequate for Discharge   Problem: Safety: Goal: Ability to remain free from injury will improve Outcome: Adequate for Discharge   Problem: Skin Integrity: Goal: Risk for impaired skin integrity will decrease Outcome: Adequate for Discharge   Problem: Education: Goal: Understanding of CV disease, CV risk reduction, and recovery process will improve Outcome: Adequate for Discharge Goal: Individualized  Educational Video(s) Outcome: Adequate for Discharge   Problem: Activity: Goal: Ability to return to baseline activity level will improve Outcome: Adequate for Discharge   Problem: Cardiovascular: Goal: Ability to achieve and maintain adequate cardiovascular perfusion will improve Outcome: Adequate for Discharge Goal: Vascular access site(s) Level 0-1 will be maintained Outcome: Adequate for Discharge   Problem: Health Behavior/Discharge Planning: Goal: Ability to safely manage health-related needs after discharge will improve  Outcome: Adequate for Discharge   

## 2022-02-08 NOTE — Progress Notes (Signed)
  Progress Note    02/08/2022 8:06 AM 6 Days Post-Op  Subjective:  does not want surgery  Vitals:   02/08/22 0315 02/08/22 0500  BP:    Pulse:    Resp: 16 (!) 24  Temp:    SpO2:      Physical Exam: Awake and alert Stable gangrene right toes  CBC    Component Value Date/Time   WBC 7.4 02/07/2022 0121   RBC 2.95 (L) 02/07/2022 0121   HGB 9.0 (L) 02/07/2022 0121   HGB 15.3 12/08/2021 1102   HCT 26.5 (L) 02/07/2022 0121   HCT 46.8 12/08/2021 1102   PLT 335 02/07/2022 0121   PLT 307 12/08/2021 1102   MCV 89.8 02/07/2022 0121   MCV 91 12/08/2021 1102   MCH 30.5 02/07/2022 0121   MCHC 34.0 02/07/2022 0121   RDW 13.6 02/07/2022 0121   RDW 13.4 12/08/2021 1102   LYMPHSABS 3.3 01/31/2022 1459   LYMPHSABS 1.9 12/08/2021 1102   MONOABS 0.5 01/31/2022 1459   EOSABS 0.1 01/31/2022 1459   EOSABS 0.0 12/08/2021 1102   BASOSABS 0.0 01/31/2022 1459   BASOSABS 0.0 12/08/2021 1102    BMET    Component Value Date/Time   NA 140 02/07/2022 0121   NA 138 12/08/2021 1102   K 4.3 02/07/2022 0121   CL 106 02/07/2022 0121   CO2 26 02/07/2022 0121   GLUCOSE 120 (H) 02/07/2022 0121   BUN 19 02/07/2022 0121   BUN 14 12/08/2021 1102   CREATININE 1.22 02/07/2022 0121   CALCIUM 9.0 02/07/2022 0121   GFRNONAA >60 02/07/2022 0121   GFRAA >60 03/17/2019 0839    INR    Component Value Date/Time   INR 1.0 02/01/2022 0344     Intake/Output Summary (Last 24 hours) at 02/08/2022 0806 Last data filed at 02/07/2022 1957 Gross per 24 hour  Intake 240 ml  Output 1300 ml  Net -1060 ml     Assessment/plan:  56 y.o. male is here with right toe gangrene unsure of next steps. He was on schedule today for surgery but does not want to proceed. I have discussed with [name] his niece and she is in agreement to cancel. Likely looking at right aka later this week or as outpatient as currently not infected with only minimal pain.    Lucia Mccreadie C. Donzetta Matters, MD [facility]  [place] Office: [phone] Pager: [phone]  02/08/2022 8:06 AM

## 2022-02-10 ENCOUNTER — Other Ambulatory Visit: Payer: Self-pay

## 2022-02-10 DIAGNOSIS — I70221 Atherosclerosis of native arteries of extremities with rest pain, right leg: Secondary | ICD-10-CM

## 2022-02-15 NOTE — Pre-Procedure Instructions (Addendum)
    Ryan Hughes  02/15/2022             [name] needs to arrive at 5:30 AM at [facility]', the [facility].  Procedure is scheduled on Wednesday, October 12.    Call this number if you have problems the morning of surgery: [phone] the Pre- Surgery Desk  >>>>>Please send patient's Medication Record with medications administrated documentation. ( this information is required prior to OR. This includes medications that may have been on hold for surgery)<<<<<   Hold Vitamins  Remember:  Do not eat or drink after midnight On Wednesday, February 16, 2022   Take these medicines the morning of surgery with A SIP OF WATER: ASA carvedilol (COREG) pantoprazole (PROTONIX)  tamsulosin (FLOMAX)  If needed: Tylenol.  WHAT DO I DO ABOUT MY DIABETES MEDICATION? Do not take Metformin the day of surgery. Do not take Dulaglutide (TRULICITY) the day of surgery  If your CBG is greater than 220 mg/dL, you may take  of your sliding scale (correction) dose of insulin.  Check your blood sugar the morning of your surgery when you wake up and every 2 hours until you get to the Short Stay unit. If your blood sugar is less than 70 mg/dL, you will need to treat for low blood sugar: Treat a low blood sugar (less than 70 mg/dL) with 4 glucose tablets, OR glucose gel or you facility's treatment for hypoglycemia . That is not food or liquid. Recheck blood sugar in 15 minutes after treatment (to make sure it is greater than 70 mg/dL). If your blood sugar is not greater than 70 mg/dL on recheck, call [phone]  for further instructions. Report your blood sugar to the short stay nurse when you get to Short Stay.  [name] should have a shower or good bed bath, with antibacteria soap, the morning of surgery . Dry off with a clean towel.  Patient should not have lotions, powders, colognes, deodorant, jewelry, or piercing's. Wear clean comfortable clothes.  Brush teeth. Men may shave face and  neck.   Do not bring valuables to the hospital.  [facility] is not responsible for any belongings or valuables.  Contacts, dentures or bridgework may not be worn into surgery.

## 2022-02-15 NOTE — Progress Notes (Addendum)
I spoke to [name]'s nurse at [facility].  April reports that patient is afebrile, BP and Pulse are WNL. CBGs have been around 200.  April reports that [name] can pivot to transfer, with someone standing by, I faxed April the pre- procedure instructions, April said that patient will not be taken any medications Thursday am.

## 2022-02-17 ENCOUNTER — Encounter (HOSPITAL_COMMUNITY): Payer: Self-pay | Admitting: Vascular Surgery

## 2022-02-17 ENCOUNTER — Other Ambulatory Visit: Payer: Self-pay

## 2022-02-17 ENCOUNTER — Inpatient Hospital Stay (HOSPITAL_COMMUNITY): Payer: Medicaid Other

## 2022-02-17 ENCOUNTER — Inpatient Hospital Stay (HOSPITAL_COMMUNITY)
Admission: RE | Admit: 2022-02-17 | Discharge: 2022-02-25 | DRG: 240 | Disposition: A | Discharge status: Skilled Nursing Facility | Payer: Medicaid Other | Attending: Vascular Surgery | Admitting: Vascular Surgery

## 2022-02-17 ENCOUNTER — Encounter (HOSPITAL_COMMUNITY)
Admission: RE | Disposition: A | Discharge status: Skilled Nursing Facility | Payer: Self-pay | Source: Home / Self Care | Attending: Vascular Surgery

## 2022-02-17 ENCOUNTER — Inpatient Hospital Stay (HOSPITAL_COMMUNITY): Payer: Medicaid Other | Admitting: Anesthesiology

## 2022-02-17 DIAGNOSIS — Z823 Family history of stroke: Secondary | ICD-10-CM

## 2022-02-17 DIAGNOSIS — F0390 Unspecified dementia without behavioral disturbance: Secondary | ICD-10-CM | POA: Diagnosis present

## 2022-02-17 DIAGNOSIS — Z8673 Personal history of transient ischemic attack (TIA), and cerebral infarction without residual deficits: Secondary | ICD-10-CM | POA: Diagnosis not present

## 2022-02-17 DIAGNOSIS — Z7985 Long-term (current) use of injectable non-insulin antidiabetic drugs: Secondary | ICD-10-CM | POA: Diagnosis not present

## 2022-02-17 DIAGNOSIS — Z7984 Long term (current) use of oral hypoglycemic drugs: Secondary | ICD-10-CM | POA: Diagnosis not present

## 2022-02-17 DIAGNOSIS — I10 Essential (primary) hypertension: Secondary | ICD-10-CM

## 2022-02-17 DIAGNOSIS — R339 Retention of urine, unspecified: Secondary | ICD-10-CM | POA: Diagnosis not present

## 2022-02-17 DIAGNOSIS — I739 Peripheral vascular disease, unspecified: Secondary | ICD-10-CM | POA: Diagnosis present

## 2022-02-17 DIAGNOSIS — Z91014 Allergy to mammalian meats: Secondary | ICD-10-CM | POA: Diagnosis not present

## 2022-02-17 DIAGNOSIS — D649 Anemia, unspecified: Secondary | ICD-10-CM | POA: Diagnosis not present

## 2022-02-17 DIAGNOSIS — E1152 Type 2 diabetes mellitus with diabetic peripheral angiopathy with gangrene: Secondary | ICD-10-CM | POA: Diagnosis present

## 2022-02-17 DIAGNOSIS — I70261 Atherosclerosis of native arteries of extremities with gangrene, right leg: Secondary | ICD-10-CM

## 2022-02-17 DIAGNOSIS — Z794 Long term (current) use of insulin: Secondary | ICD-10-CM

## 2022-02-17 DIAGNOSIS — I70221 Atherosclerosis of native arteries of extremities with rest pain, right leg: Secondary | ICD-10-CM | POA: Diagnosis present

## 2022-02-17 DIAGNOSIS — Z79899 Other long term (current) drug therapy: Secondary | ICD-10-CM | POA: Diagnosis not present

## 2022-02-17 DIAGNOSIS — D62 Acute posthemorrhagic anemia: Secondary | ICD-10-CM | POA: Diagnosis not present

## 2022-02-17 DIAGNOSIS — Z91013 Allergy to seafood: Secondary | ICD-10-CM | POA: Diagnosis not present

## 2022-02-17 DIAGNOSIS — F17210 Nicotine dependence, cigarettes, uncomplicated: Secondary | ICD-10-CM | POA: Diagnosis present

## 2022-02-17 HISTORY — PX: BYPASS GRAFT FEMORAL-PERONEAL: SHX5762

## 2022-02-17 HISTORY — PX: ENDARTERECTOMY FEMORAL: SHX5804

## 2022-02-17 HISTORY — PX: AMPUTATION: SHX166

## 2022-02-17 HISTORY — PX: INSERTION OF ILIAC STENT: SHX6256

## 2022-02-17 HISTORY — PX: LOWER EXTREMITY ANGIOGRAM: SHX5508

## 2022-02-17 LAB — COMPREHENSIVE METABOLIC PANEL
ALT: 10 U/L (ref 0–44)
AST: 11 U/L — ABNORMAL LOW (ref 15–41)
Albumin: 3 g/dL — ABNORMAL LOW (ref 3.5–5.0)
Alkaline Phosphatase: 73 U/L (ref 38–126)
Anion gap: 15 (ref 5–15)
BUN: 17 mg/dL (ref 6–20)
CO2: 22 mmol/L (ref 22–32)
Calcium: 9.8 mg/dL (ref 8.9–10.3)
Chloride: 106 mmol/L (ref 98–111)
Creatinine, Ser: 1.06 mg/dL (ref 0.61–1.24)
GFR, Estimated: >60 mL/min (ref >60)
Glucose, Bld: 116 mg/dL — ABNORMAL HIGH (ref 70–99)
Potassium: 3.3 mmol/L — ABNORMAL LOW (ref 3.5–5.1)
Sodium: 143 mmol/L (ref 135–145)
Total Bilirubin: 0.5 mg/dL (ref 0.3–1.2)
Total Protein: 7.1 g/dL (ref 6.5–8.1)

## 2022-02-17 LAB — GLUCOSE, CAPILLARY
Glucose-Capillary: 120 mg/dL — ABNORMAL HIGH (ref 70–99)
Glucose-Capillary: 124 mg/dL — ABNORMAL HIGH (ref 70–99)
Glucose-Capillary: 144 mg/dL — ABNORMAL HIGH (ref 70–99)
Glucose-Capillary: 157 mg/dL — ABNORMAL HIGH (ref 70–99)

## 2022-02-17 LAB — CBC
HCT: 29.9 % — ABNORMAL LOW (ref 39.0–52.0)
Hemoglobin: 9.7 g/dL — ABNORMAL LOW (ref 13.0–17.0)
MCH: 29.9 pg (ref 26.0–34.0)
MCHC: 32.4 g/dL (ref 30.0–36.0)
MCV: 92.3 fL (ref 80.0–100.0)
Platelets: 452 K/uL — ABNORMAL HIGH (ref 150–400)
RBC: 3.24 MIL/uL — ABNORMAL LOW (ref 4.22–5.81)
RDW: 13.6 % (ref 11.5–15.5)
WBC: 9.2 K/uL (ref 4.0–10.5)
nRBC: 0 % (ref 0.0–0.2)

## 2022-02-17 LAB — PROTIME-INR
INR: 1 (ref 0.8–1.2)
Prothrombin Time: 13.5 s (ref 11.4–15.2)

## 2022-02-17 LAB — SURGICAL PCR SCREEN
MRSA, PCR: NEGATIVE
Staphylococcus aureus: NEGATIVE

## 2022-02-17 LAB — APTT: aPTT: 29 seconds (ref 24–36)

## 2022-02-17 SURGERY — CREATION, BYPASS, ARTERIAL, FEMORAL TO PERONEAL, USING GRAFT
Anesthesia: General | Site: Groin | Laterality: Right

## 2022-02-17 MED ORDER — HEPARIN SODIUM (PORCINE) 1000 UNIT/ML IJ SOLN
INTRAMUSCULAR | Status: DC | PRN
  Administered 2022-02-17: 3000 [IU] via INTRAVENOUS
  Administered 2022-02-17: 4000 [IU] via INTRAVENOUS
  Administered 2022-02-17: 8000 [IU] via INTRAVENOUS

## 2022-02-17 MED ORDER — HEPARIN 6000 UNIT IRRIGATION SOLUTION
Status: AC
  Filled 2022-02-17: qty 500

## 2022-02-17 MED ORDER — CLOPIDOGREL BISULFATE 75 MG PO TABS
75.0000 mg | ORAL_TABLET | Freq: Every day | ORAL | Status: DC
  Administered 2022-02-18 – 2022-02-25 (×7): 75 mg via ORAL
  Filled 2022-02-17 (×8): qty 1

## 2022-02-17 MED ORDER — THIAMINE MONONITRATE 100 MG PO TABS
100.0000 mg | ORAL_TABLET | Freq: Every day | ORAL | Status: DC
  Administered 2022-02-17 – 2022-02-25 (×9): 100 mg via ORAL
  Filled 2022-02-17 (×9): qty 1

## 2022-02-17 MED ORDER — SUGAMMADEX SODIUM 200 MG/2ML IV SOLN
INTRAVENOUS | Status: DC | PRN
  Administered 2022-02-17 (×2): 100 mg via INTRAVENOUS

## 2022-02-17 MED ORDER — ASPIRIN 325 MG PO TABS
325.0000 mg | ORAL_TABLET | Freq: Every day | ORAL | Status: DC
  Administered 2022-02-17 – 2022-02-25 (×9): 325 mg via ORAL
  Filled 2022-02-17 (×9): qty 1

## 2022-02-17 MED ORDER — SODIUM CHLORIDE 0.9 % IV SOLN: INTRAVENOUS | Status: DC

## 2022-02-17 MED ORDER — ONDANSETRON HCL 4 MG/2ML IJ SOLN
INTRAMUSCULAR | Status: AC
  Filled 2022-02-17: qty 2

## 2022-02-17 MED ORDER — LOSARTAN POTASSIUM 50 MG PO TABS
50.0000 mg | ORAL_TABLET | Freq: Every day | ORAL | Status: DC
  Administered 2022-02-17 – 2022-02-25 (×9): 50 mg via ORAL
  Filled 2022-02-17 (×9): qty 1

## 2022-02-17 MED ORDER — MIDAZOLAM HCL 2 MG/2ML IJ SOLN
INTRAMUSCULAR | Status: AC
  Filled 2022-02-17: qty 2

## 2022-02-17 MED ORDER — ONDANSETRON HCL 4 MG/2ML IJ SOLN: 4.0000 mg | Freq: Four times a day (QID) | INTRAMUSCULAR | Status: DC | PRN

## 2022-02-17 MED ORDER — CEFAZOLIN SODIUM-DEXTROSE 2-4 GM/100ML-% IV SOLN
2.0000 g | INTRAVENOUS | Status: AC
  Administered 2022-02-17 (×2): 2 g via INTRAVENOUS
  Filled 2022-02-17: qty 100

## 2022-02-17 MED ORDER — INSULIN ASPART 100 UNIT/ML IJ SOLN
0.0000 [IU] | Freq: Three times a day (TID) | INTRAMUSCULAR | Status: DC
  Administered 2022-02-17: 2 [IU] via SUBCUTANEOUS
  Administered 2022-02-18 (×2): 3 [IU] via SUBCUTANEOUS
  Administered 2022-02-18: 2 [IU] via SUBCUTANEOUS
  Administered 2022-02-19 (×2): 3 [IU] via SUBCUTANEOUS
  Administered 2022-02-20: 2 [IU] via SUBCUTANEOUS
  Administered 2022-02-20: 3 [IU] via SUBCUTANEOUS
  Administered 2022-02-21: 2 [IU] via SUBCUTANEOUS
  Administered 2022-02-21: 3 [IU] via SUBCUTANEOUS
  Administered 2022-02-21 – 2022-02-22 (×3): 2 [IU] via SUBCUTANEOUS
  Administered 2022-02-22: 3 [IU] via SUBCUTANEOUS
  Administered 2022-02-23: 5 [IU] via SUBCUTANEOUS
  Administered 2022-02-23 (×2): 3 [IU] via SUBCUTANEOUS
  Administered 2022-02-24: 2 [IU] via SUBCUTANEOUS
  Administered 2022-02-24 (×2): 3 [IU] via SUBCUTANEOUS
  Administered 2022-02-25: 2 [IU] via SUBCUTANEOUS
  Administered 2022-02-25: 3 [IU] via SUBCUTANEOUS

## 2022-02-17 MED ORDER — FENTANYL CITRATE (PF) 100 MCG/2ML IJ SOLN: 25.0000 ug | INTRAMUSCULAR | Status: DC | PRN

## 2022-02-17 MED ORDER — OXYCODONE HCL 5 MG PO TABS: 5.0000 mg | ORAL_TABLET | Freq: Once | ORAL | Status: DC | PRN

## 2022-02-17 MED ORDER — PROPOFOL 10 MG/ML IV BOLUS
INTRAVENOUS | Status: AC
  Filled 2022-02-17: qty 20

## 2022-02-17 MED ORDER — SODIUM CHLORIDE 0.9 % IV SOLN: 500.0000 mL | Freq: Once | INTRAVENOUS | Status: DC | PRN

## 2022-02-17 MED ORDER — PROPOFOL 10 MG/ML IV BOLUS
INTRAVENOUS | Status: DC | PRN
  Administered 2022-02-17: 150 mg via INTRAVENOUS

## 2022-02-17 MED ORDER — ACETAMINOPHEN 650 MG RE SUPP: 325.0000 mg | RECTAL | Status: DC | PRN

## 2022-02-17 MED ORDER — HEPARIN SODIUM (PORCINE) 5000 UNIT/ML IJ SOLN
5000.0000 [IU] | Freq: Three times a day (TID) | INTRAMUSCULAR | Status: DC
  Administered 2022-02-17 – 2022-02-25 (×21): 5000 [IU] via SUBCUTANEOUS
  Filled 2022-02-17 (×23): qty 1

## 2022-02-17 MED ORDER — TAMSULOSIN HCL 0.4 MG PO CAPS
0.8000 mg | ORAL_CAPSULE | Freq: Every day | ORAL | Status: DC
  Administered 2022-02-17 – 2022-02-25 (×9): 0.8 mg via ORAL
  Filled 2022-02-17 (×9): qty 2

## 2022-02-17 MED ORDER — CARVEDILOL 12.5 MG PO TABS
12.5000 mg | ORAL_TABLET | Freq: Two times a day (BID) | ORAL | Status: DC
  Administered 2022-02-17 – 2022-02-25 (×17): 12.5 mg via ORAL
  Filled 2022-02-17 (×17): qty 1

## 2022-02-17 MED ORDER — FENTANYL CITRATE (PF) 250 MCG/5ML IJ SOLN
INTRAMUSCULAR | Status: AC
  Filled 2022-02-17: qty 5

## 2022-02-17 MED ORDER — PHENOL 1.4 % MT LIQD: 1.0000 | OROMUCOSAL | Status: DC | PRN

## 2022-02-17 MED ORDER — GUAIFENESIN-DM 100-10 MG/5ML PO SYRP: 15.0000 mL | ORAL_SOLUTION | ORAL | Status: DC | PRN

## 2022-02-17 MED ORDER — PANTOPRAZOLE SODIUM 40 MG PO TBEC
40.0000 mg | DELAYED_RELEASE_TABLET | Freq: Every day | ORAL | Status: DC
  Administered 2022-02-17 – 2022-02-25 (×9): 40 mg via ORAL
  Filled 2022-02-17 (×9): qty 1

## 2022-02-17 MED ORDER — CHLORHEXIDINE GLUCONATE CLOTH 2 % EX PADS: 6.0000 | MEDICATED_PAD | Freq: Once | CUTANEOUS | Status: DC

## 2022-02-17 MED ORDER — ROCURONIUM BROMIDE 10 MG/ML (PF) SYRINGE
PREFILLED_SYRINGE | INTRAVENOUS | Status: AC
  Filled 2022-02-17: qty 10

## 2022-02-17 MED ORDER — CARVEDILOL 12.5 MG PO TABS
ORAL_TABLET | ORAL | Status: AC
  Administered 2022-02-17: 12.5 mg via ORAL
  Filled 2022-02-17: qty 1

## 2022-02-17 MED ORDER — PHENYLEPHRINE HCL (PRESSORS) 10 MG/ML IV SOLN
INTRAVENOUS | Status: DC | PRN
  Administered 2022-02-17: 80 ug via INTRAVENOUS

## 2022-02-17 MED ORDER — POTASSIUM CHLORIDE CRYS ER 20 MEQ PO TBCR: 20.0000 meq | EXTENDED_RELEASE_TABLET | Freq: Every day | ORAL | Status: DC | PRN

## 2022-02-17 MED ORDER — DEXAMETHASONE SODIUM PHOSPHATE 10 MG/ML IJ SOLN
INTRAMUSCULAR | Status: AC
  Filled 2022-02-17: qty 1

## 2022-02-17 MED ORDER — HEMOSTATIC AGENTS (NO CHARGE) OPTIME
TOPICAL | Status: DC | PRN
  Administered 2022-02-17: 1 via TOPICAL

## 2022-02-17 MED ORDER — ONDANSETRON HCL 4 MG/2ML IJ SOLN
INTRAMUSCULAR | Status: DC | PRN
  Administered 2022-02-17: 4 mg via INTRAVENOUS

## 2022-02-17 MED ORDER — CARVEDILOL 12.5 MG PO TABS: 12.5000 mg | ORAL_TABLET | Freq: Once | ORAL | Status: AC

## 2022-02-17 MED ORDER — ROCURONIUM BROMIDE 10 MG/ML (PF) SYRINGE
PREFILLED_SYRINGE | INTRAVENOUS | Status: DC | PRN
  Administered 2022-02-17: 60 mg via INTRAVENOUS

## 2022-02-17 MED ORDER — PHENYLEPHRINE HCL-NACL 20-0.9 MG/250ML-% IV SOLN
INTRAVENOUS | Status: DC | PRN
  Administered 2022-02-17: 30 ug/min via INTRAVENOUS

## 2022-02-17 MED ORDER — LABETALOL HCL 5 MG/ML IV SOLN
10.0000 mg | INTRAVENOUS | Status: DC | PRN
  Administered 2022-02-23: 10 mg via INTRAVENOUS
  Filled 2022-02-17: qty 4

## 2022-02-17 MED ORDER — DUTASTERIDE 0.5 MG PO CAPS
0.5000 mg | ORAL_CAPSULE | Freq: Every day | ORAL | Status: DC
  Administered 2022-02-17 – 2022-02-25 (×9): 0.5 mg via ORAL
  Filled 2022-02-17 (×10): qty 1

## 2022-02-17 MED ORDER — METFORMIN HCL 500 MG PO TABS
1000.0000 mg | ORAL_TABLET | Freq: Two times a day (BID) | ORAL | Status: DC
  Administered 2022-02-17 – 2022-02-25 (×17): 1000 mg via ORAL
  Filled 2022-02-17 (×17): qty 2

## 2022-02-17 MED ORDER — 0.9 % SODIUM CHLORIDE (POUR BTL) OPTIME
TOPICAL | Status: DC | PRN
  Administered 2022-02-17: 1000 mL

## 2022-02-17 MED ORDER — FENTANYL CITRATE (PF) 250 MCG/5ML IJ SOLN
INTRAMUSCULAR | Status: DC | PRN
  Administered 2022-02-17: 150 ug via INTRAVENOUS
  Administered 2022-02-17: 25 ug via INTRAVENOUS
  Administered 2022-02-17: 50 ug via INTRAVENOUS
  Administered 2022-02-17: 25 ug via INTRAVENOUS
  Administered 2022-02-17: 50 ug via INTRAVENOUS

## 2022-02-17 MED ORDER — PHENYLEPHRINE 80 MCG/ML (10ML) SYRINGE FOR IV PUSH (FOR BLOOD PRESSURE SUPPORT)
PREFILLED_SYRINGE | INTRAVENOUS | Status: DC | PRN
  Administered 2022-02-17: 120 ug via INTRAVENOUS
  Administered 2022-02-17: 80 ug via INTRAVENOUS

## 2022-02-17 MED ORDER — ATORVASTATIN CALCIUM 80 MG PO TABS
80.0000 mg | ORAL_TABLET | Freq: Every day | ORAL | Status: DC
  Administered 2022-02-17 – 2022-02-24 (×8): 80 mg via ORAL
  Filled 2022-02-17 (×7): qty 1

## 2022-02-17 MED ORDER — MORPHINE SULFATE (PF) 2 MG/ML IV SOLN
2.0000 mg | INTRAVENOUS | Status: DC | PRN
  Administered 2022-02-17: 2 mg via INTRAVENOUS
  Filled 2022-02-17: qty 1

## 2022-02-17 MED ORDER — LACTATED RINGERS IV SOLN: INTRAVENOUS | Status: DC | PRN

## 2022-02-17 MED ORDER — CHLORHEXIDINE GLUCONATE 0.12 % MT SOLN
OROMUCOSAL | Status: AC
  Administered 2022-02-17: 15 mL
  Filled 2022-02-17: qty 15

## 2022-02-17 MED ORDER — METOPROLOL TARTRATE 5 MG/5ML IV SOLN: 2.0000 mg | INTRAVENOUS | Status: DC | PRN

## 2022-02-17 MED ORDER — DOCUSATE SODIUM 100 MG PO CAPS
100.0000 mg | ORAL_CAPSULE | Freq: Every day | ORAL | Status: DC
  Administered 2022-02-18 – 2022-02-25 (×8): 100 mg via ORAL
  Filled 2022-02-17 (×7): qty 1

## 2022-02-17 MED ORDER — MIDAZOLAM HCL 2 MG/2ML IJ SOLN
INTRAMUSCULAR | Status: DC | PRN
  Administered 2022-02-17: 1 mg via INTRAVENOUS

## 2022-02-17 MED ORDER — HYDRALAZINE HCL 20 MG/ML IJ SOLN: 5.0000 mg | INTRAMUSCULAR | Status: DC | PRN

## 2022-02-17 MED ORDER — PHENYLEPHRINE 80 MCG/ML (10ML) SYRINGE FOR IV PUSH (FOR BLOOD PRESSURE SUPPORT)
PREFILLED_SYRINGE | INTRAVENOUS | Status: AC
  Filled 2022-02-17: qty 10

## 2022-02-17 MED ORDER — HEPARIN 6000 UNIT IRRIGATION SOLUTION
Status: DC | PRN
  Administered 2022-02-17 (×2): 1

## 2022-02-17 MED ORDER — INSULIN ASPART 100 UNIT/ML IJ SOLN: 0.0000 [IU] | Freq: Three times a day (TID) | INTRAMUSCULAR | Status: DC

## 2022-02-17 MED ORDER — BISACODYL 10 MG RE SUPP
10.0000 mg | Freq: Every day | RECTAL | Status: DC | PRN
  Filled 2022-02-17: qty 1

## 2022-02-17 MED ORDER — MAGNESIUM SULFATE 2 GM/50ML IV SOLN: 2.0000 g | Freq: Every day | INTRAVENOUS | Status: DC | PRN

## 2022-02-17 MED ORDER — OXYCODONE-ACETAMINOPHEN 5-325 MG PO TABS
1.0000 | ORAL_TABLET | ORAL | Status: DC | PRN
  Administered 2022-02-17 – 2022-02-24 (×10): 2 via ORAL
  Filled 2022-02-17 (×11): qty 2

## 2022-02-17 MED ORDER — ACETAMINOPHEN 325 MG PO TABS: 325.0000 mg | ORAL_TABLET | ORAL | Status: DC | PRN

## 2022-02-17 MED ORDER — ALUM & MAG HYDROXIDE-SIMETH 200-200-20 MG/5ML PO SUSP: 15.0000 mL | ORAL | Status: DC | PRN

## 2022-02-17 MED ORDER — DULAGLUTIDE 0.75 MG/0.5ML ~~LOC~~ SOAJ: 0.7500 mg | SUBCUTANEOUS | Status: DC

## 2022-02-17 MED ORDER — OXYCODONE HCL 5 MG/5ML PO SOLN: 5.0000 mg | Freq: Once | ORAL | Status: DC | PRN

## 2022-02-17 MED ORDER — POLYETHYLENE GLYCOL 3350 17 G PO PACK: 17.0000 g | PACK | Freq: Every day | ORAL | Status: DC | PRN

## 2022-02-17 MED ORDER — CEFAZOLIN SODIUM-DEXTROSE 2-4 GM/100ML-% IV SOLN
2.0000 g | Freq: Three times a day (TID) | INTRAVENOUS | Status: AC
  Administered 2022-02-17 – 2022-02-18 (×2): 2 g via INTRAVENOUS
  Filled 2022-02-17 (×2): qty 100

## 2022-02-17 MED ORDER — LIDOCAINE 2% (20 MG/ML) 5 ML SYRINGE
INTRAMUSCULAR | Status: DC | PRN
  Administered 2022-02-17: 60 mg via INTRAVENOUS

## 2022-02-17 MED ORDER — DEXAMETHASONE SODIUM PHOSPHATE 10 MG/ML IJ SOLN
INTRAMUSCULAR | Status: DC | PRN
  Administered 2022-02-17: 5 mg via INTRAVENOUS

## 2022-02-17 MED ORDER — LIDOCAINE 2% (20 MG/ML) 5 ML SYRINGE
INTRAMUSCULAR | Status: AC
  Filled 2022-02-17: qty 5

## 2022-02-17 SURGICAL SUPPLY — 88 items
BAG COUNTER SPONGE SURGICOUNT (BAG) ×3 IMPLANT
BALLN STERLING SL OTW 2X80X150 (BALLOONS) ×3
BALLOON STRLNG SL OTW 2X80X150 (BALLOONS) IMPLANT
BANDAGE ESMARK 6X9 LF (GAUZE/BANDAGES/DRESSINGS) IMPLANT
BLADE AVERAGE 25X9 (BLADE) IMPLANT
BLADE SAW SGTL 81X20 HD (BLADE) IMPLANT
BNDG ELASTIC 4X5.8 VLCR STR LF (GAUZE/BANDAGES/DRESSINGS) ×3 IMPLANT
BNDG ESMARK 6X9 LF (GAUZE/BANDAGES/DRESSINGS)
BNDG GAUZE DERMACEA FLUFF 4 (GAUZE/BANDAGES/DRESSINGS) ×3 IMPLANT
CANISTER SUCT 3000ML PPV (MISCELLANEOUS) ×3 IMPLANT
CANNULA VESSEL 3MM 2 BLNT TIP (CANNULA) IMPLANT
CATH CXI SUPP 2.6F 150 ANG (CATHETERS) IMPLANT
CATH STRAIGHT 5FR 65CM (CATHETERS) IMPLANT
CLIP LIGATING EXTRA MED SLVR (CLIP) ×3 IMPLANT
CLIP LIGATING EXTRA SM BLUE (MISCELLANEOUS) ×3 IMPLANT
COVER PROBE W GEL 5X96 (DRAPES) IMPLANT
COVER SURGICAL LIGHT HANDLE (MISCELLANEOUS) ×3 IMPLANT
CUFF TOURN SGL QUICK 24 (TOURNIQUET CUFF)
CUFF TOURN SGL QUICK 34 (TOURNIQUET CUFF)
CUFF TOURN SGL QUICK 42 (TOURNIQUET CUFF) IMPLANT
CUFF TRNQT CYL 24X4X16.5-23 (TOURNIQUET CUFF) IMPLANT
CUFF TRNQT CYL 34X4.125X (TOURNIQUET CUFF) IMPLANT
DERMABOND ADVANCED .7 DNX12 (GAUZE/BANDAGES/DRESSINGS) ×3 IMPLANT
DERMABOND ADVANCED .7 DNX6 (GAUZE/BANDAGES/DRESSINGS) IMPLANT
DRAIN CHANNEL 15F RND FF W/TCR (WOUND CARE) IMPLANT
DRAPE C-ARM 42X72 X-RAY (DRAPES) IMPLANT
DRAPE EXTREMITY T 121X128X90 (DISPOSABLE) ×3 IMPLANT
DRAPE HALF SHEET 40X57 (DRAPES) ×3 IMPLANT
DRSG COVADERM 4X6 (GAUZE/BANDAGES/DRESSINGS) IMPLANT
DRSG COVADERM 4X8 (GAUZE/BANDAGES/DRESSINGS) IMPLANT
DRSG EMULSION OIL 3X3 NADH (GAUZE/BANDAGES/DRESSINGS) IMPLANT
ELECT REM PT RETURN 9FT ADLT (ELECTROSURGICAL) ×3
ELECTRODE REM PT RTRN 9FT ADLT (ELECTROSURGICAL) ×3 IMPLANT
EVACUATOR SILICONE 100CC (DRAIN) IMPLANT
GAUZE SPONGE 4X4 12PLY STRL (GAUZE/BANDAGES/DRESSINGS) ×3 IMPLANT
GLOVE BIO SURGEON STRL SZ7.5 (GLOVE) ×3 IMPLANT
GOWN STRL REUS W/ TWL LRG LVL3 (GOWN DISPOSABLE) ×6 IMPLANT
GOWN STRL REUS W/ TWL XL LVL3 (GOWN DISPOSABLE) ×3 IMPLANT
GOWN STRL REUS W/TWL LRG LVL3 (GOWN DISPOSABLE) ×6
GOWN STRL REUS W/TWL XL LVL3 (GOWN DISPOSABLE) ×3
HEMOSTAT SNOW SURGICEL 2X4 (HEMOSTASIS) IMPLANT
INSERT FOGARTY SM (MISCELLANEOUS) IMPLANT
KIT BASIN OR (CUSTOM PROCEDURE TRAY) ×3 IMPLANT
KIT ENCORE 26 ADVANTAGE (KITS) IMPLANT
KIT TURNOVER KIT B (KITS) ×3 IMPLANT
MARKER GRAFT CORONARY BYPASS (MISCELLANEOUS) IMPLANT
NDL HYPO 25GX1X1/2 BEV (NEEDLE) IMPLANT
NEEDLE HYPO 25GX1X1/2 BEV (NEEDLE) IMPLANT
NS IRRIG 1000ML POUR BTL (IV SOLUTION) ×6 IMPLANT
PACK GENERAL/GYN (CUSTOM PROCEDURE TRAY) ×3 IMPLANT
PACK PERIPHERAL VASCULAR (CUSTOM PROCEDURE TRAY) ×3 IMPLANT
PAD ARMBOARD 7.5X6 YLW CONV (MISCELLANEOUS) ×6 IMPLANT
POWDER SURGICEL 3.0 GRAM (HEMOSTASIS) IMPLANT
SET MICROPUNCTURE 5F STIFF (MISCELLANEOUS) IMPLANT
SHEATH PINNACLE 5F 10CM (SHEATH) IMPLANT
SLEEVE SURGEON STRL (DRAPES) IMPLANT
SPONGE T-LAP 18X18 ~~LOC~~+RFID (SPONGE) IMPLANT
STAPLER VISISTAT 35W (STAPLE) IMPLANT
STENT SYNERGY XD 2.50X48 (Permanent Stent) IMPLANT
STOPCOCK 4 WAY LG BORE MALE ST (IV SETS) IMPLANT
STOPCOCK MORSE 400PSI 3WAY (MISCELLANEOUS) IMPLANT
SUT ETHILON 2 0 PSLX (SUTURE) IMPLANT
SUT ETHILON 3 0 PS 1 (SUTURE) ×3 IMPLANT
SUT MNCRL AB 4-0 PS2 18 (SUTURE) ×6 IMPLANT
SUT PROLENE 5 0 C 1 24 (SUTURE) ×3 IMPLANT
SUT PROLENE 6 0 BV (SUTURE) ×3 IMPLANT
SUT PROLENE 7 0 BV 1 (SUTURE) IMPLANT
SUT SILK 2 0 PERMA HAND 18 BK (SUTURE) IMPLANT
SUT SILK 2 0 SH (SUTURE) ×3 IMPLANT
SUT SILK 3 0 (SUTURE) ×3
SUT SILK 3-0 18XBRD TIE 12 (SUTURE) IMPLANT
SUT VIC AB 2-0 CT1 27 (SUTURE) ×6
SUT VIC AB 2-0 CT1 TAPERPNT 27 (SUTURE) ×6 IMPLANT
SUT VIC AB 3-0 SH 27 (SUTURE) ×21
SUT VIC AB 3-0 SH 27X BRD (SUTURE) ×6 IMPLANT
SYNERGY XD 2.50X48 (Permanent Stent) ×3 IMPLANT
SYR 10ML LL (SYRINGE) IMPLANT
SYR 20ML LL LF (SYRINGE) IMPLANT
SYR CONTROL 10ML LL (SYRINGE) IMPLANT
TAPE UMBILICAL COTTON 1/8X30 (MISCELLANEOUS) IMPLANT
TOWEL GREEN STERILE (TOWEL DISPOSABLE) ×6 IMPLANT
TRAY FOLEY MTR SLVR 16FR STAT (SET/KITS/TRAYS/PACK) ×3 IMPLANT
TUBING CIL FLEX 10 FLL-RA (TUBING) IMPLANT
TUBING EXTENTION W/L.L. (IV SETS) IMPLANT
UNDERPAD 30X36 HEAVY ABSORB (UNDERPADS AND DIAPERS) ×3 IMPLANT
WATER STERILE IRR 1000ML POUR (IV SOLUTION) ×3 IMPLANT
WIRE G V18X300 ST (WIRE) IMPLANT
WIRE SPARTACORE .014X300CM (WIRE) IMPLANT

## 2022-02-17 NOTE — Transfer of Care (Signed)
Immediate Anesthesia Transfer of Care Note  Patient: Ryan Hughes  Procedure(s) Performed: Right BYPASS GRAFT FEMORAL-PERONEAL (Right) AMPUTATION DIGIT GREAT AND SECOND TOES (Right) ENDARTERECTOMY FEMORAL RIGHT (Right: Groin) LOWER EXTREMITY ANGIOGRAM INSERTION OF PERONEAL STENT  Patient Location: PACU  Anesthesia Type:General  Level of Consciousness: awake and drowsy  Airway & Oxygen Therapy: Patient Spontanous Breathing and Patient connected to nasal cannula oxygen  Post-op Assessment: Report given to RN and Post -op Vital signs reviewed and stable  Post vital signs: Reviewed and stable  Last Vitals:  Vitals Value Taken Time  BP 146/79 02/17/22 1327  Temp    Pulse 83 02/17/22 1330  Resp 16 02/17/22 1330  SpO2 97 % 02/17/22 1330  Vitals shown include unvalidated device data.  Last Pain:  Vitals:   02/17/22 0643  TempSrc:   PainSc: 6       Patients Stated Pain Goal: 0 (41/58/30 9407)  Complications: No notable events documented.

## 2022-02-17 NOTE — Anesthesia Procedure Notes (Signed)
Procedure Name: Intubation Date/Time: 02/17/2022 7:44 AM  Performed by: Trinna Post., CRNAPre-anesthesia Checklist: Patient identified, Emergency Drugs available, Suction available, Patient being monitored and Timeout performed Patient Re-evaluated:Patient Re-evaluated prior to induction Oxygen Delivery Method: Circle system utilized Preoxygenation: Pre-oxygenation with 100% oxygen Induction Type: IV induction Ventilation: Mask ventilation without difficulty Laryngoscope Size: Mac and 4 Grade View: Grade I Tube type: Oral Tube size: 7.5 mm Number of attempts: 1 Airway Equipment and Method: Stylet Placement Confirmation: ETT inserted through vocal cords under direct vision, positive ETCO2 and breath sounds checked- equal and bilateral Secured at: 23 cm Tube secured with: Tape Dental Injury: Teeth and Oropharynx as per pre-operative assessment

## 2022-02-17 NOTE — Progress Notes (Signed)
Dr. Donzetta Matters made aware that patient was unable to provide a urine specimen for a urinalysis. Ok per Dr. Donzetta Matters.

## 2022-02-17 NOTE — Progress Notes (Signed)
Pt arrived from PACU. Oriented to room and staff. Doppler signal present in right peroneal. CHG bath done. Vitals obtained. Telemetry box applied. CCMD notified.

## 2022-02-17 NOTE — Op Note (Signed)
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 Donzetta Matters, MD Assistant: Vicente Serene, PA Procedure Performed: 1.  Right common femoral endarterectomy 2.  Harvest right greater saphenous vein 3.  Right peroneal artery endarterectomy 4.  Right common femoral to peroneal artery bypass with ipsilateral, translocated, nonreversed greater saphenous vein 5.  Right lower extremity angiography 6.  Stent of right peroneal artery into the bypass with 2.5 x 48 mm Synergy 7.  transmetatarsal amputation right first and second toes   Indications: 56 year old male with history of chronic right lower extremity limb threatening ischemia.  Patient does have dementia and lives in a group home but is active and walks without much limitation.  He is undergoing angiography which demonstrates long segment stenosis of his SFA with occlusion of the popliteal artery and tibial arteries are occluded with one-vessel runoff via reconstituted distal peroneal artery.  We have discussed primary above-knee amputation versus bypass with attempted amputation of the first 2 toes and family has decided for the latter for which he presents today.  Findings: Common femoral artery was diffusely thickened we performed endarterectomy extending up into the external iliac artery and there is very strong inflow.  There was strong backbleeding from the profunda with minimal backbleeding from the SFA.  Saphenous vein was very large throughout its course measuring upwards of 5 mm in the mid thigh down to 3 mm distally and was harvested from the saphenofemoral junction all the way to the ankle.  Distally the peroneal artery did have a signal we elected for bypass to the level but upon opening it and required endarterectomy and further extension  down the peroneal artery and ultimately requiring a patulous bypass with anastomosis over 2 cm long.  At completion there was a high resistance signal with Doppler who performed angiography and ultimately stent to the outflow with a 2.5 mm stent and distally balloon with 2 mm balloon and a completion there is a very strong peroneal artery signal distally at the foot.  The first and second toe amputation sites did have adequate capillary bleeding to suggest healing probability.   Procedure:  The patient was identified in the holding area and taken to the operating room where he was placed about operative and general anesthesia was induced.  He was sterilely prepped and draped in the right lower extremity usual fashion, antibiotics were administered and timeout was called.  Ultrasound was used to mark the greater saphenous vein throughout the upper and lower right leg.  I then made an incision just around the vein in the distal third of the leg and dissected down identified the posterior tibial artery and dropped this down and then identified the peroneal artery with its corresponding veins.  We dissected out the proximal peroneal artery for several centimeters and Doppler was used and appeared to have adequate signal.  Through an additional incision towards the ankle we then dissected out the entirety of the greater saphenous vein back to the groin and then the groin incision was made we dissected down to the common femoral artery up onto the inguinal ligament encircled the external iliac artery as well as multiple side branches as well as the profunda and the SFA.  We then clamped the greater saphenous vein at saphenofemoral junction and transected this and oversewed with 5-0 Prolene suture in running mattress fashion.  At the level of the ankle the saphenous vein was tied off and this was passed off for use later.  A tunnel was placed from the peroneal exposure site to the common femoral artery incision the  patient was fully heparinized.  We then clamped the common femoral outflow followed by inflow and an arteriotomy was performed and we performed extensive endarterectomy extending up under the inguinal ligament and at the level of the common femoral artery and there was good backbleeding from the profunda and the SFA had minimal backbleeding.  The vein was then spatulated and sewn into side with 5-0 Prolene suture.  Upon completion we flushed through the vein and then lysed all of the valves.  This was marked for orientation doubly clipped distally and tunneled anatomically.  Distally we had very strong pulsatility.  I then clamped the inflow of the peroneal artery and opened it longitudinally.  I encountered severe disease there and endarterectomy was required I attempted to pass a dilator but I was concerned that this dissected.  The peroneal artery was then freed up for several more centimeters and I opened the arteriotomy to a total of 2 cm and I did have better backbleeding at that level and I used a 2 dilator to dilate the artery and used this to occlude backbleeding.  Proximally there was only trickle antegrade flow.  The leg was straightened the bypass was trimmed to size and clamped.  I then sewed the bypass in place with 6-0 Prolene suture.  Prior to completion without flushing all directions.  Upon completion there was a waterhammer type signal in the bypass itself.  With this I cannulated the vein in the groin incision in an antegrade fashion with a micropuncture needle followed by wire and sheath.  Angiography initially would not allow visualization of the distal bypass as there was such tight outflow stenosis.  I then primarily ballooned after placing an 018 wire across the anastomosis we ballooned the anastomosis with 2 mm balloon still no outflow.  I then placed an 035 catheter over the 018 wire down to the level anastomosis performed angiography identified the outflow stenosis.  I then exchanged for  an 014 wire and primarily stented with a coronary stent that was 2.5 x 48 mm.  Still there was minimal outflow I then ballooned the distal outflow with 2 mm balloon and there was much improved outflow on completion angiography.  Satisfied that we have done everything possible we then removed the sheath and the catheters and wires and suture-ligated the cannulation site in the vein with 5-0 Prolene suture.  I elected not to reverse heparin.  We obtained hemostasis in all the wounds and closed the above-knee incisions with Vicryl and Monocryl and Dermabond was placed above the skin site.  Below the knee we closed with Vicryl and staples to allow for continued drainage as I did not reverse heparinization.  After dressings were placed we turned our attention towards the foot and a tennis racquet type incision was made around the first 2 toes.  We dissected down to the level of the bone and transected the metatarsal bone of the first toe and second toe with a saw.  The bone edges were smoothed with a rasp.  Sesamoid bones were removed.  Wound was thoroughly irrigated hemostasis was obtained and the skin was trimmed back to healthy appearing skin and then closed with interrupted nylon suture.  A sterile dressing was applied.  Patient was then awakened from anesthesia having  tolerated procedure without any complication.  All counts were correct at completion.   Given the complexity of the case,  the assistant was necessary in order to expedient the procedure and safely perform the technical aspects of the operation.  The assistant provided traction and countertraction to assist with exposure of the artery proximally and distally.  They assisted with suture ligature of multiple branches.  Their assistance was critical in the performance of both the proximal and distal anastomosis.These skills, especially following the Prolene suture for the anastomosis, could not have been adequately performed by a scrub tech assistant.     EBL: 500 cc    Maddax Palinkas C. Donzetta Matters, MD [facility] Office: [phone] Pager: [phone]

## 2022-02-17 NOTE — Anesthesia Preprocedure Evaluation (Signed)
Anesthesia Evaluation  Patient identified by MRN, date of birth, ID band Patient awake    Reviewed: Allergy & Precautions, H&P , NPO status , Patient's Chart, lab work & pertinent test results  Airway Mallampati: II   Neck ROM: full    Dental   Pulmonary Current Smoker,    breath sounds clear to auscultation       Cardiovascular hypertension,  Rhythm:regular Rate:Normal     Neuro/Psych CVA    GI/Hepatic   Endo/Other  diabetes, Type 2  Renal/GU      Musculoskeletal   Abdominal   Peds  Hematology  (+) Blood dyscrasia, anemia ,   Anesthesia Other Findings   Reproductive/Obstetrics                             Anesthesia Physical Anesthesia Plan  ASA: 3  Anesthesia Plan: General   Post-op Pain Management:    Induction: Intravenous  PONV Risk Score and Plan: 1 and Ondansetron, Dexamethasone, Midazolam and Treatment may vary due to age or medical condition  Airway Management Planned: Oral ETT  Additional Equipment:   Intra-op Plan:   Post-operative Plan: Extubation in OR  Informed Consent: I have reviewed the patients History and Physical, chart, labs and discussed the procedure including the risks, benefits and alternatives for the proposed anesthesia with the patient or authorized representative who has indicated his/her understanding and acceptance.     Dental advisory given  Plan Discussed with: CRNA, Anesthesiologist and Surgeon  Anesthesia Plan Comments:         Anesthesia Quick Evaluation

## 2022-02-17 NOTE — H&P (Signed)
H+P       History of Present Illness 56 y.o. male without previous vascular surgery intervention.  He does have diabetes, hypertension and was previously a smoker.  He currently lives in a facility here in [place].  There is no family present to help with conversation he does have some recollection of his history.  States that 2 weeks ago he bumped his right great toe on his wheelchair.  He is able to walk but mostly in a wheelchair at the facility.  Does not take any blood thinners.  Currently having pain in the right foot denies any fevers or chills.       Past Medical History:  Diagnosis Date   Diabetes mellitus without complication (HCC)     Hypertension     Nicotine dependence, cigarettes, uncomplicated 04/25/2020      No past surgical history on file.        Allergies  Allergen Reactions   Pork-Derived Products Other (See Comments)      Causes gout flares   Shellfish-Derived Products Other (See Comments)      Causes gout flares             Prior to Admission medications   Medication Sig Start Date End Date Taking? Authorizing Provider  atorvastatin (LIPITOR) 80 MG tablet Take 1 tablet (80 mg total) by mouth at bedtime. Patient not taking: Reported on 12/18/2021 12/10/21     Grayce Sessions, NP  carvedilol (COREG) 6.25 MG tablet Take 1 tablet (6.25 mg total) by mouth 2 (two) times daily with a meal. 12/20/21     Marinda Elk, MD  Dulaglutide (TRULICITY) 0.75 MG/0.5ML SOPN Inject 0.75 mg into the skin once a week. Patient not taking: Reported on 12/18/2021 12/08/21     Grayce Sessions, NP  dutasteride (AVODART) 0.5 MG capsule Take 1 capsule (0.5 mg total) by mouth daily. 12/20/21     Marinda Elk, MD  folic acid (FOLVITE) 1 MG tablet Take 1 mg by mouth daily. Patient not taking: Reported on 12/18/2021       [provider]  glipiZIDE (GLUCOTROL) 5 MG tablet Take 1 tablet (5 mg total) by mouth 2 (two) times daily. 12/20/21 12/20/22   Marinda Elk, MD  hydrochlorothiazide (HYDRODIURIL) 12.5 MG tablet Take 1 tablet (12.5 mg total) by mouth daily. 12/20/21     Marinda Elk, MD  insulin glargine (LANTUS) 100 UNIT/ML Solostar Pen Inject 15 Units into the skin 2 (two) times daily. Patient not taking: Reported on 12/18/2021 12/08/21     Grayce Sessions, NP  losartan (COZAAR) 50 MG tablet Take 1 tablet (50 mg total) by mouth daily. 12/20/21     Marinda Elk, MD  metFORMIN (GLUCOPHAGE) 1000 MG tablet Take 1 tablet (1,000 mg total) by mouth 2 (two) times daily with a meal. Patient not taking: Reported on 12/18/2021 04/19/21 02/12/22   Grayce Sessions, NP  Multiple Vitamin (MULTIVITAMIN WITH MINERALS) TABS tablet Take 1 tablet by mouth daily. Patient not taking: Reported on 12/08/2021 03/29/21     Glade Lloyd, MD  pantoprazole (PROTONIX) 40 MG tablet Take 1 tablet (40 mg total) by mouth 2 (two) times daily. 12/20/21     Marinda Elk, MD  potassium chloride (KLOR-CON) 10 MEQ tablet Take 2 tablets (20 mEq total) by mouth daily. Patient not taking: Reported on 12/08/2021 04/19/21     Grayce Sessions, NP  tamsulosin (FLOMAX) 0.4 MG CAPS capsule Take 2  capsules (0.8 mg total) by mouth daily. 12/20/21     Charlynne Cousins, MD  thiamine 100 MG tablet Take 1 tablet (100 mg total) by mouth daily. Patient not taking: Reported on 12/18/2021 03/29/21     Aline August, MD      Social History         Socioeconomic History   Marital status: Single      Spouse name: Not on file   Number of children: Not on file   Years of education: Not on file   Highest education level: Not on file  Occupational History   Not on file  Tobacco Use   Smoking status: Every Day      Packs/day: 0.50      Types: Cigarettes   Smokeless tobacco: Never  Vaping Use   Vaping Use: Never used  Substance and Sexual Activity   Alcohol use: Yes   Drug use: Yes      Types: Marijuana   Sexual activity: Yes      Birth control/protection:  Condom  Other Topics Concern   Not on file  Social History Narrative   Not on file    Social Determinants of Health        Financial Resource Strain: Not on file  Food Insecurity: No Food Insecurity (05/06/2020)    Hunger Vital Sign     Worried About Running Out of Food in the Last Year: Never true     Ran Out of Food in the Last Year: Never true  Transportation Needs: No Transportation Needs (05/06/2020)    PRAPARE - Armed forces logistics/support/administrative officer (Medical): No     Lack of Transportation (Non-Medical): No  Physical Activity: Not on file  Stress: Not on file  Social Connections: Not on file  Intimate Partner Violence: Not on file             Family History  Problem Relation Age of Onset   Stroke Mother      Review of Systems  Constitutional: Negative.   HENT: Negative.    Eyes: Negative.   Respiratory: Negative.    Cardiovascular: Negative.   Gastrointestinal: Negative.   Musculoskeletal:        Toe pain  Skin: Negative.   Neurological: Negative.   Endo/Heme/Allergies: Negative.   Psychiatric/Behavioral: Negative.            Physical Examination   Vitals:   02/17/22 0631  BP: 133/72  Pulse: 77  Resp: 18  Temp: 97.7 F (36.5 C)  SpO2: 100%      Physical Exam HENT:     Head: Normocephalic.  Eyes:     Pupils: Pupils are equal, round, and reactive to light.  Cardiovascular:     Pulses:          Femoral pulses are 2+ on the right side and 2+ on the left side.      Popliteal pulses are 0 on the right side and 0 on the left side.       Dorsalis pedis pulses are 0 on the right side and 0 on the left side.       Posterior tibial pulses are 0 on the right side and 0 on the left side.  Abdominal:     General: Abdomen is flat.     Palpations: Abdomen is soft. There is no mass.  Musculoskeletal:        General: Normal range of motion.     Comments:  As pictured below  Neurological:     General: No focal deficit present.     Mental Status: He  is alert.  Psychiatric:        Mood and Affect: Mood normal.        Thought Content: Thought content normal.        Judgment: Judgment normal.              Non-Invasive Vascular Imaging:   ABI Findings:  +---------+------------------+-----+-------------------+--------+  Right    Rt Pressure (mmHg)IndexWaveform           Comment   +---------+------------------+-----+-------------------+--------+  Brachial 147                    triphasic                    +---------+------------------+-----+-------------------+--------+  PTA      69                0.47 dampened monophasic          +---------+------------------+-----+-------------------+--------+  DP       81                0.55 dampened monophasic          +---------+------------------+-----+-------------------+--------+  Great Toe                       Absent                       +---------+------------------+-----+-------------------+--------+   +---------+------------------+-----+----------+-------+  Left     Lt Pressure (mmHg)IndexWaveform  Comment  +---------+------------------+-----+----------+-------+  Brachial 142                    triphasic          +---------+------------------+-----+----------+-------+  PTA      254               1.73 monophasic         +---------+------------------+-----+----------+-------+  DP       254               1.73 monophasic         +---------+------------------+-----+----------+-------+  Great Toe69                0.47                    +---------+------------------+-----+----------+-------+   +-------+---------------------+-----------+------------+------------+  ABI/TBIToday's ABI          Today's TBIPrevious ABIPrevious TBI  +-------+---------------------+-----------+------------+------------+  Right  0.55                 absent                                +-------+---------------------+-----------+------------+------------+  Left   1.73 non compressible0.47                                 +-------+---------------------+-----------+------------+------------+       Summary:  Right: Resting right ankle-brachial index indicates moderate right lower  extremity arterial disease. The right toe-brachial index is abnormal.   Left: Resting left ankle-brachial index indicates noncompressible left  lower extremity arteries. The left toe-brachial index is abnormal.    ASSESSMENT/PLAN: 56 y.o. male with chronic right lower extremity limb threatening ischemia  with depressed ABIs.  Angio demonstrates occluded long segment sfa/popliteal and tibial occlusions. Plan is for attempt at R common femoral endarterectomy peroneal artery bypass today in OR. Appears to have suitable vein both grossly and by recent duplex.    Deshayla Empson C. Randie Heinz, MD [facility] Office: [phone] Pager: [phone]

## 2022-02-17 NOTE — Anesthesia Postprocedure Evaluation (Signed)
Anesthesia Post Note  Patient: Ryan Hughes  Procedure(s) Performed: Right BYPASS GRAFT FEMORAL-PERONEAL (Right) AMPUTATION DIGIT GREAT AND SECOND TOES (Right) ENDARTERECTOMY FEMORAL RIGHT (Right: Groin) LOWER EXTREMITY ANGIOGRAM INSERTION OF PERONEAL STENT     Patient location during evaluation: PACU Anesthesia Type: General Level of consciousness: awake and alert Pain management: pain level controlled Vital Signs Assessment: post-procedure vital signs reviewed and stable Respiratory status: spontaneous breathing, nonlabored ventilation, respiratory function stable and patient connected to nasal cannula oxygen Cardiovascular status: blood pressure returned to baseline and stable Postop Assessment: no apparent nausea or vomiting Anesthetic complications: no   No notable events documented.  Last Vitals:  Vitals:   02/17/22 1618 02/17/22 1638  BP:  137/82  Pulse: 95 96  Resp: 17 20  Temp:  37.3 C  SpO2: 98% 99%    Last Pain:  Vitals:   02/17/22 1638  TempSrc: Oral  PainSc:                  [place]

## 2022-02-18 ENCOUNTER — Encounter (HOSPITAL_COMMUNITY): Payer: Self-pay | Admitting: Vascular Surgery

## 2022-02-18 LAB — BASIC METABOLIC PANEL
Anion gap: 9 (ref 5–15)
BUN: 24 mg/dL — ABNORMAL HIGH (ref 6–20)
CO2: 22 mmol/L (ref 22–32)
Calcium: 8.3 mg/dL — ABNORMAL LOW (ref 8.9–10.3)
Chloride: 110 mmol/L (ref 98–111)
Creatinine, Ser: 1.43 mg/dL — ABNORMAL HIGH (ref 0.61–1.24)
GFR, Estimated: 57 mL/min — ABNORMAL LOW (ref >60)
Glucose, Bld: 121 mg/dL — ABNORMAL HIGH (ref 70–99)
Potassium: 3.3 mmol/L — ABNORMAL LOW (ref 3.5–5.1)
Sodium: 141 mmol/L (ref 135–145)

## 2022-02-18 LAB — GLUCOSE, CAPILLARY
Glucose-Capillary: 158 mg/dL — ABNORMAL HIGH (ref 70–99)
Glucose-Capillary: 137 mg/dL — ABNORMAL HIGH (ref 70–99)
Glucose-Capillary: 170 mg/dL — ABNORMAL HIGH (ref 70–99)
Glucose-Capillary: 125 mg/dL — ABNORMAL HIGH (ref 70–99)

## 2022-02-18 LAB — CBC
HCT: 21.8 % — ABNORMAL LOW (ref 39.0–52.0)
Hemoglobin: 7.3 g/dL — ABNORMAL LOW (ref 13.0–17.0)
MCH: 29.9 pg (ref 26.0–34.0)
MCHC: 33.5 g/dL (ref 30.0–36.0)
MCV: 89.3 fL (ref 80.0–100.0)
Platelets: 383 10*3/uL (ref 150–400)
RBC: 2.44 MIL/uL — ABNORMAL LOW (ref 4.22–5.81)
RDW: 13.8 % (ref 11.5–15.5)
WBC: 12.4 10*3/uL — ABNORMAL HIGH (ref 4.0–10.5)
nRBC: 0 % (ref 0.0–0.2)

## 2022-02-18 LAB — LIPID PANEL
Cholesterol: 87 mg/dL (ref 0–200)
HDL: 21 mg/dL — ABNORMAL LOW (ref >40)
LDL Cholesterol: 48 mg/dL (ref 0–99)
Total CHOL/HDL Ratio: 4.1 RATIO
Triglycerides: 88 mg/dL (ref <150)
VLDL: 18 mg/dL (ref 0–40)

## 2022-02-18 MED ORDER — POTASSIUM CHLORIDE CRYS ER 20 MEQ PO TBCR
40.0000 meq | EXTENDED_RELEASE_TABLET | Freq: Once | ORAL | Status: AC
  Administered 2022-02-18: 40 meq via ORAL
  Filled 2022-02-18: qty 2

## 2022-02-18 MED ORDER — ORAL CARE MOUTH RINSE: 15.0000 mL | OROMUCOSAL | Status: DC | PRN

## 2022-02-18 NOTE — Evaluation (Signed)
Physical Therapy Evaluation Patient Details Name: Ryan Hughes MRN: [id number] DOB: May 13, 1965 Today's Date: 02/18/2022  History of Present Illness  Pt is 56 year old male with history of chronic right lower extremity limb threatening ischemia.  Pt admitted on 02/17/2022 for R femoral to peroneal bypass with vein and transmetatarsal amputaion of digits 1 and 2.  Patient does have history of CVA (11/22), DM, HTN,dementia and lives in a group home but is active and walks without much limitation at baseline.  Clinical Impression  Pt admitted with above diagnosis. At baseline, pt is from group home and ambulatory with RW.  Family reports he performs his own ADLs. He does have aphasia with very little verbal communication.  Today, pt very lethargic only followed a few commands for UE movement and lifting trunk to long sit.  R LE painful with movement.  Pt not assisting with any LE movement.  Noted nystagmus R beating and constant - nystagmus present at prior PT evaluation. Additionally, noted pt with soft BP and diaphoretic -HOB was elevated at PT arrival, lowered HOB -no change in BP or pt.  Notified RN pt with soft BP and diaphoretic.  (BP was 99/67). Unable to attempt transfers due to soft BP, lethargy, pt not following commands/assisting, and would need +2.   Pt currently with functional limitations due to the deficits listed below (see PT Problem List). Pt will benefit from skilled PT to increase their independence and safety with mobility to allow discharge to the venue listed below.          Recommendations for follow up therapy are one component of a multi-disciplinary discharge planning process, led by the attending physician.  Recommendations may be updated based on patient status, additional functional criteria and insurance authorization.  Follow Up Recommendations Skilled nursing-short term rehab (<3 hours/day) Can patient physically be transported by private vehicle: No    Assistance  Recommended at Discharge Frequent or constant Supervision/Assistance  Patient can return home with the following  Two people to help with walking and/or transfers;Two people to help with bathing/dressing/bathroom    Equipment Recommendations Other (comment) (defer to post acute)  Recommendations for Other Services       Functional Status Assessment Patient has had a recent decline in their functional status and demonstrates the ability to make significant improvements in function in a reasonable and predictable amount of time.     Precautions / Restrictions Precautions Precautions: Fall Restrictions RLE Weight Bearing:  (heel weightbearing in [facility]) Other Position/Activity Restrictions: right foot only heel weight bearing in Darco offloading shoe      Mobility  Bed Mobility Overal bed mobility: Needs Assistance Bed Mobility: Supine to Sit     Supine to sit: Max assist, HOB elevated     General bed mobility comments: Pt not assisting for transfer to EOB, he did lift trunk from elevated HOB with HHA    Transfers                   General transfer comment: deferred    Ambulation/Gait                  Stairs            Wheelchair Mobility    Modified Rankin (Stroke Patients Only)       Balance Overall balance assessment: Needs assistance Sitting-balance support: Feet supported, Bilateral upper extremity supported Sitting balance-Leahy Scale: Poor Sitting balance - Comments: longsitting with UE support       Standing  balance comment: deferred                             Pertinent Vitals/Pain Pain Assessment Pain Assessment: Faces Faces Pain Scale: Hurts little more Pain Location: with R LE movement Pain Descriptors / Indicators: Grimacing Pain Intervention(s): Limited activity within patient's tolerance, Monitored during session    Home Living Family/patient expects to be discharged to:: Skilled nursing facility                    Additional Comments: Pt is from a group home. He was not able to answer any questions or nod appropriately during session.  Noted family present later - discussed PLOF with family.    Prior Function               Mobility Comments: Family reports pt ambulates at group home with RW ADLs Comments: Family reports pt performs all ADLs at group home.     Hand Dominance   Dominant Hand: Right    Extremity/Trunk Assessment   Upper Extremity Assessment Upper Extremity Assessment: Generalized weakness (ROM appears WFL and strength grossly 3/5 not following further commands)    Lower Extremity Assessment Lower Extremity Assessment: RLE deficits/detail;LLE deficits/detail RLE Deficits / Details: Keeping R knee flexed, tight/painful to straighten due to surgical site LLE Deficits / Details: ROM WFL; Not following any commands for active movements - did hold knee bent when placed    Cervical / Trunk Assessment Cervical / Trunk Assessment: Normal Cervical / Trunk Exceptions: Noted pt with consistent R beating nystagmus (noted present at previous PT evaluation from prior visit)  Communication   Communication: Expressive difficulties (Family reports aphasia and pt with little to no verbal communication normally)  Cognition Arousal/Alertness: Lethargic Behavior During Therapy: Flat affect Overall Cognitive Status: Difficult to assess (Family arrived later and pt asleep) Area of Impairment: Attention, Safety/judgement, Awareness, Orientation, Problem solving, Following commands                   Current Attention Level: Focused   Following Commands:  (Following very few commands (squeezed fingers, lifted trunk with max cues)) Safety/Judgement: Decreased awareness of deficits, Decreased awareness of safety Awareness: Intellectual Problem Solving: Slow processing, Decreased initiation, Difficulty sequencing, Requires verbal cues, Requires tactile cues General  Comments: Pt lethargic, did not communicate with therapist (hx of aphasia), followed very few commands and not nodding yes/no appropriately (or consistently)        General Comments General comments (skin integrity, edema, etc.): Pt lethargic (had received pain meds a couple hours prior).  He followed very few commands and not able to communicate but does have cognitive deficits and aphasia at baseline.  HR and O2 sats stable but did note pt diaphoretic.  BP with HOB elevated 99/67, laid HOB to 20 degrees and BP 100/62.  Noted systolic BP was 0000000 earlier today.  Notified RN - pt diaphoretic and soft BP.    Exercises Other Exercises Other Exercises: Gentler ROM 5 reps R LE with 5 sec holds as able (ankle pump, heel slide, knee ext)   Assessment/Plan    PT Assessment Patient needs continued PT services  PT Problem List Decreased strength;Decreased mobility;Decreased safety awareness;Decreased range of motion;Decreased activity tolerance;Decreased cognition;Cardiopulmonary status limiting activity;Decreased balance;Decreased knowledge of use of DME;Pain;Decreased knowledge of precautions       PT Treatment Interventions DME instruction;Therapeutic activities;Modalities;Gait training;Therapeutic exercise;Patient/family education;Balance training;Functional mobility training    PT Goals (  Current goals can be found in the Care Plan section)  Acute Rehab PT Goals Patient Stated Goal: unable to state PT Goal Formulation: Patient unable to participate in goal setting Time For Goal Achievement: 03/04/22 Potential to Achieve Goals: Fair    Frequency Min 2X/week     Co-evaluation               AM-PAC PT "6 Clicks" Mobility  Outcome Measure Help needed turning from your back to your side while in a flat bed without using bedrails?: Total Help needed moving from lying on your back to sitting on the side of a flat bed without using bedrails?: Total Help needed moving to and from a  bed to a chair (including a wheelchair)?: Total Help needed standing up from a chair using your arms (e.g., wheelchair or bedside chair)?: Total Help needed to walk in hospital room?: Total Help needed climbing 3-5 steps with a railing? : Total 6 Click Score: 6    End of Session   Activity Tolerance: Patient limited by lethargy;Other (comment) (cognition, not follow commands, soft BP/diaphoretic) Patient left: in bed;with call bell/phone within reach;with bed alarm set Nurse Communication: Mobility status;Other (comment) (soft BP/diaphoretic) PT Visit Diagnosis: Other abnormalities of gait and mobility (R26.89);Muscle weakness (generalized) (M62.81)    Time: 5456-2563 PT Time Calculation (min) (ACUTE ONLY): 22 min   Charges:   PT Evaluation $PT Eval Low Complexity: 1 Low          Ethyle Tiedt, PT Acute Rehab [facility] [phone]   Karlton Lemon 02/18/2022, 2:06 PM

## 2022-02-18 NOTE — Progress Notes (Signed)
PHARMACIST LIPID MONITORING   Ryan Hughes is a 56 y.o. male admitted on 02/17/2022 for attempt at R common femoral endarterectomy peroneal artery bypass.  Pharmacy has been consulted to optimize lipid-lowering therapy with the indication of secondary prevention for clinical ASCVD.  Recent Labs:  Lipid Panel (last 6 months):   Lab Results  Component Value Date   CHOL 87 02/18/2022   TRIG 88 02/18/2022   HDL 21 (L) 02/18/2022   CHOLHDL 4.1 02/18/2022   VLDL 18 02/18/2022   LDLCALC 48 02/18/2022    Hepatic function panel (last 6 months):   Lab Results  Component Value Date   AST 11 (L) 02/17/2022   ALT 10 02/17/2022   ALKPHOS 73 02/17/2022   BILITOT 0.5 02/17/2022    SCr (since admission):   Serum creatinine: 1.43 mg/dL (H) 02/18/22 0856 Estimated creatinine clearance: 51.4 mL/min (A)  Current therapy and lipid therapy tolerance Current lipid-lowering therapy: atorvastatin 80mg  Previous lipid-lowering therapies (if applicable): atorvastatin 40mg  Documented or reported allergies or intolerances to lipid-lowering therapies (if applicable): none  Assessment:   LDL at goal on high intensity statin. No changes at this time.   Plan:    1.Statin intensity (high intensity recommended for all patients regardless of the LDL):  No statin changes. The patient is already on a high intensity statin.  2.Add ezetimibe (if any one of the following):   Not indicated at this time.  3.Refer to lipid clinic:   No  4.Follow-up with:  Primary care provider - [name], NP  5.Follow-up labs after discharge:  No changes in lipid therapy, repeat a lipid panel in one year.     Erin Hearing PharmD., BCPS Clinical Pharmacist 02/18/2022 11:03 AM

## 2022-02-18 NOTE — Evaluation (Signed)
Occupational Therapy Evaluation Patient Details Name: Ryan Pauwels MRN: [id number] DOB: November 22, 1965 Today's Date: 02/18/2022   History of Present Illness 56 year old male with history of chronic right lower extremity limb threatening ischemia.  Pt admitted on 02/17/2022 and underwent: "Procedure Performed:  1.  Right common femoral endarterectomy  2.  Harvest right greater saphenous vein  3.  Right peroneal artery endarterectomy  4.  Right common femoral to peroneal artery bypass with ipsilateral, translocated, nonreversed greater saphenous vein  5.  Right lower extremity angiography  6.  Stent of right peroneal artery into the bypass with 2.5 x 48 mm Synergy  7.  transmetatarsal amputation right first and second toes"  Patient does have dementia and lives in a group home but is active and walks without much limitation at baseline.   Clinical Impression   Patient is currently requiring assistance with ADLs including up to Total assist with bed level and EOB Lower body ADLs, up to moderate assist with seated Upper body ADLs,  as well as heavy Max assist with bed mobility and Max assist with functional sit to stand transfer with inability to stand fully upright or pivot to BSC/recliner to toilet. Would require assistance of 2 people for safety.   Current level of function is below patient's typical baseline which is described as independent 2 months ago per chair.  Pt non-verbal during session except for name and DOB with increased effort, so unfortunately pt was unable to express his recent level of function with ADLs.  During this evaluation, patient was limited by post-op pain, generalized weakness, impaired activity tolerance, vision deficits with nystagmus, and cognitive deficits, all of which has the potential to impact patient's safety and independence during functional mobility, as well as performance for ADLs.  Patient demonstrates fair rehab potential, and should benefit from continued skilled  occupational therapy services while in acute care to maximize safety, independence and quality of life at home.  Continued occupational therapy services in a SNF setting prior to return home is recommended.  ?        Recommendations for follow up therapy are one component of a multi-disciplinary discharge planning process, led by the attending physician.  Recommendations may be updated based on patient status, additional functional criteria and insurance authorization.   Follow Up Recommendations  Skilled nursing-short term rehab (<3 hours/day)    Assistance Recommended at Discharge Frequent or constant Supervision/Assistance  Patient can return home with the following Two people to help with walking and/or transfers;A lot of help with walking and/or transfers;A lot of help with bathing/dressing/bathroom;Two people to help with bathing/dressing/bathroom    Functional Status Assessment  Patient has had a recent decline in their functional status and demonstrates the ability to make significant improvements in function in a reasonable and predictable amount of time.  Equipment Recommendations  Other (comment) (Will defer to post-acute recommendations.)    Recommendations for Other Services       Precautions / Restrictions Precautions Precautions: Fall Restrictions Weight Bearing Restrictions: Yes Other Position/Activity Restrictions: right foot only heel weight bearing in post op shoe.      Mobility Bed Mobility Overal bed mobility: Needs Assistance Bed Mobility: Supine to Sit     Supine to sit: Max assist, HOB elevated     General bed mobility comments: Cues for sequencing.    Transfers                          Balance Overall balance  assessment: Needs assistance Sitting-balance support: Feet supported, Bilateral upper extremity supported Sitting balance-Leahy Scale: Poor   Postural control: Right lateral lean, Left lateral lean Standing balance support:  Reliant on assistive device for balance, Bilateral upper extremity supported Standing balance-Leahy Scale: Poor                             ADL either performed or assessed with clinical judgement   ADL Overall ADL's : Needs assistance/impaired (Increased time for all ADLs due to delayed initiation) Eating/Feeding: Bed level;Set up;Supervision/ safety;Cueing for sequencing Eating/Feeding Details (indicate cue type and reason): Pt required assistance to locate his fork despite verbal cues. Fork placed in RT hand and pt then able to initiate self-feeding. Bed placed in chair position. Grooming: Wash/dry hands;Set up;Supervision/safety;Cueing for sequencing;Bed level Grooming Details (indicate cue type and reason): Increased time to process cues to initiate. Pt then washed hands at bed level. Upper Body Bathing: Moderate assistance;Bed level;Cueing for sequencing   Lower Body Bathing: Maximal assistance;Bed level;Cueing for sequencing;Cueing for safety   Upper Body Dressing : Moderate assistance;Cueing for sequencing;Cueing for safety   Lower Body Dressing: Total assistance;Bed level;Sitting/lateral leans Lower Body Dressing Details (indicate cue type and reason): To don LT sock at bed level and RT Darco Shoe at EOB. Toilet Transfer: Rolling walker (2 wheels);Maximal assistance Toilet Transfer Details (indicate cue type and reason): Attempted stand from elevated EOB to RW, but pt unable to come fully upright despite multimodal cues. Pt stood ~30 seconds with apparent attempts to stand fully, but then initiate sit back to EOB. Atetmpted lateral scoot to drop arm recliner, however pt unable to contribute to scoot despite cues and was dead lift. Pt returned to supine in order to avoid injury. Toileting- Clothing Manipulation and Hygiene: Total assistance;Bed level       Functional mobility during ADLs: Maximal assistance;Moderate assistance;Total assistance;Rolling walker (2 wheels);+2  for physical assistance       Vision Ability to See in Adequate Light: 2 Moderately impaired Additional Comments: Frequent nystagmus to both eyes. Difficulty locating utensils in tray.     Perception     Praxis      Pertinent Vitals/Pain Pain Assessment Pain Assessment: PAINAD Breathing: normal Negative Vocalization: occasional moan/groan, low speech, negative/disapproving quality Facial Expression: smiling or inexpressive Body Language: tense, distressed pacing, fidgeting Consolability: no need to console PAINAD Score: 2 Pain Intervention(s): Limited activity within patient's tolerance, Monitored during session, Premedicated before session, Repositioned     Hand Dominance Right   Extremity/Trunk Assessment Upper Extremity Assessment Upper Extremity Assessment: Generalized weakness   Lower Extremity Assessment Lower Extremity Assessment: Defer to PT evaluation       Communication Communication Communication: Expressive difficulties;Other (comment) (Per chart, pt with h/o dementia as well as CVA with expressive aphasia)   Cognition Arousal/Alertness: Awake/alert Behavior During Therapy: Flat affect Overall Cognitive Status: History of cognitive impairments - at baseline Area of Impairment: Attention, Safety/judgement, Awareness, Orientation, Problem solving, Following commands                 Orientation Level:  (Not answering other than name) Current Attention Level: Sustained   Following Commands: Follows one step commands inconsistently, Follows one step commands with increased time Safety/Judgement: Decreased awareness of deficits, Decreased awareness of safety Awareness: Intellectual Problem Solving: Slow processing, Decreased initiation, Difficulty sequencing, Requires verbal cues       General Comments       Exercises  Shoulder Instructions      Home Living Family/patient expects to be discharged to:: Skilled nursing facility                                  Additional Comments: Pt giving no verbal answers except to state name and DOB with loud sigh. Inconsistent head nods/shakes.      Prior Functioning/Environment               Mobility Comments: 2 months ago: Mod I with SPC; household ambulator. Per chart, pt has been primarily wheelchair mobility with occasional limited steps. ADLs Comments: Unknown since rehab.  Independent 2 months ago.        OT Problem List: Decreased strength;Pain;Decreased cognition;Decreased activity tolerance;Decreased safety awareness;Impaired balance (sitting and/or standing);Decreased knowledge of use of DME or AE;Impaired vision/perception;Decreased knowledge of precautions      OT Treatment/Interventions: Self-care/ADL training;Therapeutic exercise;Therapeutic activities;Cognitive remediation/compensation;Neuromuscular education;Visual/perceptual remediation/compensation;DME and/or AE instruction;Patient/family education;Balance training    OT Goals(Current goals can be found in the care plan section) Acute Rehab OT Goals OT Goal Formulation: Patient unable to participate in goal setting Potential to Achieve Goals: Fair ADL Goals Pt Will Perform Grooming: with set-up;with supervision (at EOB with at least fair sitting balance.) Pt Will Perform Upper Body Bathing: with set-up;with supervision;sitting Pt Will Perform Lower Body Bathing: with min assist;sitting/lateral leans;sit to/from stand Pt Will Transfer to Toilet: with min guard assist;stand pivot transfer Pt/caregiver will Perform Home Exercise Program: Both right and left upper extremity;Increased strength;Increased ROM;With Supervision;With minimal assist Additional ADL Goal #1: Pt will demosntrate cognition closer to baseline by following 75% of 1-step and 50% of 2-step simple commands in order to assist caregivers with ADLs and mobility.  OT Frequency: Min 2X/week    Co-evaluation              AM-PAC OT  "6 Clicks" Daily Activity     Outcome Measure Help from another person eating meals?: A Little Help from another person taking care of personal grooming?: A Little Help from another person toileting, which includes using toliet, bedpan, or urinal?: Total Help from another person bathing (including washing, rinsing, drying)?: A Lot Help from another person to put on and taking off regular upper body clothing?: A Lot Help from another person to put on and taking off regular lower body clothing?: Total 6 Click Score: 12   End of Session Equipment Utilized During Treatment: Gait belt;Rolling walker (2 wheels) Nurse Communication: Mobility status;Other (comment) (Pt's cognitive status. RN expressed decreased ability to follow instructions may be due to medication.)  Activity Tolerance: Patient tolerated treatment well Patient left: in bed;with call bell/phone within reach;with bed alarm set  OT Visit Diagnosis: Unsteadiness on feet (R26.81);Low vision, both eyes (H54.2);Cognitive communication deficit (R41.841);Muscle weakness (generalized) (M62.81);Pain;Other symptoms and signs involving cognitive function Symptoms and signs involving cognitive functions: Cerebral infarction Pain - Right/Left: Right Pain - part of body: Ankle and joints of foot;Leg                Time: 1000-1032 OT Time Calculation (min): 32 min Charges:  OT General Charges $OT Visit: 1 Visit OT Evaluation $OT Eval Low Complexity: 1 Low OT Treatments $Therapeutic Activity: 8-22 mins  Anderson Malta, OT Acute Rehab Services Office: [phone] 02/18/2022  Julien Girt 02/18/2022, 10:59 AM

## 2022-02-18 NOTE — Progress Notes (Signed)
Orthopedic Tech Progress Note Patient Details:  Ryan Hughes 03-10-1966 [id number]  Ortho Devices Type of Ortho Device: Darco shoe Ortho Device/Splint Interventions: Ordered, Adjustment  Left darco shoe at bedside. Notified nurse.    Christy Friede [name] 02/18/2022, 8:11 AM

## 2022-02-18 NOTE — Progress Notes (Addendum)
     Progress Note    02/18/2022 7:35 AM 1 Day Post-Op  Subjective:  No complaints this morning   Vitals:   02/18/22 0500 02/18/22 0600  BP: (!) 141/69 132/65  Pulse: 87 89  Resp:    Temp:    SpO2: 100% 100%   Physical Exam: Lungs:  non labored Incisions:  Incisions of R leg c/d/i Extremities:  multiphasic R peroneal, monophasic PT Neurologic: A&O  CBC    Component Value Date/Time   WBC 9.2 02/17/2022 0552   RBC 3.24 (L) 02/17/2022 0552   HGB 9.7 (L) 02/17/2022 0552   HGB 15.3 12/08/2021 1102   HCT 29.9 (L) 02/17/2022 0552   HCT 46.8 12/08/2021 1102   PLT 452 (H) 02/17/2022 0552   PLT 307 12/08/2021 1102   MCV 92.3 02/17/2022 0552   MCV 91 12/08/2021 1102   MCH 29.9 02/17/2022 0552   MCHC 32.4 02/17/2022 0552   RDW 13.6 02/17/2022 0552   RDW 13.4 12/08/2021 1102   LYMPHSABS 3.3 01/31/2022 1459   LYMPHSABS 1.9 12/08/2021 1102   MONOABS 0.5 01/31/2022 1459   EOSABS 0.1 01/31/2022 1459   EOSABS 0.0 12/08/2021 1102   BASOSABS 0.0 01/31/2022 1459   BASOSABS 0.0 12/08/2021 1102    BMET    Component Value Date/Time   NA 143 02/17/2022 0552   NA 138 12/08/2021 1102   K 3.3 (L) 02/17/2022 0552   CL 106 02/17/2022 0552   CO2 22 02/17/2022 0552   GLUCOSE 116 (H) 02/17/2022 0552   BUN 17 02/17/2022 0552   BUN 14 12/08/2021 1102   CREATININE 1.06 02/17/2022 0552   CALCIUM 9.8 02/17/2022 0552   GFRNONAA >60 02/17/2022 0552   GFRAA >60 03/17/2019 0839    INR    Component Value Date/Time   INR 1.0 02/17/2022 0552     Intake/Output Summary (Last 24 hours) at 02/18/2022 0735 Last data filed at 02/18/2022 0600 Gross per 24 hour  Intake 3224.42 ml  Output 1560 ml  Net 1664.42 ml     Assessment/Plan:  56 y.o. male is s/p R femoral to peroneal bypass with vein with toe amps 1 Day Post-Op   R foot well perfused with multiphasic peroneal signal by doppler Continue R foot dressing today OOB with therapy; darco shoe for heel weightbearing only Home  when mobility improved and pain controlled   Dagoberto Ligas, PA-C [facility] [phone] 02/18/2022 7:35 AM  I have independently interviewed and examined patient and agree with PA assessment and plan above. Strong signal right peroneal and PT onto the foot. Toe amputation sites healing well. Heal weight bearing as tolerated.  Giordano Getman C. Donzetta Matters, MD [facility] [place] Office: [phone] Pager: [phone]

## 2022-02-19 LAB — GLUCOSE, CAPILLARY
Glucose-Capillary: 193 mg/dL — ABNORMAL HIGH (ref 70–99)
Glucose-Capillary: 155 mg/dL — ABNORMAL HIGH (ref 70–99)
Glucose-Capillary: 112 mg/dL — ABNORMAL HIGH (ref 70–99)
Glucose-Capillary: 146 mg/dL — ABNORMAL HIGH (ref 70–99)

## 2022-02-19 NOTE — TOC Initial Note (Signed)
Transition of Care [facility]) - Initial/Assessment Note    Patient Details  Name: Ryan Hughes MRN: [id number] Date of Birth: 24-Jun-1965  [facility] CM/SW Contact:    Coralee Pesa, [place] Phone Number: 02/19/2022, 11:19 AM  Clinical Narrative:                 CSW noted pt is minimally verbal and limited in ability to participate in assessment. CSW spoke with Niece Ryan Hughes who confirms pt is from [place] for [place]. Pt was placed there about a week ago, the plan is for him to return. Niece was advised that PT has been recommended, but pt does not have a payor source. Family notes they are unable to pay out of pocket for these services. Family notes no concerns at this time. TOC will continue to follow for DC needs.  Expected Discharge Plan: Skilled Nursing Facility Barriers to Discharge: Continued Medical Work up   Patient Goals and CMS Choice Patient states their goals for this hospitalization and ongoing recovery are:: Pt limited verbally, unable to participate in goal setting. CMS Medicare.gov Compare Post Acute Care list provided to:: Patient Represenative (must comment) (Niece)    Expected Discharge Plan and Services Expected Discharge Plan: [place] Acute Care Choice: [place] Living arrangements for the past 2 months: [place]                                      Prior Living Arrangements/Services Living arrangements for the past 2 months: [place] Lives with:: Facility Resident Patient language and need for interpreter reviewed:: Yes Do you feel safe going back to the place where you live?: Yes      Need for Family Participation in Patient Care: Yes (Comment) Care giver support system in place?: Yes (comment)   Criminal Activity/Legal Involvement Pertinent to Current Situation/Hospitalization: No - Comment as needed  Activities of Daily Living Home Assistive Devices/Equipment:  Wheelchair, Environmental consultant (specify type) ADL Screening (condition at time of admission) Patient's cognitive ability adequate to safely complete daily activities?: No Is the patient deaf or have difficulty hearing?: No Does the patient have difficulty seeing, even when wearing glasses/contacts?: No Does the patient have difficulty concentrating, remembering, or making decisions?: Yes Patient able to express need for assistance with ADLs?: Yes Does the patient have difficulty dressing or bathing?: Yes Independently performs ADLs?: No Communication: Independent Dressing (OT): Needs assistance Is this a change from baseline?: Pre-admission baseline Grooming: Needs assistance Is this a change from baseline?: Pre-admission baseline Feeding: Independent Bathing: Needs assistance Is this a change from baseline?: Pre-admission baseline Toileting: Needs assistance Is this a change from baseline?: Pre-admission baseline In/Out Bed: Needs assistance Is this a change from baseline?: Pre-admission baseline Walks in Home: Needs assistance, Independent with device (comment) Does the patient have difficulty walking or climbing stairs?: Yes Weakness of Legs: Both Weakness of Arms/Hands: None  Permission Sought/Granted Permission sought to share information with : Family Supports Permission granted to share information with : Yes, Verbal Permission Granted  Share Information with NAME: Ryan Hughes     Permission granted to share info w Relationship: Nurse     Emotional Assessment Appearance:: Appears stated age Attitude/Demeanor/Rapport: Unable to Assess Affect (typically observed): Unable to Assess Orientation: : Oriented to Self, Oriented to Place, Oriented to  Time Alcohol / Substance Use: Not Applicable Psych Involvement:  No (comment)  Admission diagnosis:  PAD (peripheral artery disease) ([place]) [I73.9] Patient Active Problem List   Diagnosis Date Noted   PAD (peripheral artery disease) ([place])  02/17/2022   Hypomagnesemia 02/01/2022   Anemia of chronic disease 02/01/2022   Gangrene ([place]) 01/31/2022   Hyperglycemic hyperosmolar nonketotic coma ([place]) 12/17/2021   Hypokalemia 12/16/2021   Type 2 diabetes mellitus with hyperglycemia ([place]) 12/15/2021   Nausea & vomiting 12/15/2021   Acute CVA (cerebrovascular accident) ([place]) 03/27/2021   Type 2 diabetes mellitus with vascular disease ([place]) 03/27/2021   Intractable nausea and vomiting 08/05/2020   HTN (hypertension) 08/05/2020   Acute arterial ischemic stroke, vertebrobasilar, thalamic, left ([place]) 04/25/2020   Mixed hyperlipidemia due to type 2 diabetes mellitus ([place]) 04/25/2020   Benign prostatic hyperplasia (BPH) with straining on urination 04/25/2020   Nicotine dependence, cigarettes, uncomplicated [id number]   Stroke ([place]) 04/25/2020   Rash 03/19/2013   Urinary hesitancy 03/19/2013   Back spasm 05/16/2012   Foot pain 07/18/2011   Diabetes mellitus ([place]) 06/18/2011   Tingling in extremities 06/18/2011   PCP:  [name], NP Pharmacy:   [facility], [place] [address] Phone: [phone] Fax: [phone]  [place], [place] - [address] [place] [address] Phone: [phone] Fax: [phone]     Social Determinants of Health (SDOH) Interventions    Readmission Risk Interventions     No data to display

## 2022-02-19 NOTE — NC FL2 (Signed)
[place] MEDICAID FL2 LEVEL OF CARE SCREENING TOOL     IDENTIFICATION  Patient Name: Ryan Hughes Birthdate: 24-Aug-1965 Sex: male Admission Date (Current Location): 02/17/2022  [facility] and [place] Number:  Herbalist and Address:  [facility] [place] [address] [place], [place] [address]      Provider Number: [id number]  Attending Physician Name and Address:  Thomes Lolling*  Relative Name and Phone Number:       Current Level of Care: Hospital Recommended Level of Care: [place] Prior Approval Number:    Date Approved/Denied:   PASRR Number: [id number]  Discharge Plan: SNF    Current Diagnoses: Patient Active Problem List   Diagnosis Date Noted   PAD (peripheral artery disease) ([place]) 02/17/2022   Hypomagnesemia 02/01/2022   Anemia of chronic disease 02/01/2022   Gangrene ([place]) 01/31/2022   Hyperglycemic hyperosmolar nonketotic coma ([place]) 12/17/2021   Hypokalemia 12/16/2021   Type 2 diabetes mellitus with hyperglycemia ([place]) 12/15/2021   Nausea & vomiting 12/15/2021   Acute CVA (cerebrovascular accident) ([place]) 03/27/2021   Type 2 diabetes mellitus with vascular disease ([place]) 03/27/2021   Intractable nausea and vomiting 08/05/2020   HTN (hypertension) 08/05/2020   Acute arterial ischemic stroke, vertebrobasilar, thalamic, left ([place]) 04/25/2020   Mixed hyperlipidemia due to type 2 diabetes mellitus ([place]) 04/25/2020   Benign prostatic hyperplasia (BPH) with straining on urination 04/25/2020   Nicotine dependence, cigarettes, uncomplicated 95/63/8756   Stroke ([place]) 04/25/2020   Rash 03/19/2013   Urinary hesitancy 03/19/2013   Back spasm 05/16/2012   Foot pain 07/18/2011   Diabetes mellitus ([place]) 06/18/2011   Tingling in extremities 06/18/2011    Orientation RESPIRATION BLADDER Height & Weight     Self, Place, Time  Normal Continent, Indwelling catheter Weight: 170 lb 6.7 oz (77.3 kg) Height:  5\' 2"   (157.5 cm)  BEHAVIORAL SYMPTOMS/MOOD NEUROLOGICAL BOWEL NUTRITION STATUS      Incontinent Diet (See DC summary)  AMBULATORY STATUS COMMUNICATION OF NEEDS Skin   Extensive Assist Verbally Surgical wounds (R leg and foot incisions)                       Personal Care Assistance Level of Assistance  Bathing, Feeding, Dressing Bathing Assistance: Limited assistance Feeding assistance: Independent Dressing Assistance: Limited assistance     Functional Limitations Info  Sight, Hearing, Speech Sight Info: Adequate Hearing Info: Adequate Speech Info: Adequate    SPECIAL CARE FACTORS FREQUENCY  PT (By licensed PT), OT (By licensed OT)     PT Frequency: 5x week OT Frequency: 5x week            Contractures Contractures Info: Not present    Additional Factors Info  Code Status, Allergies Code Status Info: Full Allergies Info: Por derived products, Shellfish derived products           Current Medications (02/19/2022):  This is the current hospital active medication list Current Facility-Administered Medications  Medication Dose Route Frequency Provider Last Rate Last Admin   0.9 %  sodium chloride infusion  500 mL Intravenous Once PRN Schuh, McKenzi P, PA-C       acetaminophen (TYLENOL) tablet 325-650 mg  325-650 mg Oral Q4H PRN Schuh, McKenzi P, PA-C       Or   acetaminophen (TYLENOL) suppository 325-650 mg  325-650 mg Rectal Q4H PRN Schuh, McKenzi P, PA-C       alum & mag hydroxide-simeth (MAALOX/MYLANTA) 200-200-20 MG/5ML suspension 15-30  mL  15-30 mL Oral Q2H PRN Schuh, McKenzi P, PA-C       aspirin tablet 325 mg  325 mg Oral Daily Schuh, McKenzi P, PA-C   325 mg at 02/19/22 0840   atorvastatin (LIPITOR) tablet 80 mg  80 mg Oral QHS Schuh, McKenzi P, PA-C   80 mg at 02/18/22 2126   bisacodyl (DULCOLAX) suppository 10 mg  10 mg Rectal Daily PRN Schuh, McKenzi P, PA-C       carvedilol (COREG) tablet 12.5 mg  12.5 mg Oral BID WC Schuh, McKenzi P, PA-C   12.5 mg at  02/19/22 0840   clopidogrel (PLAVIX) tablet 75 mg  75 mg Oral Q0600 Schuh, McKenzi P, PA-C   75 mg at 02/19/22 0514   docusate sodium (COLACE) capsule 100 mg  100 mg Oral Daily Schuh, McKenzi P, PA-C   100 mg at 02/19/22 0840   [START ON 02/24/2022] Dulaglutide (Trulicity) 0.75 mg SQ injection  0.75 mg Subcutaneous Weekly Schuh, McKenzi P, PA-C       dutasteride (AVODART) capsule 0.5 mg  0.5 mg Oral Daily Schuh, McKenzi P, PA-C   0.5 mg at 02/19/22 0840   guaiFENesin-dextromethorphan (ROBITUSSIN DM) 100-10 MG/5ML syrup 15 mL  15 mL Oral Q4H PRN Schuh, McKenzi P, PA-C       heparin injection 5,000 Units  5,000 Units Subcutaneous Q8H Schuh, McKenzi P, PA-C   5,000 Units at 02/19/22 0513   hydrALAZINE (APRESOLINE) injection 5 mg  5 mg Intravenous Q20 Min PRN Schuh, McKenzi P, PA-C       insulin aspart (novoLOG) injection 0-15 Units  0-15 Units Subcutaneous TID WC Schuh, McKenzi P, PA-C   3 Units at 02/19/22 0940   labetalol (NORMODYNE) injection 10 mg  10 mg Intravenous Q10 min PRN Schuh, McKenzi P, PA-C       losartan (COZAAR) tablet 50 mg  50 mg Oral Daily Schuh, McKenzi P, PA-C   50 mg at 02/19/22 0840   magnesium sulfate IVPB 2 g 50 mL  2 g Intravenous Daily PRN Schuh, McKenzi P, PA-C       metFORMIN (GLUCOPHAGE) tablet 1,000 mg  1,000 mg Oral BID WC Schuh, McKenzi P, PA-C   1,000 mg at 02/19/22 0840   metoprolol tartrate (LOPRESSOR) injection 2-5 mg  2-5 mg Intravenous Q2H PRN Schuh, McKenzi P, PA-C       morphine (PF) 2 MG/ML injection 2-5 mg  2-5 mg Intravenous Q1H PRN Schuh, McKenzi P, PA-C   2 mg at 02/17/22 2315   ondansetron (ZOFRAN) injection 4 mg  4 mg Intravenous Q6H PRN Schuh, McKenzi P, PA-C       Oral care mouth rinse  15 mL Mouth Rinse PRN Maeola Harman, MD       oxyCODONE-acetaminophen (PERCOCET/ROXICET) 5-325 MG per tablet 1-2 tablet  1-2 tablet Oral Q4H PRN [place], McKenzi P, PA-C   2 tablet at 02/18/22 2125   pantoprazole (PROTONIX) EC tablet 40 mg  40 mg Oral Daily  Schuh, McKenzi P, PA-C   40 mg at 02/19/22 0840   phenol (CHLORASEPTIC) mouth spray 1 spray  1 spray Mouth/Throat PRN Schuh, McKenzi P, PA-C       polyethylene glycol (MIRALAX / GLYCOLAX) packet 17 g  17 g Oral Daily PRN Schuh, McKenzi P, PA-C       potassium chloride SA (KLOR-CON M) CR tablet 20-40 mEq  20-40 mEq Oral Daily PRN Schuh, McKenzi P, PA-C       tamsulosin (FLOMAX)  capsule 0.8 mg  0.8 mg Oral Daily Schuh, McKenzi P, PA-C   0.8 mg at 02/19/22 0840   thiamine (VITAMIN B1) tablet 100 mg  100 mg Oral Daily Schuh, McKenzi P, PA-C   100 mg at 02/19/22 0840     Discharge Medications: Please see discharge summary for a list of discharge medications.  Relevant Imaging Results:  Relevant Lab Results:   Additional Information SSN # [id number]  Carley Hammed, [address]

## 2022-02-19 NOTE — Progress Notes (Addendum)
  Progress Note    02/19/2022 8:14 AM 2 Days Post-Op  Subjective:  no complaints   Vitals:   02/19/22 0408 02/19/22 0813  BP: 123/66 107/63  Pulse: 97 95  Resp: 18 16  Temp: 98.4 F (36.9 C) 98.9 F (37.2 C)  SpO2: 100% 100%   Physical Exam: Lungs:  non labored Incisions:  R groin and all other incisions of RLE healing well; dressing changed R foot, amp site with viable skin edges, no drainage Extremities:  brisk multiphasic peroneal signal by doppler RLE Neurologic: A&O  CBC    Component Value Date/Time   WBC 12.4 (H) 02/18/2022 0856   RBC 2.44 (L) 02/18/2022 0856   HGB 7.3 (L) 02/18/2022 0856   HGB 15.3 12/08/2021 1102   HCT 21.8 (L) 02/18/2022 0856   HCT 46.8 12/08/2021 1102   PLT 383 02/18/2022 0856   PLT 307 12/08/2021 1102   MCV 89.3 02/18/2022 0856   MCV 91 12/08/2021 1102   MCH 29.9 02/18/2022 0856   MCHC 33.5 02/18/2022 0856   RDW 13.8 02/18/2022 0856   RDW 13.4 12/08/2021 1102   LYMPHSABS 3.3 01/31/2022 1459   LYMPHSABS 1.9 12/08/2021 1102   MONOABS 0.5 01/31/2022 1459   EOSABS 0.1 01/31/2022 1459   EOSABS 0.0 12/08/2021 1102   BASOSABS 0.0 01/31/2022 1459   BASOSABS 0.0 12/08/2021 1102    BMET    Component Value Date/Time   NA 141 02/18/2022 0856   NA 138 12/08/2021 1102   K 3.3 (L) 02/18/2022 0856   CL 110 02/18/2022 0856   CO2 22 02/18/2022 0856   GLUCOSE 121 (H) 02/18/2022 0856   BUN 24 (H) 02/18/2022 0856   BUN 14 12/08/2021 1102   CREATININE 1.43 (H) 02/18/2022 0856   CALCIUM 8.3 (L) 02/18/2022 0856   GFRNONAA 57 (L) 02/18/2022 0856   GFRAA >60 03/17/2019 0839    INR    Component Value Date/Time   INR 1.0 02/17/2022 0552     Intake/Output Summary (Last 24 hours) at 02/19/2022 0814 Last data filed at 02/19/2022 0500 Gross per 24 hour  Intake 480 ml  Output 650 ml  Net -170 ml     Assessment/Plan:  56 y.o. male is s/p R femoral to peroneal with vein and toe amps 2 Days Post-Op   R foot well perfused based on doppler  exam Dressing changed R foot, amp site with viable skin edges, continue daily dry dressing changes OOB with therapy TOC consulted for SNF placement Ready for d/c when placement approved   Dagoberto Ligas, PA-C [facility] [phone] 02/19/2022 8:14 AM   I have seen and evaluated the patient. I agree with the PA note as documented above.  Right peroneal signal brisk by Doppler.  Dressing change to right foot and toe amputations are viable.  Aspirin Plavix for peroneal artery stent in setting of right peroneal bypass as well.  Placement given group home.  Marty Heck, MD [facility] [place] Office: [phone]

## 2022-02-20 LAB — CBC
HCT: 18.2 % — ABNORMAL LOW (ref 39.0–52.0)
Hemoglobin: 6 g/dL — CL (ref 13.0–17.0)
MCH: 29.7 pg (ref 26.0–34.0)
MCHC: 33 g/dL (ref 30.0–36.0)
MCV: 90.1 fL (ref 80.0–100.0)
Platelets: 363 10*3/uL (ref 150–400)
RBC: 2.02 MIL/uL — ABNORMAL LOW (ref 4.22–5.81)
RDW: 14.1 % (ref 11.5–15.5)
WBC: 17.5 10*3/uL — ABNORMAL HIGH (ref 4.0–10.5)
nRBC: 0 % (ref 0.0–0.2)

## 2022-02-20 LAB — BASIC METABOLIC PANEL
Anion gap: 9 (ref 5–15)
BUN: 30 mg/dL — ABNORMAL HIGH (ref 6–20)
CO2: 21 mmol/L — ABNORMAL LOW (ref 22–32)
Calcium: 8.7 mg/dL — ABNORMAL LOW (ref 8.9–10.3)
Chloride: 112 mmol/L — ABNORMAL HIGH (ref 98–111)
Creatinine, Ser: 1.31 mg/dL — ABNORMAL HIGH (ref 0.61–1.24)
GFR, Estimated: >60 mL/min (ref >60)
Glucose, Bld: 138 mg/dL — ABNORMAL HIGH (ref 70–99)
Potassium: 3.6 mmol/L (ref 3.5–5.1)
Sodium: 142 mmol/L (ref 135–145)

## 2022-02-20 LAB — GLUCOSE, CAPILLARY
Glucose-Capillary: 156 mg/dL — ABNORMAL HIGH (ref 70–99)
Glucose-Capillary: 131 mg/dL — ABNORMAL HIGH (ref 70–99)
Glucose-Capillary: 115 mg/dL — ABNORMAL HIGH (ref 70–99)
Glucose-Capillary: 112 mg/dL — ABNORMAL HIGH (ref 70–99)

## 2022-02-20 LAB — HEMOGLOBIN AND HEMATOCRIT, BLOOD
HCT: 21.1 % — ABNORMAL LOW (ref 39.0–52.0)
Hemoglobin: 7.2 g/dL — ABNORMAL LOW (ref 13.0–17.0)

## 2022-02-20 LAB — PREPARE RBC (CROSSMATCH)

## 2022-02-20 MED ORDER — SODIUM CHLORIDE 0.9% IV SOLUTION: Freq: Once | INTRAVENOUS | Status: AC

## 2022-02-20 NOTE — Progress Notes (Addendum)
  Progress Note    02/20/2022 9:07 AM 3 Days Post-Op  Subjective:  not participating during visit today   Vitals:   02/20/22 0618 02/20/22 0800  BP: 121/66 114/71  Pulse:  87  Resp: 18 18  Temp: 99 F (37.2 C) 98.7 F (37.1 C)  SpO2: 98% 98%   Physical Exam: Lungs:  non labored Incisions:  R groin and incisions of R leg c/d/I; amp site healing well pictured below Extremities:  brisk multiphasic peroneal signal by doppler Neurologic: A&O     CBC    Component Value Date/Time   WBC 17.5 (H) 02/20/2022 0132   RBC 2.02 (L) 02/20/2022 0132   HGB 6.0 (LL) 02/20/2022 0132   HGB 15.3 12/08/2021 1102   HCT 18.2 (L) 02/20/2022 0132   HCT 46.8 12/08/2021 1102   PLT 363 02/20/2022 0132   PLT 307 12/08/2021 1102   MCV 90.1 02/20/2022 0132   MCV 91 12/08/2021 1102   MCH 29.7 02/20/2022 0132   MCHC 33.0 02/20/2022 0132   RDW 14.1 02/20/2022 0132   RDW 13.4 12/08/2021 1102   LYMPHSABS 3.3 01/31/2022 1459   LYMPHSABS 1.9 12/08/2021 1102   MONOABS 0.5 01/31/2022 1459   EOSABS 0.1 01/31/2022 1459   EOSABS 0.0 12/08/2021 1102   BASOSABS 0.0 01/31/2022 1459   BASOSABS 0.0 12/08/2021 1102    BMET    Component Value Date/Time   NA 142 02/20/2022 0132   NA 138 12/08/2021 1102   K 3.6 02/20/2022 0132   CL 112 (H) 02/20/2022 0132   CO2 21 (L) 02/20/2022 0132   GLUCOSE 138 (H) 02/20/2022 0132   BUN 30 (H) 02/20/2022 0132   BUN 14 12/08/2021 1102   CREATININE 1.31 (H) 02/20/2022 0132   CALCIUM 8.7 (L) 02/20/2022 0132   GFRNONAA >60 02/20/2022 0132   GFRAA >60 03/17/2019 0839    INR    Component Value Date/Time   INR 1.0 02/17/2022 0552     Intake/Output Summary (Last 24 hours) at 02/20/2022 2694 Last data filed at 02/20/2022 0612 Gross per 24 hour  Intake 567 ml  Output 800 ml  Net -233 ml     Assessment/Plan:  56 y.o. male is s/p R femoral to peroneal bypass with vein and toe amps 3 Days Post-Op   R foot well perfused with brisk peroneal signal by  doppler R foot amp site healing well; re-dressed with dry dressing OOB with therapy Hgb 7.2; will transfuse 1u pRBC; CBC post transfusion   Dagoberto Ligas, PA-C [facility] [phone] 02/20/2022 9:07 AM  I have seen and evaluated the patient. I agree with the PA note as documented above.  Right peroneal signal remains brisk by Doppler.  Dressings changed to right foot and toe amputation sites look good.  Did transfuse 1 unit packed red blood cells for hemoglobin of 6 this morning.  Have asked nursing to remove his Foley catheter.  Awaiting placement.  Marty Heck, MD [facility] [place] Office: [phone]

## 2022-02-20 NOTE — Progress Notes (Addendum)
Bladder scan performed at [facility]. Pt had 30ml on bladder scan. Patient denies pressure. MD paged.   MD stated to bladder scan patient again at 8 hour mark (2030). If patient is holding greater than 400 ml to I&O. If patient has not made urine by a 12 hour mark (2430) to attempt an I&O. Will report to oncoming RN.  Daymon Larsen, RN

## 2022-02-21 LAB — TYPE AND SCREEN
ABO/RH(D): AB POS
Antibody Screen: NEGATIVE
Unit division: 0

## 2022-02-21 LAB — GLUCOSE, CAPILLARY
Glucose-Capillary: 131 mg/dL — ABNORMAL HIGH (ref 70–99)
Glucose-Capillary: 166 mg/dL — ABNORMAL HIGH (ref 70–99)
Glucose-Capillary: 132 mg/dL — ABNORMAL HIGH (ref 70–99)
Glucose-Capillary: 176 mg/dL — ABNORMAL HIGH (ref 70–99)

## 2022-02-21 LAB — BPAM RBC
Blood Product Expiration Date: 202311102359
ISSUE DATE / TIME: 202310150343
Unit Type and Rh: 6200

## 2022-02-21 NOTE — Progress Notes (Signed)
I&O cath 600 ml urine, catheter noted to have blood residue on it upon removal.

## 2022-02-21 NOTE — Progress Notes (Addendum)
     Progress Note    02/21/2022 7:20 AM 4 Days Post-Op  Subjective:  no complaints   Vitals:   02/21/22 0000 02/21/22 0400  BP: (!) 145/84 (!) 148/76  Pulse: 62 84  Resp: 20 18  Temp: 98.7 F (37.1 C) 98.8 F (37.1 C)  SpO2: 96% 98%   Physical Exam: Cardiac:  regular Lungs:  non labored Incisions:  right groin, right thigh and leg incisions are all intact and well appearing. right foot dressed Extremities:  well perfused and warm with Doppler Pero/PT signals Neurologic: alert  CBC    Component Value Date/Time   WBC 17.5 (H) 02/20/2022 0132   RBC 2.02 (L) 02/20/2022 0132   HGB 7.2 (L) 02/20/2022 0921   HGB 15.3 12/08/2021 1102   HCT 21.1 (L) 02/20/2022 0921   HCT 46.8 12/08/2021 1102   PLT 363 02/20/2022 0132   PLT 307 12/08/2021 1102   MCV 90.1 02/20/2022 0132   MCV 91 12/08/2021 1102   MCH 29.7 02/20/2022 0132   MCHC 33.0 02/20/2022 0132   RDW 14.1 02/20/2022 0132   RDW 13.4 12/08/2021 1102   LYMPHSABS 3.3 01/31/2022 1459   LYMPHSABS 1.9 12/08/2021 1102   MONOABS 0.5 01/31/2022 1459   EOSABS 0.1 01/31/2022 1459   EOSABS 0.0 12/08/2021 1102   BASOSABS 0.0 01/31/2022 1459   BASOSABS 0.0 12/08/2021 1102    BMET    Component Value Date/Time   NA 142 02/20/2022 0132   NA 138 12/08/2021 1102   K 3.6 02/20/2022 0132   CL 112 (H) 02/20/2022 0132   CO2 21 (L) 02/20/2022 0132   GLUCOSE 138 (H) 02/20/2022 0132   BUN 30 (H) 02/20/2022 0132   BUN 14 12/08/2021 1102   CREATININE 1.31 (H) 02/20/2022 0132   CALCIUM 8.7 (L) 02/20/2022 0132   GFRNONAA >60 02/20/2022 0132   GFRAA >60 03/17/2019 0839    INR    Component Value Date/Time   INR 1.0 02/17/2022 0552     Intake/Output Summary (Last 24 hours) at 02/21/2022 0720 Last data filed at 02/21/2022 0054 Gross per 24 hour  Intake 610 ml  Output 635 ml  Net -25 ml     Assessment/Plan:  56 y.o. male is s/p R femoral to peroneal bypass with vein and 1st and 2nd toe amps 4 Days Post-Op   Right  foot remains well perfused and warm with doppler PT/Pero signals Hgb 7.2 post transfusion 1 Unit yesterday. Labs ordered for tomorrow Continue PT/OT Urinary retention. In and out cath x 1 since foley removed. Will monitor today Pending placement / return to [facility]  Karoline Caldwell, [place] [facility] [phone] 02/21/2022 7:20 AM  I have independently interviewed and examined patient and agree with PA assessment and plan above.   Brandon C. Donzetta Matters, MD [facility] [place] Office: [phone] Pager: [phone]

## 2022-02-21 NOTE — Progress Notes (Signed)
No urine output for 12 hours after foley catheter discntinued,bladder scan performed,pt had 380 on bladder scan, In and out catheterization done and obtain 460 urine output.Will continue to monitor.

## 2022-02-22 LAB — CBC
HCT: 20.2 % — ABNORMAL LOW (ref 39.0–52.0)
HCT: 26.8 % — ABNORMAL LOW (ref 39.0–52.0)
Hemoglobin: 6.6 g/dL — CL (ref 13.0–17.0)
Hemoglobin: 9.4 g/dL — ABNORMAL LOW (ref 13.0–17.0)
MCH: 29.2 pg (ref 26.0–34.0)
MCH: 29.9 pg (ref 26.0–34.0)
MCHC: 32.7 g/dL (ref 30.0–36.0)
MCHC: 35.1 g/dL (ref 30.0–36.0)
MCV: 89.4 fL (ref 80.0–100.0)
MCV: 85.4 fL (ref 80.0–100.0)
Platelets: 417 10*3/uL — ABNORMAL HIGH (ref 150–400)
Platelets: 414 10*3/uL — ABNORMAL HIGH (ref 150–400)
RBC: 2.26 MIL/uL — ABNORMAL LOW (ref 4.22–5.81)
RBC: 3.14 MIL/uL — ABNORMAL LOW (ref 4.22–5.81)
RDW: 14.9 % (ref 11.5–15.5)
RDW: 15.4 % (ref 11.5–15.5)
WBC: 15.5 10*3/uL — ABNORMAL HIGH (ref 4.0–10.5)
WBC: 15.5 10*3/uL — ABNORMAL HIGH (ref 4.0–10.5)
nRBC: 0.1 % (ref 0.0–0.2)
nRBC: 0.2 % (ref 0.0–0.2)

## 2022-02-22 LAB — GLUCOSE, CAPILLARY
Glucose-Capillary: 140 mg/dL — ABNORMAL HIGH (ref 70–99)
Glucose-Capillary: 122 mg/dL — ABNORMAL HIGH (ref 70–99)
Glucose-Capillary: 161 mg/dL — ABNORMAL HIGH (ref 70–99)
Glucose-Capillary: 184 mg/dL — ABNORMAL HIGH (ref 70–99)

## 2022-02-22 LAB — BASIC METABOLIC PANEL
Anion gap: 10 (ref 5–15)
BUN: 28 mg/dL — ABNORMAL HIGH (ref 6–20)
CO2: 22 mmol/L (ref 22–32)
Calcium: 8.7 mg/dL — ABNORMAL LOW (ref 8.9–10.3)
Chloride: 110 mmol/L (ref 98–111)
Creatinine, Ser: 1.02 mg/dL (ref 0.61–1.24)
GFR, Estimated: >60 mL/min (ref >60)
Glucose, Bld: 141 mg/dL — ABNORMAL HIGH (ref 70–99)
Potassium: 3.6 mmol/L (ref 3.5–5.1)
Sodium: 142 mmol/L (ref 135–145)

## 2022-02-22 LAB — PREPARE RBC (CROSSMATCH)

## 2022-02-22 MED ORDER — SODIUM CHLORIDE 0.9% IV SOLUTION: Freq: Once | INTRAVENOUS | Status: AC

## 2022-02-22 NOTE — Progress Notes (Signed)
S/w Dr Virl Cagey.  Update given to him on the prior bladder scans and I and O caths that the patient has received.  Informed him that patient had bladder scan at 2100 that was 203 cc.   Order received to place foley cath if bladder scan was 500 cc or greater.

## 2022-02-22 NOTE — Progress Notes (Signed)
Physical Therapy Treatment Patient Details Name: Ryan Hughes MRN: [id number] DOB: [birth year] Today's Date: 02/22/2022   History of Present Illness Pt is 56 year old male with history of chronic right lower extremity limb threatening ischemia.  Pt admitted on 02/17/2022 for R femoral to peroneal bypass with vein and transmetatarsal amputaion of digits 1 and 2.  Patient does have history of CVA (11/22), DM, HTN,dementia and lives in a group home but is active and walks without much limitation at baseline.    PT Comments    Pt received supine and willing to attempt OOB mobility. Pt able to answer yes/no questions with head shakes and thumbs up/down throughout session. Pt with excellent progress this session with ability to come to sitting EOB with min assist and transfer to stand with RW with mod assist with pt able to follow commands throughout session. Encouraged pt to rest with RLE in extension and neutral rotation as tolerated with pt nodding in agreement. Further ambulation attempts deferred this session as pt receiving blood, plan to progress next session as able. Pt continues to benefit from skilled PT services to progress toward functional mobility goals.    Recommendations for follow up therapy are one component of a multi-disciplinary discharge planning process, led by the attending physician.  Recommendations may be updated based on patient status, additional functional criteria and insurance authorization.  Follow Up Recommendations  Skilled nursing-short term rehab (<3 hours/day) Can patient physically be transported by private vehicle: No   Assistance Recommended at Discharge Frequent or constant Supervision/Assistance  Patient can return home with the following Two people to help with walking and/or transfers;Two people to help with bathing/dressing/bathroom   Equipment Recommendations  Other (comment) (defer to post acute)    Recommendations for Other Services        Precautions / Restrictions Precautions Precautions: Fall Restrictions Weight Bearing Restrictions: Yes RLE Weight Bearing: Weight bearing as tolerated Other Position/Activity Restrictions: right foot,  heel weight bearing in Darco offloading shoe     Mobility  Bed Mobility Overal bed mobility: Needs Assistance Bed Mobility: Supine to Sit, Sit to Supine     Supine to sit: HOB elevated, Min assist Sit to supine: Min assist   General bed mobility comments: min asssit to elevate trunk with pt with good initiation assist to bring LLE to and off/on EOB    Transfers Overall transfer level: Needs assistance Equipment used: Rolling walker (2 wheels) Transfers: Sit to/from Stand Sit to Stand: Mod assist           General transfer comment: mod asssit to power up, donned pt tennis shoe and darco shoe    Ambulation/Gait Ambulation/Gait assistance: Mod assist Gait Distance (Feet): 3 Feet Assistive device: Rolling walker (2 wheels) Gait Pattern/deviations: Step-to pattern, Shuffle       General Gait Details: able to take steps toward [place]             Wheelchair Mobility    Modified Rankin (Stroke Patients Only)       Balance Overall balance assessment: Needs assistance Sitting-balance support: Feet supported, Bilateral upper extremity supported Sitting balance-Leahy Scale: Fair Sitting balance - Comments: BUE support needed   Standing balance support: Reliant on assistive device for balance, Bilateral upper extremity supported Standing balance-Leahy Scale: Poor                              Cognition Arousal/Alertness: Awake/alert Behavior During Therapy: Flat affect  Overall Cognitive Status: Difficult to assess                                 General Comments: pt able to follow all commands and respond with head shaking yes/no and thumbs up/down        Exercises      General Comments        Pertinent  Vitals/Pain Pain Assessment Pain Assessment: Faces Faces Pain Scale: Hurts little more Pain Location: with R LE in weight bearing Pain Descriptors / Indicators: Grimacing Pain Intervention(s): Monitored during session, Limited activity within patient's tolerance    Home Living                          Prior Function            PT Goals (current goals can now be found in the care plan section) Acute Rehab PT Goals Patient Stated Goal: unable to state PT Goal Formulation: Patient unable to participate in goal setting Time For Goal Achievement: 03/04/22    Frequency    Min 2X/week      PT Plan      Co-evaluation              AM-PAC PT "6 Clicks" Mobility   Outcome Measure  Help needed turning from your back to your side while in a flat bed without using bedrails?: A Lot Help needed moving from lying on your back to sitting on the side of a flat bed without using bedrails?: A Lot Help needed moving to and from a bed to a chair (including a wheelchair)?: A Lot Help needed standing up from a chair using your arms (e.g., wheelchair or bedside chair)?: A Lot Help needed to walk in hospital room?: A Lot Help needed climbing 3-5 steps with a railing? : Total 6 Click Score: 11    End of Session   Activity Tolerance: Patient tolerated treatment well Patient left: in bed;with call bell/phone within reach;with bed alarm set Nurse Communication: Mobility status PT Visit Diagnosis: Other abnormalities of gait and mobility (R26.89);Muscle weakness (generalized) (M62.81)     Time: [id number] PT Time Calculation (min) (ACUTE ONLY): 18 min  Charges:  $Therapeutic Activity: 8-22 mins                     Roderick Calo R. PTA Acute Rehabilitation Services Office: [place] 02/22/2022, 2:57 PM

## 2022-02-22 NOTE — Progress Notes (Signed)
Bladder scan done - 203cc

## 2022-02-22 NOTE — Progress Notes (Signed)
Bladder scan done   230 cc

## 2022-02-22 NOTE — Progress Notes (Signed)
Patient with 335 on bladder scan, Dr. Donzetta Matters made aware and orders received for foley catheter. Foley catheter inserted as ordered. And 325 ml urine returned. Dru Primeau, Bettina Gavia rN

## 2022-02-22 NOTE — Progress Notes (Signed)
PA aware patient has not voided and bladder scan at 141ml. Will continue to bladder scan as ordered. Nikeisha Klutz, Bettina Gavia RN

## 2022-02-22 NOTE — Progress Notes (Signed)
I and O cath done     400 cc of urine obtained.

## 2022-02-22 NOTE — Progress Notes (Addendum)
  Progress Note    02/22/2022 7:30 AM 5 Days Post-Op  Subjective:  no complaints   Vitals:   02/21/22 2305 02/22/22 0321  BP: (!) 149/77 131/69  Pulse:    Resp:    Temp: 99.1 F (37.3 C) 99 F (37.2 C)  SpO2:     Physical Exam: Cardiac:  regular Lungs:  non labored Incisions:  Right groin, right thigh and leg incisions are all intact and well appearing. 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 Dorsum of foot dark. Plantar flat very tender. No drainage or fluid collections appreciable    Extremities:  right leg is well perfused with Doppler pero/PT signals Neurologic: alert   CBC    Component Value Date/Time   WBC 15.5 (H) 02/22/2022 0103   RBC 2.26 (L) 02/22/2022 0103   HGB 6.6 (LL) 02/22/2022 0103   HGB 15.3 12/08/2021 1102   HCT 20.2 (L) 02/22/2022 0103   HCT 46.8 12/08/2021 1102   PLT 417 (H) 02/22/2022 0103   PLT 307 12/08/2021 1102   MCV 89.4 02/22/2022 0103   MCV 91 12/08/2021 1102   MCH 29.2 02/22/2022 0103   MCHC 32.7 02/22/2022 0103   RDW 14.9 02/22/2022 0103   RDW 13.4 12/08/2021 1102   LYMPHSABS 3.3 01/31/2022 1459   LYMPHSABS 1.9 12/08/2021 1102   MONOABS 0.5 01/31/2022 1459   EOSABS 0.1 01/31/2022 1459   EOSABS 0.0 12/08/2021 1102   BASOSABS 0.0 01/31/2022 1459   BASOSABS 0.0 12/08/2021 1102    BMET    Component Value Date/Time   NA 142 02/22/2022 0103   NA 138 12/08/2021 1102   K 3.6 02/22/2022 0103   CL 110 02/22/2022 0103   CO2 22 02/22/2022 0103   GLUCOSE 141 (H) 02/22/2022 0103   BUN 28 (H) 02/22/2022 0103   BUN 14 12/08/2021 1102   CREATININE 1.02 02/22/2022 0103   CALCIUM 8.7 (L) 02/22/2022 0103   GFRNONAA >60 02/22/2022 0103   GFRAA >60 03/17/2019 0839    INR    Component Value Date/Time   INR 1.0 02/17/2022 0552     Intake/Output Summary (Last 24 hours) at 02/22/2022 0730 Last data filed at 02/22/2022 2595 Gross per 24 hour  Intake 240 ml  Output 1203 ml  Net -963 ml     Assessment/Plan:  56  y.o. male is s/p  R femoral to peroneal bypass with vein and 1st and 2nd toe amps 5 Days Post-Op   Right foot remains well perfused and warm with doppler PT/ Pero signals Hgb 6.6. 2 Units PRBC ordered Continue PT/OT Continues to have urinary retention Appreciate TOC assistance with placement  Karoline Caldwell, PA-C [facility] [phone] 02/22/2022 7:30 AM  I have independently interviewed and examined patient and agree with PA assessment and plan above.  No evidence of ongoing blood loss but will transfuse again today.  Very strong peroneal signal at the ankle to suggest bypass patency and amputation site currently appears viable.  Coretta Leisey C. Donzetta Matters, MD [facility] [place] Office: [phone] Pager: [phone]

## 2022-02-23 LAB — TYPE AND SCREEN
ABO/RH(D): AB POS
Antibody Screen: NEGATIVE
Unit division: 0
Unit division: 0

## 2022-02-23 LAB — BPAM RBC
Blood Product Expiration Date: 202311192359
Blood Product Expiration Date: 202311192359
ISSUE DATE / TIME: 202310170925
ISSUE DATE / TIME: 202310171322
Unit Type and Rh: 8400
Unit Type and Rh: 8400

## 2022-02-23 LAB — GLUCOSE, CAPILLARY
Glucose-Capillary: 197 mg/dL — ABNORMAL HIGH (ref 70–99)
Glucose-Capillary: 253 mg/dL — ABNORMAL HIGH (ref 70–99)
Glucose-Capillary: 153 mg/dL — ABNORMAL HIGH (ref 70–99)
Glucose-Capillary: 128 mg/dL — ABNORMAL HIGH (ref 70–99)

## 2022-02-23 NOTE — Progress Notes (Signed)
Patient refused heparin injection. This RN educated on importance but patient continued to decline.  Ryan Hughes

## 2022-02-23 NOTE — TOC Progression Note (Signed)
Transition of Care [facility]) - Progression Note    Patient Details  Name: Ryan Hughes MRN: [id number] Date of Birth: Sep 21, 1965  Transition of Care [facility]) CM/SW [place], [place] Phone Number: 02/23/2022, 12:24 PM  Clinical Narrative:     TOC continues to follow and assist with discharge planning.   Patient will need authorization before returning to [place].   Thurmond Butts, MSW, LCSW Clinical Social Worker    Expected Discharge Plan: Skilled Nursing Facility Barriers to Discharge: Continued Medical Work up  Expected Discharge Plan and Services Expected Discharge Plan: [place] Choice: [place] arrangements for the past 2 months: [place]                                       Social Determinants of Health (SDOH) Interventions    Readmission Risk Interventions     No data to display

## 2022-02-23 NOTE — Progress Notes (Signed)
OT Cancellation Note  Patient Details Name: [name] MRN: [id number] DOB: [birth year]   Cancelled Treatment:    Reason Eval/Treat Not Completed: Patient declined, no reason specified (Pt alert and awake, however not making eye contact or acknowleding therapist despite max efforts and encouragement. OT to continue efforts as pt allows.)  Elliot Cousin 02/23/2022, 3:37 PM

## 2022-02-23 NOTE — TOC Progression Note (Signed)
Transition of Care [facility]) - Progression Note    Patient Details  Name: Ryan Hughes MRN: [id number] Date of Birth: [birth year]  Transition of Care [facility]) CM/SW [place], [place] Phone Number: 02/23/2022, 4:14 PM  Clinical Narrative:     Spoke with patient's niece,[name]- she is agreeable to patient returning to [place] once medically stable.  Thurmond Butts, MSW, LCSW Clinical Social Worker    Expected Discharge Plan: Skilled Nursing Facility Barriers to Discharge: Continued Medical Work up  Expected Discharge Plan and Services Expected Discharge Plan: [place] Choice: [place] arrangements for the past 2 months: [place]                                       Social Determinants of Health (SDOH) Interventions    Readmission Risk Interventions     No data to display

## 2022-02-23 NOTE — Plan of Care (Signed)
  Problem: Activity: Goal: Ability to return to baseline activity level will improve Outcome: Progressing   Problem: Cardiovascular: Goal: Ability to achieve and maintain adequate cardiovascular perfusion will improve Outcome: Progressing Goal: Vascular access site(s) Level 0-1 will be maintained Outcome: Progressing   Problem: Health Behavior/Discharge Planning: Goal: Ability to safely manage health-related needs after discharge will improve Outcome: Progressing   Problem: Education: Goal: Ability to describe self-care measures that may prevent or decrease complications (Diabetes Survival Skills Education) will improve Outcome: Progressing   Problem: Coping: Goal: Ability to adjust to condition or change in health will improve Outcome: Progressing

## 2022-02-23 NOTE — Progress Notes (Signed)
Mobility Specialist Progress Note   02/23/22 1150  Mobility  Activity Ambulated with assistance in room;Dangled on edge of bed  Level of Assistance Minimal assist, patient does 75% or more  Assistive Device Front wheel walker  Distance Ambulated (ft) 6 ft  Range of Motion/Exercises Active;All extremities  RLE Weight Bearing WBAT  Activity Response Tolerated well   Patient received in supine and agreeable to participate. Presented as alert but unsure of orientation as patient was non-verbal with me, only answered with yes or no head-nods. RN and NT explained that he has been verbal but minimally. Required mod A to EOB from supine to sit + extra time and stood with min A + cues for hand placement. Writer asked patient initially before standing if he used the RW with physical therapy and he shook his head no, upon assessing mobility further, deemed it essential to use AD for safety. Ambulated with short step-to gait, taking sips of soda in between each step. Required seated rest x1 but did not verbalize need, patient instead stopped and did a partial squat. Returned to bed without complaint or incident. Was left in supine with all needs met, call bell in reach.   [place] Amiley Shishido, [place], [place]  Office: [phone]

## 2022-02-23 NOTE — Progress Notes (Addendum)
  Progress Note    02/23/2022 8:20 AM 6 Days Post-Op  Subjective: does not really participate in examination today   Vitals:   02/23/22 0332 02/23/22 0731  BP: (!) 163/80 (!) 175/82  Pulse: 78 74  Resp: 17 19  Temp: 98.4 F (36.9 C) 98.3 F (36.8 C)  SpO2: 100% 99%   Physical Exam: Cardiac:  regular Lungs:  non labored Incisions:  right leg incisions are clean, dry and intact. Right foot dressings c/d/i Extremities:  RLE well perfused and warm with Doppler PT/Pero and AT signals Abdomen:  soft Neurologic: alert  CBC    Component Value Date/Time   WBC 15.5 (H) 02/22/2022 2004   RBC 3.14 (L) 02/22/2022 2004   HGB 9.4 (L) 02/22/2022 2004   HGB 15.3 12/08/2021 1102   HCT 26.8 (L) 02/22/2022 2004   HCT 46.8 12/08/2021 1102   PLT 414 (H) 02/22/2022 2004   PLT 307 12/08/2021 1102   MCV 85.4 02/22/2022 2004   MCV 91 12/08/2021 1102   MCH 29.9 02/22/2022 2004   MCHC 35.1 02/22/2022 2004   RDW 15.4 02/22/2022 2004   RDW 13.4 12/08/2021 1102   LYMPHSABS 3.3 01/31/2022 1459   LYMPHSABS 1.9 12/08/2021 1102   MONOABS 0.5 01/31/2022 1459   EOSABS 0.1 01/31/2022 1459   EOSABS 0.0 12/08/2021 1102   BASOSABS 0.0 01/31/2022 1459   BASOSABS 0.0 12/08/2021 1102    BMET    Component Value Date/Time   NA 142 02/22/2022 0103   NA 138 12/08/2021 1102   K 3.6 02/22/2022 0103   CL 110 02/22/2022 0103   CO2 22 02/22/2022 0103   GLUCOSE 141 (H) 02/22/2022 0103   BUN 28 (H) 02/22/2022 0103   BUN 14 12/08/2021 1102   CREATININE 1.02 02/22/2022 0103   CALCIUM 8.7 (L) 02/22/2022 0103   GFRNONAA >60 02/22/2022 0103   GFRAA >60 03/17/2019 0839    INR    Component Value Date/Time   INR 1.0 02/17/2022 0552     Intake/Output Summary (Last 24 hours) at 02/23/2022 0820 Last data filed at 02/23/2022 0546 Gross per 24 hour  Intake 1044 ml  Output 675 ml  Net 369 ml     Assessment/Plan:  56 y.o. male is s/p R femoral to peroneal bypass with vein and 1st and 2nd toe amps 6  Days Post-Op   Right leg well perfused and warm with doppler PT/ Pero signals Right leg incisions are c/d/i H&H stable post transfusion of 2 units PRBC Foley placed due to urinary retention. Will arrange outpatient urology follow up PT recommending SNF. Continue to mobilize/ OOB to chair Appreciate TOC assistance with SNF placement   Marval Regal [facility] [phone] 02/23/2022 8:20 AM  I have independently interviewed and examined patient and agree with PA assessment and plan above.   Clovis Warwick C. Donzetta Matters, MD [facility] [place] Office: [phone] Pager: [phone]

## 2022-02-24 LAB — GLUCOSE, CAPILLARY
Glucose-Capillary: 142 mg/dL — ABNORMAL HIGH (ref 70–99)
Glucose-Capillary: 188 mg/dL — ABNORMAL HIGH (ref 70–99)
Glucose-Capillary: 161 mg/dL — ABNORMAL HIGH (ref 70–99)
Glucose-Capillary: 114 mg/dL — ABNORMAL HIGH (ref 70–99)

## 2022-02-24 MED ORDER — CHLORHEXIDINE GLUCONATE CLOTH 2 % EX PADS
6.0000 | MEDICATED_PAD | Freq: Every day | CUTANEOUS | Status: DC
  Administered 2022-02-23 – 2022-02-25 (×2): 6 via TOPICAL

## 2022-02-24 NOTE — Progress Notes (Signed)
This nurse noted enlarged, firm area around right groin incision with some purulent drainage at incision. Vascular team notified and came to bedside to assess pt. Gauze placed at incision.  Raelyn Number, RN

## 2022-02-24 NOTE — Progress Notes (Addendum)
  Progress Note    02/24/2022 8:25 AM 7 Days Post-Op  Subjective:  no complaints   Vitals:   02/24/22 0340 02/24/22 0600  BP: (!) 152/61   Pulse: 76 78  Resp: 18   Temp: 98.6 F (37 C)   SpO2: 100% 100%   Physical Exam: Lungs:  non labored Incisions:  RLE incisions healing well; viable skin edges toe amp site; fullness R groin but no dehiscence or drainage Extremities:  brisk R peroneal by doppler Neurologic: alert; unsure of orientation this morning  CBC    Component Value Date/Time   WBC 15.5 (H) 02/22/2022 2004   RBC 3.14 (L) 02/22/2022 2004   HGB 9.4 (L) 02/22/2022 2004   HGB 15.3 12/08/2021 1102   HCT 26.8 (L) 02/22/2022 2004   HCT 46.8 12/08/2021 1102   PLT 414 (H) 02/22/2022 2004   PLT 307 12/08/2021 1102   MCV 85.4 02/22/2022 2004   MCV 91 12/08/2021 1102   MCH 29.9 02/22/2022 2004   MCHC 35.1 02/22/2022 2004   RDW 15.4 02/22/2022 2004   RDW 13.4 12/08/2021 1102   LYMPHSABS 3.3 01/31/2022 1459   LYMPHSABS 1.9 12/08/2021 1102   MONOABS 0.5 01/31/2022 1459   EOSABS 0.1 01/31/2022 1459   EOSABS 0.0 12/08/2021 1102   BASOSABS 0.0 01/31/2022 1459   BASOSABS 0.0 12/08/2021 1102    BMET    Component Value Date/Time   NA 142 02/22/2022 0103   NA 138 12/08/2021 1102   K 3.6 02/22/2022 0103   CL 110 02/22/2022 0103   CO2 22 02/22/2022 0103   GLUCOSE 141 (H) 02/22/2022 0103   BUN 28 (H) 02/22/2022 0103   BUN 14 12/08/2021 1102   CREATININE 1.02 02/22/2022 0103   CALCIUM 8.7 (L) 02/22/2022 0103   GFRNONAA >60 02/22/2022 0103   GFRAA >60 03/17/2019 0839    INR    Component Value Date/Time   INR 1.0 02/17/2022 0552     Intake/Output Summary (Last 24 hours) at 02/24/2022 0825 Last data filed at 02/24/2022 0600 Gross per 24 hour  Intake 730 ml  Output 1100 ml  Net -370 ml     Assessment/Plan:  56 y.o. male is s/p R femoral to peroneal bypass with vein and 1st and 2nd toe amps 7 Days Post-Op   R foot well perfused by doppler exam; RLE  incisions healing well Toes 3 and 5 continue to darken; may require further toe amps/TMA at a later date Follow up with urology as an outpatient due to urinary retention; continue foley Fullness R groin but no dehiscence or drainage; dry dressing changes daily to wick moisture Home when SNF approved   Dagoberto Ligas, PA-C [facility] [phone] 02/24/2022 8:25 AM  I have independently interviewed and examined patient and agree with PA assessment and plan above.  We will allow further demarcation of remaining toes prior to any additional surgery.  Plan will be for SNF upon discharge.  Brandon C. Donzetta Matters, MD [facility] [place] Office: [phone] Pager: [phone]

## 2022-02-25 LAB — GLUCOSE, CAPILLARY
Glucose-Capillary: 149 mg/dL — ABNORMAL HIGH (ref 70–99)
Glucose-Capillary: 171 mg/dL — ABNORMAL HIGH (ref 70–99)
Glucose-Capillary: 94 mg/dL (ref 70–99)
Glucose-Capillary: 172 mg/dL — ABNORMAL HIGH (ref 70–99)

## 2022-02-25 MED ORDER — OXYCODONE-ACETAMINOPHEN 5-325 MG PO TABS: 1.0000 | ORAL_TABLET | Freq: Four times a day (QID) | ORAL | 0 refills | Status: DC | PRN

## 2022-02-25 MED ORDER — CLOPIDOGREL BISULFATE 75 MG PO TABS: 75.0000 mg | ORAL_TABLET | Freq: Every day | ORAL | 2 refills | Status: DC

## 2022-02-25 NOTE — Discharge Instructions (Signed)
 [facility] [place]  Discharge instructions  Lower Extremity Bypass Surgery  Please refer to the following instruction for your post-procedure care. Your surgeon or physician assistant will discuss any changes with you.  Activity  You are encouraged to walk as much as you can. You can slowly return to normal activities during the month after your surgery. Avoid strenuous activity and heavy lifting until your doctor tells you it's OK. Avoid activities such as vacuuming or swinging a golf club. Do not drive until your doctor give the OK and you are no longer taking prescription pain medications. It is also normal to have difficulty with sleep habits, eating and bowel movement after surgery. These will go away with time.  Bathing/Showering  Shower daily after you go home. Do not soak in a bathtub, hot tub, or swim until the incision heals completely.  Incision Care  Clean your incision with mild soap and water. Shower every day. Pat the area dry with a clean towel. You do not need a bandage unless otherwise instructed. Do not apply any ointments or creams to your incision. If you have open wounds you will be instructed how to care for them or a visiting nurse may be arranged for you. If you have staples or sutures along your incision they will be removed at your post-op appointment. You may have skin glue on your incision. Do not peel it off. It will come off on its own in about one week.  Wash the groin wound with soap and water daily and pat dry. (No tub bath-only shower)  Then put a dry gauze or washcloth in the groin to keep this area dry to help prevent wound infection.  Do this daily and as needed.  Do not use Vaseline or neosporin on your incisions.  Only use soap and water on your incisions and then protect and keep dry.  Diet  Resume your normal diet. There are no special food restrictions following this procedure. A low fat/ low cholesterol diet is  recommended for all patients with vascular disease. In order to heal from your surgery, it is CRITICAL to get adequate nutrition. Your body requires vitamins, minerals, and protein. Vegetables are the best source of vitamins and minerals. Vegetables also provide the perfect balance of protein. Processed food has little nutritional value, so try to avoid this.  Medications  Resume taking all your medications unless your doctor or physician assistant tells you not to. If your incision is causing pain, you may take over-the-counter pain relievers such as acetaminophen (Tylenol). If you were prescribed a stronger pain medication, please aware these medication can cause nausea and constipation. Prevent nausea by taking the medication with a snack or meal. Avoid constipation by drinking plenty of fluids and eating foods with high amount of fiber, such as fruits, vegetables, and grains. Take Colace 100 mg (an over-the-counter stool softener) twice a day as needed for constipation.  Do not take Tylenol if you are taking prescription pain medications.  Follow Up  Our office will schedule a follow up appointment 2-3 weeks following discharge.  Please call us immediately for any of the following conditions  Severe or worsening pain in your legs or feet while at rest or while walking Increase pain, redness, warmth, or drainage (pus) from your incision site(s) Fever of 101 degree or higher The swelling in your leg with the bypass suddenly worsens and becomes more painful than when you were in the hospital If you have   been instructed to feel your graft pulse then you should do so every day. If you can no longer feel this pulse, call the office immediately. Not all patients are given this instruction.  Leg swelling is common after leg bypass surgery.  The swelling should improve over a few months following surgery. To improve the swelling, you may elevate your legs above the level of your heart while you are  sitting or resting. Your surgeon or physician assistant may ask you to apply an ACE wrap or wear compression (TED) stockings to help to reduce swelling.  Reduce your risk of vascular disease  Stop smoking. If you would like help call [facility] at 1-800-QUIT-NOW (1-800-784-8669) or [place] at 336-586-4000.  Manage your cholesterol Maintain a desired weight Control your diabetes weight Control your diabetes Keep your blood pressure down  If you have any questions, please call the office at [phone]  

## 2022-02-25 NOTE — Progress Notes (Deleted)
   02/25/22 1805  What Happened  Was fall witnessed? No  Was patient injured? No  Patient found on floor  Found by Staff-comment  Stated prior activity other (comment) (eating)  Follow Up  MD notified Donzetta Matters  Time MD notified 1805  Family notified Yes - comment  Time family notified 1809  Additional tests No  Simple treatment Dressing  Progress note created (see row info) Yes  Adult Fall Risk Assessment  Risk Factor Category (scoring not indicated) High fall risk per protocol (document High fall risk)  Patient Fall Risk Level High fall risk  Adult Fall Risk Interventions  Required Bundle Interventions *See Row Information* High fall risk - low, moderate, and high requirements implemented  Additional Interventions Use of appropriate toileting equipment (bedpan, BSC, etc.)  Screening for Fall Injury Risk (To be completed on HIGH fall risk patients) - Assessing Need for Floor Mats  Risk For Fall Injury- Criteria for Floor Mats None identified - No additional interventions needed  Will Implement Floor Mats Yes  Vitals  Temp 99 F (37.2 C)  Temp Source Oral  BP 128/76  MAP (mmHg) 91  BP Location Right Arm  BP Method Automatic  Patient Position (if appropriate) Sitting  Pulse Rate 96  Pulse Rate Source Monitor  ECG Heart Rate 97  Cardiac Rhythm NSR  Oxygen Therapy  SpO2 100 %  O2 Device Room Air  Pain Assessment  Pain Scale 0-10  Pain Score 0  Neurological  Neuro (WDL) X  Level of Consciousness Alert  Orientation Level Oriented to person;Oriented to place;Oriented to time  Cognition Impulsive;Poor attention/concentration;Poor judgement;Poor safety awareness;Developmentally delayed  Speech Nods/gestures appropriately;Developmentally delayed  Seizure Activity  Psychomotor Symptoms None  Glasgow Coma Scale  Eye Opening 4  Best Verbal Response (NON-intubated) 4  Best Motor Response 6  Glasgow Coma Scale Score 14  Musculoskeletal  Musculoskeletal (WDL) X  Assistive  Device Front wheel walker  Generalized Weakness Yes  Weight Bearing Restrictions Yes  Musculoskeletal Details  LUE Weakness  RLE Amputated toes  RLE Ortho/Supportive Device Post-op Shoe  LLE Limited movement  Integumentary  Integumentary (WDL) X  Skin Color Appropriate for ethnicity  Skin Condition Dry  Skin Turgor Non-tenting

## 2022-02-25 NOTE — Discharge Summary (Signed)
[facility] Discharge Summary Patient ID: Ryan Hughes [id number] 56 y.o. 1966/05/01  Admit date: 02/17/2022  Discharge date and time: 02/25/2022   Admitting Physician: Maeola Harman, MD   Discharge Physician: Maeola Harman, MD  Admission Diagnoses: PAD (peripheral artery disease) [facility]) [I73.9]  Discharge Diagnoses: PAD (peripheral artery disease) (HCC) [I73.9]  Admission Condition: Fair  Discharged Condition: Fair  Indication for Admission:  56 year old male with history of chronic right lower extremity limb threatening ischemia. Patient does have dementia and lives in a group home but is active and walks without much limitation.  He is undergoing angiography which demonstrates long segment stenosis of his SFA with occlusion of the popliteal artery and tibial arteries are occluded with one-vessel runoff via reconstituted distal peroneal artery.  We have discussed primary above-knee amputation versus bypass with attempted amputation of the first 2 toes and family has decided for the latter for which he presents today.   Hospital Course: Ryan Hughes was admitted on 02/17/22 and underwent right common femoral endarterectomy, Harvest of right greater saphenous vein, Right peroneal endarterectomy, right common femoral to peroneal artery bypass with ipsilateral, translocated, non reversed greater saphenous vein, right lower extremity angiography with stenting of the right peroneal artery into the bypass with a 2.5 x 48 mm Synergy and a transmetatarsal amputation of the right 1st and 2nd toes by Dr. Randie Heinz. He tolerated the surgery well and was taken to the recovery room in stable condition.    By POD#1, Patient did well overnight. Continued perfusion of right lower extremity with brisk Peroneal and PT signals. Right 1st and 2nd toe amputation site well appearing. Pain controlled. Darco shoe ordered for heel weight bearing only.  PT/ OT evaluated patient and recommended SNF. He  had previously came from [facility] group home with plans to return at time of discharge  On POD#3, he remained stable but on daily CBC he demonstrated acute post op blood loss anemia with Hgb of 7.2. 1 Unit of packed red blood cells ordered. Otherwise vitals remained stable and hemodynamically stable. Right leg with adequate perfusion after bypass. Continuing to mobilize. Foley was removed.   The remainder of patients hospital course consisted of follow up labs which demonstrated continued post op blood loss anemia so he received a total of 3 units during hospital admission with improvement in hemoglobin and hematocrit. He remained hemodynamically stable. Once foley was removed he continued to have urinary retention requiring several in and out catheterizations so ultimately a foley was replaced and will remain in at time of discharge. He will have outpatient urology follow up arranged. He otherwise had improved pain control. Tolerated diet. His right leg remained well perfused with brisk Doppler PT and peroneal signals. The right 1st and 2nd toe amputation site remains marginal. 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 We will allow these to continue to demarcate. Despite adequate perfusion he may require further amputation in the future.  He will continue all home medications including Aspirin, Statin and Plavix. Post operative pain medication prescription will be sent with patient at time of discharge. PDMP was reviewed. He remains stable for discharge today back to [facility]. He will have outpatient follow up with urology in 1 week. He will follow up in our office in 2-3 weeks for incision check.  Consults: None  Treatments: antibiotics: Ancef, analgesia: Morphine and oxycodone-acetaminophen, therapies: PT, OT, and SW, and surgery:  1.  Right common femoral endarterectomy 2.  Harvest right greater saphenous vein 3.  Right  peroneal artery endarterectomy 4.  Right common femoral to peroneal artery  bypass with ipsilateral, translocated, nonreversed greater saphenous vein 5.  Right lower extremity angiography 6.  Stent of right peroneal artery into the bypass with 2.5 x 48 mm Synergy 7.  transmetatarsal amputation right first and second toes   Disposition: Discharge disposition: 03-Skilled Nursing Facility       - For [facility] Registry use ---  Post-op:  Wound infection: No  Graft infection: No  Transfusion: Yes  If yes, 3 units given New Arrhythmia: No Patency judged by: Arly.Keller ] Dopper only, [ ]  Palpable graft pulse, [ ]  Palpable distal pulse, [ ]  ABI inc. > 0.15, [ ]  Duplex D/C Ambulatory Status: Ambulatory with Assistance  Complications: MI: ] No, [ ]  Troponin only, [ ]  EKG or Clinical CHF: No Resp failure: ] none, [ ]  Pneumonia, [ ]  Ventilator Chg in renal function: [ X] none, [ ]  Inc. Cr > 0.5, [ ]  Temp. Dialysis, [ ]  Permanent dialysis Stroke: ] None, [ ]  Minor, [ ]  Major Return to OR: No  Reason for return to OR: [ ]  Bleeding, [ ]  Infection, [ ]  Thrombosis, [ ]  Revision  Discharge medications: Statin use:  Yes ASA use:  Yes Plavix use:  Yes Beta blocker use: Yes Coumadin use: No  for medical reason not indicated    Patient Instructions:  Allergies as of 02/25/2022       Reactions   Pork-derived Products Other (See Comments)   Causes gout flares- "allergic," per Order Summary    Shellfish-derived Products Other (See Comments)   Causes gout flares- "allergic," per Order Summary         Medication List     TAKE these medications    acetaminophen 325 MG tablet Commonly known as: TYLENOL Take 650 mg by mouth every 4 (four) hours as needed (for pain).   aspirin 325 MG tablet Take 1 tablet (325 mg total) by mouth daily.   atorvastatin 80 MG tablet Commonly known as: LIPITOR Take 1 tablet (80 mg total) by mouth at bedtime. What changed: when to take this   carvedilol 12.5 MG tablet Commonly known as: COREG Take 1 tablet (12.5 mg total) by  mouth 2 (two) times daily with a meal.   clopidogrel 75 MG tablet Commonly known as: PLAVIX Take 1 tablet (75 mg total) by mouth daily at 6 (six) AM. Start taking on: February 26, 2022   dutasteride 0.5 MG capsule Commonly known as: AVODART Take 1 capsule (0.5 mg total) by mouth daily.   insulin aspart 100 UNIT/ML injection Commonly known as: novoLOG Inject 0-15 Units into the skin 3 (three) times daily with meals. CBG 70 - 120: 0 units  CBG 121 - 150: 2 units  CBG 151 - 200: 3 units  CBG 201 - 250: 5 units  CBG 251 - 300: 8 units  CBG 301 - 350: 11 units  CBG 351 - 400: 15 units   losartan 50 MG tablet Commonly known as: COZAAR Take 1 tablet (50 mg total) by mouth daily.   metFORMIN 1000 MG tablet Commonly known as: GLUCOPHAGE Take 1 tablet (1,000 mg total) by mouth 2 (two) times daily with a meal.   multivitamin with minerals Tabs tablet Take 1 tablet by mouth daily.   oxyCODONE-acetaminophen 5-325 MG tablet Commonly known as: PERCOCET/ROXICET Take 1 tablet by mouth every 6 (six) hours as needed for moderate pain.   pantoprazole 40 MG tablet Commonly known  as: PROTONIX Take 1 tablet (40 mg total) by mouth 2 (two) times daily.   senna-docusate 8.6-50 MG tablet Commonly known as: Senokot-S Take 1 tablet by mouth at bedtime as needed for mild constipation.   tamsulosin 0.4 MG Caps capsule Commonly known as: FLOMAX Take 2 capsules (0.8 mg total) by mouth daily.   thiamine 100 MG tablet Commonly known as: VITAMIN B1 Take 1 tablet (100 mg total) by mouth daily.   Trulicity 1.58 XE/9.4MH Sopn Generic drug: Dulaglutide Inject 0.75 mg into the skin once a week. What changed: when to take this       Activity: activity as tolerated and no heavy lifting for 6 weeks Diet: diabetic diet and low fat, low cholesterol diet Wound Care:  wash incisions with mild soap and water daily. Do not soak in bathtub. Apply dry gauze to right groin daily to wick moisture. Apply  betadine to 3rd and 5th toes daily.   Follow-up with Dr. Donzetta Matters in  2-3   weeks for incision check.  Signed: Karoline Caldwell 02/25/2022 12:31 PM

## 2022-02-25 NOTE — Progress Notes (Signed)
   02/25/22 1805  What Happened  Was fall witnessed? No  Was patient injured? No  Patient found on floor  Found by Staff-comment  Stated prior activity other (comment) (eating)  Follow Up  MD notified Cain  Time MD notified 1805  Family notified Yes - comment  Time family notified 1809  Additional tests No  Simple treatment Dressing  Progress note created (see row info) Yes  Adult Fall Risk Assessment  Risk Factor Category (scoring not indicated) High fall risk per protocol (document High fall risk)  Patient Fall Risk Level High fall risk  Adult Fall Risk Interventions  Required Bundle Interventions *See Row Information* High fall risk - low, moderate, and high requirements implemented  Additional Interventions Use of appropriate toileting equipment (bedpan, BSC, etc.)  Screening for Fall Injury Risk (To be completed on HIGH fall risk patients) - Assessing Need for Floor Mats  Risk For Fall Injury- Criteria for Floor Mats None identified - No additional interventions needed  Will Implement Floor Mats Yes  Vitals  Temp 99 F (37.2 C)  Temp Source Oral  BP 128/76  MAP (mmHg) 91  BP Location Right Arm  BP Method Automatic  Patient Position (if appropriate) Sitting  Pulse Rate 96  Pulse Rate Source Monitor  ECG Heart Rate 97  Cardiac Rhythm NSR  Oxygen Therapy  SpO2 100 %  O2 Device Room Air  Pain Assessment  Pain Scale 0-10  Pain Score 0  Neurological  Neuro (WDL) X  Level of Consciousness Alert  Orientation Level Oriented to person;Oriented to place;Oriented to time  Cognition Impulsive;Poor attention/concentration;Poor judgement;Poor safety awareness;Developmentally delayed  Speech Nods/gestures appropriately;Developmentally delayed  Seizure Activity  Psychomotor Symptoms None  Glasgow Coma Scale  Eye Opening 4  Best Verbal Response (NON-intubated) 4  Best Motor Response 6  Glasgow Coma Scale Score 14  Musculoskeletal  Musculoskeletal (WDL) X  Assistive  Device Front wheel walker  Generalized Weakness Yes  Weight Bearing Restrictions Yes  Musculoskeletal Details  LUE Weakness  RLE Amputated toes  RLE Ortho/Supportive Device Post-op Shoe  LLE Limited movement  Integumentary  Integumentary (WDL) X  Skin Color Appropriate for ethnicity  Skin Condition Dry  Skin Turgor Non-tenting    

## 2022-02-25 NOTE — TOC Transition Note (Signed)
Transition of Care [facility]) - CM/SW Discharge Note   Patient Details  Name: Ryan Hughes MRN: [id number] Date of Birth: [birth year]  Transition of Care [facility] CM/SW Contact:  Vinie Sill, LCSW Phone Number: 02/25/2022, 12:28 PM   Clinical Narrative:     Patient will Discharge to: [facility] Discharge Date: 02/25/2022 Family Notified: Niece Ryan Hughes Transport By: Corey Harold   Per MD patient is ready for discharge. RN, patient, and facility notified of discharge. Discharge Summary sent to facility. RN given number for [phone] RM [phone]. Ambulance transport requested for patient.   Clinical Social Worker signing off.  Thurmond Butts, MSW, LCSW Clinical Social Worker     Final next level of care: Skilled Nursing Facility Barriers to Discharge: Barriers Resolved   Patient Goals and CMS Choice Patient states their goals for this hospitalization and ongoing recovery are:: Pt limited verbally, unable to participate in goal setting. CMS Medicare.gov Compare Post Acute Care list provided to:: Patient Represenative (must comment) (Niece)    Discharge Placement              Patient [facility] bed at: [facility] Patient to be transferred to facility by: [place] Name of family member notified: Niece Patient and family notified of of transfer: 02/25/22  Discharge Plan and Services     Post Acute Care Choice: [place]                               Social Determinants of Health (SDOH) Interventions     Readmission Risk Interventions     No data to display

## 2022-02-25 NOTE — Progress Notes (Addendum)
  Progress Note    02/25/2022 7:23 AM 8 Days Post-Op  Subjective:  no complaints   Vitals:   02/24/22 2338 02/25/22 0345  BP: 136/76 (!) 140/81  Pulse: 82 77  Resp: 18 18  Temp: 98.6 F (37 C) 98.6 F (37 C)  SpO2: 98% 100%   Physical Exam: Cardiac:  regular Lungs:  non labored Incisions:  right groin incision with dry gauze. Right groin soft without drainage. Right leg incisions are all clean, dry and intact Extremities:  R foot dressings c/d/I. 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 Duskiness of dorsum of foot but  tissue appears viable. Brisk doppler DP/ PT/ peroneal signals Neurologic: alert    CBC    Component Value Date/Time   WBC 15.5 (H) 02/22/2022 2004   RBC 3.14 (L) 02/22/2022 2004   HGB 9.4 (L) 02/22/2022 2004   HGB 15.3 12/08/2021 1102   HCT 26.8 (L) 02/22/2022 2004   HCT 46.8 12/08/2021 1102   PLT 414 (H) 02/22/2022 2004   PLT 307 12/08/2021 1102   MCV 85.4 02/22/2022 2004   MCV 91 12/08/2021 1102   MCH 29.9 02/22/2022 2004   MCHC 35.1 02/22/2022 2004   RDW 15.4 02/22/2022 2004   RDW 13.4 12/08/2021 1102   LYMPHSABS 3.3 01/31/2022 1459   LYMPHSABS 1.9 12/08/2021 1102   MONOABS 0.5 01/31/2022 1459   EOSABS 0.1 01/31/2022 1459   EOSABS 0.0 12/08/2021 1102   BASOSABS 0.0 01/31/2022 1459   BASOSABS 0.0 12/08/2021 1102    BMET    Component Value Date/Time   NA 142 02/22/2022 0103   NA 138 12/08/2021 1102   K 3.6 02/22/2022 0103   CL 110 02/22/2022 0103   CO2 22 02/22/2022 0103   GLUCOSE 141 (H) 02/22/2022 0103   BUN 28 (H) 02/22/2022 0103   BUN 14 12/08/2021 1102   CREATININE 1.02 02/22/2022 0103   CALCIUM 8.7 (L) 02/22/2022 0103   GFRNONAA >60 02/22/2022 0103   GFRAA >60 03/17/2019 0839    INR    Component Value Date/Time   INR 1.0 02/17/2022 0552     Intake/Output Summary (Last 24 hours) at 02/25/2022 0723 Last data filed at 02/25/2022 0346 Gross per 24 hour  Intake 240 ml  Output 250 ml  Net -10 ml     Assessment/Plan:  56  y.o. male is s/p R femoral to peroneal bypass with vein and 1st and 2nd toe amps  8 Days Post-Op   R foot well perfused by doppler exam; RLE incisions healing well Will allow further  demarcation of 3rd and 5th toes. May require further toe amps/TMA at a later date Follow up with urology as an outpatient due to urinary retention; continue foley Continue dry dressing changes to right groin daily to wick moisture Waiting on Authorization to return to [place]. Possible d/c later today   Karoline Caldwell, PA-C [facility] [phone] 02/25/2022 7:23 AM  I have independently interviewed and examined patient and agree with PA assessment and plan above.  His foot continues to demarcate will be at high risk for requiring above-knee amputation despite patent bypass with strong peroneal signal.  I have attempted to contact his niece and will try again later.  Possible discharge to [facility] and we will see him back in short interval for evaluation of foot wounds.  Jabari Swoveland C. Donzetta Matters, MD [facility] [place] Office: [phone] Pager: [phone]

## 2022-02-25 NOTE — Progress Notes (Signed)
PT Cancellation Note  Patient Details Name: Ryan Hughes MRN: [id number] DOB: Aug 02, 1965   Cancelled Treatment:    Reason Eval/Treat Not Completed: Per RN, pt preparing for d/c to SNF; will defer treatment.  Mabeline Caras, PT, DPT Acute Rehabilitation Services  Personal: [facility] Office: [place] 02/25/2022, 5:11 PM

## 2022-02-25 NOTE — Progress Notes (Signed)
OT Cancellation Note  Patient Details Name: Ryan Hughes MRN: [id number] DOB: 1965/12/15   Cancelled Treatment:    Reason Eval/Treat Not Completed: Patient declined, no reason specified (Pt declined participating in OT tx session. Non verbal with OT although shook head no. Per chart, plan is to discharge today and return to [facility].)  Ailene Ravel, OTR/L,CBIS  Supplemental OT - [place] and Dirk Dress  02/25/2022, 2:13 PM

## 2022-03-15 NOTE — Progress Notes (Unsigned)
POST OPERATIVE OFFICE NOTE    CC:  F/u for surgery  HPI:  This is a 56 y.o. male who is s/p Right common femoral endarterectomy followed by  Right common femoral to peroneal artery bypass with ipsilateral, translocated, nonreversed greater saphenous vein Stent of right peroneal artery into the bypass  transmetatarsal amputation right first and second toes followed on 02/17/22 by Dr. Donzetta Matters.    Pt returns today for follow up.  Pt states ***   Allergies  Allergen Reactions   Pork-Derived Products Other (See Comments)    Causes gout flares- "allergic," per Order Summary    Shellfish-Derived Products Other (See Comments)    Causes gout flares- "allergic," per Order Summary     Current Outpatient Medications  Medication Sig Dispense Refill   acetaminophen (TYLENOL) 325 MG tablet Take 650 mg by mouth every 4 (four) hours as needed (for pain).     aspirin 325 MG tablet Take 1 tablet (325 mg total) by mouth daily.     atorvastatin (LIPITOR) 80 MG tablet Take 1 tablet (80 mg total) by mouth at bedtime. (Patient taking differently: Take 80 mg by mouth every evening.) 90 tablet 0   carvedilol (COREG) 12.5 MG tablet Take 1 tablet (12.5 mg total) by mouth 2 (two) times daily with a meal.     clopidogrel (PLAVIX) 75 MG tablet Take 1 tablet (75 mg total) by mouth daily at 6 (six) AM. 30 tablet 2   Dulaglutide (TRULICITY) 1.54 MG/8.6PY SOPN Inject 0.75 mg into the skin once a week. (Patient taking differently: Inject 0.75 mg into the skin every Thursday.) 2 mL 3   dutasteride (AVODART) 0.5 MG capsule Take 1 capsule (0.5 mg total) by mouth daily. 30 capsule 3   insulin aspart (NOVOLOG) 100 UNIT/ML injection Inject 0-15 Units into the skin 3 (three) times daily with meals. CBG 70 - 120: 0 units  CBG 121 - 150: 2 units  CBG 151 - 200: 3 units  CBG 201 - 250: 5 units  CBG 251 - 300: 8 units  CBG 301 - 350: 11 units  CBG 351 - 400: 15 units 10 mL 11   losartan (COZAAR) 50 MG tablet Take 1 tablet (50 mg  total) by mouth daily. 30 tablet 3   metFORMIN (GLUCOPHAGE) 1000 MG tablet Take 1 tablet (1,000 mg total) by mouth 2 (two) times daily with a meal. 180 tablet 1   Multiple Vitamin (MULTIVITAMIN WITH MINERALS) TABS tablet Take 1 tablet by mouth daily. 30 tablet 0   oxyCODONE-acetaminophen (PERCOCET/ROXICET) 5-325 MG tablet Take 1 tablet by mouth every 6 (six) hours as needed for moderate pain. 30 tablet 0   pantoprazole (PROTONIX) 40 MG tablet Take 1 tablet (40 mg total) by mouth 2 (two) times daily. 603 tablet 0   senna-docusate (SENOKOT-S) 8.6-50 MG tablet Take 1 tablet by mouth at bedtime as needed for mild constipation.     tamsulosin (FLOMAX) 0.4 MG CAPS capsule Take 2 capsules (0.8 mg total) by mouth daily. 30 capsule 3   thiamine (VITAMIN B1) 100 MG tablet Take 1 tablet (100 mg total) by mouth daily. 30 tablet 0   No current facility-administered medications for this visit.     ROS:  See HPI  Physical Exam:  ***  Incision:  *** Extremities:  *** Neuro: *** Abdomen:  ***    Assessment/Plan:  This is a 56 y.o. male who is s/p: ***  -***   Leontine Locket, [facility] [phone]  Clinic MD:  ***

## 2022-03-16 ENCOUNTER — Other Ambulatory Visit: Payer: Self-pay

## 2022-03-16 ENCOUNTER — Encounter: Payer: Self-pay | Admitting: Physician Assistant

## 2022-03-16 ENCOUNTER — Ambulatory Visit (INDEPENDENT_AMBULATORY_CARE_PROVIDER_SITE_OTHER): Payer: Medicaid Other | Admitting: Physician Assistant

## 2022-03-16 VITALS — BP 128/76 | HR 89 | Temp 98.0°F | Resp 20 | Ht 62.0 in

## 2022-03-16 DIAGNOSIS — I70221 Atherosclerosis of native arteries of extremities with rest pain, right leg: Secondary | ICD-10-CM

## 2022-03-16 NOTE — Progress Notes (Signed)
POST OPERATIVE OFFICE NOTE    CC:  F/u for surgery  HPI:  This is a 56 y.o. male who is s/p  R femoral to peroneal bypass, stent of right peroneal artery into the bypass  with vein and 1st and 2nd toe amps for  chronic right lower extremity limb threatening ischemia on 02/17/22 by DR. Cain.  He is here today for wound and incision checks.  He was discharged on ASA, Plavix and Statin.    Pt returns today for follow up.  Pt states He does nopt have apin.   Allergies  Allergen Reactions   Pork-Derived Products Other (See Comments)    Causes gout flares- "allergic," per Order Summary    Shellfish-Derived Products Other (See Comments)    Causes gout flares- "allergic," per Order Summary     Current Outpatient Medications  Medication Sig Dispense Refill   acetaminophen (TYLENOL) 325 MG tablet Take 650 mg by mouth every 4 (four) hours as needed (for pain).     aspirin 325 MG tablet Take 1 tablet (325 mg total) by mouth daily.     atorvastatin (LIPITOR) 80 MG tablet Take 1 tablet (80 mg total) by mouth at bedtime. (Patient taking differently: Take 80 mg by mouth every evening.) 90 tablet 0   carvedilol (COREG) 12.5 MG tablet Take 1 tablet (12.5 mg total) by mouth 2 (two) times daily with a meal.     clopidogrel (PLAVIX) 75 MG tablet Take 1 tablet (75 mg total) by mouth daily at 6 (six) AM. 30 tablet 2   Dulaglutide (TRULICITY) 0.75 MG/0.5ML SOPN Inject 0.75 mg into the skin once a week. (Patient taking differently: Inject 0.75 mg into the skin every Thursday.) 2 mL 3   dutasteride (AVODART) 0.5 MG capsule Take 1 capsule (0.5 mg total) by mouth daily. 30 capsule 3   insulin aspart (NOVOLOG) 100 UNIT/ML injection Inject 0-15 Units into the skin 3 (three) times daily with meals. CBG 70 - 120: 0 units  CBG 121 - 150: 2 units  CBG 151 - 200: 3 units  CBG 201 - 250: 5 units  CBG 251 - 300: 8 units  CBG 301 - 350: 11 units  CBG 351 - 400: 15 units 10 mL 11   losartan (COZAAR) 50 MG tablet Take  1 tablet (50 mg total) by mouth daily. 30 tablet 3   metFORMIN (GLUCOPHAGE) 1000 MG tablet Take 1 tablet (1,000 mg total) by mouth 2 (two) times daily with a meal. 180 tablet 1   Multiple Vitamin (MULTIVITAMIN WITH MINERALS) TABS tablet Take 1 tablet by mouth daily. 30 tablet 0   oxyCODONE-acetaminophen (PERCOCET/ROXICET) 5-325 MG tablet Take 1 tablet by mouth every 6 (six) hours as needed for moderate pain. 30 tablet 0   pantoprazole (PROTONIX) 40 MG tablet Take 1 tablet (40 mg total) by mouth 2 (two) times daily. 603 tablet 0   senna-docusate (SENOKOT-S) 8.6-50 MG tablet Take 1 tablet by mouth at bedtime as needed for mild constipation.     tamsulosin (FLOMAX) 0.4 MG CAPS capsule Take 2 capsules (0.8 mg total) by mouth daily. 30 capsule 3   thiamine (VITAMIN B1) 100 MG tablet Take 1 tablet (100 mg total) by mouth daily. 30 tablet 0   No current facility-administered medications for this visit.     ROS:  See HPI  Physical Exam:    Incision:  non healing with increased tissue loss     Assessment/Plan:  This is a 56 y.o. male who   is s/p: R femoral to peroneal bypass, stent of right peroneal artery into the bypass  with vein and 1st and 2nd toe amps for  chronic right lower extremity limb threatening ischemia on 02/17/22 by DR. Randie Heinz. The tissue loss is to great and now we will plan right LE AKA in the near future.  He denies feeling ill, no fever or chills.  Dry dressing change PRN.     Mosetta Pigeon PA-C [facility] [phone]   Clinic MD:  Randie Heinz

## 2022-03-16 NOTE — H&P (View-Only) (Signed)
POST OPERATIVE OFFICE NOTE    CC:  F/u for surgery  HPI:  This is a 56 y.o. male who is s/p  R femoral to peroneal bypass, stent of right peroneal artery into the bypass  with vein and 1st and 2nd toe amps for  chronic right lower extremity limb threatening ischemia on 02/17/22 by DR. Donzetta Matters.  He is here today for wound and incision checks.  He was discharged on ASA, Plavix and Statin.    Pt returns today for follow up.  Pt states He does nopt have apin.   Allergies  Allergen Reactions   Pork-Derived Products Other (See Comments)    Causes gout flares- "allergic," per Order Summary    Shellfish-Derived Products Other (See Comments)    Causes gout flares- "allergic," per Order Summary     Current Outpatient Medications  Medication Sig Dispense Refill   acetaminophen (TYLENOL) 325 MG tablet Take 650 mg by mouth every 4 (four) hours as needed (for pain).     aspirin 325 MG tablet Take 1 tablet (325 mg total) by mouth daily.     atorvastatin (LIPITOR) 80 MG tablet Take 1 tablet (80 mg total) by mouth at bedtime. (Patient taking differently: Take 80 mg by mouth every evening.) 90 tablet 0   carvedilol (COREG) 12.5 MG tablet Take 1 tablet (12.5 mg total) by mouth 2 (two) times daily with a meal.     clopidogrel (PLAVIX) 75 MG tablet Take 1 tablet (75 mg total) by mouth daily at 6 (six) AM. 30 tablet 2   Dulaglutide (TRULICITY) [id number] SOPN Inject 0.75 mg into the skin once a week. (Patient taking differently: Inject 0.75 mg into the skin every Thursday.) 2 mL 3   dutasteride (AVODART) 0.5 MG capsule Take 1 capsule (0.5 mg total) by mouth daily. 30 capsule 3   insulin aspart (NOVOLOG) 100 UNIT/ML injection Inject 0-15 Units into the skin 3 (three) times daily with meals. CBG 70 - 120: 0 units  CBG 121 - 150: 2 units  CBG 151 - 200: 3 units  CBG 201 - 250: 5 units  CBG 251 - 300: 8 units  CBG 301 - 350: 11 units  CBG 351 - 400: 15 units 10 mL 11   losartan (COZAAR) 50 MG tablet Take  1 tablet (50 mg total) by mouth daily. 30 tablet 3   metFORMIN (GLUCOPHAGE) 1000 MG tablet Take 1 tablet (1,000 mg total) by mouth 2 (two) times daily with a meal. 180 tablet 1   Multiple Vitamin (MULTIVITAMIN WITH MINERALS) TABS tablet Take 1 tablet by mouth daily. 30 tablet 0   oxyCODONE-acetaminophen (PERCOCET/ROXICET) 5-325 MG tablet Take 1 tablet by mouth every 6 (six) hours as needed for moderate pain. 30 tablet 0   pantoprazole (PROTONIX) 40 MG tablet Take 1 tablet (40 mg total) by mouth 2 (two) times daily. 603 tablet 0   senna-docusate (SENOKOT-S) 8.6-50 MG tablet Take 1 tablet by mouth at bedtime as needed for mild constipation.     tamsulosin (FLOMAX) 0.4 MG CAPS capsule Take 2 capsules (0.8 mg total) by mouth daily. 30 capsule 3   thiamine (VITAMIN B1) 100 MG tablet Take 1 tablet (100 mg total) by mouth daily. 30 tablet 0   No current facility-administered medications for this visit.     ROS:  See HPI  Physical Exam:    Incision:  non healing with increased tissue loss     Assessment/Plan:  This is a 56 y.o. male who  is s/p: R femoral to peroneal bypass, stent of right peroneal artery into the bypass  with vein and 1st and 2nd toe amps for  chronic right lower extremity limb threatening ischemia on 02/17/22 by DR. Randie Heinz. The tissue loss is to great and now we will plan right LE AKA in the near future.  He denies feeling ill, no fever or chills.  Dry dressing change PRN.     Mosetta Pigeon PA-C [facility] [phone]   Clinic MD:  Randie Heinz

## 2022-03-18 ENCOUNTER — Encounter (HOSPITAL_COMMUNITY): Payer: Self-pay | Admitting: Vascular Surgery

## 2022-03-21 ENCOUNTER — Encounter (HOSPITAL_COMMUNITY): Payer: Self-pay | Admitting: Vascular Surgery

## 2022-03-21 NOTE — Progress Notes (Signed)
  PCP - Gwinda Passe, NP Cardiologist - n/a  Chest x-ray - 04/03/21 EKG - 12/14/21 Stress Test - n/a ECHO - 03/28/21 Cardiac Cath - n/a  ICD Pacemaker/Loop - n/a  Sleep Study -  n/a CPAP - none  Last dose of Trulicity (dulaglutide) was on 03/17/22.  Do not take Metformin on the morning of surgery.  If your CBG is greater than 220 mg/dL, you may take  of your sliding scale (correction) dose of Novolog Insulin.  If your blood sugar is less than 70 mg/dL, you will need to treat for low blood sugar: Treat a low blood sugar (less than 70 mg/dL) with  cup of clear juice (cranberry or apple), 4 glucose tablets, OR glucose gel. Recheck blood sugar in 15 minutes after treatment (to make sure it is greater than 70 mg/dL). If your blood sugar is not greater than 70 mg/dL on recheck, call [phone] for further instructions.  Blood Thinner Instructions:  Follow your surgeon's instructions on when to stop prior Plavix to surgery.  Aspirin Instructions: Follow your surgeon's instructions on when to stop aspirin prior to surgery,  If no instructions were given by your surgeon then you will need to call the office for those instructions.  Anesthesia review: Yes  STOP now taking any Aspirin (unless otherwise instructed by your surgeon), Aleve, Naproxen, Ibuprofen, Motrin, Advil, Goody's, BC's, all herbal medications, fish oil, and all vitamins.   Coronavirus Screening Does the patient have any of the following symptoms:  Cough yes/no: No Fever (>100.101F)  yes/no: No Runny nose yes/no: No Sore throat yes/no: No Difficulty breathing/shortness of breath  yes/no: No  Has the patient traveled in the last 14 days and where? yes/no: No

## 2022-03-21 NOTE — Progress Notes (Signed)
Surgical Instructions for [facility] Day of Surgery - Tuesday, 03/22/22    Your procedure is scheduled on Tuesday, 03/22/22.  Report to [facility] Main Entrance "A" at 10:30 A.M., then check in with the Admitting office.  Call this number if you have problems the morning of surgery:  [phone]   If you have any questions prior to your surgery date call [phone]: Open Monday-Friday 8am-4pm If you experience any cold or flu symptoms such as cough, fever, chills, shortness of breath, etc. between now and your scheduled surgery, please notify us at the above number     Remember:  Do not eat or drink after midnight tonight-Monday (02/18/22).       Take these medicines the morning of surgery with A SIP OF WATER:  acetaminophen (TYLENOL) if needed carvedilol (COREG)  dutasteride (AVODART)  oxyCODONE-acetaminophen if needed pantoprazole (PROTONIX)  tamsulosin (FLOMAX)     As of today, STOP taking any Aspirin (unless otherwise instructed by your surgeon) Aleve, Naproxen, Ibuprofen, Motrin, Advil, Goody's, BC's, all herbal medications, fish oil, and all vitamins.           Do not wear jewelry  Do not wear lotions, powders, cologne or deodorant. Men may shave face and neck. Do not bring valuables to the hospital.   [facility] is not responsible for any belongings or valuables.    Do NOT Smoke (Tobacco/Vaping)  24 hours prior to your procedure  If you use a CPAP at night, you may bring your mask for your overnight stay.   Contacts, glasses, hearing aids, dentures or partials may not be worn into surgery, please bring cases for these belongings   For patients admitted to the hospital, discharge time will be determined by your treatment team.     Special instructions:    Oral Hygiene is also important to reduce your risk of infection.  Remember - BRUSH YOUR TEETH THE MORNING OF SURGERY WITH YOUR REGULAR TOOTHPASTE    SURGICAL WAITING ROOM VISITATION Patients having  surgery or a procedure may have no more than 2 support people in the waiting area - these visitors may rotate.   Children under the age of 23 must have an adult with them who is not the patient. If the patient needs to stay at the hospital during part of their recovery, the visitor guidelines for inpatient rooms apply. Pre-op nurse will coordinate an appropriate time for 1 support person to accompany patient in pre-op.  This support person may not rotate.   Please refer to [email or web address] for the visitor guidelines for Inpatients (after your surgery is over and you are in a regular room).

## 2022-03-21 NOTE — Progress Notes (Signed)
Patient resides at [facility].  Left  a message for [facility] Admissions Director to return my call (in staff meeting).

## 2022-03-22 ENCOUNTER — Other Ambulatory Visit: Payer: Self-pay

## 2022-03-22 ENCOUNTER — Encounter (HOSPITAL_COMMUNITY)
Admission: AD | Disposition: A | Discharge status: Skilled Nursing Facility | Payer: Self-pay | Source: Home / Self Care | Attending: Internal Medicine

## 2022-03-22 ENCOUNTER — Encounter (HOSPITAL_COMMUNITY): Payer: Self-pay | Admitting: Vascular Surgery

## 2022-03-22 ENCOUNTER — Inpatient Hospital Stay (HOSPITAL_COMMUNITY)
Admission: AD | Admit: 2022-03-22 | Discharge: 2022-03-26 | DRG: 240 | Disposition: A | Discharge status: Skilled Nursing Facility | Payer: Medicaid Other | Attending: Internal Medicine | Admitting: Internal Medicine

## 2022-03-22 ENCOUNTER — Inpatient Hospital Stay (HOSPITAL_COMMUNITY): Payer: Medicaid Other | Admitting: Physician Assistant

## 2022-03-22 ENCOUNTER — Inpatient Hospital Stay (HOSPITAL_BASED_OUTPATIENT_CLINIC_OR_DEPARTMENT_OTHER): Payer: Medicaid Other | Admitting: Physician Assistant

## 2022-03-22 DIAGNOSIS — Z7984 Long term (current) use of oral hypoglycemic drugs: Secondary | ICD-10-CM

## 2022-03-22 DIAGNOSIS — F1021 Alcohol dependence, in remission: Secondary | ICD-10-CM | POA: Diagnosis present

## 2022-03-22 DIAGNOSIS — Z22322 Carrier or suspected carrier of Methicillin resistant Staphylococcus aureus: Secondary | ICD-10-CM

## 2022-03-22 DIAGNOSIS — Z87891 Personal history of nicotine dependence: Secondary | ICD-10-CM

## 2022-03-22 DIAGNOSIS — Z823 Family history of stroke: Secondary | ICD-10-CM

## 2022-03-22 DIAGNOSIS — E1169 Type 2 diabetes mellitus with other specified complication: Secondary | ICD-10-CM | POA: Diagnosis not present

## 2022-03-22 DIAGNOSIS — D638 Anemia in other chronic diseases classified elsewhere: Secondary | ICD-10-CM | POA: Diagnosis not present

## 2022-03-22 DIAGNOSIS — E785 Hyperlipidemia, unspecified: Secondary | ICD-10-CM | POA: Diagnosis present

## 2022-03-22 DIAGNOSIS — Z7982 Long term (current) use of aspirin: Secondary | ICD-10-CM

## 2022-03-22 DIAGNOSIS — E119 Type 2 diabetes mellitus without complications: Secondary | ICD-10-CM

## 2022-03-22 DIAGNOSIS — N401 Enlarged prostate with lower urinary tract symptoms: Secondary | ICD-10-CM | POA: Diagnosis not present

## 2022-03-22 DIAGNOSIS — E876 Hypokalemia: Secondary | ICD-10-CM | POA: Diagnosis present

## 2022-03-22 DIAGNOSIS — Z794 Long term (current) use of insulin: Secondary | ICD-10-CM

## 2022-03-22 DIAGNOSIS — R3916 Straining to void: Secondary | ICD-10-CM | POA: Diagnosis present

## 2022-03-22 DIAGNOSIS — E1152 Type 2 diabetes mellitus with diabetic peripheral angiopathy with gangrene: Principal | ICD-10-CM | POA: Diagnosis present

## 2022-03-22 DIAGNOSIS — Z7985 Long-term (current) use of injectable non-insulin antidiabetic drugs: Secondary | ICD-10-CM

## 2022-03-22 DIAGNOSIS — Z8673 Personal history of transient ischemic attack (TIA), and cerebral infarction without residual deficits: Secondary | ICD-10-CM

## 2022-03-22 DIAGNOSIS — I96 Gangrene, not elsewhere classified: Secondary | ICD-10-CM | POA: Diagnosis present

## 2022-03-22 DIAGNOSIS — E1151 Type 2 diabetes mellitus with diabetic peripheral angiopathy without gangrene: Secondary | ICD-10-CM | POA: Diagnosis present

## 2022-03-22 DIAGNOSIS — E1159 Type 2 diabetes mellitus with other circulatory complications: Secondary | ICD-10-CM | POA: Diagnosis present

## 2022-03-22 DIAGNOSIS — I739 Peripheral vascular disease, unspecified: Secondary | ICD-10-CM | POA: Diagnosis present

## 2022-03-22 DIAGNOSIS — Z79899 Other long term (current) drug therapy: Secondary | ICD-10-CM

## 2022-03-22 DIAGNOSIS — D62 Acute posthemorrhagic anemia: Secondary | ICD-10-CM | POA: Insufficient documentation

## 2022-03-22 DIAGNOSIS — T82392A Other mechanical complication of femoral arterial graft (bypass), initial encounter: Secondary | ICD-10-CM | POA: Diagnosis not present

## 2022-03-22 DIAGNOSIS — Z91014 Allergy to mammalian meats: Secondary | ICD-10-CM

## 2022-03-22 DIAGNOSIS — D72829 Elevated white blood cell count, unspecified: Secondary | ICD-10-CM | POA: Diagnosis present

## 2022-03-22 DIAGNOSIS — I70222 Atherosclerosis of native arteries of extremities with rest pain, left leg: Secondary | ICD-10-CM | POA: Diagnosis present

## 2022-03-22 DIAGNOSIS — E782 Mixed hyperlipidemia: Secondary | ICD-10-CM | POA: Diagnosis present

## 2022-03-22 DIAGNOSIS — I70221 Atherosclerosis of native arteries of extremities with rest pain, right leg: Principal | ICD-10-CM | POA: Diagnosis present

## 2022-03-22 DIAGNOSIS — I10 Essential (primary) hypertension: Secondary | ICD-10-CM | POA: Diagnosis present

## 2022-03-22 DIAGNOSIS — Z91013 Allergy to seafood: Secondary | ICD-10-CM

## 2022-03-22 HISTORY — DX: Benign prostatic hyperplasia without lower urinary tract symptoms: N40.0

## 2022-03-22 HISTORY — DX: Hyperlipidemia, unspecified: E78.5

## 2022-03-22 HISTORY — PX: AMPUTATION: SHX166

## 2022-03-22 HISTORY — DX: Cerebral infarction, unspecified: I63.9

## 2022-03-22 LAB — CBC
HCT: 28.7 % — ABNORMAL LOW (ref 39.0–52.0)
HCT: 23.5 % — ABNORMAL LOW (ref 39.0–52.0)
Hemoglobin: 9 g/dL — ABNORMAL LOW (ref 13.0–17.0)
Hemoglobin: 7.7 g/dL — ABNORMAL LOW (ref 13.0–17.0)
MCH: 28.1 pg (ref 26.0–34.0)
MCH: 28.4 pg (ref 26.0–34.0)
MCHC: 31.4 g/dL (ref 30.0–36.0)
MCHC: 32.8 g/dL (ref 30.0–36.0)
MCV: 89.7 fL (ref 80.0–100.0)
MCV: 86.7 fL (ref 80.0–100.0)
Platelets: 420 10*3/uL — ABNORMAL HIGH (ref 150–400)
Platelets: 333 10*3/uL (ref 150–400)
RBC: 3.2 MIL/uL — ABNORMAL LOW (ref 4.22–5.81)
RBC: 2.71 MIL/uL — ABNORMAL LOW (ref 4.22–5.81)
RDW: 15 % (ref 11.5–15.5)
RDW: 15.3 % (ref 11.5–15.5)
WBC: 9 10*3/uL (ref 4.0–10.5)
WBC: 9.9 10*3/uL (ref 4.0–10.5)
nRBC: 0 % (ref 0.0–0.2)
nRBC: 0 % (ref 0.0–0.2)

## 2022-03-22 LAB — COMPREHENSIVE METABOLIC PANEL
ALT: 14 U/L (ref 0–44)
AST: 23 U/L (ref 15–41)
Albumin: 2.4 g/dL — ABNORMAL LOW (ref 3.5–5.0)
Alkaline Phosphatase: 79 U/L (ref 38–126)
Anion gap: 13 (ref 5–15)
BUN: 12 mg/dL (ref 6–20)
CO2: 25 mmol/L (ref 22–32)
Calcium: 7.5 mg/dL — ABNORMAL LOW (ref 8.9–10.3)
Chloride: 103 mmol/L (ref 98–111)
Creatinine, Ser: 1.18 mg/dL (ref 0.61–1.24)
GFR, Estimated: >60 mL/min (ref >60)
Glucose, Bld: 206 mg/dL — ABNORMAL HIGH (ref 70–99)
Potassium: 2.9 mmol/L — ABNORMAL LOW (ref 3.5–5.1)
Sodium: 141 mmol/L (ref 135–145)
Total Bilirubin: 0.4 mg/dL (ref 0.3–1.2)
Total Protein: 7 g/dL (ref 6.5–8.1)

## 2022-03-22 LAB — GLUCOSE, CAPILLARY
Glucose-Capillary: 153 mg/dL — ABNORMAL HIGH (ref 70–99)
Glucose-Capillary: 159 mg/dL — ABNORMAL HIGH (ref 70–99)
Glucose-Capillary: 121 mg/dL — ABNORMAL HIGH (ref 70–99)

## 2022-03-22 LAB — PROTIME-INR
INR: 1.2 (ref 0.8–1.2)
Prothrombin Time: 14.8 seconds (ref 11.4–15.2)

## 2022-03-22 LAB — APTT: aPTT: 34 seconds (ref 24–36)

## 2022-03-22 LAB — SURGICAL PCR SCREEN
MRSA, PCR: POSITIVE — AB
Staphylococcus aureus: POSITIVE — AB

## 2022-03-22 SURGERY — AMPUTATION, ABOVE KNEE
Anesthesia: General | Site: Knee | Laterality: Right

## 2022-03-22 MED ORDER — ONDANSETRON HCL 4 MG/2ML IJ SOLN
INTRAMUSCULAR | Status: DC | PRN
  Administered 2022-03-22: 4 mg via INTRAVENOUS

## 2022-03-22 MED ORDER — VASOPRESSIN 20 UNIT/ML IV SOLN
INTRAVENOUS | Status: DC | PRN
  Administered 2022-03-22: 1 [IU] via INTRAVENOUS
  Administered 2022-03-22: .5 [IU] via INTRAVENOUS

## 2022-03-22 MED ORDER — FENTANYL CITRATE (PF) 250 MCG/5ML IJ SOLN
INTRAMUSCULAR | Status: AC
  Filled 2022-03-22: qty 5

## 2022-03-22 MED ORDER — DEXAMETHASONE SODIUM PHOSPHATE 10 MG/ML IJ SOLN
INTRAMUSCULAR | Status: AC
  Filled 2022-03-22: qty 1

## 2022-03-22 MED ORDER — ROCURONIUM BROMIDE 100 MG/10ML IV SOLN
INTRAVENOUS | Status: DC | PRN
  Administered 2022-03-22: 50 mg via INTRAVENOUS

## 2022-03-22 MED ORDER — MUPIROCIN 2 % EX OINT
1.0000 | TOPICAL_OINTMENT | Freq: Two times a day (BID) | CUTANEOUS | Status: DC
  Administered 2022-03-22 – 2022-03-26 (×8): 1 via NASAL
  Filled 2022-03-22 (×2): qty 22

## 2022-03-22 MED ORDER — ONDANSETRON HCL 4 MG/2ML IJ SOLN
INTRAMUSCULAR | Status: AC
  Filled 2022-03-22: qty 2

## 2022-03-22 MED ORDER — SUCCINYLCHOLINE CHLORIDE 200 MG/10ML IV SOSY
PREFILLED_SYRINGE | INTRAVENOUS | Status: AC
  Filled 2022-03-22: qty 20

## 2022-03-22 MED ORDER — INSULIN ASPART 100 UNIT/ML IJ SOLN
0.0000 [IU] | INTRAMUSCULAR | Status: DC | PRN
  Filled 2022-03-22: qty 1

## 2022-03-22 MED ORDER — ATORVASTATIN CALCIUM 80 MG PO TABS
80.0000 mg | ORAL_TABLET | Freq: Every day | ORAL | Status: DC
  Administered 2022-03-22 – 2022-03-25 (×4): 80 mg via ORAL
  Filled 2022-03-22 (×4): qty 1

## 2022-03-22 MED ORDER — CHLORHEXIDINE GLUCONATE 0.12 % MT SOLN
15.0000 mL | Freq: Once | OROMUCOSAL | Status: AC
  Administered 2022-03-22: 15 mL via OROMUCOSAL
  Filled 2022-03-22: qty 15

## 2022-03-22 MED ORDER — ASPIRIN 325 MG PO TABS
325.0000 mg | ORAL_TABLET | Freq: Every morning | ORAL | Status: DC
  Administered 2022-03-23 – 2022-03-26 (×4): 325 mg via ORAL
  Filled 2022-03-22 (×4): qty 1

## 2022-03-22 MED ORDER — SUGAMMADEX SODIUM 200 MG/2ML IV SOLN
INTRAVENOUS | Status: DC | PRN
  Administered 2022-03-22: 200 mg via INTRAVENOUS

## 2022-03-22 MED ORDER — LIDOCAINE 2% (20 MG/ML) 5 ML SYRINGE
INTRAMUSCULAR | Status: DC | PRN
  Administered 2022-03-22: 100 mg via INTRAVENOUS

## 2022-03-22 MED ORDER — SODIUM CHLORIDE 0.9% FLUSH
3.0000 mL | Freq: Two times a day (BID) | INTRAVENOUS | Status: DC
  Administered 2022-03-22 – 2022-03-26 (×9): 3 mL via INTRAVENOUS

## 2022-03-22 MED ORDER — TAMSULOSIN HCL 0.4 MG PO CAPS
0.8000 mg | ORAL_CAPSULE | Freq: Every morning | ORAL | Status: DC
  Administered 2022-03-23 – 2022-03-26 (×4): 0.8 mg via ORAL
  Filled 2022-03-22 (×4): qty 2

## 2022-03-22 MED ORDER — CEFAZOLIN SODIUM-DEXTROSE 2-4 GM/100ML-% IV SOLN
2.0000 g | INTRAVENOUS | Status: AC
  Administered 2022-03-22: 2 g via INTRAVENOUS
  Filled 2022-03-22: qty 100

## 2022-03-22 MED ORDER — VASOPRESSIN 20 UNIT/ML IV SOLN
INTRAVENOUS | Status: AC
  Filled 2022-03-22: qty 1

## 2022-03-22 MED ORDER — FENTANYL CITRATE (PF) 250 MCG/5ML IJ SOLN
INTRAMUSCULAR | Status: DC | PRN
  Administered 2022-03-22: 250 ug via INTRAVENOUS

## 2022-03-22 MED ORDER — ACETAMINOPHEN 650 MG RE SUPP: 650.0000 mg | Freq: Four times a day (QID) | RECTAL | Status: DC | PRN

## 2022-03-22 MED ORDER — ACETAMINOPHEN 325 MG PO TABS
650.0000 mg | ORAL_TABLET | Freq: Four times a day (QID) | ORAL | Status: DC | PRN
  Administered 2022-03-23 – 2022-03-24 (×3): 650 mg via ORAL
  Filled 2022-03-22 (×3): qty 2

## 2022-03-22 MED ORDER — DEXAMETHASONE SODIUM PHOSPHATE 10 MG/ML IJ SOLN
INTRAMUSCULAR | Status: DC | PRN
  Administered 2022-03-22: 5 mg via INTRAVENOUS

## 2022-03-22 MED ORDER — STERILE WATER FOR IRRIGATION IR SOLN
Status: DC | PRN
  Administered 2022-03-22: 1000 mL

## 2022-03-22 MED ORDER — CHLORHEXIDINE GLUCONATE CLOTH 2 % EX PADS: 6.0000 | MEDICATED_PAD | Freq: Once | CUTANEOUS | Status: DC

## 2022-03-22 MED ORDER — OXYCODONE-ACETAMINOPHEN 5-325 MG PO TABS
1.0000 | ORAL_TABLET | Freq: Four times a day (QID) | ORAL | Status: DC | PRN
  Administered 2022-03-22 – 2022-03-25 (×6): 1 via ORAL
  Filled 2022-03-22 (×6): qty 1

## 2022-03-22 MED ORDER — ACETAMINOPHEN 500 MG PO TABS
1000.0000 mg | ORAL_TABLET | Freq: Once | ORAL | Status: AC
  Administered 2022-03-22: 1000 mg via ORAL
  Filled 2022-03-22: qty 2

## 2022-03-22 MED ORDER — CARVEDILOL 12.5 MG PO TABS
12.5000 mg | ORAL_TABLET | Freq: Every morning | ORAL | Status: DC
  Administered 2022-03-23 – 2022-03-26 (×4): 12.5 mg via ORAL
  Filled 2022-03-22 (×4): qty 1

## 2022-03-22 MED ORDER — CHLORHEXIDINE GLUCONATE CLOTH 2 % EX PADS
6.0000 | MEDICATED_PAD | Freq: Every day | CUTANEOUS | Status: DC
  Administered 2022-03-23 – 2022-03-26 (×4): 6 via TOPICAL

## 2022-03-22 MED ORDER — ORAL CARE MOUTH RINSE: 15.0000 mL | Freq: Once | OROMUCOSAL | Status: AC

## 2022-03-22 MED ORDER — KETAMINE HCL 50 MG/5ML IJ SOSY
PREFILLED_SYRINGE | INTRAMUSCULAR | Status: AC
  Filled 2022-03-22: qty 5

## 2022-03-22 MED ORDER — INSULIN ASPART 100 UNIT/ML IJ SOLN
0.0000 [IU] | INTRAMUSCULAR | Status: DC | PRN
  Administered 2022-03-22: 2 [IU] via SUBCUTANEOUS

## 2022-03-22 MED ORDER — HYDROMORPHONE HCL 1 MG/ML IJ SOLN: 0.2500 mg | INTRAMUSCULAR | Status: DC | PRN

## 2022-03-22 MED ORDER — PHENYLEPHRINE 80 MCG/ML (10ML) SYRINGE FOR IV PUSH (FOR BLOOD PRESSURE SUPPORT)
PREFILLED_SYRINGE | INTRAVENOUS | Status: AC
  Filled 2022-03-22: qty 20

## 2022-03-22 MED ORDER — DUTASTERIDE 0.5 MG PO CAPS
0.5000 mg | ORAL_CAPSULE | Freq: Every morning | ORAL | Status: DC
  Administered 2022-03-23 – 2022-03-26 (×4): 0.5 mg via ORAL
  Filled 2022-03-22 (×4): qty 1

## 2022-03-22 MED ORDER — LIDOCAINE 2% (20 MG/ML) 5 ML SYRINGE
INTRAMUSCULAR | Status: AC
  Filled 2022-03-22: qty 10

## 2022-03-22 MED ORDER — PHENYLEPHRINE 80 MCG/ML (10ML) SYRINGE FOR IV PUSH (FOR BLOOD PRESSURE SUPPORT)
PREFILLED_SYRINGE | INTRAVENOUS | Status: DC | PRN
  Administered 2022-03-22 (×2): 160 ug via INTRAVENOUS
  Administered 2022-03-22: 80 ug via INTRAVENOUS

## 2022-03-22 MED ORDER — LACTATED RINGERS IV SOLN: INTRAVENOUS | Status: DC

## 2022-03-22 MED ORDER — LACTATED RINGERS IV SOLN: INTRAVENOUS | Status: DC | PRN

## 2022-03-22 MED ORDER — PROPOFOL 10 MG/ML IV BOLUS
INTRAVENOUS | Status: DC | PRN
  Administered 2022-03-22: 90 mg via INTRAVENOUS

## 2022-03-22 MED ORDER — POLYETHYLENE GLYCOL 3350 17 G PO PACK: 17.0000 g | PACK | Freq: Every day | ORAL | Status: DC | PRN

## 2022-03-22 MED ORDER — CARVEDILOL 12.5 MG PO TABS
12.5000 mg | ORAL_TABLET | Freq: Once | ORAL | Status: AC
  Administered 2022-03-22: 12.5 mg via ORAL
  Filled 2022-03-22: qty 1

## 2022-03-22 MED ORDER — 0.9 % SODIUM CHLORIDE (POUR BTL) OPTIME
TOPICAL | Status: DC | PRN
  Administered 2022-03-22: 1000 mL

## 2022-03-22 MED ORDER — SUCCINYLCHOLINE CHLORIDE 200 MG/10ML IV SOSY
PREFILLED_SYRINGE | INTRAVENOUS | Status: DC | PRN
  Administered 2022-03-22: 120 mg via INTRAVENOUS

## 2022-03-22 MED ORDER — VANCOMYCIN HCL 1000 MG IV SOLR
INTRAVENOUS | Status: DC | PRN
  Administered 2022-03-22: 1000 mg via INTRAVENOUS

## 2022-03-22 MED ORDER — PANTOPRAZOLE SODIUM 40 MG PO TBEC
40.0000 mg | DELAYED_RELEASE_TABLET | Freq: Two times a day (BID) | ORAL | Status: DC
  Administered 2022-03-22 – 2022-03-26 (×8): 40 mg via ORAL
  Filled 2022-03-22 (×8): qty 1

## 2022-03-22 MED ORDER — POTASSIUM CHLORIDE 10 MEQ/100ML IV SOLN
10.0000 meq | INTRAVENOUS | Status: AC
  Administered 2022-03-22 (×6): 10 meq via INTRAVENOUS
  Filled 2022-03-22 (×6): qty 100

## 2022-03-22 MED ORDER — SODIUM CHLORIDE 0.9 % IV SOLN: INTRAVENOUS | Status: DC

## 2022-03-22 MED ORDER — PHENYLEPHRINE HCL-NACL 20-0.9 MG/250ML-% IV SOLN
INTRAVENOUS | Status: DC | PRN
  Administered 2022-03-22: 50 ug/min via INTRAVENOUS

## 2022-03-22 SURGICAL SUPPLY — 37 items
BAG COUNTER SPONGE SURGICOUNT (BAG) ×1 IMPLANT
BANDAGE ESMARK 6X9 LF (GAUZE/BANDAGES/DRESSINGS) ×1 IMPLANT
BLADE SAGITTAL 25.0X1.19X90 (BLADE) IMPLANT
BLADE SAW GIGLI 510 (BLADE) ×1 IMPLANT
BNDG ELASTIC 4X5.8 VLCR STR LF (GAUZE/BANDAGES/DRESSINGS) ×1 IMPLANT
BNDG ELASTIC 6X5.8 VLCR STR LF (GAUZE/BANDAGES/DRESSINGS) ×1 IMPLANT
BNDG ESMARK 6X9 LF (GAUZE/BANDAGES/DRESSINGS) ×1
BNDG GAUZE DERMACEA FLUFF 4 (GAUZE/BANDAGES/DRESSINGS) ×1 IMPLANT
CANISTER SUCT 3000ML PPV (MISCELLANEOUS) ×1 IMPLANT
CLIP LIGATING EXTRA MED SLVR (CLIP) ×1 IMPLANT
CLIP LIGATING EXTRA SM BLUE (MISCELLANEOUS) ×1 IMPLANT
COVER SURGICAL LIGHT HANDLE (MISCELLANEOUS) ×1 IMPLANT
DRAPE HALF SHEET 40X57 (DRAPES) ×1 IMPLANT
DRAPE ORTHO SPLIT 77X108 STRL (DRAPES) ×2
DRAPE SURG ORHT 6 SPLT 77X108 (DRAPES) ×2 IMPLANT
DRSG ADAPTIC 3X8 NADH LF (GAUZE/BANDAGES/DRESSINGS) ×1 IMPLANT
ELECT CAUTERY BLADE 6.4 (BLADE) ×1 IMPLANT
ELECT REM PT RETURN 9FT ADLT (ELECTROSURGICAL) ×1
ELECTRODE REM PT RTRN 9FT ADLT (ELECTROSURGICAL) ×1 IMPLANT
GAUZE SPONGE 4X4 12PLY STRL (GAUZE/BANDAGES/DRESSINGS) IMPLANT
GLOVE BIO SURGEON STRL SZ7.5 (GLOVE) ×1 IMPLANT
GOWN STRL REUS W/ TWL LRG LVL3 (GOWN DISPOSABLE) ×2 IMPLANT
GOWN STRL REUS W/ TWL XL LVL3 (GOWN DISPOSABLE) ×1 IMPLANT
GOWN STRL REUS W/TWL LRG LVL3 (GOWN DISPOSABLE) ×2
GOWN STRL REUS W/TWL XL LVL3 (GOWN DISPOSABLE) ×1
KIT BASIN OR (CUSTOM PROCEDURE TRAY) ×1 IMPLANT
KIT TURNOVER KIT B (KITS) ×1 IMPLANT
NS IRRIG 1000ML POUR BTL (IV SOLUTION) ×1 IMPLANT
PACK GENERAL/GYN (CUSTOM PROCEDURE TRAY) ×1 IMPLANT
PAD ARMBOARD 7.5X6 YLW CONV (MISCELLANEOUS) ×2 IMPLANT
STAPLER VISISTAT 35W (STAPLE) ×1 IMPLANT
STOCKINETTE IMPERVIOUS LG (DRAPES) ×1 IMPLANT
SUT SILK 0 TIES 10X30 (SUTURE) ×1 IMPLANT
SUT VIC AB 2-0 CT1 18 (SUTURE) ×2 IMPLANT
TOWEL GREEN STERILE (TOWEL DISPOSABLE) ×2 IMPLANT
UNDERPAD 30X36 HEAVY ABSORB (UNDERPADS AND DIAPERS) ×1 IMPLANT
WATER STERILE IRR 1000ML POUR (IV SOLUTION) ×1 IMPLANT

## 2022-03-22 NOTE — Progress Notes (Signed)
Dr. 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 Pt will receive Vancomycin in OR. Rise Patience CRNA  in OR also notified that pt will need Vancomycin.

## 2022-03-22 NOTE — Transfer of Care (Signed)
Immediate Anesthesia Transfer of Care Note  Patient: [facility]  Procedure(s) Performed: AMPUTATION ABOVE KNEE (Right: Knee)  Patient Location: PACU  Anesthesia Type:General  Level of Consciousness: alert   Airway & Oxygen Therapy: Patient Spontanous Breathing and Patient connected to face mask oxygen  Post-op Assessment: Report given to RN, Post -op Vital signs reviewed and stable, and Patient moving all extremities X 4  Post vital signs: Reviewed and stable  Last Vitals:  Vitals Value Taken Time  BP 132/74 03/22/22 1450  Temp    Pulse 78 03/22/22 1453  Resp 20 03/22/22 1453  SpO2 98 % 03/22/22 1453  Vitals shown include unvalidated device data.  Last Pain:  Vitals:   03/22/22 1053  TempSrc:   PainSc: 0-No pain      Patients Stated Pain Goal: 3 (03/22/22 1053)  Complications: No notable events documented.

## 2022-03-22 NOTE — H&P (Addendum)
History and Physical   Ryan Hughes FGH:[id number] DOB: 1965-09-05 DOA: 03/22/2022  PCP: Grayce Sessions, NP   Patient coming from: Home  Chief Complaint: Critical limb ischemia  HPI: Ryan Hughes is a 56 y.o. male with medical history significant of diabetes, hypertension, hyperlipidemia, BPH, stroke, PAD status post bypass and stent, anemia presenting with critical limb ischemia.  Patient with known history of PAD with prior bypass in the right lower extremity as well as stenting of the right lower extremity.  Has had ongoing limb threatening ischemia for the past month.  Has been on aspirin and Plavix and statin.  Was seen in the clinic on the eighth of this month and noted to have significant tissue loss secondary to ischemia with plan changed to right AKA due to this.  Unable to perform full ROS on admission 2/2 patient not talking.  Preoperative course: Vital signs stable. Lab work-up significant for CMP with potassium of 2.9, glucose 206, calcium 7.5, albumin 2.4.  CBC with hemoglobin stable at 9, platelets 420.  PT, PTT, INR within normal limits.  MRSA PCR screen positive.  Patient typed and screened.  Patient received Tylenol, Ancef, carvedilol, sign scale insulin perioperatively.  Patient tolerated the procedure.  Review of Systems: Unable to be performed as above  Past Medical History:  Diagnosis Date   Acute arterial ischemic stroke, vertebrobasilar, thalamic, left (HCC) 04/25/2020   Acute CVA (cerebrovascular accident) (HCC) 03/27/2021   BPH (benign prostatic hyperplasia)    Diabetes mellitus without complication (HCC)    HLD (hyperlipidemia)    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   Hypertension    Nicotine dependence, cigarettes, uncomplicated 04/25/2020   Stroke [facility])     Past Surgical History:  Procedure Laterality Date   ABDOMINAL AORTOGRAM W/LOWER EXTREMITY N/A 02/02/2022   Procedure: ABDOMINAL AORTOGRAM W/LOWER EXTREMITY;  Surgeon:  Victorino Sparrow, MD;  Location: [facility] INVASIVE CV LAB;  Service: Cardiovascular;  Laterality: N/A;   AMPUTATION Right 02/17/2022   Procedure: AMPUTATION DIGIT GREAT AND SECOND TOES;  Surgeon: Maeola Harman, MD;  Location: [facility] OR;  Service: Vascular;  Laterality: Right;   BYPASS GRAFT FEMORAL-PERONEAL Right 02/17/2022   Procedure: Right BYPASS GRAFT [place];  Surgeon: Maeola Harman, MD;  Location: [facility] OR;  Service: Vascular;  Laterality: Right;   ENDARTERECTOMY FEMORAL Right 02/17/2022   Procedure: ENDARTERECTOMY FEMORAL RIGHT;  Surgeon: Maeola Harman, MD;  Location: [facility] OR;  Service: Vascular;  Laterality: Right;   INSERTION OF ILIAC STENT  02/17/2022   Procedure: INSERTION OF PERONEAL STENT;  Surgeon: Maeola Harman, MD;  Location: [facility] OR;  Service: Vascular;;   LOWER EXTREMITY ANGIOGRAM  02/17/2022   Procedure: LOWER EXTREMITY ANGIOGRAM;  Surgeon: Maeola Harman, MD;  Location: [facility] OR;  Service: Vascular;;    Social History  reports that he quit smoking about 3 months ago. His smoking use included cigarettes. He smoked an average of .5 packs per day. He has never used smokeless tobacco. He reports current alcohol use. He reports current drug use. Drug: Marijuana.  Allergies  Allergen Reactions   Pork-Derived Products Other (See Comments)    Gout flares   Shellfish-Derived Products Other (See Comments)    Gout flares    Family History  Problem Relation Age of Onset   Stroke Mother   Reviewed on admission  Prior to Admission medications   Medication Sig Start Date End Date Taking? Authorizing Provider  acetaminophen (TYLENOL) 325 MG tablet Take 650 mg by mouth every  4 (four) hours as needed (pain).   Yes [provider]  aspirin 325 MG tablet Take 1 tablet (325 mg total) by mouth daily. Patient taking differently: Take 325 mg by mouth in the morning. 02/09/22  Yes Calvert Cantorizwan, Saima, MD  atorvastatin (LIPITOR) 80 MG  tablet Take 1 tablet (80 mg total) by mouth at bedtime. 12/10/21  Yes Grayce SessionsEdwards, Michelle P, NP  carvedilol (COREG) 12.5 MG tablet Take 1 tablet (12.5 mg total) by mouth 2 (two) times daily with a meal. Patient taking differently: Take 12.5 mg by mouth in the morning. 02/08/22  Yes Rizwan, Ladell HeadsSaima, MD  Dulaglutide (TRULICITY) 0.75 MG/0.5ML SOPN Inject 0.75 mg into the skin once a week. Patient taking differently: Inject 0.75 mg into the skin every Monday. 12/08/21  Yes Grayce SessionsEdwards, Michelle P, NP  dutasteride (AVODART) 0.5 MG capsule Take 1 capsule (0.5 mg total) by mouth daily. Patient taking differently: Take 0.5 mg by mouth in the morning. 12/20/21  Yes Marinda ElkFeliz Ortiz, Abraham, MD  insulin lispro (HUMALOG) 100 UNIT/ML injection Inject 0-15 Units into the skin See admin instructions. Inject 0-15 units twice daily per sliding scale: CBG 70-120 : 0 units CBG 121-150 : 2 units CBG 151-200 : 3 units CBG 201-250 : 5 units CBG 251-300 : 8 units CBG 301-350 : 11 units CBG 351-400 : 15 units   Yes [provider]  metFORMIN (GLUCOPHAGE) 1000 MG tablet Take 1 tablet (1,000 mg total) by mouth 2 (two) times daily with a meal. Patient taking differently: Take 1,000 mg by mouth 2 (two) times daily. 04/19/21 05/14/22 Yes Grayce SessionsEdwards, Michelle P, NP  Nutritional Supplements (RESOURCE 2.0) LIQD Take 120 mLs by mouth 2 (two) times daily.   Yes [provider]  oxyCODONE-acetaminophen (PERCOCET/ROXICET) 5-325 MG tablet Take 1 tablet by mouth every 6 (six) hours as needed for moderate pain. Patient taking differently: Take 1 tablet by mouth every 6 (six) hours as needed (pain management). 02/25/22  Yes Baglia, Corrina, PA-C  pantoprazole (PROTONIX) 40 MG tablet Take 1 tablet (40 mg total) by mouth 2 (two) times daily. 12/20/21  Yes Marinda ElkFeliz Ortiz, Abraham, MD  povidone-iodine (BETADINE) 10 % ointment Apply 1 Application topically daily. To necrotic area of right foot   Yes [provider]  senna-docusate  (SENOKOT-S) 8.6-50 MG tablet Take 1 tablet by mouth at bedtime as needed for mild constipation. Patient taking differently: Take 1 tablet by mouth at bedtime as needed (constipation). 02/08/22  Yes Calvert Cantorizwan, Saima, MD  tamsulosin (FLOMAX) 0.4 MG CAPS capsule Take 2 capsules (0.8 mg total) by mouth daily. Patient taking differently: Take 0.8 mg by mouth in the morning. 12/20/21  Yes Marinda ElkFeliz Ortiz, Abraham, MD  thiamine (VITAMIN B1) 100 MG tablet Take 1 tablet (100 mg total) by mouth daily. Patient taking differently: Take 100 mg by mouth in the morning. 02/08/22  Yes Calvert Cantorizwan, Saima, MD  clopidogrel (PLAVIX) 75 MG tablet Take 1 tablet (75 mg total) by mouth daily at 6 (six) AM. Patient not taking: Reported on 03/22/2022 02/26/22   Baglia, Corrina, PA-C  insulin aspart (NOVOLOG) 100 UNIT/ML injection Inject 0-15 Units into the skin 3 (three) times daily with meals. CBG 70 - 120: 0 units  CBG 121 - 150: 2 units  CBG 151 - 200: 3 units  CBG 201 - 250: 5 units  CBG 251 - 300: 8 units  CBG 301 - 350: 11 units  CBG 351 - 400: 15 units Patient not taking: Reported on 03/22/2022 02/08/22   Rizwan,  Saima, MD  losartan (COZAAR) 50 MG tablet Take 1 tablet (50 mg total) by mouth daily. Patient not taking: Reported on 03/22/2022 12/20/21   Marinda Elk, MD  Multiple Vitamin (MULTIVITAMIN WITH MINERALS) TABS tablet Take 1 tablet by mouth daily. Patient not taking: Reported on 03/22/2022 03/29/21   Glade Lloyd, MD    Physical Exam: Vitals:   03/22/22 1053 03/22/22 1451 03/22/22 1500 03/22/22 1515  BP: (!) 166/86 132/74 123/71 119/75  Pulse:  77 77 76  Resp:  13 (!) 24 (!) 23  Temp:  97.7 F (36.5 C)    TempSrc:      SpO2:  100% 97% 97%  Weight:      Height:        Physical Exam Constitutional:      General: He is not in acute distress.    Appearance: Normal appearance.  HENT:     Head: Normocephalic and atraumatic.     Mouth/Throat:     Mouth: Mucous membranes are moist.     Pharynx:  Oropharynx is clear.  Eyes:     Extraocular Movements: Extraocular movements intact.     Pupils: Pupils are equal, round, and reactive to light.  Cardiovascular:     Rate and Rhythm: Normal rate and regular rhythm.     Pulses: Normal pulses.     Heart sounds: Normal heart sounds.  Pulmonary:     Effort: Pulmonary effort is normal. No respiratory distress.     Breath sounds: Normal breath sounds.  Abdominal:     General: Bowel sounds are normal. There is no distension.     Palpations: Abdomen is soft.     Tenderness: There is no abdominal tenderness.  Musculoskeletal:        General: No swelling or deformity.     Comments: Status post right AKA  Skin:    General: Skin is warm and dry.  Neurological:     General: No focal deficit present.     Comments: Patient follows commands, is alert, but does not speak when asked questions.  This is apparently near his baseline but he will speak some.    Labs on Admission: I have personally reviewed following labs and imaging studies  CBC: Recent Labs  Lab 03/22/22 1119  WBC 9.0  HGB 9.0*  HCT 28.7*  MCV 89.7  PLT 420*    Basic Metabolic Panel: Recent Labs  Lab 03/22/22 1119  NA 141  K 2.9*  CL 103  CO2 25  GLUCOSE 206*  BUN 12  CREATININE 1.18  CALCIUM 7.5*    GFR: Estimated Creatinine Clearance: 70.4 mL/min (by C-G formula based on SCr of 1.18 mg/dL).  Liver Function Tests: Recent Labs  Lab 03/22/22 1119  AST 23  ALT 14  ALKPHOS 79  BILITOT 0.4  PROT 7.0  ALBUMIN 2.4*    Urine analysis:    Component Value Date/Time   COLORURINE YELLOW 12/15/2021 0032   APPEARANCEUR CLEAR 12/15/2021 0032   LABSPEC 1.023 12/15/2021 0032   PHURINE 5.0 12/15/2021 0032   GLUCOSEU >=500 (A) 12/15/2021 0032   HGBUR NEGATIVE 12/15/2021 0032   BILIRUBINUR NEGATIVE 12/15/2021 0032   BILIRUBINUR negative 12/31/2020 1548   KETONESUR 20 (A) 12/15/2021 0032   PROTEINUR 100 (A) 12/15/2021 0032   UROBILINOGEN 0.2 12/31/2020 1548    UROBILINOGEN 0.2 07/04/2010 0959   NITRITE NEGATIVE 12/15/2021 0032   LEUKOCYTESUR NEGATIVE 12/15/2021 0032    Radiological Exams on Admission: No results found.  EKG: Not performed  Assessment/Plan Principal Problem:   Critical limb ischemia of right lower extremity (HCC) Active Problems:   Mixed hyperlipidemia due to type 2 diabetes mellitus (HCC)   Benign prostatic hyperplasia (BPH) with straining on urination   History of CVA (cerebrovascular accident)   HTN (hypertension)   Type 2 diabetes mellitus with vascular disease (HCC)   Anemia of chronic disease   PAD (peripheral artery disease) (HCC)  PAD Critical limb ischemia, right lower extremity > Patient with known limb threatening ischemia for the past month or so on aspirin Plavix and statin which worsened when evaluated in clinic few days ago. > Patient scheduled for right AKA today.  Hospitalist requested to admit with vascular surgery consulting. > Status post right AKA - Monitor on telemetry surgical telemetry unit - Pain control as needed - Atorvastatin and ASA   Hypokalemia > Potassium 2.9 initial evaluation. - Check magnesium - Supplement potassium  Diabetes - SSI  Anemia > Hemoglobin stable at 9  - Trend CBC  Hypertension - Continue home carvedilol  BPH - Continue home due to dutasteride and tamsulosin\ - Chronic Foley exchanged in OR  History of CVA > Nonverbal in PACU, reportedly he is minimally verbal at baseline. Otherwise is following command (squeezes hand when asked). - Continue home atorvastatin and aspirin   DVT prophylaxis: SCDs Code Status:   Full Family Communication:  None, vascular surgery has discussed the case with his family.  Disposition Plan:   Patient is from:  Home  Anticipated DC to:  Home  Anticipated DC date:  1 to 3 days  Anticipated DC barriers: None  Consults called:  Vascular surgery consulting and have performed amputation Admission status:  Observation,  telemetry  Severity of Illness: The appropriate patient status for this patient is OBSERVATION. Observation status is judged to be reasonable and necessary in order to provide the required intensity of service to ensure the patient's safety. The patient's presenting symptoms, physical exam findings, and initial radiographic and laboratory data in the context of their medical condition is felt to place them at decreased risk for further clinical deterioration. Furthermore, it is anticipated that the patient will be medically stable for discharge from the hospital within 2 midnights of admission.    Synetta Fail MD [facility]  How to contact the [facility] Attending or Consulting provider 7A - 7P or covering provider during after hours 7P -7A, for this patient?   Check the care team in [facility] and look for a) attending/consulting [facility] provider listed and b) the [facility] team listed Log into [email or web address] and use [facility] universal password to access. If you do not have the password, please contact the hospital operator. Locate the [facility] provider you are looking for under [facility] and page to a number that you can be directly reached. If you still have difficulty reaching the provider, please page the [facility] (Director on Call) for the Hospitalists listed on amion for assistance.  03/22/2022, 3:21 PM

## 2022-03-22 NOTE — Progress Notes (Signed)
Pt arrived to pre-op from [facility]. Per niece, she can't remember how long current indwelling foley catheter has been in. Dr. Randie Heinz made aware, will change out in OR.   Dr. Krista Blue made aware that pt had GLP-1 Trulicity yesterday at Facility.

## 2022-03-22 NOTE — Anesthesia Postprocedure Evaluation (Signed)
Anesthesia Post Note  Patient: Ryan Hughes  Procedure(s) Performed: AMPUTATION ABOVE KNEE (Right: Knee)     Patient location during evaluation: PACU Anesthesia Type: General Level of consciousness: sedated Pain management: pain level controlled Vital Signs Assessment: post-procedure vital signs reviewed and stable Respiratory status: spontaneous breathing and respiratory function stable Cardiovascular status: stable Postop Assessment: no apparent nausea or vomiting Anesthetic complications: no   No notable events documented.  Last Vitals:  Vitals:   03/22/22 1645 03/22/22 1702  BP: 119/60 122/72  Pulse: 75 75  Resp: 19 16  Temp:  37.1 C  SpO2: 96% 98%    Last Pain:  Vitals:   03/22/22 1702  TempSrc: Oral  PainSc:                  Summerlynn Glauser DANIEL

## 2022-03-22 NOTE — Anesthesia Preprocedure Evaluation (Addendum)
Anesthesia Evaluation  Patient identified by MRN, date of birth, ID band Patient unresponsive    Reviewed: Allergy & Precautions, NPO status , Patient's Chart, lab work & pertinent test results, reviewed documented beta blocker date and time   History of Anesthesia Complications Negative for: history of anesthetic complications  Airway Mallampati: II  TM Distance: >3 FB Neck ROM: Full    Dental  (+) Dental Advisory Given, Poor Dentition   Pulmonary former smoker    + decreased breath sounds      Cardiovascular hypertension, Pt. on home beta blockers and Pt. on medications Normal cardiovascular exam  Echo 2022 IMPRESSIONS     1. Left ventricular ejection fraction, by estimation, is 70 to 75%. The  left ventricle has hyperdynamic function. The left ventricle has no  regional wall motion abnormalities. There is moderate left ventricular  hypertrophy. Left ventricular diastolic  parameters are consistent with Grade I diastolic dysfunction (impaired  relaxation).   2. Right ventricular systolic function is normal. The right ventricular  size is normal.   3. The mitral valve is normal in structure. No evidence of mitral valve  regurgitation. No evidence of mitral stenosis.   4. The aortic valve is tricuspid. Aortic valve regurgitation is not  visualized. No aortic stenosis is present.   5. The inferior vena cava is normal in size with greater than 50%  respiratory variability, suggesting right atrial pressure of 3 mmHg.      Neuro/Psych       Dementia CVA, Residual Symptoms    GI/Hepatic negative GI ROS, Neg liver ROS,,,  Endo/Other  diabetes    Renal/GU negative Renal ROS     Musculoskeletal negative musculoskeletal ROS (+)    Abdominal   Peds  Hematology negative hematology ROS (+)   Anesthesia Other Findings   Reproductive/Obstetrics                             Anesthesia  Physical Anesthesia Plan  ASA: 3  Anesthesia Plan: General   Post-op Pain Management: Toradol IV (intra-op)*, Tylenol PO (pre-op)* and Ketamine IV*   Induction: Intravenous, Rapid sequence and Cricoid pressure planned  PONV Risk Score and Plan: 3 and Ondansetron, Dexamethasone and Midazolam  Airway Management Planned: Oral ETT  Additional Equipment:   Intra-op Plan:   Post-operative Plan: Extubation in OR  Informed Consent: I have reviewed the patients History and Physical, chart, labs and discussed the procedure including the risks, benefits and alternatives for the proposed anesthesia with the patient or authorized representative who has indicated his/her understanding and acceptance.     Dental advisory given and Consent reviewed with POA  Plan Discussed with: Anesthesiologist, CRNA and Surgeon  Anesthesia Plan Comments:        Anesthesia Quick Evaluation

## 2022-03-22 NOTE — Interval H&P Note (Signed)
History and Physical Interval Note:  03/22/2022 10:08 AM  Ryan Hughes  has presented today for surgery, with the diagnosis of Critical limb ischemia of right lower extremity.  The various methods of treatment have been discussed with the patient and family. After consideration of risks, benefits and other options for treatment, the patient has consented to  Procedure(s): AMPUTATION ABOVE KNEE (Right) as a surgical intervention.  The patient's history has been reviewed, patient examined, no change in status, stable for surgery.  I have reviewed the patient's chart and labs.  Questions were answered to the patient's satisfaction.     Lemar Livings

## 2022-03-22 NOTE — Op Note (Signed)
    Patient name: Ryan Hughes MRN: [id number] DOB: 1966-01-21 Sex: male  03/22/2022 Pre-operative Diagnosis: Necrotic right foot status post right lower extremity bypass Post-operative diagnosis:  Same Surgeon:  Luanna Salk. 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  Findings: All tissue appeared healthy.  The bypass was patent and was ligated.  Anterior posterior flaps were reapproximated without tension.   Procedure:  The patient was identified in the holding area and taken to the operating where is placed supine operative table and general anesthesia was induced.  He was sterilely prepped and draped in the right lower extremity usual fashion, antibiotics were minister timeout was called.  A fishmouth type incision was made around the level of the knee dissected down to the level of the bone with cautery.  The bone was transected with Gigli saw.  Posterior flap was created with amputation knife.  The vessels were suture-ligated with 2-0 Vicryl stick tie and the nerve was pulled on tension and tied off with 2-0 Vicryl tie and divided.  The bone was smoothed with rasp.  The wound was thoroughly irrigated and hemostasis was obtained.  Flaps were reapproximated with interrupted 2-0 Vicryl suture and the skin was closed with staples.  Sterile dressing was applied.  Patient was awakened from anesthesia having tolerated procedure without any complication.  All counts were correct at completion.  EBL: 100 cc    Josean Lycan C. Randie Heinz, MD [facility] [place] Office: [phone] Pager: [phone]

## 2022-03-22 NOTE — Anesthesia Procedure Notes (Signed)
Procedure Name: Intubation Date/Time: 03/22/2022 1:24 PM  Performed by: Heide Scales, CRNAPre-anesthesia Checklist: Patient identified, Emergency Drugs available, Suction available and Patient being monitored Patient Re-evaluated:Patient Re-evaluated prior to induction Oxygen Delivery Method: Circle system utilized Preoxygenation: Pre-oxygenation with 100% oxygen Induction Type: IV induction, Rapid sequence and Cricoid Pressure applied Laryngoscope Size: Mac and 4 Grade View: Grade I Tube type: Oral Tube size: 7.5 mm Number of attempts: 1 Airway Equipment and Method: Stylet and Oral airway Placement Confirmation: ETT inserted through vocal cords under direct vision, positive ETCO2 and breath sounds checked- equal and bilateral Secured at: 22 cm Tube secured with: Tape Dental Injury: Teeth and Oropharynx as per pre-operative assessment

## 2022-03-22 NOTE — Progress Notes (Signed)
Dr. Krista Blue made aware of Potassium 2.9, no new orders at this time.

## 2022-03-23 ENCOUNTER — Encounter (HOSPITAL_COMMUNITY): Payer: Self-pay | Admitting: Vascular Surgery

## 2022-03-23 DIAGNOSIS — D72829 Elevated white blood cell count, unspecified: Secondary | ICD-10-CM | POA: Diagnosis present

## 2022-03-23 DIAGNOSIS — I96 Gangrene, not elsewhere classified: Secondary | ICD-10-CM | POA: Diagnosis present

## 2022-03-23 DIAGNOSIS — E1152 Type 2 diabetes mellitus with diabetic peripheral angiopathy with gangrene: Secondary | ICD-10-CM | POA: Diagnosis present

## 2022-03-23 DIAGNOSIS — I10 Essential (primary) hypertension: Secondary | ICD-10-CM | POA: Diagnosis present

## 2022-03-23 DIAGNOSIS — Z7982 Long term (current) use of aspirin: Secondary | ICD-10-CM | POA: Diagnosis not present

## 2022-03-23 DIAGNOSIS — Z79899 Other long term (current) drug therapy: Secondary | ICD-10-CM | POA: Diagnosis not present

## 2022-03-23 DIAGNOSIS — D62 Acute posthemorrhagic anemia: Secondary | ICD-10-CM | POA: Insufficient documentation

## 2022-03-23 DIAGNOSIS — Z7984 Long term (current) use of oral hypoglycemic drugs: Secondary | ICD-10-CM | POA: Diagnosis not present

## 2022-03-23 DIAGNOSIS — E785 Hyperlipidemia, unspecified: Secondary | ICD-10-CM | POA: Diagnosis present

## 2022-03-23 DIAGNOSIS — I70222 Atherosclerosis of native arteries of extremities with rest pain, left leg: Secondary | ICD-10-CM | POA: Diagnosis present

## 2022-03-23 DIAGNOSIS — I70221 Atherosclerosis of native arteries of extremities with rest pain, right leg: Secondary | ICD-10-CM | POA: Diagnosis not present

## 2022-03-23 DIAGNOSIS — Z91014 Allergy to mammalian meats: Secondary | ICD-10-CM | POA: Diagnosis not present

## 2022-03-23 DIAGNOSIS — Z91013 Allergy to seafood: Secondary | ICD-10-CM | POA: Diagnosis not present

## 2022-03-23 DIAGNOSIS — D638 Anemia in other chronic diseases classified elsewhere: Secondary | ICD-10-CM | POA: Diagnosis present

## 2022-03-23 DIAGNOSIS — Z7985 Long-term (current) use of injectable non-insulin antidiabetic drugs: Secondary | ICD-10-CM | POA: Diagnosis not present

## 2022-03-23 DIAGNOSIS — R3916 Straining to void: Secondary | ICD-10-CM | POA: Diagnosis present

## 2022-03-23 DIAGNOSIS — Z87891 Personal history of nicotine dependence: Secondary | ICD-10-CM | POA: Diagnosis not present

## 2022-03-23 DIAGNOSIS — Z794 Long term (current) use of insulin: Secondary | ICD-10-CM | POA: Diagnosis not present

## 2022-03-23 DIAGNOSIS — E1151 Type 2 diabetes mellitus with diabetic peripheral angiopathy without gangrene: Secondary | ICD-10-CM | POA: Diagnosis present

## 2022-03-23 DIAGNOSIS — Z8673 Personal history of transient ischemic attack (TIA), and cerebral infarction without residual deficits: Secondary | ICD-10-CM | POA: Diagnosis not present

## 2022-03-23 DIAGNOSIS — E876 Hypokalemia: Secondary | ICD-10-CM | POA: Diagnosis present

## 2022-03-23 DIAGNOSIS — N401 Enlarged prostate with lower urinary tract symptoms: Secondary | ICD-10-CM | POA: Diagnosis present

## 2022-03-23 DIAGNOSIS — F1021 Alcohol dependence, in remission: Secondary | ICD-10-CM | POA: Diagnosis present

## 2022-03-23 DIAGNOSIS — I739 Peripheral vascular disease, unspecified: Secondary | ICD-10-CM | POA: Diagnosis not present

## 2022-03-23 DIAGNOSIS — E782 Mixed hyperlipidemia: Secondary | ICD-10-CM | POA: Diagnosis present

## 2022-03-23 DIAGNOSIS — E1169 Type 2 diabetes mellitus with other specified complication: Secondary | ICD-10-CM | POA: Diagnosis present

## 2022-03-23 LAB — COMPREHENSIVE METABOLIC PANEL
ALT: 11 U/L (ref 0–44)
AST: 19 U/L (ref 15–41)
Albumin: 1.8 g/dL — ABNORMAL LOW (ref 3.5–5.0)
Alkaline Phosphatase: 62 U/L (ref 38–126)
Anion gap: 9 (ref 5–15)
BUN: 16 mg/dL (ref 6–20)
CO2: 24 mmol/L (ref 22–32)
Calcium: 6.3 mg/dL — CL (ref 8.9–10.3)
Chloride: 105 mmol/L (ref 98–111)
Creatinine, Ser: 1.06 mg/dL (ref 0.61–1.24)
GFR, Estimated: >60 mL/min (ref >60)
Glucose, Bld: 111 mg/dL — ABNORMAL HIGH (ref 70–99)
Potassium: 3.8 mmol/L (ref 3.5–5.1)
Sodium: 138 mmol/L (ref 135–145)
Total Bilirubin: 0.4 mg/dL (ref 0.3–1.2)
Total Protein: 5.6 g/dL — ABNORMAL LOW (ref 6.5–8.1)

## 2022-03-23 LAB — CBC
HCT: 21.2 % — ABNORMAL LOW (ref 39.0–52.0)
HCT: 29 % — ABNORMAL LOW (ref 39.0–52.0)
Hemoglobin: 7 g/dL — ABNORMAL LOW (ref 13.0–17.0)
Hemoglobin: 9.9 g/dL — ABNORMAL LOW (ref 13.0–17.0)
MCH: 28.1 pg (ref 26.0–34.0)
MCH: 28.9 pg (ref 26.0–34.0)
MCHC: 33 g/dL (ref 30.0–36.0)
MCHC: 34.1 g/dL (ref 30.0–36.0)
MCV: 85.1 fL (ref 80.0–100.0)
MCV: 84.5 fL (ref 80.0–100.0)
Platelets: 334 10*3/uL (ref 150–400)
Platelets: 313 10*3/uL (ref 150–400)
RBC: 2.49 MIL/uL — ABNORMAL LOW (ref 4.22–5.81)
RBC: 3.43 MIL/uL — ABNORMAL LOW (ref 4.22–5.81)
RDW: 14.9 % (ref 11.5–15.5)
RDW: 14.7 % (ref 11.5–15.5)
WBC: 10.6 10*3/uL — ABNORMAL HIGH (ref 4.0–10.5)
WBC: 13.6 10*3/uL — ABNORMAL HIGH (ref 4.0–10.5)
nRBC: 0 % (ref 0.0–0.2)
nRBC: 0 % (ref 0.0–0.2)

## 2022-03-23 LAB — FOLATE: Folate: 5.3 ng/mL — ABNORMAL LOW (ref >5.9)

## 2022-03-23 LAB — IRON AND TIBC
Iron: 17 ug/dL — ABNORMAL LOW (ref 45–182)
Saturation Ratios: 13 % — ABNORMAL LOW (ref 17.9–39.5)
TIBC: 132 ug/dL — ABNORMAL LOW (ref 250–450)
UIBC: 115 ug/dL

## 2022-03-23 LAB — GLUCOSE, CAPILLARY
Glucose-Capillary: 111 mg/dL — ABNORMAL HIGH (ref 70–99)
Glucose-Capillary: 114 mg/dL — ABNORMAL HIGH (ref 70–99)
Glucose-Capillary: 128 mg/dL — ABNORMAL HIGH (ref 70–99)
Glucose-Capillary: 173 mg/dL — ABNORMAL HIGH (ref 70–99)
Glucose-Capillary: 185 mg/dL — ABNORMAL HIGH (ref 70–99)

## 2022-03-23 LAB — PREPARE RBC (CROSSMATCH)

## 2022-03-23 LAB — RETICULOCYTES
Immature Retic Fract: 16.2 % — ABNORMAL HIGH (ref 2.3–15.9)
RBC.: 2.57 MIL/uL — ABNORMAL LOW (ref 4.22–5.81)
Retic Count, Absolute: 36 10*3/uL (ref 19.0–186.0)
Retic Ct Pct: 1.4 % (ref 0.4–3.1)

## 2022-03-23 LAB — VITAMIN B12: Vitamin B-12: 280 pg/mL (ref 180–914)

## 2022-03-23 LAB — FERRITIN: Ferritin: 3135 ng/mL — ABNORMAL HIGH (ref 24–336)

## 2022-03-23 LAB — HEMOGLOBIN A1C
Hgb A1c MFr Bld: 6.1 % — ABNORMAL HIGH (ref 4.8–5.6)
Mean Plasma Glucose: 128.37 mg/dL

## 2022-03-23 MED ORDER — SODIUM CHLORIDE 0.9% IV SOLUTION: Freq: Once | INTRAVENOUS | Status: AC

## 2022-03-23 MED ORDER — INSULIN ASPART 100 UNIT/ML IJ SOLN
0.0000 [IU] | Freq: Three times a day (TID) | INTRAMUSCULAR | Status: DC
  Administered 2022-03-23 – 2022-03-24 (×2): 3 [IU] via SUBCUTANEOUS
  Administered 2022-03-24 – 2022-03-25 (×3): 2 [IU] via SUBCUTANEOUS

## 2022-03-23 MED ORDER — CALCIUM GLUCONATE-NACL 2-0.675 GM/100ML-% IV SOLN
2.0000 g | Freq: Once | INTRAVENOUS | Status: AC
  Administered 2022-03-23: 2000 mg via INTRAVENOUS
  Filled 2022-03-23: qty 100

## 2022-03-23 MED ORDER — MORPHINE SULFATE (PF) 4 MG/ML IV SOLN: 4.0000 mg | INTRAVENOUS | Status: DC | PRN

## 2022-03-23 MED ORDER — SENNOSIDES-DOCUSATE SODIUM 8.6-50 MG PO TABS: 1.0000 | ORAL_TABLET | Freq: Every evening | ORAL | Status: DC | PRN

## 2022-03-23 MED ORDER — POLYETHYLENE GLYCOL 3350 17 G PO PACK
17.0000 g | PACK | Freq: Two times a day (BID) | ORAL | Status: AC
  Administered 2022-03-23 – 2022-03-24 (×4): 17 g via ORAL
  Filled 2022-03-23 (×4): qty 1

## 2022-03-23 MED ORDER — INSULIN ASPART 100 UNIT/ML IJ SOLN: 0.0000 [IU] | Freq: Every day | INTRAMUSCULAR | Status: DC

## 2022-03-23 MED ORDER — INSULIN ASPART 100 UNIT/ML IJ SOLN
4.0000 [IU] | Freq: Three times a day (TID) | INTRAMUSCULAR | Status: DC
  Administered 2022-03-23 – 2022-03-25 (×7): 4 [IU] via SUBCUTANEOUS

## 2022-03-23 NOTE — Progress Notes (Signed)
MEW's score yellow, febrile otherwise other vitals stable. NP on call notified. PRN tylenol given and ice pack notified.

## 2022-03-23 NOTE — Progress Notes (Addendum)
[facility] [place]  Subjective  - Non verbal   Objective (!) 153/82 86 99.6 F (37.6 C) (Oral) 16 98%  Intake/Output Summary (Last 24 hours) at 03/23/2022 0715 Last data filed at 03/23/2022 0300 Gross per 24 hour  Intake 1894.64 ml  Output 50 ml  Net 1844.64 ml    Right AKA dressing intact, RN states the dressing came off and was replaced over night  Assessment/Planning: POD # 1 right AKA  We will change dressing again tomorrow.   Mosetta Pigeon 03/23/2022 7:15 AM --  Laboratory Lab Results: Recent Labs    03/22/22 1838 03/23/22 0349  WBC 9.9 10.6*  HGB 7.7* 7.0*  HCT 23.5* 21.2*  PLT 333 334   BMET Recent Labs    03/22/22 1119 03/23/22 0349  NA 141 138  K 2.9* 3.8  CL 103 105  CO2 25 24  GLUCOSE 206* 111*  BUN 12 16  CREATININE 1.18 1.06  CALCIUM 7.5* 6.3*    COAG Lab Results  Component Value Date   INR 1.2 03/22/2022   INR 1.0 02/17/2022   INR 1.0 02/01/2022   No results found for: "PTT"   I have independently interviewed and examined patient and agree with PA assessment and plan above.   Adrin Julian C. Randie Heinz, MD [facility] [place] Office: [phone] Pager: [phone]

## 2022-03-23 NOTE — Progress Notes (Signed)
[facility] PROGRESS NOTE    Progress Note  Ryan Hughes  NIO:[id number] DOB: [birth year] DOA: 03/22/2022 PCP: Grayce Sessions, NP     Brief Narrative:   Ryan Hughes is an 56 y.o. 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 Resume aspirin. Vascular to dictate when to restart Plavix. Narcotics and anticoagulation per vascular. Started on bowel regimen.  Acute blood loss anemia/normocytic anemia: Requiring transfusion 2 units packed red blood cells to get CBC posttransfusion. Check an anemia panel although will be unreliable due to recent surgical intervention. No signs of overt bleeding. We will need to follow-up with PCP as an outpatient. Hypokalemia: Replete orally now resolved.  Leukocytosis: Likely reactive due to recent amputation.  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  Hold metformin. CBGs before meals and at bedtime.  Mixed hyperlipidemia due to type 2 diabetes mellitus (HCC) Statins.  Normocytic anemia: Hemoglobin is 7 we will go ahead and transfuse 1 unit of packed red blood cells.  Essential hypertension: Resume Coreg.  Blood pressure is stable. We will continue to hold ARB.  BPH: Continue Flomax and dutasteride. Foley was exchanged in the OR.  History of CVA: Reportedly he is minimally verbal at baseline. Continue aspirin and statins.  DVT prophylaxis: lovenox Family Communication:none Status is: Observation The patient will require care spanning > 2 midnights  and should be moved to inpatient because: Admit to inpatient for right AKA    Code Status:     Code Status Orders  (From admission, onward)           Start     Ordered   03/22/22 1520  Full code  Continuous        03/22/22 1519           Code Status History     Date Active Date Inactive Code Status Order ID Comments User Context   02/17/2022 1642 02/26/2022 0150 Full Code [id number]  Ernestene Mention, PA-C Inpatient   01/31/2022 1745 02/08/2022 2246 Full Code [id number]  Glade Lloyd, MD ED   12/15/2021 0025 12/22/2021 1843 Full Code [id number]  Eduard Clos, MD ED   03/27/2021 2050 03/28/2021 2355 Full Code [id number]  Eduard Clos, MD ED   08/05/2020 2120 08/07/2020 1810 Full Code [id number]  Hillary Bow, DO ED   04/25/2020 0234 04/27/2020 2211 Full Code [id number]  Shalhoub, Deno Lunger, MD ED      Advance Directive Documentation    Flowsheet Row Most Recent Value  Type of Advance Directive Healthcare Power of Attorney  Pre-existing out of facility DNR order (yellow form or pink MOST form) --  "MOST" Form in Place? --         IV Access:   Peripheral IV   Procedures and diagnostic studies:   No results found.   Medical Consultants:   None.   Subjective:    Ryan Hughes nonverbal has no pain  Objective:    Vitals:   03/22/22 1702 03/22/22 2128 03/23/22 0042 03/23/22 0538  BP: 122/72 136/84 138/89 (!) 153/82  Pulse: 75 79 82  86  Resp: 16 20 20 16   Temp: 98.8 F (37.1 C) 98.4 F (36.9 C) 98.5 F (36.9 C) 99.6 F (37.6 C)  TempSrc: Oral Oral Oral Oral  SpO2: 98% 100% 99% 98%  Weight:      Height:       SpO2: 98 %   Intake/Output Summary (Last 24 hours) at 03/23/2022 0734 Last data filed at 03/23/2022 0538 Gross per 24 hour  Intake 1894.64 ml  Output 275 ml  Net 1619.64 ml   Filed Weights   03/21/22 1021 03/22/22 1033  Weight: 81.2 kg 81.2 kg    Exam: General exam: In no acute distress, minimally  verbal Respiratory system: Good air movement and clear to auscultation. Cardiovascular system: S1 & S2 heard, RRR. No JVD. Gastrointestinal system: Abdomen is nondistended, soft and nontender.  Extremities: Right upper the knee amputation. Skin: No rashes, lesions or ulcers   Data Reviewed:    Labs: Basic Metabolic Panel: Recent Labs  Lab 03/22/22 1119 03/23/22 0349  NA 141 138  K 2.9* 3.8  CL 103 105  CO2 25 24  GLUCOSE 206* 111*  BUN 12 16  CREATININE 1.18 1.06  CALCIUM 7.5* 6.3*   GFR Estimated Creatinine Clearance: 78.4 mL/min (by C-G formula based on SCr of 1.06 mg/dL). Liver Function Tests: Recent Labs  Lab 03/22/22 1119 03/23/22 0349  AST 23 19  ALT 14 11  ALKPHOS 79 62  BILITOT 0.4 0.4  PROT 7.0 5.6*  ALBUMIN 2.4* 1.8*   No results for input(s): "LIPASE", "AMYLASE" in the last 168 hours. No results for input(s): "AMMONIA" in the last 168 hours. Coagulation profile Recent Labs  Lab 03/22/22 1119  INR 1.2   COVID-19 Labs  No results for input(s): "DDIMER", "FERRITIN", "LDH", "CRP" in the last 72 hours.  Lab Results  Component Value Date   SARSCOV2NAA NEGATIVE 03/27/2021   SARSCOV2NAA NEGATIVE 08/05/2020   SARSCOV2NAA NEGATIVE 04/24/2020    CBC: Recent Labs  Lab 03/22/22 1119 03/22/22 1838 03/23/22 0349  WBC 9.0 9.9 10.6*  HGB 9.0* 7.7* 7.0*  HCT 28.7* 23.5* 21.2*  MCV 89.7 86.7 85.1  PLT 420* 333 334   Cardiac Enzymes: No results for input(s): "CKTOTAL", "CKMB", "CKMBINDEX", "TROPONINI" in the last 168 hours. BNP (last 3 results) No results for input(s): "PROBNP" in the last 8760 hours. CBG: Recent Labs  Lab 03/22/22 1259 03/22/22 1451 03/22/22 1706 03/23/22 0053 03/23/22 0607  GLUCAP 153* 159* 121* 128* 111*   D-Dimer: No results for input(s): "DDIMER" in the last 72 hours. Hgb A1c: No results for input(s): "HGBA1C" in the last 72 hours. Lipid Profile: No results for input(s): "CHOL", "HDL", "LDLCALC", "TRIG", "CHOLHDL",  "LDLDIRECT" in the last 72 hours. Thyroid function studies: No results for input(s): "TSH", "T4TOTAL", "T3FREE", "THYROIDAB" in the last 72 hours.  Invalid input(s): "FREET3" Anemia work up: No results for input(s): "VITAMINB12", "FOLATE", "FERRITIN", "TIBC", "IRON", "RETICCTPCT" in the last 72 hours. Sepsis Labs: Recent Labs  Lab 03/22/22 1119 03/22/22 1838 03/23/22 0349  WBC 9.0 9.9 10.6*   Microbiology Recent Results (from the past 240 hour(s))  Surgical pcr screen     Status: Abnormal   Collection Time: 03/22/22 10:20 AM   Specimen: Nasal Mucosa; Nasal Swab  Result Value Ref Range Status   MRSA, PCR POSITIVE (A) NEGATIVE Final    Comment: RESULT CALLED TO, READ BACK BY AND VERIFIED WITH: RN NANCY IRISH 03/22/22 @ 13:12 BY EM    Staphylococcus aureus POSITIVE (A) NEGATIVE Final  Comment: (NOTE) The Xpert SA Assay (FDA approved for NASAL specimens in patients 67 years of age and older), is one component of a comprehensive surveillance program. It is not intended to diagnose infection nor to guide or monitor treatment. Performed at [facility], [address] [place], [place] [address]      Medications:    aspirin  325 mg Oral q AM   atorvastatin  80 mg Oral QHS   carvedilol  12.5 mg Oral q AM   Chlorhexidine Gluconate Cloth  6 each Topical Once   And   Chlorhexidine Gluconate Cloth  6 each Topical Once   Chlorhexidine Gluconate Cloth  6 each Topical Q0600   dutasteride  0.5 mg Oral q AM   mupirocin ointment  1 Application Nasal BID   pantoprazole  40 mg Oral BID   sodium chloride flush  3 mL Intravenous Q12H   tamsulosin  0.8 mg Oral q AM   Continuous Infusions:  sodium chloride 10 mL/hr at 03/22/22 1813   lactated ringers Stopped (03/22/22 1439)      LOS: 1 day   Marinda Elk  [facility]  03/23/2022, 7:34 AM

## 2022-03-23 NOTE — TOC Initial Note (Signed)
Transition of Care [facility]) - Initial/Assessment Note    Patient Details  Name: Ryan Hughes MRN: [id number] Date of Birth: [birth year]  Transition of Care [facility]) CM/SW Contact:    Lawerance Sabal, RN Phone Number: 03/23/2022, 8:16 AM  Clinical Narrative:                  Patient resides at [facility]  [facility] will continue to follow to assist w DC planning back to SNF   Expected Discharge Plan: Skilled Nursing Facility Barriers to Discharge: Continued Medical Work up   Patient Goals and CMS Choice        Expected Discharge Plan and Services Expected Discharge Plan: Skilled Nursing Facility                                              Prior Living Arrangements/Services                       Activities of Daily Living Home Assistive Devices/Equipment: Environmental consultant (specify type) ADL Screening (condition at time of admission) Patient's cognitive ability adequate to safely complete daily activities?: No Is the patient deaf or have difficulty hearing?: No Does the patient have difficulty seeing, even when wearing glasses/contacts?: No Does the patient have difficulty concentrating, remembering, or making decisions?: Yes Patient able to express need for assistance with ADLs?: Yes Does the patient have difficulty dressing or bathing?: Yes Independently performs ADLs?: No Does the patient have difficulty walking or climbing stairs?: Yes Weakness of Legs: Both  Permission Sought/Granted                  Emotional Assessment              Admission diagnosis:  Critical limb ischemia of left lower extremity (HCC) [I70.222] Patient Active Problem List   Diagnosis Date Noted   Acute postoperative anemia due to expected blood loss 03/23/2022   Critical limb ischemia of right lower extremity (HCC) 03/22/2022   PAD (peripheral artery disease) (HCC) 02/17/2022   Hypomagnesemia 02/01/2022   Anemia of chronic disease 02/01/2022   Gangrene (HCC)  01/31/2022   Hypokalemia 12/16/2021   Nausea & vomiting 12/15/2021   Type 2 diabetes mellitus with vascular disease (HCC) 03/27/2021   Intractable nausea and vomiting 08/05/2020   HTN (hypertension) 08/05/2020   Mixed hyperlipidemia due to type 2 diabetes mellitus (HCC) 04/25/2020   Benign prostatic hyperplasia (BPH) with straining on urination 04/25/2020   Nicotine dependence, cigarettes, uncomplicated 04/25/2020   History of CVA (cerebrovascular accident) 04/25/2020   Rash 03/19/2013   Urinary hesitancy 03/19/2013   Back spasm 05/16/2012   Foot pain 07/18/2011   Tingling in extremities 06/18/2011   PCP:  Grayce Sessions, NP Pharmacy:   [id number] - [place], [place] - [address] [place] [address] Phone: [phone] Fax: [phone]  [facility] [place] [facility] [place], [place] - [address] [place] [address] Phone: [phone] Fax: [phone]     Social Determinants of Health (SDOH) Interventions    Readmission Risk Interventions     No data to display

## 2022-03-24 DIAGNOSIS — D62 Acute posthemorrhagic anemia: Secondary | ICD-10-CM | POA: Diagnosis not present

## 2022-03-24 DIAGNOSIS — I70221 Atherosclerosis of native arteries of extremities with rest pain, right leg: Secondary | ICD-10-CM | POA: Diagnosis not present

## 2022-03-24 DIAGNOSIS — D638 Anemia in other chronic diseases classified elsewhere: Secondary | ICD-10-CM | POA: Diagnosis not present

## 2022-03-24 DIAGNOSIS — E1169 Type 2 diabetes mellitus with other specified complication: Secondary | ICD-10-CM | POA: Diagnosis not present

## 2022-03-24 LAB — TYPE AND SCREEN
ABO/RH(D): AB POS
Antibody Screen: NEGATIVE
Unit division: 0
Unit division: 0

## 2022-03-24 LAB — CBC
HCT: 30.3 % — ABNORMAL LOW (ref 39.0–52.0)
Hemoglobin: 10.1 g/dL — ABNORMAL LOW (ref 13.0–17.0)
MCH: 28.8 pg (ref 26.0–34.0)
MCHC: 33.3 g/dL (ref 30.0–36.0)
MCV: 86.3 fL (ref 80.0–100.0)
Platelets: 318 10*3/uL (ref 150–400)
RBC: 3.51 MIL/uL — ABNORMAL LOW (ref 4.22–5.81)
RDW: 14.9 % (ref 11.5–15.5)
WBC: 11.9 10*3/uL — ABNORMAL HIGH (ref 4.0–10.5)
nRBC: 0 % (ref 0.0–0.2)

## 2022-03-24 LAB — GLUCOSE, CAPILLARY
Glucose-Capillary: 122 mg/dL — ABNORMAL HIGH (ref 70–99)
Glucose-Capillary: 142 mg/dL — ABNORMAL HIGH (ref 70–99)
Glucose-Capillary: 187 mg/dL — ABNORMAL HIGH (ref 70–99)
Glucose-Capillary: 151 mg/dL — ABNORMAL HIGH (ref 70–99)

## 2022-03-24 LAB — URINALYSIS, ROUTINE W REFLEX MICROSCOPIC
Bilirubin Urine: NEGATIVE
Glucose, UA: NEGATIVE mg/dL
Ketones, ur: NEGATIVE mg/dL
Nitrite: NEGATIVE
Protein, ur: 30 mg/dL — AB
Specific Gravity, Urine: 1.019 (ref 1.005–1.030)
WBC, UA: >50 WBC/hpf — ABNORMAL HIGH (ref 0–5)
pH: 5 (ref 5.0–8.0)

## 2022-03-24 LAB — BPAM RBC
Blood Product Expiration Date: 202312122359
Blood Product Expiration Date: 202312132359
ISSUE DATE / TIME: 202311151016
ISSUE DATE / TIME: 202311151316
Unit Type and Rh: 8400
Unit Type and Rh: 8400

## 2022-03-24 LAB — BASIC METABOLIC PANEL
Anion gap: 8 (ref 5–15)
BUN: 15 mg/dL (ref 6–20)
CO2: 24 mmol/L (ref 22–32)
Calcium: 6.6 mg/dL — ABNORMAL LOW (ref 8.9–10.3)
Chloride: 106 mmol/L (ref 98–111)
Creatinine, Ser: 1.02 mg/dL (ref 0.61–1.24)
GFR, Estimated: >60 mL/min (ref >60)
Glucose, Bld: 117 mg/dL — ABNORMAL HIGH (ref 70–99)
Potassium: 3.5 mmol/L (ref 3.5–5.1)
Sodium: 138 mmol/L (ref 135–145)

## 2022-03-24 LAB — SURGICAL PATHOLOGY

## 2022-03-24 NOTE — Progress Notes (Signed)
[facility] PROGRESS NOTE    Progress Note  Ryan Hughes  GGY:[id number] DOB: 04-Nov-1965 DOA: 03/22/2022 PCP: Grayce Sessions, NP     Brief Narrative:   Ryan Hughes is an 56 y.o. male past medical history significant for diabetes mellitus, essential hypertension PAD status post bypass and stenting, has had limb threatening ischemia for the last month on aspirin Plavix and statins comes in with critical limb ischemia, so vascular as an outpatient they plan to do a right AKA.  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 Resume aspirin. Vascular to dictate when to restart Plavix. Narcotics and anticoagulation per vascular. Started on bowel regimen. Further management per vascular surgery.  Acute blood loss anemia/normocytic anemia: Status post packed red blood cells hemoglobin this morning is 10. Due to recent surgical intervention. No signs of overt bleeding. We will need to follow-up with PCP as an outpatient.  Hypokalemia: Replete orally now resolved.  Leukocytosis: Likely reactive due to recent amputation. Spiked a temperature overnight leukocytosis is improving. Continue to monitor fever curve.  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  Hold metformin. CBGs before meals and at bedtime.  Mixed hyperlipidemia due to type 2 diabetes mellitus (HCC) Statins.   Essential hypertension: Resume Coreg.  Blood pressure is stable. We will continue to hold ARB.  BPH: Continue Flomax and dutasteride. Foley was exchanged in the OR.  History of CVA: Reportedly he is minimally verbal at baseline. Continue aspirin and statins.  DVT prophylaxis: lovenox Family Communication:none Status is: Observation The patient will require care spanning > 2 midnights and should be moved to inpatient  because: Admit to inpatient for right AKA    Code Status:     Code Status Orders  (From admission, onward)           Start     Ordered   03/22/22 1520  Full code  Continuous        03/22/22 1519           Code Status History     Date Active Date Inactive Code Status Order ID Comments User Context   02/17/2022 1642 02/26/2022 0150 Full Code [id number]  Ernestene Mention, PA-C Inpatient   01/31/2022 1745 02/08/2022 2246 Full Code [id number]  Glade Lloyd, MD ED   12/15/2021 0025 12/22/2021 1843 Full Code [id number]  Eduard Clos, MD ED   03/27/2021 2050 03/28/2021 2355 Full Code [id number]  Eduard Clos, MD ED   08/05/2020 2120 08/07/2020 1810 Full Code [id number]  Hillary Bow, DO ED   04/25/2020 0234 04/27/2020 2211 Full Code [id number]  Shalhoub, Deno Lunger, MD ED      Advance Directive Documentation    Flowsheet Row Most Recent Value  Type of Advance Directive Healthcare Power of Attorney  Pre-existing out of facility DNR order (yellow form or pink MOST form) --  "MOST" Form in Place? --         IV Access:   Peripheral IV   Procedures and diagnostic studies:   No results found.   Medical Consultants:   None.   Subjective:    Ryan Hughes no complaints nonverbal  Objective:    Vitals:   03/23/22 2340 03/24/22 0248 03/24/22 0645 03/24/22 0811  BP: (!) 154/81  135/79 128/75  Pulse: 83  83 80  Resp: 18  17 16   Temp: (!) 100.7  F (38.2 C) 98.9 F (37.2 C) (!) 100.9 F (38.3 C) 98.9 F (37.2 C)  TempSrc: Oral Oral Oral Oral  SpO2: 99%  98% 100%  Weight:      Height:       SpO2: 100 %   Intake/Output Summary (Last 24 hours) at 03/24/2022 0923 Last data filed at 03/24/2022 0645 Gross per 24 hour  Intake 1354.81 ml  Output 1200 ml  Net 154.81 ml    Filed Weights   03/21/22 1021 03/22/22 1033  Weight: 81.2 kg 81.2 kg    Exam: General exam: In no acute distress. Respiratory system: Good air movement and clear to  auscultation. Cardiovascular system: S1 & S2 heard, RRR. No JVD. Gastrointestinal system: Abdomen is nondistended, soft and nontender.  Extremities: Right upper the knee amputation. Skin: No rashes, lesions or ulcers   Data Reviewed:    Labs: Basic Metabolic Panel: Recent Labs  Lab 03/22/22 1119 03/23/22 0349  NA 141 138  K 2.9* 3.8  CL 103 105  CO2 25 24  GLUCOSE 206* 111*  BUN 12 16  CREATININE 1.18 1.06  CALCIUM 7.5* 6.3*    GFR Estimated Creatinine Clearance: 78.4 mL/min (by C-G formula based on SCr of 1.06 mg/dL). Liver Function Tests: Recent Labs  Lab 03/22/22 1119 03/23/22 0349  AST 23 19  ALT 14 11  ALKPHOS 79 62  BILITOT 0.4 0.4  PROT 7.0 5.6*  ALBUMIN 2.4* 1.8*    No results for input(s): "LIPASE", "AMYLASE" in the last 168 hours. No results for input(s): "AMMONIA" in the last 168 hours. Coagulation profile Recent Labs  Lab 03/22/22 1119  INR 1.2    COVID-19 Labs  Recent Labs    03/23/22 0921  FERRITIN 3,135*    Lab Results  Component Value Date   SARSCOV2NAA NEGATIVE 03/27/2021   SARSCOV2NAA NEGATIVE 08/05/2020   SARSCOV2NAA NEGATIVE 04/24/2020    CBC: Recent Labs  Lab 03/22/22 1119 03/22/22 1838 03/23/22 0349 03/23/22 1938 03/24/22 0318  WBC 9.0 9.9 10.6* 13.6* 11.9*  HGB 9.0* 7.7* 7.0* 9.9* 10.1*  HCT 28.7* 23.5* 21.2* 29.0* 30.3*  MCV 89.7 86.7 85.1 84.5 86.3  PLT 420* 333 334 313 318    Cardiac Enzymes: No results for input(s): "CKTOTAL", "CKMB", "CKMBINDEX", "TROPONINI" in the last 168 hours. BNP (last 3 results) No results for input(s): "PROBNP" in the last 8760 hours. CBG: Recent Labs  Lab 03/23/22 0607 03/23/22 1143 03/23/22 1627 03/23/22 2150 03/24/22 0815  GLUCAP 111* 114* 173* 185* 122*    D-Dimer: No results for input(s): "DDIMER" in the last 72 hours. Hgb A1c: Recent Labs    03/23/22 0921  HGBA1C 6.1*   Lipid Profile: No results for input(s): "CHOL", "HDL", "LDLCALC", "TRIG", "CHOLHDL",  "LDLDIRECT" in the last 72 hours. Thyroid function studies: No results for input(s): "TSH", "T4TOTAL", "T3FREE", "THYROIDAB" in the last 72 hours.  Invalid input(s): "FREET3" Anemia work up: Recent Labs    03/23/22 0921  VITAMINB12 280  FOLATE 5.3*  FERRITIN 3,135*  TIBC 132*  IRON 17*  RETICCTPCT 1.4   Sepsis Labs: Recent Labs  Lab 03/22/22 1838 03/23/22 0349 03/23/22 1938 03/24/22 0318  WBC 9.9 10.6* 13.6* 11.9*    Microbiology Recent Results (from the past 240 hour(s))  Surgical pcr screen     Status: Abnormal   Collection Time: 03/22/22 10:20 AM   Specimen: Nasal Mucosa; Nasal Swab  Result Value Ref Range Status   MRSA, PCR POSITIVE (A) NEGATIVE Final    Comment: RESULT  CALLED TO, READ BACK BY AND VERIFIED WITH: RN NANCY IRISH 03/22/22 @ 13:12 BY EM    Staphylococcus aureus POSITIVE (A) NEGATIVE Final    Comment: (NOTE) The Xpert SA Assay (FDA approved for NASAL specimens in patients 39 years of age and older), is one component of a comprehensive surveillance program. It is not intended to diagnose infection nor to guide or monitor treatment. Performed at [facility], [address] [place], [place] [address]      Medications:    aspirin  325 mg Oral q AM   atorvastatin  80 mg Oral QHS   carvedilol  12.5 mg Oral q AM   Chlorhexidine Gluconate Cloth  6 each Topical Once   And   Chlorhexidine Gluconate Cloth  6 each Topical Once   Chlorhexidine Gluconate Cloth  6 each Topical Q0600   dutasteride  0.5 mg Oral q AM   insulin aspart  0-15 Units Subcutaneous TID WC   insulin aspart  0-5 Units Subcutaneous QHS   insulin aspart  4 Units Subcutaneous TID WC   mupirocin ointment  1 Application Nasal BID   pantoprazole  40 mg Oral BID   polyethylene glycol  17 g Oral BID   sodium chloride flush  3 mL Intravenous Q12H   tamsulosin  0.8 mg Oral q AM   Continuous Infusions:  sodium chloride 10 mL/hr at 03/22/22 1813   lactated ringers Stopped  (03/22/22 1439)      LOS: 2 days   Marinda Elk  [facility]  03/24/2022, 9:23 AM

## 2022-03-24 NOTE — Progress Notes (Signed)
[facility]  Subjective  - Non verbal   Objective 128/75 80 98.9 F (37.2 C) (Oral) 16 100%  Intake/Output Summary (Last 24 hours) at 03/24/2022 0949 Last data filed at 03/24/2022 0645 Gross per 24 hour  Intake 1354.81 ml  Output 1200 ml  Net 154.81 ml      Stump warm and appears viable    Assessment/Planning: POD # 2 AKA  I will order retention sock for the right stump.  Left open to air. WBC elevated I suggested IS to bedside.  Mosetta Pigeon 03/24/2022 9:49 AM --  Laboratory Lab Results: Recent Labs    03/23/22 1938 03/24/22 0318  WBC 13.6* 11.9*  HGB 9.9* 10.1*  HCT 29.0* 30.3*  PLT 313 318   BMET Recent Labs    03/22/22 1119 03/23/22 0349  NA 141 138  K 2.9* 3.8  CL 103 105  CO2 25 24  GLUCOSE 206* 111*  BUN 12 16  CREATININE 1.18 1.06  CALCIUM 7.5* 6.3*    COAG Lab Results  Component Value Date   INR 1.2 03/22/2022   INR 1.0 02/17/2022   INR 1.0 02/01/2022   No results found for: "PTT"

## 2022-03-24 NOTE — Progress Notes (Signed)
Orthopedic Tech Progress Note Patient Details:  Ryan Hughes 01-02-1966 [id number] Called in order to [facility] for sock Patient ID: [name], male   DOB: Jul 18, 1965, 56 y.o.   MRN: [id number]  Lovett Calender 03/24/2022, 1:45 PM

## 2022-03-24 NOTE — Progress Notes (Signed)
[facility]  Subjective  - Non verbal, shook his head no when asked about pain   Objective 128/75 80 98.9 F (37.2 C) (Oral) 16 100%  Intake/Output Summary (Last 24 hours) at 03/24/2022 0953 Last data filed at 03/24/2022 0645 Gross per 24 hour  Intake 1354.81 ml  Output 1200 ml  Net 154.81 ml     Right AKA dressing removed. Stump soft  Assessment/Planning: POD # 1 right AKA Would benefit from 2x2 and Tegaderm occlusive dressing to prevent pt from soiling wound bed.  Can be changed daily by nursing   Victorino Sparrow 03/24/2022 9:53 AM --  Laboratory Lab Results: Recent Labs    03/23/22 1938 03/24/22 0318  WBC 13.6* 11.9*  HGB 9.9* 10.1*  HCT 29.0* 30.3*  PLT 313 318    BMET Recent Labs    03/22/22 1119 03/23/22 0349  NA 141 138  K 2.9* 3.8  CL 103 105  CO2 25 24  GLUCOSE 206* 111*  BUN 12 16  CREATININE 1.18 1.06  CALCIUM 7.5* 6.3*     COAG Lab Results  Component Value Date   INR 1.2 03/22/2022   INR 1.0 02/17/2022   INR 1.0 02/01/2022   No results found for: "PTT"   I have independently interviewed and examined patient and agree with PA assessment and plan above.   Victorino Sparrow MD [facility] Office: [phone] Pager: [phone]

## 2022-03-24 NOTE — Evaluation (Signed)
Physical Therapy Evaluation Patient Details Name: Ryan Hughes MRN: [id number] DOB: [birth year] Today's Date: 03/24/2022  History of Present Illness  56 y.o. 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 PMH includes CVA w/ L hemiplegia and aphasia, HTN, DMII.  Clinical Impression  Pt admitted with above diagnosis. Pt from SNF and recommend return for rehab. Initiated bed mobility and sitting EOB. Pt painful with all mvmt but tolerated sitting up. Needed mod A to come to EOB and min A to maintain sitting due to R lean. Will pursue means of transferring but question how much he will be able to assist with his hemiparetic L side and problem solving deficits. For now will need lift transfer to chair. Had some drainage from AKA incision in sitting. RN present and aware.  Pt currently with functional limitations due to the deficits listed below (see PT Problem List). Pt will benefit from skilled PT to increase their independence and safety with mobility to allow discharge to the venue listed below.          Recommendations for follow up therapy are one component of a multi-disciplinary discharge planning process, led by the attending physician.  Recommendations may be updated based on patient status, additional functional criteria and insurance authorization.  Follow Up Recommendations Skilled nursing-short term rehab (<3 hours/day) Can patient physically be transported by private vehicle: No    Assistance Recommended at Discharge Frequent or constant Supervision/Assistance  Patient can return home with the following  Two people to help with walking and/or transfers;A lot of help with bathing/dressing/bathroom;Assist for transportation    Equipment Recommendations Other (comment) (potentially sliding board)  Recommendations for Other Services  OT consult    Functional Status Assessment Patient has had a recent decline in their functional status and demonstrates the ability to make  significant improvements in function in a reasonable and predictable amount of time.     Precautions / Restrictions Precautions Precautions: Fall Restrictions Weight Bearing Restrictions: Yes RLE Weight Bearing: Non weight bearing Other Position/Activity Restrictions: R AKA      Mobility  Bed Mobility Overal bed mobility: Needs Assistance Bed Mobility: Supine to Sit, Sit to Supine     Supine to sit: Max assist Sit to supine: Max assist   General bed mobility comments: max A to come to EOB in part due to pain with mvmt. Pt able to assist minimally by grasping R rail with L hand and then using R elbow to push up.    Transfers                   General transfer comment: not ready to attempt today due to pain and poor balance in sitting    Ambulation/Gait               General Gait Details: unable  Stairs            Wheelchair Mobility    Modified Rankin (Stroke Patients Only)       Balance Overall balance assessment: Needs assistance Sitting-balance support: Bilateral upper extremity supported Sitting balance-Leahy Scale: Poor Sitting balance - Comments: R lean, min A to maintain static sitting Postural control: Right lateral lean     Standing balance comment: unable                             Pertinent Vitals/Pain Pain Assessment Pain Assessment: Faces Faces Pain Scale: Hurts even more Pain Location: R LE  Pain Descriptors / Indicators: Grimacing Pain Intervention(s): Limited activity within patient's tolerance, Monitored during session, Premedicated before session    Home Living Family/patient expects to be discharged to:: Skilled nursing facility                        Prior Function               Mobility Comments: pt unable to relay PLOF but per chart he was ambulatory at a group home before last SNF stay       Hand Dominance   Dominant Hand: Right    Extremity/Trunk Assessment   Upper  Extremity Assessment Upper Extremity Assessment: LUE deficits/detail;Defer to OT evaluation LUE Deficits / Details: L hemiparesis, does have some use of LUE    Lower Extremity Assessment Lower Extremity Assessment: RLE deficits/detail;LLE deficits/detail RLE Deficits / Details: R AKA LLE Deficits / Details: L hemiparesis, hip flex 2-/5, knee ext 2-/5 LLE Sensation: decreased proprioception;decreased light touch LLE Coordination: decreased gross motor    Cervical / Trunk Assessment Cervical / Trunk Assessment: Normal  Communication   Communication: Expressive difficulties (Family reports aphasia and pt with little to no verbal communication normally)  Cognition Arousal/Alertness: Awake/alert Behavior During Therapy: WFL for tasks assessed/performed Overall Cognitive Status: History of cognitive impairments - at baseline                                          General Comments General comments (skin integrity, edema, etc.): VSS, RN present    Exercises Amputee Exercises Hip ABduction/ADduction: AAROM, Right, 5 reps, Supine Straight Leg Raises: AAROM, Right, 5 reps, Supine   Assessment/Plan    PT Assessment Patient needs continued PT services  PT Problem List Decreased strength;Decreased range of motion;Decreased activity tolerance;Decreased mobility;Decreased balance;Decreased cognition;Decreased coordination;Decreased knowledge of use of DME;Decreased safety awareness;Decreased knowledge of precautions;Pain;Impaired sensation;Impaired tone       PT Treatment Interventions DME instruction;Functional mobility training;Therapeutic activities;Therapeutic exercise;Balance training;Patient/family education;Neuromuscular re-education    PT Goals (Current goals can be found in the Care Plan section)  Acute Rehab PT Goals Patient Stated Goal: none stated PT Goal Formulation: With patient Time For Goal Achievement: 04/07/22 Potential to Achieve Goals: Fair     Frequency Min 3X/week     Co-evaluation               AM-PAC PT "6 Clicks" Mobility  Outcome Measure Help needed turning from your back to your side while in a flat bed without using bedrails?: A Lot Help needed moving from lying on your back to sitting on the side of a flat bed without using bedrails?: A Lot Help needed moving to and from a bed to a chair (including a wheelchair)?: Total Help needed standing up from a chair using your arms (e.g., wheelchair or bedside chair)?: Total Help needed to walk in hospital room?: Total Help needed climbing 3-5 steps with a railing? : Total 6 Click Score: 8    End of Session   Activity Tolerance: Patient limited by pain Patient left: in bed;with call bell/phone within reach;with bed alarm set;with nursing/sitter in room Nurse Communication: Mobility status PT Visit Diagnosis: Pain;Hemiplegia and hemiparesis;Other abnormalities of gait and mobility (R26.89) Hemiplegia - Right/Left: Left Hemiplegia - dominant/non-dominant: Non-dominant Hemiplegia - caused by: Cerebral infarction Pain - Right/Left: Right Pain - part of body: Leg    Time:  2355-7322 PT Time Calculation (min) (ACUTE ONLY): 21 min   Charges:   PT Evaluation $PT Eval Moderate Complexity: 1 Mod          [name], PT  Acute Rehab Services Secure chat preferred Office [phone]   Lawana Chambers Teesha Ohm 03/24/2022, 2:27 PM

## 2022-03-25 ENCOUNTER — Inpatient Hospital Stay (HOSPITAL_COMMUNITY): Payer: Medicaid Other

## 2022-03-25 DIAGNOSIS — D638 Anemia in other chronic diseases classified elsewhere: Secondary | ICD-10-CM | POA: Diagnosis not present

## 2022-03-25 DIAGNOSIS — I70221 Atherosclerosis of native arteries of extremities with rest pain, right leg: Secondary | ICD-10-CM | POA: Diagnosis not present

## 2022-03-25 DIAGNOSIS — E1169 Type 2 diabetes mellitus with other specified complication: Secondary | ICD-10-CM | POA: Diagnosis not present

## 2022-03-25 DIAGNOSIS — D62 Acute posthemorrhagic anemia: Secondary | ICD-10-CM | POA: Diagnosis not present

## 2022-03-25 LAB — GLUCOSE, CAPILLARY
Glucose-Capillary: 102 mg/dL — ABNORMAL HIGH (ref 70–99)
Glucose-Capillary: 142 mg/dL — ABNORMAL HIGH (ref 70–99)
Glucose-Capillary: 108 mg/dL — ABNORMAL HIGH (ref 70–99)
Glucose-Capillary: 104 mg/dL — ABNORMAL HIGH (ref 70–99)

## 2022-03-25 LAB — CBC WITH DIFFERENTIAL/PLATELET
Abs Immature Granulocytes: 0.04 10*3/uL (ref 0.00–0.07)
Basophils Absolute: 0 10*3/uL (ref 0.0–0.1)
Basophils Relative: 0 %
Eosinophils Absolute: 0 10*3/uL (ref 0.0–0.5)
Eosinophils Relative: 0 %
HCT: 28.2 % — ABNORMAL LOW (ref 39.0–52.0)
Hemoglobin: 9.5 g/dL — ABNORMAL LOW (ref 13.0–17.0)
Immature Granulocytes: 0 %
Lymphocytes Relative: 17 %
Lymphs Abs: 1.7 10*3/uL (ref 0.7–4.0)
MCH: 28.8 pg (ref 26.0–34.0)
MCHC: 33.7 g/dL (ref 30.0–36.0)
MCV: 85.5 fL (ref 80.0–100.0)
Monocytes Absolute: 0.5 10*3/uL (ref 0.1–1.0)
Monocytes Relative: 5 %
Neutro Abs: 7.7 10*3/uL (ref 1.7–7.7)
Neutrophils Relative %: 78 %
Platelets: 303 10*3/uL (ref 150–400)
RBC: 3.3 MIL/uL — ABNORMAL LOW (ref 4.22–5.81)
RDW: 15 % (ref 11.5–15.5)
WBC: 9.9 10*3/uL (ref 4.0–10.5)
nRBC: 0 % (ref 0.0–0.2)

## 2022-03-25 NOTE — Progress Notes (Addendum)
[facility]  Subjective  - few words, follows commands   Objective (!) 152/81 85 99.5 F (37.5 C) (Oral) 18 96%  Intake/Output Summary (Last 24 hours) at 03/25/2022 3846 Last data filed at 03/24/2022 2218 Gross per 24 hour  Intake 240 ml  Output 375 ml  Net -135 ml    Right AKA stump healing well Retention sock placed on limb  Assessment/Planning: POD # 3 right AKA  Stump warm and healing well, appears viable Retention sock placed over stump to help with edema and stump molding PT currently  recommending SNF Call with any concerns we will see agin on Monday if he is in house.  OK to discharge from a vascular point of view.  F/U in 4 weeks for staple removal.     Mosetta Pigeon 03/25/2022 7:13 AM --  Laboratory Lab Results: Recent Labs    03/23/22 1938 03/24/22 0318  WBC 13.6* 11.9*  HGB 9.9* 10.1*  HCT 29.0* 30.3*  PLT 313 318   BMET Recent Labs    03/23/22 0349 03/24/22 1108  NA 138 138  K 3.8 3.5  CL 105 106  CO2 24 24  GLUCOSE 111* 117*  BUN 16 15  CREATININE 1.06 1.02  CALCIUM 6.3* 6.6*    COAG Lab Results  Component Value Date   INR 1.2 03/22/2022   INR 1.0 02/17/2022   INR 1.0 02/01/2022   No results found for: "PTT"

## 2022-03-25 NOTE — NC FL2 (Signed)
[place] MEDICAID FL2 LEVEL OF CARE SCREENING TOOL     IDENTIFICATION  Patient Name: Ryan Hughes Birthdate: January 01, 1966 Sex: male Admission Date (Current Location): 03/22/2022  [facility] and [place] Number:  Producer, television/film/video and Address:  [facility] [place], [address] [place], [place] [address]      Provider Number: [id number]  Attending Physician Name and Address:  Marinda Elk, MD  Relative Name and Phone Number:       Current Level of Care: Hospital Recommended Level of Care: Skilled Nursing Facility Prior Approval Number:    Date Approved/Denied:   PASRR Number: [id number]  Discharge Plan: SNF    Current Diagnoses: Patient Active Problem List   Diagnosis Date Noted   Acute postoperative anemia due to expected blood loss 03/23/2022   Critical limb ischemia of right lower extremity (HCC) 03/22/2022   PAD (peripheral artery disease) (HCC) 02/17/2022   Hypomagnesemia 02/01/2022   Anemia of chronic disease 02/01/2022   Gangrene (HCC) 01/31/2022   Hypokalemia 12/16/2021   Nausea & vomiting 12/15/2021   Type 2 diabetes mellitus with vascular disease (HCC) 03/27/2021   Intractable nausea and vomiting 08/05/2020   HTN (hypertension) 08/05/2020   Mixed hyperlipidemia due to type 2 diabetes mellitus (HCC) 04/25/2020   Benign prostatic hyperplasia (BPH) with straining on urination 04/25/2020   Nicotine dependence, cigarettes, uncomplicated 04/25/2020   History of CVA (cerebrovascular accident) 04/25/2020   Rash 03/19/2013   Urinary hesitancy 03/19/2013   Back spasm 05/16/2012   Foot pain 07/18/2011   Tingling in extremities 06/18/2011    Orientation RESPIRATION BLADDER Height & Weight     Self, Place  Normal Indwelling catheter Weight: 179 lb 0.2 oz (81.2 kg) Height:  5\' 7"  (170.2 cm)  BEHAVIORAL SYMPTOMS/MOOD NEUROLOGICAL BOWEL NUTRITION STATUS      Continent    AMBULATORY STATUS COMMUNICATION OF NEEDS Skin   Extensive  Assist Verbally Surgical wounds                       Personal Care Assistance Level of Assistance  Bathing, Feeding, Dressing Bathing Assistance: Maximum assistance Feeding assistance: Limited assistance Dressing Assistance: Maximum assistance     Functional Limitations Info  Sight, Hearing, Speech Sight Info: Adequate Hearing Info: Adequate Speech Info: Impaired    SPECIAL CARE FACTORS FREQUENCY  PT (By licensed PT), OT (By licensed OT)                    Contractures Contractures Info: Not present    Additional Factors Info  Code Status Code Status Info: FULL             Current Medications (03/25/2022):  This is the current hospital active medication list Current Facility-Administered Medications  Medication Dose Route Frequency Provider Last Rate Last Admin   0.9 %  sodium chloride infusion   Intravenous Continuous 03/27/2022, MD 10 mL/hr at 03/22/22 1813 New Bag at 03/22/22 1813   acetaminophen (TYLENOL) tablet 650 mg  650 mg Oral Q6H PRN 03/24/22, MD   650 mg at 03/24/22 03/26/22   Or   acetaminophen (TYLENOL) suppository 650 mg  650 mg Rectal Q6H PRN 2119, MD       aspirin tablet 325 mg  325 mg Oral q AM Synetta Fail, MD   325 mg at 03/25/22 0844   atorvastatin (LIPITOR) tablet 80 mg  80 mg Oral QHS 03/27/22, MD  80 mg at 03/24/22 2211   carvedilol (COREG) tablet 12.5 mg  12.5 mg Oral q AM Synetta Fail, MD   12.5 mg at 03/25/22 4818   Chlorhexidine Gluconate Cloth 2 % PADS 6 each  6 each Topical Once Synetta Fail, MD       And   Chlorhexidine Gluconate Cloth 2 % PADS 6 each  6 each Topical Once Synetta Fail, MD       Chlorhexidine Gluconate Cloth 2 % PADS 6 each  6 each Topical Q0600 Synetta Fail, MD   6 each at 03/25/22 0844   dutasteride (AVODART) capsule 0.5 mg  0.5 mg Oral q AM Synetta Fail, MD   0.5 mg at 03/25/22 0850   insulin aspart (novoLOG) injection 0-15  Units  0-15 Units Subcutaneous TID WC Marinda Elk, MD   3 Units at 03/24/22 1843   insulin aspart (novoLOG) injection 0-5 Units  0-5 Units Subcutaneous QHS Marinda Elk, MD       insulin aspart (novoLOG) injection 4 Units  4 Units Subcutaneous TID WC Marinda Elk, MD   4 Units at 03/25/22 5631   lactated ringers infusion   Intravenous Continuous Synetta Fail, MD   Stopped at 03/22/22 1439   morphine (PF) 4 MG/ML injection 4 mg  4 mg Intravenous Q3H PRN Marinda Elk, MD       mupirocin ointment (BACTROBAN) 2 % 1 Application  1 Application Nasal BID Synetta Fail, MD   1 Application at 03/25/22 0845   oxyCODONE-acetaminophen (PERCOCET/ROXICET) 5-325 MG per tablet 1 tablet  1 tablet Oral Q6H PRN Synetta Fail, MD   1 tablet at 03/25/22 0844   pantoprazole (PROTONIX) EC tablet 40 mg  40 mg Oral BID Synetta Fail, MD   40 mg at 03/25/22 0844   polyethylene glycol (MIRALAX / GLYCOLAX) packet 17 g  17 g Oral Daily PRN Synetta Fail, MD       senna-docusate (Senokot-S) tablet 1 tablet  1 tablet Oral QHS PRN Marinda Elk, MD       sodium chloride flush (NS) 0.9 % injection 3 mL  3 mL Intravenous Q12H Synetta Fail, MD   3 mL at 03/25/22 0845   tamsulosin (FLOMAX) capsule 0.8 mg  0.8 mg Oral q AM Synetta Fail, MD   0.8 mg at 03/25/22 4970     Discharge Medications: Please see discharge summary for a list of discharge medications.  Relevant Imaging Results:  Relevant Lab Results:   Additional Information SSN # [id number]  Dellie Burns [place], [place]

## 2022-03-25 NOTE — Progress Notes (Signed)
[facility] PROGRESS NOTE    Progress Note  Ryan Hughes  ZHG:[id number] DOB: [birth year] DOA: 03/22/2022 PCP: Grayce Sessions, NP     Brief Narrative:   Ryan Hughes is an 56 y.o. male past medical history significant for diabetes mellitus, essential hypertension PAD status post bypass and stenting, has had limb threatening ischemia for the last month on aspirin Plavix and statins comes in with critical limb ischemia, so vascular as an outpatient they plan to do a right AKA.  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 Cont asa and plavix Narcotics and anticoagulation per vascular. Started on bowel regimen. Further management per vascular surgery. Brown to the room he was soaking wet shivering, check temperature. Check chest x-ray check blood cultures.  CBC with differential in the morning.  Acute blood loss anemia/normocytic anemia: Status post packed red blood cells hemoglobin this morning is 10. Due to recent surgical intervention. No signs of overt bleeding. We will need to follow-up with PCP as an outpatient.  Hypokalemia: Replete orally now resolved.  Leukocytosis: Likely reactive due to recent amputation. Soak wet this morning recheck a CBC in the morning continue to monitor fever curve.  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  Hold metformin. CBGs before meals and at bedtime.  Mixed hyperlipidemia due to type 2 diabetes mellitus (HCC) Statins.  Essential hypertension: Resume Coreg.  Blood pressure is stable. We will continue to hold ARB.  BPH: Continue Flomax and dutasteride. Foley was exchanged in the OR.  History of CVA: Reportedly he is minimally verbal at baseline. Continue aspirin and statins.  DVT prophylaxis: lovenox Family Communication:none Status is:  Observation The patient will require care spanning > 2 midnights and should be moved to inpatient because: Admit to inpatient for right AKA    Code Status:     Code Status Orders  (From admission, onward)           Start     Ordered   03/22/22 1520  Full code  Continuous        03/22/22 1519           Code Status History     Date Active Date Inactive Code Status Order ID Comments User Context   02/17/2022 1642 02/26/2022 0150 Full Code [id number]  Ernestene Mention, PA-C Inpatient   01/31/2022 1745 02/08/2022 2246 Full Code [id number]  Glade Lloyd, MD ED   12/15/2021 0025 12/22/2021 1843 Full Code [id number]  Eduard Clos, MD ED   03/27/2021 2050 03/28/2021 2355 Full Code [id number]  Eduard Clos, MD ED   08/05/2020 2120 08/07/2020 1810 Full Code [id number]  Hillary Bow, DO ED   04/25/2020 0234 04/27/2020 2211 Full Code [id number]  Shalhoub, Deno Lunger, MD ED      Advance Directive Documentation    Flowsheet Row Most Recent Value  Type of Advance Directive Healthcare Power of Attorney  Pre-existing out of facility DNR order (yellow form or pink MOST form) --  "MOST" Form in Place? --         IV Access:   Peripheral IV   Procedures and diagnostic studies:   No results found.   Medical Consultants:   None.   Subjective:    Ryan Hughes no complaints nonverbal  Objective:    Vitals:   03/24/22 2007 03/25/22 0052 03/25/22 0440 03/25/22 0730  BP: Marland Kitchen)  142/83 (!) 148/80 (!) 152/81 (!) 160/81  Pulse: 90 85 85 81  Resp: 18   17  Temp: 99.4 F (37.4 C) 99.7 F (37.6 C) 99.5 F (37.5 C) 99.8 F (37.7 C)  TempSrc: Oral Oral Oral Oral  SpO2: 97% 98% 96% 98%  Weight:      Height:       SpO2: 98 %   Intake/Output Summary (Last 24 hours) at 03/25/2022 1053 Last data filed at 03/25/2022 0853 Gross per 24 hour  Intake 900 ml  Output 375 ml  Net 525 ml    Filed Weights   03/21/22 1021 03/22/22 1033  Weight: 81.2 kg 81.2 kg     Exam: General exam: In no acute distress. Respiratory system: Good air movement and clear to auscultation. Cardiovascular system: S1 & S2 heard, RRR. No JVD. Gastrointestinal system: Abdomen is nondistended, soft and nontender.  Extremities: Right upper the knee amputation. Skin: No rashes, lesions or ulcers   Data Reviewed:    Labs: Basic Metabolic Panel: Recent Labs  Lab 03/22/22 1119 03/23/22 0349 03/24/22 1108  NA 141 138 138  K 2.9* 3.8 3.5  CL 103 105 106  CO2 25 24 24   GLUCOSE 206* 111* 117*  BUN 12 16 15   CREATININE 1.18 1.06 1.02  CALCIUM 7.5* 6.3* 6.6*    GFR Estimated Creatinine Clearance: 81.5 mL/min (by C-G formula based on SCr of 1.02 mg/dL). Liver Function Tests: Recent Labs  Lab 03/22/22 1119 03/23/22 0349  AST 23 19  ALT 14 11  ALKPHOS 79 62  BILITOT 0.4 0.4  PROT 7.0 5.6*  ALBUMIN 2.4* 1.8*    No results for input(s): "LIPASE", "AMYLASE" in the last 168 hours. No results for input(s): "AMMONIA" in the last 168 hours. Coagulation profile Recent Labs  Lab 03/22/22 1119  INR 1.2    COVID-19 Labs  Recent Labs    03/23/22 0921  FERRITIN 3,135*     Lab Results  Component Value Date   SARSCOV2NAA NEGATIVE 03/27/2021   SARSCOV2NAA NEGATIVE 08/05/2020   SARSCOV2NAA NEGATIVE 04/24/2020    CBC: Recent Labs  Lab 03/22/22 1119 03/22/22 1838 03/23/22 0349 03/23/22 1938 03/24/22 0318  WBC 9.0 9.9 10.6* 13.6* 11.9*  HGB 9.0* 7.7* 7.0* 9.9* 10.1*  HCT 28.7* 23.5* 21.2* 29.0* 30.3*  MCV 89.7 86.7 85.1 84.5 86.3  PLT 420* 333 334 313 318    Cardiac Enzymes: No results for input(s): "CKTOTAL", "CKMB", "CKMBINDEX", "TROPONINI" in the last 168 hours. BNP (last 3 results) No results for input(s): "PROBNP" in the last 8760 hours. CBG: Recent Labs  Lab 03/24/22 0815 03/24/22 1231 03/24/22 1840 03/24/22 2024 03/25/22 0730  GLUCAP 122* 142* 187* 151* 102*    D-Dimer: No results for input(s): "DDIMER" in the last 72  hours. Hgb A1c: Recent Labs    03/23/22 0921  HGBA1C 6.1*    Lipid Profile: No results for input(s): "CHOL", "HDL", "LDLCALC", "TRIG", "CHOLHDL", "LDLDIRECT" in the last 72 hours. Thyroid function studies: No results for input(s): "TSH", "T4TOTAL", "T3FREE", "THYROIDAB" in the last 72 hours.  Invalid input(s): "FREET3" Anemia work up: Recent Labs    03/23/22 0921  VITAMINB12 280  FOLATE 5.3*  FERRITIN 3,135*  TIBC 132*  IRON 17*  RETICCTPCT 1.4    Sepsis Labs: Recent Labs  Lab 03/22/22 1838 03/23/22 0349 03/23/22 1938 03/24/22 0318  WBC 9.9 10.6* 13.6* 11.9*    Microbiology Recent Results (from the past 240 hour(s))  Surgical pcr screen     Status:  Abnormal   Collection Time: 03/22/22 10:20 AM   Specimen: Nasal Mucosa; Nasal Swab  Result Value Ref Range Status   MRSA, PCR POSITIVE (A) NEGATIVE Final    Comment: RESULT CALLED TO, READ BACK BY AND VERIFIED WITH: RN NANCY IRISH 03/22/22 @ 13:12 BY EM    Staphylococcus aureus POSITIVE (A) NEGATIVE Final    Comment: (NOTE) The Xpert SA Assay (FDA approved for NASAL specimens in patients 2 years of age and older), is one component of a comprehensive surveillance program. It is not intended to diagnose infection nor to guide or monitor treatment. Performed at [facility], [address] [place], [place] [address]   Culture, blood (Routine X 2) w Reflex to ID Panel     Status: None (Preliminary result)   Collection Time: 03/24/22  3:18 AM   Specimen: BLOOD  Result Value Ref Range Status   Specimen Description BLOOD LEFT ANTECUBITAL  Final   Special Requests   Final    BOTTLES DRAWN AEROBIC AND ANAEROBIC Blood Culture adequate volume   Culture   Final    NO GROWTH 1 DAY Performed at [facility], [address], [place], [place] [address]    Report Status PENDING  Incomplete  Culture, blood (Routine X 2) w Reflex to ID Panel     Status: None (Preliminary result)   Collection Time: 03/24/22   3:18 AM   Specimen: BLOOD LEFT HAND  Result Value Ref Range Status   Specimen Description BLOOD LEFT HAND  Final   Special Requests   Final    BOTTLES DRAWN AEROBIC AND ANAEROBIC Blood Culture adequate volume   Culture   Final    NO GROWTH 1 DAY Performed at [facility], [address], [place], [place] [address]    Report Status PENDING  Incomplete     Medications:    aspirin  325 mg Oral q AM   atorvastatin  80 mg Oral QHS   carvedilol  12.5 mg Oral q AM   Chlorhexidine Gluconate Cloth  6 each Topical Once   And   Chlorhexidine Gluconate Cloth  6 each Topical Once   Chlorhexidine Gluconate Cloth  6 each Topical Q0600   dutasteride  0.5 mg Oral q AM   insulin aspart  0-15 Units Subcutaneous TID WC   insulin aspart  0-5 Units Subcutaneous QHS   insulin aspart  4 Units Subcutaneous TID WC   mupirocin ointment  1 Application Nasal BID   pantoprazole  40 mg Oral BID   sodium chloride flush  3 mL Intravenous Q12H   tamsulosin  0.8 mg Oral q AM   Continuous Infusions:  sodium chloride 10 mL/hr at 03/22/22 1813   lactated ringers Stopped (03/22/22 1439)      LOS: 3 days   Marinda Elk  [facility]  03/25/2022, 10:53 AM

## 2022-03-25 NOTE — Progress Notes (Signed)
Per MD, pt not medically stable for dc today. Spoke to [place] at [facility] [phone] who confirmed pt able to return over weekend if ready. SW will follow.   Dellie Burns, MSW, LCSW [phone] (coverage)

## 2022-03-26 DIAGNOSIS — I70221 Atherosclerosis of native arteries of extremities with rest pain, right leg: Secondary | ICD-10-CM | POA: Diagnosis not present

## 2022-03-26 DIAGNOSIS — I739 Peripheral vascular disease, unspecified: Secondary | ICD-10-CM | POA: Diagnosis not present

## 2022-03-26 DIAGNOSIS — E1169 Type 2 diabetes mellitus with other specified complication: Secondary | ICD-10-CM | POA: Diagnosis not present

## 2022-03-26 DIAGNOSIS — D62 Acute posthemorrhagic anemia: Secondary | ICD-10-CM | POA: Diagnosis not present

## 2022-03-26 LAB — GLUCOSE, CAPILLARY: Glucose-Capillary: 112 mg/dL — ABNORMAL HIGH (ref 70–99)

## 2022-03-26 NOTE — Progress Notes (Signed)
   VASCULAR SURGERY ASSESSMENT & PLAN:   POD 4 RIGHT AKA: His right AKA is healing up nicely.  He has his retention stocking on.  Awaiting discharge to skilled nursing facility.  SUBJECTIVE:   Pain well controlled.  PHYSICAL EXAM:   Vitals:   03/25/22 0730 03/25/22 1552 03/25/22 2057 03/26/22 0516  BP: (!) 160/81 132/74 (!) 142/84 138/79  Pulse: 81 81 83 81  Resp: 17 16 20 20   Temp: 99.8 F (37.7 C) 99.5 F (37.5 C) 99.1 F (37.3 C) 98.5 F (36.9 C)  TempSrc: Oral Oral Oral Oral  SpO2: 98% 98% 97% 97%  Weight:      Height:       His right AKA looks fine.  LABS:   Lab Results  Component Value Date   WBC 9.9 03/25/2022   HGB 9.5 (L) 03/25/2022   HCT 28.2 (L) 03/25/2022   MCV 85.5 03/25/2022   PLT 303 03/25/2022   Lab Results  Component Value Date   CREATININE 1.02 03/24/2022   Lab Results  Component Value Date   INR 1.2 03/22/2022   CBG (last 3)  Recent Labs    03/25/22 1206 03/25/22 1640 03/25/22 2059  GLUCAP 142* 108* 104*    PROBLEM LIST:    Principal Problem:   Critical limb ischemia of right lower extremity (HCC) Active Problems:   Mixed hyperlipidemia due to type 2 diabetes mellitus (HCC)   Benign prostatic hyperplasia (BPH) with straining on urination   History of CVA (cerebrovascular accident)   HTN (hypertension)   Type 2 diabetes mellitus with vascular disease (HCC)   Anemia of chronic disease   PAD (peripheral artery disease) (HCC)   Acute postoperative anemia due to expected blood loss   CURRENT MEDS:    aspirin  325 mg Oral q AM   atorvastatin  80 mg Oral QHS   carvedilol  12.5 mg Oral q AM   Chlorhexidine Gluconate Cloth  6 each Topical Once   And   Chlorhexidine Gluconate Cloth  6 each Topical Once   Chlorhexidine Gluconate Cloth  6 each Topical Q0600   dutasteride  0.5 mg Oral q AM   insulin aspart  0-15 Units Subcutaneous TID WC   insulin aspart  0-5 Units Subcutaneous QHS   insulin aspart  4 Units Subcutaneous TID WC    mupirocin ointment  1 Application Nasal BID   pantoprazole  40 mg Oral BID   sodium chloride flush  3 mL Intravenous Q12H   tamsulosin  0.8 mg Oral q AM    03/27/22 Office: [phone] 03/26/2022

## 2022-03-26 NOTE — Progress Notes (Signed)
Report called to [facility].  Report given to [facility].  [facility] in the unit.  Pt discharged to [facility]

## 2022-03-26 NOTE — TOC Transition Note (Addendum)
Transition of Care [facility]) - CM/SW Discharge Note   Patient Details  Name: Ryan Hughes MRN: [id number] Date of Birth: August 31, 1965  Transition of Care [facility] CM/SW Contact:  Delilah Shan, LCSWA Phone Number: 03/26/2022, 10:36 AM   Clinical Narrative:     Patient will DC to: [place]  Anticipated DC date: 03/26/2022  Family notified: [name]   Transport by: Sharin Mons  ?  Per MD patient ready for DC to [facility] . RN, patient, patient's family, and facility notified of DC. Discharge Summary sent to facility. RN given number for report tele# [phone] Rm#[id number]. DC packet on chart. Ambulance transport requested for patient.  CSW signing off.    Final next level of care: Skilled Nursing Facility Barriers to Discharge: No Barriers Identified   Patient Goals and CMS Choice Patient states their goals for this hospitalization and ongoing recovery are:: SNF CMS Medicare.gov Compare Post Acute Care list provided to:: Patient Choice offered to / list presented to : Patient  Discharge Placement              Patient chooses bed at: [facility] Patient to be transferred to facility by: [facility] Name of family member notified: [name] Patient and family notified of of transfer: 03/26/22  Discharge Plan and Services                                     Social Determinants of Health (SDOH) Interventions     Readmission Risk Interventions     No data to display

## 2022-03-26 NOTE — Progress Notes (Signed)
[facility] unable to take pt's wheelchair with them.  Called niece Nicholes Calamity and asked her if she can pick up pt's WC.  Ms. Kathlene November said that she is unable to do that due to no vehicle.  She said that Steele Sizer is her daughter and that she does not have a vehicle as well.  Called [facility] and no answer.  Consulting civil engineer and SW made aware.  Pt's wheelchair labeled with pt's name, placed in front of the nursing station.

## 2022-03-26 NOTE — Discharge Summary (Addendum)
Physician Discharge Summary  Ryan Hughes [id number] DOB: 08/12/1965 DOA: 03/22/2022  PCP: [name], NP  Admit date: 03/22/2022 Discharge date: 03/26/2022  Admitted From: Home Disposition:  Home  Recommendations for Outpatient Follow-up:  Follow up with PCP in 1-2 weeks Please obtain BMP/CBC in one week   Home Health:No Equipment/Devices:None  Discharge Condition:Stable CODE STATUS:Full Diet recommendation: Heart Healthy  Brief/Interim Summary:  56 y.o. male past medical history significant for diabetes mellitus, essential hypertension PAD status post bypass and stenting, has had limb threatening ischemia for the last month on aspirin Plavix and statins comes in with critical limb ischemia, so vascular as an outpatient they plan to do a right AKA.   Discharge Diagnoses:  Principal Problem:   Critical limb ischemia of right lower extremity (HCC) Active Problems:   Mixed hyperlipidemia due to type 2 diabetes mellitus (HCC)   Benign prostatic hyperplasia (BPH) with straining on urination   History of CVA (cerebrovascular accident)   HTN (hypertension)   Type 2 diabetes mellitus with vascular disease ([place])   Anemia of chronic disease   PAD (peripheral artery disease) (HCC)   Acute postoperative anemia due to expected blood loss  Critical limb ischemia of the right lower extremity with acute osteomyelitis: Vascular was consulted he 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 Aspirin and Plavix were resumed. Started on a bowel regimen which she had a bowel movement. Chest x-ray and blood cultures were unremarkable CBC showed no leukocytosis he remained afebrile.  Acute blood loss anemia: Likely due to surgery status post 1 unit of packed red blood cells his hemoglobin remained stable no signs of overt bleeding follow-up with PCP as an outpatient.  Hypokalemia: Replete orally now resolved.  Leukocytosis: Likely reactive due to amputation now  resolved.  Diabetes mellitus type 2: No change made to his medication continue current regimen.  Dyslipidemia: No change made to his medication.  Essential hypertension: No changes made to his medication his ARB was held BP resume as an outpatient.  BPH: No changes made to his medication his Foley was exchanged in the OR.  History of CVA: Continue aspirin and statins.  Discharge Instructions  Discharge Instructions     Diet - low sodium heart healthy   Complete by: As directed    Increase activity slowly   Complete by: As directed    No wound care   Complete by: As directed       Allergies as of 03/26/2022       Reactions   Pork-derived Products Other (See Comments)   Gout flares   Shellfish-derived Products Other (See Comments)   Gout flares        Medication List     STOP taking these medications    oxyCODONE-acetaminophen 5-325 MG tablet Commonly known as: PERCOCET/ROXICET       TAKE these medications    acetaminophen 325 MG tablet Commonly known as: TYLENOL Take 650 mg by mouth every 4 (four) hours as needed (pain).   aspirin 325 MG tablet Take 1 tablet (325 mg total) by mouth daily. What changed: when to take this   atorvastatin 80 MG tablet Commonly known as: LIPITOR Take 1 tablet (80 mg total) by mouth at bedtime.   carvedilol 12.5 MG tablet Commonly known as: COREG Take 1 tablet (12.5 mg total) by mouth 2 (two) times daily with a meal. What changed: when to take this   clopidogrel 75 MG tablet Commonly known as: PLAVIX Take 1 tablet (75 mg total) by  mouth daily at 6 (six) AM.   dutasteride 0.5 MG capsule Commonly known as: AVODART Take 1 capsule (0.5 mg total) by mouth daily. What changed: when to take this   insulin aspart 100 UNIT/ML injection Commonly known as: novoLOG Inject 0-15 Units into the skin 3 (three) times daily with meals. CBG 70 - 120: 0 units  CBG 121 - 150: 2 units  CBG 151 - 200: 3 units  CBG 201 - 250: 5  units  CBG 251 - 300: 8 units  CBG 301 - 350: 11 units  CBG 351 - 400: 15 units   insulin lispro 100 UNIT/ML injection Commonly known as: HUMALOG Inject 0-15 Units into the skin See admin instructions. Inject 0-15 units twice daily per sliding scale: CBG 70-120 : 0 units CBG 121-150 : 2 units CBG 151-200 : 3 units CBG 201-250 : 5 units CBG 251-300 : 8 units CBG 301-350 : 11 units CBG 351-400 : 15 units   losartan 50 MG tablet Commonly known as: COZAAR Take 1 tablet (50 mg total) by mouth daily.   metFORMIN 1000 MG tablet Commonly known as: GLUCOPHAGE Take 1 tablet (1,000 mg total) by mouth 2 (two) times daily with a meal. What changed: when to take this   multivitamin with minerals Tabs tablet Take 1 tablet by mouth daily.   pantoprazole 40 MG tablet Commonly known as: PROTONIX Take 1 tablet (40 mg total) by mouth 2 (two) times daily.   povidone-iodine 10 % ointment Commonly known as: BETADINE Apply 1 Application topically daily. To necrotic area of right foot   Resource 2.0 Liqd Take 120 mLs by mouth 2 (two) times daily.   senna-docusate 8.6-50 MG tablet Commonly known as: Senokot-S Take 1 tablet by mouth at bedtime as needed for mild constipation. What changed: reasons to take this   tamsulosin 0.4 MG Caps capsule Commonly known as: FLOMAX Take 2 capsules (0.8 mg total) by mouth daily. What changed: when to take this   thiamine 100 MG tablet Commonly known as: VITAMIN B1 Take 1 tablet (100 mg total) by mouth daily. What changed: when to take this   Trulicity [id number] Sopn Generic drug: Dulaglutide Inject 0.75 mg into the skin once a week. What changed: when to take this        Follow-up Information     Waynetta Sandy, MD Follow up in 4 week(s).   Specialties: Vascular Surgery, Cardiology Why: Office will call you to arrange your appt (sent) Contact information: [place] [address] [phone]                 Allergies  Allergen Reactions   Pork-Derived Products Other (See Comments)    Gout flares   Shellfish-Derived Products Other (See Comments)    Gout flares    Consultations: Vascular surgery   Procedures/Studies: DG CHEST PORT 1 VIEW  Result Date: 03/25/2022 CLINICAL DATA:  Fever EXAM: PORTABLE CHEST 1 VIEW COMPARISON:  04/03/2021 FINDINGS: Stable cardiomediastinal contours. Aortic atherosclerosis. Slightly low lung volumes. Streaky right infrahilar opacity. No pleural effusion or pneumothorax. IMPRESSION: Streaky right infrahilar opacity, atelectasis versus pneumonia. Electronically Signed   By: Davina Poke D.O.   On: 03/25/2022 13:22   (Echo, Carotid, EGD, Colonoscopy, ERCP)    Subjective: No complaints  Discharge Exam: Vitals:   03/26/22 0516 03/26/22 0810  BP: 138/79 (!) 152/80  Pulse: 81 79  Resp: 20 16  Temp: 98.5 F (36.9 C) (!) 97.5 F (36.4 C)  SpO2: 97% 96%   Vitals:   03/25/22 1552 03/25/22 2057 03/26/22 0516 03/26/22 0810  BP: 132/74 (!) 142/84 138/79 (!) 152/80  Pulse: 81 83 81 79  Resp: 16 20 20 16   Temp: 99.5 F (37.5 C) 99.1 F (37.3 C) 98.5 F (36.9 C) (!) 97.5 F (36.4 C)  TempSrc: Oral Oral Oral Oral  SpO2: 98% 97% 97% 96%  Weight:      Height:        General: Pt is alert, awake, not in acute distress Cardiovascular: RRR, S1/S2 +, no rubs, no gallops Respiratory: CTA bilaterally, no wheezing, no rhonchi Abdominal: Soft, NT, ND, bowel sounds + Extremities: no edema, no cyanosis    The results of significant diagnostics from this hospitalization (including imaging, microbiology, ancillary and laboratory) are listed below for reference.     Microbiology: Recent Results (from the past 240 hour(s))  Surgical pcr screen     Status: Abnormal   Collection Time: 03/22/22 10:20 AM   Specimen: Nasal Mucosa; Nasal Swab  Result Value Ref Range Status   MRSA, PCR POSITIVE (A) NEGATIVE Final    Comment: RESULT CALLED TO, READ BACK BY AND  VERIFIED WITH: RN NANCY IRISH 03/22/22 @ 13:12 BY EM    Staphylococcus aureus POSITIVE (A) NEGATIVE Final    Comment: (NOTE) The Xpert SA Assay (FDA approved for NASAL specimens in patients 70 years of age and older), is one component of a comprehensive surveillance program. It is not intended to diagnose infection nor to guide or monitor treatment. Performed at [facility], [place] [address], [place], [place] [address]   Culture, blood (Routine X 2) w Reflex to ID Panel     Status: None (Preliminary result)   Collection Time: 03/24/22  3:18 AM   Specimen: BLOOD  Result Value Ref Range Status   Specimen Description BLOOD LEFT ANTECUBITAL  Final   Special Requests   Final    BOTTLES DRAWN AEROBIC AND ANAEROBIC Blood Culture adequate volume   Culture   Final    NO GROWTH 2 DAYS Performed at [facility], [place] [address] [place], [place] [address]    Report Status PENDING  Incomplete  Culture, blood (Routine X 2) w Reflex to ID Panel     Status: None (Preliminary result)   Collection Time: 03/24/22  3:18 AM   Specimen: BLOOD LEFT HAND  Result Value Ref Range Status   Specimen Description BLOOD LEFT HAND  Final   Special Requests   Final    BOTTLES DRAWN AEROBIC AND ANAEROBIC Blood Culture adequate volume   Culture   Final    NO GROWTH 2 DAYS Performed at [facility], [place] [address], [place], [place] [address]    Report Status PENDING  Incomplete     Labs: BNP (last 3 results) No results for input(s): "BNP" in the last 8760 hours. Basic Metabolic Panel: Recent Labs  Lab 03/22/22 1119 03/23/22 0349 03/24/22 1108  NA 141 138 138  K 2.9* 3.8 3.5  CL 103 105 106  CO2 25 24 24   GLUCOSE 206* 111* 117*  BUN 12 16 15   CREATININE 1.18 1.06 1.02  CALCIUM 7.5* 6.3* 6.6*   Liver Function Tests: Recent Labs  Lab 03/22/22 1119 03/23/22 0349  AST 23 19  ALT 14 11  ALKPHOS 79 62  BILITOT 0.4 0.4  PROT 7.0 5.6*  ALBUMIN 2.4* 1.8*   No results for  input(s): "LIPASE", "AMYLASE" in the last 168  hours. No results for input(s): "AMMONIA" in the last 168 hours. CBC: Recent Labs  Lab 03/22/22 1838 03/23/22 0349 03/23/22 1938 03/24/22 0318 03/25/22 1233  WBC 9.9 10.6* 13.6* 11.9* 9.9  NEUTROABS  --   --   --   --  7.7  HGB 7.7* 7.0* 9.9* 10.1* 9.5*  HCT 23.5* 21.2* 29.0* 30.3* 28.2*  MCV 86.7 85.1 84.5 86.3 85.5  PLT 333 334 313 318 303   Cardiac Enzymes: No results for input(s): "CKTOTAL", "CKMB", "CKMBINDEX", "TROPONINI" in the last 168 hours. BNP: Invalid input(s): "POCBNP" CBG: Recent Labs  Lab 03/25/22 0730 03/25/22 1206 03/25/22 1640 03/25/22 2059 03/26/22 0819  GLUCAP 102* 142* 108* 104* 112*   D-Dimer No results for input(s): "DDIMER" in the last 72 hours. Hgb A1c No results for input(s): "HGBA1C" in the last 72 hours.  Lipid Profile No results for input(s): "CHOL", "HDL", "LDLCALC", "TRIG", "CHOLHDL", "LDLDIRECT" in the last 72 hours. Thyroid function studies No results for input(s): "TSH", "T4TOTAL", "T3FREE", "THYROIDAB" in the last 72 hours.  Invalid input(s): "FREET3" Anemia work up No results for input(s): "VITAMINB12", "FOLATE", "FERRITIN", "TIBC", "IRON", "RETICCTPCT" in the last 72 hours.  Urinalysis    Component Value Date/Time   COLORURINE YELLOW 03/23/2022 2357   APPEARANCEUR CLOUDY (A) 03/23/2022 2357   LABSPEC 1.019 03/23/2022 2357   PHURINE 5.0 03/23/2022 2357   GLUCOSEU NEGATIVE 03/23/2022 2357   HGBUR MODERATE (A) 03/23/2022 2357   BILIRUBINUR NEGATIVE 03/23/2022 2357   BILIRUBINUR negative 12/31/2020 1548   KETONESUR NEGATIVE 03/23/2022 2357   PROTEINUR 30 (A) 03/23/2022 2357   UROBILINOGEN 0.2 12/31/2020 1548   UROBILINOGEN 0.2 07/04/2010 0959   NITRITE NEGATIVE 03/23/2022 2357   LEUKOCYTESUR LARGE (A) 03/23/2022 2357   Sepsis Labs Recent Labs  Lab 03/23/22 0349 03/23/22 1938 03/24/22 0318 03/25/22 1233  WBC 10.6* 13.6* 11.9* 9.9   Microbiology Recent Results (from  the past 240 hour(s))  Surgical pcr screen     Status: Abnormal   Collection Time: 03/22/22 10:20 AM   Specimen: Nasal Mucosa; Nasal Swab  Result Value Ref Range Status   MRSA, PCR POSITIVE (A) NEGATIVE Final    Comment: RESULT CALLED TO, READ BACK BY AND VERIFIED WITH: RN NANCY IRISH 03/22/22 @ 13:12 BY EM    Staphylococcus aureus POSITIVE (A) NEGATIVE Final    Comment: (NOTE) The Xpert SA Assay (FDA approved for NASAL specimens in patients 70 years of age and older), is one component of a comprehensive surveillance program. It is not intended to diagnose infection nor to guide or monitor treatment. Performed at [facility], [address], [place], [place] [address]   Culture, blood (Routine X 2) w Reflex to ID Panel     Status: None (Preliminary result)   Collection Time: 03/24/22  3:18 AM   Specimen: BLOOD  Result Value Ref Range Status   Specimen Description BLOOD LEFT ANTECUBITAL  Final   Special Requests   Final    BOTTLES DRAWN AEROBIC AND ANAEROBIC Blood Culture adequate volume   Culture   Final    NO GROWTH 2 DAYS Performed at [facility], [address] [place], [place] [address]    Report Status PENDING  Incomplete  Culture, blood (Routine X 2) w Reflex to ID Panel     Status: None (Preliminary result)   Collection Time: 03/24/22  3:18 AM   Specimen: BLOOD LEFT HAND  Result Value Ref Range Status   Specimen Description BLOOD LEFT HAND  Final   Special Requests   Final    BOTTLES DRAWN AEROBIC AND ANAEROBIC Blood Culture adequate volume   Culture   Final    NO GROWTH 2 DAYS Performed at [facility], [address] [place], [place] [address]    Report Status PENDING  Incomplete     SIGNED:   Charlynne Cousins, MD  [facility] 03/26/2022, 10:11 AM Pager   If 7PM-7AM, please contact night-coverage [email or web address] Password [email or web address]

## 2022-03-29 LAB — CULTURE, BLOOD (ROUTINE X 2)
Culture: NO GROWTH
Culture: NO GROWTH
Special Requests: ADEQUATE
Special Requests: ADEQUATE

## 2022-04-08 ENCOUNTER — Emergency Department (HOSPITAL_BASED_OUTPATIENT_CLINIC_OR_DEPARTMENT_OTHER): Payer: Medicaid Other

## 2022-04-08 ENCOUNTER — Other Ambulatory Visit: Payer: Self-pay

## 2022-04-08 ENCOUNTER — Emergency Department (HOSPITAL_BASED_OUTPATIENT_CLINIC_OR_DEPARTMENT_OTHER)
Admission: EM | Admit: 2022-04-08 | Discharge: 2022-04-08 | Disposition: A | Discharge status: Home / Self Care | Payer: Medicaid Other | Attending: Emergency Medicine | Admitting: Emergency Medicine

## 2022-04-08 ENCOUNTER — Encounter (HOSPITAL_BASED_OUTPATIENT_CLINIC_OR_DEPARTMENT_OTHER): Payer: Self-pay | Admitting: Emergency Medicine

## 2022-04-08 DIAGNOSIS — Z7982 Long term (current) use of aspirin: Secondary | ICD-10-CM | POA: Insufficient documentation

## 2022-04-08 DIAGNOSIS — Z79899 Other long term (current) drug therapy: Secondary | ICD-10-CM | POA: Diagnosis not present

## 2022-04-08 DIAGNOSIS — R739 Hyperglycemia, unspecified: Secondary | ICD-10-CM | POA: Diagnosis present

## 2022-04-08 DIAGNOSIS — Z7984 Long term (current) use of oral hypoglycemic drugs: Secondary | ICD-10-CM | POA: Diagnosis not present

## 2022-04-08 DIAGNOSIS — I10 Essential (primary) hypertension: Secondary | ICD-10-CM | POA: Insufficient documentation

## 2022-04-08 DIAGNOSIS — W06XXXA Fall from bed, initial encounter: Secondary | ICD-10-CM | POA: Insufficient documentation

## 2022-04-08 DIAGNOSIS — W19XXXA Unspecified fall, initial encounter: Secondary | ICD-10-CM

## 2022-04-08 DIAGNOSIS — E1165 Type 2 diabetes mellitus with hyperglycemia: Secondary | ICD-10-CM | POA: Diagnosis not present

## 2022-04-08 DIAGNOSIS — R519 Headache, unspecified: Secondary | ICD-10-CM | POA: Diagnosis not present

## 2022-04-08 DIAGNOSIS — Z794 Long term (current) use of insulin: Secondary | ICD-10-CM | POA: Diagnosis not present

## 2022-04-08 LAB — CBG MONITORING, ED: Glucose-Capillary: 136 mg/dL — ABNORMAL HIGH (ref 70–99)

## 2022-04-08 NOTE — ED Triage Notes (Signed)
Pt arrives to ED via [facility] with c/o fall off bed resulting in posterior head injury.

## 2022-04-08 NOTE — ED Notes (Signed)
3:00 Rick at [facility] called for transport. ABB(NS)

## 2022-04-08 NOTE — Discharge Instructions (Addendum)
Fortunately your CT scan of your head and neck showed no fractures to your skull, spine, and no bleeding in your brain.  If there are any other new symptoms such as vomiting, change in mental status, or any other new/concerning symptoms or return to the ER for evaluation.

## 2022-04-08 NOTE — ED Notes (Signed)
Discharge instructions and follow up care reviewed with family member via phone and [facility] transport prior to being transferred back to facility. Attempted to call [facility] with no answer.

## 2022-04-08 NOTE — ED Notes (Signed)
Attempted to call [facility] to give report, someone answered initially stating they would "forward to the floor" and rang for 2 minutes with no answer. Report given to [facility] (transport).

## 2022-04-08 NOTE — ED Provider Notes (Signed)
[facility] EMERGENCY DEPT Provider Note   CSN: [id number] Arrival date & time: 04/08/22  1343     History  Chief Complaint  Patient presents with   Ryan Hughes    Murice Barbar is a 56 y.o. male.  HPI 56 year old male with a complex past medical history that includes recent BKA, diabetes, stroke, hypertension presents with a fall.  History is from EMS.  The patient is nonverbal and this seems to be his baseline.  The patient rolled out of bed and this was witnessed by staff at [facility].  He struck his head though did not seem like he lost consciousness.  EMS reports a knot to the back of his head.  Patient is nonverbal though does at times shake his head no when asked about certain areas of pain.  He is reportedly not on any blood thinners.  The medication list from the facility does not include Plavix though this was included in his discharge summary on 11/18.  I attempted to call the facility but no one answered.  Home Medications Prior to Admission medications   Medication Sig Start Date End Date Taking? Authorizing Provider  acetaminophen (TYLENOL) 325 MG tablet Take 650 mg by mouth every 4 (four) hours as needed (pain).    [provider]  aspirin 325 MG tablet Take 1 tablet (325 mg total) by mouth daily. Patient taking differently: Take 325 mg by mouth in the morning. 02/09/22   Calvert Cantor, MD  atorvastatin (LIPITOR) 80 MG tablet Take 1 tablet (80 mg total) by mouth at bedtime. 12/10/21   Grayce Sessions, NP  carvedilol (COREG) 12.5 MG tablet Take 1 tablet (12.5 mg total) by mouth 2 (two) times daily with a meal. Patient taking differently: Take 12.5 mg by mouth in the morning. 02/08/22   Calvert Cantor, MD  clopidogrel (PLAVIX) 75 MG tablet Take 1 tablet (75 mg total) by mouth daily at 6 (six) AM. Patient not taking: Reported on 03/22/2022 02/26/22   Baglia, Corrina, PA-C  Dulaglutide (TRULICITY) 0.75 MG/0.5ML SOPN Inject 0.75 mg into the skin once a  week. Patient taking differently: Inject 0.75 mg into the skin every Monday. 12/08/21   Grayce Sessions, NP  dutasteride (AVODART) 0.5 MG capsule Take 1 capsule (0.5 mg total) by mouth daily. Patient taking differently: Take 0.5 mg by mouth in the morning. 12/20/21   Marinda Elk, MD  insulin aspart (NOVOLOG) 100 UNIT/ML injection Inject 0-15 Units into the skin 3 (three) times daily with meals. CBG 70 - 120: 0 units  CBG 121 - 150: 2 units  CBG 151 - 200: 3 units  CBG 201 - 250: 5 units  CBG 251 - 300: 8 units  CBG 301 - 350: 11 units  CBG 351 - 400: 15 units Patient not taking: Reported on 03/22/2022 02/08/22   Calvert Cantor, MD  insulin lispro (HUMALOG) 100 UNIT/ML injection Inject 0-15 Units into the skin See admin instructions. Inject 0-15 units twice daily per sliding scale: CBG 70-120 : 0 units CBG 121-150 : 2 units CBG 151-200 : 3 units CBG 201-250 : 5 units CBG 251-300 : 8 units CBG 301-350 : 11 units CBG 351-400 : 15 units    [provider]  losartan (COZAAR) 50 MG tablet Take 1 tablet (50 mg total) by mouth daily. Patient not taking: Reported on 03/22/2022 12/20/21   Marinda Elk, MD  metFORMIN (GLUCOPHAGE) 1000 MG tablet Take 1 tablet (1,000 mg total) by mouth 2 (two)  times daily with a meal. Patient taking differently: Take 1,000 mg by mouth 2 (two) times daily. 04/19/21 05/14/22  Grayce Sessions, NP  Multiple Vitamin (MULTIVITAMIN WITH MINERALS) TABS tablet Take 1 tablet by mouth daily. Patient not taking: Reported on 03/22/2022 03/29/21   Glade Lloyd, MD  Nutritional Supplements (RESOURCE 2.0) LIQD Take 120 mLs by mouth 2 (two) times daily.    [provider]  pantoprazole (PROTONIX) 40 MG tablet Take 1 tablet (40 mg total) by mouth 2 (two) times daily. 12/20/21   Marinda Elk, MD  povidone-iodine (BETADINE) 10 % ointment Apply 1 Application topically daily. To necrotic area of right foot    [provider]   senna-docusate (SENOKOT-S) 8.6-50 MG tablet Take 1 tablet by mouth at bedtime as needed for mild constipation. Patient taking differently: Take 1 tablet by mouth at bedtime as needed (constipation). 02/08/22   Calvert Cantor, MD  tamsulosin (FLOMAX) 0.4 MG CAPS capsule Take 2 capsules (0.8 mg total) by mouth daily. Patient taking differently: Take 0.8 mg by mouth in the morning. 12/20/21   Marinda Elk, MD  thiamine (VITAMIN B1) 100 MG tablet Take 1 tablet (100 mg total) by mouth daily. Patient taking differently: Take 100 mg by mouth in the morning. 02/08/22   Calvert Cantor, MD      Allergies    Pork-derived products and Shellfish-derived products    Review of Systems   Review of Systems  Unable to perform ROS: Patient nonverbal    Physical Exam Updated Vital Signs BP (!) 150/102 (BP Location: Right Arm)   Pulse 87   Temp 98.3 F (36.8 C) (Oral)   Resp 18   SpO2 99%  Physical Exam Vitals and nursing note reviewed.  Constitutional:      Appearance: He is well-developed.  HENT:     Head: Normocephalic and atraumatic.     Comments: No scalp tenderness. Has a prominent occiput Cardiovascular:     Rate and Rhythm: Normal rate and regular rhythm.     Heart sounds: Normal heart sounds.  Pulmonary:     Effort: Pulmonary effort is normal.     Breath sounds: Normal breath sounds.  Abdominal:     Palpations: Abdomen is soft.     Tenderness: There is no abdominal tenderness.  Musculoskeletal:     Cervical back: No spinous process tenderness.     Comments: Right BKA. Incision is C/D/I.  No obvious extremity trauma  Skin:    General: Skin is warm and dry.  Neurological:     Mental Status: He is alert.     Comments: Patient is awake and tracks me. He does move all 4 extremities to command     ED Results / Procedures / Treatments   Labs (all labs ordered are listed, but only abnormal results are displayed) Labs Reviewed  CBG MONITORING, ED - Abnormal; Notable for the  following components:      Result Value   Glucose-Capillary 136 (*)    All other components within normal limits    EKG None  Radiology CT Head Wo Contrast  Result Date: 04/08/2022 CLINICAL DATA:  Fall from bed, striking posterior head. Neck trauma. EXAM: CT HEAD WITHOUT CONTRAST CT CERVICAL SPINE WITHOUT CONTRAST TECHNIQUE: Multidetector CT imaging of the head and cervical spine was performed following the standard protocol without intravenous contrast. Multiplanar CT image reconstructions of the cervical spine were also generated. RADIATION DOSE REDUCTION: This exam was performed according to the departmental dose-optimization program  which includes automated exposure control, adjustment of the mA and/or kV according to patient size and/or use of iterative reconstruction technique. COMPARISON:  CT head 09/17/2021, CT angiography of the neck dated 03/27/2021 FINDINGS: CT HEAD FINDINGS Brain: Periventricular white matter and corona radiata hypodensities favor chronic ischemic microvascular white matter disease. Remote infarct involving the right basal ganglia including the lentiform nucleus and head of the right caudate nucleus, causing a laterally bulging contour of the frontal horn of the right lateral ventricle as on the prior exam. Otherwise, the brainstem, cerebellum, cerebral peduncles, thalamus, basal ganglia, basilar cisterns, and ventricular system appear within normal limits. No intracranial hemorrhage, mass lesion, or acute CVA. Vascular: Unremarkable Skull: Unremarkable Sinuses/Orbits: Unremarkable Other: No supplemental non-categorized findings. CT CERVICAL SPINE FINDINGS Alignment: No vertebral subluxation is observed. Skull base and vertebrae: Broad Schmorl's node along the superior endplate of T1, no change from 03/27/2021. No fracture or acute bony findings identified. Soft tissues and spinal canal: Mild bilateral common carotid atherosclerotic calcification. Disc levels: Left foraminal  stenosis at C3-4 due to uncinate spurring and somewhat short pedicles. Upper chest: Unremarkable Other: No supplemental non-categorized findings. IMPRESSION: 1. No acute intracranial findings or acute cervical spine findings. 2. Periventricular white matter and corona radiata hypodensities favor chronic ischemic microvascular white matter disease. 3. Remote infarct involving the right basal ganglia. 4. Mild bilateral common carotid atherosclerotic calcification. 5. Left foraminal stenosis at C3-4 due to uncinate spurring and somewhat short pedicles. Electronically Signed   By: Gaylyn RongWalter  Liebkemann M.D.   On: 04/08/2022 15:00   CT Cervical Spine Wo Contrast  Result Date: 04/08/2022 CLINICAL DATA:  Fall from bed, striking posterior head. Neck trauma. EXAM: CT HEAD WITHOUT CONTRAST CT CERVICAL SPINE WITHOUT CONTRAST TECHNIQUE: Multidetector CT imaging of the head and cervical spine was performed following the standard protocol without intravenous contrast. Multiplanar CT image reconstructions of the cervical spine were also generated. RADIATION DOSE REDUCTION: This exam was performed according to the departmental dose-optimization program which includes automated exposure control, adjustment of the mA and/or kV according to patient size and/or use of iterative reconstruction technique. COMPARISON:  CT head 09/17/2021, CT angiography of the neck dated 03/27/2021 FINDINGS: CT HEAD FINDINGS Brain: Periventricular white matter and corona radiata hypodensities favor chronic ischemic microvascular white matter disease. Remote infarct involving the right basal ganglia including the lentiform nucleus and head of the right caudate nucleus, causing a laterally bulging contour of the frontal horn of the right lateral ventricle as on the prior exam. Otherwise, the brainstem, cerebellum, cerebral peduncles, thalamus, basal ganglia, basilar cisterns, and ventricular system appear within normal limits. No intracranial hemorrhage,  mass lesion, or acute CVA. Vascular: Unremarkable Skull: Unremarkable Sinuses/Orbits: Unremarkable Other: No supplemental non-categorized findings. CT CERVICAL SPINE FINDINGS Alignment: No vertebral subluxation is observed. Skull base and vertebrae: Broad Schmorl's node along the superior endplate of T1, no change from 03/27/2021. No fracture or acute bony findings identified. Soft tissues and spinal canal: Mild bilateral common carotid atherosclerotic calcification. Disc levels: Left foraminal stenosis at C3-4 due to uncinate spurring and somewhat short pedicles. Upper chest: Unremarkable Other: No supplemental non-categorized findings. IMPRESSION: 1. No acute intracranial findings or acute cervical spine findings. 2. Periventricular white matter and corona radiata hypodensities favor chronic ischemic microvascular white matter disease. 3. Remote infarct involving the right basal ganglia. 4. Mild bilateral common carotid atherosclerotic calcification. 5. Left foraminal stenosis at C3-4 due to uncinate spurring and somewhat short pedicles. Electronically Signed   By: Annitta NeedsWalter  Liebkemann M.D.  On: 04/08/2022 15:00    Procedures Procedures    Medications Ordered in ED Medications - No data to display  ED Course/ Medical Decision Making/ A&P                           Medical Decision Making Amount and/or Complexity of Data Reviewed Labs:     Details: Mild hyperglycemia Radiology: ordered and independent interpretation performed.    Details: No head bleed or cervical spine fracture   Patient appeared to roll out of bed and this was witnessed by staff.  Low suspicion for syncope.  No obvious new neurodeficits.  He is nonverbal at baseline per EMS and chart review.  Otherwise, try to call the emergency contact on call but it went straight to voicemail.  [facility] did not answer.  However based on patient's continued stable appearance, no change in mental status and negative work-up, I think he is  stable for discharge back to his facility.        Final Clinical Impression(s) / ED Diagnoses Final diagnoses:  Fall, initial encounter    Rx / DC Orders ED Discharge Orders     None         Pricilla Loveless, MD 04/08/22 1510

## 2022-04-20 ENCOUNTER — Ambulatory Visit (INDEPENDENT_AMBULATORY_CARE_PROVIDER_SITE_OTHER): Payer: Medicaid Other | Admitting: Physician Assistant

## 2022-04-20 VITALS — BP 135/75 | HR 98 | Temp 97.6°F

## 2022-04-20 DIAGNOSIS — Z89611 Acquired absence of right leg above knee: Secondary | ICD-10-CM

## 2022-04-20 DIAGNOSIS — I739 Peripheral vascular disease, unspecified: Secondary | ICD-10-CM

## 2022-04-20 NOTE — Progress Notes (Signed)
POST OPERATIVE OFFICE NOTE    CC:  F/u for surgery  HPI:  This is a 56 y.o. male who is s/p right AKA on 03/22/22 by Dr. Randie Heinz.  He is at [facility]. Presents with CNA today for visit. He says he is doing well overall. Denies any phantom pain. Says he does not stand LLE or transfer. Says he is not doing therapy but CNA says that she believes he is getting PT. He denies any pain in left leg or foot. No wounds.   Allergies  Allergen Reactions   Pork-Derived Products Other (See Comments)    Gout flares   Shellfish-Derived Products Other (See Comments)    Gout flares    Current Outpatient Medications  Medication Sig Dispense Refill   acetaminophen (TYLENOL) 325 MG tablet Take 650 mg by mouth every 4 (four) hours as needed (pain).     aspirin 325 MG tablet Take 1 tablet (325 mg total) by mouth daily. (Patient taking differently: Take 325 mg by mouth in the morning.)     atorvastatin (LIPITOR) 80 MG tablet Take 1 tablet (80 mg total) by mouth at bedtime. 90 tablet 0   carvedilol (COREG) 12.5 MG tablet Take 1 tablet (12.5 mg total) by mouth 2 (two) times daily with a meal. (Patient taking differently: Take 12.5 mg by mouth in the morning.)     clopidogrel (PLAVIX) 75 MG tablet Take 1 tablet (75 mg total) by mouth daily at 6 (six) AM. (Patient not taking: Reported on 03/22/2022) 30 tablet 2   Dulaglutide (TRULICITY) 0.75 MG/0.5ML SOPN Inject 0.75 mg into the skin once a week. (Patient taking differently: Inject 0.75 mg into the skin every Monday.) 2 mL 3   dutasteride (AVODART) 0.5 MG capsule Take 1 capsule (0.5 mg total) by mouth daily. (Patient taking differently: Take 0.5 mg by mouth in the morning.) 30 capsule 3   insulin aspart (NOVOLOG) 100 UNIT/ML injection Inject 0-15 Units into the skin 3 (three) times daily with meals. CBG 70 - 120: 0 units  CBG 121 - 150: 2 units  CBG 151 - 200: 3 units  CBG 201 - 250: 5 units  CBG 251 - 300: 8 units  CBG 301 - 350: 11 units  CBG 351 - 400: 15  units (Patient not taking: Reported on 03/22/2022) 10 mL 11   insulin lispro (HUMALOG) 100 UNIT/ML injection Inject 0-15 Units into the skin See admin instructions. Inject 0-15 units twice daily per sliding scale: CBG 70-120 : 0 units CBG 121-150 : 2 units CBG 151-200 : 3 units CBG 201-250 : 5 units CBG 251-300 : 8 units CBG 301-350 : 11 units CBG 351-400 : 15 units     losartan (COZAAR) 50 MG tablet Take 1 tablet (50 mg total) by mouth daily. (Patient not taking: Reported on 03/22/2022) 30 tablet 3   metFORMIN (GLUCOPHAGE) 1000 MG tablet Take 1 tablet (1,000 mg total) by mouth 2 (two) times daily with a meal. (Patient taking differently: Take 1,000 mg by mouth 2 (two) times daily.) 180 tablet 1   Multiple Vitamin (MULTIVITAMIN WITH MINERALS) TABS tablet Take 1 tablet by mouth daily. (Patient not taking: Reported on 03/22/2022) 30 tablet 0   Nutritional Supplements (RESOURCE 2.0) LIQD Take 120 mLs by mouth 2 (two) times daily.     pantoprazole (PROTONIX) 40 MG tablet Take 1 tablet (40 mg total) by mouth 2 (two) times daily. 603 tablet 0   povidone-iodine (BETADINE) 10 % ointment Apply 1 Application topically  daily. To necrotic area of right foot     senna-docusate (SENOKOT-S) 8.6-50 MG tablet Take 1 tablet by mouth at bedtime as needed for mild constipation. (Patient taking differently: Take 1 tablet by mouth at bedtime as needed (constipation).)     tamsulosin (FLOMAX) 0.4 MG CAPS capsule Take 2 capsules (0.8 mg total) by mouth daily. (Patient taking differently: Take 0.8 mg by mouth in the morning.) 30 capsule 3   thiamine (VITAMIN B1) 100 MG tablet Take 1 tablet (100 mg total) by mouth daily. (Patient taking differently: Take 100 mg by mouth in the morning.) 30 tablet 0   No current facility-administered medications for this visit.     ROS:  See HPI  Physical Exam:  Vitals:   04/20/22 1427  BP: 135/75  Pulse: 98  Temp: 97.6 F (36.4 C)  SpO2: 99%   General: chronically ill  appearing, in no distress Incision:  right AKA healing well. Staples removed. Mildly tender in mid AKA. Viable flaps Extremities:  LLE well perfused and warm  Neuro: alert and oriented   Assessment/Plan:  This is a 56 y.o. male who is s/p: right AKA on 11/14/243 by Dr. Randie Heinz. 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 His right AKA is healing very well. Staples removed today. Recommend washing incision with mild soap and water, pat dry. Can then leave open to air.  - He says he does not anticipate use of prosthesis but said if he changed his mind to let us know and we could make referral - Discussed importance of keeping left foot protected - continue Aspirin, statin, Plavix - Advised him to call should he have any concerns about continued healing of right AKA or any new or concerning symptoms of LLE - I will have him follow up in 3 months with ABI to continue to monitor LLE   Nathanial Rancher, [facility] [phone]   Clinic MD:  [place]icks[place]

## 2022-05-03 ENCOUNTER — Other Ambulatory Visit: Payer: Self-pay

## 2022-05-03 DIAGNOSIS — I739 Peripheral vascular disease, unspecified: Secondary | ICD-10-CM

## 2022-07-12 ENCOUNTER — Other Ambulatory Visit (HOSPITAL_COMMUNITY): Payer: Self-pay | Admitting: Urology

## 2022-07-12 DIAGNOSIS — N312 Flaccid neuropathic bladder, not elsewhere classified: Secondary | ICD-10-CM

## 2022-07-12 DIAGNOSIS — R339 Retention of urine, unspecified: Secondary | ICD-10-CM

## 2022-07-18 ENCOUNTER — Other Ambulatory Visit: Payer: Self-pay | Admitting: Physician Assistant

## 2022-07-18 DIAGNOSIS — N401 Enlarged prostate with lower urinary tract symptoms: Secondary | ICD-10-CM

## 2022-07-19 ENCOUNTER — Encounter (HOSPITAL_COMMUNITY): Payer: Self-pay

## 2022-07-19 ENCOUNTER — Ambulatory Visit (HOSPITAL_COMMUNITY)
Admission: RE | Admit: 2022-07-19 | Discharge: 2022-07-19 | Disposition: A | Discharge status: Home / Self Care | Payer: Medicaid Other | Source: Ambulatory Visit | Attending: Urology | Admitting: Urology

## 2022-07-19 DIAGNOSIS — N401 Enlarged prostate with lower urinary tract symptoms: Secondary | ICD-10-CM | POA: Diagnosis present

## 2022-07-19 DIAGNOSIS — N312 Flaccid neuropathic bladder, not elsewhere classified: Secondary | ICD-10-CM | POA: Diagnosis present

## 2022-07-19 DIAGNOSIS — R3916 Straining to void: Secondary | ICD-10-CM | POA: Diagnosis present

## 2022-07-19 DIAGNOSIS — R339 Retention of urine, unspecified: Secondary | ICD-10-CM | POA: Diagnosis present

## 2022-07-19 LAB — CBC
HCT: 33.3 % — ABNORMAL LOW (ref 39.0–52.0)
Hemoglobin: 11.1 g/dL — ABNORMAL LOW (ref 13.0–17.0)
MCH: 31 pg (ref 26.0–34.0)
MCHC: 33.3 g/dL (ref 30.0–36.0)
MCV: 93 fL (ref 80.0–100.0)
Platelets: 273 10*3/uL (ref 150–400)
RBC: 3.58 MIL/uL — ABNORMAL LOW (ref 4.22–5.81)
RDW: 14.6 % (ref 11.5–15.5)
WBC: 6 10*3/uL (ref 4.0–10.5)
nRBC: 0 % (ref 0.0–0.2)

## 2022-07-19 LAB — PROTIME-INR
INR: 1 (ref 0.8–1.2)
Prothrombin Time: 12.6 seconds (ref 11.4–15.2)

## 2022-07-19 NOTE — Progress Notes (Signed)
   Pt with Areflexic bladder; Urinary retention Was scheduled today for suprapubic catheter placement per Dr Phillis Knack  Pt has come to [facility] from [place] facility for procedure Unfortunately, pt still taking ASA 325 daily Last dose yesterday  Discussed with Dr Earleen Newport Must reschedule and have pt off ASA 5 days to safely move ahead.  Facility aware Transport aware Pt aware  Schedulers will call facility asap to reschedule appt for next week.

## 2022-07-27 ENCOUNTER — Encounter (HOSPITAL_COMMUNITY): Payer: Self-pay

## 2022-07-27 ENCOUNTER — Ambulatory Visit (HOSPITAL_COMMUNITY)
Admission: RE | Admit: 2022-07-27 | Discharge: 2022-07-27 | Disposition: A | Discharge status: Home / Self Care | Payer: Medicaid Other | Source: Ambulatory Visit | Attending: Urology | Admitting: Urology

## 2022-07-27 VITALS — BP 161/88 | HR 97 | Temp 97.1°F | Resp 16 | Ht 64.0 in | Wt 185.0 lb

## 2022-07-27 DIAGNOSIS — R339 Retention of urine, unspecified: Secondary | ICD-10-CM | POA: Insufficient documentation

## 2022-07-27 DIAGNOSIS — R3911 Hesitancy of micturition: Secondary | ICD-10-CM

## 2022-07-27 LAB — CBC
HCT: 34.8 % — ABNORMAL LOW (ref 39.0–52.0)
Hemoglobin: 11 g/dL — ABNORMAL LOW (ref 13.0–17.0)
MCH: 30.1 pg (ref 26.0–34.0)
MCHC: 31.6 g/dL (ref 30.0–36.0)
MCV: 95.3 fL (ref 80.0–100.0)
Platelets: 274 10*3/uL (ref 150–400)
RBC: 3.65 MIL/uL — ABNORMAL LOW (ref 4.22–5.81)
RDW: 14.4 % (ref 11.5–15.5)
WBC: 5.4 10*3/uL (ref 4.0–10.5)
nRBC: 0 % (ref 0.0–0.2)

## 2022-07-27 LAB — PROTIME-INR
INR: 1 (ref 0.8–1.2)
Prothrombin Time: 12.9 seconds (ref 11.4–15.2)

## 2022-07-27 LAB — GLUCOSE, CAPILLARY
Glucose-Capillary: 98 mg/dL (ref 70–99)
Glucose-Capillary: 86 mg/dL (ref 70–99)

## 2022-07-27 MED ORDER — MIDAZOLAM HCL 2 MG/2ML IJ SOLN
INTRAMUSCULAR | Status: AC
  Filled 2022-07-27: qty 2

## 2022-07-27 MED ORDER — FENTANYL CITRATE (PF) 100 MCG/2ML IJ SOLN
INTRAMUSCULAR | Status: AC | PRN
  Administered 2022-07-27: 25 ug via INTRAVENOUS
  Administered 2022-07-27: 50 ug via INTRAVENOUS
  Administered 2022-07-27: 25 ug via INTRAVENOUS

## 2022-07-27 MED ORDER — MIDAZOLAM HCL 2 MG/2ML IJ SOLN
INTRAMUSCULAR | Status: AC | PRN
  Administered 2022-07-27: .5 mg via INTRAVENOUS
  Administered 2022-07-27: 1 mg via INTRAVENOUS
  Administered 2022-07-27: .5 mg via INTRAVENOUS

## 2022-07-27 MED ORDER — CARVEDILOL 12.5 MG PO TABS
12.5000 mg | ORAL_TABLET | Freq: Once | ORAL | Status: AC
  Administered 2022-07-27: 12.5 mg via ORAL
  Filled 2022-07-27: qty 1

## 2022-07-27 MED ORDER — SODIUM CHLORIDE 0.9 % IV SOLN: INTRAVENOUS | Status: DC

## 2022-07-27 MED ORDER — LOSARTAN POTASSIUM 50 MG PO TABS
50.0000 mg | ORAL_TABLET | Freq: Once | ORAL | Status: AC
  Administered 2022-07-27: 50 mg via ORAL
  Filled 2022-07-27: qty 1

## 2022-07-27 MED ORDER — HYDRALAZINE HCL 20 MG/ML IJ SOLN
20.0000 mg | Freq: Once | INTRAMUSCULAR | Status: AC
  Administered 2022-07-27: 20 mg via INTRAVENOUS
  Filled 2022-07-27: qty 1

## 2022-07-27 MED ORDER — FENTANYL CITRATE (PF) 100 MCG/2ML IJ SOLN
INTRAMUSCULAR | Status: AC
  Filled 2022-07-27: qty 2

## 2022-07-27 NOTE — Progress Notes (Signed)
Hydralazine given. Jamie PA to bedside, clot forming gauze placed to site, BP coming down, bleeding resolved

## 2022-07-27 NOTE — H&P (Signed)
Chief Complaint: Patient was seen in consultation today for urinary retention.  Referring Physician(s): Marton Redwood III  Supervising Physician: Mir, Biochemist, clinical  Patient Status: [facility] - Out-pt  History of Present Illness: Ryan Hughes is a 57 y.o. male with a past medical history significant for tobacco use, CVA, HTN, HLD, PAD s/p AKA, DM, BPH and areflexic bladder who presents today for suprapubic catheter placement. Ryan Hughes has a history of urinary retention with chronic indwelling foley followed by urology. IR has been consulted for suprapubic catheter placement for which he presents today.   Patient non verbal, nods yes/no to simple questions. Discussed procedure with patient's niece Willey Blade who is agreeable to proceed and gives verbal consent over the phone.  Past Medical History:  Diagnosis Date   Acute arterial ischemic stroke, vertebrobasilar, thalamic, left ([place]) 04/25/2020   Acute CVA (cerebrovascular accident) ([place]) 03/27/2021   BPH (benign prostatic hyperplasia)    Diabetes mellitus without complication ([place])    HLD (hyperlipidemia)    Hyperglycemic hyperosmolar nonketotic coma ([place]) 12/17/2021   Hypertension    Nicotine dependence, cigarettes, uncomplicated [id number]   Stroke [facility])     Past Surgical History:  Procedure Laterality Date   ABDOMINAL AORTOGRAM W/LOWER EXTREMITY N/A 02/02/2022   Procedure: ABDOMINAL AORTOGRAM W/LOWER EXTREMITY;  Surgeon: Broadus John, MD;  Location: [place] [facility];  Service: Cardiovascular;  Laterality: N/A;   AMPUTATION Right 02/17/2022   Procedure: AMPUTATION DIGIT GREAT AND SECOND TOES;  Surgeon: Waynetta Sandy, MD;  Location: [place];  Service: Vascular;  Laterality: Right;   AMPUTATION Right 03/22/2022   Procedure: AMPUTATION ABOVE KNEE;  Surgeon: Waynetta Sandy, MD;  Location: [place];  Service: Vascular;  Laterality: Right;   BYPASS GRAFT FEMORAL-PERONEAL Right 02/17/2022   Procedure: Right  BYPASS GRAFT FEMORAL-PERONEAL;  Surgeon: Waynetta Sandy, MD;  Location: [place];  Service: Vascular;  Laterality: Right;   ENDARTERECTOMY FEMORAL Right 02/17/2022   Procedure: ENDARTERECTOMY FEMORAL RIGHT;  Surgeon: Waynetta Sandy, MD;  Location: [place];  Service: Vascular;  Laterality: Right;   INSERTION OF ILIAC STENT  02/17/2022   Procedure: INSERTION OF PERONEAL STENT;  Surgeon: Waynetta Sandy, MD;  Location: [place];  Service: Vascular;;   LOWER EXTREMITY ANGIOGRAM  02/17/2022   Procedure: LOWER EXTREMITY ANGIOGRAM;  Surgeon: Waynetta Sandy, MD;  Location: [place];  Service: Vascular;;    Allergies: Pork-derived products and Shellfish-derived products  Medications: Prior to Admission medications   Medication Sig Start Date End Date Taking? Authorizing Provider  atorvastatin (LIPITOR) 80 MG tablet Take 1 tablet (80 mg total) by mouth at bedtime. 12/10/21  Yes [name], NP  carvedilol (COREG) 12.5 MG tablet Take 1 tablet (12.5 mg total) by mouth 2 (two) times daily with a meal. Patient taking differently: Take 12.5 mg by mouth in the morning. 02/08/22  Yes Rizwan, Eunice Blase, MD  Dulaglutide (TRULICITY) [id number] [id number] SOPN Inject 0.75 mg into the skin once a week. Patient taking differently: Inject 0.75 mg into the skin every Monday. 12/08/21  Yes [name], NP  dutasteride (AVODART) 0.5 MG capsule Take 1 capsule (0.5 mg total) by mouth daily. Patient taking differently: Take 0.5 mg by mouth in the morning. 12/20/21  Yes Charlynne Cousins, MD  losartan (COZAAR) 50 MG tablet Take 1 tablet (50 mg total) by mouth daily. 12/20/21  Yes Charlynne Cousins, MD  metFORMIN (GLUCOPHAGE) 1000 MG tablet Take 1 tablet (1,000 mg total) by mouth 2 (two) times  daily with a meal. Patient taking differently: Take 1,000 mg by mouth 2 (two) times daily. 04/19/21 07/27/22 Yes [name], NP  Multiple Vitamin (MULTIVITAMIN WITH MINERALS) TABS tablet  Take 1 tablet by mouth daily. 03/29/21  Yes Aline August, MD  Nutritional Supplements (RESOURCE 2.0) LIQD Take 120 mLs by mouth 2 (two) times daily.   Yes [provider]  pantoprazole (PROTONIX) 40 MG tablet Take 1 tablet (40 mg total) by mouth 2 (two) times daily. 12/20/21  Yes Charlynne Cousins, MD  tamsulosin (FLOMAX) 0.4 MG CAPS capsule Take 2 capsules (0.8 mg total) by mouth daily. Patient taking differently: Take 0.8 mg by mouth in the morning. 12/20/21  Yes Charlynne Cousins, MD  thiamine (VITAMIN B1) 100 MG tablet Take 1 tablet (100 mg total) by mouth daily. Patient taking differently: Take 100 mg by mouth in the morning. 02/08/22  Yes Debbe Odea, MD  acetaminophen (TYLENOL) 325 MG tablet Take 650 mg by mouth every 4 (four) hours as needed (pain).    [provider]  aspirin 325 MG tablet Take 1 tablet (325 mg total) by mouth daily. Patient taking differently: Take 325 mg by mouth in the morning. 02/09/22   Debbe Odea, MD  clopidogrel (PLAVIX) 75 MG tablet Take 1 tablet (75 mg total) by mouth daily at 6 (six) AM. Patient not taking: Reported on 03/22/2022 02/26/22   Baglia, Corrina, PA-C  insulin aspart (NOVOLOG) 100 UNIT/ML injection Inject 0-15 Units into the skin 3 (three) times daily with meals. CBG 70 - 120: 0 units  CBG 121 - 150: 2 units  CBG 151 - 200: 3 units  CBG 201 - 250: 5 units  CBG 251 - 300: 8 units  CBG 301 - 350: 11 units  CBG 351 - 400: 15 units Patient not taking: Reported on 03/22/2022 02/08/22   Debbe Odea, MD  insulin lispro (HUMALOG) 100 UNIT/ML injection Inject 0-15 Units into the skin See admin instructions. Inject 0-15 units twice daily per sliding scale: CBG 70-120 : 0 units CBG 121-150 : 2 units CBG 151-200 : 3 units CBG 201-250 : 5 units CBG 251-300 : 8 units CBG 301-350 : 11 units CBG 351-400 : 15 units    [provider]  povidone-iodine (BETADINE) 10 % ointment Apply 1 Application topically daily. To necrotic  area of right foot    [provider]  senna-docusate (SENOKOT-S) 8.6-50 MG tablet Take 1 tablet by mouth at bedtime as needed for mild constipation. Patient taking differently: Take 1 tablet by mouth at bedtime as needed (constipation). 02/08/22   Debbe Odea, MD     Family History  Problem Relation Age of Onset   Stroke Mother     Social History   Socioeconomic History   Marital status: Single    Spouse name: Not on file   Number of children: Not on file   Years of education: Not on file   Highest education level: Not on file  Occupational History   Not on file  Tobacco Use   Smoking status: Former    Packs/day: .5    Types: Cigarettes    Quit date: 12/07/2021    Years since quitting: 0.6   Smokeless tobacco: Never  Vaping Use   Vaping Use: Never used  Substance and Sexual Activity   Alcohol use: Yes   Drug use: Yes    Types: Marijuana   Sexual activity: Not Currently  Other Topics Concern   Not on file  Social History Narrative   Not on file   Social Determinants of Health   Financial Resource Strain: Not on file  Food Insecurity: No Food Insecurity (03/22/2022)   Hunger Vital Sign    Worried About Running Out of Food in the Last Year: Never true    Ran Out of Food in the Last Year: Never true  Transportation Needs: No Transportation Needs (03/22/2022)   PRAPARE - Hydrologist (Medical): No    Lack of Transportation (Non-Medical): No  Physical Activity: Not on file  Stress: Not on file  Social Connections: Not on file     Review of Systems: A 12 point ROS discussed and pertinent positives are indicated in the HPI above.  All other systems are negative.  Review of Systems  Unable to perform ROS: Patient nonverbal    Vital Signs: BP (!) 177/92   Pulse 84   Temp (!) 97.1 F (36.2 C) (Temporal)   Resp 18   Ht 5\' 4"  (1.626 m)   Wt 185 lb (83.9 kg)   SpO2 99%   BMI 31.76 kg/m   Physical Exam Vitals reviewed.   Constitutional:      General: He is not in acute distress. HENT:     Head: Normocephalic.     Mouth/Throat:     Mouth: Mucous membranes are dry.     Pharynx: Oropharynx is clear. No oropharyngeal exudate or posterior oropharyngeal erythema.  Cardiovascular:     Rate and Rhythm: Normal rate and regular rhythm.  Pulmonary:     Effort: Pulmonary effort is normal.     Breath sounds: Normal breath sounds.  Abdominal:     Palpations: Abdomen is soft.  Genitourinary:    Comments: (+) foley draining cloudy yellow urine Skin:    General: Skin is warm and dry.  Neurological:     Mental Status: He is alert.      MD Evaluation Airway: WNL Heart: WNL Abdomen: WNL Chest/ Lungs: WNL ASA  Classification: 3 Mallampati/Airway Score: Two   Imaging: No results found.  Labs:  CBC: Recent Labs    03/24/22 0318 03/25/22 1233 07/19/22 0835 07/27/22 0835  WBC 11.9* 9.9 6.0 5.4  HGB 10.1* 9.5* 11.1* 11.0*  HCT 30.3* 28.2* 33.3* 34.8*  PLT 318 303 273 274    COAGS: Recent Labs    02/17/22 0552 03/22/22 1119 07/19/22 0835 07/27/22 0835  INR 1.0 1.2 1.0 1.0  APTT 29 34  --   --     BMP: Recent Labs    02/22/22 0103 03/22/22 1119 03/23/22 0349 03/24/22 1108  NA 142 141 138 138  K 3.6 2.9* 3.8 3.5  CL 110 103 105 106  CO2 22 25 24 24   GLUCOSE 141* 206* 111* 117*  BUN 28* 12 16 15   CALCIUM 8.7* 7.5* 6.3* 6.6*  CREATININE 1.02 1.18 1.06 1.02  GFRNONAA >60 >60 >60 >60    LIVER FUNCTION TESTS: Recent Labs    02/01/22 0344 02/17/22 0552 03/22/22 1119 03/23/22 0349  BILITOT 0.4 0.5 0.4 0.4  AST 10* 11* 23 19  ALT 9 10 14 11   ALKPHOS 70 73 79 62  PROT 6.2* 7.1 7.0 5.6*  ALBUMIN 2.9* 3.0* 2.4* 1.8*    TUMOR MARKERS: No results for input(s): "AFPTM", "CEA", "CA199", "CHROMGRNA" in the last 8760 hours.  Assessment and Plan:  57 y/o M with history of urinary retention and areflexic bladder with chronic indwelling foley catheter who presents today for  suprapubic catheter placement.  Risks and benefits discussed with the patient's nice Willey Blade via phone including bleeding, infection, damage to adjacent structures, bowel perforation/fistula connection, and sepsis.  All of the patient's niece's questions were answered, patient's niece is agreeable to proceed.  Consent signed and in chart.  Thank you for this interesting consult.  I greatly enjoyed meeting Tron Brenden and look forward to participating in their care.  A copy of this report was sent to the requesting provider on this date.  Electronically Signed: Joaquim Nam, PA-C 07/27/2022, 9:13 AM   I spent a total of  30 Minutes  in face to face in clinical consultation, greater than 50% of which was counseling/coordinating care for urinary retention.

## 2022-07-27 NOTE — Procedures (Signed)
Interventional Radiology Procedure Note  Procedure: Suprapubic catheter  Indication: Urinary Retention  Findings: Please refer to procedural dictation for full description.  Complications: None  EBL: < 10 mL  Miachel Roux, MD [phone]

## 2022-07-27 NOTE — Progress Notes (Signed)
Oozing from site held pressure for 5 min, bp elevated dr Dwaine Gale notified

## 2022-08-03 ENCOUNTER — Ambulatory Visit (HOSPITAL_COMMUNITY)
Admission: RE | Admit: 2022-08-03 | Discharge: 2022-08-03 | Disposition: A | Discharge status: Home / Self Care | Payer: Medicaid Other | Source: Ambulatory Visit | Attending: Vascular Surgery | Admitting: Vascular Surgery

## 2022-08-03 ENCOUNTER — Ambulatory Visit (INDEPENDENT_AMBULATORY_CARE_PROVIDER_SITE_OTHER): Payer: Medicaid Other | Admitting: Physician Assistant

## 2022-08-03 VITALS — BP 190/91 | HR 90 | Resp 16 | Ht 65.0 in | Wt 175.0 lb

## 2022-08-03 DIAGNOSIS — I739 Peripheral vascular disease, unspecified: Secondary | ICD-10-CM | POA: Diagnosis not present

## 2022-08-03 DIAGNOSIS — Z89611 Acquired absence of right leg above knee: Secondary | ICD-10-CM

## 2022-08-03 NOTE — Progress Notes (Signed)
Office Note   History of Present Illness   Ryan Hughes is a 57 y.o. (08/12/1965) male who presents for surveillance of PAD.  He recently underwent right AKA on 03/22/2022 by Dr. Donzetta Matters.  This was done for right lower extremity tissue loss with no options for revascularization.  He currently resides at [facility].  He returns today for LLE PAD surveillance.  He denies any pain in his left leg.  A CNA is present with him for today's visit and states that a pressure ulcer has formed on the bottom of his left heel.  It is not infected and does not cause him any pain.  The nursing facility does daily dressing changes to the wound.  They also try to offload his heels in bed.  He is nonambulatory.  Current Outpatient Medications  Medication Sig Dispense Refill   acetaminophen (TYLENOL) 325 MG tablet Take 650 mg by mouth every 4 (four) hours as needed (pain).     aspirin 325 MG tablet Take 1 tablet (325 mg total) by mouth daily. (Patient taking differently: Take 325 mg by mouth in the morning.)     atorvastatin (LIPITOR) 80 MG tablet Take 1 tablet (80 mg total) by mouth at bedtime. 90 tablet 0   carvedilol (COREG) 12.5 MG tablet Take 1 tablet (12.5 mg total) by mouth 2 (two) times daily with a meal. (Patient taking differently: Take 12.5 mg by mouth in the morning.)     Dulaglutide (TRULICITY) [id number] 0000000 SOPN Inject 0.75 mg into the skin once a week. (Patient taking differently: Inject 0.75 mg into the skin every Monday.) 2 mL 3   Nutritional Supplements (RESOURCE 2.0) LIQD Take 120 mLs by mouth 2 (two) times daily.     senna-docusate (SENOKOT-S) 8.6-50 MG tablet Take 1 tablet by mouth at bedtime as needed for mild constipation. (Patient taking differently: Take 1 tablet by mouth at bedtime as needed (constipation).)     tamsulosin (FLOMAX) 0.4 MG CAPS capsule Take 2 capsules (0.8 mg total) by mouth daily. (Patient taking differently: Take 0.8 mg by mouth in the morning.) 30 capsule 3    thiamine (VITAMIN B1) 100 MG tablet Take 1 tablet (100 mg total) by mouth daily. (Patient taking differently: Take 100 mg by mouth in the morning.) 30 tablet 0   clopidogrel (PLAVIX) 75 MG tablet Take 1 tablet (75 mg total) by mouth daily at 6 (six) AM. (Patient not taking: Reported on 03/22/2022) 30 tablet 2   dutasteride (AVODART) 0.5 MG capsule Take 1 capsule (0.5 mg total) by mouth daily. (Patient taking differently: Take 0.5 mg by mouth in the morning.) 30 capsule 3   insulin aspart (NOVOLOG) 100 UNIT/ML injection Inject 0-15 Units into the skin 3 (three) times daily with meals. CBG 70 - 120: 0 units  CBG 121 - 150: 2 units  CBG 151 - 200: 3 units  CBG 201 - 250: 5 units  CBG 251 - 300: 8 units  CBG 301 - 350: 11 units  CBG 351 - 400: 15 units (Patient not taking: Reported on 03/22/2022) 10 mL 11   insulin lispro (HUMALOG) 100 UNIT/ML injection Inject 0-15 Units into the skin See admin instructions. Inject 0-15 units twice daily per sliding scale: CBG 70-120 : 0 units CBG 121-150 : 2 units CBG 151-200 : 3 units CBG 201-250 : 5 units CBG 251-300 : 8 units CBG 301-350 : 11 units CBG 351-400 : 15 units     losartan (COZAAR) 50 MG  tablet Take 1 tablet (50 mg total) by mouth daily. (Patient not taking: Reported on 08/03/2022) 30 tablet 3   metFORMIN (GLUCOPHAGE) 1000 MG tablet Take 1 tablet (1,000 mg total) by mouth 2 (two) times daily with a meal. (Patient taking differently: Take 1,000 mg by mouth 2 (two) times daily.) 180 tablet 1   Multiple Vitamin (MULTIVITAMIN WITH MINERALS) TABS tablet Take 1 tablet by mouth daily. (Patient not taking: Reported on 08/03/2022) 30 tablet 0   pantoprazole (PROTONIX) 40 MG tablet Take 1 tablet (40 mg total) by mouth 2 (two) times daily. (Patient not taking: Reported on 08/03/2022) 603 tablet 0   povidone-iodine (BETADINE) 10 % ointment Apply 1 Application topically daily. To necrotic area of right foot (Patient not taking: Reported on 08/03/2022)     No  current facility-administered medications for this visit.    REVIEW OF SYSTEMS (negative unless checked):   Cardiac:  []  Chest pain or chest pressure? []  Shortness of breath upon activity? []  Shortness of breath when lying flat? []  Irregular heart rhythm?  Vascular:  []  Pain in calf, thigh, or hip brought on by walking? []  Pain in feet at night that wakes you up from your sleep? []  Blood clot in your veins? []  Leg swelling?  Pulmonary:  []  Oxygen at home? []  Productive cough? []  Wheezing?  Neurologic:  []  Sudden weakness in arms or legs? []  Sudden numbness in arms or legs? []  Sudden onset of difficult speaking or slurred speech? []  Temporary loss of vision in one eye? []  Problems with dizziness?  Gastrointestinal:  []  Blood in stool? []  Vomited blood?  Genitourinary:  []  Burning when urinating? []  Blood in urine?  Psychiatric:  []  Major depression  Hematologic:  []  Bleeding problems? []  Problems with blood clotting?  Dermatologic:  [x]  Rashes or ulcers?  Constitutional:  []  Fever or chills?  Ear/Nose/Throat:  []  Change in hearing? []  Nose bleeds? []  Sore throat?  Musculoskeletal:  []  Back pain? []  Joint pain? []  Muscle pain?   Physical Examination   Vitals:   08/03/22 1316  Resp: 16  SpO2: 100%  Weight: 175 lb (79.4 kg)  Height: 5\' 5"  (1.651 m)   Body mass index is 29.12 kg/m.  General:  WDWN in NAD; vital signs documented above Gait: Not observed HENT: WNL, normocephalic Pulmonary: normal non-labored breathing Cardiac: Regular Abdomen: soft, NT, no masses Skin: without rashes Vascular Exam/Pulses: Left lower extremity dampened monophasic PT/DP Doppler signals Extremities: Pressure wound of the left heel without erythema or signs of infection.  No necrotic tissue.  Healed right AKA Musculoskeletal: no muscle wasting or atrophy  Neurologic: A&O X 3;  No focal weakness or paresthesias are detected Psychiatric:  The pt has Normal  affect.  Non-Invasive Vascular imaging   ABI (08/03/2022) L:  ABI: [place] ([place]),  PT: mono DP: mono TBI: 0.25  R AKA   Medical Decision Making   Ryan Hughes is a 57 y.o. male who presents for surveillance of PAD  Based on the patient's vascular studies, he has severely diminished flow in his left lower extremity.  He has dampened monophasic signals in the left lower extremity with a TBI of 0.25 Per the patient's CNA present today, he has developed a small pressure wound on the bottom of his left heel.  The wound is not infected and has no necrotic tissue.  The patient is nursing facility keep this area clean and changes a bandage on a daily.  They also offload his heel in a  boot.  Since the patient has greatly diminished flow in his left lower extremity and is nonambulatory, he would likely have no revascularization options if his left heel wound worsened.  I have told the patient he would likely require left AKA if his heel wound significantly worsened/got infected or caused him unbearable pain. For this time I have recommended that the patient's facility continue to keep his wound clean and dry with daily dressing changes.  They should also continue offloading his heel while in bed.   He can follow-up with our office in 1 year with ABIs, or sooner if his wound significantly worsens   Vicente Serene PA-C [facility] Office: [phone]  Call MD: Scot Dock

## 2022-08-04 LAB — VAS US ABI WITH/WO TBI

## 2022-08-26 ENCOUNTER — Other Ambulatory Visit (HOSPITAL_COMMUNITY): Payer: Self-pay | Admitting: Family Medicine

## 2022-08-26 DIAGNOSIS — R339 Retention of urine, unspecified: Secondary | ICD-10-CM

## 2022-09-16 ENCOUNTER — Other Ambulatory Visit: Payer: Self-pay | Admitting: Radiology

## 2022-09-16 DIAGNOSIS — N401 Enlarged prostate with lower urinary tract symptoms: Secondary | ICD-10-CM

## 2022-09-19 ENCOUNTER — Other Ambulatory Visit: Payer: Self-pay

## 2022-09-19 ENCOUNTER — Ambulatory Visit (HOSPITAL_COMMUNITY)
Admission: RE | Admit: 2022-09-19 | Discharge: 2022-09-19 | Disposition: A | Discharge status: Home / Self Care | Payer: Medicaid Other | Source: Ambulatory Visit | Attending: Nurse Practitioner | Admitting: Nurse Practitioner

## 2022-09-19 DIAGNOSIS — Z89611 Acquired absence of right leg above knee: Secondary | ICD-10-CM | POA: Diagnosis not present

## 2022-09-19 DIAGNOSIS — R339 Retention of urine, unspecified: Secondary | ICD-10-CM | POA: Diagnosis present

## 2022-09-19 DIAGNOSIS — Z87891 Personal history of nicotine dependence: Secondary | ICD-10-CM | POA: Diagnosis not present

## 2022-09-19 DIAGNOSIS — N401 Enlarged prostate with lower urinary tract symptoms: Secondary | ICD-10-CM

## 2022-09-19 HISTORY — PX: IR CATHETER TUBE CHANGE: IMG717

## 2022-09-19 LAB — GLUCOSE, CAPILLARY: Glucose-Capillary: 121 mg/dL — ABNORMAL HIGH (ref 70–99)

## 2022-09-19 LAB — CBC
HCT: 32.8 % — ABNORMAL LOW (ref 39.0–52.0)
Hemoglobin: 10.5 g/dL — ABNORMAL LOW (ref 13.0–17.0)
MCH: 29.3 pg (ref 26.0–34.0)
MCHC: 32 g/dL (ref 30.0–36.0)
MCV: 91.6 fL (ref 80.0–100.0)
Platelets: 302 10*3/uL (ref 150–400)
RBC: 3.58 MIL/uL — ABNORMAL LOW (ref 4.22–5.81)
RDW: 14.6 % (ref 11.5–15.5)
WBC: 6.7 10*3/uL (ref 4.0–10.5)
nRBC: 0 % (ref 0.0–0.2)

## 2022-09-19 LAB — PROTIME-INR
INR: 1 (ref 0.8–1.2)
Prothrombin Time: 13.6 seconds (ref 11.4–15.2)

## 2022-09-19 MED ORDER — FENTANYL CITRATE (PF) 100 MCG/2ML IJ SOLN
INTRAMUSCULAR | Status: AC | PRN
  Administered 2022-09-19: 25 ug via INTRAVENOUS

## 2022-09-19 MED ORDER — SODIUM CHLORIDE 0.9 % IV SOLN: INTRAVENOUS | Status: DC

## 2022-09-19 MED ORDER — MIDAZOLAM HCL 2 MG/2ML IJ SOLN
INTRAMUSCULAR | Status: AC | PRN
  Administered 2022-09-19: 1 mg via INTRAVENOUS

## 2022-09-19 MED ORDER — LIDOCAINE HCL 1 % IJ SOLN
INTRAMUSCULAR | Status: AC
  Filled 2022-09-19: qty 20

## 2022-09-19 MED ORDER — FENTANYL CITRATE (PF) 100 MCG/2ML IJ SOLN
INTRAMUSCULAR | Status: AC
  Filled 2022-09-19: qty 2

## 2022-09-19 MED ORDER — MUPIROCIN 2 % EX OINT: 1.0000 | TOPICAL_OINTMENT | Freq: Two times a day (BID) | CUTANEOUS | Status: DC

## 2022-09-19 MED ORDER — MIDAZOLAM HCL 2 MG/2ML IJ SOLN
INTRAMUSCULAR | Status: AC
  Filled 2022-09-19: qty 2

## 2022-09-19 NOTE — H&P (Signed)
Chief Complaint: Patient was seen in consultation today for No chief complaint on file.  at the request of Hillery Aldo  Referring Physician(s): Hillery Aldo  Supervising Physician: Oley Balm  Patient Status: [facility] - Out-pt  History of Present Illness: Ryan Hughes is a 57 y.o. male outpatient. Smoker. History of  CVA, HTN, HLD, PAD s/p AKA, DM, BPH and areflexic bladder  with urinary retention. IR placed a suprapubic catheter on 3.20.24 16 Fr. Patient presents for conversion to Councill tip.  Patient alert and laying in bed,calm. Denies any fevers, headache, chest pain, SOB, cough, abdominal pain, nausea, vomiting or bleeding. Staff member at bedside states the catheter is leaking intermittently. Return precautions and treatment recommendations and follow-up discussed with the patient  who is agreeable with the plan.      Past Medical History:  Diagnosis Date   Acute arterial ischemic stroke, vertebrobasilar, thalamic, left (HCC) 04/25/2020   Acute CVA (cerebrovascular accident) (HCC) 03/27/2021   BPH (benign prostatic hyperplasia)    Diabetes mellitus without complication (HCC)    HLD (hyperlipidemia)    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   Hypertension    Nicotine dependence, cigarettes, uncomplicated 04/25/2020   Stroke [facility])     Past Surgical History:  Procedure Laterality Date   ABDOMINAL AORTOGRAM W/LOWER EXTREMITY N/A 02/02/2022   Procedure: ABDOMINAL AORTOGRAM W/LOWER EXTREMITY;  Surgeon: Victorino Sparrow, MD;  Location: [facility] INVASIVE CV LAB;  Service: Cardiovascular;  Laterality: N/A;   AMPUTATION Right 02/17/2022   Procedure: AMPUTATION DIGIT GREAT AND SECOND TOES;  Surgeon: Maeola Harman, MD;  Location: [facility] OR;  Service: Vascular;  Laterality: Right;   AMPUTATION Right 03/22/2022   Procedure: AMPUTATION ABOVE KNEE;  Surgeon: Maeola Harman, MD;  Location: [facility] OR;  Service: Vascular;  Laterality:  Right;   BYPASS GRAFT FEMORAL-PERONEAL Right 02/17/2022   Procedure: Right BYPASS GRAFT FEMORAL-PERONEAL;  Surgeon: Maeola Harman, MD;  Location: [facility] OR;  Service: Vascular;  Laterality: Right;   ENDARTERECTOMY FEMORAL Right 02/17/2022   Procedure: ENDARTERECTOMY FEMORAL RIGHT;  Surgeon: Maeola Harman, MD;  Location: [facility] OR;  Service: Vascular;  Laterality: Right;   INSERTION OF ILIAC STENT  02/17/2022   Procedure: INSERTION OF PERONEAL STENT;  Surgeon: Maeola Harman, MD;  Location: [facility] OR;  Service: Vascular;;   LOWER EXTREMITY ANGIOGRAM  02/17/2022   Procedure: LOWER EXTREMITY ANGIOGRAM;  Surgeon: Maeola Harman, MD;  Location: [facility] OR;  Service: Vascular;;    Allergies: Pork-derived products and Shellfish-derived products  Medications: Prior to Admission medications   Medication Sig Start Date End Date Taking? Authorizing Provider  carvedilol (COREG) 12.5 MG tablet Take 1 tablet (12.5 mg total) by mouth 2 (two) times daily with a meal. Patient taking differently: Take 12.5 mg by mouth in the morning. 02/08/22  Yes Calvert Cantor, MD  insulin aspart (NOVOLOG) 100 UNIT/ML injection Inject 0-15 Units into the skin 3 (three) times daily with meals. CBG 70 - 120: 0 units  CBG 121 - 150: 2 units  CBG 151 - 200: 3 units  CBG 201 - 250: 5 units  CBG 251 - 300: 8 units  CBG 301 - 350: 11 units  CBG 351 - 400: 15 units 02/08/22  Yes Rizwan, Saima, MD  insulin lispro (HUMALOG) 100 UNIT/ML injection Inject 0-15 Units into the skin See admin instructions. Inject 0-15 units twice daily per sliding scale: CBG 70-120 : 0 units CBG 121-150 : 2 units CBG 151-200 : 3 units CBG 201-250 :  5 units CBG 251-300 : 8 units CBG 301-350 : 11 units CBG 351-400 : 15 units   Yes [provider]  losartan (COZAAR) 50 MG tablet Take 1 tablet (50 mg total) by mouth daily. 12/20/21  Yes Marinda Elk, MD  acetaminophen (TYLENOL) 325 MG tablet Take 650 mg by  mouth every 4 (four) hours as needed (pain).    [provider]  aspirin 325 MG tablet Take 1 tablet (325 mg total) by mouth daily. Patient taking differently: Take 325 mg by mouth in the morning. 02/09/22   Calvert Cantor, MD  atorvastatin (LIPITOR) 80 MG tablet Take 1 tablet (80 mg total) by mouth at bedtime. 12/10/21   Grayce Sessions, NP  clopidogrel (PLAVIX) 75 MG tablet Take 1 tablet (75 mg total) by mouth daily at 6 (six) AM. Patient not taking: Reported on 03/22/2022 02/26/22   Baglia, Corrina, PA-C  Dulaglutide (TRULICITY) 0.75 MG/0.5ML SOPN Inject 0.75 mg into the skin once a week. Patient taking differently: Inject 0.75 mg into the skin every Monday. 12/08/21   Grayce Sessions, NP  dutasteride (AVODART) 0.5 MG capsule Take 1 capsule (0.5 mg total) by mouth daily. Patient taking differently: Take 0.5 mg by mouth in the morning. 12/20/21   Marinda Elk, MD  metFORMIN (GLUCOPHAGE) 1000 MG tablet Take 1 tablet (1,000 mg total) by mouth 2 (two) times daily with a meal. Patient taking differently: Take 1,000 mg by mouth 2 (two) times daily. 04/19/21 07/27/22  Grayce Sessions, NP  Multiple Vitamin (MULTIVITAMIN WITH MINERALS) TABS tablet Take 1 tablet by mouth daily. Patient not taking: Reported on 08/03/2022 03/29/21   Glade Lloyd, MD  Nutritional Supplements (RESOURCE 2.0) LIQD Take 120 mLs by mouth 2 (two) times daily.    [provider]  pantoprazole (PROTONIX) 40 MG tablet Take 1 tablet (40 mg total) by mouth 2 (two) times daily. Patient not taking: Reported on 08/03/2022 12/20/21   Marinda Elk, MD  povidone-iodine (BETADINE) 10 % ointment Apply 1 Application topically daily. To necrotic area of right foot Patient not taking: Reported on 08/03/2022    [provider]  senna-docusate (SENOKOT-S) 8.6-50 MG tablet Take 1 tablet by mouth at bedtime as needed for mild constipation. Patient taking differently: Take 1 tablet by mouth at bedtime as  needed (constipation). 02/08/22   Calvert Cantor, MD  tamsulosin (FLOMAX) 0.4 MG CAPS capsule Take 2 capsules (0.8 mg total) by mouth daily. Patient taking differently: Take 0.8 mg by mouth in the morning. 12/20/21   Marinda Elk, MD  thiamine (VITAMIN B1) 100 MG tablet Take 1 tablet (100 mg total) by mouth daily. Patient taking differently: Take 100 mg by mouth in the morning. 02/08/22   Calvert Cantor, MD     Family History  Problem Relation Age of Onset   Stroke Mother     Social History   Socioeconomic History   Marital status: Single    Spouse name: Not on file   Number of children: Not on file   Years of education: Not on file   Highest education level: Not on file  Occupational History   Not on file  Tobacco Use   Smoking status: Former    Packs/day: .5    Types: Cigarettes    Quit date: 12/07/2021    Years since quitting: 0.7   Smokeless tobacco: Never  Vaping Use   Vaping Use: Never used  Substance and Sexual Activity   Alcohol use: Yes  Drug use: Yes    Types: Marijuana   Sexual activity: Not Currently  Other Topics Concern   Not on file  Social History Narrative   Not on file   Social Determinants of Health   Financial Resource Strain: Not on file  Food Insecurity: No Food Insecurity (03/22/2022)   Hunger Vital Sign    Worried About Running Out of Food in the Last Year: Never true    Ran Out of Food in the Last Year: Never true  Transportation Needs: No Transportation Needs (03/22/2022)   PRAPARE - Administrator, Civil Service (Medical): No    Lack of Transportation (Non-Medical): No  Physical Activity: Not on file  Stress: Not on file  Social Connections: Not on file    Review of Systems: A 12 point ROS discussed and pertinent positives are indicated in the HPI above.  All other systems are negative.  Review of Systems  Constitutional:  Negative for fever.  HENT:  Negative for congestion.   Respiratory:  Negative for cough and  shortness of breath.   Cardiovascular:  Negative for chest pain.  Gastrointestinal:  Negative for abdominal pain.  Neurological:  Negative for headaches.  Psychiatric/Behavioral:  Negative for behavioral problems and confusion.     Vital Signs: BP 134/82   Pulse 80   Temp 98.6 F (37 C) (Oral)   Resp 18   Ht 5\' 6"  (1.676 m)   Wt 200 lb (90.7 kg)   SpO2 98%   BMI 32.28 kg/m     Physical Exam Vitals and nursing note reviewed.  Constitutional:      Appearance: He is well-developed. He is ill-appearing.  HENT:     Head: Normocephalic.  Pulmonary:     Effort: Pulmonary effort is normal.  Genitourinary:    Comments: Supra pubic catheter present. clamped Musculoskeletal:        General: Normal range of motion.     Cervical back: Normal range of motion.  Skin:    General: Skin is warm and dry.  Neurological:     General: No focal deficit present.     Mental Status: He is alert and oriented to person, place, and time.  Psychiatric:        Mood and Affect: Mood normal.        Behavior: Behavior normal.        Thought Content: Thought content normal.        Judgment: Judgment normal.     Imaging: No results found.  Labs:  CBC: Recent Labs    03/24/22 0318 03/25/22 1233 07/19/22 0835 07/27/22 0835  WBC 11.9* 9.9 6.0 5.4  HGB 10.1* 9.5* 11.1* 11.0*  HCT 30.3* 28.2* 33.3* 34.8*  PLT 318 303 273 274    COAGS: Recent Labs    02/17/22 0552 03/22/22 1119 07/19/22 0835 07/27/22 0835  INR 1.0 1.2 1.0 1.0  APTT 29 34  --   --     BMP: Recent Labs    02/22/22 0103 03/22/22 1119 03/23/22 0349 03/24/22 1108  NA 142 141 138 138  K 3.6 2.9* 3.8 3.5  CL 110 103 105 106  CO2 22 25 24 24   GLUCOSE 141* 206* 111* 117*  BUN 28* 12 16 15   CALCIUM 8.7* 7.5* 6.3* 6.6*  CREATININE 1.02 1.18 1.06 1.02  GFRNONAA >60 >60 >60 >60    LIVER FUNCTION TESTS: Recent Labs    02/01/22 0344 02/17/22 0552 03/22/22 1119 03/23/22 0349  BILITOT 0.4 0.5  0.4 0.4  AST  10* 11* 23 19  ALT 9 10 14 11   ALKPHOS 70 73 79 62  PROT 6.2* 7.1 7.0 5.6*  ALBUMIN 2.9* 3.0* 2.4* 1.8*   Assessment and Plan: 57 y.o. male outpatient. Smoker. History of  CVA, HTN, HLD, PAD s/p AKA, DM, BPH and areflexic bladder  with urinary retention. IR placed a suprapubic catheter on 3.20.24 16 Fr. Patient presents for conversion to Councill tip.  Patient is on ASA 235 mg and plavix 75 mg. No recent labs. No pertinent allergies. Patient has been NPO since midnight.  Risks and benefits discussed with the patient including bleeding, infection, damage to adjacent structures, bowel perforation/fistula connection, and sepsis.  All of the patient's questions were answered, patient is agreeable to proceed.  Consent signed and in chart.     Thank you for this interesting consult.  I greatly enjoyed meeting Kass Weisheit and look forward to participating in their care.  A copy of this report was sent to the requesting provider on this date.  Electronically Signed: Alene Mires, NP 09/19/2022, 10:02 AM   I spent a total of  30 Minutes   in face to face in clinical consultation, greater than 50% of which was counseling/coordinating care for supra pubic catheter exchange and conversion to councill tip

## 2023-10-02 ENCOUNTER — Encounter (HOSPITAL_COMMUNITY): Payer: Self-pay

## 2023-10-02 ENCOUNTER — Other Ambulatory Visit: Payer: Self-pay

## 2023-10-02 ENCOUNTER — Emergency Department (HOSPITAL_COMMUNITY)
Admission: EM | Admit: 2023-10-02 | Discharge: 2023-10-02 | Disposition: A | Discharge status: Skilled Nursing Facility | Attending: Emergency Medicine | Admitting: Emergency Medicine

## 2023-10-02 ENCOUNTER — Emergency Department (HOSPITAL_COMMUNITY)
Admission: EM | Admit: 2023-10-02 | Discharge: 2023-10-02 | Disposition: A | Discharge status: Home / Self Care | Source: Home / Self Care

## 2023-10-02 DIAGNOSIS — I10 Essential (primary) hypertension: Secondary | ICD-10-CM | POA: Insufficient documentation

## 2023-10-02 DIAGNOSIS — Z7982 Long term (current) use of aspirin: Secondary | ICD-10-CM | POA: Insufficient documentation

## 2023-10-02 DIAGNOSIS — Z7984 Long term (current) use of oral hypoglycemic drugs: Secondary | ICD-10-CM | POA: Insufficient documentation

## 2023-10-02 DIAGNOSIS — E119 Type 2 diabetes mellitus without complications: Secondary | ICD-10-CM | POA: Diagnosis not present

## 2023-10-02 DIAGNOSIS — R54 Age-related physical debility: Secondary | ICD-10-CM | POA: Diagnosis not present

## 2023-10-02 DIAGNOSIS — Z89611 Acquired absence of right leg above knee: Secondary | ICD-10-CM | POA: Insufficient documentation

## 2023-10-02 DIAGNOSIS — Z7901 Long term (current) use of anticoagulants: Secondary | ICD-10-CM | POA: Insufficient documentation

## 2023-10-02 DIAGNOSIS — Z87891 Personal history of nicotine dependence: Secondary | ICD-10-CM | POA: Diagnosis not present

## 2023-10-02 DIAGNOSIS — M62421 Contracture of muscle, right upper arm: Secondary | ICD-10-CM | POA: Insufficient documentation

## 2023-10-02 DIAGNOSIS — Z794 Long term (current) use of insulin: Secondary | ICD-10-CM | POA: Insufficient documentation

## 2023-10-02 DIAGNOSIS — Z79899 Other long term (current) drug therapy: Secondary | ICD-10-CM | POA: Diagnosis not present

## 2023-10-02 LAB — CBC WITH DIFFERENTIAL/PLATELET
Abs Immature Granulocytes: 0 10*3/uL (ref 0.00–0.07)
Basophils Absolute: 0 10*3/uL (ref 0.0–0.1)
Basophils Relative: 1 %
Eosinophils Absolute: 0.1 10*3/uL (ref 0.0–0.5)
Eosinophils Relative: 2 %
HCT: 37.8 % — ABNORMAL LOW (ref 39.0–52.0)
Hemoglobin: 11.4 g/dL — ABNORMAL LOW (ref 13.0–17.0)
Immature Granulocytes: 0 %
Lymphocytes Relative: 39 %
Lymphs Abs: 2.2 10*3/uL (ref 0.7–4.0)
MCH: 30.2 pg (ref 26.0–34.0)
MCHC: 30.2 g/dL (ref 30.0–36.0)
MCV: 100 fL (ref 80.0–100.0)
Monocytes Absolute: 0.5 10*3/uL (ref 0.1–1.0)
Monocytes Relative: 10 %
Neutro Abs: 2.8 10*3/uL (ref 1.7–7.7)
Neutrophils Relative %: 48 %
Platelets: 287 10*3/uL (ref 150–400)
RBC: 3.78 MIL/uL — ABNORMAL LOW (ref 4.22–5.81)
RDW: 14.4 % (ref 11.5–15.5)
WBC: 5.7 10*3/uL (ref 4.0–10.5)
nRBC: 0 % (ref 0.0–0.2)

## 2023-10-02 LAB — BASIC METABOLIC PANEL WITH GFR
Anion gap: 8 (ref 5–15)
BUN: 15 mg/dL (ref 6–20)
CO2: 24 mmol/L (ref 22–32)
Calcium: 8.9 mg/dL (ref 8.9–10.3)
Chloride: 109 mmol/L (ref 98–111)
Creatinine, Ser: 1.09 mg/dL (ref 0.61–1.24)
GFR, Estimated: >60 mL/min (ref >60)
Glucose, Bld: 124 mg/dL — ABNORMAL HIGH (ref 70–99)
Potassium: 3.8 mmol/L (ref 3.5–5.1)
Sodium: 141 mmol/L (ref 135–145)

## 2023-10-02 MED ORDER — CARVEDILOL 12.5 MG PO TABS
12.5000 mg | ORAL_TABLET | Freq: Two times a day (BID) | ORAL | Status: DC
  Administered 2023-10-02: 12.5 mg via ORAL
  Filled 2023-10-02: qty 1

## 2023-10-02 MED ORDER — CLONIDINE HCL 0.2 MG PO TABS
0.2000 mg | ORAL_TABLET | Freq: Once | ORAL | Status: AC
  Administered 2023-10-02: 0.2 mg via ORAL
  Filled 2023-10-02: qty 1

## 2023-10-02 NOTE — ED Triage Notes (Signed)
 Coming from [facility] due to high BP.  They sent him here due to not having clonidine. Hasn't taken his nighttime BP meds as well.

## 2023-10-02 NOTE — ED Triage Notes (Addendum)
 Pt arrived from [facility] via [facility]. Per facility pt is hypertensive. Facility states that pt is getting more hypertensive in last 24 hours. Facility denies pt missing any doses of medication. BP at facility 212/110 BP per EMS 194/96

## 2023-10-02 NOTE — Discharge Instructions (Signed)
 You were seen today for high blood pressure.  Your blood pressure trended down without intervention.  Continue blood pressure medications at your facility as previously prescribed.  Your work up appears reassuring.

## 2023-10-02 NOTE — ED Provider Notes (Signed)
 [place] EMERGENCY DEPARTMENT AT [facility] Provider Note   CSN: [id number] Arrival date & time: 10/02/23  0206     History  Chief Complaint  Patient presents with   Hypertension    Ryan Hughes is a 58 y.o. male.  HPI     This is a 58 year old male with a history of diabetes, hypertension, CVA, AKA on the right who presents from his SNF with concerns for hypertension.  Per EMS report, he is reported to have elevated blood pressures over the last 24 hours.  They report that he has been taking his blood pressure medication.  The patient is very difficult to get a history from but does not have any physical complaints.  He is oriented x 2.  Home Medications Prior to Admission medications   Medication Sig Start Date End Date Taking? Authorizing Provider  acetaminophen  (TYLENOL ) 325 MG tablet Take 650 mg by mouth every 4 (four) hours as needed (pain).    [provider]  aspirin  325 MG tablet Take 1 tablet (325 mg total) by mouth daily. Patient taking differently: Take 325 mg by mouth in the morning. 02/09/22   Rizwan, Saima, MD  atorvastatin  (LIPITOR ) 80 MG tablet Take 1 tablet (80 mg total) by mouth at bedtime. 12/10/21   Marius Siemens, NP  carvedilol  (COREG ) 12.5 MG tablet Take 1 tablet (12.5 mg total) by mouth 2 (two) times daily with a meal. Patient taking differently: Take 12.5 mg by mouth in the morning. 02/08/22   Rizwan, Saima, MD  clopidogrel  (PLAVIX ) 75 MG tablet Take 1 tablet (75 mg total) by mouth daily at 6 (six) AM. Patient not taking: Reported on 03/22/2022 02/26/22   Baglia, Corrina, PA-C  Dulaglutide  (TRULICITY ) 0.75 MG/0.5ML SOPN Inject 0.75 mg into the skin once a week. Patient taking differently: Inject 0.75 mg into the skin every Monday. 12/08/21   Marius Siemens, NP  dutasteride  (AVODART ) 0.5 MG capsule Take 1 capsule (0.5 mg total) by mouth daily. Patient taking differently: Take 0.5 mg by mouth in the morning. 12/20/21   Macdonald Savoy, MD  insulin  aspart (NOVOLOG ) 100 UNIT/ML injection Inject 0-15 Units into the skin 3 (three) times daily with meals. CBG 70 - 120: 0 units  CBG 121 - 150: 2 units  CBG 151 - 200: 3 units  CBG 201 - 250: 5 units  CBG 251 - 300: 8 units  CBG 301 - 350: 11 units  CBG 351 - 400: 15 units 02/08/22   Rizwan, Saima, MD  insulin  lispro (HUMALOG ) 100 UNIT/ML injection Inject 0-15 Units into the skin See admin instructions. Inject 0-15 units twice daily per sliding scale: CBG 70-120 : 0 units CBG 121-150 : 2 units CBG 151-200 : 3 units CBG 201-250 : 5 units CBG 251-300 : 8 units CBG 301-350 : 11 units CBG 351-400 : 15 units    [provider]  losartan  (COZAAR ) 50 MG tablet Take 1 tablet (50 mg total) by mouth daily. 12/20/21   Macdonald Savoy, MD  metFORMIN  (GLUCOPHAGE ) 1000 MG tablet Take 1 tablet (1,000 mg total) by mouth 2 (two) times daily with a meal. Patient taking differently: Take 1,000 mg by mouth 2 (two) times daily. 04/19/21 07/27/22  Marius Siemens, NP  Multiple Vitamin (MULTIVITAMIN WITH MINERALS) TABS tablet Take 1 tablet by mouth daily. Patient not taking: Reported on 08/03/2022 03/29/21   Audria Leather, MD  Nutritional Supplements (RESOURCE 2.0) LIQD Take 120 mLs by mouth 2 (  two) times daily.    [provider]  pantoprazole  (PROTONIX ) 40 MG tablet Take 1 tablet (40 mg total) by mouth 2 (two) times daily. Patient not taking: Reported on 08/03/2022 12/20/21   Macdonald Savoy, MD  povidone-iodine  (BETADINE) 10 % ointment Apply 1 Application topically daily. To necrotic area of right foot Patient not taking: Reported on 08/03/2022    [provider]  senna-docusate (SENOKOT-S) 8.6-50 MG tablet Take 1 tablet by mouth at bedtime as needed for mild constipation. Patient taking differently: Take 1 tablet by mouth at bedtime as needed (constipation). 02/08/22   Rizwan, Saima, MD  tamsulosin  (FLOMAX ) 0.4 MG CAPS capsule Take 2 capsules (0.8  mg total) by mouth daily. Patient taking differently: Take 0.8 mg by mouth in the morning. 12/20/21   Macdonald Savoy, MD  thiamine  (VITAMIN B1) 100 MG tablet Take 1 tablet (100 mg total) by mouth daily. Patient taking differently: Take 100 mg by mouth in the morning. 02/08/22   Rizwan, Saima, MD      Allergies    Pork-derived products and Shellfish-derived products    Review of Systems   Review of Systems  Constitutional:  Negative for fever.  Respiratory:  Negative for shortness of breath.   Cardiovascular:  Negative for chest pain.  Gastrointestinal:  Negative for abdominal pain.  All other systems reviewed and are negative.   Physical Exam Updated Vital Signs BP (!) 170/83   Pulse 62   Temp 98.4 F (36.9 C)   Resp 18   Ht 1.676 m (5\' 6" )   Wt 90 kg   SpO2 100%   BMI 32.02 kg/m  Physical Exam Vitals and nursing note reviewed.  Constitutional:      Appearance: He is well-developed.     Comments: Chronically ill-appearing, appears older than stated age  HENT:     Head: Normocephalic and atraumatic.     Mouth/Throat:     Mouth: Mucous membranes are dry.  Eyes:     Pupils: Pupils are equal, round, and reactive to light.  Cardiovascular:     Rate and Rhythm: Normal rate and regular rhythm.     Heart sounds: Normal heart sounds. No murmur heard. Pulmonary:     Effort: Pulmonary effort is normal. No respiratory distress.     Breath sounds: Normal breath sounds. No wheezing.  Abdominal:     Palpations: Abdomen is soft.     Tenderness: There is no abdominal tenderness. There is no rebound.  Musculoskeletal:     Cervical back: Neck supple.     Comments: Contracture of right upper extremity, AKA right lower extremity  Lymphadenopathy:     Cervical: No cervical adenopathy.  Skin:    General: Skin is warm and dry.  Neurological:     Mental Status: He is alert.     Comments: Oriented x 2  Psychiatric:        Mood and Affect: Mood normal.     ED Results /  Procedures / Treatments   Labs (all labs ordered are listed, but only abnormal results are displayed) Labs Reviewed  CBC WITH DIFFERENTIAL/PLATELET - Abnormal; Notable for the following components:      Result Value   RBC 3.78 (*)    Hemoglobin 11.4 (*)    HCT 37.8 (*)    All other components within normal limits  BASIC METABOLIC PANEL WITH GFR - Abnormal; Notable for the following components:   Glucose, Bld 124 (*)    All other components  within normal limits    EKG EKG Interpretation Date/Time:  Monday Oct 02 2023 02:58:18 EDT Ventricular Rate:  69 PR Interval:  138 QRS Duration:  112 QT Interval:  396 QTC Calculation: 424 R Axis:   55  Text Interpretation: Normal sinus rhythm Nonspecific ST abnormality Abnormal ECG When compared with ECG of 14-Dec-2021 20:53, PREVIOUS ECG IS PRESENT Confirmed by Donita Furrow ([id number]) on 10/02/2023 4:10:29 AM  Radiology No results found.  Procedures Procedures    Medications Ordered in ED Medications - No data to display  ED Course/ Medical Decision Making/ A&P                                 Medical Decision Making Amount and/or Complexity of Data Reviewed Labs: ordered.   This patient presents to the ED for concern of high blood pressure, this involves an extensive number of treatment options, and is a complaint that carries with it a high risk of complications and morbidity.  I considered the following differential and admission for this acute, potentially life threatening condition.  The differential diagnosis includes asymptomatic hypertension, hypertensive urgency, hypertensive emergency  MDM:    This is a 58 year old male who presents from his appear somewhat dry.  Will obtain some an EKG.  EKG without acute ischemic changes.  Initial blood pressure 205/91.  This downtrended to 170/83 without intervention.  Basic lab work appears at his baseline.  No renal dysfunction or endorgan damage noted.  Likely asymptomatic  hypertension.  Does not require any intervention right now.  Continue medications at facility as previously directed and have recheck.  (Labs, imaging, consults)  Labs: I Ordered, and personally interpreted labs.  The pertinent results include: CBC, BMP  Imaging Studies ordered: I ordered imaging studies including none I independently visualized and interpreted imaging. I agree with the radiologist interpretation  Additional history obtained from chart review.  External records from outside source obtained and reviewed including prior evaluations  Cardiac Monitoring: The patient was maintained on a cardiac monitor.  If on the cardiac monitor, I personally viewed and interpreted the cardiac monitored which showed an underlying rhythm of: Sinus  Reevaluation: After the interventions noted above, I reevaluated the patient and found that they have :stayed the same  Social Determinants of Health:  lives at SNF  Disposition: Discharge  Co morbidities that complicate the patient evaluation  Past Medical History:  Diagnosis Date   Acute arterial ischemic stroke, vertebrobasilar, thalamic, left (HCC) 04/25/2020   Acute CVA (cerebrovascular accident) (HCC) 03/27/2021   BPH (benign prostatic hyperplasia)    Diabetes mellitus without complication (HCC)    HLD (hyperlipidemia)    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   Hypertension    Nicotine  dependence, cigarettes, uncomplicated 04/25/2020   Stroke (HCC)      Medicines No orders of the defined types were placed in this encounter.   I have reviewed the patients home medicines and have made adjustments as needed  Problem List / ED Course: Problem List Items Addressed This Visit   None Visit Diagnoses       Asymptomatic hypertension    -  Primary                   Final Clinical Impression(s) / ED Diagnoses Final diagnoses:  Asymptomatic hypertension    Rx / DC Orders ED Discharge Orders      None  Rory Collard, MD 10/02/23 [phone]

## 2023-10-02 NOTE — ED Provider Notes (Signed)
 [place] EMERGENCY DEPARTMENT AT [facility] Provider Note   CSN: [id number] Arrival date & time: 10/02/23  1933     History Chief Complaint  Patient presents with   Hypertension    Ryan Hughes is a 58 y.o. 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 The patient was just seen earlier today/last night for this and was discharged home.  Patient is difficult to obtain history from however he can answer yes and no questions and is oriented to person and place.  He denies any chest pain or shortness of breath.  I did call [facility] and spoke with Rosanna.  The marker that was brought in was very confusing as it had 2 different medications mention with 2 separate doses.  Rosanna mentioned that they did not have any clonidine to give him and the patient's blood pressure was still in the 190s systolically so was sent in here because of this.  Rosanna also mentions that the patient is difficult to give his medications to and often will spit them out so she is unsure if he is actually taking the medication or not.  I asked that she bring up the multiple medications listed on the [facility] with her supervisors as needs to be evaluated to make sure that he is getting the appropriate dosing medications.  Also recommended that the patient be supervised to make sure that he takes and swallows his medications completely.  She reports they did not have any clonidine on backup so brought him in to the Emergency Department because of this.   Hypertension Pertinent negatives include no chest pain and no shortness of breath.       Home Medications Prior to Admission medications   Medication Sig Start Date End Date Taking? Authorizing Provider  acetaminophen  (TYLENOL ) 325 MG tablet Take 650 mg by mouth every 4 (four) hours as needed (pain).    [provider]  aspirin  325 MG tablet Take 1 tablet (325 mg total) by  mouth daily. Patient taking differently: Take 325 mg by mouth in the morning. 02/09/22   Rizwan, Saima, MD  atorvastatin  (LIPITOR ) 80 MG tablet Take 1 tablet (80 mg total) by mouth at bedtime. 12/10/21   Marius Siemens, NP  carvedilol  (COREG ) 12.5 MG tablet Take 1 tablet (12.5 mg total) by mouth 2 (two) times daily with a meal. Patient taking differently: Take 12.5 mg by mouth in the morning. 02/08/22   Rizwan, Saima, MD  clopidogrel  (PLAVIX ) 75 MG tablet Take 1 tablet (75 mg total) by mouth daily at 6 (six) AM. Patient not taking: Reported on 03/22/2022 02/26/22   Baglia, Corrina, PA-C  Dulaglutide  (TRULICITY ) 0.75 MG/0.5ML SOPN Inject 0.75 mg into the skin once a week. Patient taking differently: Inject 0.75 mg into the skin every Monday. 12/08/21   Marius Siemens, NP  dutasteride  (AVODART ) 0.5 MG capsule Take 1 capsule (0.5 mg total) by mouth daily. Patient taking differently: Take 0.5 mg by mouth in the morning. 12/20/21   Macdonald Savoy, MD  insulin  aspart (NOVOLOG ) 100 UNIT/ML injection Inject 0-15 Units into the skin 3 (three) times daily with meals. CBG 70 - 120: 0 units  CBG 121 - 150: 2 units  CBG 151 - 200: 3 units  CBG 201 - 250: 5 units  CBG 251 - 300: 8 units  CBG 301 - 350: 11 units  CBG 351 - 400: 15 units 02/08/22  Rizwan, Saima, MD  insulin  lispro (HUMALOG ) 100 UNIT/ML injection Inject 0-15 Units into the skin See admin instructions. Inject 0-15 units twice daily per sliding scale: CBG 70-120 : 0 units CBG 121-150 : 2 units CBG 151-200 : 3 units CBG 201-250 : 5 units CBG 251-300 : 8 units CBG 301-350 : 11 units CBG 351-400 : 15 units    [provider]  losartan  (COZAAR ) 50 MG tablet Take 1 tablet (50 mg total) by mouth daily. 12/20/21   Macdonald Savoy, MD  metFORMIN  (GLUCOPHAGE ) 1000 MG tablet Take 1 tablet (1,000 mg total) by mouth 2 (two) times daily with a meal. Patient taking differently: Take 1,000 mg by mouth 2 (two) times daily. 04/19/21  07/27/22  Marius Siemens, NP  Multiple Vitamin (MULTIVITAMIN WITH MINERALS) TABS tablet Take 1 tablet by mouth daily. Patient not taking: Reported on 08/03/2022 03/29/21   Audria Leather, MD  Nutritional Supplements (RESOURCE 2.0) LIQD Take 120 mLs by mouth 2 (two) times daily.    [provider]  pantoprazole  (PROTONIX ) 40 MG tablet Take 1 tablet (40 mg total) by mouth 2 (two) times daily. Patient not taking: Reported on 08/03/2022 12/20/21   Macdonald Savoy, MD  povidone-iodine  (BETADINE) 10 % ointment Apply 1 Application topically daily. To necrotic area of right foot Patient not taking: Reported on 08/03/2022    [provider]  senna-docusate (SENOKOT-S) 8.6-50 MG tablet Take 1 tablet by mouth at bedtime as needed for mild constipation. Patient taking differently: Take 1 tablet by mouth at bedtime as needed (constipation). 02/08/22   Rizwan, Saima, MD  tamsulosin  (FLOMAX ) 0.4 MG CAPS capsule Take 2 capsules (0.8 mg total) by mouth daily. Patient taking differently: Take 0.8 mg by mouth in the morning. 12/20/21   Macdonald Savoy, MD  thiamine  (VITAMIN B1) 100 MG tablet Take 1 tablet (100 mg total) by mouth daily. Patient taking differently: Take 100 mg by mouth in the morning. 02/08/22   Rizwan, Saima, MD      Allergies    Pork-derived products and Shellfish-derived products    Review of Systems   Review of Systems  Constitutional:  Negative for fever.  Respiratory:  Negative for shortness of breath.   Cardiovascular:  Negative for chest pain.    Physical Exam Updated Vital Signs BP (!) 175/78   Pulse 66   Temp 98.4 F (36.9 C)   Resp (!) 28   Ht 5\' 6"  (1.676 m)   Wt 90 kg   SpO2 100%   BMI 32.02 kg/m  Physical Exam Vitals and nursing note reviewed.  Constitutional:      General: He is not in acute distress.    Comments: Chronically ill-appearing, no acute distress  Eyes:     General: No scleral icterus. Pulmonary:     Effort: Pulmonary  effort is normal. No respiratory distress.  Abdominal:     Palpations: Abdomen is soft.     Tenderness: There is no abdominal tenderness. There is no guarding or rebound.  Musculoskeletal:     Comments: Right arm is in a contracted position with flexed wrist.  AKA on the right.  Skin:    General: Skin is warm and dry.  Neurological:     Mental Status: He is alert.     ED Results / Procedures / Treatments   Labs (all labs ordered are listed, but only abnormal results are displayed) Labs Reviewed - No data to display  EKG None  Radiology No results  found.  Procedures Procedures   Medications Ordered in ED Medications  carvedilol  (COREG ) tablet 12.5 mg (12.5 mg Oral Given 10/02/23 2129)  cloNIDine (CATAPRES) tablet 0.2 mg (0.2 mg Oral Given 10/02/23 2129)    ED Course/ Medical Decision Making/ A&P Clinical Course as of 10/02/23 2157  Mon Oct 02, 2023  2008 Attempt to call [facility].  [RR]    Clinical Course User Index [RR] Spence Dux, PA-C   {  Medical Decision Making Risk Prescription drug management.   58 y.o. male presents to the ER for evaluation of elevated BP without symptoms. Differential diagnosis includes but is not limited to asymptomatic hypertension, poor medical compliance. Vital signs blood pressure 197/89 otherwise unremarkable. Physical exam as noted above.   On previous chart evaluation, patient was seen on 5-26/25 earlier today during the night shift for similar presentation.  Labs were obtained that showed some mild anemia around his baseline without leukocytosis.  BMP showed glucose of 124 otherwise no other abnormalities.  Given the patient labs were obtained less than 24 hours ago with any change of status and patient still remains asymptomatic, do not think these labs need to be redrawn.  After my discussion with [facility], the patient did not receive his nighttime medications.  Rosanna reported the blood pressure has been elevated over the  past few days.  There is some medication discrepancies mentioned on the [facility] which I have discussed with her to alert her appropriate staff as this needs to be addressed and reconciled.  They report that they do not need a new prescription for clonidine they just did not have any other facility which is why he was sent over.  They do not need a prescription of this.  Patient has limited communication however is able to say yes and no and he denies any chest pain or shortness of breath.  I was told by Abbie Abbey that the patient does often spit out his medications so is questioning if he is even taking the medications as given to him.  I discussed with the patient the importance of taking his medications to control his blood pressure.  His blood pressure is now 168/88.  Will discharge back to the facility.  He will need follow up care with his PCP for further blood pressure management.  He is stable for discharge back to the facility.  I discussed this case with my attending physician who cosigned this note including patient's presenting symptoms, physical exam, and planned diagnostics and interventions. Attending physician stated agreement with plan or made changes to plan which were implemented.   Portions of this report may have been transcribed using voice recognition software. Every effort was made to ensure accuracy; however, inadvertent computerized transcription errors may be present.   Final Clinical Impression(s) / ED Diagnoses Final diagnoses:  None    Rx / DC Orders ED Discharge Orders     None         Spence Dux, PA-C 10/02/23 2311    Rolinda Climes, DO 10/03/23 [phone]

## 2023-10-02 NOTE — Discharge Instructions (Signed)
 Please follow up with your primary care provider for blood pressure control.   [facility] ** - his MAR has conflicting medications listed and needs to be reconciled. Please review this with your in house medical providers for accuracy.

## 2024-01-11 ENCOUNTER — Inpatient Hospital Stay (HOSPITAL_COMMUNITY)

## 2024-01-11 ENCOUNTER — Inpatient Hospital Stay (HOSPITAL_COMMUNITY)
Admission: EM | Admit: 2024-01-11 | Discharge: 2024-02-06 | DRG: 698 | Disposition: A | Discharge status: Skilled Nursing Facility | Source: Skilled Nursing Facility | Attending: Internal Medicine | Admitting: Internal Medicine

## 2024-01-11 ENCOUNTER — Encounter (HOSPITAL_COMMUNITY): Payer: Self-pay

## 2024-01-11 ENCOUNTER — Emergency Department (HOSPITAL_COMMUNITY)

## 2024-01-11 DIAGNOSIS — E860 Dehydration: Secondary | ICD-10-CM | POA: Diagnosis not present

## 2024-01-11 DIAGNOSIS — Z9359 Other cystostomy status: Secondary | ICD-10-CM

## 2024-01-11 DIAGNOSIS — L8990 Pressure ulcer of unspecified site, unspecified stage: Secondary | ICD-10-CM | POA: Diagnosis present

## 2024-01-11 DIAGNOSIS — Z6831 Body mass index (BMI) 31.0-31.9, adult: Secondary | ICD-10-CM

## 2024-01-11 DIAGNOSIS — R7989 Other specified abnormal findings of blood chemistry: Secondary | ICD-10-CM | POA: Diagnosis present

## 2024-01-11 DIAGNOSIS — B9620 Unspecified Escherichia coli [E. coli] as the cause of diseases classified elsewhere: Secondary | ICD-10-CM | POA: Diagnosis present

## 2024-01-11 DIAGNOSIS — R1310 Dysphagia, unspecified: Secondary | ICD-10-CM | POA: Diagnosis present

## 2024-01-11 DIAGNOSIS — E870 Hyperosmolality and hypernatremia: Secondary | ICD-10-CM | POA: Diagnosis present

## 2024-01-11 DIAGNOSIS — F32A Depression, unspecified: Secondary | ICD-10-CM | POA: Diagnosis present

## 2024-01-11 DIAGNOSIS — I7090 Unspecified atherosclerosis: Secondary | ICD-10-CM | POA: Diagnosis not present

## 2024-01-11 DIAGNOSIS — N401 Enlarged prostate with lower urinary tract symptoms: Secondary | ICD-10-CM | POA: Diagnosis present

## 2024-01-11 DIAGNOSIS — N390 Urinary tract infection, site not specified: Secondary | ICD-10-CM | POA: Diagnosis present

## 2024-01-11 DIAGNOSIS — E872 Acidosis, unspecified: Secondary | ICD-10-CM | POA: Diagnosis present

## 2024-01-11 DIAGNOSIS — I959 Hypotension, unspecified: Secondary | ICD-10-CM | POA: Diagnosis not present

## 2024-01-11 DIAGNOSIS — K089 Disorder of teeth and supporting structures, unspecified: Secondary | ICD-10-CM

## 2024-01-11 DIAGNOSIS — I6349 Cerebral infarction due to embolism of other cerebral artery: Secondary | ICD-10-CM | POA: Diagnosis not present

## 2024-01-11 DIAGNOSIS — R627 Adult failure to thrive: Secondary | ICD-10-CM | POA: Diagnosis not present

## 2024-01-11 DIAGNOSIS — T83518A Infection and inflammatory reaction due to other urinary catheter, initial encounter: Principal | ICD-10-CM | POA: Diagnosis present

## 2024-01-11 DIAGNOSIS — E66811 Obesity, class 1: Secondary | ICD-10-CM | POA: Diagnosis present

## 2024-01-11 DIAGNOSIS — Z7189 Other specified counseling: Secondary | ICD-10-CM

## 2024-01-11 DIAGNOSIS — D500 Iron deficiency anemia secondary to blood loss (chronic): Secondary | ICD-10-CM | POA: Diagnosis not present

## 2024-01-11 DIAGNOSIS — Z993 Dependence on wheelchair: Secondary | ICD-10-CM

## 2024-01-11 DIAGNOSIS — E1151 Type 2 diabetes mellitus with diabetic peripheral angiopathy without gangrene: Secondary | ICD-10-CM | POA: Diagnosis present

## 2024-01-11 DIAGNOSIS — E8721 Acute metabolic acidosis: Secondary | ICD-10-CM | POA: Diagnosis present

## 2024-01-11 DIAGNOSIS — E861 Hypovolemia: Secondary | ICD-10-CM | POA: Diagnosis not present

## 2024-01-11 DIAGNOSIS — E1159 Type 2 diabetes mellitus with other circulatory complications: Secondary | ICD-10-CM | POA: Diagnosis present

## 2024-01-11 DIAGNOSIS — I6350 Cerebral infarction due to unspecified occlusion or stenosis of unspecified cerebral artery: Secondary | ICD-10-CM | POA: Diagnosis not present

## 2024-01-11 DIAGNOSIS — R630 Anorexia: Secondary | ICD-10-CM | POA: Diagnosis not present

## 2024-01-11 DIAGNOSIS — Z66 Do not resuscitate: Secondary | ICD-10-CM | POA: Diagnosis not present

## 2024-01-11 DIAGNOSIS — L89323 Pressure ulcer of left buttock, stage 3: Secondary | ICD-10-CM | POA: Diagnosis present

## 2024-01-11 DIAGNOSIS — R6520 Severe sepsis without septic shock: Secondary | ICD-10-CM | POA: Diagnosis not present

## 2024-01-11 DIAGNOSIS — I69354 Hemiplegia and hemiparesis following cerebral infarction affecting left non-dominant side: Secondary | ICD-10-CM

## 2024-01-11 DIAGNOSIS — F0390 Unspecified dementia without behavioral disturbance: Secondary | ICD-10-CM | POA: Diagnosis present

## 2024-01-11 DIAGNOSIS — Z794 Long term (current) use of insulin: Secondary | ICD-10-CM | POA: Diagnosis not present

## 2024-01-11 DIAGNOSIS — E440 Moderate protein-calorie malnutrition: Secondary | ICD-10-CM

## 2024-01-11 DIAGNOSIS — Y846 Urinary catheterization as the cause of abnormal reaction of the patient, or of later complication, without mention of misadventure at the time of the procedure: Secondary | ICD-10-CM | POA: Diagnosis present

## 2024-01-11 DIAGNOSIS — F0283 Dementia in other diseases classified elsewhere, unspecified severity, with mood disturbance: Secondary | ICD-10-CM | POA: Diagnosis present

## 2024-01-11 DIAGNOSIS — Z91013 Allergy to seafood: Secondary | ICD-10-CM

## 2024-01-11 DIAGNOSIS — E785 Hyperlipidemia, unspecified: Secondary | ICD-10-CM | POA: Diagnosis not present

## 2024-01-11 DIAGNOSIS — E876 Hypokalemia: Secondary | ICD-10-CM | POA: Diagnosis present

## 2024-01-11 DIAGNOSIS — G9341 Metabolic encephalopathy: Secondary | ICD-10-CM | POA: Diagnosis present

## 2024-01-11 DIAGNOSIS — R296 Repeated falls: Secondary | ICD-10-CM | POA: Diagnosis present

## 2024-01-11 DIAGNOSIS — I10 Essential (primary) hypertension: Secondary | ICD-10-CM | POA: Diagnosis present

## 2024-01-11 DIAGNOSIS — B964 Proteus (mirabilis) (morganii) as the cause of diseases classified elsewhere: Secondary | ICD-10-CM | POA: Diagnosis present

## 2024-01-11 DIAGNOSIS — N179 Acute kidney failure, unspecified: Secondary | ICD-10-CM | POA: Diagnosis present

## 2024-01-11 DIAGNOSIS — Z87891 Personal history of nicotine dependence: Secondary | ICD-10-CM

## 2024-01-11 DIAGNOSIS — I6381 Other cerebral infarction due to occlusion or stenosis of small artery: Secondary | ICD-10-CM | POA: Diagnosis present

## 2024-01-11 DIAGNOSIS — Z9189 Other specified personal risk factors, not elsewhere classified: Secondary | ICD-10-CM | POA: Diagnosis not present

## 2024-01-11 DIAGNOSIS — Z7985 Long-term (current) use of injectable non-insulin antidiabetic drugs: Secondary | ICD-10-CM

## 2024-01-11 DIAGNOSIS — D638 Anemia in other chronic diseases classified elsewhere: Secondary | ICD-10-CM | POA: Diagnosis present

## 2024-01-11 DIAGNOSIS — Z7982 Long term (current) use of aspirin: Secondary | ICD-10-CM

## 2024-01-11 DIAGNOSIS — Z91014 Allergy to mammalian meats: Secondary | ICD-10-CM

## 2024-01-11 DIAGNOSIS — Z89611 Acquired absence of right leg above knee: Secondary | ICD-10-CM

## 2024-01-11 DIAGNOSIS — K922 Gastrointestinal hemorrhage, unspecified: Secondary | ICD-10-CM

## 2024-01-11 DIAGNOSIS — K921 Melena: Secondary | ICD-10-CM | POA: Diagnosis not present

## 2024-01-11 DIAGNOSIS — Z79899 Other long term (current) drug therapy: Secondary | ICD-10-CM

## 2024-01-11 DIAGNOSIS — Z7902 Long term (current) use of antithrombotics/antiplatelets: Secondary | ICD-10-CM

## 2024-01-11 DIAGNOSIS — R197 Diarrhea, unspecified: Secondary | ICD-10-CM | POA: Diagnosis not present

## 2024-01-11 DIAGNOSIS — I739 Peripheral vascular disease, unspecified: Secondary | ICD-10-CM | POA: Diagnosis present

## 2024-01-11 DIAGNOSIS — E871 Hypo-osmolality and hyponatremia: Secondary | ICD-10-CM | POA: Diagnosis not present

## 2024-01-11 DIAGNOSIS — Z8673 Personal history of transient ischemic attack (TIA), and cerebral infarction without residual deficits: Secondary | ICD-10-CM | POA: Diagnosis not present

## 2024-01-11 DIAGNOSIS — I69320 Aphasia following cerebral infarction: Secondary | ICD-10-CM

## 2024-01-11 DIAGNOSIS — I9589 Other hypotension: Secondary | ICD-10-CM | POA: Diagnosis present

## 2024-01-11 DIAGNOSIS — K5900 Constipation, unspecified: Secondary | ICD-10-CM | POA: Diagnosis present

## 2024-01-11 DIAGNOSIS — Z515 Encounter for palliative care: Secondary | ICD-10-CM | POA: Diagnosis not present

## 2024-01-11 DIAGNOSIS — N19 Unspecified kidney failure: Secondary | ICD-10-CM | POA: Diagnosis present

## 2024-01-11 DIAGNOSIS — I6389 Other cerebral infarction: Secondary | ICD-10-CM | POA: Diagnosis not present

## 2024-01-11 DIAGNOSIS — Z91128 Patient's intentional underdosing of medication regimen for other reason: Secondary | ICD-10-CM

## 2024-01-11 DIAGNOSIS — R29717 NIHSS score 17: Secondary | ICD-10-CM | POA: Diagnosis present

## 2024-01-11 DIAGNOSIS — N400 Enlarged prostate without lower urinary tract symptoms: Secondary | ICD-10-CM | POA: Diagnosis present

## 2024-01-11 DIAGNOSIS — W19XXXA Unspecified fall, initial encounter: Secondary | ICD-10-CM | POA: Diagnosis present

## 2024-01-11 DIAGNOSIS — I69391 Dysphagia following cerebral infarction: Secondary | ICD-10-CM | POA: Diagnosis not present

## 2024-01-11 DIAGNOSIS — R4182 Altered mental status, unspecified: Secondary | ICD-10-CM | POA: Diagnosis not present

## 2024-01-11 DIAGNOSIS — Z823 Family history of stroke: Secondary | ICD-10-CM

## 2024-01-11 DIAGNOSIS — E46 Unspecified protein-calorie malnutrition: Secondary | ICD-10-CM | POA: Diagnosis not present

## 2024-01-11 DIAGNOSIS — A419 Sepsis, unspecified organism: Secondary | ICD-10-CM | POA: Diagnosis present

## 2024-01-11 DIAGNOSIS — G4730 Sleep apnea, unspecified: Secondary | ICD-10-CM | POA: Diagnosis present

## 2024-01-11 DIAGNOSIS — Z7984 Long term (current) use of oral hypoglycemic drugs: Secondary | ICD-10-CM

## 2024-01-11 DIAGNOSIS — T83510A Infection and inflammatory reaction due to cystostomy catheter, initial encounter: Secondary | ICD-10-CM | POA: Diagnosis not present

## 2024-01-11 DIAGNOSIS — K209 Esophagitis, unspecified without bleeding: Secondary | ICD-10-CM | POA: Diagnosis present

## 2024-01-11 LAB — COMPREHENSIVE METABOLIC PANEL WITH GFR
ALT: 22 U/L (ref 0–44)
AST: 19 U/L (ref 15–41)
Albumin: 3.2 g/dL — ABNORMAL LOW (ref 3.5–5.0)
Alkaline Phosphatase: 74 U/L (ref 38–126)
Anion gap: 13 (ref 5–15)
BUN: 214 mg/dL — ABNORMAL HIGH (ref 6–20)
CO2: 19 mmol/L — ABNORMAL LOW (ref 22–32)
Calcium: 9.2 mg/dL (ref 8.9–10.3)
Chloride: 130 mmol/L — ABNORMAL HIGH (ref 98–111)
Creatinine, Ser: 4 mg/dL — ABNORMAL HIGH (ref 0.61–1.24)
GFR, Estimated: 16 mL/min — ABNORMAL LOW (ref >60)
Glucose, Bld: 173 mg/dL — ABNORMAL HIGH (ref 70–99)
Potassium: 4.5 mmol/L (ref 3.5–5.1)
Sodium: 162 mmol/L (ref 135–145)
Total Bilirubin: 0.5 mg/dL (ref 0.0–1.2)
Total Protein: 6.5 g/dL (ref 6.5–8.1)

## 2024-01-11 LAB — BASIC METABOLIC PANEL WITH GFR
Anion gap: 17 — ABNORMAL HIGH (ref 5–15)
Anion gap: 14 (ref 5–15)
BUN: 204 mg/dL — ABNORMAL HIGH (ref 6–20)
BUN: 194 mg/dL — ABNORMAL HIGH (ref 6–20)
CO2: 18 mmol/L — ABNORMAL LOW (ref 22–32)
CO2: 17 mmol/L — ABNORMAL LOW (ref 22–32)
Calcium: 8.9 mg/dL (ref 8.9–10.3)
Calcium: 8.6 mg/dL — ABNORMAL LOW (ref 8.9–10.3)
Chloride: 127 mmol/L — ABNORMAL HIGH (ref 98–111)
Chloride: 130 mmol/L — ABNORMAL HIGH (ref 98–111)
Creatinine, Ser: 3.63 mg/dL — ABNORMAL HIGH (ref 0.61–1.24)
Creatinine, Ser: 3.25 mg/dL — ABNORMAL HIGH (ref 0.61–1.24)
GFR, Estimated: 18 mL/min — ABNORMAL LOW (ref >60)
GFR, Estimated: 21 mL/min — ABNORMAL LOW (ref >60)
Glucose, Bld: 146 mg/dL — ABNORMAL HIGH (ref 70–99)
Glucose, Bld: 182 mg/dL — ABNORMAL HIGH (ref 70–99)
Potassium: 4.5 mmol/L (ref 3.5–5.1)
Potassium: 4 mmol/L (ref 3.5–5.1)
Sodium: 162 mmol/L (ref 135–145)
Sodium: 161 mmol/L (ref 135–145)

## 2024-01-11 LAB — URINALYSIS, W/ REFLEX TO CULTURE (INFECTION SUSPECTED)
Bilirubin Urine: NEGATIVE
Glucose, UA: NEGATIVE mg/dL
Ketones, ur: NEGATIVE mg/dL
Nitrite: NEGATIVE
Protein, ur: 30 mg/dL — AB
Specific Gravity, Urine: 1.019 (ref 1.005–1.030)
pH: 7 (ref 5.0–8.0)

## 2024-01-11 LAB — CBC WITH DIFFERENTIAL/PLATELET
Abs Immature Granulocytes: 0.11 K/uL — ABNORMAL HIGH (ref 0.00–0.07)
Basophils Absolute: 0 K/uL (ref 0.0–0.1)
Basophils Relative: 0 %
Eosinophils Absolute: 0.1 K/uL (ref 0.0–0.5)
Eosinophils Relative: 1 %
HCT: 31.1 % — ABNORMAL LOW (ref 39.0–52.0)
Hemoglobin: 9.5 g/dL — ABNORMAL LOW (ref 13.0–17.0)
Immature Granulocytes: 1 %
Lymphocytes Relative: 14 %
Lymphs Abs: 2.3 K/uL (ref 0.7–4.0)
MCH: 30.8 pg (ref 26.0–34.0)
MCHC: 30.5 g/dL (ref 30.0–36.0)
MCV: 101 fL — ABNORMAL HIGH (ref 80.0–100.0)
Monocytes Absolute: 0.7 K/uL (ref 0.1–1.0)
Monocytes Relative: 4 %
Neutro Abs: 13.7 K/uL — ABNORMAL HIGH (ref 1.7–7.7)
Neutrophils Relative %: 80 %
Platelets: 301 K/uL (ref 150–400)
RBC: 3.08 MIL/uL — ABNORMAL LOW (ref 4.22–5.81)
RDW: 15.6 % — ABNORMAL HIGH (ref 11.5–15.5)
WBC: 16.9 K/uL — ABNORMAL HIGH (ref 4.0–10.5)
nRBC: 0 % (ref 0.0–0.2)

## 2024-01-11 LAB — RESP PANEL BY RT-PCR (RSV, FLU A&B, COVID)  RVPGX2
Influenza A by PCR: NEGATIVE
Influenza B by PCR: NEGATIVE
Resp Syncytial Virus by PCR: NEGATIVE
SARS Coronavirus 2 by RT PCR: NEGATIVE

## 2024-01-11 LAB — BLOOD GAS, VENOUS
Acid-base deficit: 5.3 mmol/L — ABNORMAL HIGH (ref 0.0–2.0)
Bicarbonate: 21.2 mmol/L (ref 20.0–28.0)
Drawn by: 1647
O2 Saturation: 36.9 %
Patient temperature: 37
pCO2, Ven: 44 mmHg (ref 44–60)
pH, Ven: 7.29 (ref 7.25–7.43)
pO2, Ven: <31 mmHg — CL (ref 32–45)

## 2024-01-11 LAB — TROPONIN I (HIGH SENSITIVITY): Troponin I (High Sensitivity): 67 ng/L — ABNORMAL HIGH (ref <18)

## 2024-01-11 LAB — VITAMIN B12: Vitamin B-12: 1115 pg/mL — ABNORMAL HIGH (ref 180–914)

## 2024-01-11 LAB — GLUCOSE, CAPILLARY
Glucose-Capillary: 122 mg/dL — ABNORMAL HIGH (ref 70–99)
Glucose-Capillary: 136 mg/dL — ABNORMAL HIGH (ref 70–99)
Glucose-Capillary: 168 mg/dL — ABNORMAL HIGH (ref 70–99)

## 2024-01-11 LAB — HIV ANTIBODY (ROUTINE TESTING W REFLEX): HIV Screen 4th Generation wRfx: NONREACTIVE

## 2024-01-11 LAB — AMMONIA: Ammonia: 17 umol/L (ref 9–35)

## 2024-01-11 LAB — HEMOGLOBIN A1C
Hgb A1c MFr Bld: 6.1 % — ABNORMAL HIGH (ref 4.8–5.6)
Mean Plasma Glucose: 128.37 mg/dL

## 2024-01-11 LAB — TSH: TSH: 0.892 u[IU]/mL (ref 0.350–4.500)

## 2024-01-11 LAB — T4, FREE: Free T4: 1.08 ng/dL (ref 0.61–1.12)

## 2024-01-11 LAB — I-STAT CG4 LACTIC ACID, ED: Lactic Acid, Venous: 1.4 mmol/L (ref 0.5–1.9)

## 2024-01-11 LAB — FOLATE: Folate: >20 ng/mL (ref >5.9)

## 2024-01-11 MED ORDER — ONDANSETRON HCL 4 MG PO TABS
4.0000 mg | ORAL_TABLET | Freq: Four times a day (QID) | ORAL | Status: DC | PRN
  Administered 2024-01-15: 4 mg via ORAL
  Filled 2024-01-11: qty 1

## 2024-01-11 MED ORDER — LINEZOLID 600 MG/300ML IV SOLN
600.0000 mg | Freq: Two times a day (BID) | INTRAVENOUS | Status: DC
  Administered 2024-01-12: 600 mg via INTRAVENOUS
  Filled 2024-01-11 (×2): qty 300

## 2024-01-11 MED ORDER — ACETAMINOPHEN 650 MG RE SUPP: 650.0000 mg | Freq: Four times a day (QID) | RECTAL | Status: DC | PRN

## 2024-01-11 MED ORDER — VANCOMYCIN HCL 1500 MG/300ML IV SOLN
1500.0000 mg | Freq: Once | INTRAVENOUS | Status: AC
  Administered 2024-01-11: 1500 mg via INTRAVENOUS
  Filled 2024-01-11: qty 300

## 2024-01-11 MED ORDER — HEPARIN SODIUM (PORCINE) 5000 UNIT/ML IJ SOLN
5000.0000 [IU] | Freq: Three times a day (TID) | INTRAMUSCULAR | Status: DC
  Administered 2024-01-12 – 2024-01-15 (×10): 5000 [IU] via SUBCUTANEOUS
  Filled 2024-01-11 (×10): qty 1

## 2024-01-11 MED ORDER — PIPERACILLIN-TAZOBACTAM IN DEX 2-0.25 GM/50ML IV SOLN
2.2500 g | Freq: Three times a day (TID) | INTRAVENOUS | Status: DC
  Administered 2024-01-12: 2.25 g via INTRAVENOUS
  Filled 2024-01-11 (×2): qty 50

## 2024-01-11 MED ORDER — VANCOMYCIN HCL IN DEXTROSE 1-5 GM/200ML-% IV SOLN: 1000.0000 mg | Freq: Once | INTRAVENOUS | Status: DC

## 2024-01-11 MED ORDER — DEXTROSE IN LACTATED RINGERS 5 % IV SOLN: INTRAVENOUS | Status: DC

## 2024-01-11 MED ORDER — INSULIN ASPART 100 UNIT/ML IJ SOLN
0.0000 [IU] | INTRAMUSCULAR | Status: DC
  Administered 2024-01-11: 1 [IU] via SUBCUTANEOUS
  Administered 2024-01-12: 5 [IU] via SUBCUTANEOUS
  Administered 2024-01-12 (×3): 2 [IU] via SUBCUTANEOUS
  Administered 2024-01-12: 3 [IU] via SUBCUTANEOUS
  Administered 2024-01-12 – 2024-01-13 (×4): 2 [IU] via SUBCUTANEOUS
  Administered 2024-01-13: 3 [IU] via SUBCUTANEOUS
  Administered 2024-01-13 – 2024-01-14 (×9): 2 [IU] via SUBCUTANEOUS
  Administered 2024-01-15: 1 [IU] via SUBCUTANEOUS
  Administered 2024-01-15 (×2): 2 [IU] via SUBCUTANEOUS
  Administered 2024-01-15 (×2): 3 [IU] via SUBCUTANEOUS
  Administered 2024-01-15: 1 [IU] via SUBCUTANEOUS
  Administered 2024-01-16 (×3): 2 [IU] via SUBCUTANEOUS
  Administered 2024-01-16: 1 [IU] via SUBCUTANEOUS
  Administered 2024-01-16 – 2024-01-17 (×5): 2 [IU] via SUBCUTANEOUS
  Administered 2024-01-21 – 2024-01-23 (×3): 1 [IU] via SUBCUTANEOUS
  Administered 2024-01-24 (×2): 2 [IU] via SUBCUTANEOUS
  Administered 2024-01-24 – 2024-01-26 (×2): 1 [IU] via SUBCUTANEOUS

## 2024-01-11 MED ORDER — METRONIDAZOLE 500 MG/100ML IV SOLN
500.0000 mg | Freq: Once | INTRAVENOUS | Status: AC
  Administered 2024-01-11: 500 mg via INTRAVENOUS
  Filled 2024-01-11: qty 100

## 2024-01-11 MED ORDER — ACETAMINOPHEN 325 MG PO TABS: 650.0000 mg | ORAL_TABLET | Freq: Four times a day (QID) | ORAL | Status: DC | PRN

## 2024-01-11 MED ORDER — LACTATED RINGERS IV BOLUS (SEPSIS)
1000.0000 mL | Freq: Once | INTRAVENOUS | Status: AC
  Administered 2024-01-11: 1000 mL via INTRAVENOUS

## 2024-01-11 MED ORDER — ONDANSETRON HCL 4 MG/2ML IJ SOLN
4.0000 mg | Freq: Four times a day (QID) | INTRAMUSCULAR | Status: DC | PRN
  Administered 2024-01-14 – 2024-01-26 (×2): 4 mg via INTRAVENOUS
  Filled 2024-01-11 (×2): qty 2

## 2024-01-11 MED ORDER — LINEZOLID 600 MG/300ML IV SOLN: 600.0000 mg | Freq: Two times a day (BID) | INTRAVENOUS | Status: DC

## 2024-01-11 MED ORDER — LACTATED RINGERS IV BOLUS
1000.0000 mL | Freq: Once | INTRAVENOUS | Status: AC
  Administered 2024-01-11: 1000 mL via INTRAVENOUS

## 2024-01-11 MED ORDER — LACTATED RINGERS IV BOLUS
500.0000 mL | Freq: Once | INTRAVENOUS | Status: AC
  Administered 2024-01-11: 500 mL via INTRAVENOUS

## 2024-01-11 MED ORDER — SODIUM CHLORIDE 0.9 % IV SOLN
2.0000 g | Freq: Once | INTRAVENOUS | Status: AC
  Administered 2024-01-11: 2 g via INTRAVENOUS
  Filled 2024-01-11: qty 12.5

## 2024-01-11 MED ORDER — LACTATED RINGERS IV SOLN: INTRAVENOUS | Status: DC

## 2024-01-11 NOTE — ED Notes (Signed)
 Patient transported to CT

## 2024-01-11 NOTE — ED Provider Notes (Addendum)
 [place] EMERGENCY DEPARTMENT AT [facility] Provider Note   CSN: [id number] Arrival date & time: 01/11/24  1348     Patient presents with: Altered Mental Status and Hypotension   Ryan Hughes is a 58 y.o. male.   Pt from facility with concern for general weakness and altered mental status in past 3-4 days. Pt limited historian, not verbally responding to questions - level 5 caveat.  No report of trauma/fall. No report of unilateral numbness/weakness. No report of fevers. Family subsequently arrived - they indicate last saw/spoke to patient ~ 3 weeks ago - was at baseline then, hx dementia, able to talk to but mainly answers questions 'yes'/'no', confused, and was wheeling self in wheelchair then.   The history is provided by the patient, medical records and the EMS personnel. The history is limited by the condition of the patient.       Prior to Admission medications   Medication Sig Start Date End Date Taking? Authorizing Provider  acetaminophen  (TYLENOL ) 325 MG tablet Take 650 mg by mouth every 4 (four) hours as needed (pain).    [provider]  aspirin  325 MG tablet Take 1 tablet (325 mg total) by mouth daily. Patient taking differently: Take 325 mg by mouth in the morning. 02/09/22   Rizwan, Saima, MD  atorvastatin  (LIPITOR ) 80 MG tablet Take 1 tablet (80 mg total) by mouth at bedtime. 12/10/21   Celestia Rosaline SQUIBB, NP  carvedilol  (COREG ) 12.5 MG tablet Take 1 tablet (12.5 mg total) by mouth 2 (two) times daily with a meal. Patient taking differently: Take 12.5 mg by mouth in the morning. 02/08/22   Rizwan, Saima, MD  clopidogrel  (PLAVIX ) 75 MG tablet Take 1 tablet (75 mg total) by mouth daily at 6 (six) AM. Patient not taking: Reported on 03/22/2022 02/26/22   Baglia, Corrina, PA-C  Dulaglutide  (TRULICITY ) 0.75 MG/0.5ML SOPN Inject 0.75 mg into the skin once a week. Patient taking differently: Inject 0.75 mg into the skin every Monday. 12/08/21   Celestia Rosaline SQUIBB, NP  dutasteride  (AVODART ) 0.5 MG capsule Take 1 capsule (0.5 mg total) by mouth daily. Patient taking differently: Take 0.5 mg by mouth in the morning. 12/20/21   Odell Celinda Balo, MD  insulin  aspart (NOVOLOG ) 100 UNIT/ML injection Inject 0-15 Units into the skin 3 (three) times daily with meals. CBG 70 - 120: 0 units  CBG 121 - 150: 2 units  CBG 151 - 200: 3 units  CBG 201 - 250: 5 units  CBG 251 - 300: 8 units  CBG 301 - 350: 11 units  CBG 351 - 400: 15 units 02/08/22   Rizwan, Saima, MD  insulin  lispro (HUMALOG ) 100 UNIT/ML injection Inject 0-15 Units into the skin See admin instructions. Inject 0-15 units twice daily per sliding scale: CBG 70-120 : 0 units CBG 121-150 : 2 units CBG 151-200 : 3 units CBG 201-250 : 5 units CBG 251-300 : 8 units CBG 301-350 : 11 units CBG 351-400 : 15 units    [provider]  losartan  (COZAAR ) 50 MG tablet Take 1 tablet (50 mg total) by mouth daily. 12/20/21   Odell Celinda Balo, MD  metFORMIN  (GLUCOPHAGE ) 1000 MG tablet Take 1 tablet (1,000 mg total) by mouth 2 (two) times daily with a meal. Patient taking differently: Take 1,000 mg by mouth 2 (two) times daily. 04/19/21 07/27/22  Celestia Rosaline SQUIBB, NP  Multiple Vitamin (MULTIVITAMIN WITH MINERALS) TABS tablet Take 1 tablet by mouth daily. Patient not  taking: Reported on 08/03/2022 03/29/21   Cheryle Page, MD  Nutritional Supplements (RESOURCE 2.0) LIQD Take 120 mLs by mouth 2 (two) times daily.    [provider]  pantoprazole  (PROTONIX ) 40 MG tablet Take 1 tablet (40 mg total) by mouth 2 (two) times daily. Patient not taking: Reported on 08/03/2022 12/20/21   Odell Celinda Balo, MD  povidone-iodine  (BETADINE) 10 % ointment Apply 1 Application topically daily. To necrotic area of right foot Patient not taking: Reported on 08/03/2022    [provider]  senna-docusate (SENOKOT-S) 8.6-50 MG tablet Take 1 tablet by mouth at bedtime as needed for mild  constipation. Patient taking differently: Take 1 tablet by mouth at bedtime as needed (constipation). 02/08/22   Rizwan, Saima, MD  tamsulosin  (FLOMAX ) 0.4 MG CAPS capsule Take 2 capsules (0.8 mg total) by mouth daily. Patient taking differently: Take 0.8 mg by mouth in the morning. 12/20/21   Odell Celinda Balo, MD  thiamine  (VITAMIN B1) 100 MG tablet Take 1 tablet (100 mg total) by mouth daily. Patient taking differently: Take 100 mg by mouth in the morning. 02/08/22   Rizwan, Saima, MD    Allergies: Pork-derived products and Shellfish-derived products    Review of Systems  Unable to perform ROS: Patient unresponsive  Constitutional:  Negative for fever.    Updated Vital Signs BP (!) 97/56   Pulse 89   Temp 99.5 F (37.5 C) (Rectal)   Resp (!) 29   Ht 1.676 m (5' 6)   Wt 89.8 kg   SpO2 100%   BMI 31.96 kg/m   Physical Exam Vitals and nursing note reviewed.  Constitutional:      Appearance: Normal appearance. He is well-developed.  HENT:     Head: Atraumatic.     Nose: Nose normal.     Mouth/Throat:     Mouth: Mucous membranes are moist.     Pharynx: Oropharynx is clear.  Eyes:     General: No scleral icterus.    Conjunctiva/sclera: Conjunctivae normal.     Pupils: Pupils are equal, round, and reactive to light.  Neck:     Trachea: No tracheal deviation.     Comments: Trachea midline, thyroid not grossly enlarged or tender. No neck stiffness or rigidity. Cardiovascular:     Rate and Rhythm: Normal rate and regular rhythm.     Pulses: Normal pulses.     Heart sounds: Normal heart sounds. No murmur heard.    No friction rub. No gallop.  Pulmonary:     Effort: Pulmonary effort is normal. No accessory muscle usage or respiratory distress.     Breath sounds: Normal breath sounds.  Abdominal:     General: Bowel sounds are normal. There is no distension.     Palpations: Abdomen is soft.     Tenderness: There is no abdominal tenderness. There is no guarding.      Comments: Suprapubic cath site without obvious infection. Urinary in bag is brownish, cloudy.   Genitourinary:    Comments: No cva tenderness. Normal external gu exam. No abscess/infection. No sacral/back skin infection or ulcers.  Musculoskeletal:        General: No swelling.     Cervical back: Normal range of motion and neck supple. No rigidity.     Comments: CTLS spine, non tender, aligned, no step off. No focal extremity pain, swelling, tenderness. Right AKA.   Lymphadenopathy:     Cervical: No cervical adenopathy.  Skin:    General: Skin is warm and  dry.     Findings: No rash.  Neurological:     Mental Status: He is alert.     Comments: Awake and alert appearing. Moves bil extremities purposefully. Does not respond verbally to questions.      (all labs ordered are listed, but only abnormal results are displayed) Results for orders placed or performed during the hospital encounter of 01/11/24  Comprehensive metabolic panel   Collection Time: 01/11/24  2:24 PM  Result Value Ref Range   Sodium 162 (HH) 135 - 145 mmol/L   Potassium 4.5 3.5 - 5.1 mmol/L   Chloride 130 (H) 98 - 111 mmol/L   CO2 19 (L) 22 - 32 mmol/L   Glucose, Bld 173 (H) 70 - 99 mg/dL   BUN 785 (H) 6 - 20 mg/dL   Creatinine, Ser 5.99 (H) 0.61 - 1.24 mg/dL   Calcium  9.2 8.9 - 10.3 mg/dL   Total Protein 6.5 6.5 - 8.1 g/dL   Albumin 3.2 (L) 3.5 - 5.0 g/dL   AST 19 15 - 41 U/L   ALT 22 0 - 44 U/L   Alkaline Phosphatase 74 38 - 126 U/L   Total Bilirubin 0.5 0.0 - 1.2 mg/dL   GFR, Estimated 16 (L) >60 mL/min   Anion gap 13 5 - 15  CBC with Differential   Collection Time: 01/11/24  2:24 PM  Result Value Ref Range   WBC 16.9 (H) 4.0 - 10.5 K/uL   RBC 3.08 (L) 4.22 - 5.81 MIL/uL   Hemoglobin 9.5 (L) 13.0 - 17.0 g/dL   HCT 68.8 (L) 60.9 - 47.9 %   MCV 101.0 (H) 80.0 - 100.0 fL   MCH 30.8 26.0 - 34.0 pg   MCHC 30.5 30.0 - 36.0 g/dL   RDW 84.3 (H) 88.4 - 84.4 %   Platelets 301 150 - 400 K/uL   nRBC 0.0 0.0 -  0.2 %   Neutrophils Relative % 80 %   Neutro Abs 13.7 (H) 1.7 - 7.7 K/uL   Lymphocytes Relative 14 %   Lymphs Abs 2.3 0.7 - 4.0 K/uL   Monocytes Relative 4 %   Monocytes Absolute 0.7 0.1 - 1.0 K/uL   Eosinophils Relative 1 %   Eosinophils Absolute 0.1 0.0 - 0.5 K/uL   Basophils Relative 0 %   Basophils Absolute 0.0 0.0 - 0.1 K/uL   Immature Granulocytes 1 %   Abs Immature Granulocytes 0.11 (H) 0.00 - 0.07 K/uL  Troponin I (High Sensitivity)   Collection Time: 01/11/24  2:24 PM  Result Value Ref Range   Troponin I (High Sensitivity) 67 (H) <18 ng/L  Blood Culture (routine x 2)   Collection Time: 01/11/24  2:51 PM   Specimen: BLOOD  Result Value Ref Range   Specimen Description BLOOD RIGHT ANTECUBITAL    Special Requests      BOTTLES DRAWN AEROBIC AND ANAEROBIC Blood Culture adequate volume Performed at [facility], [address], [place], [place] [address]    Culture PENDING    Report Status PENDING   I-Stat Lactic Acid, ED   Collection Time: 01/11/24  2:53 PM  Result Value Ref Range   Lactic Acid, Venous 1.4 0.5 - 1.9 mmol/L      EKG: EKG Interpretation Date/Time:  Thursday January 11 2024 14:03:51 EDT Ventricular Rate:  92 PR Interval:  122 QRS Duration:  122 QT Interval:  364 QTC Calculation: 451 R Axis:   71  Text Interpretation: Sinus rhythm Nonspecific T wave abnormality  Confirmed by Bernard Drivers ([id number]) on 01/11/2024 3:07:10 PM  Radiology: [facility] Chest Port 1 View Result Date: 01/11/2024 CLINICAL DATA:  Sepsis EXAM: PORTABLE CHEST 1 VIEW COMPARISON:  March 25, 2024 FINDINGS: Low lung volumes. No focal consolidation. No pleural effusions. No pneumothorax. Unchanged cardiomediastinal silhouette.  No acute osseous findings. IMPRESSION: Hypoinflation.  No acute findings. Electronically Signed   By: Michaeline Blanch M.D.   On: 01/11/2024 15:54     Procedures   Medications Ordered in the ED  lactated ringers  infusion ( Intravenous New Bag/Given 01/11/24 1432)   metroNIDAZOLE  (FLAGYL ) IVPB 500 mg (500 mg Intravenous New Bag/Given 01/11/24 1537)  vancomycin  (VANCOREADY) IVPB 1500 mg/300 mL (has no administration in time range)  lactated ringers  bolus 1,000 mL (0 mLs Intravenous Stopped 01/11/24 1602)  ceFEPIme  (MAXIPIME ) 2 g in sodium chloride  0.9 % 100 mL IVPB (0 g Intravenous Stopped 01/11/24 1532)  lactated ringers  bolus 1,000 mL (1,000 mLs Intravenous New Bag/Given 01/11/24 1537)    Clinical Course as of 01/11/24 1611  Thu Jan 11, 2024  1607 Handoff [place] Hospitalist coming to see, +/- ICU  [SG]    Clinical Course User Index [SG] Elnor Jayson LABOR, DO                                 Medical Decision Making Problems Addressed: AKI (acute kidney injury) [facility]): acute illness or injury with systemic symptoms that poses a threat to life or bodily functions Hypernatremia: acute illness or injury with systemic symptoms that poses a threat to life or bodily functions Hypotension due to hypovolemia: acute illness or injury with systemic symptoms that poses a threat to life or bodily functions  Amount and/or Complexity of Data Reviewed Independent Historian: EMS    Details: Ems/family.  External Data Reviewed: notes. Labs: ordered. Decision-making details documented in ED Course. Radiology: ordered and independent interpretation performed. Decision-making details documented in ED Course. ECG/medicine tests: ordered and independent interpretation performed. Decision-making details documented in ED Course. Discussion of management or test interpretation with external provider(s): medicine  Risk Prescription drug management. Decision regarding hospitalization.   Iv ns. Continuous pulse ox and cardiac monitoring. Labs ordered/sent. Imaging ordered.   Differential diagnosis includes sepsis, uti, etc. Dispo decision including potential need for admission considered - will get labs and imaging and reassess.   Reviewed nursing notes and prior charts for  additional history. External reports reviewed. Additional history from: EMS.  Cultures sent. LR bolus. Iv abx.   Cardiac monitor: sinus rhythm, rate 80.  Labs reviewed/interpreted by me - lactate normal. Wbc 16, hct 31. Chem w na v high, and aki. LR boluses. Bp improving.  Ua pending.   Xrays reviewed/interpreted by me - pnd.   Additional ivf. Recheck bp improved from prior.   Recheck chest cta. Abd soft nt.   Medicine consulted for admission. Discussed pt with hospitalist -- they will see in ED and either admit or discuss with critical care for admission. Pt signed out to Dr Elnor in ED.   CRITICAL CARE RE: hypotension/hypovolemia, aki, hypernatremia.  R/o uti Performed by: Alquan Morrish E Renu Asby Total critical care time: 90 minutes Critical care time was exclusive of separately billable procedures and treating other patients. Critical care was necessary to treat or prevent imminent or life-threatening deterioration. Critical care was time spent personally by me on the following activities: development of treatment plan with patient and/or surrogate as well as nursing, discussions with  consultants, evaluation of patient's response to treatment, examination of patient, obtaining history from patient or surrogate, ordering and performing treatments and interventions, ordering and review of laboratory studies, ordering and review of radiographic studies, pulse oximetry and re-evaluation of patient's condition.       Final diagnoses:  Hypernatremia  AKI (acute kidney injury) (HCC)  Hypotension due to hypovolemia    ED Discharge Orders     None          Bernard Drivers, MD 01/11/24 [phone]

## 2024-01-11 NOTE — ED Notes (Signed)
 Lab made aware of add-on Troponin.

## 2024-01-11 NOTE — ED Notes (Signed)
 Drainage bag changed.  Will obtain a urine sample when new urine is made. EDP aware.

## 2024-01-11 NOTE — Progress Notes (Signed)
 Pt admitted to rm 11 from ED. Initiated tele. Obtained vs. Call bell within reach. Bed alarm activated.   Amado GORMAN Arabia, RN

## 2024-01-11 NOTE — ED Triage Notes (Signed)
 Per EMS, Pt, from [facility], presents d/t increasing AMS and sweating x4 days.  Pt noted to be hypotensive.  Pt has a suprapubic catheter.  Family reports Pt normally gets around well in a wheelchair and is talkative.

## 2024-01-11 NOTE — Sepsis Progress Note (Signed)
 Elink monitoring for the code sepsis protocol.

## 2024-01-11 NOTE — Hospital Course (Addendum)
 CC: confusion. Per EMS, Pt, from [facility], presents d/t increasing AMS and sweating x4 days.  Pt noted to be hypotensive.  Pt has a suprapubic catheter. Family reports Pt normally gets around well in a wheelchair and is talkative.   HPI: 58 yo AAM with PMH of dementia, depression, CVA, HLD, type II DM, PVD with right AKA, wheelchair-bound, BPH with suprapubic catheter present to the hospital with complaints of altered mental status.  History was obtained from family was at bedside.  Patient is unable to provide history secondary to confusion and unable to reach staff at the [facility].  Per EMS reportedly patient had confusion progressively worsening for last 4 days.  Per niece 2 days ago patient stopped taking his medications.  As the patient progressively decline [facility] staff sent the patient to hospital for further evaluation. Patient at his baseline is in wheelchair, normally carries out a conversation with clear speech but confused thoughts due to his dementia.  Patient pushes his wheelchair himself per niece. Patient has history of slipping out of his bed when family is visiting him.  And reportedly had a fall 2 weeks ago. No recent change in medications reported by the family. Per RN his suprapubic catheter was with significant sediments.  Significant Events: Admitted 01/11/2024 for acute hypernatremia, AKI 01-11-2024 until 9-10-20254 Acute hypernatremia, uremia  and ARF resolved with IVF. Na of 143, BUN of 16, Scr 1.08. pt UTI due to indwelling suprapubic tube treated with IV unasyn . Stopped on 01-17-2024 01-12-2024 to 01-14-2024  MRI brain shows acute lacunar strokes. Neurology consulted. Cva work up proceeds. Echo normal. Carotid dopplers minimal stenosis. Final recs are:aspirin  81 mg daily and Brilinta  (ticagrelor ) 90 mg bid for 4 weeks and then aspirin  alone  01-17-2024 GI consulted due to melena and anemia. Pt transfused 1 unit PRBC. GI felt pt was as poor  candidate for endoscopy procedure. 01-18-2024 palliative care consulted for GOC Week of 01-17-2024 through 01-23-2024 GI bleeding stops after Brilinta  held.  Pt's niece [name] wanted Brilinta  to be restarted(01-20-2024) Week of 01-24-2024 through 01-30-2024. Pt refusing to take meds as well as po food/liquids. He is combatative with nursing staff who are providing basic care including changing bed linens and cleaning around his suprapubic tube Week of 01-31-2024 through 02-06-2024. Pt intermittently takes his meds. Palliative care discussed with family. Pt made DNR/DNI. SW able to locate SNF for LTC  Admission Labs: Na 162, K 4.5, CO2 of 19, BUN 214, Scr 4.0 T prot 6.5, alb 3.2, AST 19, ALT 22, alk phos 74, t. Bili 0.5 WBC 16.9, HgB 9.5, plt 301 Covid/rsv/flu negative VBG pH 7.29, PCO2 of 44 HIV non-reactive TSH 0.892, FT4 1.08 NH3 of 17  Admission Imaging Studies: CXR Hypoinflation. No acute findings.  CT head No acute intracranial abnormality. 2. Stable atrophy and chronic small vessel ischemic changes. Remote lacunar infarcts in the bilateral basal ganglia Renal U/S Increased bilateral renal cortical echogenicity, compatible with medical renal disease. 2. Trace right perinephric fluid.  No hydronephrosis   Significant Labs: 01-11-2024 Vit b12 of 1115, folate > 20, vit B1 of 136 01-13-2024 A1c of 6.1%  Significant Imaging Studies: 01-12-2024 MRI Brain There are multiple old lacunar infarcts including bilateral cerebellar, bilateral pontine, right midbrain, bilateral thalamic. There is an old infarct in the basal ganglia on the right with encephalomalacia. With the exception of the basal ganglia infarct, all of these have developed since the prior MRI study from 2022. There are several acute lacunar  infarcts involving the left frontal lobe and left posterior temporal/parietal lobe, and a single acute lacunar infarct in the right frontal lobe 01-12-2024 MRA head No large vessel occlusion or  proximal hemodynamically significant stenosis 01-15-2024 abd xr Mildly dilated loops of jejunum in the left mid abdomen. This could be due to a focal ileus or mild partial small bowel obstructio 01-15-2024 CT abd/pelvis No acute findings or explanation for the patient's symptoms. No evidence of bowel obstruction. 2. New small bilateral pleural effusions with adjacent dependent airspace opacities, likely atelectasis. 3. Distal esophageal wall thickening, similar to previous study, suggesting esophagitis or chronic reflux. Correlate clinically. 4. Suprapubic bladder catheter in place with possible mild bladder wall thickening. 5. Aortic Atherosclerosis (ICD10-I70.0). Interval postsurgical changes of the right groin consistent with femoral bypass procedure.  Antibiotic Therapy: Anti-infectives (From admission, onward)    Start     Dose/Rate Route Frequency Ordered Stop   01/13/24 1515  Ampicillin -Sulbactam (UNASYN ) 3 g in sodium chloride  0.9 % 100 mL IVPB        3 g 200 mL/hr over 30 Minutes Intravenous Every 6 hours 01/13/24 1430 01/17/24 2209   01/12/24 1200  piperacillin -tazobactam (ZOSYN ) IVPB 3.375 g  Status:  Discontinued        3.375 g 12.5 mL/hr over 240 Minutes Intravenous Every 8 hours 01/12/24 0737 01/13/24 1102   01/12/24 1000  linezolid  (ZYVOX ) IVPB 600 mg  Status:  Discontinued        600 mg 300 mL/hr over 60 Minutes Intravenous Every 12 hours 01/11/24 1659 01/12/24 1448   01/12/24 0600  piperacillin -tazobactam (ZOSYN ) IVPB 2.25 g  Status:  Discontinued        2.25 g 100 mL/hr over 30 Minutes Intravenous Every 8 hours 01/11/24 1746 01/12/24 0737   01/11/24 2200  linezolid  (ZYVOX ) IVPB 600 mg  Status:  Discontinued        600 mg 300 mL/hr over 60 Minutes Intravenous Every 12 hours 01/11/24 1659 01/11/24 1659   01/11/24 1415  ceFEPIme  (MAXIPIME ) 2 g in sodium chloride  0.9 % 100 mL IVPB        2 g 200 mL/hr over 30 Minutes Intravenous  Once 01/11/24 1402 01/11/24 1532   01/11/24  1415  metroNIDAZOLE  (FLAGYL ) IVPB 500 mg        500 mg 100 mL/hr over 60 Minutes Intravenous  Once 01/11/24 1402 01/11/24 1647   01/11/24 1415  vancomycin  (VANCOCIN ) IVPB 1000 mg/200 mL premix  Status:  Discontinued        1,000 mg 200 mL/hr over 60 Minutes Intravenous  Once 01/11/24 1402 01/11/24 1406   01/11/24 1415  vancomycin  (VANCOREADY) IVPB 1500 mg/300 mL        1,500 mg 150 mL/hr over 120 Minutes Intravenous  Once 01/11/24 1407 01/11/24 1854       Procedures: Blood Transfusion Record     Product Unit Status Volume Start End          Transfuse RBC    W2399  25  [id number]  [id number] Completed 01/17/24 1016 679 mL 01/17/24 0616 01/17/24 1016    W2399  25  [id number]  [id number] Completed 01/16/24 0324 315 mL 01/15/24 2324 01/16/24 0213   Consultants: Palliative care Neurology GI

## 2024-01-11 NOTE — Progress Notes (Signed)
 Pharmacy Antibiotic Note  Dary Buchmann is a 58 y.o. male for which pharmacy has been consulted for zosyn  dosing for sepsis.  SCr 4 - AKI WBC 16.9; LA 1.4; T 99; HR 88; RR 19 COVID neg / flu neg  Cefepime  and vancomycin  given x 1 in ED  Plan: Zosyn  2.25 g q8h Linezolid  per MD Monitor WBC, fever, renal function, cultures De-escalate when able F/u Nephrology plan  Height: 5' 6 (167.6 cm) Weight: 89.8 kg (198 lb) IBW/kg (Calculated) : 63.8  Temp (24hrs), Avg:99.5 F (37.5 C), Min:99.5 F (37.5 C), Max:99.5 F (37.5 C)  Recent Labs  Lab 01/11/24 1424 01/11/24 1453  WBC 16.9*  --   CREATININE 4.00*  --   LATICACIDVEN  --  1.4    Estimated Creatinine Clearance: 20.9 mL/min (A) (by C-G formula based on SCr of 4 mg/dL (H)).    Allergies  Allergen Reactions   Pork-Derived Products Other (See Comments)    Gout flares   Shellfish-Derived Products Other (See Comments)    Gout flares   Microbiology results: Pending  Thank you for allowing pharmacy to be a part of this patient's care.  Dorn Buttner, PharmD, BCPS 01/11/2024 5:25 PM ED Clinical Pharmacist -  [phone]

## 2024-01-11 NOTE — Sepsis Progress Note (Signed)
 Notified provider and bedside nurse of need to order and administer fluid bolus. Pt needs 2694 cc fluid

## 2024-01-11 NOTE — ED Notes (Signed)
 Pt and all belongings given to transport.

## 2024-01-11 NOTE — ED Notes (Signed)
 4E RN made aware of critical sodium 162.

## 2024-01-11 NOTE — ED Notes (Signed)
 Transport called to take Pt upstairs.

## 2024-01-11 NOTE — H&P (Addendum)
 History and Physical  Patient: Ryan Hughes DOB: 1965/08/14 DOA: 01/11/2024 DOS: the patient was seen and examined on 01/11/2024 Patient coming from: SNF  Chief Complaint: Confusion. Per EMS, Pt, from [facility], presents d/t increasing AMS and sweating x4 days.  Pt noted to be hypotensive.  Pt has a suprapubic catheter. Family reports Pt normally gets around well in a wheelchair and is talkative.     HPI: Patient with PMH of dementia, depression, CVA, HLD, type II DM, PVD with right AKA, wheelchair-bound, BPH with suprapubic catheter present to the hospital with complaints of altered mental status. History was obtained from family was at bedside.  Patient is unable to provide history secondary to confusion and unable to reach staff at the [facility]. Per EMS reportedly patient had confusion progressively worsening for last 4 days.  Per niece 2 days ago patient stopped taking his medications.  As the patient progressively decline [facility] staff sent the patient to hospital for further evaluation. Patient at his baseline is in wheelchair, normally carries out a conversation with clear speech but confused thoughts due to his dementia.  Patient pushes his wheelchair himself per niece. Patient has history of slipping out of his bed when family is visiting him.  And reportedly had a fall 2 weeks ago. No recent change in medications reported by the family. Per RN his suprapubic catheter was with significant sediments.  Assessment and Plan: Acute renal failure Acute hypernatremia Uremia Normal anion gap metabolic acidosis Presents with confusion. Workup shows serum creatinine of 4, sodium of 162, bicarb 19. Received aggressive hydration with 2 L LR bolus. I ordered another 100 mL bolus. Will switch to D5 LR. Recheck sodium level. Likely from poor p.o. intake and dehydration based on the history provided from the family. Check US  renal. Suprapubic  catheter appears to be functioning okay.  Sepsis secondary to UTI due to chronic indwelling catheter Chronic suprapubic catheter  BPH Patient has chronic suprapubic catheter. Unknown last change time. Suprapubic tubing was changed in the ER. Will check UA. Presented with hypotension, leukocytosis, tachypnea with potential source of infection in the urine. Chest x-ray is clear. No other evidence of infection. Currently receiving IV vancomycin  and cefepime  in the ED. Will switch to IV Zosyn  and IV Zyvox  in the setting of AKI and encephalopathy. Monitor urine culture. Monitor blood cultures. Continue with aggressive IV hydration.  Dementia without behavioral disturbance (HCC) Depression Acute metabolic encephalopathy History of CVA (cerebrovascular accident) Unwitnessed fall Primary reason for presentation is confusion. Suspect this is in the setting of metabolic abnormality with AKI as well as hyponatremia. Delirium in the setting of dementia in the setting of UTI cannot be ruled out as well. Had unwitnessed fall as well as prior history of CVA as well. Differential for his encephalopathy is wide. For now we will perform a CT scan of the head.  Check VBG.  Check ammonia, TSH, B1, B12, folic acid . Patient unable to swallow safely right now remains NPO. Monitor CBG every 4 hours. Holding home medications for depression and dementia. Holding aspirin  and Plavix  until CT scan is performed. SLP PT OT consulted. May require MRI brain to rule out acute stroke.  HTN Presents with hypotension. Currently holding home medications. Continue with aggressive IV hydration.  Type 2 diabetes mellitus with vascular disease (HCC) Check hemoglobin A1c. On sliding scale insulin  and metformin  and Trulicity . For now continuing only sliding scale.  Anemia of chronic disease Hemoglobin stable. For now monitor. Anticipating  dilutional drop in his hemoglobin after aggressive IV hydration.  PAD  (peripheral artery disease) (HCC) On aspirin  Plavix . Also has history of AKA on the right. Monitor for now.  Sleep apnea Patient has apneic spell while in the ED on my evaluation. No prior history of sleep apnea. CT scan of the head ordered. Check VBG as well. BiPAP as needed.  Elevated troponin Likely in the setting of poor clearance due to AKI rather than demand ischemia. Will monitor serial troponin. No evidence of EKG evidence of ischemia.  Advance Care Planning:   Code Status: Full Code  Discussed in detail with regards to goals of care with niece at bedside. Niece has paperwork for HCPOA based on chart review as well. Patient's prognosis is poor should he have an event based on his presentation. Given severe electrolyte abnormality as well as ongoing encephalopathy patient remains at a very high risk for a cardiac arrest like event. Recommended that outcome of event for resuscitation in the best case scenario most likely would lead to further encephalopathy as well as renal failure. Given his baseline dementia unsure if family wants to pursue CPR and intubation. Niece would like to discuss with her aunt-patient's sister before making any decisions. Monitor.  Consults: Palliative care   Prior to Admission medications   Medication Sig Start Date End Date Taking? Authorizing Provider  acetaminophen  (TYLENOL ) 325 MG tablet Take 650 mg by mouth 2 (two) times daily as needed (pain).   Yes [provider]  acetaminophen  (TYLENOL ) 650 MG CR tablet Take 650 mg by mouth daily.   Yes [provider]  amLODipine (NORVASC) 5 MG tablet Take 5 mg by mouth daily.   Yes [provider]  aspirin  325 MG tablet Take 1 tablet (325 mg total) by mouth daily. Patient taking differently: Take 325 mg by mouth in the morning. 02/09/22  Yes Rizwan, Saima, MD  atorvastatin  (LIPITOR ) 80 MG tablet Take 1 tablet (80 mg total) by mouth at bedtime. 12/10/21  Yes Celestia Rosaline SQUIBB, NP   b complex vitamins capsule Take 1 capsule by mouth daily.   Yes [provider]  carvedilol  (COREG ) 12.5 MG tablet Take 1 tablet (12.5 mg total) by mouth 2 (two) times daily with a meal. 02/08/22  Yes Rizwan, True, MD  clopidogrel  (PLAVIX ) 75 MG tablet Take 1 tablet (75 mg total) by mouth daily at 6 (six) AM. 02/26/22  Yes Baglia, Corrina, PA-C  Dulaglutide  (TRULICITY ) 0.75 MG/0.5ML SOPN Inject 0.75 mg into the skin once a week. Patient taking differently: Inject 0.75 mg into the skin once a week. Wednesdays 12/08/21  Yes Celestia Rosaline SQUIBB, NP  dutasteride  (AVODART ) 0.5 MG capsule Take 1 capsule (0.5 mg total) by mouth daily. Patient taking differently: Take 0.5 mg by mouth in the morning. 12/20/21  Yes Odell Celinda Balo, MD  ferrous gluconate (FERGON) 324 MG tablet Take 324 mg by mouth daily with breakfast.   Yes [provider]  furosemide (LASIX) 20 MG tablet Take 20 mg by mouth every other day.   Yes [provider]  insulin  aspart (NOVOLOG ) 100 UNIT/ML injection Inject 0-15 Units into the skin 3 (three) times daily with meals. CBG 70 - 120: 0 units  CBG 121 - 150: 2 units  CBG 151 - 200: 3 units  CBG 201 - 250: 5 units  CBG 251 - 300: 8 units  CBG 301 - 350: 11 units  CBG 351 - 400: 15 units Patient taking differently: Inject 0-8 Units  into the skin 3 (three) times daily with meals. BG 200-250 = 2 units BG 251-300 = 4 units BG 301-350 = 6 units BG 351-400 = 8 units 02/08/22  Yes Rizwan, Saima, MD  losartan  (COZAAR ) 50 MG tablet Take 1 tablet (50 mg total) by mouth daily. Patient taking differently: Take by mouth daily. 12/20/21  Yes Odell Celinda Balo, MD  metFORMIN  (GLUCOPHAGE ) 1000 MG tablet Take 1 tablet (1,000 mg total) by mouth 2 (two) times daily with a meal. Patient taking differently: Take 1,000 mg by mouth daily with breakfast. 04/19/21 01/10/25 Yes Celestia Rosaline SQUIBB, NP  PARoxetine (PAXIL) 10 MG tablet Take 10 mg by mouth daily.   Yes [provider]  senna-docusate (SENOKOT-S) 8.6-50 MG tablet Take 1 tablet by mouth at bedtime as needed for mild constipation. Patient taking differently: Take 1 tablet by mouth at bedtime as needed (constipation). 02/08/22  Yes Rizwan, Saima, MD  sodium chloride  (OCEAN) 0.65 % SOLN nasal spray Place 1 spray into both nostrils daily. For nose bleed risk   Yes [provider]  tamsulosin  (FLOMAX ) 0.4 MG CAPS capsule Take 2 capsules (0.8 mg total) by mouth daily. Patient taking differently: Take 0.8 mg by mouth in the morning. 12/20/21  Yes Odell Celinda Balo, MD    Past Medical History:  Diagnosis Date   Acute arterial ischemic stroke, vertebrobasilar, thalamic, left (HCC) 04/25/2020   Acute CVA (cerebrovascular accident) (HCC) 03/27/2021   BPH (benign prostatic hyperplasia)    Diabetes mellitus without complication (HCC)    HLD (hyperlipidemia)    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   Hypertension    Nicotine  dependence, cigarettes, uncomplicated 04/25/2020   Sleep apnea 01/11/2024   Stroke [facility])    Past Surgical History:  Procedure Laterality Date   ABDOMINAL AORTOGRAM W/LOWER EXTREMITY N/A 02/02/2022   Procedure: ABDOMINAL AORTOGRAM W/LOWER EXTREMITY;  Surgeon: Lanis Fonda BRAVO, MD;  Location: [facility] INVASIVE CV LAB;  Service: Cardiovascular;  Laterality: N/A;   AMPUTATION Right 02/17/2022   Procedure: AMPUTATION DIGIT GREAT AND SECOND TOES;  Surgeon: Sheree Penne Bruckner, MD;  Location[facility] OR;  Service: Vascular;  Laterality: Right;   AMPUTATION Right 03/22/2022   Procedure: AMPUTATION ABOVE KNEE;  Surgeon: Sheree Penne Bruckner, MD;  Location[facility] OR;  Service: Vascular;  Laterality: Right;   BYPASS GRAFT FEMORAL-PERONEAL Right 02/17/2022   Procedure: Right BYPASS GRAFT GURNEY;  Surgeon: Sheree Penne Bruckner, MD;  Location: [facility] OR;  Service: Vascular;  Laterality: Right;   ENDARTERECTOMY FEMORAL Right 02/17/2022   Procedure:  ENDARTERECTOMY FEMORAL RIGHT;  Surgeon: Sheree Penne Bruckner, MD;  Location: [facility] OR;  Service: Vascular;  Laterality: Right;   INSERTION OF ILIAC STENT  02/17/2022   Procedure: INSERTION OF PERONEAL STENT;  Surgeon: Sheree Penne Bruckner, MD;  Location: [facility] OR;  Service: Vascular;;   IR CATHETER TUBE CHANGE  09/19/2022   LOWER EXTREMITY ANGIOGRAM  02/17/2022   Procedure: LOWER EXTREMITY ANGIOGRAM;  Surgeon: Sheree Penne Bruckner, MD;  Location: [facility] OR;  Service: Vascular;;   Social History:  reports that he quit smoking about 2 years ago. His smoking use included cigarettes. He has never used smokeless tobacco. He reports current alcohol use. He reports current drug use. Drug: Marijuana. Allergies  Allergen Reactions   Pork-Derived Products Other (See Comments)    Gout flares   Shellfish-Derived Products Other (See Comments)    Gout flares   Family History  Problem Relation Age of Onset   Stroke Mother    Physical Exam:  Vitals:   01/11/24 1615 01/11/24 1630 01/11/24 1645 01/11/24 1730  BP: 112/60 107/63 116/64 (!) 108/59  Pulse: 88 88  88  Resp: (!) 22 (!) 27 19 (!) 23  Temp:      TempSrc:      SpO2: 100% 100%  99%  Weight:      Height:       Clear to auscultation. S1-S2 present.  Tachycardic. Bowel sound present.  Nontender. No edema of the lower extremities.  Withdraws to painful stimuli on the left lower extremity. Lethargic, able to open up his eyes, unable to follow commands, nonverbal Spontaneously moving upper extremities.  Data Reviewed: I have reviewed ED notes, Vitals, Lab results and outpatient records. Since last encounter, pertinent lab results CBC and BMP   . I have ordered test including see above  . I have discussed pt's care plan and test results with ED provider  . I have ordered imaging CT head  .   Family Communication: Discussed with niece at bedside The patient is critically ill with multiple organ systems failure and requires high  complexity decision making for assessment and support, frequent evaluation and titration of therapies. Critical Care Time devoted to patient care services described in this note is 35 minutes   Author: Yetta Blanch, MD 01/11/2024 5:57 PM For on call review [email or web address].

## 2024-01-12 ENCOUNTER — Inpatient Hospital Stay (HOSPITAL_COMMUNITY)

## 2024-01-12 DIAGNOSIS — G9341 Metabolic encephalopathy: Secondary | ICD-10-CM | POA: Diagnosis not present

## 2024-01-12 DIAGNOSIS — R29717 NIHSS score 17: Secondary | ICD-10-CM | POA: Diagnosis not present

## 2024-01-12 DIAGNOSIS — I6389 Other cerebral infarction: Secondary | ICD-10-CM | POA: Diagnosis not present

## 2024-01-12 DIAGNOSIS — R4182 Altered mental status, unspecified: Secondary | ICD-10-CM | POA: Diagnosis not present

## 2024-01-12 DIAGNOSIS — Z7189 Other specified counseling: Secondary | ICD-10-CM | POA: Diagnosis not present

## 2024-01-12 DIAGNOSIS — K089 Disorder of teeth and supporting structures, unspecified: Secondary | ICD-10-CM

## 2024-01-12 DIAGNOSIS — I6381 Other cerebral infarction due to occlusion or stenosis of small artery: Secondary | ICD-10-CM | POA: Diagnosis not present

## 2024-01-12 DIAGNOSIS — N179 Acute kidney failure, unspecified: Secondary | ICD-10-CM | POA: Diagnosis not present

## 2024-01-12 DIAGNOSIS — L8990 Pressure ulcer of unspecified site, unspecified stage: Secondary | ICD-10-CM | POA: Diagnosis present

## 2024-01-12 DIAGNOSIS — I6350 Cerebral infarction due to unspecified occlusion or stenosis of unspecified cerebral artery: Secondary | ICD-10-CM | POA: Diagnosis not present

## 2024-01-12 DIAGNOSIS — Z8673 Personal history of transient ischemic attack (TIA), and cerebral infarction without residual deficits: Secondary | ICD-10-CM

## 2024-01-12 LAB — BASIC METABOLIC PANEL WITH GFR
Anion gap: 19 — ABNORMAL HIGH (ref 5–15)
Anion gap: 9 (ref 5–15)
BUN: 187 mg/dL — ABNORMAL HIGH (ref 6–20)
BUN: 158 mg/dL — ABNORMAL HIGH (ref 6–20)
BUN: 131 mg/dL — ABNORMAL HIGH (ref 6–20)
BUN: 118 mg/dL — ABNORMAL HIGH (ref 6–20)
CO2: 14 mmol/L — ABNORMAL LOW (ref 22–32)
CO2: 18 mmol/L — ABNORMAL LOW (ref 22–32)
CO2: 17 mmol/L — ABNORMAL LOW (ref 22–32)
CO2: 19 mmol/L — ABNORMAL LOW (ref 22–32)
Calcium: 8.9 mg/dL (ref 8.9–10.3)
Calcium: 9 mg/dL (ref 8.9–10.3)
Calcium: 8.5 mg/dL — ABNORMAL LOW (ref 8.9–10.3)
Calcium: 8.6 mg/dL — ABNORMAL LOW (ref 8.9–10.3)
Chloride: >130 mmol/L (ref 98–111)
Chloride: >130 mmol/L (ref 98–111)
Chloride: 126 mmol/L — ABNORMAL HIGH (ref 98–111)
Chloride: 129 mmol/L — ABNORMAL HIGH (ref 98–111)
Creatinine, Ser: 2.97 mg/dL — ABNORMAL HIGH (ref 0.61–1.24)
Creatinine, Ser: 2.64 mg/dL — ABNORMAL HIGH (ref 0.61–1.24)
Creatinine, Ser: 2.48 mg/dL — ABNORMAL HIGH (ref 0.61–1.24)
Creatinine, Ser: 2.19 mg/dL — ABNORMAL HIGH (ref 0.61–1.24)
GFR, Estimated: 23 mL/min — ABNORMAL LOW (ref >60)
GFR, Estimated: 27 mL/min — ABNORMAL LOW (ref >60)
GFR, Estimated: 29 mL/min — ABNORMAL LOW (ref >60)
GFR, Estimated: 34 mL/min — ABNORMAL LOW (ref >60)
Glucose, Bld: 203 mg/dL — ABNORMAL HIGH (ref 70–99)
Glucose, Bld: 210 mg/dL — ABNORMAL HIGH (ref 70–99)
Glucose, Bld: 326 mg/dL — ABNORMAL HIGH (ref 70–99)
Glucose, Bld: 284 mg/dL — ABNORMAL HIGH (ref 70–99)
Potassium: 4.3 mmol/L (ref 3.5–5.1)
Potassium: 3.9 mmol/L (ref 3.5–5.1)
Potassium: 3.8 mmol/L (ref 3.5–5.1)
Potassium: 3.8 mmol/L (ref 3.5–5.1)
Sodium: 166 mmol/L (ref 135–145)
Sodium: 162 mmol/L (ref 135–145)
Sodium: 162 mmol/L (ref 135–145)
Sodium: 157 mmol/L — ABNORMAL HIGH (ref 135–145)

## 2024-01-12 LAB — ECHOCARDIOGRAM COMPLETE
Est EF: >75
Height: 66 in
S' Lateral: 2 cm
Weight: 3168 [oz_av]

## 2024-01-12 LAB — BLOOD CULTURE ID PANEL (REFLEXED) - BCID2

## 2024-01-12 LAB — GLUCOSE, CAPILLARY
Glucose-Capillary: 192 mg/dL — ABNORMAL HIGH (ref 70–99)
Glucose-Capillary: 181 mg/dL — ABNORMAL HIGH (ref 70–99)
Glucose-Capillary: 195 mg/dL — ABNORMAL HIGH (ref 70–99)
Glucose-Capillary: 257 mg/dL — ABNORMAL HIGH (ref 70–99)
Glucose-Capillary: 222 mg/dL — ABNORMAL HIGH (ref 70–99)
Glucose-Capillary: 194 mg/dL — ABNORMAL HIGH (ref 70–99)

## 2024-01-12 LAB — CBC
HCT: 26.8 % — ABNORMAL LOW (ref 39.0–52.0)
HCT: 25.8 % — ABNORMAL LOW (ref 39.0–52.0)
Hemoglobin: 8.1 g/dL — ABNORMAL LOW (ref 13.0–17.0)
Hemoglobin: 8 g/dL — ABNORMAL LOW (ref 13.0–17.0)
MCH: 31 pg (ref 26.0–34.0)
MCH: 31.4 pg (ref 26.0–34.0)
MCHC: 30.2 g/dL (ref 30.0–36.0)
MCHC: 31 g/dL (ref 30.0–36.0)
MCV: 102.7 fL — ABNORMAL HIGH (ref 80.0–100.0)
MCV: 101.2 fL — ABNORMAL HIGH (ref 80.0–100.0)
Platelets: 219 K/uL (ref 150–400)
Platelets: 229 K/uL (ref 150–400)
RBC: 2.61 MIL/uL — ABNORMAL LOW (ref 4.22–5.81)
RBC: 2.55 MIL/uL — ABNORMAL LOW (ref 4.22–5.81)
RDW: 15.5 % (ref 11.5–15.5)
RDW: 15.2 % (ref 11.5–15.5)
WBC: 19.7 K/uL — ABNORMAL HIGH (ref 4.0–10.5)
WBC: 14.3 K/uL — ABNORMAL HIGH (ref 4.0–10.5)
nRBC: 0 % (ref 0.0–0.2)
nRBC: 0.2 % (ref 0.0–0.2)

## 2024-01-12 LAB — C DIFFICILE QUICK SCREEN W PCR REFLEX
C Diff antigen: NEGATIVE
C Diff interpretation: NOT DETECTED
C Diff toxin: NEGATIVE

## 2024-01-12 LAB — BLOOD GAS, VENOUS
Acid-base deficit: 9.6 mmol/L — ABNORMAL HIGH (ref 0.0–2.0)
Bicarbonate: 16.5 mmol/L — ABNORMAL LOW (ref 20.0–28.0)
O2 Saturation: 69.1 %
Patient temperature: 36.3
pCO2, Ven: 35 mmHg — ABNORMAL LOW (ref 44–60)
pH, Ven: 7.28 (ref 7.25–7.43)
pO2, Ven: 39 mmHg (ref 32–45)

## 2024-01-12 MED ORDER — PIPERACILLIN-TAZOBACTAM 3.375 G IVPB
3.3750 g | Freq: Three times a day (TID) | INTRAVENOUS | Status: DC
  Administered 2024-01-12 – 2024-01-13 (×3): 3.375 g via INTRAVENOUS
  Filled 2024-01-12 (×3): qty 50

## 2024-01-12 MED ORDER — FOOD THICKENER (SIMPLYTHICK)
10.0000 | Freq: Once | ORAL | Status: AC
  Administered 2024-01-12: 10 via ORAL

## 2024-01-12 MED ORDER — DUTASTERIDE 0.5 MG PO CAPS
0.5000 mg | ORAL_CAPSULE | Freq: Every day | ORAL | Status: DC
  Administered 2024-01-12 – 2024-02-04 (×14): 0.5 mg via ORAL
  Filled 2024-01-12 (×27): qty 1

## 2024-01-12 MED ORDER — ASPIRIN 325 MG PO TABS
325.0000 mg | ORAL_TABLET | Freq: Every day | ORAL | Status: DC
  Administered 2024-01-12 – 2024-01-13 (×2): 325 mg via ORAL
  Filled 2024-01-12 (×2): qty 1

## 2024-01-12 MED ORDER — TAMSULOSIN HCL 0.4 MG PO CAPS
0.8000 mg | ORAL_CAPSULE | Freq: Every day | ORAL | Status: DC
  Administered 2024-01-12 – 2024-02-04 (×16): 0.8 mg via ORAL
  Filled 2024-01-12 (×26): qty 2

## 2024-01-12 MED ORDER — STROKE: EARLY STAGES OF RECOVERY BOOK
Freq: Once | Status: DC
  Filled 2024-01-12: qty 1

## 2024-01-12 MED ORDER — ATORVASTATIN CALCIUM 80 MG PO TABS
80.0000 mg | ORAL_TABLET | Freq: Every day | ORAL | Status: DC
  Administered 2024-01-13 – 2024-02-04 (×16): 80 mg via ORAL
  Filled 2024-01-12 (×23): qty 1

## 2024-01-12 MED ORDER — DEXTROSE 5 % IV SOLN: INTRAVENOUS | Status: DC

## 2024-01-12 MED ORDER — CLOPIDOGREL BISULFATE 75 MG PO TABS: 75.0000 mg | ORAL_TABLET | Freq: Every day | ORAL | Status: DC

## 2024-01-12 NOTE — TOC Initial Note (Signed)
 Transition of Care [facility]) - Initial/Assessment Note    Patient Details  Name: Ryan Hughes MRN: [id number] Date of Birth: 1966/03/28  Transition of [facility]) CM/SW Contact:    Gwenn Julien [place], [place] Phone Number: 01/12/2024, 1:54 PM  Clinical Narrative:   Pt admitted from [facility] where he is a LTC resident. Spoke to [employer] [employer] at [place] who confirmed pt is able to return including over weekend. SW will follow.   Julien Gwenn, MSW, LCSW [phone] (coverage)     Expected Discharge Plan: Skilled Nursing Facility Barriers to Discharge: Continued Medical Work up   Patient Goals and CMS Choice            Expected Discharge Plan and Services     Post Acute Care Choice: Skilled Nursing Facility Living arrangements for the past 2 months: Skilled Nursing Facility                                      Prior Living Arrangements/Services Living arrangements for the past 2 months: Skilled Nursing Facility Lives with:: Facility Resident                   Activities of Daily Living      Permission Sought/Granted                  Emotional Assessment       Orientation: :  (disoriented x4) Alcohol / Substance Use: Not Applicable Psych Involvement: No (comment)  Admission diagnosis:  Hypernatremia [E87.0] Acute renal failure (ARF) (HCC) [N17.9] AKI (acute kidney injury) (HCC) [N17.9] Hypotension due to hypovolemia [E86.1] Patient Active Problem List   Diagnosis Date Noted   Poor dentition 01/12/2024   Pressure injury of skin 01/12/2024   Acute renal failure (ARF) (HCC) 01/11/2024   Chronic suprapubic catheter (HCC) 01/11/2024   Acute hypernatremia 01/11/2024   Uremia 01/11/2024   Sleep apnea 01/11/2024   Normal anion gap metabolic acidosis 01/11/2024   Elevated troponin 01/11/2024   Dementia without behavioral disturbance (HCC) 01/11/2024   Depression 01/11/2024   Sepsis secondary to UTI due to chronic indwelling catheter  01/11/2024   Acute metabolic encephalopathy 01/11/2024   Unwitnessed fall 01/11/2024   Hx of AKA (above knee amputation), right 01/11/2024   Acute postoperative anemia due to expected blood loss 03/23/2022   Critical limb ischemia of right lower extremity (HCC) 03/22/2022   PAD (peripheral artery disease) (HCC) 02/17/2022   Hypomagnesemia 02/01/2022   Anemia of chronic disease 02/01/2022   Gangrene (HCC) 01/31/2022   Hypokalemia 12/16/2021   Nausea & vomiting 12/15/2021   Type 2 diabetes mellitus with vascular disease (HCC) 03/27/2021   Intractable nausea and vomiting 08/05/2020   HTN (hypertension) 08/05/2020   Mixed hyperlipidemia due to type 2 diabetes mellitus (HCC) 04/25/2020   Benign prostatic hyperplasia (BPH) with straining on urination 04/25/2020   Nicotine  dependence, cigarettes, uncomplicated 04/25/2020   History of CVA (cerebrovascular accident) 04/25/2020   Rash 03/19/2013   Urinary hesitancy 03/19/2013   Back spasm 05/16/2012   Foot pain 07/18/2011   Tingling in extremities 06/18/2011   PCP:  [name], MD Pharmacy:   [facility] [place] [facility] - [place], [place] - [address] [place] [address] Phone: [phone] Fax: [phone]     Social Drivers of Health (SDOH) Social History: SDOH Screenings   Food Insecurity: Patient Unable To Answer (01/11/2024)  Housing: Unknown (  01/11/2024)  Transportation Needs: Patient Unable To Answer (01/11/2024)  Utilities: Patient Unable To Answer (01/11/2024)  Depression (PHQ2-9): High Risk (12/08/2021)  Tobacco Use: Medium Risk (01/11/2024)   SDOH Interventions:     Readmission Risk Interventions     No data to display

## 2024-01-12 NOTE — Progress Notes (Signed)
 [facility] Progress Note Patient: Ryan Hughes FMW:[id number] DOB: 10-04-1965  DOA: 01/11/2024 DOS: the patient was seen and examined on 01/12/2024  Brief Hospital Course: Patient with PMH of dementia, depression, CVA, HLD, type II DM, PVD with right AKA, wheelchair-bound, BPH with suprapubic catheter present to the hospital with complaints of altered mental status. Presented with confusion. Currently being treated for UTI, AKI, hypernatremia, and stroke  Assessment and Plan: Acute renal failure Acute hypernatremia Uremia Normal anion gap metabolic acidosis Presents with confusion. Workup shows serum creatinine of 4, sodium of 162, bicarb 19. Creatinine improving with hydration. Continue with D5 for now. Unremarkable US  renal. Suprapubic catheter appears to be functioning okay.  Sepsis secondary to UTI due to chronic indwelling catheter Chronic suprapubic catheter  BPH Patient has chronic suprapubic catheter. Unknown last change time. Suprapubic tubing was changed in the ER. Will check UA. Presented with hypotension, leukocytosis, tachypnea with potential source of infection in the urine. Chest x-ray is clear. No other evidence of infection. Urine culture growing gram-negative rods. Switching from Zyvox  and Zosyn  to Zosyn  only.  Dementia without behavioral disturbance (HCC) Depression Acute metabolic encephalopathy History of CVA (cerebrovascular accident) Unwitnessed fall Acute CVA Primary reason for presentation is confusion. Suspect this is in the setting of metabolic abnormality with AKI as well as hyponatremia. Delirium in the setting of dementia in the setting of UTI cannot be ruled out as well. Metabolic workup not remarkable ammonia, TSH, B1, B12, folic acid . CT head negative for any acute stroke. MRI shows multiple acute lacunar stroke. Neurology consulted. Resuming DAPT. SLP PT OT following. Holding home medications for depression and  dementia.  HTN Presents with hypotension. Currently holding home medications. Continue with aggressive IV hydration.  Type 2 diabetes mellitus with vascular disease (HCC) On sliding scale insulin  and metformin  and Trulicity . For now continuing only sliding scale.  Anemia of chronic disease Hemoglobin stable. For now monitor. Anticipating dilutional drop in his hemoglobin after aggressive IV hydration.  PAD (peripheral artery disease) (HCC) On aspirin  Plavix . Also has history of AKA on the right. Monitor for now.  Sleep apnea Patient has apneic spell while in the ED on my evaluation. No prior history of sleep apnea. CT scan of the head ordered. BiPAP as needed.  Elevated troponin Likely in the setting of poor clearance due to AKI rather than demand ischemia. Will monitor serial troponin. No evidence of EKG evidence of ischemia.   Subjective: RN reported diarrhea.  No nausea vomiting fever chills or any chest pain.  Physical Exam: Clear to auscultation. S1-S2 present Bowel sound present Alert today, nonverbal, withdraws to painful stimuli on the lower extremity, somewhat weak in the left upper extremity.  Data Reviewed: I have Reviewed nursing notes, Vitals, and Lab results. Since last encounter, pertinent lab results CBC and BMP   . I have ordered test including CBC and BMP  . I have discussed pt's care plan and test results with palliative care and neurology  .   Disposition: Status is: Inpatient Remains inpatient appropriate because: Monitor for improvement in mentation and sodium level  heparin  injection 5,000 Units Start: 01/12/24 0600   Family Communication: Family at bedside Level of care: Progressive   Vitals:   01/12/24 0841 01/12/24 0842 01/12/24 1220 01/12/24 1526  BP: 107/72 107/72 109/67 (!) 145/68  Pulse: 76 81 80 90  Resp: 12 18 14 16   Temp: 97.9 F (36.6 C) 97.9 F (36.6 C) 97.6 F (36.4 C) 98.3 F (36.8 C)  TempSrc: Axillary  Axillary Oral  Oral  SpO2:   100% 100%  Weight:      Height:        The patient is critically ill with multiple organ systems failure and requires high complexity decision making for assessment and support, frequent evaluation and titration of therapies. Critical Care Time devoted to patient care services described in this note is 35 minutes   Author: Yetta Blanch, MD 01/12/2024 6:58 PM  Please look on [email or web address] to find out who is on call.

## 2024-01-12 NOTE — Evaluation (Signed)
 Physical Therapy Evaluation Patient Details Name: Ryan Hughes MRN: [id number] DOB: 1966/03/05 Today's Date: 01/12/2024  History of Present Illness  58 yo presenting to [facility] from [facility] due to increasing AMS and sweating x 4 days. Pt hypotensive on arrival. Pt found with acute renal failure, hypernatremia, metabolic acidosis with normal anion gap, sepsis, acute metabolic encephalopathy. MRI (+) Acute lacunar infaracts involving L frontal, L posterior temporal/ parietal lobe and single acute lacunar infarct R frontal lobe. PMH includes CVA w/ L hemiplegia and aphasia, HTN, DMII.  Clinical Impression  Pt is presenting slightly below baseline level of functioning. Currently pt is Total A for supine<>sitting, Max A for rolling and Max A for sitting EOB. Pt with prior deficits requiring a hoyer lift for transfers; able to sit up in W/C. Family is supportive and at bedside. Due to pt current functional status, home set up and available assistance at home recommending skilled physical therapy services < 3 hours/day in order to address strength, balance and functional mobility to decrease risk for falls, injury, immobility, skin break down and re-hospitalization.          If plan is discharge home, recommend the following: Two people to help with walking and/or transfers;Assist for transportation;Assistance with cooking/housework;Supervision due to cognitive status   Can travel by private vehicle   No    Equipment Recommendations None recommended by PT     Functional Status Assessment Patient has had a recent decline in their functional status and demonstrates the ability to make significant improvements in function in a reasonable and predictable amount of time.     Precautions / Restrictions Precautions Precautions: Fall Recall of Precautions/Restrictions: Impaired Restrictions Weight Bearing Restrictions Per Provider Order: No      Mobility  Bed Mobility Overal bed  mobility: Needs Assistance Bed Mobility: Rolling, Sidelying to Sit, Sit to Sidelying Rolling: Max assist, Used rails Sidelying to sit: Total assist, +2 for physical assistance     Sit to sidelying: Total assist, +2 for physical assistance General bed mobility comments: +2 for physical assist to get from supine<>stitting. Pt able to assist with reaching for the rail and rolling with heavy tactile initiation cues into the roll to cue pt to begin movement to R and L.    Transfers   General transfer comment: Unable at this time. Pt is hoyer lift at baseline.       Balance Overall balance assessment: Needs assistance Sitting-balance support: Bilateral upper extremity supported, Feet supported Sitting balance-Leahy Scale: Poor Sitting balance - Comments: Heavy Max A to remain sitting EOB; pt leaning to the R.       Pertinent Vitals/Pain Pain Assessment Pain Assessment: Faces Faces Pain Scale: Hurts even more Breathing: occasional labored breathing, short period of hyperventilation Negative Vocalization: repeated troubled calling out, loud moaning/groaning, crying Facial Expression: sad, frightened, frown Body Language: tense, distressed pacing, fidgeting Consolability: no need to console PAINAD Score: 5 Pain Location: LLE Pain Descriptors / Indicators: Grimacing, Guarding Pain Intervention(s): Monitored during session, Limited activity within patient's tolerance, Repositioned    Home Living Family/patient expects to be discharged to:: Skilled nursing facility   Additional Comments: Pt is from [place] skilled nursing facility. Prior to hospitalization was able to converse with family about other family members.    Prior Function Prior Level of Function : Needs assist       Physical Assist : Mobility (physical);ADLs (physical) Mobility (physical): Bed mobility;Transfers ADLs (physical): Feeding;Grooming;Bathing;Dressing;Toileting;IADLs Mobility Comments: pt is lift to W/C  per  family. Able to navigate W/C ADLs Comments: Pt requires assist for all ADL's/IADLs     Extremity/Trunk Assessment   Upper Extremity Assessment Upper Extremity Assessment: Defer to OT evaluation    Lower Extremity Assessment Lower Extremity Assessment: Generalized weakness;RLE deficits/detail RLE Deficits / Details: AKA    Cervical / Trunk Assessment Cervical / Trunk Assessment: Normal  Communication   Communication Communication: Other (comment) Factors Affecting Communication: Difficulty expressing self    Cognition Arousal: Lethargic, Alert Behavior During Therapy: Flat affect   PT - Cognitive impairments: History of cognitive impairments, Sequencing, Awareness, Attention, Initiation, Difficult to assess Difficult to assess due to: Impaired communication   PT - Cognition Comments: Pt able to point to which button calls the nurse; asked for water . Otherwise non-verbal with occasional moan/groan with movement of the LLE. Pt intermittently able to follow directions needs heavy initiation cues Following commands: Impaired Following commands impaired: Follows one step commands inconsistently, Follows one step commands with increased time     Cueing Cueing Techniques: Verbal cues, Tactile cues, Visual cues     General Comments General comments (skin integrity, edema, etc.): Pt with some reddness noted at sacral area/buttocks        Assessment/Plan    PT Assessment Patient needs continued PT services  PT Problem List Decreased activity tolerance;Decreased balance;Decreased mobility;Decreased strength;Pain       PT Treatment Interventions DME instruction;Functional mobility training;Therapeutic activities;Therapeutic exercise;Balance training;Neuromuscular re-education;Patient/family education;Wheelchair mobility training    PT Goals (Current goals can be found in the Care Plan section)  Acute Rehab PT Goals Patient Stated Goal: To return to SNF PT Goal  Formulation: Patient unable to participate in goal setting Time For Goal Achievement: 01/26/24 Potential to Achieve Goals: Fair Additional Goals Additional Goal #1: Pt will be able to navigate W/C at Mod A for 10 ft in order to increase independence with any combination of UE/LE assist.    Frequency Min 1X/week     Co-evaluation PT/OT/SLP Co-Evaluation/Treatment: Yes Reason for Co-Treatment: Complexity of the patient's impairments (multi-system involvement);Necessary to address cognition/behavior during functional activity;For patient/therapist safety;To address functional/ADL transfers PT goals addressed during session: Balance;Mobility/safety with mobility OT goals addressed during session: ADL's and self-care       AM-PAC PT 6 Clicks Mobility  Outcome Measure Help needed turning from your back to your side while in a flat bed without using bedrails?: A Lot Help needed moving from lying on your back to sitting on the side of a flat bed without using bedrails?: Total Help needed moving to and from a bed to a chair (including a wheelchair)?: Total Help needed standing up from a chair using your arms (e.g., wheelchair or bedside chair)?: Total Help needed to walk in hospital room?: Total Help needed climbing 3-5 steps with a railing? : Total 6 Click Score: 7    End of Session   Activity Tolerance: Patient limited by lethargy Patient left: in bed;with call bell/phone within reach;with bed alarm set;with family/visitor present Nurse Communication: Mobility status PT Visit Diagnosis: Other abnormalities of gait and mobility (R26.89)    Time: 8760-8697 PT Time Calculation (min) (ACUTE ONLY): 23 min   Charges:   PT Evaluation $PT Eval Low Complexity: 1 Low   PT General Charges $$ ACUTE PT VISIT: 1 Visit       Dorothyann Maier, DPT, CLT  Acute Rehabilitation Services Office: [phone] (Secure chat preferred)   Dorothyann VEAR Maier 01/12/2024, 1:18 PM

## 2024-01-12 NOTE — Progress Notes (Signed)
  Echocardiogram 2D Echocardiogram has been performed.  Ryan Hughes 01/12/2024, 5:36 PM

## 2024-01-12 NOTE — Progress Notes (Signed)
 PHARMACY NOTE:  ANTIMICROBIAL RENAL DOSAGE ADJUSTMENT  Current antimicrobial regimen includes a mismatch between antimicrobial dosage and estimated renal function.  As per policy approved by the Pharmacy & Therapeutics and Medical Executive Committees, the antimicrobial dosage will be adjusted accordingly.  Current antimicrobial dosage:  Zosyn  2.25 q8hr  Indication: Sepsis  Renal Function:  Estimated Creatinine Clearance: 28.1 mL/min (A) (by C-G formula based on SCr of 2.97 mg/dL (H)).    Antimicrobial dosage has been changed to:  Zosyn  3.375 gm IV q8hr  Thank you for allowing pharmacy to be a part of this patient's care.  Dorothyann DELENA Alert, [facility] 01/12/2024 7:40 AM

## 2024-01-12 NOTE — Consult Note (Signed)
 Palliative Care Consult Note                                  Date: 01/12/2024   Patient Name: Ryan Hughes  DOB: March 30, 1966  MRN: [id number]  Age / Sex: 58 y.o., male  PCP: [name], MD Referring Physician: Tobie Yetta HERO, MD  Reason for Consultation: Establishing goals of care  HPI/Patient Profile: 58 y.o. male  with past medical history of dementia, CVA w/ L hemiplegia and aphasia, HLD, HTN, DM2, PVD with right AKA, BPH with suprapubic catheter admitted from [facility] on 01/11/2024 with increasing AMS and sweating.   Pt found with acute renal failure, hypernatremia, metabolic acidosis with normal anion gap, sepsis secondary to UTI with suprapubic catheter, acute metabolic encephalopathy. MRI (+) Acute lacunar infaracts involving L frontal, L posterior temporal/ parietal lobe and single acute lacunar infarct R frontal lobe.   Past Medical History:  Diagnosis Date   Acute arterial ischemic stroke, vertebrobasilar, thalamic, left (HCC) 04/25/2020   Acute CVA (cerebrovascular accident) (HCC) 03/27/2021   BPH (benign prostatic hyperplasia)    Diabetes mellitus without complication (HCC)    HLD (hyperlipidemia)    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   Hypertension    Nicotine  dependence, cigarettes, uncomplicated 04/25/2020   Sleep apnea 01/11/2024   Stroke [facility])     Subjective:   I have reviewed medical records including EPIC notes, labs and imaging, received update from attending team, assessed the patient and then met with the patient's family- HCPOA/niece Ryan Hughes, nephew, and niece to discuss diagnosis prognosis, GOC, EOL wishes, disposition and options.  I introduced Palliative Medicine as specialized medical care for people living with serious illness. It focuses on providing relief from symptoms and stress of a serious illness. The goal is to improve quality of life for both  the patient and the family.  Today's Discussion: Patient lying awake in bed. He appears comfortable and in no apparent distress. He has the television on. He does not respond when we greet him or ask him questions. No family at bedside. PT plans to work with patient today.  Called patient's HCPOA [name] Parson's she has returned to the room. Met in patient's room with [name] and two other family members. We discussed the patient's chronic conditions. Discussed the natural disease trajectory and that dementia is a terminal disease. Shared my concern that he is at a high risk for rehospitalization. Discussed patient's current hospitalization including his altered mental status, sepsis, and acute renal failure.   Prior to this admission the patient was living at [facility]. His family shared he had recently been the best he has been in some time. He was able to get out of bed and push himself around in his wheelchair. Patient was talkative and would recognize family members. They estimate he was eating greater than 50% of his meals. The patient has never married or had children. His family consists of his sister, nieces, and nephew. He enjoyed working in [employer] business until his stroke forced him to stop working. He enjoyed playing cards and talking with  others.  A discussion was had today regarding advanced directives. We discussed code status. Recommended consideration of DNR status, understanding evidenced-based poor outcomes in similar hospitalized patients, as the cause of the arrest is likely associated with chronic/terminal disease rather than a reversible acute cardio-pulmonary event. Patient's family feel strongly that the patient would want an attempted resuscitation. Patient remains full code. They believe he would want to continue treating the treatable-- patient remains full scope of care. The family shared they do not believe he would want to be on life support long  term.   Discussed the importance of continued conversation with family and the medical providers regarding overall plan of care and treatment options, ensuring decisions are within the context of the patient's values and GOCs. Encouraged family to consider his quality of life and what would be acceptable to him in the future.   Questions and concerns were addressed. Hard Choices booklet left for review. The family was encouraged to call with questions or concerns. PMT will continue to support holistically.  Review of Systems  Unable to perform ROS   Objective:   Primary Diagnoses: Present on Admission:  Acute renal failure (ARF) (HCC)  Anemia of chronic disease  Benign prostatic hyperplasia (BPH) with straining on urination  HTN (hypertension)  PAD (peripheral artery disease) (HCC)  Type 2 diabetes mellitus with vascular disease (HCC)  Acute hypernatremia  Uremia  Sleep apnea  Normal anion gap metabolic acidosis  Elevated troponin  Dementia without behavioral disturbance (HCC)  Depression  Sepsis secondary to UTI due to chronic indwelling catheter  Acute metabolic encephalopathy  Unwitnessed fall  Pressure injury of skin   Physical Exam Vitals reviewed.  Constitutional:      General: He is not in acute distress.    Appearance: He is ill-appearing.  HENT:     Head: Normocephalic and atraumatic.  Cardiovascular:     Rate and Rhythm: Normal rate.  Pulmonary:     Effort: Pulmonary effort is normal.  Skin:    General: Skin is warm and dry.  Neurological:     Mental Status: He is alert.  Psychiatric:        Mood and Affect: Affect is flat.     Comments: Does not respond to questions     Vital Signs:  BP 109/67 (BP Location: Left Arm)   Pulse 80   Temp 97.6 F (36.4 C) (Oral)   Resp 14   Ht 5' 6 (1.676 m)   Wt 89.8 kg   SpO2 100%   BMI 31.96 kg/m    Advanced Care Planning:   Existing Vynca/ACP Documentation: None  Primary Decision Maker: HCPOA is  patient's niece Ryan Hughes.  Code Status/Advance Care Planning: Full code   Assessment & Plan:   SUMMARY OF RECOMMENDATIONS   Full code Full scope Discharge back to [place] Encouraged family to continue discussions re: goals of care as patient's health or functional status changes Continued PMT support   Discussed with: bedside RN, PT, and Dr. Tobie  Time Total: 80 minutes    Thank you for allowing us  to participate in the care of [employer] PMT will continue to support holistically.  Signed by: Stephane Palin, NP Palliative Medicine Team  Team Phone # [phone] (Nights/Weekends)  01/12/2024, 2:49 PM

## 2024-01-12 NOTE — Plan of Care (Signed)
  Problem: Education: Goal: Ability to describe self-care measures that may prevent or decrease complications (Diabetes Survival Skills Education) will improve Outcome: Not Progressing Goal: Individualized Educational Video(s) Outcome: Not Progressing   Problem: Coping: Goal: Ability to adjust to condition or change in health will improve Outcome: Not Progressing   Problem: Fluid Volume: Goal: Ability to maintain a balanced intake and output will improve Outcome: Not Progressing   Problem: Health Behavior/Discharge Planning: Goal: Ability to identify and utilize available resources and services will improve Outcome: Not Progressing Goal: Ability to manage health-related needs will improve Outcome: Not Progressing   Problem: Nutritional: Goal: Maintenance of adequate nutrition will improve Outcome: Not Progressing

## 2024-01-12 NOTE — Progress Notes (Signed)
 PHARMACY - PHYSICIAN COMMUNICATION CRITICAL VALUE ALERT - BLOOD CULTURE IDENTIFICATION (BCID)  Ryan Hughes is an 58 y.o. male who presented to [facility] on 01/11/2024 with a chief complaint of altered mental status in the setting of suprapubic catheter in place.   Assessment:  blood cx = GPC clusters, 1/4 bottles (aerobic), BCID = Staph epi, likely a contaminant. Current infectious workup is for UTI, no previous urine cultures documented with bacteria upon chart review. (include suspected source if known)  Name of physician (or Provider) Contacted: Dr. Yetta Blanch  Current antibiotics: pip/tazo, linezolid   Changes to prescribed antibiotics recommended: ceftriaxone monotherapy Recommendations declined by provider due to concerns for atypical UTI organisms and lack of clinical improvement, provider wants to continue with broader coverage until urine cultures result for now.   Results for orders placed or performed during the hospital encounter of 01/11/24  Blood Culture ID Panel (Reflexed) (Collected: 01/11/2024  2:02 PM)  Result Value Ref Range   Enterococcus faecalis NOT DETECTED NOT DETECTED   Enterococcus Faecium NOT DETECTED NOT DETECTED   Listeria monocytogenes NOT DETECTED NOT DETECTED   Staphylococcus species DETECTED (A) NOT DETECTED   Staphylococcus aureus (BCID) NOT DETECTED NOT DETECTED   Staphylococcus epidermidis DETECTED (A) NOT DETECTED   Staphylococcus lugdunensis NOT DETECTED NOT DETECTED   Streptococcus species NOT DETECTED NOT DETECTED   Streptococcus agalactiae NOT DETECTED NOT DETECTED   Streptococcus pneumoniae NOT DETECTED NOT DETECTED   Streptococcus pyogenes NOT DETECTED NOT DETECTED   A.calcoaceticus-baumannii NOT DETECTED NOT DETECTED   Bacteroides fragilis NOT DETECTED NOT DETECTED   Enterobacterales NOT DETECTED NOT DETECTED   Enterobacter cloacae complex NOT DETECTED NOT DETECTED   Escherichia coli NOT DETECTED NOT DETECTED   Klebsiella aerogenes NOT  DETECTED NOT DETECTED   Klebsiella oxytoca NOT DETECTED NOT DETECTED   Klebsiella pneumoniae NOT DETECTED NOT DETECTED   Proteus species NOT DETECTED NOT DETECTED   Salmonella species NOT DETECTED NOT DETECTED   Serratia marcescens NOT DETECTED NOT DETECTED   Haemophilus influenzae NOT DETECTED NOT DETECTED   Neisseria meningitidis NOT DETECTED NOT DETECTED   Pseudomonas aeruginosa NOT DETECTED NOT DETECTED   Stenotrophomonas maltophilia NOT DETECTED NOT DETECTED   Candida albicans NOT DETECTED NOT DETECTED   Candida auris NOT DETECTED NOT DETECTED   Candida glabrata NOT DETECTED NOT DETECTED   Candida krusei NOT DETECTED NOT DETECTED   Candida parapsilosis NOT DETECTED NOT DETECTED   Candida tropicalis NOT DETECTED NOT DETECTED   Cryptococcus neoformans/gattii NOT DETECTED NOT DETECTED   Methicillin resistance mecA/C NOT DETECTED NOT DETECTED    Feliciano Close, PharmD PGY2 Infectious Diseases Pharmacy Resident  01/12/2024 11:46 AM

## 2024-01-12 NOTE — Evaluation (Signed)
 Clinical/Bedside Swallow Evaluation Patient Details  Name: Ryan Hughes MRN: [id number] Date of Birth: [birth year]  Today's Date: 01/12/2024 Time: SLP Start Time (ACUTE ONLY): 9156 SLP Stop Time (ACUTE ONLY): 9072 SLP Time Calculation (min) (ACUTE ONLY): 44 min  Past Medical History:  Past Medical History:  Diagnosis Date   Acute arterial ischemic stroke, vertebrobasilar, thalamic, left (HCC) 04/25/2020   Acute CVA (cerebrovascular accident) (HCC) 03/27/2021   BPH (benign prostatic hyperplasia)    Diabetes mellitus without complication (HCC)    HLD (hyperlipidemia)    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   Hypertension    Nicotine  dependence, cigarettes, uncomplicated 04/25/2020   Sleep apnea 01/11/2024   Stroke [facility])    Past Surgical History:  Past Surgical History:  Procedure Laterality Date   ABDOMINAL AORTOGRAM W/LOWER EXTREMITY N/A 02/02/2022   Procedure: ABDOMINAL AORTOGRAM W/LOWER EXTREMITY;  Surgeon: Lanis Fonda BRAVO, MD;  Location: [facility] INVASIVE CV LAB;  Service: Cardiovascular;  Laterality: N/A;   AMPUTATION Right 02/17/2022   Procedure: AMPUTATION DIGIT GREAT AND SECOND TOES;  Surgeon: Sheree Penne Bruckner, MD;  Location: [facility] OR;  Service: Vascular;  Laterality: Right;   AMPUTATION Right 03/22/2022   Procedure: AMPUTATION ABOVE KNEE;  Surgeon: Sheree Penne Bruckner, MD;  Location: [facility] OR;  Service: Vascular;  Laterality: Right;   BYPASS GRAFT FEMORAL-PERONEAL Right 02/17/2022   Procedure: Right BYPASS GRAFT GURNEY;  Surgeon: Sheree Penne Bruckner, MD;  Location: [facility] OR;  Service: Vascular;  Laterality: Right;   ENDARTERECTOMY FEMORAL Right 02/17/2022   Procedure: ENDARTERECTOMY FEMORAL RIGHT;  Surgeon: Sheree Penne Bruckner, MD;  Location: [facility] OR;  Service: Vascular;  Laterality: Right;   INSERTION OF ILIAC STENT  02/17/2022   Procedure: INSERTION OF PERONEAL STENT;  Surgeon: Sheree Penne Bruckner, MD;  Location: [facility] OR;   Service: Vascular;;   IR CATHETER TUBE CHANGE  09/19/2022   LOWER EXTREMITY ANGIOGRAM  02/17/2022   Procedure: LOWER EXTREMITY ANGIOGRAM;  Surgeon: Sheree Penne Bruckner, MD;  Location: [facility] OR;  Service: Vascular;;   HPI:  58 y.o. male presented from SNF with AMS. Dx sepsis secondary to UTI with chronic suprapubic catheter, ARF, hypernatremia, uremia. Recent poor PO intake and dehydration per notes. PMHx dementia, prior CVA, depression, HLD, PVD with right AKA. Prior MBS 12/17/21- regular diet, overall weakness of pharyngeal constriction, tongue propulsion, premature spillage. No aspiration.    Assessment / Plan / Recommendation  Clinical Impression  Ryan Hughes presents with concerns for an oropharyngeal dysphagia, potentially transient in nature and related to MS changes.  Oral mechanism exam reveals loose central upper incisors. Pt would not allow oral care.  Pt able to follow simple oral motor commands. He was able to hold a cup with RUE and drink liquids from a straw - thiin liquids elicitied consistent cough over multiple trials, concerning for potential aspiration. Nectar thick liquids did not cause coughing.  He accepted applesauce with excessive oral manipulation but no residue after the swallow.    By the end of the session, he verbalized occasional single word responses, shook his head yes/no in response to questions about personal needs, and was able to identify the red nurse button on the call bell when asked how he could reach the nurse if he needed help.   Recommend starting POs - dysphagia 1/nectar thick liquids.  SLP will follow with the plan to advance diet as mental status returns to baseline. He may need an MBS. D/W RN.   SLP Visit Diagnosis: Dysphagia, oropharyngeal phase (R13.12)  Aspiration Risk       Diet Recommendation   Nectar;Dysphagia 1 (puree)  Medication Administration: Whole meds with puree    Other  Recommendations Oral Care Recommendations: Oral care  BID     Assistance Recommended at Discharge    Functional Status Assessment Patient has had a recent decline in their functional status and demonstrates the ability to make significant improvements in function in a reasonable and predictable amount of time.  Frequency and Duration min 2x/week  1 week       Prognosis Prognosis for improved oropharyngeal function: Good      Swallow Study   General Date of Onset: 01/11/24 HPI: 58 y.o. male presented from SNF with AMS. Dx sepsis secondary to UTI with chronic suprapubic catheter, ARF, hypernatremia, uremia. Recent poor PO intake and dehydration per notes. PMHx dementia, prior CVA, depression, HLD, PVD with right AKA. Prior MBS 12/17/21- regular diet, overall weakness of pharyngeal constriction, tongue propulsion, premature spillage. No aspiration. Type of Study: Bedside Swallow Evaluation Previous Swallow Assessment: see HPI Diet Prior to this Study: NPO Temperature Spikes Noted: No Respiratory Status: Room air History of Recent Intubation: No Behavior/Cognition: Alert Oral Cavity Assessment: Other (comment) (upper central incisors loose) Oral Care Completed by SLP: Other (Comment) (pt would not allow oral care) Oral Cavity - Dentition: Poor condition;Missing dentition;Other (Comment) (loose top central incisors) Self-Feeding Abilities: Needs assist;Able to feed self Patient Positioning: Upright in bed Baseline Vocal Quality: Not observed Volitional Cough: Cognitively unable to elicit Volitional Swallow: Unable to elicit    Oral/Motor/Sensory Function Overall Oral Motor/Sensory Function: Other (comment) (symmetry at baseline)   Ice Chips Ice chips: Within functional limits   Thin Liquid Thin Liquid: Impaired Presentation: Cup;Straw Pharyngeal  Phase Impairments: Cough - Immediate    Nectar Thick Nectar Thick Liquid: Within functional limits   Honey Thick Honey Thick Liquid: Not tested   Puree Puree: Impaired Presentation:  Spoon Oral Phase Functional Implications:  (appearance of disorganized oral phase)   Solid     Solid: Not tested      Vona Palma Laurice 01/12/2024,9:40 AM  Palma L. Vona, MA CCC/SLP Clinical Specialist - Acute Care SLP Acute Rehabilitation Services Office number [phone]

## 2024-01-12 NOTE — Evaluation (Signed)
 Occupational Therapy Evaluation Patient Details Name: Ryan Hughes MRN: [id number] DOB: [birth year] Today's Date: 01/12/2024   History of Present Illness   58 yo presenting to [facility] from [facility] due to increasing AMS and sweating x 4 days. Pt hypotensive on arrival. Pt found with acute renal failure, hypernatremia, metabolic acidosis with normal anion gap, sepsis, acute metabolic encephalopathy. MRI (+) Acute lacunar infaracts involving L frontal, L posterior temporal/ parietal lobe and single acute lacunar infarct R frontal lobe. PMH includes CVA w/ L hemiplegia and aphasia, HTN, DMII.     Clinical Impressions PT admitted with AMS with new MRI (+) for bil acute infarcts frontal lobe. Pt currently with functional limitiations due to the deficits listed below (see OT problem list). Pt at baseline from snf with wheelchair bound hoyer dependent mobility. Pt typically conversational with family and at this time not communicating with family. Pt will benefit from skilled OT to increase their independence and safety with adls and balance to allow discharge skilled inpatient follow up therapy, <3 hours/day.  Call me Brandyn      If plan is discharge home, recommend the following:   Two people to help with bathing/dressing/bathroom     Functional Status Assessment   Patient has had a recent decline in their functional status and demonstrates the ability to make significant improvements in function in a reasonable and predictable amount of time.     Equipment Recommendations   Wheelchair (measurements OT);Wheelchair cushion (measurements OT);Hospital bed;Hoyer lift     Recommendations for Other Services   Speech consult     Precautions/Restrictions   Precautions Precautions: Fall Recall of Precautions/Restrictions: Impaired Restrictions Weight Bearing Restrictions Per Provider Order: Yes RLE Weight Bearing Per Provider Order: Non weight bearing      Mobility Bed Mobility Overal bed mobility: Needs Assistance Bed Mobility: Rolling, Supine to Sit, Sit to Supine Rolling: +2 for physical assistance, Mod assist   Supine to sit: +2 for physical assistance, Mod assist Sit to supine: +2 for physical assistance, Mod assist   General bed mobility comments: pt with pad used to help facilitate rolling toward R side and L side. pt with initiate of UE grabbing bed pt pushing off with R UE for sitting. pt leaning into HOB elevated fully on R side at EOB with LLE in extension. pt with pain on L side posteriorly but difficult to fully assess due to decreased verbalizations. pt requires (A) to return to supine. total +2 (A) to pull up in the bed    Transfers                   General transfer comment: not appropriate      Balance Overall balance assessment: Needs assistance Sitting-balance support: Bilateral upper extremity supported, Feet supported Sitting balance-Leahy Scale: Poor Sitting balance - Comments: R lean with prolonged sitting                                   ADL either performed or assessed with clinical judgement   ADL Overall ADL's : Needs assistance/impaired Eating/Feeding: Moderate assistance   Grooming: Maximal assistance   Upper Body Bathing: Total assistance   Lower Body Bathing: Total assistance   Upper Body Dressing : Total assistance   Lower Body Dressing: Total assistance                 General ADL Comments: pt with suprpubic in  take and dry at this time     Vision   Additional Comments: in functional assessment pt needing items toward midline and R visual field to initiate     Perception         Praxis         Pertinent Vitals/Pain Pain Assessment Pain Assessment: Faces Faces Pain Scale: Hurts even more Pain Location: L side ( L hip/ leg) Pain Descriptors / Indicators: Discomfort, Grimacing, Guarding Pain Intervention(s): Monitored during session, Repositioned,  Limited activity within patient's tolerance     Extremity/Trunk Assessment Upper Extremity Assessment Upper Extremity Assessment: Defer to OT evaluation   Lower Extremity Assessment Lower Extremity Assessment: Generalized weakness;RLE deficits/detail RLE Deficits / Details: AKA   Cervical / Trunk Assessment Cervical / Trunk Assessment: Normal   Communication Communication Communication: Other (comment) Factors Affecting Communication: Difficulty expressing self   Cognition Arousal: Alert Behavior During Therapy: Flat affect Cognition: History of cognitive impairments             OT - Cognition Comments: pt nods head yes twice during session for name and pain. pt states i need water  at the end of session Pt normally very talkive per family and asks questions about other family members. pt is not speaking to family present in room now which they note as a change. pt requires max cues to initiate task                 Following commands: Intact       Cueing  General Comments   Cueing Techniques: Verbal cues  Pt with some reddness noted at sacral area/buttocks   Exercises     Shoulder Instructions      Home Living Family/patient expects to be discharged to:: Skilled nursing facility                                 Additional Comments: Pt is from [facility] skilled nursing facility. Prior to hospitalization was able to converse with family about other family members.      Prior Functioning/Environment Prior Level of Function : Needs assist       Physical Assist : Mobility (physical);ADLs (physical) Mobility (physical): Bed mobility;Transfers ADLs (physical): Feeding;Grooming;Bathing;Dressing;Toileting;IADLs Mobility Comments: pt is lift to W/C per family. Able to navigate W/C ADLs Comments: Pt requires assist for all ADL's/IADLs    OT Problem List: Decreased activity tolerance;Decreased strength;Impaired balance (sitting and/or  standing);Decreased cognition;Decreased safety awareness;Decreased knowledge of use of DME or AE;Decreased knowledge of precautions;Cardiopulmonary status limiting activity   OT Treatment/Interventions: Self-care/ADL training;DME and/or AE instruction;Therapeutic activities;Cognitive remediation/compensation;Patient/family education;Balance training;Therapeutic exercise      OT Goals(Current goals can be found in the care plan section)   Acute Rehab OT Goals Patient Stated Goal:  i need water  OT Goal Formulation: Patient unable to participate in goal setting Time For Goal Achievement: 01/26/24 Potential to Achieve Goals: Fair   OT Frequency:  Min 1X/week    Co-evaluation PT/OT/SLP Co-Evaluation/Treatment: Yes Reason for Co-Treatment: Complexity of the patient's impairments (multi-system involvement);Necessary to address cognition/behavior during functional activity;For patient/therapist safety;To address functional/ADL transfers   OT goals addressed during session: ADL's and self-care      AM-PAC OT 6 Clicks Daily Activity     Outcome Measure Help from another person eating meals?: A Lot Help from another person taking care of personal grooming?: A Lot Help from another person toileting, which includes using toliet, bedpan, or urinal?: Total  Help from another person bathing (including washing, rinsing, drying)?: Total Help from another person to put on and taking off regular upper body clothing?: Total Help from another person to put on and taking off regular lower body clothing?: Total 6 Click Score: 8   End of Session Nurse Communication: Mobility status;Precautions;Weight bearing status;Need for lift equipment  Activity Tolerance: Patient tolerated treatment well Patient left: in bed;with call bell/phone within reach;with bed alarm set;with family/visitor present  OT Visit Diagnosis: Unsteadiness on feet (R26.81);Muscle weakness (generalized) (M62.81)                 Time: 8764-8698 OT Time Calculation (min): 26 min Charges:  OT General Charges $OT Visit: 1 Visit OT Evaluation $OT Eval Moderate Complexity: 1 Mod   Brynn, OTR/L  Acute Rehabilitation Services Office: [phone] .   Ely Molt 01/12/2024, 1:18 PM

## 2024-01-12 NOTE — Consult Note (Signed)
 NEUROLOGY CONSULT NOTE   Date of service: January 12, 2024 Patient Name: Ryan Hughes MRN:  [id number] DOB:  02-04-1966 Chief Complaint: Stroke on MRI Requesting Provider: Tobie Yetta HERO, MD  History of Present Illness  Ryan Hughes is a 58 y.o. male with hx of left thalamic stroke, BPH, diabetes, hyperlipidemia, hypertension, right AKA who initially presented to the hospital 9/4 with hypernatremia, UTI, and AKI.  Initially on arrival he was unresponsive improved and then worsened again yesterday.  MRI was done that shows several acute lacunar infarcts in the left frontal and left posterior temporal/parietal lobes.  Family states that yesterday he was mainly unresponsive and then today he has been slightly more interactive.  He did come from a skilled nursing facility.  NIHSS components Score: Comment  1a Level of Conscious 0[x]  1[]  2[]  3[]      1b LOC Questions 0[]  1[]  2[x]       1c LOC Commands 0[x]  1[]  2[]       2 Best Gaze 0[x]  1[]  2[]       3 Visual 0[x]  1[]  2[]  3[]      4 Facial Palsy 0[x]  1[]  2[]  3[]      5a Motor Arm - left 0[x]  1[]  2[]  3[]  4[]  UN[]    5b Motor Arm - Right 0[x]  1[]  2[]  3[]  4[]  UN[]    6a Motor Leg - Left 0[]  1[]  2[]  3[]  4[x]  UN[]    6b Motor Leg - Right 0[]  1[]  2[]  3[x]  4[]  UN[]    7 Limb Ataxia 0[x]  1[]  2[]  UN[]      8 Sensory 0[x]  1[]  2[]  UN[]      9 Best Language 0[]  1[]  2[]  3[x]      10 Dysarthria 0[]  1[]  2[x]  UN[]      11 Extinct. and Inattention 0[x]  1[]  2[]       TOTAL:17       ROS  Comprehensive ROS performed and pertinent positives documented in HPI   Past History   Past Medical History:  Diagnosis Date   Acute arterial ischemic stroke, vertebrobasilar, thalamic, left (HCC) 04/25/2020   Acute CVA (cerebrovascular accident) (HCC) 03/27/2021   BPH (benign prostatic hyperplasia)    Diabetes mellitus without complication (HCC)    HLD (hyperlipidemia)    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   Hypertension    Nicotine  dependence,  cigarettes, uncomplicated 04/25/2020   Sleep apnea 01/11/2024   Stroke [facility])     Past Surgical History:  Procedure Laterality Date   ABDOMINAL AORTOGRAM W/LOWER EXTREMITY N/A 02/02/2022   Procedure: ABDOMINAL AORTOGRAM W/LOWER EXTREMITY;  Surgeon: Lanis Fonda BRAVO, MD;  Location: [facility] INVASIVE CV LAB;  Service: Cardiovascular;  Laterality: N/A;   AMPUTATION Right 02/17/2022   Procedure: AMPUTATION DIGIT GREAT AND SECOND TOES;  Surgeon: Sheree Penne Bruckner, MD;  Location: [facility] OR;  Service: Vascular;  Laterality: Right;   AMPUTATION Right 03/22/2022   Procedure: AMPUTATION ABOVE KNEE;  Surgeon: Sheree Penne Bruckner, MD;  Location: [facility] OR;  Service: Vascular;  Laterality: Right;   BYPASS GRAFT FEMORAL-PERONEAL Right 02/17/2022   Procedure: Right BYPASS GRAFT GURNEY;  Surgeon: Sheree Penne Bruckner, MD;  Location: [facility] OR;  Service: Vascular;  Laterality: Right;   ENDARTERECTOMY FEMORAL Right 02/17/2022   Procedure: ENDARTERECTOMY FEMORAL RIGHT;  Surgeon: Sheree Penne Bruckner, MD;  Location: [facility] OR;  Service: Vascular;  Laterality: Right;   INSERTION OF ILIAC STENT  02/17/2022   Procedure: INSERTION OF PERONEAL STENT;  Surgeon: Sheree Penne Bruckner, MD;  Location: [facility] OR;  Service: Vascular;;   IR CATHETER TUBE CHANGE  09/19/2022  LOWER EXTREMITY ANGIOGRAM  02/17/2022   Procedure: LOWER EXTREMITY ANGIOGRAM;  Surgeon: Sheree Penne Bruckner, MD;  Location: [facility] OR;  Service: Vascular;;    Family History: Family History  Problem Relation Age of Onset   Stroke Mother     Social History  reports that he quit smoking about 2 years ago. His smoking use included cigarettes. He has never used smokeless tobacco. He reports current alcohol use. He reports current drug use. Drug: Marijuana.  Allergies  Allergen Reactions   Pork (Diagnostic) Other (See Comments)    Triggers gout flare   Shellfish Allergy Other (See Comments)    Triggers gout flare    Medications    Current Facility-Administered Medications:    [START ON 01/13/2024]  stroke: early stages of recovery book, , Does not apply, Once, Tobie Yetta HERO, MD   acetaminophen  (TYLENOL ) tablet 650 mg, 650 mg, Oral, Q6H PRN **OR** acetaminophen  (TYLENOL ) suppository 650 mg, 650 mg, Rectal, Q6H PRN, Patel, Pranav M, MD   aspirin  tablet 325 mg, 325 mg, Oral, Daily, Tobie, Pranav M, MD   atorvastatin  (LIPITOR ) tablet 80 mg, 80 mg, Oral, QHS, Patel, Pranav M, MD   [START ON 01/13/2024] clopidogrel  (PLAVIX ) tablet 75 mg, 75 mg, Oral, Q0600, Patel, Pranav M, MD   dextrose  5 % solution, , Intravenous, Continuous, Tobie Yetta HERO, MD, Last Rate: 125 mL/hr at 01/12/24 1605, Infusion Verify at 01/12/24 1605   dutasteride  (AVODART ) capsule 0.5 mg, 0.5 mg, Oral, Daily, Patel, Pranav M, MD   heparin  injection 5,000 Units, 5,000 Units, Subcutaneous, Q8H, Patel, Pranav M, MD, 5,000 Units at 01/12/24 1500   insulin  aspart (novoLOG ) injection 0-9 Units, 0-9 Units, Subcutaneous, Q4H, Patel, Pranav M, MD, 2 Units at 01/12/24 1313   ondansetron  (ZOFRAN ) tablet 4 mg, 4 mg, Oral, Q6H PRN **OR** ondansetron  (ZOFRAN ) injection 4 mg, 4 mg, Intravenous, Q6H PRN, Tobie Yetta HERO, MD   piperacillin -tazobactam (ZOSYN ) IVPB 3.375 g, 3.375 g, Intravenous, Q8H, Tobie Yetta HERO, MD, Last Rate: 12.5 mL/hr at 01/12/24 1605, Infusion Verify at 01/12/24 1605   tamsulosin  (FLOMAX ) capsule 0.8 mg, 0.8 mg, Oral, Daily, Tobie Yetta HERO, MD  Vitals   Vitals:   01/12/24 0841 01/12/24 0842 01/12/24 1220 01/12/24 1526  BP: 107/72 107/72 109/67 (!) 145/68  Pulse: 76 81 80 90  Resp: 12 18 14 16   Temp: 97.9 F (36.6 C) 97.9 F (36.6 C) 97.6 F (36.4 C) 98.3 F (36.8 C)  TempSrc: Axillary Axillary Oral Oral  SpO2:   100% 100%  Weight:      Height:        Body mass index is 31.96 kg/m.   Physical Exam   Constitutional: Ill appearing  Psych: Affect flat Eyes: No scleral injection.  HENT: No OP obstruction.  Head: Normocephalic.   Cardiovascular: Normal rate and regular rhythm.  Respiratory: Effort normal, non-labored breathing.  GI: Soft.  No distension. There is no tenderness.  Skin: WDI.   Neurologic Examination    Neuro: Mental Status: Patient is laying in bed, eyes open. Does hold up two fingers with right hand but does not follow any other commands  Cranial Nerves: II: Blinks to threat bilaterally. Pupils are equal, round, and reactive to light.   III,IV, VI: Control and instrumentation engineer V: Facial sensation is symmetric to temperature VII: Facial movement is symmetric resting; does not smile or stick out tongue to commands  VIII: Hearing is intact to voice X: cough and gag intact XI: Head is midline  XII: Does not protrude  tongue Motor: Tone is normal. Bulk is normal.  RUE resistance against gravity but does not maintain antigravity strength  LUE resistance against gravity but does not maintain antigravity  RLE AKA - moves minimally LLE does not maintain antigravity strength Does not participate in strength testing  Sensory: Sensation is symmetric to light touch and temperature in the arms and legs. Cerebellar: No gross ataxia       Labs/Imaging/Neurodiagnostic studies   CBC:  Recent Labs  Lab 30-Jan-2024 1424 01/12/24 0308  WBC 16.9* 19.7*  NEUTROABS 13.7*  --   HGB 9.5* 8.1*  HCT 31.1* 26.8*  MCV 101.0* 102.7*  PLT 301 219   Basic Metabolic Panel:  Lab Results  Component Value Date   NA 162 (HH) 01/12/2024   K 3.9 01/12/2024   CO2 18 (L) 01/12/2024   GLUCOSE 210 (H) 01/12/2024   BUN 158 (H) 01/12/2024   CREATININE 2.64 (H) 01/12/2024   CALCIUM  9.0 01/12/2024   GFRNONAA 27 (L) 01/12/2024   GFRAA >60 03/17/2019   Lipid Panel:  Lab Results  Component Value Date   LDLCALC 48 02/18/2022   HgbA1c:  Lab Results  Component Value Date   HGBA1C 6.1 (H) 01-30-2024   Urine Drug Screen:     Component Value Date/Time   LABOPIA NONE DETECTED 12/17/2021 1600   COCAINSCRNUR NONE DETECTED  12/17/2021 1600   LABBENZ NONE DETECTED 12/17/2021 1600   AMPHETMU NONE DETECTED 12/17/2021 1600   THCU POSITIVE (A) 12/17/2021 1600   LABBARB NONE DETECTED 12/17/2021 1600    Alcohol Level     Component Value Date/Time   ETH <10 03/27/2021 1800   INR  Lab Results  Component Value Date   INR 1.0 09/19/2022   APTT  Lab Results  Component Value Date   APTT 34 03/22/2022    MRI Brain(Personally reviewed): There are multiple old lacunar infarcts including bilateral cerebellar, bilateral pontine, right midbrain, bilateral thalamic. There is an old infarct in the basal ganglia on the right with encephalomalacia. With the exception of the basal ganglia infarct, all of these have developed since the prior MRI study from 2022. There are several acute lacunar infarcts involving the left frontal lobe and left posterior temporal/parietal lobe, and a single acute lacunar infarct in the right frontal lobe.   ASSESSMENT   Ryan Hughes is a 58 y.o. male with a past medical history of left thalamic stroke in 2022, BPH, diabetes, hyperlipidemia, hypertension, right AKA who initially presented to the hospital 01/30/24 with hypernatremia, UTI, and AKI.  Initially on arrival he was unresponsive improved and then worsened again yesterday.  MRI was done that shows several acute lacunar infarcts in the left frontal and left posterior temporal/parietal lobes.  Family states that yesterday he was mainly unresponsive and then today he has been slightly more interactive.   RECOMMENDATIONS  - HgbA1c, fasting lipid panel - MRA head - Frequent neuro checks - Echocardiogram - Carotid dopplers - Prophylactic therapy- resume ASA 81mg  and plavix  75mg   - Risk factor modification - Telemetry monitoring - PT consult, OT consult, Speech consult - Stroke team to follow  ______________________________________________________________________    Signed, Jorene Last, NP [facility]  ATTENDING  ATTESTATION:  Several new strokes on MRI multiple risk factors.  Family admits that sometimes he refuses medication including his aspirin  at the nursing home.  Plan as above stroke treatment follow-up.  Dr. Nichola evaluated pt independently, reviewed imaging, chart, labs. Discussed and formulated plan with the Resident/APP. Changes were made to  the note where appropriate. Please see APP/resident note above for details.    MDM: Moderate. Pertinent labs, imaging results reviewed by me and considered in my decision making. Independently reviewed imaging. Medical records reviewed. Discussed the patient with another medical provider/personnel. Obtained history from someone other than the patient.    Antron Seth,MD

## 2024-01-13 ENCOUNTER — Other Ambulatory Visit (HOSPITAL_COMMUNITY): Payer: Self-pay

## 2024-01-13 ENCOUNTER — Inpatient Hospital Stay (HOSPITAL_COMMUNITY)

## 2024-01-13 DIAGNOSIS — E785 Hyperlipidemia, unspecified: Secondary | ICD-10-CM

## 2024-01-13 DIAGNOSIS — Z7189 Other specified counseling: Secondary | ICD-10-CM | POA: Diagnosis not present

## 2024-01-13 DIAGNOSIS — I959 Hypotension, unspecified: Secondary | ICD-10-CM

## 2024-01-13 DIAGNOSIS — Z515 Encounter for palliative care: Secondary | ICD-10-CM | POA: Diagnosis not present

## 2024-01-13 DIAGNOSIS — A419 Sepsis, unspecified organism: Secondary | ICD-10-CM

## 2024-01-13 DIAGNOSIS — N390 Urinary tract infection, site not specified: Secondary | ICD-10-CM

## 2024-01-13 DIAGNOSIS — G9341 Metabolic encephalopathy: Secondary | ICD-10-CM | POA: Diagnosis not present

## 2024-01-13 DIAGNOSIS — I6381 Other cerebral infarction due to occlusion or stenosis of small artery: Secondary | ICD-10-CM

## 2024-01-13 DIAGNOSIS — I69391 Dysphagia following cerebral infarction: Secondary | ICD-10-CM

## 2024-01-13 DIAGNOSIS — I6350 Cerebral infarction due to unspecified occlusion or stenosis of unspecified cerebral artery: Secondary | ICD-10-CM

## 2024-01-13 DIAGNOSIS — E1151 Type 2 diabetes mellitus with diabetic peripheral angiopathy without gangrene: Secondary | ICD-10-CM

## 2024-01-13 DIAGNOSIS — E860 Dehydration: Secondary | ICD-10-CM

## 2024-01-13 DIAGNOSIS — Z87891 Personal history of nicotine dependence: Secondary | ICD-10-CM

## 2024-01-13 DIAGNOSIS — R29717 NIHSS score 17: Secondary | ICD-10-CM | POA: Diagnosis not present

## 2024-01-13 LAB — GLUCOSE, CAPILLARY
Glucose-Capillary: 179 mg/dL — ABNORMAL HIGH (ref 70–99)
Glucose-Capillary: 166 mg/dL — ABNORMAL HIGH (ref 70–99)
Glucose-Capillary: 173 mg/dL — ABNORMAL HIGH (ref 70–99)
Glucose-Capillary: 162 mg/dL — ABNORMAL HIGH (ref 70–99)
Glucose-Capillary: 202 mg/dL — ABNORMAL HIGH (ref 70–99)
Glucose-Capillary: 158 mg/dL — ABNORMAL HIGH (ref 70–99)

## 2024-01-13 LAB — BASIC METABOLIC PANEL WITH GFR
Anion gap: 11 (ref 5–15)
Anion gap: 10 (ref 5–15)
Anion gap: 14 (ref 5–15)
Anion gap: 11 (ref 5–15)
BUN: 101 mg/dL — ABNORMAL HIGH (ref 6–20)
BUN: 82 mg/dL — ABNORMAL HIGH (ref 6–20)
BUN: 63 mg/dL — ABNORMAL HIGH (ref 6–20)
BUN: 56 mg/dL — ABNORMAL HIGH (ref 6–20)
CO2: 18 mmol/L — ABNORMAL LOW (ref 22–32)
CO2: 19 mmol/L — ABNORMAL LOW (ref 22–32)
CO2: 17 mmol/L — ABNORMAL LOW (ref 22–32)
CO2: 16 mmol/L — ABNORMAL LOW (ref 22–32)
Calcium: 8.5 mg/dL — ABNORMAL LOW (ref 8.9–10.3)
Calcium: 8.5 mg/dL — ABNORMAL LOW (ref 8.9–10.3)
Calcium: 8.4 mg/dL — ABNORMAL LOW (ref 8.9–10.3)
Calcium: 8.2 mg/dL — ABNORMAL LOW (ref 8.9–10.3)
Chloride: 129 mmol/L — ABNORMAL HIGH (ref 98–111)
Chloride: 124 mmol/L — ABNORMAL HIGH (ref 98–111)
Chloride: 121 mmol/L — ABNORMAL HIGH (ref 98–111)
Chloride: 123 mmol/L — ABNORMAL HIGH (ref 98–111)
Creatinine, Ser: 2.05 mg/dL — ABNORMAL HIGH (ref 0.61–1.24)
Creatinine, Ser: 1.93 mg/dL — ABNORMAL HIGH (ref 0.61–1.24)
Creatinine, Ser: 1.69 mg/dL — ABNORMAL HIGH (ref 0.61–1.24)
Creatinine, Ser: 1.56 mg/dL — ABNORMAL HIGH (ref 0.61–1.24)
GFR, Estimated: 37 mL/min — ABNORMAL LOW (ref >60)
GFR, Estimated: 39 mL/min — ABNORMAL LOW (ref >60)
GFR, Estimated: 46 mL/min — ABNORMAL LOW (ref >60)
GFR, Estimated: 51 mL/min — ABNORMAL LOW (ref >60)
Glucose, Bld: 206 mg/dL — ABNORMAL HIGH (ref 70–99)
Glucose, Bld: 205 mg/dL — ABNORMAL HIGH (ref 70–99)
Glucose, Bld: 223 mg/dL — ABNORMAL HIGH (ref 70–99)
Glucose, Bld: 208 mg/dL — ABNORMAL HIGH (ref 70–99)
Potassium: 3.7 mmol/L (ref 3.5–5.1)
Potassium: 3.7 mmol/L (ref 3.5–5.1)
Potassium: 3.6 mmol/L (ref 3.5–5.1)
Potassium: 3.9 mmol/L (ref 3.5–5.1)
Sodium: 158 mmol/L — ABNORMAL HIGH (ref 135–145)
Sodium: 153 mmol/L — ABNORMAL HIGH (ref 135–145)
Sodium: 152 mmol/L — ABNORMAL HIGH (ref 135–145)
Sodium: 150 mmol/L — ABNORMAL HIGH (ref 135–145)

## 2024-01-13 LAB — LIPID PANEL
Cholesterol: 78 mg/dL (ref 0–200)
HDL: 24 mg/dL — ABNORMAL LOW (ref >40)
LDL Cholesterol: 35 mg/dL (ref 0–99)
Total CHOL/HDL Ratio: 3.3 ratio
Triglycerides: 96 mg/dL (ref <150)
VLDL: 19 mg/dL (ref 0–40)

## 2024-01-13 LAB — RAPID URINE DRUG SCREEN, HOSP PERFORMED
Amphetamines: NOT DETECTED
Barbiturates: NOT DETECTED
Benzodiazepines: NOT DETECTED
Cocaine: NOT DETECTED
Opiates: NOT DETECTED
Tetrahydrocannabinol: NOT DETECTED

## 2024-01-13 LAB — PHOSPHORUS: Phosphorus: 2.5 mg/dL (ref 2.5–4.6)

## 2024-01-13 LAB — MAGNESIUM: Magnesium: 2.2 mg/dL (ref 1.7–2.4)

## 2024-01-13 LAB — VITAMIN B1: Vitamin B1 (Thiamine): 136.1 nmol/L (ref 66.5–200.0)

## 2024-01-13 MED ORDER — SODIUM CHLORIDE 0.9 % IV SOLN
3.0000 g | Freq: Four times a day (QID) | INTRAVENOUS | Status: AC
  Administered 2024-01-13 – 2024-01-17 (×18): 3 g via INTRAVENOUS
  Filled 2024-01-13 (×20): qty 8

## 2024-01-13 MED ORDER — TICAGRELOR 90 MG PO TABS
90.0000 mg | ORAL_TABLET | Freq: Two times a day (BID) | ORAL | Status: DC
  Administered 2024-01-14 – 2024-01-15 (×3): 90 mg via ORAL
  Filled 2024-01-13 (×3): qty 1

## 2024-01-13 MED ORDER — DEXTROSE 5 % IV SOLN: INTRAVENOUS | Status: AC

## 2024-01-13 MED ORDER — ASPIRIN 81 MG PO TBEC
81.0000 mg | DELAYED_RELEASE_TABLET | Freq: Every day | ORAL | Status: DC
  Administered 2024-01-14 – 2024-02-04 (×15): 81 mg via ORAL
  Filled 2024-01-13 (×24): qty 1

## 2024-01-13 NOTE — Progress Notes (Signed)
 [facility] Progress Note Patient: Ryan Hughes FMW:[id number] DOB: 19-Oct-1965  DOA: 01/11/2024 DOS: the patient was seen and examined on 01/13/2024  Brief Hospital Course: Patient with PMH of dementia, depression, CVA, HLD, type II DM, PVD with right AKA, wheelchair-bound, BPH with suprapubic catheter present to the hospital with complaints of altered mental status. Presented with confusion. Currently being treated for UTI, AKI, hypernatremia, and stroke  Assessment and Plan: Acute renal failure Acute hypernatremia Uremia Normal anion gap metabolic acidosis Presents with confusion. Workup shows serum creatinine of 4, sodium of 162, bicarb 19. Creatinine improving with hydration. Continue with D5 for now. Unremarkable US  renal. Suprapubic catheter appears to be functioning okay.  Sepsis secondary to UTI due to chronic indwelling catheter Chronic suprapubic catheter  BPH Patient has chronic suprapubic catheter. Unknown last change time. Suprapubic tubing was changed in the ER. Will check UA. Presented with hypotension, leukocytosis, tachypnea with potential source of infection in the urine. Chest x-ray is clear. No other evidence of infection. Urine culture growing multiple organisms. Currently on Unasyn .  Dementia without behavioral disturbance (HCC) Depression Acute metabolic encephalopathy History of CVA (cerebrovascular accident) Unwitnessed fall Acute CVA Primary reason for presentation is confusion. Suspect this is in the setting of metabolic abnormality with AKI as well as hyponatremia. Delirium in the setting of dementia in the setting of UTI cannot be ruled out as well. Metabolic workup not remarkable ammonia, TSH, B1, B12, folic acid . CT head negative for any acute stroke. MRI shows multiple acute lacunar stroke. Neurology consulted. Was on aspirin  and Plavix .  Now on aspirin  and Brilinta  per neurology recommendation. SLP PT OT following.  HTN Presents  with hypotension. Currently holding home medications. Continue with aggressive IV hydration.  Type 2 diabetes mellitus with vascular disease (HCC) On sliding scale insulin  and metformin  and Trulicity . For now continuing only sliding scale.  Anemia of chronic disease Hemoglobin stable.  On the lower side around 8. For now monitor.  PAD (peripheral artery disease) (HCC) On aspirin  Plavix . Also has history of AKA on the right. Monitor for now.  Concern for sleep apnea Patient has apneic spell while in the ED on my evaluation. Apneic spell appears to have resolved now. No prior history of sleep apnea. BiPAP as needed.  Elevated troponin Likely in the setting of poor clearance due to AKI rather than demand ischemia. No evidence of EKG evidence of ischemia.   Subjective: Minimally verbalizing.  Denies any acute complaint.  No acute events overnight.  Physical Exam: Upper airway crackles heard. S1-S2 present Oral mucosa clear. No edema. Bowel sound present.  Nontender. Alert, was unable to tell me his name, unable to follow commands, spontaneously moves all extremities.  Data Reviewed: I have Reviewed nursing notes, Vitals, and Lab results. Since last encounter, pertinent lab results CBC and BMP   . I have ordered test including CBC and BMP  . I have ordered imaging chest x-ray  .   Disposition: Status is: Inpatient Remains inpatient appropriate because: Monitor for improvement in mentation, oral intake  heparin  injection 5,000 Units Start: 01/12/24 0600   Family Communication: No one at bedside discussed with niece on the phone Level of care: Progressive   Vitals:   01/13/24 0720 01/13/24 0900 01/13/24 1055 01/13/24 1500  BP: 122/76 (!) 128/100 111/77 (!) 154/80  Pulse: 81 82 87 92  Resp: 10 13 18 17   Temp: 97.8 F (36.6 C)  97.9 F (36.6 C) 98 F (36.7 C)  TempSrc: Axillary  Axillary Oral  SpO2:    100%  Weight:      Height:         Author: Yetta Blanch,  MD 01/13/2024 6:35 PM  Please look on [email or web address] to find out who is on call.

## 2024-01-13 NOTE — Plan of Care (Signed)

## 2024-01-13 NOTE — Plan of Care (Signed)
 Problem: Education: Goal: Ability to describe self-care measures that may prevent or decrease complications (Diabetes Survival Skills Education) will improve 01/13/2024 0521 by Hershel Kirsch, RN Outcome: Not Progressing 01/13/2024 0317 by Hershel Kirsch, RN Outcome: Progressing Goal: Individualized Educational Video(s) 01/13/2024 0521 by Hershel Kirsch, RN Outcome: Not Progressing 01/13/2024 0317 by Hershel Kirsch, RN Outcome: Progressing   Problem: Coping: Goal: Ability to adjust to condition or change in health will improve 01/13/2024 0521 by Hershel Kirsch, RN Outcome: Not Progressing 01/13/2024 0317 by Hershel Kirsch, RN Outcome: Progressing   Problem: Fluid Volume: Goal: Ability to maintain a balanced intake and output will improve 01/13/2024 0521 by Hershel Kirsch, RN Outcome: Not Progressing 01/13/2024 0317 by Hershel Kirsch, RN Outcome: Progressing   Problem: Health Behavior/Discharge Planning: Goal: Ability to identify and utilize available resources and services will improve 01/13/2024 0521 by Hershel Kirsch, RN Outcome: Not Progressing 01/13/2024 0317 by Hershel Kirsch, RN Outcome: Progressing Goal: Ability to manage health-related needs will improve 01/13/2024 0521 by Hershel Kirsch, RN Outcome: Not Progressing 01/13/2024 0317 by Hershel Kirsch, RN Outcome: Progressing   Problem: Metabolic: Goal: Ability to maintain appropriate glucose levels will improve 01/13/2024 0521 by Hershel Kirsch, RN Outcome: Not Progressing 01/13/2024 0317 by Hershel Kirsch, RN Outcome: Progressing   Problem: Nutritional: Goal: Maintenance of adequate nutrition will improve 01/13/2024 0521 by Hershel Kirsch, RN Outcome: Not Progressing 01/13/2024 0317 by Hershel Kirsch, RN Outcome: Progressing Goal: Progress toward achieving an optimal weight will improve 01/13/2024 0521 by Hershel Kirsch, RN Outcome: Not Progressing 01/13/2024 0317 by Hershel Kirsch, RN Outcome: Progressing   Problem: Skin  Integrity: Goal: Risk for impaired skin integrity will decrease 01/13/2024 0521 by Hershel Kirsch, RN Outcome: Not Progressing 01/13/2024 0317 by Hershel Kirsch, RN Outcome: Progressing   Problem: Tissue Perfusion: Goal: Adequacy of tissue perfusion will improve 01/13/2024 0521 by Hershel Kirsch, RN Outcome: Not Progressing 01/13/2024 0317 by Hershel Kirsch, RN Outcome: Progressing   Problem: Education: Goal: Knowledge of General Education information will improve Description: Including pain rating scale, medication(s)/side effects and non-pharmacologic comfort measures 01/13/2024 0521 by Hershel Kirsch, RN Outcome: Not Progressing 01/13/2024 0317 by Hershel Kirsch, RN Outcome: Progressing   Problem: Health Behavior/Discharge Planning: Goal: Ability to manage health-related needs will improve 01/13/2024 0521 by Hershel Kirsch, RN Outcome: Not Progressing 01/13/2024 0317 by Hershel Kirsch, RN Outcome: Progressing   Problem: Clinical Measurements: Goal: Ability to maintain clinical measurements within normal limits will improve 01/13/2024 0521 by Hershel Kirsch, RN Outcome: Not Progressing 01/13/2024 0317 by Hershel Kirsch, RN Outcome: Progressing Goal: Will remain free from infection 01/13/2024 0521 by Hershel Kirsch, RN Outcome: Not Progressing 01/13/2024 0317 by Hershel Kirsch, RN Outcome: Progressing Goal: Diagnostic test results will improve 01/13/2024 0521 by Hershel Kirsch, RN Outcome: Not Progressing 01/13/2024 0317 by Hershel Kirsch, RN Outcome: Progressing Goal: Respiratory complications will improve 01/13/2024 0521 by Hershel Kirsch, RN Outcome: Not Progressing 01/13/2024 0317 by Hershel Kirsch, RN Outcome: Progressing Goal: Cardiovascular complication will be avoided 01/13/2024 0521 by Hershel Kirsch, RN Outcome: Not Progressing 01/13/2024 0317 by Hershel Kirsch, RN Outcome: Progressing   Problem: Activity: Goal: Risk for activity intolerance will decrease 01/13/2024 0521 by Hershel Kirsch, RN Outcome: Not Progressing 01/13/2024 0317 by Hershel Kirsch, RN Outcome: Progressing   Problem: Nutrition: Goal: Adequate nutrition will be maintained 01/13/2024 0521 by Hershel Kirsch, RN Outcome: Not Progressing 01/13/2024 0317 by Hershel Kirsch, RN Outcome: Progressing   Problem: Coping: Goal: Level of anxiety will decrease 01/13/2024 0521 by Hershel Kirsch, RN Outcome: Not Progressing 01/13/2024 [phone]  by Hershel Kirsch, RN Outcome: Progressing   Problem: Elimination: Goal: Will not experience complications related to bowel motility 01/13/2024 0521 by Hershel Kirsch, RN Outcome: Not Progressing 01/13/2024 0317 by Hershel Kirsch, RN Outcome: Progressing Goal: Will not experience complications related to urinary retention 01/13/2024 0521 by Hershel Kirsch, RN Outcome: Not Progressing 01/13/2024 0317 by Hershel Kirsch, RN Outcome: Progressing   Problem: Pain Managment: Goal: General experience of comfort will improve and/or be controlled 01/13/2024 0521 by Hershel Kirsch, RN Outcome: Not Progressing 01/13/2024 0317 by Hershel Kirsch, RN Outcome: Progressing   Problem: Safety: Goal: Ability to remain free from injury will improve 01/13/2024 0521 by Hershel Kirsch, RN Outcome: Not Progressing 01/13/2024 0317 by Hershel Kirsch, RN Outcome: Progressing   Problem: Skin Integrity: Goal: Risk for impaired skin integrity will decrease 01/13/2024 0521 by Hershel Kirsch, RN Outcome: Not Progressing 01/13/2024 0317 by Hershel Kirsch, RN Outcome: Progressing   Problem: Education: Goal: Knowledge of disease or condition will improve 01/13/2024 0521 by Hershel Kirsch, RN Outcome: Not Progressing 01/13/2024 0317 by Hershel Kirsch, RN Outcome: Progressing Goal: Knowledge of secondary prevention will improve (MUST DOCUMENT ALL) 01/13/2024 0521 by Hershel Kirsch, RN Outcome: Not Progressing 01/13/2024 0317 by Hershel Kirsch, RN Outcome: Progressing Goal: Knowledge of patient specific risk factors  will improve (DELETE if not current risk factor) 01/13/2024 0521 by Hershel Kirsch, RN Outcome: Not Progressing 01/13/2024 0317 by Hershel Kirsch, RN Outcome: Progressing   Problem: Ischemic Stroke/TIA Tissue Perfusion: Goal: Complications of ischemic stroke/TIA will be minimized 01/13/2024 0521 by Hershel Kirsch, RN Outcome: Not Progressing 01/13/2024 0317 by Hershel Kirsch, RN Outcome: Progressing   Problem: Coping: Goal: Will verbalize positive feelings about self 01/13/2024 0521 by Hershel Kirsch, RN Outcome: Not Progressing 01/13/2024 0317 by Hershel Kirsch, RN Outcome: Progressing Goal: Will identify appropriate support needs 01/13/2024 0521 by Hershel Kirsch, RN Outcome: Not Progressing 01/13/2024 0317 by Hershel Kirsch, RN Outcome: Progressing   Problem: Health Behavior/Discharge Planning: Goal: Ability to manage health-related needs will improve 01/13/2024 0521 by Hershel Kirsch, RN Outcome: Not Progressing 01/13/2024 0317 by Hershel Kirsch, RN Outcome: Progressing Goal: Goals will be collaboratively established with patient/family 01/13/2024 0521 by Hershel Kirsch, RN Outcome: Not Progressing 01/13/2024 0317 by Hershel Kirsch, RN Outcome: Progressing   Problem: Self-Care: Goal: Ability to participate in self-care as condition permits will improve 01/13/2024 0521 by Hershel Kirsch, RN Outcome: Not Progressing 01/13/2024 0317 by Hershel Kirsch, RN Outcome: Progressing Goal: Verbalization of feelings and concerns over difficulty with self-care will improve 01/13/2024 0521 by Hershel Kirsch, RN Outcome: Not Progressing 01/13/2024 0317 by Hershel Kirsch, RN Outcome: Progressing Goal: Ability to communicate needs accurately will improve 01/13/2024 0521 by Hershel Kirsch, RN Outcome: Not Progressing 01/13/2024 0317 by Hershel Kirsch, RN Outcome: Progressing   Problem: Nutrition: Goal: Risk of aspiration will decrease 01/13/2024 0521 by Hershel Kirsch, RN Outcome: Not Progressing 01/13/2024 0317  by Hershel Kirsch, RN Outcome: Progressing Goal: Dietary intake will improve 01/13/2024 0521 by Hershel Kirsch, RN Outcome: Not Progressing 01/13/2024 0317 by Hershel Kirsch, RN Outcome: Progressing

## 2024-01-13 NOTE — Progress Notes (Signed)
 VASCULAR LAB    Carotid duplex has been performed.  See CV proc for preliminary results.   Kebron Pulse, RVT 01/13/2024, 4:24 PM

## 2024-01-13 NOTE — Progress Notes (Signed)
 Pharmacy Antibiotic Note  Ryan Hughes is a 58 y.o. male admitted on 01/11/2024 with UTI.  Pharmacy has been consulted for Unasyn  dosing.  Plan: Unasyn  3g Q6H  Scr trending down from 4 to now 1.93. Continue to monitor  Height: 5' 6 (167.6 cm) Weight: 89.8 kg (198 lb) IBW/kg (Calculated) : 63.8  Temp (24hrs), Avg:98 F (36.7 C), Min:97.7 F (36.5 C), Max:98.3 F (36.8 C)  Recent Labs  Lab 01/11/24 1424 01/11/24 1453 01/11/24 1730 01/12/24 0308 01/12/24 1008 01/12/24 1645 01/12/24 2220 01/13/24 0322 01/13/24 1024  WBC 16.9*  --   --  19.7*  --  14.3*  --   --   --   CREATININE 4.00*  --    < > 2.97* 2.64* 2.48* 2.19* 2.05* 1.93*  LATICACIDVEN  --  1.4  --   --   --   --   --   --   --    < > = values in this interval not displayed.    Estimated Creatinine Clearance: 43.3 mL/min (A) (by C-G formula based on SCr of 1.93 mg/dL (H)).    Allergies  Allergen Reactions   Pork (Diagnostic) Other (See Comments)    Triggers gout flare   Shellfish Allergy Other (See Comments)    Triggers gout flare    Antimicrobials this admission: 9/4 cefepime  x1 9/4 metronidazole  x1 9/4 vancomycin  x1 9/5 linezolid  x2 9/5 zosyn  x1   Dose adjustments this admission: None  Microbiology results: 9/4 BCx: 2/4 GPC - reincubated for better growth 9/4 UCx: P. Mirabilis and E. coli    Thank you for allowing pharmacy to be a part of this patient's care.  Prentice DOROTHA Favors, PharmD PGY1 Health-System Pharmacy Administration and Leadership Resident [facility]  01/13/2024 2:41 PM

## 2024-01-13 NOTE — Evaluation (Signed)
 Speech Language Pathology Evaluation Patient Details Name: Ryan Hughes MRN: [id number] DOB: [birth year] Today's Date: 01/13/2024 Time: 1255-1340 SLP Time Calculation (min) (ACUTE ONLY): 45 min  Problem List:  Patient Active Problem List   Diagnosis Date Noted   Lacunar infarct, acute (HCC) 01/13/2024   Poor dentition 01/12/2024   Pressure injury of skin 01/12/2024   Acute renal failure (ARF) (HCC) 01/11/2024   Chronic suprapubic catheter (HCC) 01/11/2024   Acute hypernatremia 01/11/2024   Uremia 01/11/2024   Sleep apnea 01/11/2024   Normal anion gap metabolic acidosis 01/11/2024   Elevated troponin 01/11/2024   Dementia without behavioral disturbance (HCC) 01/11/2024   Depression 01/11/2024   Sepsis secondary to UTI due to chronic indwelling catheter 01/11/2024   Acute metabolic encephalopathy 01/11/2024   Unwitnessed fall 01/11/2024   Hx of AKA (above knee amputation), right 01/11/2024   Acute postoperative anemia due to expected blood loss 03/23/2022   Critical limb ischemia of right lower extremity (HCC) 03/22/2022   PAD (peripheral artery disease) (HCC) 02/17/2022   Hypomagnesemia 02/01/2022   Anemia of chronic disease 02/01/2022   Gangrene (HCC) 01/31/2022   Hypokalemia 12/16/2021   Nausea & vomiting 12/15/2021   Type 2 diabetes mellitus with vascular disease (HCC) 03/27/2021   Intractable nausea and vomiting 08/05/2020   HTN (hypertension) 08/05/2020   Mixed hyperlipidemia due to type 2 diabetes mellitus (HCC) 04/25/2020   Benign prostatic hyperplasia (BPH) with straining on urination 04/25/2020   Nicotine  dependence, cigarettes, uncomplicated 04/25/2020   History of CVA (cerebrovascular accident) 04/25/2020   Rash 03/19/2013   Urinary hesitancy 03/19/2013   Back spasm 05/16/2012   Foot pain 07/18/2011   Tingling in extremities 06/18/2011   Past Medical History:  Past Medical History:  Diagnosis Date   Acute arterial ischemic stroke, vertebrobasilar, thalamic,  left (HCC) 04/25/2020   Acute CVA (cerebrovascular accident) (HCC) 03/27/2021   BPH (benign prostatic hyperplasia)    Diabetes mellitus without complication (HCC)    HLD (hyperlipidemia)    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   Hypertension    Nicotine  dependence, cigarettes, uncomplicated 04/25/2020   Sleep apnea 01/11/2024   Stroke [facility])    Past Surgical History:  Past Surgical History:  Procedure Laterality Date   ABDOMINAL AORTOGRAM W/LOWER EXTREMITY N/A 02/02/2022   Procedure: ABDOMINAL AORTOGRAM W/LOWER EXTREMITY;  Surgeon: Lanis Fonda BRAVO, MD;  Location: [facility] INVASIVE CV LAB;  Service: Cardiovascular;  Laterality: N/A;   AMPUTATION Right 02/17/2022   Procedure: AMPUTATION DIGIT GREAT AND SECOND TOES;  Surgeon: Sheree Penne Bruckner, MD;  Location: [facility] OR;  Service: Vascular;  Laterality: Right;   AMPUTATION Right 03/22/2022   Procedure: AMPUTATION ABOVE KNEE;  Surgeon: Sheree Penne Bruckner, MD;  Location: [facility] OR;  Service: Vascular;  Laterality: Right;   BYPASS GRAFT FEMORAL-PERONEAL Right 02/17/2022   Procedure: Right BYPASS GRAFT GURNEY;  Surgeon: Sheree Penne Bruckner, MD;  Location: [facility] OR;  Service: Vascular;  Laterality: Right;   ENDARTERECTOMY FEMORAL Right 02/17/2022   Procedure: ENDARTERECTOMY FEMORAL RIGHT;  Surgeon: Sheree Penne Bruckner, MD;  Location: [facility] OR;  Service: Vascular;  Laterality: Right;   INSERTION OF ILIAC STENT  02/17/2022   Procedure: INSERTION OF PERONEAL STENT;  Surgeon: Sheree Penne Bruckner, MD;  Location: [facility] OR;  Service: Vascular;;   IR CATHETER TUBE CHANGE  09/19/2022   LOWER EXTREMITY ANGIOGRAM  02/17/2022   Procedure: LOWER EXTREMITY ANGIOGRAM;  Surgeon: Sheree Penne Bruckner, MD;  Location: [facility] OR;  Service: Vascular;;   HPI:  58 y.o. male presented from  SNF with AMS. Dx sepsis secondary to UTI with chronic suprapubic catheter, ARF, hypernatremia, uremia. Recent poor PO intake and dehydration  per notes. PMHx dementia, prior CVA, depression, HLD, PVD with right AKA. Prior MBS 12/17/21- regular diet, overall weakness of pharyngeal constriction, tongue propulsion, premature spillage. No aspiration. ST f/u for diet tolerance/potential progression and speech/language cognitive assessment.   Assessment / Plan / Recommendation Clinical Impression  Pt seen for limited speech/language cognitive assessment with chart review revealing prior CVA/dementia at baseline impacting assessment.  Pt responded verbally in word-phrases consisting of answers to personal/orientation questions with pt Ox3( with time exception).  Pt followed simple directives during assessment once rapport established.  Pt's speech was intelligible with low vocal intensity observed during word/phrases within conversation.  He stated phrases to answer questions such as Live near [employer] and to protest Don't stick me no more to nursing staff when medication provided. Pt exhibited decreased processing of information/auditory comprehension at baseline from prior CVA, but family noted decreased interaction during this hospital stay.  ST will continue to f/u for cognitive/linguistic tx and dysphagia tx during acute stay.  May consider ST f/u at next level of care for ongoing needs for dysphagia/cognitive-linguistic skills.  Thank you for this consult.    SLP Assessment  SLP Recommendation/Assessment: Patient needs continued Speech Language Pathology Services SLP Visit Diagnosis: Cognitive communication deficit (R41.841)     Assistance Recommended at Discharge  Frequent or constant Supervision/Assistance  Functional Status Assessment Patient has had a recent decline in their functional status and demonstrates the ability to make significant improvements in function in a reasonable and predictable amount of time.  Frequency and Duration min 2x/week  1 week      SLP Evaluation Cognition  Overall Cognitive Status: Impaired/Different from  baseline Arousal/Alertness: Awake/alert Orientation Level: Oriented to person;Oriented to place;Disoriented to time;Oriented to situation Attention: Sustained Sustained Attention: Impaired Sustained Attention Impairment: Verbal basic;Functional basic Memory: Impaired Memory Impairment: Decreased short term memory;Retrieval deficit;Decreased recall of new information;Other (comment) (baseline dementia per chart review) Decreased Short Term Memory: Verbal basic;Functional basic Awareness: Impaired Awareness Impairment: Anticipatory impairment Problem Solving: Impaired Problem Solving Impairment: Verbal basic;Functional basic Behaviors: Restless Safety/Judgment: Other (comment) (DTA)       Comprehension  Auditory Comprehension Overall Auditory Comprehension: Impaired at baseline Commands: Not tested Conversation: Other (comment) (responded in simple words/phrases) Interfering Components: Processing speed;Working [employer] fast streamer;Attention EffectiveTechniques: Repetition;Extra processing time;Visual/Gestural cues Visual Recognition/Discrimination Discrimination: Not tested Reading Comprehension Reading Status: Not tested    Expression Expression Primary Mode of Expression: Verbal Verbal Expression Overall Verbal Expression: Other (comment) (DTA d/t limited verbalizations) Level of Generative/Spontaneous Verbalization: Phrase Naming: Not tested Pragmatics: Unable to assess Interfering Components: Premorbid deficit Non-Verbal Means of Communication: Gestures Other Verbal Expression Comments: Family noted pt would speak in conversation with others prior to this hospitalization Written Expression Dominant Hand: Right Written Expression: Unable to assess (comment) (cognitive impairment)   Oral / Motor  Oral Motor/Sensory Function Overall Oral Motor/Sensory Function: Mild impairment Motor Speech Overall Motor Speech: Appears within functional limits for tasks assessed Respiration: Within  functional limits Phonation: Normal Resonance: Within functional limits Articulation: Within functional limitis Intelligibility: Intelligible Motor Planning: Within functional limits Motor Speech Errors: Not applicable Interfering Components: Premorbid status            Pat Yasuko Lapage,M.S.,CCC-SLP 01/13/2024, 4:01 PM

## 2024-01-13 NOTE — Plan of Care (Signed)
  Problem: Coping: Goal: Ability to adjust to condition or change in health will improve Outcome: Progressing   Problem: Fluid Volume: Goal: Ability to maintain a balanced intake and output will improve Outcome: Progressing   Problem: Metabolic: Goal: Ability to maintain appropriate glucose levels will improve Outcome: Progressing   Problem: Nutritional: Goal: Maintenance of adequate nutrition will improve Outcome: Progressing   Problem: Skin Integrity: Goal: Risk for impaired skin integrity will decrease Outcome: Progressing   Problem: Tissue Perfusion: Goal: Adequacy of tissue perfusion will improve Outcome: Progressing   Problem: Clinical Measurements: Goal: Will remain free from infection Outcome: Progressing Goal: Diagnostic test results will improve Outcome: Progressing

## 2024-01-13 NOTE — Progress Notes (Signed)
 Daily Progress Note   Patient Name: Ryan Hughes       Date: 01/13/2024 DOB: 09-Dec-1965  Age: 58 y.o. MRN#: [id number] Attending Physician: Tobie Yetta HERO, MD Primary Care Physician: [name], MD Admit Date: 01/11/2024  Reason for Consultation/Follow-up: Establishing goals of care   Length of Stay: 2  Current Medications: Scheduled Meds:    stroke: early stages of recovery book   Does not apply Once   aspirin   325 mg Oral Daily   atorvastatin   80 mg Oral QHS   clopidogrel   75 mg Oral Q0600   dutasteride   0.5 mg Oral Daily   heparin   5,000 Units Subcutaneous Q8H   insulin  aspart  0-9 Units Subcutaneous Q4H   tamsulosin   0.8 mg Oral Daily    Continuous Infusions:  dextrose  150 mL/hr at 01/13/24 0515   piperacillin -tazobactam (ZOSYN )  IV 3.375 g (01/13/24 0517)    PRN Meds: acetaminophen  **OR** acetaminophen , ondansetron  **OR** ondansetron  (ZOFRAN ) IV  Physical Exam Vitals reviewed.  Constitutional:      General: He is sleeping.     Appearance: He is ill-appearing.  Cardiovascular:     Rate and Rhythm: Normal rate.  Pulmonary:     Effort: Pulmonary effort is normal.  Skin:    General: Skin is warm and dry.             Vital Signs: BP 122/76 (BP Location: Left Arm)   Pulse 81   Temp 97.8 F (36.6 C) (Axillary)   Resp 10   Ht 5' 6 (1.676 m)   Wt 89.8 kg   SpO2 100%   BMI 31.96 kg/m  SpO2: SpO2: 100 % O2 Device: O2 Device: Room Air O2 Flow Rate:     Patient Active Problem List   Diagnosis Date Noted   Poor dentition 01/12/2024   Pressure injury of skin 01/12/2024   Acute renal failure (ARF) (HCC) 01/11/2024   Chronic suprapubic catheter (HCC) 01/11/2024   Acute hypernatremia 01/11/2024   Uremia 01/11/2024   Sleep apnea 01/11/2024   Normal anion  gap metabolic acidosis 01/11/2024   Elevated troponin 01/11/2024   Dementia without behavioral disturbance (HCC) 01/11/2024   Depression 01/11/2024   Sepsis secondary to UTI due to chronic indwelling catheter 01/11/2024   Acute metabolic encephalopathy 01/11/2024   Unwitnessed fall 01/11/2024  Hx of AKA (above knee amputation), right 01/11/2024   Acute postoperative anemia due to expected blood loss 03/23/2022   Critical limb ischemia of right lower extremity (HCC) 03/22/2022   PAD (peripheral artery disease) (HCC) 02/17/2022   Hypomagnesemia 02/01/2022   Anemia of chronic disease 02/01/2022   Gangrene (HCC) 01/31/2022   Hypokalemia 12/16/2021   Nausea & vomiting 12/15/2021   Type 2 diabetes mellitus with vascular disease (HCC) 03/27/2021   Intractable nausea and vomiting 08/05/2020   HTN (hypertension) 08/05/2020   Mixed hyperlipidemia due to type 2 diabetes mellitus (HCC) 04/25/2020   Benign prostatic hyperplasia (BPH) with straining on urination 04/25/2020   Nicotine  dependence, cigarettes, uncomplicated 04/25/2020   History of CVA (cerebrovascular accident) 04/25/2020   Rash 03/19/2013   Urinary hesitancy 03/19/2013   Back spasm 05/16/2012   Foot pain 07/18/2011   Tingling in extremities 06/18/2011    Palliative Care Assessment & Plan   Patient Profile: 58 y.o. male  with past medical history of dementia, CVA w/ L hemiplegia and aphasia, HLD, HTN, DM2, PVD with right AKA, BPH with suprapubic catheter admitted from [facility] on 01/11/2024 with increasing AMS and sweating.    Pt found with acute renal failure, hypernatremia, metabolic acidosis with normal anion gap, sepsis secondary to UTI with suprapubic catheter, acute metabolic encephalopathy. MRI (+) Acute lacunar infaracts involving L frontal, L posterior temporal/ parietal lobe and single acute lacunar infarct R frontal lobe.   Today's Discussion: Patient sleeping. He appears comfortable and in no  apparent distress. He does not easily awaken when I call his name and I let him sleep. No family at bedside.  9:10 am Called patient's niece/HCPOA [name] Bach. Reviewed yesterday's conversation. Discussed the natural disease trajectory for dementia. Discussed his history of strokes. We discussed considering the patient's quality of life when making decisions around goals of care. [name] understands. When/if the patient's health or functional status declines and she believes he has an unacceptable quality of life she will make the decision to transition to comfort focused care. At this time she would ike to continue full scope of care. Encouraged family to reconsider code status. He remains full code. [name] is interested in exploring other facilities for discharge- TOC notified.   Emotional support and therapeutic listening provided. Discussed the importance of continued conversation with family and the medical providers regarding overall plan of care and treatment options, ensuring decisions are within the context of the patient's values and GOCs. Encouraged family to contact PMT with questions or concerns.  Recommendations/Plan: Full code Full scope Family wants to explore discharge options- other facilities- notified TOC Encouraged family to continue discussions re: goals of care as patient's health or functional status changes PMT support as needed    Code Status:    Code Status Orders  (From admission, onward)           Start     Ordered   01/11/24 1657  Full code  Continuous       Question:  By:  Answer:  Consent: discussion documented in EHR   01/11/24 1659           Care plan was discussed with Dr. Tobie and Olam Ally LCSW  Time spent: 45 min  Thank you for allowing the Palliative Medicine Team to assist in the care of this patient.   Stephane CHRISTELLA Palin, NP  Please contact Palliative Medicine Team phone at [phone] for questions and concerns.

## 2024-01-13 NOTE — Progress Notes (Addendum)
 STROKE TEAM PROGRESS NOTE   INTERIM HISTORY/SUBJECTIVE No family at the bedside. RN is at the bedside. Patient is awake and alert, confused not able to tell a coherent history  MRI bran with several acute lacunar infarcts involving the left frontal lobe and left posterior temporal/parietal lobe, and a single acute lacunar infarct in the right frontal lobe.   CBC    Component Value Date/Time   WBC 14.3 (H) 01/12/2024 1645   RBC 2.55 (L) 01/12/2024 1645   HGB 8.0 (L) 01/12/2024 1645   HGB 15.3 12/08/2021 1102   HCT 25.8 (L) 01/12/2024 1645   HCT 46.8 12/08/2021 1102   PLT 229 01/12/2024 1645   PLT 307 12/08/2021 1102   MCV 101.2 (H) 01/12/2024 1645   MCV 91 12/08/2021 1102   MCH 31.4 01/12/2024 1645   MCHC 31.0 01/12/2024 1645   RDW 15.2 01/12/2024 1645   RDW 13.4 12/08/2021 1102   LYMPHSABS 2.3 01/11/2024 1424   LYMPHSABS 1.9 12/08/2021 1102   MONOABS 0.7 01/11/2024 1424   EOSABS 0.1 01/11/2024 1424   EOSABS 0.0 12/08/2021 1102   BASOSABS 0.0 01/11/2024 1424   BASOSABS 0.0 12/08/2021 1102    BMET    Component Value Date/Time   NA 153 (H) 01/13/2024 1024   NA 138 12/08/2021 1102   K 3.7 01/13/2024 1024   CL 124 (H) 01/13/2024 1024   CO2 19 (L) 01/13/2024 1024   GLUCOSE 205 (H) 01/13/2024 1024   BUN 82 (H) 01/13/2024 1024   BUN 14 12/08/2021 1102   CREATININE 1.93 (H) 01/13/2024 1024   CALCIUM  8.5 (L) 01/13/2024 1024   EGFR 89 12/08/2021 1102   GFRNONAA 39 (L) 01/13/2024 1024    IMAGING past 24 hours MR ANGIO HEAD WO CONTRAST Result Date: 01/12/2024 CLINICAL DATA:  Stroke, follow up EXAM: MRA HEAD WITHOUT CONTRAST TECHNIQUE: Angiographic images of the Circle of Willis were acquired using MRA technique without intravenous contrast. COMPARISON:  Same day MRI head.  A CTA head March 27, 2021. FINDINGS: Anterior circulation: Bilateral intracranial ICAs, MCAs and ACAs are patent without proximal hemodynamically significant stenosis. Posterior circulation: Bilateral  intradural vertebral arteries, basilar artery and bilateral posterior arch are patent without proximal hemodynamically significant stenosis. IMPRESSION: No large vessel occlusion or proximal hemodynamically significant stenosis. Electronically Signed   By: Gilmore GORMAN Molt M.D.   On: 01/12/2024 22:20   ECHOCARDIOGRAM COMPLETE Result Date: 01/12/2024    ECHOCARDIOGRAM REPORT   Patient Name:   Ryan Hughes Date of Exam: 01/12/2024 Medical Rec #:  [id number]     Height:       66.0 in Accession #:    [id number]    Weight:       198.0 lb Date of Birth:  10-23-1965     BSA:          1.991 m Patient Age:    58 years      BP:           145/68 mmHg Patient Gender: M             HR:           95 bpm. Exam Location:  Inpatient Procedure: 2D Echo (Both Spectral and Color Flow Doppler were utilized during            procedure). Indications:    stroke  History:        Patient has prior history of Echocardiogram examinations, most  recent 03/28/2021. PAD; Risk Factors:Hypertension, Dyslipidemia,                 Diabetes and Sleep Apnea.  Sonographer:    Tinnie Barefoot RDCS Referring Phys: [id number] [facility] M PATEL  Sonographer Comments: Technically difficult study due to poor echo windows. Image acquisition challenging due to uncooperative patient and Image acquisition challenging due to respiratory motion. IMPRESSIONS  1. Technically difficult study, see sonographer comments.  2. Left ventricular ejection fraction, by estimation, is >75%. The left ventricle has hyperdynamic function. The left ventricle has no regional wall motion abnormalities. Left ventricular diastolic parameters were normal.  3. Right ventricular systolic function is normal. The right ventricular size is normal. Mildly increased right ventricular wall thickness. Tricuspid regurgitation signal is inadequate for assessing PA pressure.  4. The mitral valve is grossly normal. No evidence of mitral valve regurgitation. No evidence of mitral  stenosis.  5. The aortic valve was not well visualized. Aortic valve regurgitation is not visualized. No aortic stenosis is present.  6. The inferior vena cava is normal in size with greater than 50% respiratory variability, suggesting right atrial pressure of 3 mmHg. Conclusion(s)/Recommendation(s): No intracardiac source of embolism detected on this transthoracic study. Consider a transesophageal echocardiogram to exclude cardiac source of embolism if clinically indicated. FINDINGS  Left Ventricle: Left ventricular ejection fraction, by estimation, is >75%. The left ventricle has hyperdynamic function. The left ventricle has no regional wall motion abnormalities. The left ventricular internal cavity size was normal in size. There is no left ventricular hypertrophy. Left ventricular diastolic parameters were normal. Right Ventricle: The right ventricular size is normal. Mildly increased right ventricular wall thickness. Right ventricular systolic function is normal. Tricuspid regurgitation signal is inadequate for assessing PA pressure. Left Atrium: Left atrial size was normal in size. Right Atrium: Right atrial size was normal in size. Pericardium: There is no evidence of pericardial effusion. Mitral Valve: The mitral valve is grossly normal. No evidence of mitral valve regurgitation. No evidence of mitral valve stenosis. Tricuspid Valve: The tricuspid valve is not well visualized. Tricuspid valve regurgitation is not demonstrated. No evidence of tricuspid stenosis. Aortic Valve: The aortic valve was not well visualized. Aortic valve regurgitation is not visualized. No aortic stenosis is present. Pulmonic Valve: The pulmonic valve was not well visualized. Pulmonic valve regurgitation is not visualized. Aorta: The ascending aorta was not well visualized and the aortic root was not well visualized. Venous: The inferior vena cava is normal in size with greater than 50% respiratory variability, suggesting right atrial  pressure of 3 mmHg. IAS/Shunts: The atrial septum is grossly normal.  LEFT VENTRICLE PLAX 2D LVIDd:         4.25 cm Diastology LVIDs:         2.00 cm LV e' medial:    6.53 cm/s LV PW:         1.10 cm LV E/e' medial:  7.3 LV IVS:        1.00 cm LV e' lateral:   9.25 cm/s                        LV E/e' lateral: 5.1  RIGHT VENTRICLE             IVC RV Basal diam:  3.00 cm     IVC diam: 1.40 cm RV S prime:     15.60 cm/s TAPSE (M-mode): 2.6 cm LEFT ATRIUM  Index        RIGHT ATRIUM           Index LA diam:      3.50 cm 1.76 cm/m   RA Area:     17.10 cm LA Vol (A4C): 32.1 ml 16.12 ml/m  RA Volume:   45.00 ml  22.60 ml/m  AORTIC VALVE LVOT Vmax:   93.80 cm/s LVOT Vmean:  60.600 cm/s LVOT VTI:    0.161 m MV E velocity: 47.60 cm/s MV A velocity: 74.60 cm/s  SHUNTS MV E/A ratio:  0.64        Systemic VTI: 0.16 m Sunit Tolia Electronically signed by Madonna Large Signature Date/Time: 01/12/2024/5:58:19 PM    Final     Vitals:   01/13/24 0258 01/13/24 0720 01/13/24 0900 01/13/24 1055  BP: 123/68 122/76 (!) 128/100 111/77  Pulse: 80 81 82 87  Resp: 20 10 13 18   Temp: 98 F (36.7 C) 97.8 F (36.6 C)  97.9 F (36.6 C)  TempSrc: Axillary Axillary  Axillary  SpO2: 100%     Weight:      Height:         PHYSICAL EXAM General:  Alert, well-nourished, well-developed patient in no acute distress Psych:  Mood and affect appropriate for situation CV: Regular rate and rhythm on monitor Respiratory:  Regular, unlabored respirations on room air GI: Abdomen soft and nontender   NEURO:  Mental Status: awake alert and oriented to self. And [place]  Stated age as 67, month as  November he is not able to give coherent history. Not able to name objects can follow simple commands  Speech/Language: speech is without dysarthria, mild aphasia   Cranial Nerves:  II: PERRL. Visual fields full.  III, IV, VI: EOMI. Eyelids elevate symmetrically.  V: Sensation is intact to light touch and symmetrical to  face.  VII: Face is symmetrical resting and smiling VIII: hearing intact to voice. IX, X: Palate elevates symmetrically. Phonation is normal.  KP:Dynloizm shrug 5/5. XII: tongue is midline without fasciculations. Motor: generalized weakness in all 3 extremities, bilateral uppers drift equally and symmetric, right AKA, left leg can wiggle toes and move leg horizontally  Tone: is normal and bulk is normal Sensation- Intact to light touch bilaterally. Extinction absent to light touch to DSS.   Coordination: FTN intact bilaterally, Gait- deferred  ASSESSMENT/PLAN  [name] is a 58 y.o. male with history of  left thalamic stroke, dementia,  BPH, PVD, diabetes, hyperlipidemia, hypertension, OSA, right AKA who initially presented to the hospital 9/4 with hypernatremia, UTI, and AKI.  Initially on arrival he was unresponsive improved and then worsened again yesterday.  MRI was done that shows several acute lacunar infarcts in the left frontal and left posterior temporal/parietal lobes.  NIH on Admission 17  Acute Ischemic Infarct:  bilateral, left frontal posterior temporal/parietal lobe, and acute lacunar infarct right frontal lobe Etiology:  small vessel disease in the setting of infection, dehydration and hypotension  CT head No acute abnormality. Small vessel disease. Atrophy.   Remote lacunar infarcts in the bilateral basal ganglia MRI  several acute lacunar infarcts involving the left frontal lobe and left posterior temporal/parietal lobe, and a single acute lacunar infarct in the right frontal lobe. MRA  NO LVO  Carotid Doppler  ordered  2D Echo EF >75%.  LDL 35 HgbA1c 6.1 VTE prophylaxis - Hep SQ  aspirin  325 mg daily and clopidogrel  75 mg daily prior to admission, now on aspirin  81  mg daily and Brilinta  (ticagrelor ) 90 mg bid for 4 weeks and then aspirin  alone. Therapy recommendations:  Pending Disposition:  Pending   Hx of Stroke/TIA Dementia  Depression  Multiple old  strokes   Hypertension Home meds: Amlodipine  5 mg, carvedilol  12.5 mg, Lasix 20 mg, losartan  50 mg Stable Blood Pressure Goal: SBP less than 160   Hyperlipidemia Home meds: Atorvastatin  80 mg,  resumed in hospital LDL 35, goal < 70 Continue statin at discharge  Diabetes type II Controlled Home meds: Trulicity , insulin , metformin  HgbA1c 6.1, goal < 7.0 CBGs SSI Recommend close follow-up with PCP for better DM control  Former Tobacco Abuse Noted   Substance Abuse Hx of marijuana use  UDS ordered   Dysphagia Patient has post-stroke dysphagia, SLP consulted    Diet   DIET - DYS 1 Room service appropriate? No; Fluid consistency: Nectar Thick   Advance diet as tolerated  Other Stroke Risk Factors Obesity, Body mass index is 31.96 kg/m., BMI >/= 30 associated with increased stroke risk, recommend weight loss, diet and exercise as appropriate  Obstructive sleep apnea, on CPAP at home  Other Active Problems BPH  Sepsis due to UTI due to chronic catheter  Anemia  AKI  Hypernatremia   Hospital day # 2   Karna Geralds DNP, ACNPC-AG  [facility]  I have personally obtained history,examined this patient, reviewed notes, independently viewed imaging studies, participated in medical decision making and plan of care.ROS completed by me personally and pertinent positives fully documented  I have made any additions or clarifications directly to the above note. Agree with note above.  Patient is neurologically improving.  She presented with altered mental status and dysarthria in the setting of sepsis with hypotension and likely dehydration with MRI scan showing bilateral subcortical lacunar infarcts likely from small vessel disease.  Recommend aspirin  and Brilinta  for 4 weeks followed by aspirin  alone and aggressive risk factor modification.  No family at the bedside.  Discussed with Dr. Tobie.   I personally spent a total of 50 minutes in the care of the patient today  including getting/reviewing separately obtained history, performing a medically appropriate exam/evaluation, counseling and educating, placing orders, referring and communicating with other health care professionals, documenting clinical information in the EHR, independently interpreting results, and coordinating care.        Eather Popp, MD Medical Director [facility] Pager: [phone] 01/13/2024 2:05 PM   To contact Stroke Continuity provider, please refer to [email or web address]. After hours, contact General Neurology

## 2024-01-13 NOTE — Progress Notes (Signed)
 Speech Language Pathology Treatment: Dysphagia  Patient Details Name: Ryan Hughes MRN: [id number] DOB: [birth year] Today's Date: 01/13/2024 Time: 8744-8659 SLP Time Calculation (min) (ACUTE ONLY): 45 min  Assessment / Plan / Recommendation Clinical Impression  Pt seen for diet progression/dysphagia tx with family present (nieces) for portion of session with education provided re: dysphagia symptoms/BSE results from prior date initiating current diet of Dysphagia 1(puree)/nectar-thick liquids.  Pt min alert when SLP arrived and after CNA/SLP repositioned pt/provided hygiene care mid-session, pt became more alert/cooperative for tx session.  Pt required min-mod verbal cues from SLP to consume small bites/sips, self feed, volume control nectar-thick liquids and provide upgraded consistencies with safety precautions in place.  Pt exhibited a wet vocal quality/delayed cough with sips of thin, although pt did consume larger amount initially with straw.  Sips provided with SLP guiding volume eliminated delayed cough, but wet vocal quality remained.  Nursing stated pt coughs intermittently with/without po consumption and d/t positioning with head tilted to R, pt did exhibit anterior loss of secretions and subsequently coughed when head positioning in a more optimal position d/t pooling of saliva.  Pt consumed min amount of Dysphagia 1(puree) consistency with head shake to decline further bites d/t decreased satiety.  Soft consistency attempted with impaired mastication and slow prolonged bolus posterior transport observed.  Pt exhibited loose anterior dentition which intermittently impacts mastication efforts.  Recommend progressing diet to Dysphagia 2(minced)/thin liquids with swallow precautions in place and posted in room.  ST will f/u for dysphagia tx in acute setting.  HPI HPI: 58 y.o. male presented from SNF with AMS. Dx sepsis secondary to UTI with chronic suprapubic catheter, ARF, hypernatremia, uremia.  Recent poor PO intake and dehydration per notes. PMHx dementia, prior CVA, depression, HLD, PVD with right AKA. Prior MBS 12/17/21- regular diet, overall weakness of pharyngeal constriction, tongue propulsion, premature spillage. No aspiration. ST f/u for diet tolerance/potential progression.      SLP Plan  Continue with current plan of care;Goals updated          Recommendations  Diet recommendations: Dysphagia 2 (fine chop);Nectar-thick liquid Liquids provided via: Cup;Straw Medication Administration: Whole meds with puree Supervision: Staff to assist with self feeding;Full supervision/cueing for compensatory strategies Compensations: Slow rate;Small sips/bites;Minimize environmental distractions;Monitor for anterior loss;Follow solids with liquid Postural Changes and/or Swallow Maneuvers: Seated upright 90 degrees                  Oral care BID   Frequent or constant Supervision/Assistance Dysphagia, oropharyngeal phase (R13.12)     Continue with current plan of care;Goals updated     Ryan Hughes,M.S.,CCC-SLP  01/13/2024, 3:36 PM

## 2024-01-14 DIAGNOSIS — Z515 Encounter for palliative care: Secondary | ICD-10-CM | POA: Diagnosis not present

## 2024-01-14 DIAGNOSIS — I6381 Other cerebral infarction due to occlusion or stenosis of small artery: Secondary | ICD-10-CM | POA: Diagnosis not present

## 2024-01-14 DIAGNOSIS — A419 Sepsis, unspecified organism: Secondary | ICD-10-CM | POA: Diagnosis not present

## 2024-01-14 DIAGNOSIS — I6350 Cerebral infarction due to unspecified occlusion or stenosis of unspecified cerebral artery: Secondary | ICD-10-CM | POA: Diagnosis not present

## 2024-01-14 DIAGNOSIS — N390 Urinary tract infection, site not specified: Secondary | ICD-10-CM | POA: Diagnosis not present

## 2024-01-14 DIAGNOSIS — G9341 Metabolic encephalopathy: Secondary | ICD-10-CM | POA: Diagnosis not present

## 2024-01-14 DIAGNOSIS — Z7189 Other specified counseling: Secondary | ICD-10-CM | POA: Diagnosis not present

## 2024-01-14 LAB — CBC
HCT: 25 % — ABNORMAL LOW (ref 39.0–52.0)
Hemoglobin: 8 g/dL — ABNORMAL LOW (ref 13.0–17.0)
MCH: 30.9 pg (ref 26.0–34.0)
MCHC: 32 g/dL (ref 30.0–36.0)
MCV: 96.5 fL (ref 80.0–100.0)
Platelets: 263 K/uL (ref 150–400)
RBC: 2.59 MIL/uL — ABNORMAL LOW (ref 4.22–5.81)
RDW: 14.7 % (ref 11.5–15.5)
WBC: 14.3 K/uL — ABNORMAL HIGH (ref 4.0–10.5)
nRBC: 0 % (ref 0.0–0.2)

## 2024-01-14 LAB — BASIC METABOLIC PANEL WITH GFR
Anion gap: 11 (ref 5–15)
Anion gap: 10 (ref 5–15)
Anion gap: 10 (ref 5–15)
BUN: 42 mg/dL — ABNORMAL HIGH (ref 6–20)
BUN: 37 mg/dL — ABNORMAL HIGH (ref 6–20)
BUN: 32 mg/dL — ABNORMAL HIGH (ref 6–20)
CO2: 21 mmol/L — ABNORMAL LOW (ref 22–32)
CO2: 21 mmol/L — ABNORMAL LOW (ref 22–32)
CO2: 21 mmol/L — ABNORMAL LOW (ref 22–32)
Calcium: 8.2 mg/dL — ABNORMAL LOW (ref 8.9–10.3)
Calcium: 7.9 mg/dL — ABNORMAL LOW (ref 8.9–10.3)
Calcium: 8 mg/dL — ABNORMAL LOW (ref 8.9–10.3)
Chloride: 119 mmol/L — ABNORMAL HIGH (ref 98–111)
Chloride: 118 mmol/L — ABNORMAL HIGH (ref 98–111)
Chloride: 117 mmol/L — ABNORMAL HIGH (ref 98–111)
Creatinine, Ser: 1.33 mg/dL — ABNORMAL HIGH (ref 0.61–1.24)
Creatinine, Ser: 1.3 mg/dL — ABNORMAL HIGH (ref 0.61–1.24)
Creatinine, Ser: 1.16 mg/dL (ref 0.61–1.24)
GFR, Estimated: >60 mL/min (ref >60)
GFR, Estimated: >60 mL/min (ref >60)
GFR, Estimated: >60 mL/min (ref >60)
Glucose, Bld: 193 mg/dL — ABNORMAL HIGH (ref 70–99)
Glucose, Bld: 203 mg/dL — ABNORMAL HIGH (ref 70–99)
Glucose, Bld: 163 mg/dL — ABNORMAL HIGH (ref 70–99)
Potassium: 3.3 mmol/L — ABNORMAL LOW (ref 3.5–5.1)
Potassium: 3.2 mmol/L — ABNORMAL LOW (ref 3.5–5.1)
Potassium: 3.7 mmol/L (ref 3.5–5.1)
Sodium: 151 mmol/L — ABNORMAL HIGH (ref 135–145)
Sodium: 149 mmol/L — ABNORMAL HIGH (ref 135–145)
Sodium: 148 mmol/L — ABNORMAL HIGH (ref 135–145)

## 2024-01-14 LAB — GLUCOSE, CAPILLARY
Glucose-Capillary: 171 mg/dL — ABNORMAL HIGH (ref 70–99)
Glucose-Capillary: 177 mg/dL — ABNORMAL HIGH (ref 70–99)
Glucose-Capillary: 156 mg/dL — ABNORMAL HIGH (ref 70–99)
Glucose-Capillary: 173 mg/dL — ABNORMAL HIGH (ref 70–99)
Glucose-Capillary: 168 mg/dL — ABNORMAL HIGH (ref 70–99)
Glucose-Capillary: 199 mg/dL — ABNORMAL HIGH (ref 70–99)

## 2024-01-14 LAB — MAGNESIUM: Magnesium: 2 mg/dL (ref 1.7–2.4)

## 2024-01-14 MED ORDER — POTASSIUM CHLORIDE 10 MEQ/100ML IV SOLN
10.0000 meq | INTRAVENOUS | Status: AC
  Administered 2024-01-14 (×4): 10 meq via INTRAVENOUS
  Filled 2024-01-14 (×4): qty 100

## 2024-01-14 MED ORDER — CARVEDILOL 12.5 MG PO TABS
12.5000 mg | ORAL_TABLET | Freq: Two times a day (BID) | ORAL | Status: DC
  Administered 2024-01-14 – 2024-02-04 (×27): 12.5 mg via ORAL
  Filled 2024-01-14 (×41): qty 1

## 2024-01-14 MED ORDER — DEXTROSE 5 % IV SOLN: INTRAVENOUS | Status: DC

## 2024-01-14 NOTE — Progress Notes (Signed)
 [facility] Progress Note Patient: Ryan Hughes FMW:[id number] DOB: 27-Oct-1965  DOA: 01/11/2024 DOS: the patient was seen and examined on 01/14/2024  Brief Hospital Course: Patient with PMH of dementia, depression, CVA, HLD, type II DM, PVD with right AKA, wheelchair-bound, BPH with suprapubic catheter present to the hospital with complaints of altered mental status. Presented with confusion. Currently being treated for UTI, AKI, hypernatremia, and stroke.   Assessment and Plan: Acute renal failure Acute hypernatremia Uremia Normal anion gap metabolic acidosis Presents with confusion. Workup shows serum creatinine of 4, sodium of 162, bicarb 19. Creatinine improving with hydration. Continue with D5 for now. Unremarkable US  renal. Suprapubic catheter appears to be functioning okay..   Sepsis secondary to UTI due to chronic indwelling catheter Chronic suprapubic catheter  BPH Patient has chronic suprapubic catheter. Unknown last change time. Suprapubic tubing was changed in the ER. Presented with hypotension, leukocytosis, tachypnea with potential source of infection in the urine. Chest x-ray is clear. No other evidence of infection. Urine culture growing multiple organisms. Currently on Unasyn .  Dementia without behavioral disturbance (HCC) Depression Acute metabolic encephalopathy History of CVA (cerebrovascular accident) Unwitnessed fall Acute CVA Primary reason for presentation is confusion. Suspect this is in the setting of metabolic abnormality with AKI as well as hyponatremia. Delirium in the setting of dementia in the setting of UTI cannot be ruled out as well. Metabolic workup not remarkable ammonia, TSH, B1, B12, folic acid . CT head negative for any acute stroke. MRI shows multiple acute lacunar stroke. Neurology consulted. Was on aspirin  and Plavix .  Now on aspirin  and Brilinta  per neurology recommendation. SLP PT OT following.  HTN Presents with  hypotension.  Now blood pressure elevated. Initial blood pressure medications were on hold but now resuming Coreg .   Type 2 diabetes mellitus with vascular disease (HCC) On sliding scale insulin  and metformin  and Trulicity . For now continuing only sliding scale.  Anemia of chronic disease Hemoglobin stable.  On the lower side around 8. For now monitor.  PAD Was on aspirin  Plavix .  Now on aspirin  and Brilinta . Also has history of AKA on the right. Monitor for now.  Concern for sleep apnea Patient has apneic spell while in the ED on my evaluation. Apneic spell appears to have resolved now. No prior history of sleep apnea. BiPAP as needed.  Elevated troponin Likely in the setting of poor clearance due to AKI rather than demand ischemia. No evidence of EKG evidence of ischemia.   Subjective: Unchanged mentation.  No nausea no vomiting.  Remains with minimal oral intake.  Physical Exam: Upper airway crackles, bases clear to auscultation. S1-S2 present. Bowel sound present. No edema on left leg. Mains nonverbal.  Unable to follow commands.  Data Reviewed: I have Reviewed nursing notes, Vitals, and Lab results. Since last encounter, pertinent lab results CBC and BMP   . I have ordered test including CBC BMP  .   Disposition: Status is: Inpatient Remains inpatient appropriate because: Monitor for improvement in renal function and oral intake  heparin  injection 5,000 Units Start: 01/12/24 0600 Family Communication: No one at bedside Level of care: Progressive   Vitals:   01/14/24 0311 01/14/24 0937 01/14/24 1308 01/14/24 1609  BP: (!) 164/119 (!) 156/96 (!) 151/74 (!) 188/87  Pulse: (!) 102 98 98 (!) 107  Resp: 19 17 14  (!) 33  Temp: 99.3 F (37.4 C) 99.1 F (37.3 C) 99.3 F (37.4 C) 99.2 F (37.3 C)  TempSrc: Oral Oral Oral Oral  SpO2: 97% 97%  97% 95%  Weight:      Height:         Author: Yetta Blanch, MD 01/14/2024 5:43 PM  Please look on [email or web address] to  find out who is on call.

## 2024-01-14 NOTE — Progress Notes (Signed)
 Patient with projectile vomit (brown bile)after eating a few bites of dinner and getting cleaned up. Emesis all over bed, floor and NT. Gowns used when cleaning patient up again and keeping patient HOB above 25-30 degrees.

## 2024-01-14 NOTE — Progress Notes (Addendum)
 STROKE TEAM PROGRESS NOTE   INTERIM HISTORY/SUBJECTIVE No family at the bedside.  RN is at the bedside. No new neurological events overnight.  This morning he is not being real cooperative with exam, he is not speaking to us  but will intermittently follow commands.  He is able to move all 3 extremities against gravity purposefully.  He has right below-knee amputation   CBC    Component Value Date/Time   WBC 14.3 (H) 01/14/2024 0545   RBC 2.59 (L) 01/14/2024 0545   HGB 8.0 (L) 01/14/2024 0545   HGB 15.3 12/08/2021 1102   HCT 25.0 (L) 01/14/2024 0545   HCT 46.8 12/08/2021 1102   PLT 263 01/14/2024 0545   PLT 307 12/08/2021 1102   MCV 96.5 01/14/2024 0545   MCV 91 12/08/2021 1102   MCH 30.9 01/14/2024 0545   MCHC 32.0 01/14/2024 0545   RDW 14.7 01/14/2024 0545   RDW 13.4 12/08/2021 1102   LYMPHSABS 2.3 01/11/2024 1424   LYMPHSABS 1.9 12/08/2021 1102   MONOABS 0.7 01/11/2024 1424   EOSABS 0.1 01/11/2024 1424   EOSABS 0.0 12/08/2021 1102   BASOSABS 0.0 01/11/2024 1424   BASOSABS 0.0 12/08/2021 1102    BMET    Component Value Date/Time   NA 149 (H) 01/14/2024 0955   NA 138 12/08/2021 1102   K 3.2 (L) 01/14/2024 0955   CL 118 (H) 01/14/2024 0955   CO2 21 (L) 01/14/2024 0955   GLUCOSE 203 (H) 01/14/2024 0955   BUN 37 (H) 01/14/2024 0955   BUN 14 12/08/2021 1102   CREATININE 1.30 (H) 01/14/2024 0955   CALCIUM  7.9 (L) 01/14/2024 0955   EGFR 89 12/08/2021 1102   GFRNONAA >60 01/14/2024 0955    IMAGING past 24 hours VAS US  CAROTID (at [facility] and [facility] only) Result Date: 01/13/2024 Carotid Arterial Duplex Study Patient Name:  Ryan Hughes  Date of Exam:   01/13/2024 Medical Rec #: [id number]      Accession #:    [id number] Date of Birth: 1966-03-02      Patient Gender: M Patient Age:   58 years Exam Location:  [facility] Procedure:      VAS US  CAROTID Referring Phys: [facility] PATEL --------------------------------------------------------------------------------  Indications:        Acute Ischemic Infarct: bilateral, left frontal posterior                    temporal/parietal lobe, and acute lacunar infarct right                    frontal lobe. Risk Factors:      Hyperlipidemia, Diabetes, prior CVA (12/21 and 11/22), PAD. Other Factors:     Right AKA, dementia. Limitations        Today's exam was limited due to constant movement and                    coughing. Comparison Study:  No prior study on file. CTA of neck done 03/27/21 indicated                    approximate 50% right ICA stenosis (mostly soft plaque) and                    no left ICA stenosis. Performing Technologist: Alberta Lis RVS  Examination Guidelines: A complete evaluation includes B-mode imaging, spectral Doppler, color Doppler, and power Doppler as needed of all accessible portions of each vessel. Bilateral  testing is considered an integral part of a complete examination. Limited examinations for reoccurring indications may be performed as noted.  Right Carotid Findings: +----------+--------+--------+--------+------------------+--------+           PSV cm/sEDV cm/sStenosisPlaque DescriptionComments +----------+--------+--------+--------+------------------+--------+ CCA Prox  172     32              homogeneous                +----------+--------+--------+--------+------------------+--------+ CCA Distal165     22              heterogenous               +----------+--------+--------+--------+------------------+--------+ ICA Prox  103     30      1-39%   heterogenous               +----------+--------+--------+--------+------------------+--------+ ICA Mid   124     34                                         +----------+--------+--------+--------+------------------+--------+ ICA Distal66      18                                         +----------+--------+--------+--------+------------------+--------+ ECA       156     20                                          +----------+--------+--------+--------+------------------+--------+ +----------+--------+-------+---------+-------------------+           PSV cm/sEDV cmsDescribe Arm Pressure (mmHG) +----------+--------+-------+---------+-------------------+ Subclavian130            Turbulent                    +----------+--------+-------+---------+-------------------+ +---------+--------+--+--------+--+ VertebralPSV cm/s80EDV cm/s18 +---------+--------+--+--------+--+  Left Carotid Findings: +----------+--------+--------+--------+------------------+--------+           PSV cm/sEDV cm/sStenosisPlaque DescriptionComments +----------+--------+--------+--------+------------------+--------+ CCA Prox  113     21              heterogenous               +----------+--------+--------+--------+------------------+--------+ CCA Distal109     16              heterogenous               +----------+--------+--------+--------+------------------+--------+ ICA Prox  111     31      1-39%   heterogenous               +----------+--------+--------+--------+------------------+--------+ ICA Mid   97      30              homogeneous                +----------+--------+--------+--------+------------------+--------+ ICA Distal140     32                                         +----------+--------+--------+--------+------------------+--------+ ECA       223     13      >50%                               +----------+--------+--------+--------+------------------+--------+ +----------+--------+--------+--------+-------------------+  PSV cm/sEDV cm/sDescribeArm Pressure (mmHG) +----------+--------+--------+--------+-------------------+ Subclavian279             Stenotic                    +----------+--------+--------+--------+-------------------+ +---------+--------+--+--------+--+ VertebralPSV cm/s51EDV cm/s13 +---------+--------+--+--------+--+   Summary: Right  Carotid: Velocities in the right ICA are consistent with a 1-39% stenosis. Left Carotid: Velocities in the left ICA are consistent with a 1-39% stenosis.               The ECA appears >50% stenosed. Vertebrals:  Bilateral vertebral arteries demonstrate antegrade flow. Subclavians: Left subclavian artery was stenotic. Right subclavian artery flow              was disturbed. *See table(s) above for measurements and observations.     Preliminary    DG CHEST PORT 1 VIEW Result Date: 01/13/2024 CLINICAL DATA:  Altered mental status, shortness of breath EXAM: PORTABLE CHEST 1 VIEW COMPARISON:  Chest radiograph dated 01/11/2024 FINDINGS: Normal lung volumes. No focal consolidations. No pleural effusion or pneumothorax. The heart size and mediastinal contours are within normal limits. No acute osseous abnormality. IMPRESSION: No active disease. Electronically Signed   By: Limin  Xu M.D.   On: 01/13/2024 15:05    Vitals:   01/13/24 1925 01/13/24 2318 01/14/24 0311 01/14/24 0937  BP: (!) 144/61 (!) 142/68 (!) 164/119 (!) 156/96  Pulse: (!) 101 (!) 102 (!) 102 98  Resp: 17 17 19 17   Temp: 99.1 F (37.3 C) 98.4 F (36.9 C) 99.3 F (37.4 C) 99.1 F (37.3 C)  TempSrc: Oral Oral Oral Oral  SpO2: 100% 99% 97% 97%  Weight:      Height:         PHYSICAL EXAM General:  Alert, well-nourished, well-developed patient in no acute distress Psych:  Mood and affect appropriate for situation CV: Regular rate and rhythm on monitor Respiratory:  Regular, unlabored respirations on room air GI: Abdomen soft and nontender   NEURO:  Mental Status: awake alert, not speaking to us  this morning but will follow some simple commands  Speech/Language: Unable to assess  Cranial Nerves:  II: PERRL. Visual fields full.  III, IV, VI: EOMI. Eyelids elevate symmetrically.  V: Sensation is intact to light touch and symmetrical to face.  VII: Face is symmetrical resting and smiling VIII: hearing intact to voice. IX, X:  Palate elevates symmetrically. Phonation is normal.  KP:Dynloizm shrug 5/5. XII: tongue is midline without fasciculations. Motor: generalized weakness in all 3 extremities, bilateral uppers drift equally and symmetric, right AKA, left leg can wiggle toes and move leg horizontally  Tone: is normal and bulk is normal Sensation- Intact to light touch bilaterally. Extinction absent to light touch to DSS.   Coordination: FTN intact bilaterally, Gait- deferred  ASSESSMENT/PLAN  [name] is a 58 y.o. male with history of  left thalamic stroke, dementia,  BPH, PVD, diabetes, hyperlipidemia, hypertension, OSA, right AKA who initially presented to the hospital 9/4 with hypernatremia, UTI, and AKI.  Initially on arrival he was unresponsive improved and then worsened again yesterday.  MRI was done that shows several acute lacunar infarcts in the left frontal and left posterior temporal/parietal lobes.  NIH on Admission 17  Acute Ischemic Infarct:  bilateral, left frontal posterior temporal/parietal lobe, and acute lacunar infarct right frontal lobe Etiology:  small vessel disease in the setting of infection, dehydration and hypotension  CT head No acute abnormality. Small vessel disease. Atrophy.   Remote lacunar  infarcts in the bilateral basal ganglia MRI  several acute lacunar infarcts involving the left frontal lobe and left posterior temporal/parietal lobe, and a single acute lacunar infarct in the right frontal lobe. MRA  NO LVO  Carotid Doppler bilateral ICAs with 1 to 39% stenosis 2D Echo EF >75%.  LDL 35 HgbA1c 6.1 VTE prophylaxis - Hep SQ  aspirin  325 mg daily and clopidogrel  75 mg daily prior to admission, now on aspirin  81 mg daily and Brilinta  (ticagrelor ) 90 mg bid for 4 weeks and then aspirin  alone. Therapy recommendations:  Pending Outpatient follow-up with neurology after discharge Disposition:  Pending   Hx of Stroke/TIA Dementia  Depression  Multiple old strokes    Hypertension Home meds: Amlodipine  5 mg, carvedilol  12.5 mg, Lasix 20 mg, losartan  50 mg Stable Blood Pressure Goal: SBP less than 160   Hyperlipidemia Home meds: Atorvastatin  80 mg,  resumed in hospital LDL 35, goal < 70 Continue statin at discharge  Diabetes type II Controlled Home meds: Trulicity , insulin , metformin  HgbA1c 6.1, goal < 7.0 CBGs SSI Recommend close follow-up with PCP for better DM control  Former Tobacco Abuse Noted   Substance Abuse Hx of marijuana use  UDS ordered   Dysphagia Patient has post-stroke dysphagia, SLP consulted    Diet   DIET DYS 2 Fluid consistency: Nectar Thick   Advance diet as tolerated  Other Stroke Risk Factors Obesity, Body mass index is 31.96 kg/m., BMI >/= 30 associated with increased stroke risk, recommend weight loss, diet and exercise as appropriate  Obstructive sleep apnea, on CPAP at home  Other Active Problems BPH  Sepsis due to UTI due to chronic catheter  Anemia  AKI  Hypernatremia   Neurology will sign off please call with questions or concerns  Hospital day # 3   Karna Geralds DNP, ACNPC-AG  [facility] I have personally obtained history,examined this patient, reviewed notes, independently viewed imaging studies, participated in medical decision making and plan of care.ROS completed by me personally and pertinent positives fully documented  I have made any additions or clarifications directly to the above note. Agree with note above.  Stroke team will sign off.  Kindly call for questions  Eather Popp, MD Medical Director [facility] Pager: [phone] 01/14/2024 1:33 PM   To contact Stroke Continuity provider, please refer to [email or web address]. After hours, contact General Neurology

## 2024-01-14 NOTE — TOC Progression Note (Signed)
 Transition of Care [facility]) - Progression Note    Patient Details  Name: Ryan Hughes MRN: [id number] Date of Birth: [birth year]  [facility] CM/SW Contact  Olam FORBES Ally, LCSW Phone Number: 01/14/2024, 10:30 AM  Clinical Narrative:    CSW was alerted that pt's niece was inquiring about securing another LTC placement for pt. CSW directed pt's niece to follow up with the social worker at [facility] and should be able to assist her with the search.  TOC team will continue to assist with discharge planning needs.    Expected Discharge Plan: Skilled Nursing Facility Barriers to Discharge: Continued Medical Work up               Expected Discharge Plan and Services     Post Acute Care Choice: Skilled Nursing Facility Living arrangements for the past 2 months: Skilled Nursing Facility                                       Social Drivers of Health (SDOH) Interventions SDOH Screenings   Food Insecurity: Patient Unable To Answer (01/11/2024)  Housing: Unknown (01/11/2024)  Transportation Needs: Patient Unable To Answer (01/11/2024)  Utilities: Patient Unable To Answer (01/11/2024)  Depression (PHQ2-9): High Risk (12/08/2021)  Tobacco Use: Medium Risk (01/11/2024)    Readmission Risk Interventions     No data to display

## 2024-01-14 NOTE — Plan of Care (Signed)
   Problem: Education: Goal: Ability to describe self-care measures that may prevent or decrease complications (Diabetes Survival Skills Education) will improve Outcome: Not Progressing Goal: Individualized Educational Video(s) Outcome: Not Progressing   Problem: Coping: Goal: Ability to adjust to condition or change in health will improve Outcome: Not Progressing   Problem: Fluid Volume: Goal: Ability to maintain a balanced intake and output will improve Outcome: Not Progressing   Problem: Health Behavior/Discharge Planning: Goal: Ability to identify and utilize available resources and services will improve Outcome: Not Progressing Goal: Ability to manage health-related needs will improve Outcome: Not Progressing   Problem: Metabolic: Goal: Ability to maintain appropriate glucose levels will improve Outcome: Not Progressing   Problem: Nutritional: Goal: Maintenance of adequate nutrition will improve Outcome: Not Progressing Goal: Progress toward achieving an optimal weight will improve Outcome: Not Progressing   Problem: Skin Integrity: Goal: Risk for impaired skin integrity will decrease Outcome: Not Progressing   Problem: Tissue Perfusion: Goal: Adequacy of tissue perfusion will improve Outcome: Not Progressing   Problem: Education: Goal: Knowledge of General Education information will improve Description: Including pain rating scale, medication(s)/side effects and non-pharmacologic comfort measures Outcome: Not Progressing   Problem: Health Behavior/Discharge Planning: Goal: Ability to manage health-related needs will improve Outcome: Not Progressing   Problem: Clinical Measurements: Goal: Ability to maintain clinical measurements within normal limits will improve Outcome: Not Progressing Goal: Will remain free from infection Outcome: Not Progressing Goal: Diagnostic test results will improve Outcome: Not Progressing Goal: Respiratory complications will  improve Outcome: Not Progressing Goal: Cardiovascular complication will be avoided Outcome: Not Progressing   Problem: Activity: Goal: Risk for activity intolerance will decrease Outcome: Not Progressing   Problem: Nutrition: Goal: Adequate nutrition will be maintained Outcome: Not Progressing   Problem: Coping: Goal: Level of anxiety will decrease Outcome: Not Progressing   Problem: Elimination: Goal: Will not experience complications related to bowel motility Outcome: Not Progressing Goal: Will not experience complications related to urinary retention Outcome: Not Progressing   Problem: Pain Managment: Goal: General experience of comfort will improve and/or be controlled Outcome: Not Progressing   Problem: Safety: Goal: Ability to remain free from injury will improve Outcome: Not Progressing   Problem: Skin Integrity: Goal: Risk for impaired skin integrity will decrease Outcome: Not Progressing   Problem: Education: Goal: Knowledge of disease or condition will improve Outcome: Not Progressing Goal: Knowledge of secondary prevention will improve (MUST DOCUMENT ALL) Outcome: Not Progressing Goal: Knowledge of patient specific risk factors will improve (DELETE if not current risk factor) Outcome: Not Progressing   Problem: Ischemic Stroke/TIA Tissue Perfusion: Goal: Complications of ischemic stroke/TIA will be minimized Outcome: Not Progressing   Problem: Coping: Goal: Will verbalize positive feelings about self Outcome: Not Progressing Goal: Will identify appropriate support needs Outcome: Not Progressing   Problem: Health Behavior/Discharge Planning: Goal: Ability to manage health-related needs will improve Outcome: Not Progressing Goal: Goals will be collaboratively established with patient/family Outcome: Not Progressing   Problem: Self-Care: Goal: Ability to participate in self-care as condition permits will improve Outcome: Not Progressing Goal:  Verbalization of feelings and concerns over difficulty with self-care will improve Outcome: Not Progressing Goal: Ability to communicate needs accurately will improve Outcome: Not Progressing   Problem: Nutrition: Goal: Risk of aspiration will decrease Outcome: Not Progressing Goal: Dietary intake will improve Outcome: Not Progressing

## 2024-01-14 NOTE — Plan of Care (Signed)
  Problem: Coping: Goal: Ability to adjust to condition or change in health will improve Outcome: Progressing   Problem: Fluid Volume: Goal: Ability to maintain a balanced intake and output will improve Outcome: Progressing   Problem: Clinical Measurements: Goal: Ability to maintain clinical measurements within normal limits will improve Outcome: Progressing Goal: Will remain free from infection Outcome: Progressing Goal: Diagnostic test results will improve Outcome: Progressing Goal: Respiratory complications will improve Outcome: Progressing Goal: Cardiovascular complication will be avoided Outcome: Progressing

## 2024-01-15 ENCOUNTER — Inpatient Hospital Stay (HOSPITAL_COMMUNITY)

## 2024-01-15 DIAGNOSIS — I6381 Other cerebral infarction due to occlusion or stenosis of small artery: Secondary | ICD-10-CM | POA: Diagnosis not present

## 2024-01-15 LAB — CBC WITH DIFFERENTIAL/PLATELET
Abs Immature Granulocytes: 0.17 K/uL — ABNORMAL HIGH (ref 0.00–0.07)
Basophils Absolute: 0 K/uL (ref 0.0–0.1)
Basophils Relative: 0 %
Eosinophils Absolute: 0.2 K/uL (ref 0.0–0.5)
Eosinophils Relative: 1 %
HCT: 20 % — ABNORMAL LOW (ref 39.0–52.0)
Hemoglobin: 6.5 g/dL — CL (ref 13.0–17.0)
Immature Granulocytes: 1 %
Lymphocytes Relative: 12 %
Lymphs Abs: 2.4 K/uL (ref 0.7–4.0)
MCH: 31.4 pg (ref 26.0–34.0)
MCHC: 32.5 g/dL (ref 30.0–36.0)
MCV: 96.6 fL (ref 80.0–100.0)
Monocytes Absolute: 0.7 K/uL (ref 0.1–1.0)
Monocytes Relative: 4 %
Neutro Abs: 16.7 K/uL — ABNORMAL HIGH (ref 1.7–7.7)
Neutrophils Relative %: 82 %
Platelets: 234 K/uL (ref 150–400)
RBC: 2.07 MIL/uL — ABNORMAL LOW (ref 4.22–5.81)
RDW: 14.7 % (ref 11.5–15.5)
WBC: 20.3 K/uL — ABNORMAL HIGH (ref 4.0–10.5)
nRBC: 0.2 % (ref 0.0–0.2)

## 2024-01-15 LAB — CBC
HCT: 21.6 % — ABNORMAL LOW (ref 39.0–52.0)
Hemoglobin: 7 g/dL — ABNORMAL LOW (ref 13.0–17.0)
MCH: 31.5 pg (ref 26.0–34.0)
MCHC: 32.4 g/dL (ref 30.0–36.0)
MCV: 97.3 fL (ref 80.0–100.0)
Platelets: 242 K/uL (ref 150–400)
RBC: 2.22 MIL/uL — ABNORMAL LOW (ref 4.22–5.81)
RDW: 14.6 % (ref 11.5–15.5)
WBC: 21.8 K/uL — ABNORMAL HIGH (ref 4.0–10.5)
nRBC: 0.1 % (ref 0.0–0.2)

## 2024-01-15 LAB — BASIC METABOLIC PANEL WITH GFR
Anion gap: 9 (ref 5–15)
Anion gap: 10 (ref 5–15)
BUN: 40 mg/dL — ABNORMAL HIGH (ref 6–20)
BUN: 37 mg/dL — ABNORMAL HIGH (ref 6–20)
CO2: 20 mmol/L — ABNORMAL LOW (ref 22–32)
CO2: 19 mmol/L — ABNORMAL LOW (ref 22–32)
Calcium: 7.7 mg/dL — ABNORMAL LOW (ref 8.9–10.3)
Calcium: 7.7 mg/dL — ABNORMAL LOW (ref 8.9–10.3)
Chloride: 118 mmol/L — ABNORMAL HIGH (ref 98–111)
Chloride: 116 mmol/L — ABNORMAL HIGH (ref 98–111)
Creatinine, Ser: 1.48 mg/dL — ABNORMAL HIGH (ref 0.61–1.24)
Creatinine, Ser: 1.46 mg/dL — ABNORMAL HIGH (ref 0.61–1.24)
GFR, Estimated: 54 mL/min — ABNORMAL LOW (ref >60)
GFR, Estimated: 55 mL/min — ABNORMAL LOW (ref >60)
Glucose, Bld: 167 mg/dL — ABNORMAL HIGH (ref 70–99)
Glucose, Bld: 258 mg/dL — ABNORMAL HIGH (ref 70–99)
Potassium: 3.6 mmol/L (ref 3.5–5.1)
Potassium: 3.3 mmol/L — ABNORMAL LOW (ref 3.5–5.1)
Sodium: 147 mmol/L — ABNORMAL HIGH (ref 135–145)
Sodium: 145 mmol/L (ref 135–145)

## 2024-01-15 LAB — MRSA NEXT GEN BY PCR, NASAL: MRSA by PCR Next Gen: DETECTED — AB

## 2024-01-15 LAB — RETICULOCYTES
Immature Retic Fract: 23.1 % — ABNORMAL HIGH (ref 2.3–15.9)
RBC.: 2.05 MIL/uL — ABNORMAL LOW (ref 4.22–5.81)
Retic Count, Absolute: 73.2 K/uL (ref 19.0–186.0)
Retic Ct Pct: 3.6 % — ABNORMAL HIGH (ref 0.4–3.1)

## 2024-01-15 LAB — CULTURE, BLOOD (ROUTINE X 2): Special Requests: ADEQUATE

## 2024-01-15 LAB — GLUCOSE, CAPILLARY
Glucose-Capillary: 149 mg/dL — ABNORMAL HIGH (ref 70–99)
Glucose-Capillary: 164 mg/dL — ABNORMAL HIGH (ref 70–99)
Glucose-Capillary: 174 mg/dL — ABNORMAL HIGH (ref 70–99)
Glucose-Capillary: 187 mg/dL — ABNORMAL HIGH (ref 70–99)
Glucose-Capillary: 210 mg/dL — ABNORMAL HIGH (ref 70–99)
Glucose-Capillary: 227 mg/dL — ABNORMAL HIGH (ref 70–99)

## 2024-01-15 LAB — PREPARE RBC (CROSSMATCH)

## 2024-01-15 LAB — URINE CULTURE: Culture: 40000 — AB

## 2024-01-15 MED ORDER — DEXTROSE 5 % IV SOLN: INTRAVENOUS | Status: DC

## 2024-01-15 MED ORDER — PANTOPRAZOLE SODIUM 40 MG IV SOLR
40.0000 mg | Freq: Two times a day (BID) | INTRAVENOUS | Status: DC
  Administered 2024-01-15 – 2024-01-26 (×22): 40 mg via INTRAVENOUS
  Filled 2024-01-15 (×22): qty 10

## 2024-01-15 MED ORDER — MUPIROCIN 2 % EX OINT
1.0000 | TOPICAL_OINTMENT | Freq: Two times a day (BID) | CUTANEOUS | Status: AC
  Administered 2024-01-15 – 2024-01-20 (×8): 1 via NASAL
  Filled 2024-01-15 (×3): qty 22

## 2024-01-15 MED ORDER — IOHEXOL 350 MG/ML SOLN
75.0000 mL | Freq: Once | INTRAVENOUS | Status: AC | PRN
  Administered 2024-01-15: 75 mL via INTRAVENOUS

## 2024-01-15 MED ORDER — SODIUM CHLORIDE 0.9% IV SOLUTION: Freq: Once | INTRAVENOUS | Status: AC

## 2024-01-15 NOTE — Plan of Care (Signed)
  Problem: Coping: Goal: Level of anxiety will decrease Outcome: Progressing   Problem: Safety: Goal: Ability to remain free from injury will improve Outcome: Progressing   Problem: Education: Goal: Ability to describe self-care measures that may prevent or decrease complications (Diabetes Survival Skills Education) will improve Outcome: Not Progressing   Problem: Coping: Goal: Ability to adjust to condition or change in health will improve Outcome: Not Progressing   Problem: Fluid Volume: Goal: Ability to maintain a balanced intake and output will improve Outcome: Not Progressing   Problem: Health Behavior/Discharge Planning: Goal: Ability to identify and utilize available resources and services will improve Outcome: Not Progressing Goal: Ability to manage health-related needs will improve Outcome: Not Progressing   Problem: Nutritional: Goal: Maintenance of adequate nutrition will improve Outcome: Not Progressing Goal: Progress toward achieving an optimal weight will improve Outcome: Not Progressing   Problem: Skin Integrity: Goal: Risk for impaired skin integrity will decrease Outcome: Not Progressing   Problem: Education: Goal: Knowledge of General Education information will improve Description: Including pain rating scale, medication(s)/side effects and non-pharmacologic comfort measures Outcome: Not Progressing   Problem: Health Behavior/Discharge Planning: Goal: Ability to manage health-related needs will improve Outcome: Not Progressing   Problem: Clinical Measurements: Goal: Ability to maintain clinical measurements within normal limits will improve Outcome: Not Progressing Goal: Diagnostic test results will improve Outcome: Not Progressing   Problem: Pain Managment: Goal: General experience of comfort will improve and/or be controlled Outcome: Not Progressing   Problem: Skin Integrity: Goal: Risk for impaired skin integrity will decrease Outcome: Not  Progressing   Problem: Education: Goal: Knowledge of disease or condition will improve Outcome: Not Progressing   Problem: Ischemic Stroke/TIA Tissue Perfusion: Goal: Complications of ischemic stroke/TIA will be minimized Outcome: Not Progressing

## 2024-01-15 NOTE — TOC Progression Note (Signed)
 Transition of Care [facility]) - Progression Note    Patient Details  Name: Ryan Hughes MRN: [id number] Date of Birth: 1965-05-30  [facility] CM/SW Contact  Inocente GORMAN Kindle, LCSW Phone Number: 01/15/2024, 1:45 PM  Clinical Narrative:    CSW continuing to follow for medical progression.    Expected Discharge Plan: Skilled Nursing Facility Barriers to Discharge: Continued Medical Work up               Expected Discharge Plan and Services     Post Acute Care Choice: Skilled Nursing Facility Living arrangements for the past 2 months: Skilled Nursing Facility                                       Social Drivers of Health (SDOH) Interventions SDOH Screenings   Food Insecurity: Patient Unable To Answer (01/11/2024)  Housing: Unknown (01/11/2024)  Transportation Needs: Patient Unable To Answer (01/11/2024)  Utilities: Patient Unable To Answer (01/11/2024)  Depression (PHQ2-9): High Risk (12/08/2021)  Tobacco Use: Medium Risk (01/11/2024)    Readmission Risk Interventions     No data to display

## 2024-01-15 NOTE — Progress Notes (Signed)
 [facility] Progress Note Patient: Ryan Hughes FMW:[id number] DOB: 12/28/1965  DOA: 01/11/2024 DOS: the patient was seen and examined on 01/15/2024  Brief Hospital Course: Patient with PMH of dementia, depression, CVA, HLD, type II DM, PVD with right AKA, wheelchair-bound, BPH with suprapubic catheter present to the hospital with complaints of altered mental status. Presented with confusion. Currently being treated for UTI, AKI, hypernatremia, and stroke.   Assessment and Plan: Acute renal failure Acute hypernatremia Uremia Normal anion gap metabolic acidosis Presents with confusion. Workup shows serum creatinine of 4, sodium of 162, bicarb 19. Creatinine improving with hydration. Continue with D5 for now. Unremarkable US  renal. Suprapubic catheter appears to be functioning okay.  Sepsis secondary to UTI due to chronic indwelling catheter Chronic suprapubic catheter  BPH Patient has chronic suprapubic catheter. Unknown last change time. Suprapubic tubing was changed in the ER. Presented with hypotension, leukocytosis, tachypnea with potential source of infection in the urine. Chest x-ray is clear. No other evidence of infection. Urine culture growing multiple organisms. Currently on Unasyn .  Intractable nausea and vomiting. 9/7 reported to have projectile vomiting. Continues to have nausea with minimal oral intake. X-ray abdomen shows evidence of concern for ileus versus small bowel obstruction. CT abdomen was performed which does not show evidence of bowel obstruction. Shows constipation. Also esophagitis as well as possible UTI.  Dementia without behavioral disturbance (HCC) Depression Acute metabolic encephalopathy History of CVA (cerebrovascular accident) Unwitnessed fall Acute CVA Primary reason for presentation is confusion. Suspect this is in the setting of metabolic abnormality with AKI as well as hyponatremia. Delirium in the setting of dementia in the  setting of UTI cannot be ruled out as well. Metabolic workup not remarkable ammonia, TSH, B1, B12, folic acid . CT head negative for any acute stroke. MRI shows multiple acute lacunar stroke. Neurology consulted. Was on aspirin  and Plavix .  Now on aspirin  and Brilinta  per neurology recommendation.  Carotid Doppler negative. SLP PT OT following. Mentation not improving despite significant improvement in sodium level as well as renal function. Monitor. If oral intake does not improve, need to consider goals of care versus further avenues of nutrition.  HTN Presents with hypotension.  Now blood pressure elevated. Initial blood pressure medications were on hold but now resuming Coreg .   Type 2 diabetes mellitus with vascular disease (HCC) On sliding scale insulin  and metformin  and Trulicity . For now continuing only sliding scale.  Anemia of chronic disease with acute worsening secondary evaluation. Hemoglobin has been stable around 8. On 9/8 hemoglobin dropped down to 7.  No active bleeding seen on the CT scan.  Most likely the drop is secondary to dilution. Aspirin  and Brilinta  on hold. Will recheck hemoglobin and reticulocyte count and resume if H&H is stable. Transfuse for hemoglobin less than 7.  PAD Was on aspirin  Plavix .  Now on aspirin  and Brilinta .  Meds on hold as above. Also has history of AKA on the right. Monitor for now.  Concern for sleep apnea Patient has apneic spell while in the ED on my evaluation. Apneic spell appears to have resolved now. No prior history of sleep apnea. BiPAP as needed.  Elevated troponin Likely in the setting of poor clearance due to AKI rather than demand ischemia. No evidence of EKG evidence of ischemia. Echocardiogram shows EF 75%.  No valvular abnormality.   Subjective: Episode of vomiting yesterday and nausea this morning.  Minimally responsive today.  No chest pain.  No abdominal pain.  Physical Exam: Upper airway crackles. Bowel  sound present. Difficult to assess tenderness due to mentation. S1-S2 present. No edema on left lower extremity. Alert, nonverbal, unable to follow any commands today.  Data Reviewed: I have Reviewed nursing notes, Vitals, and Lab results. Since last encounter, pertinent lab results CBC and BMP   . I have ordered test including CBC and BMP  .   Disposition: Status is: Inpatient Remains inpatient appropriate because: Monitor for improvement in mentation and oral intake  Family Communication: No one at bedside discussed with family on the phone. Level of care: Progressive   Vitals:   01/14/24 2019 01/14/24 2308 01/15/24 0313 01/15/24 1100  BP: (!) 146/74 117/74 115/73 109/73  Pulse: (!) 111 (!) 103 (!) 101 88  Resp: 20 (!) 32 20 20  Temp: 99.3 F (37.4 C) 99.2 F (37.3 C) 99.4 F (37.4 C) 98.5 F (36.9 C)  TempSrc: Oral Oral Oral Axillary  SpO2: 99% 97% 99% 100%  Weight:      Height:         Author: Yetta Blanch, MD 01/15/2024 5:38 PM  Please look on [email or web address] to find out who is on call.

## 2024-01-15 NOTE — Plan of Care (Signed)
   Problem: Education: Goal: Ability to describe self-care measures that may prevent or decrease complications (Diabetes Survival Skills Education) will improve Outcome: Not Progressing Goal: Individualized Educational Video(s) Outcome: Not Progressing   Problem: Coping: Goal: Ability to adjust to condition or change in health will improve Outcome: Not Progressing   Problem: Fluid Volume: Goal: Ability to maintain a balanced intake and output will improve Outcome: Not Progressing   Problem: Health Behavior/Discharge Planning: Goal: Ability to identify and utilize available resources and services will improve Outcome: Not Progressing Goal: Ability to manage health-related needs will improve Outcome: Not Progressing   Problem: Metabolic: Goal: Ability to maintain appropriate glucose levels will improve Outcome: Not Progressing   Problem: Nutritional: Goal: Maintenance of adequate nutrition will improve Outcome: Not Progressing Goal: Progress toward achieving an optimal weight will improve Outcome: Not Progressing   Problem: Skin Integrity: Goal: Risk for impaired skin integrity will decrease Outcome: Not Progressing   Problem: Tissue Perfusion: Goal: Adequacy of tissue perfusion will improve Outcome: Not Progressing   Problem: Education: Goal: Knowledge of General Education information will improve Description: Including pain rating scale, medication(s)/side effects and non-pharmacologic comfort measures Outcome: Not Progressing   Problem: Health Behavior/Discharge Planning: Goal: Ability to manage health-related needs will improve Outcome: Not Progressing   Problem: Clinical Measurements: Goal: Ability to maintain clinical measurements within normal limits will improve Outcome: Not Progressing Goal: Will remain free from infection Outcome: Not Progressing Goal: Diagnostic test results will improve Outcome: Not Progressing Goal: Respiratory complications will  improve Outcome: Not Progressing Goal: Cardiovascular complication will be avoided Outcome: Not Progressing   Problem: Activity: Goal: Risk for activity intolerance will decrease Outcome: Not Progressing   Problem: Nutrition: Goal: Adequate nutrition will be maintained Outcome: Not Progressing   Problem: Coping: Goal: Level of anxiety will decrease Outcome: Not Progressing   Problem: Elimination: Goal: Will not experience complications related to bowel motility Outcome: Not Progressing Goal: Will not experience complications related to urinary retention Outcome: Not Progressing   Problem: Pain Managment: Goal: General experience of comfort will improve and/or be controlled Outcome: Not Progressing   Problem: Safety: Goal: Ability to remain free from injury will improve Outcome: Not Progressing   Problem: Skin Integrity: Goal: Risk for impaired skin integrity will decrease Outcome: Not Progressing   Problem: Education: Goal: Knowledge of disease or condition will improve Outcome: Not Progressing Goal: Knowledge of secondary prevention will improve (MUST DOCUMENT ALL) Outcome: Not Progressing Goal: Knowledge of patient specific risk factors will improve (DELETE if not current risk factor) Outcome: Not Progressing   Problem: Ischemic Stroke/TIA Tissue Perfusion: Goal: Complications of ischemic stroke/TIA will be minimized Outcome: Not Progressing   Problem: Coping: Goal: Will verbalize positive feelings about self Outcome: Not Progressing Goal: Will identify appropriate support needs Outcome: Not Progressing   Problem: Health Behavior/Discharge Planning: Goal: Ability to manage health-related needs will improve Outcome: Not Progressing Goal: Goals will be collaboratively established with patient/family Outcome: Not Progressing   Problem: Self-Care: Goal: Ability to participate in self-care as condition permits will improve Outcome: Not Progressing Goal:  Verbalization of feelings and concerns over difficulty with self-care will improve Outcome: Not Progressing Goal: Ability to communicate needs accurately will improve Outcome: Not Progressing   Problem: Nutrition: Goal: Risk of aspiration will decrease Outcome: Not Progressing Goal: Dietary intake will improve Outcome: Not Progressing

## 2024-01-16 DIAGNOSIS — E440 Moderate protein-calorie malnutrition: Secondary | ICD-10-CM | POA: Insufficient documentation

## 2024-01-16 DIAGNOSIS — I6381 Other cerebral infarction due to occlusion or stenosis of small artery: Secondary | ICD-10-CM | POA: Diagnosis not present

## 2024-01-16 LAB — BASIC METABOLIC PANEL WITH GFR
Anion gap: 10 (ref 5–15)
BUN: 31 mg/dL — ABNORMAL HIGH (ref 6–20)
CO2: 21 mmol/L — ABNORMAL LOW (ref 22–32)
Calcium: 7.8 mg/dL — ABNORMAL LOW (ref 8.9–10.3)
Chloride: 113 mmol/L — ABNORMAL HIGH (ref 98–111)
Creatinine, Ser: 1.48 mg/dL — ABNORMAL HIGH (ref 0.61–1.24)
GFR, Estimated: 54 mL/min — ABNORMAL LOW (ref >60)
Glucose, Bld: 200 mg/dL — ABNORMAL HIGH (ref 70–99)
Potassium: 3.5 mmol/L (ref 3.5–5.1)
Sodium: 144 mmol/L (ref 135–145)

## 2024-01-16 LAB — MAGNESIUM: Magnesium: 2 mg/dL (ref 1.7–2.4)

## 2024-01-16 LAB — GLUCOSE, CAPILLARY
Glucose-Capillary: 147 mg/dL — ABNORMAL HIGH (ref 70–99)
Glucose-Capillary: 164 mg/dL — ABNORMAL HIGH (ref 70–99)
Glucose-Capillary: 165 mg/dL — ABNORMAL HIGH (ref 70–99)
Glucose-Capillary: 165 mg/dL — ABNORMAL HIGH (ref 70–99)
Glucose-Capillary: 170 mg/dL — ABNORMAL HIGH (ref 70–99)
Glucose-Capillary: 174 mg/dL — ABNORMAL HIGH (ref 70–99)

## 2024-01-16 LAB — CBC
HCT: 22.3 % — ABNORMAL LOW (ref 39.0–52.0)
Hemoglobin: 7.3 g/dL — ABNORMAL LOW (ref 13.0–17.0)
MCH: 31.5 pg (ref 26.0–34.0)
MCHC: 32.7 g/dL (ref 30.0–36.0)
MCV: 96.1 fL (ref 80.0–100.0)
Platelets: 214 K/uL (ref 150–400)
RBC: 2.32 MIL/uL — ABNORMAL LOW (ref 4.22–5.81)
RDW: 14.6 % (ref 11.5–15.5)
WBC: 17.5 K/uL — ABNORMAL HIGH (ref 4.0–10.5)
nRBC: 0.2 % (ref 0.0–0.2)

## 2024-01-16 LAB — CULTURE, BLOOD (ROUTINE X 2)
Culture: NO GROWTH
Special Requests: ADEQUATE

## 2024-01-16 MED ORDER — FENTANYL CITRATE PF 50 MCG/ML IJ SOSY
25.0000 ug | PREFILLED_SYRINGE | Freq: Once | INTRAMUSCULAR | Status: AC
  Administered 2024-01-16: 25 ug via INTRAVENOUS
  Filled 2024-01-16: qty 1

## 2024-01-16 MED ORDER — OXYCODONE HCL 5 MG PO TABS
5.0000 mg | ORAL_TABLET | ORAL | Status: DC | PRN
  Administered 2024-01-17 – 2024-01-18 (×2): 5 mg via ORAL
  Filled 2024-01-16 (×3): qty 1

## 2024-01-16 MED ORDER — SIMETHICONE 80 MG PO CHEW
80.0000 mg | CHEWABLE_TABLET | Freq: Four times a day (QID) | ORAL | Status: DC
Start: 2024-01-16 — End: 2024-01-25
  Administered 2024-01-16 – 2024-01-25 (×19): 80 mg via ORAL
  Filled 2024-01-16 (×29): qty 1

## 2024-01-16 MED ORDER — DEXTROSE 5 % IV SOLN: INTRAVENOUS | Status: AC

## 2024-01-16 MED ORDER — NEPRO/CARBSTEADY PO LIQD
237.0000 mL | Freq: Three times a day (TID) | ORAL | Status: DC
  Administered 2024-01-16 – 2024-02-05 (×30): 237 mL via ORAL

## 2024-01-16 NOTE — Progress Notes (Signed)
 [facility] night cross cover note:   I was notified by the patient's RN that the patient is complaining of some abdominal discomfort.  I subsequently ordered fentanyl  25 mcg IV x 1 dose now.     Eva Pore, DO Hospitalist

## 2024-01-16 NOTE — Plan of Care (Signed)
  Problem: Education: Goal: Ability to describe self-care measures that may prevent or decrease complications (Diabetes Survival Skills Education) will improve Outcome: Not Progressing   Problem: Fluid Volume: Goal: Ability to maintain a balanced intake and output will improve Outcome: Not Progressing   Problem: Nutrition: Goal: Risk of aspiration will decrease Outcome: Not Progressing Goal: Dietary intake will improve Outcome: Not Progressing

## 2024-01-16 NOTE — Progress Notes (Signed)
 [facility] Progress Note Patient: Ryan Hughes FMW:[id number] DOB: 07-28-1965  DOA: 01/11/2024 DOS: the patient was seen and examined on 01/16/2024  Brief Hospital Course: Patient with PMH of dementia, depression, CVA, HLD, type II DM, PVD with right AKA, wheelchair-bound, BPH with suprapubic catheter present to the hospital with complaints of altered mental status. Presented with confusion. Currently being treated for UTI, AKI, hypernatremia, and stroke.   Assessment and Plan: Acute renal failure Acute hypernatremia Uremia Normal anion gap metabolic acidosis Presents with confusion. Workup shows serum creatinine of 4, sodium of 162, bicarb 19. Creatinine improving with hydration. Continue with D5 for now. Unremarkable US  renal. Suprapubic catheter appears to be functioning okay.  Sepsis secondary to UTI due to chronic indwelling catheter Chronic suprapubic catheter  BPH Patient has chronic suprapubic catheter. Unknown last change time. Suprapubic tubing was changed in the ER. Presented with hypotension, leukocytosis, tachypnea with potential source of infection in the urine. Chest x-ray is clear. No other evidence of infection. Urine culture growing multiple organisms. Currently on Unasyn .  Intractable nausea and vomiting. Constipation 9/7 reported to have projectile vomiting. Continues to have nausea with minimal oral intake. X-ray abdomen shows evidence of concern for ileus versus small bowel obstruction. CT abdomen was performed which does not show evidence of bowel obstruction. Shows constipation. Also esophagitis as well as possible UTI.  Dementia without behavioral disturbance (HCC) Depression Acute metabolic encephalopathy History of CVA (cerebrovascular accident) Unwitnessed fall Acute CVA Primary reason for presentation is confusion. Suspect this is in the setting of metabolic abnormality with AKI as well as hyponatremia. Delirium in the setting of  dementia in the setting of UTI cannot be ruled out as well. Metabolic workup not remarkable ammonia, TSH, B1, B12, folic acid . CT head negative for any acute stroke. MRI shows multiple acute lacunar stroke. Neurology consulted. Was on aspirin  and Plavix .  Now on aspirin  and Brilinta  per neurology recommendation.  Carotid Doppler negative. SLP PT OT following. Mentation not improving despite significant improvement in sodium level as well as renal function. If oral intake does not improve, need to consider goals of care versus further avenues of nutrition and palliative care will need to be reconsulted. Mentation better on 9/9 as the patient is able to tell me his name and actually able to carry out a conversation and clear speech.  HTN Presents with hypotension.  Now blood pressure elevated. Initial blood pressure medications were on hold but now resuming Coreg .   Type 2 diabetes mellitus with vascular disease (HCC) On sliding scale insulin  and metformin  and Trulicity . For now continuing only sliding scale.  Anemia of chronic disease with acute worsening secondary evaluation. Hemoglobin has been stable around 8. On 9/8 hemoglobin dropped down to 7.  No active bleeding seen on the CT scan.  Most likely the drop is secondary to dilution. Aspirin  and Brilinta  on hold. Will recheck hemoglobin and reticulocyte count and resume if H&H is stable. Transfuse for hemoglobin less than 7.  PAD Was on aspirin  Plavix .  Now on aspirin  and Brilinta .  Meds on hold as above. Also has history of AKA on the right. On 9/9 reports that he had left leg pain.  Will check ABI. Monitor for now.  Concern for sleep apnea in the setting of encephalopathy Patient has apneic spell while in the ED on my evaluation. Apneic spell appears to have resolved now. No prior history of sleep apnea.  Likely apneic spell over in the setting of encephalopathy.  Elevated troponin Likely in  the setting of poor clearance due  to AKI rather than demand ischemia. No evidence of EKG evidence of ischemia. Echocardiogram shows EF 75%.  No valvular abnormality.   Subjective: Reported left leg pain.  No nausea no vomiting.  Was most awake during this admission able to follow commands.  Able to answer questions.  Physical Exam: Clear to auscultation.  Upper airway crackles have resolved. Bowel sound present. Diffusely tender. No left leg edema, poor pulses.  Data Reviewed: I have Reviewed nursing notes, Vitals, and Lab results. Since last encounter, pertinent lab results CBC and BMP   . I have ordered test including CBC and BMP  . I have ordered imaging ABI  .   Disposition: Status is: Inpatient Remains inpatient appropriate because: Monitor for improvement in mentation and oral intake  Family Communication: No one at bedside Level of care: Progressive   Vitals:   01/16/24 1600 01/16/24 1639 01/16/24 1700 01/16/24 1942  BP:  (!) 129/59  127/61  Pulse: 84 82 82 80  Resp: 15 (!) 21 20 20   Temp:    97.9 F (36.6 C)  TempSrc:    Oral  SpO2: 100% 100% 100% 100%  Weight:      Height:         Author: Yetta Blanch, MD 01/16/2024 7:50 PM  Please look on [email or web address] to find out who is on call.

## 2024-01-16 NOTE — Progress Notes (Signed)
 Physical Therapy Treatment Patient Details Name: Ryan Hughes MRN: [id number] DOB: 1965/10/27 Today's Date: 01/16/2024   History of Present Illness 58 yo presenting to [facility] from [facility] due to increasing AMS and sweating x 4 days. Pt hypotensive on arrival. Pt found with acute renal failure, hypernatremia, metabolic acidosis with normal anion gap, sepsis, acute metabolic encephalopathy. MRI (+) Acute lacunar infaracts involving L frontal, L posterior temporal/ parietal lobe and single acute lacunar infarct R frontal lobe. PMH includes CVA w/ L hemiplegia and aphasia, HTN, DMII.    PT Comments  Pt received in supine, lethargic and family requesting PTA not hoyer him OOB to chair this date as pt fatigued from recent bath/transfer OOB to air bed. Pt/family instructed on Prevalon boot positioning and technique to don and can try for 4 hours on/off throughout the day as they report he sometimes does not tolerate air boot type devices. Emphasis on positioning in bed/chair post-CVA, benefits of mobility, pressure relief strategies and family AAROM/PROM to limbs as pt tolerates a few times a day. VSS on RA. Pt continues to benefit from PT services to progress toward functional mobility goals.    If plan is discharge home, recommend the following: Two people to help with walking and/or transfers;Assist for transportation;Assistance with cooking/housework;Supervision due to cognitive status   Can travel by private vehicle     No  Equipment Recommendations  None recommended by PT    Recommendations for Other Services       Precautions / Restrictions Precautions Precautions: Fall Recall of Precautions/Restrictions: Impaired Precaution/Restrictions Comments: Contact; per neuro, SBP goal <160 Restrictions Weight Bearing Restrictions Per Provider Order: No Other Position/Activity Restrictions: chronic AKA     Mobility  Bed Mobility Overal bed mobility: Needs Assistance              General bed mobility comments: pt family defer OOB or EOB mobility; totalA for supine to bed chair posture with use of HOB elevation before BP reading, pt upright in high fowlers position ~5 mins then family requesting he return to supine, HOB at 30 deg at end of session per family request. Bed alarm activated in new air bed for pt safety.    Transfers                   General transfer comment: pt family defer, wanting to let him rest    Ambulation/Gait                   Stairs             Wheelchair Mobility     Tilt Bed    Modified Rankin (Stroke Patients Only) Modified Rankin (Stroke Patients Only) Pre-Morbid Rankin Score: Severe disability Modified Rankin: Severe disability     Balance       Sitting balance - Comments: pt family defer EOB/long sitting today                                    Communication Communication Communication: Impaired Factors Affecting Communication: Difficulty expressing self;Other (comment) (aphasic with hx CVA)  Cognition Arousal: Lethargic Behavior During Therapy: Flat affect   PT - Cognitive impairments: History of cognitive impairments, Sequencing, Awareness, Attention, Initiation, Difficult to assess Difficult to assess due to: Impaired communication                     PT -  Cognition Comments: pt appropriately nods/shakes head in response to 2/3 questions, keeping eyes closed, per family he has done a lot today and requesting he be allowed to rest; family receptive to instruction from PTA Following commands: Impaired Following commands impaired: Follows one step commands inconsistently    Cueing Cueing Techniques: Verbal cues  Exercises Other Exercises Other Exercises: pt family instructed on positioning in bed/chair (handout given to reinforce) and pressure relief frequency in bed and in chair    General Comments General comments (skin integrity, edema, etc.): BP 116/59  (73) SpO2 98% on RA with HOB at 60 deg      Pertinent Vitals/Pain Pain Assessment Pain Assessment: PAINAD Breathing: normal Negative Vocalization: none Facial Expression: smiling or inexpressive Body Language: relaxed Consolability: no need to console PAINAD Score: 0 Pain Intervention(s): Limited activity within patient's tolerance, Relaxation, Other (comment) (family defers PTA assisting him to roll or sit up, only agreeable to [facility] elevated for BP assessment in chair posture, then requesting HOB lowered before end of session.)    Home Living                          Prior Function            PT Goals (current goals can now be found in the care plan section) Acute Rehab PT Goals Patient Stated Goal: To return to SNF per family PT Goal Formulation: Patient unable to participate in goal setting Time For Goal Achievement: 01/26/24 Progress towards PT goals: Progressing toward goals (limited session today per family request)    Frequency    Min 1X/week      PT Plan      Co-evaluation              AM-PAC PT 6 Clicks Mobility   Outcome Measure  Help needed turning from your back to your side while in a flat bed without using bedrails?: Total Help needed moving from lying on your back to sitting on the side of a flat bed without using bedrails?: Total Help needed moving to and from a bed to a chair (including a wheelchair)?: Total Help needed standing up from a chair using your arms (e.g., wheelchair or bedside chair)?: Total Help needed to walk in hospital room?: Total Help needed climbing 3-5 steps with a railing? : Total 6 Click Score: 6    End of Session   Activity Tolerance: Patient limited by fatigue;Other (comment) (family defer hoyer OOB at time of session) Patient left: in bed;with call bell/phone within reach;with bed alarm set;Other (comment) (LLE prevalon boot donned (totalA by PTA); family instructed on prevalon boots frequency and  adjustment for proper positioning) Nurse Communication: Mobility status;Need for lift equipment;Other (comment) (hoyer for OOB baseline) PT Visit Diagnosis: Other abnormalities of gait and mobility (R26.89)     Time: 8457-8444 PT Time Calculation (min) (ACUTE ONLY): 13 min  Charges:    $Therapeutic Activity: 8-22 mins PT General Charges $$ ACUTE PT VISIT: 1 Visit                     Dahmir Epperly P., PTA Acute Rehabilitation Services Secure Chat Preferred 9a-5:30pm Office: [phone]    Connell HERO [facility] 01/16/2024, 5:15 PM

## 2024-01-16 NOTE — Progress Notes (Signed)
 Initial Nutrition Assessment  DOCUMENTATION CODES:   Non-severe (moderate) malnutrition in context of chronic illness (dementia)  INTERVENTION:   48 hr calorie count initiated; assess adequacy of PO intake and determine if GOC discussion or nutrition support is appropriate  Nepro Shake po TID, each supplement provides 425 kcal and 19 grams protein  Encouraged adequate intake of DYS 2 diet to meet calorie and protein needs   NUTRITION DIAGNOSIS:   Moderate Malnutrition related to chronic illness (dementia) as evidenced by mild fat depletion, mild muscle depletion.  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 Admitted with AMS, diagnosed UTI, AKI, hypernatremia, and code stroke.  9/4 admitted; MRI shows acute lacunar stroke 9/5 SLP bedside eval recommends DYS 1 w/ nectar thick 9/6 advanced to DYS 2 w/ nectar thick   48 hr calorie count ordered per MD request. PO intake appears poor and MD assessing if GOC discussion needs to be initiated or if nutrition support is appropriate at this time.   Pt sleeping in bed at time of assessment and did not easily awake to touch or voice. Unable to obtain diet/wt hx at this time as no visitors present in room and pt unable to provide information. Nutrition focused physical exam shows mild fat depletions and mild muscle depletions. Pt is wheelchair bound and has R AKA so lower extremity findings will not be used in malnutrition diagnosis. There is limited wt hx in chart to assess if depletions align with any recent wt loss. Suspect malnutrition in the context of dementia is contributing to physical findings. Pt previously living in SNF PTA. Pt likely receiving 3  meals per day but unsure of typical day intake. Diet summary documentation while admitted shows pt only consuming on average 10% of meals.  Average Meal Completion  9/7-9/8: 10% average intake x 4 recorded meals  Medications reviewed and include:  SSI Novolog  q4 hr Protonix  Unasyn  Dextrose  5% 125 ml/hr  Labs reviewed:  CBG x 24 hr: 149/227 mg/dL Chloride 886 BUN 68/Rmzjupwpwz 1.48 Potassium 3.5<--3.3 Hgb 7.3<--6.5  NUTRITION - FOCUSED PHYSICAL EXAM:  Wheelchair bound [employer] Most Recent Value  Orbital Region Moderate depletion  Upper Arm Region Mild depletion  Thoracic and Lumbar Region Mild depletion  Buccal Region Mild depletion  Temple Region Moderate depletion  Clavicle Bone Region Mild depletion  Clavicle and Acromion Bone Region Mild depletion  Scapular Bone Region Mild depletion  Dorsal Hand Mild depletion  Patellar Region Mild depletion  [R AKA]  Anterior Thigh Region Mild depletion  [R AKA]  Posterior Calf Region Mild depletion  [R AKA]  Edema (RD Assessment) None  Hair Reviewed  Eyes Unable to assess  [sleeping]  Mouth Unable to assess  [sleeping]  Skin Reviewed  Nails Reviewed    Diet Order:   Diet Order             DIET DYS 2 Fluid consistency: Nectar Thick  Diet effective now                   EDUCATION NEEDS:   Not appropriate for education at this time  Skin:  Skin Assessment: Skin Integrity Issues: Skin Integrity Issues:: Stage II, Incisions Stage II: L buttocks Incisions:  R thigh  Last BM:  9/8 type 5 and 6  Height:   Ht Readings from Last 1 Encounters:  01/11/24 5' 6 (1.676 m)    Weight:   Wt Readings from Last 1 Encounters:  01/11/24 89.8 kg    Ideal Body Weight:  59.3 kg adjusted for R AKA  BMI:  Body mass index is 31.96 kg/m.  Estimated Nutritional Needs:   Kcal:  1500-1700  Protein:  70-85g  Fluid:  1.5-1.7L    Josette Glance, MS, RDN, LDN Clinical Dietitian I Please reach out via secure  chat

## 2024-01-17 ENCOUNTER — Inpatient Hospital Stay (HOSPITAL_COMMUNITY)

## 2024-01-17 DIAGNOSIS — E870 Hyperosmolality and hypernatremia: Secondary | ICD-10-CM | POA: Diagnosis not present

## 2024-01-17 DIAGNOSIS — E861 Hypovolemia: Secondary | ICD-10-CM

## 2024-01-17 DIAGNOSIS — N179 Acute kidney failure, unspecified: Secondary | ICD-10-CM | POA: Diagnosis not present

## 2024-01-17 DIAGNOSIS — K921 Melena: Secondary | ICD-10-CM

## 2024-01-17 DIAGNOSIS — I7090 Unspecified atherosclerosis: Secondary | ICD-10-CM | POA: Diagnosis not present

## 2024-01-17 DIAGNOSIS — D500 Iron deficiency anemia secondary to blood loss (chronic): Secondary | ICD-10-CM | POA: Diagnosis not present

## 2024-01-17 DIAGNOSIS — I6349 Cerebral infarction due to embolism of other cerebral artery: Secondary | ICD-10-CM

## 2024-01-17 DIAGNOSIS — I6381 Other cerebral infarction due to occlusion or stenosis of small artery: Secondary | ICD-10-CM | POA: Diagnosis not present

## 2024-01-17 LAB — CBC
HCT: 20.9 % — ABNORMAL LOW (ref 39.0–52.0)
Hemoglobin: 6.8 g/dL — CL (ref 13.0–17.0)
MCH: 31.5 pg (ref 26.0–34.0)
MCHC: 32.5 g/dL (ref 30.0–36.0)
MCV: 96.8 fL (ref 80.0–100.0)
Platelets: 212 K/uL (ref 150–400)
RBC: 2.16 MIL/uL — ABNORMAL LOW (ref 4.22–5.81)
RDW: 14.6 % (ref 11.5–15.5)
WBC: 10 K/uL (ref 4.0–10.5)
nRBC: 0 % (ref 0.0–0.2)

## 2024-01-17 LAB — OCCULT BLOOD X 1 CARD TO LAB, STOOL: Fecal Occult Bld: POSITIVE — AB

## 2024-01-17 LAB — BASIC METABOLIC PANEL WITH GFR
Anion gap: 10 (ref 5–15)
BUN: 16 mg/dL (ref 6–20)
CO2: 21 mmol/L — ABNORMAL LOW (ref 22–32)
Calcium: 7.6 mg/dL — ABNORMAL LOW (ref 8.9–10.3)
Chloride: 112 mmol/L — ABNORMAL HIGH (ref 98–111)
Creatinine, Ser: 1.08 mg/dL (ref 0.61–1.24)
GFR, Estimated: >60 mL/min (ref >60)
Glucose, Bld: 93 mg/dL (ref 70–99)
Potassium: 3.5 mmol/L (ref 3.5–5.1)
Sodium: 143 mmol/L (ref 135–145)

## 2024-01-17 LAB — GLUCOSE, CAPILLARY
Glucose-Capillary: 165 mg/dL — ABNORMAL HIGH (ref 70–99)
Glucose-Capillary: 160 mg/dL — ABNORMAL HIGH (ref 70–99)
Glucose-Capillary: 166 mg/dL — ABNORMAL HIGH (ref 70–99)
Glucose-Capillary: 94 mg/dL (ref 70–99)
Glucose-Capillary: 82 mg/dL (ref 70–99)
Glucose-Capillary: 102 mg/dL — ABNORMAL HIGH (ref 70–99)

## 2024-01-17 LAB — HAPTOGLOBIN: Haptoglobin: 253 mg/dL (ref 29–370)

## 2024-01-17 LAB — VAS US ABI WITH/WO TBI

## 2024-01-17 LAB — PREPARE RBC (CROSSMATCH)

## 2024-01-17 LAB — HEMOGLOBIN AND HEMATOCRIT, BLOOD
HCT: 24.2 % — ABNORMAL LOW (ref 39.0–52.0)
Hemoglobin: 8 g/dL — ABNORMAL LOW (ref 13.0–17.0)

## 2024-01-17 LAB — MAGNESIUM: Magnesium: 1.9 mg/dL (ref 1.7–2.4)

## 2024-01-17 MED ORDER — HYDROMORPHONE HCL 1 MG/ML IJ SOLN
1.0000 mg | INTRAMUSCULAR | Status: DC | PRN
  Administered 2024-01-17 – 2024-01-26 (×3): 1 mg via INTRAVENOUS
  Filled 2024-01-17 (×3): qty 1

## 2024-01-17 MED ORDER — MEDIHONEY WOUND/BURN DRESSING EX PSTE
1.0000 | PASTE | Freq: Every day | CUTANEOUS | Status: DC
  Administered 2024-01-17 – 2024-02-05 (×16): 1 via TOPICAL
  Filled 2024-01-17 (×3): qty 44

## 2024-01-17 MED ORDER — SODIUM CHLORIDE 0.9% IV SOLUTION
Freq: Once | INTRAVENOUS | Status: DC
Start: 2024-01-17 — End: 2024-01-31

## 2024-01-17 NOTE — Consult Note (Addendum)
 WOC Nurse Consult Note:  WOC consult performed remotely utilizing imaging and chart review. Reason for Consult: non-healing sacral wound Wound type: appearance is consistent with an evolving deep tissue pressure injury to sacrum Pressure Injury POA: Bedside nurse noted a stage 2 on 9/4 (admission), currently evolving DTPI Measurement: see nursing flow sheets Wound bed: 50% pink/red, moist, 50% yellow slough around periphery of wound Drainage (amount, consistency, odor) see nursing flow sheets Periwound: deep purple maroon discoloration surrounding wound Dressing procedure/placement/frequency:  Cleanse wound with NS, pat dry.  Apply Apply 1/4 thick layer of leptospermum honey to wound bed,  top with silicone foam,  change daily. Ok to lift silicone foam to reapply Medihoney daily.    WOC Nurse team will follow with you and see patient within 10 days for wound assessments.  Please notify WOC nurses of any acute changes in the wounds or any new areas of concern.  Doyal Polite, RN, MSN, [facility] WOC Team [phone] (Available Mon-Fri [phone])

## 2024-01-17 NOTE — Progress Notes (Signed)
 Speech Language Pathology Treatment: Dysphagia  Patient Details Name: Ryan Hughes MRN: [id number] DOB: November 12, 1965 Today's Date: 01/17/2024 Time: 8968-8960 SLP Time Calculation (min) (ACUTE ONLY): 8 min  Assessment / Plan / Recommendation Clinical Impression  Pt seen for brief session.  Repositioned upright. He continues to wince, change position when swallowing.  Provided with sips of nectar thick liquid, then thin liquids, to determine if liquids can be advanced from thickened. However, pt in obvious pain. When asked location he was able to state my stomach, although unable to provide numbers on pain scale due to his aphasia.  He declined further POs, inhibiting ability to determine readiness for diet advancement.   PO trials discontinued and RN informed- she will let MD know.  SLP will continue to follow.   HPI HPI: 58 y.o. male presented from SNF with AMS. Dx sepsis secondary to UTI with chronic suprapubic catheter, ARF, hypernatremia, uremia. MRI 9/5 pm showed (+) Acute lacunar infaracts involving L frontal, L posterior temporal/ parietal lobe and single acute lacunar infarct R frontal lobe.  Recent poor PO intake and dehydration per notes. PMHx dementia, prior CVA, depression, HLD, PVD with right AKA. Prior MBS 12/17/21- regular diet, overall weakness of pharyngeal constriction, tongue propulsion, premature spillage. No aspiration. ST f/u for diet tolerance/potential progression and speech/language cognitive assessment.      SLP Plan  Continue with current plan of care          Recommendations  Diet recommendations: Dysphagia 2 (fine chop);Nectar-thick liquid Liquids provided via: Cup;Straw Medication Administration: Whole meds with puree Supervision: Staff to assist with self feeding;Full supervision/cueing for compensatory strategies Compensations: Slow rate;Small sips/bites;Minimize environmental distractions;Monitor for anterior loss;Follow solids with liquid Postural  Changes and/or Swallow Maneuvers: Seated upright 90 degrees                  Oral care BID   Frequent or constant Supervision/Assistance Dysphagia, unspecified (R13.10)     Continue with current plan of care    Dalaina Tates L. Vona, MA CCC/SLP Clinical Specialist - Acute Care SLP Acute Rehabilitation Services Office number [phone]  Vona Palma Laurice  01/17/2024, 10:42 AM

## 2024-01-17 NOTE — Progress Notes (Signed)
 Patient refused lab for repeat H&H ,MD notified

## 2024-01-17 NOTE — Consult Note (Addendum)
 Consultation  Referring Provider: TRH/Rai Primary Care Physician:  [name], MD Primary Gastroenterologist:  unassigned  Reason for Consultation: Melena, anemia  HPI: Ryan Hughes is a 58 y.o. male with history of dementia, previous CVA with left hemiaplasia and aphasia, hypertension, diabetes mellitus, peripheral vascular disease, status post previous right AKA and with history of BPH requiring chronic suprapubic catheter.  Patient had been on aspirin  and Plavix  apparently as an outpatient at skilled nursing facility.  He was brought to the ER 5 days ago with onset of altered mental status and sweats. Workup showed acute renal failure with metabolic acidosis, felt to be septic secondary to UTI.  Due to persistence of the altered mental status he also underwent MRI the following day which showed several acute lacunar infarcts. He has been seen by neurology who recommended changing him to aspirin  and Brilinta .  That was initiated.  He is also been on subcu heparin  On 01/14/2024 had nausea and vomiting per the notes but no evidence of hematemesis. CT of the abdomen and pelvis was done at that time with contrast that showed new bilateral small pleural effusions with adjacent dependent airspace opacities likely atelectasis, normal-appearing liver, incompletely distended gallbladder no gallstones noted.  There was mention of distal esophageal wall thickening similar to previous study stomach unremarkable no bowel abnormality though stool throughout the colon. He is hemoglobin was noted to drop on 9 9 and Brilinta  was held. Nursing reported that he had black stools last night.  Brilinta  continued on hold and continues on aspirin .  Patient is unable to participate in any history.  His nurse today reports that he had 1 dark stool early this morning no frankly bloody stool and no further bowel movements during the day today, no nausea or vomiting He is being fed a dysphagia 2 diet. No family  at bedside  Labs today hemoglobin 6.8/WBC 10,/hematocrit 20.9/platelets 212 Stool documented heme positive Bmet t pending  Review of labs-hemoglobin 7.3 yesterday, 6.5 on 01/15/2024 and transfused 1 unit. Hemoglobin 8.0/hematocrit 25.8/MCV 101.2 on admit  Globin 11.4 5/25  No prior endoscopic evaluation in epic or Care Everywhere.  Patient has been hemodynamically stable    Past Medical History:  Diagnosis Date   Acute arterial ischemic stroke, vertebrobasilar, thalamic, left (HCC) 04/25/2020   Acute CVA (cerebrovascular accident) (HCC) 03/27/2021   BPH (benign prostatic hyperplasia)    Diabetes mellitus without complication (HCC)    HLD (hyperlipidemia)    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   Hypertension    Nicotine  dependence, cigarettes, uncomplicated 04/25/2020   Sleep apnea 01/11/2024   Stroke [facility])     Past Surgical History:  Procedure Laterality Date   ABDOMINAL AORTOGRAM W/LOWER EXTREMITY N/A 02/02/2022   Procedure: ABDOMINAL AORTOGRAM W/LOWER EXTREMITY;  Surgeon: Lanis Fonda BRAVO, MD;  Location: [facility] INVASIVE CV LAB;  Service: Cardiovascular;  Laterality: N/A;   AMPUTATION Right 02/17/2022   Procedure: AMPUTATION DIGIT GREAT AND SECOND TOES;  Surgeon: Sheree Penne Bruckner, MD;  Location: [facility] OR;  Service: Vascular;  Laterality: Right;   AMPUTATION Right 03/22/2022   Procedure: AMPUTATION ABOVE KNEE;  Surgeon: Sheree Penne Bruckner, MD;  Location: [facility] OR;  Service: Vascular;  Laterality: Right;   BYPASS GRAFT FEMORAL-PERONEAL Right 02/17/2022   Procedure: Right BYPASS GRAFT GURNEY;  Surgeon: Sheree Penne Bruckner, MD;  Location: [facility] OR;  Service: Vascular;  Laterality: Right;   ENDARTERECTOMY FEMORAL Right 02/17/2022   Procedure: ENDARTERECTOMY FEMORAL RIGHT;  Surgeon: Sheree Penne Bruckner, MD;  Location:  [facility] OR;  Service: Vascular;  Laterality: Right;   INSERTION OF ILIAC STENT  02/17/2022   Procedure: INSERTION OF  PERONEAL STENT;  Surgeon: Sheree Penne Bruckner, MD;  Location: [facility] OR;  Service: Vascular;;   IR CATHETER TUBE CHANGE  09/19/2022   LOWER EXTREMITY ANGIOGRAM  02/17/2022   Procedure: LOWER EXTREMITY ANGIOGRAM;  Surgeon: Sheree Penne Bruckner, MD;  Location: [facility] OR;  Service: Vascular;;    Prior to Admission medications   Medication Sig Start Date End Date Taking? Authorizing Provider  acetaminophen  (TYLENOL ) 325 MG tablet Take 650 mg by mouth 2 (two) times daily as needed (pain).   Yes [provider]  acetaminophen  (TYLENOL ) 650 MG CR tablet Take 650 mg by mouth daily.   Yes [provider]  amLODipine  (NORVASC ) 5 MG tablet Take 5 mg by mouth daily.   Yes [provider]  aspirin  325 MG tablet Take 1 tablet (325 mg total) by mouth daily. 02/09/22  Yes Rizwan, Saima, MD  atorvastatin  (LIPITOR ) 80 MG tablet Take 1 tablet (80 mg total) by mouth at bedtime. 12/10/21  Yes Celestia Rosaline SQUIBB, NP  B Complex Vitamins (B COMPLEX PO) Take 1 tablet by mouth daily.   Yes [provider]  barrier cream (NON-SPECIFIED) CREA Apply 1 Application topically See admin instructions. Apply barrier cream with all incontinent care three times daily, every shift.   Yes [provider]  carvedilol  (COREG ) 12.5 MG tablet Take 1 tablet (12.5 mg total) by mouth 2 (two) times daily with a meal. 02/08/22  Yes Earley Saucer, MD  clopidogrel  (PLAVIX ) 75 MG tablet Take 1 tablet (75 mg total) by mouth daily at 6 (six) AM. 02/26/22  Yes Baglia, Corrina, PA-C  Dulaglutide  (TRULICITY ) 0.75 MG/0.5ML SOPN Inject 0.75 mg into the skin once a week. Patient taking differently: Inject 0.75 mg into the skin every Wednesday. 12/08/21  Yes Celestia Rosaline SQUIBB, NP  dutasteride  (AVODART ) 0.5 MG capsule Take 1 capsule (0.5 mg total) by mouth daily. 12/20/21  Yes Odell Celinda Balo, MD  ferrous gluconate (FERGON) 324 MG tablet Take 324 mg by mouth every other day.   Yes [provider]   furosemide (LASIX) 20 MG tablet Take 20 mg by mouth every other day.   Yes [provider]  insulin  aspart (NOVOLOG ) 100 UNIT/ML injection Inject 0-15 Units into the skin 3 (three) times daily with meals. CBG 70 - 120: 0 units  CBG 121 - 150: 2 units  CBG 151 - 200: 3 units  CBG 201 - 250: 5 units  CBG 251 - 300: 8 units  CBG 301 - 350: 11 units  CBG 351 - 400: 15 units Patient taking differently: Inject 0-8 Units into the skin daily. BG 200-250 :2 units BG 251-300 :4 units BG 301-350 :6 units BG 351-400 :8 units 02/08/22  Yes Rizwan, Saima, MD  losartan  (COZAAR ) 50 MG tablet Take 1 tablet (50 mg total) by mouth daily. Patient taking differently: Take 1 tablet by mouth See admin instructions. Give 1 tablet by mouth one time a day for hypertension 75mg  12/20/21  Yes Odell Celinda Balo, MD  metFORMIN  (GLUCOPHAGE ) 1000 MG tablet Take 1 tablet (1,000 mg total) by mouth 2 (two) times daily with a meal. Patient taking differently: Take 1,000 mg by mouth daily. 04/19/21 01/10/25 Yes Celestia Rosaline SQUIBB, NP  PARoxetine (PAXIL) 10 MG tablet Take 10 mg by mouth at bedtime.   Yes [provider]  senna-docusate (SENOKOT-S) 8.6-50 MG tablet Take 1 tablet  by mouth at bedtime as needed for mild constipation. Patient taking differently: Take 1 tablet by mouth at bedtime as needed (constipation). 02/08/22  Yes Rizwan, Saima, MD  sodium chloride  (OCEAN) 0.65 % SOLN nasal spray Place 1 spray into both nostrils daily.   Yes [provider]  tamsulosin  (FLOMAX ) 0.4 MG CAPS capsule Take 2 capsules (0.8 mg total) by mouth daily. 12/20/21  Yes Odell Celinda Balo, MD  White Petrolatum (VASELINE EX) Place 1 application  into both nostrils daily.   Yes [provider]    Current Facility-Administered Medications  Medication Dose Route Frequency Provider Last Rate Last Admin    stroke: early stages of recovery book   Does not apply Once Tobie Yetta HERO, MD       0.9 %  sodium  chloride infusion (Manually program via Guardrails IV Fluids)   Intravenous Once Howerter, Justin B, DO       acetaminophen  (TYLENOL ) tablet 650 mg  650 mg Oral Q6H PRN Patel, Pranav M, MD       Or   acetaminophen  (TYLENOL ) suppository 650 mg  650 mg Rectal Q6H PRN Patel, Pranav M, MD       Ampicillin -Sulbactam (UNASYN ) 3 g in sodium chloride  0.9 % 100 mL IVPB  3 g Intravenous Q6H Pham, Minh Q, RPH-CPP 200 mL/hr at 01/17/24 0901 3 g at 01/17/24 0901   aspirin  EC tablet 81 mg  81 mg Oral Daily Patel, Pranav M, MD   81 mg at 01/17/24 0850   atorvastatin  (LIPITOR ) tablet 80 mg  80 mg Oral QHS Patel, Pranav M, MD   80 mg at 01/16/24 2139   carvedilol  (COREG ) tablet 12.5 mg  12.5 mg Oral BID WC Patel, Pranav M, MD   12.5 mg at 01/17/24 0850   dextrose  5 % solution   Intravenous Continuous Patel, Pranav M, MD 125 mL/hr at 01/17/24 1134 New Bag at 01/17/24 1134   dutasteride  (AVODART ) capsule 0.5 mg  0.5 mg Oral Daily Patel, Pranav M, MD   0.5 mg at 01/17/24 0853   feeding supplement (NEPRO CARB STEADY) liquid 237 mL  237 mL Oral TID BM Patel, Pranav M, MD   237 mL at 01/17/24 0857   HYDROmorphone  (DILAUDID ) injection 1 mg  1 mg Intravenous Q4H PRN Rai, Ripudeep K, MD   1 mg at 01/17/24 1138   insulin  aspart (novoLOG ) injection 0-9 Units  0-9 Units Subcutaneous Q4H Tobie Yetta HERO, MD   2 Units at 01/17/24 1310   leptospermum manuka honey (MEDIHONEY) paste 1 Application  1 Application Topical Daily Rai, Ripudeep K, MD   1 Application at 01/17/24 1311   mupirocin  ointment (BACTROBAN ) 2 % 1 Application  1 Application Nasal BID Tobie Yetta HERO, MD   1 Application at 01/17/24 0857   ondansetron  (ZOFRAN ) tablet 4 mg  4 mg Oral Q6H PRN Patel, Pranav M, MD   4 mg at 01/15/24 9193   Or   ondansetron  (ZOFRAN ) injection 4 mg  4 mg Intravenous Q6H PRN Patel, Pranav M, MD   4 mg at 01/14/24 0302   oxyCODONE  (Oxy IR/ROXICODONE ) immediate release tablet 5 mg  5 mg Oral Q4H PRN Patel, Pranav M, MD       pantoprazole   (PROTONIX ) injection 40 mg  40 mg Intravenous Q12H Patel, Pranav M, MD   40 mg at 01/17/24 0850   simethicone  (MYLICON) chewable tablet 80 mg  80 mg Oral QID Patel, Pranav M, MD   80 mg at 01/17/24  1311   tamsulosin  (FLOMAX ) capsule 0.8 mg  0.8 mg Oral Daily Tobie Jericho M, MD   0.8 mg at 01/17/24 0850    Allergies as of 01/11/2024 - Review Complete 01/11/2024  Allergen Reaction Noted   Pork-derived products Other (See Comments) 03/27/2021   Shellfish-derived products Other (See Comments) 03/27/2021    Family History  Problem Relation Age of Onset   Stroke Mother     Social History   Socioeconomic History   Marital status: Single    Spouse name: Not on file   Number of children: Not on file   Years of education: Not on file   Highest education level: Not on file  Occupational History   Not on file  Tobacco Use   Smoking status: Former    Current packs/day: 0.00    Types: Cigarettes    Quit date: 12/07/2021    Years since quitting: 2.1   Smokeless tobacco: Never  Vaping Use   Vaping status: Never Used  Substance and Sexual Activity   Alcohol use: Yes   Drug use: Yes    Types: Marijuana   Sexual activity: Not Currently  Other Topics Concern   Not on file  Social History Narrative   Not on file   Social Drivers of Health   Financial Resource Strain: Not on file  Food Insecurity: Patient Unable To Answer (01/11/2024)   Hunger Vital Sign    Worried About Running Out of Food in the Last Year: Patient unable to answer    Ran Out of Food in the Last Year: Patient unable to answer  Transportation Needs: Patient Unable To Answer (01/11/2024)   PRAPARE - Transportation    Lack of Transportation (Medical): Patient unable to answer    Lack of Transportation (Non-Medical): Patient unable to answer  Physical Activity: Not on file  Stress: Not on file  Social Connections: Not on file  Intimate Partner Violence: Patient Unable To Answer (01/11/2024)   Humiliation, Afraid, Rape,  and Kick questionnaire    Fear of Current or Ex-Partner: Patient unable to answer    Emotionally Abused: Patient unable to answer    Physically Abused: Patient unable to answer    Sexually Abused: Patient unable to answer    Review of Systems: Pertinent positive and negative review of systems were noted in the above HPI section.  All other review of systems was otherwise negative.   Physical Exam: Vital signs in last 24 hours: Temp:  [97.8 F (36.6 C)-99.4 F (37.4 C)] 98 F (36.7 C) (09/10 1157) Pulse Rate:  [76-88] 76 (09/10 1157) Resp:  [11-22] 11 (09/10 1157) BP: (97-167)/(56-93) 114/60 (09/10 1157) SpO2:  [97 %-100 %] 99 % (09/10 1157) Last BM Date : 01/17/24 General:   Alert,  Well-developed, acute and chronically ill-appearing older African-American male, opens eyes to voice but no verbal response Head:  Normocephalic and atraumatic. Eyes:  Sclera clear, no icterus.   Conjunctiva pale Ears:  Normal auditory acuity. Nose:  No deformity, discharge,  or lesions. Mouth:  No deformity or lesions.   Neck:  Supple; no masses or thyromegaly. Lungs:  Clear throughout to auscultation.   No wheezes, crackles, or rhonchi.  Heart:  Regular rate and rhythm; no murmurs, clicks, rubs,  or gallops. Abdomen:  Soft, protuberant , nontender ,BS active,nonpalp mass or hsm.  Prepubic catheter in place Rectal: Not done, stool documented heme positive last p.m. Msk:  Symmetrical without gross deformities. . Pulses:  Normal pulses noted. Extremities: That is  post right AKA Neurologic: Somnolent, opens eyes to voice but no verbal response Skin:  Intact without significant lesions or rashes..   Intake/Output from previous day: 09/09 0701 - 09/10 0700 In: 1926.2 [P.O.:510; I.V.:424.2; Blood:315; IV Piggyback:677] Out: 925 [Urine:925] Intake/Output this shift: Total I/O In: 364 [Blood:364] Out: -   Lab Results: Recent Labs    01/15/24 2016 01/16/24 0652 01/17/24 0304  WBC 20.3* 17.5*  10.0  HGB 6.5* 7.3* 6.8*  HCT 20.0* 22.3* 20.9*  PLT 234 214 212   BMET Recent Labs    01/15/24 0957 01/15/24 2016 01/16/24 0652  NA 147* 145 144  K 3.6 3.3* 3.5  CL 118* 116* 113*  CO2 20* 19* 21*  GLUCOSE 167* 258* 200*  BUN 40* 37* 31*  CREATININE 1.48* 1.46* 1.48*  CALCIUM  7.7* 7.7* 7.8*   LFT No results for input(s): PROT, ALBUMIN, AST, ALT, ALKPHOS, BILITOT, BILIDIR, IBILI in the last 72 hours. PT/INR No results for input(s): LABPROT, INR in the last 72 hours. Hepatitis Panel No results for input(s): HEPBSAG, HCVAB, HEPAIGM, HEPBIGM in the last 72 hours.   IMPRESSION:  #60 58 year old African-American male with history of dementia, previous CVA with left hemiaplasia, hypertension, diabetes mellitus, peripheral vascular disease with previous right AKA and chronic suprapubic catheter who was admitted 6 days ago with altered mental status and sweats  Diagnosed with acute renal failure, and sepsis secondary to UTI  MRI was also done and unfortunately revealed multiple acute lacunar infarcts.  Neurology consulted and recommended changing his outpatient aspirin  and Plavix  to aspirin  and Brilinta .   Brilinta  on hold over the past 24 hours due to drift in hemoglobin Continues on aspirin   #2 melena -onset last p.m. CT from earlier this admission did show esophageal wall thickening consistent with esophagitis. Suspect he is oozing in the setting of Brilinta  and aspirin , most likely this is upper and may be from acute esophagitis versus other mucosal lesion.  He has been hemodynamically stable Globin 6.8 this a.m.  #3 anemia acute on chronic-baseline hemoglobin in the 11 range from May 2025  #4 pleural effusions #5 acute kidney injury improved  PLAN: Continue IV PPI twice daily To be transfused 1 unit today, continue to trend hemoglobin twice daily and transfuse to keep his hemoglobin 7 Brilinta  on hold since yesterday Patient is a poor  candidate for sedation and endoscopy in setting of multiple acute CVAs.  Would prefer to avoid sedation and manage him conservatively unless he shows further evidence of ongoing active bleeding. Unfortunately with the new CVAs he really needs to be on Brilinta , if he remains stable over the next 24 hours maybe this can be resumed. GI will follow with you.   Amy Esterwood PA-C9/02/2024, 1:37 PM     Attending physician's note  I personally saw the patient and performed a substantive portion of the medical decision making process for this encounter (including a complete performance of the key components : MDM, Hx and Exam), in conjunction with the APP.  I agree with the APP's note, impression, and  the management plan for the number and complexity of problems addressed at the encounter for the patient and take responsibility for that plan with its inherent risk of complications, morbidity, or mortality with additional input as follows.    58 year old with multiple CVAs, left hemiplegia and aphasia with recurrent CVA on aspirin  and Plavix , currently on Brilinta  Decline in hemoglobin with dark stool  Patient is nonverbal, cannot communicate On exam abdomen soft, no  distention or tenderness  Currently Brilinta  is on hold  Recommend conservative management with IV PPI twice daily Avoid NSAIDs  Palliative care consult to discuss goals of care  Patient is at high risk for significant mortality related to anesthesia and procedures, higher risk compared to any potential benefit Transfuse PRBC as needed  Please call GI back if has signs of active GI hemorrhage and family wants aggressive intervention    The patient was provided an opportunity to ask questions and all were answered. The patient agreed with the plan and demonstrated an understanding of the instructions.  LOIS Wilkie Mcgee , MD [phone]

## 2024-01-17 NOTE — Progress Notes (Signed)
 Pt is on D7 of abx for UTI. Wbc down to wnl. Ok to stop after doses today per Dr. Davia.  Sergio Batch, PharmD, BCIDP, AAHIVP, CPP Infectious Disease Pharmacist 01/17/2024 10:06 AM

## 2024-01-17 NOTE — Evaluation (Signed)
 Occupational Therapy Evaluation Patient Details Name: Ryan Hughes MRN: [id number] DOB: March 17, 1966 Today's Date: 01/17/2024   History of Present Illness   58 yo presenting to [facility] from [facility] due to increasing AMS and sweating x 4 days. Pt hypotensive on arrival. Pt found with acute renal failure, hypernatremia, metabolic acidosis with normal anion gap, sepsis, acute metabolic encephalopathy. MRI (+) Acute lacunar infaracts involving L frontal, L posterior temporal/ parietal lobe and single acute lacunar infarct R frontal lobe. PMH includes CVA w/ L hemiplegia and aphasia, HTN, DMII.     Clinical Impressions Pt able to partially assist in bed mobility to get to EOB, needing mod A +2 to assist but pt assisting more in rolling L. Pt following 33% of commands throughout activity for reaching tasks and prompts while supine. He was non-verbal throughout session and using head gestures to occasionally communicate. Worked on his sitting balance with cues for correcting L lean but pt very fatigued. He declined working on feeding goals at this time. OT to continue following pt acutely to progress pt as able.       If plan is discharge home, recommend the following:   Two people to help with bathing/dressing/bathroom     Functional Status Assessment         Equipment Recommendations   Wheelchair (measurements OT);Wheelchair cushion (measurements OT);Hospital bed;Hoyer lift     Recommendations for Other Services         Precautions/Restrictions   Precautions Precautions: Fall Recall of Precautions/Restrictions: Impaired Precaution/Restrictions Comments: Contact; per neuro, SBP goal <160 Restrictions Weight Bearing Restrictions Per Provider Order: No RLE Weight Bearing Per Provider Order: Non weight bearing Other Position/Activity Restrictions: chronic AKA     Mobility Bed Mobility Overal bed mobility: Needs Assistance Bed Mobility: Rolling, Sidelying  to Sit Rolling: Mod assist, Used rails Sidelying to sit: +2 for safety/equipment, +2 for physical assistance, Mod assist   Sit to supine: +2 for physical assistance, Mod assist        Transfers                          Balance Overall balance assessment: Needs assistance Sitting-balance support: Bilateral upper extremity supported, Feet supported Sitting balance-Leahy Scale: Poor Sitting balance - Comments: L lean Postural control: Left lateral lean     Standing balance comment: defer                           ADL either performed or assessed with clinical judgement   ADL       Grooming: Maximal assistance;Wash/dry hands                                 General ADL Comments: attempted to have pt attempt self feeding but he declined, hand over hand asisst to initiate some washing of face with R hand.     Vision         Perception         Praxis         Pertinent Vitals/Pain Pain Assessment Pain Assessment: Faces Faces Pain Scale: Hurts little more Pain Location: with PROM of LUE/LLE Pain Descriptors / Indicators: Moaning Pain Intervention(s): Monitored during session, Limited activity within patient's tolerance, Repositioned     Extremity/Trunk Assessment Upper Extremity Assessment Upper Extremity Assessment: LUE deficits/detail;RUE deficits/detail RUE Deficits / Details: pt using  RUE to prop himself on EOB and assist with bed mobility, Grip strength intact. LUE Deficits / Details: spontaneously repositioned LUE, did not repeat on cue.           Communication Communication Communication: Impaired Factors Affecting Communication: Difficulty expressing self;Other (comment) (aphasic with hx CVA)   Cognition Arousal: Lethargic Behavior During Therapy: Flat affect               OT - Cognition Comments: Pt inconsistent with responses, will perform occasional moan but unable to make out any speech. Will nod or  shake his head slowly to yes/no questions. Following roughly 33% of commands throughout session                 Following commands: Impaired Following commands impaired: Follows one step commands inconsistently, Follows multi-step commands inconsistently, Follows one step commands with increased time     Cueing  General Comments   Cueing Techniques: Verbal cues  Bp sitting EOB 128/70s although pt using R arm so unsure of accuracy but it was higher earlier this AM   Exercises Other Exercises Other Exercises: Reaching with RUE seated EOB.   Shoulder Instructions      Home Living                                          Prior Functioning/Environment                      OT Problem List:     OT Treatment/Interventions:        OT Goals(Current goals can be found in the care plan section)   Acute Rehab OT Goals OT Goal Formulation: Patient unable to participate in goal setting Time For Goal Achievement: 01/26/24 Potential to Achieve Goals: Fair   OT Frequency:  Min 1X/week    Co-evaluation              AM-PAC OT 6 Clicks Daily Activity     Outcome Measure Help from another person eating meals?: A Lot Help from another person taking care of personal grooming?: A Lot Help from another person toileting, which includes using toliet, bedpan, or urinal?: Total Help from another person bathing (including washing, rinsing, drying)?: Total Help from another person to put on and taking off regular upper body clothing?: Total Help from another person to put on and taking off regular lower body clothing?: Total 6 Click Score: 8   End of Session Nurse Communication: Mobility status;Precautions;Weight bearing status;Need for lift equipment  Activity Tolerance: Patient limited by lethargy;Patient limited by fatigue Patient left: in bed;with call bell/phone within reach;with bed alarm set  OT Visit Diagnosis: Unsteadiness on feet  (R26.81);Muscle weakness (generalized) (M62.81)                Time: 8454-8392 OT Time Calculation (min): 22 min Charges:  OT General Charges $OT Visit: 1 Visit OT Treatments $Therapeutic Activity: 8-22 mins  01/17/2024  AB, OTR/L  Acute Rehabilitation Services  Office: [phone]   Curtistine JONETTA Das 01/17/2024, 5:27 PM

## 2024-01-17 NOTE — Progress Notes (Signed)
 [facility]                                                                              Ryan Hughes, is a 58 y.o. male, DOB - Jul 28, 1965, FMW:[id number] Admit date - 01/11/2024    Outpatient Primary MD for the patient is [name], MD  LOS - 6  days  Chief Complaint  Patient presents with   Altered Mental Status   Hypotension       Brief summary   Patient is a 57 year old male with  dementia, depression, CVA, HLD, type II DM, PVD with right AKA, wheelchair-bound, BPH with suprapubic catheter present to the hospital with complaints of altered mental status. Presented with confusion. Currently being treated for UTI, AKI, hypernatremia, and stroke.    Assessment & Plan     Acute kidney injury, uremia, normal anion gap metabolic acidosis  Acute hypernatremia -Presented with confusion/AMS superimposed on dementia - Workup showed serum creatinine of 4, sodium of 162, bicarb 19. -Cr has been improving with hydration, plateaued at 1.4, currently net +5.7 L -Unremarkable US  renal. - Suprapubic catheter appears to be functioning okay.   Sepsis secondary to UTI due to chronic indwelling catheter Chronic suprapubic catheter  BPH -History of chronic suprapubic catheter, changed in ED.   -Presented with hypotension, leukocytosis, tachypnea with potential source of infection in the urine. -Urine culture grew multiple organisms, currently on Unasyn   Acute on chronic normocytic anemia, UGIB - Baseline hemoglobin appears to be ~8 Hemoglobin this morning 6.8, was 6.5 on 9/8 -Ordered 1 unit packed RBCs. - Per RN staff, patient had black stools overnight, GI consulted, recommended to continue aspirin  for now.  Of note Brilinta  has been held already since 9/8 - FOBT positive    Intractable nausea and vomiting. Constipation 9/7 reported to have projectile vomiting.  Has minimal oral intake -X-ray abdomen shows evidence of concern for ileus versus small bowel  obstruction. -CT abdomen 9/8 showed no evidence of bowel obstruction.  Distal esophageal wall thickening suggesting chronic reflux/esophagitis.  Mild bladder wall thickening.   - GI has been consulted   Acute metabolic encephalopathy superimposed on dementia without behavioral disturbance  Unwitnessed fall Acute CVA with prior history of CVA -Metabolic workup showed normal ammonia, TSH, B1, B12, folic acid . CT head negative for any acute stroke. -MRI shows multiple acute lacunar stroke.  Neurology was consulted. - Was on aspirin  and Plavix .  Now on aspirin  and Brilinta  per neurology recommendation.  Carotid Doppler negative. -SLP PT OT following. -No significant improvement in mentation, very poor oral intake, needs goals of care, will consult palliative -Brilinta  now held due to UGIB, acute on chronic normocytic anemia   HTN Presents with hypotension BP stable, continue Coreg  .    Type 2 diabetes mellitus with vascular disease (HCC) On sliding scale insulin  and metformin  and Trulicity . For now continuing only sliding scale. CBG (last 3)  Recent Labs    01/16/24 2349 01/17/24 0439 01/17/24 0846  GLUCAP 165* 165* 160*      PAD, history of AKA, right Was on aspirin  Plavix .  Now on aspirin  and Brilinta .   -Brilinta  currently  on hold On 9/9 reports that he had left leg pain.  ABI pending   Concern for sleep apnea in the setting of encephalopathy No prior history of sleep apnea.  Likely apneic spell over in the setting of encephalopathy.   Elevated troponin Likely in the setting of poor clearance due to AKI rather than demand ischemia. No evidence of EKG evidence of ischemia. 2D echo shows EF 75%.  No valvular abnormality.  Moderate protein calorie malnutrition Nutrition Problem: Moderate Malnutrition Etiology: chronic illness (dementia) Signs/Symptoms: mild fat depletion, mild muscle depletion Interventions: Nepro shake  Obesity class I Estimated body mass index is  31.96 kg/m as calculated from the following:   Height as of this encounter: 5' 6 (1.676 m).   Weight as of this encounter: 89.8 kg.  Nonhealing sacral wound, stage III, POA - Wound care per nursing  Code Status: Full code DVT Prophylaxis:     Level of Care: Level of care: Progressive Family Communication: No family member at the bedside Disposition Plan:      Remains inpatient appropriate:      Procedures:    Consultants:     Antimicrobials:   Anti-infectives (From admission, onward)    Start     Dose/Rate Route Frequency Ordered Stop   01/13/24 1515  Ampicillin -Sulbactam (UNASYN ) 3 g in sodium chloride  0.9 % 100 mL IVPB        3 g 200 mL/hr over 30 Minutes Intravenous Every 6 hours 01/13/24 1430 01/17/24 2359   01/12/24 1200  piperacillin -tazobactam (ZOSYN ) IVPB 3.375 g  Status:  Discontinued        3.375 g 12.5 mL/hr over 240 Minutes Intravenous Every 8 hours 01/12/24 0737 01/13/24 1102   01/12/24 1000  linezolid  (ZYVOX ) IVPB 600 mg  Status:  Discontinued        600 mg 300 mL/hr over 60 Minutes Intravenous Every 12 hours 01/11/24 1659 01/12/24 1448   01/12/24 0600  piperacillin -tazobactam (ZOSYN ) IVPB 2.25 g  Status:  Discontinued        2.25 g 100 mL/hr over 30 Minutes Intravenous Every 8 hours 01/11/24 1746 01/12/24 0737   01/11/24 2200  linezolid  (ZYVOX ) IVPB 600 mg  Status:  Discontinued        600 mg 300 mL/hr over 60 Minutes Intravenous Every 12 hours 01/11/24 1659 01/11/24 1659   01/11/24 1415  ceFEPIme  (MAXIPIME ) 2 g in sodium chloride  0.9 % 100 mL IVPB        2 g 200 mL/hr over 30 Minutes Intravenous  Once 01/11/24 1402 01/11/24 1532   01/11/24 1415  metroNIDAZOLE  (FLAGYL ) IVPB 500 mg        500 mg 100 mL/hr over 60 Minutes Intravenous  Once 01/11/24 1402 01/11/24 1647   01/11/24 1415  vancomycin  (VANCOCIN ) IVPB 1000 mg/200 mL premix  Status:  Discontinued        1,000 mg 200 mL/hr over 60 Minutes Intravenous  Once 01/11/24 1402 01/11/24 1406   01/11/24  1415  vancomycin  (VANCOREADY) IVPB 1500 mg/300 mL        1,500 mg 150 mL/hr over 120 Minutes Intravenous  Once 01/11/24 1407 01/11/24 1854          Medications   stroke: early stages of recovery book   Does not apply Once   sodium chloride    Intravenous Once   aspirin  EC  81 mg Oral Daily   atorvastatin   80 mg Oral QHS   carvedilol   12.5 mg Oral BID WC   dutasteride   0.5 mg Oral Daily   feeding supplement (NEPRO CARB STEADY)  237 mL Oral TID BM   insulin  aspart  0-9 Units Subcutaneous Q4H   leptospermum manuka honey  1 Application Topical Daily   mupirocin  ointment  1 Application Nasal BID   pantoprazole  (PROTONIX ) IV  40 mg Intravenous Q12H   simethicone   80 mg Oral QID   tamsulosin   0.8 mg Oral Daily      Subjective:   Ryan Hughes was seen and examined today.  Flat affect, does not respond to any questions.  Per RN staff at the bedside, patient had black stools last night.  Complains of abdominal pain  Objective:   Vitals:   01/17/24 0850 01/17/24 0900 01/17/24 1133 01/17/24 1157  BP:   (!) 153/68 114/60  Pulse: 88  76 76  Resp:  19 20 11   Temp:   98.8 F (37.1 C) 98 F (36.7 C)  TempSrc:   Axillary Oral  SpO2:   98% 99%  Weight:      Height:        Intake/Output Summary (Last 24 hours) at 01/17/2024 1201 Last data filed at 01/17/2024 0900 Gross per 24 hour  Intake 2050.23 ml  Output 925 ml  Net 1125.23 ml     Wt Readings from Last 3 Encounters:  01/11/24 89.8 kg  10/02/23 90 kg  10/02/23 90 kg     Exam General: Awake alert, flat affect Cardiovascular: S1 S2 auscultated,  RRR Respiratory: Clear to auscultation bilaterally, no wheezing Gastrointestinal: Soft, diffuse TTP, ND, + bowel sounds Ext: no pedal edema bilaterally Neuro: Does not follow commands Psych: Flat affect    Data Reviewed:  I have personally reviewed following labs    CBC Lab Results  Component Value Date   WBC 10.0 01/17/2024   RBC 2.16 (L) 01/17/2024   HGB 6.8 (LL)  01/17/2024   HCT 20.9 (L) 01/17/2024   MCV 96.8 01/17/2024   MCH 31.5 01/17/2024   PLT 212 01/17/2024   MCHC 32.5 01/17/2024   RDW 14.6 01/17/2024   LYMPHSABS 2.4 01/15/2024   MONOABS 0.7 01/15/2024   EOSABS 0.2 01/15/2024   BASOSABS 0.0 01/15/2024     Last metabolic panel Lab Results  Component Value Date   NA 144 01/16/2024   K 3.5 01/16/2024   CL 113 (H) 01/16/2024   CO2 21 (L) 01/16/2024   BUN 31 (H) 01/16/2024   CREATININE 1.48 (H) 01/16/2024   GLUCOSE 200 (H) 01/16/2024   GFRNONAA 54 (L) 01/16/2024   GFRAA >60 03/17/2019   CALCIUM  7.8 (L) 01/16/2024   PHOS 2.5 01/13/2024   PROT 6.5 01/11/2024   ALBUMIN 3.2 (L) 01/11/2024   LABGLOB 2.3 12/08/2021   AGRATIO 2.2 12/08/2021   BILITOT 0.5 01/11/2024   ALKPHOS 74 01/11/2024   AST 19 01/11/2024   ALT 22 01/11/2024   ANIONGAP 10 01/16/2024    CBG (last 3)  Recent Labs    01/16/24 2349 01/17/24 0439 01/17/24 0846  GLUCAP 165* 165* 160*      Coagulation Profile: No results for input(s): INR, PROTIME in the last 168 hours.   Radiology Studies: I have personally reviewed the imaging studies  CT ABDOMEN PELVIS W CONTRAST Result Date: 01/15/2024 CLINICAL DATA:  Bowel obstruction suspected Intractable nausea and vomiting. EXAM: CT ABDOMEN AND PELVIS WITH CONTRAST TECHNIQUE: Multidetector CT imaging of the abdomen and pelvis was performed using the standard protocol following bolus administration of intravenous contrast. RADIATION DOSE REDUCTION: This exam was performed  according to the departmental dose-optimization program which includes automated exposure control, adjustment of the mA and/or kV according to patient size and/or use of iterative reconstruction technique. CONTRAST:  75mL OMNIPAQUE  IOHEXOL  350 MG/ML SOLN COMPARISON:  Same day radiographs. Abdominopelvic CT without contrast 12/15/2021 FINDINGS: Technical note: Despite efforts by the technologist and patient, mild motion artifact is present on today's  exam and could not be eliminated. This reduces exam sensitivity and specificity. Lower chest: New small bilateral pleural effusions with adjacent dependent airspace opacities, likely atelectasis. Distal esophageal wall thickening is again noted, similar to previous study. Hepatobiliary: The liver is normal in density without suspicious focal abnormality. The gallbladder is incompletely distended. No evidence of gallstones, gallbladder wall thickening or biliary dilatation. Pancreas: Unremarkable. No pancreatic ductal dilatation or surrounding inflammatory changes. Spleen: Normal in size without focal abnormality. Adrenals/Urinary Tract: Both adrenal glands appear normal. No evidence of urinary tract calculus, suspicious renal lesion or hydronephrosis. A suprapubic bladder catheter is in place. The bladder appears decompressed with possible mild wall thickening. Stomach/Bowel: No enteric contrast administered. As above, distal esophageal wall thickening, similar to previous study. The stomach appears unremarkable for its degree of distention. No evidence of bowel distension, wall thickening or surrounding inflammation. The appendix appears normal. Mildly prominent stool throughout the colon. Vascular/Lymphatic: There are no enlarged abdominal or pelvic lymph nodes. Aortic and branch vessel atherosclerosis with postsurgical changes in the right groin consistent with previous femoral bypass procedure. No evidence of aneurysm or acute large vessel occlusion. Reproductive: Moderate enlargement of the prostate gland, similar to previous study. Other: No evidence of abdominal wall mass or hernia. No ascites or pneumoperitoneum. Musculoskeletal: No acute or significant osseous findings. Mild multilevel spondylosis. IMPRESSION: 1. No acute findings or explanation for the patient's symptoms. No evidence of bowel obstruction. 2. New small bilateral pleural effusions with adjacent dependent airspace opacities, likely  atelectasis. 3. Distal esophageal wall thickening, similar to previous study, suggesting esophagitis or chronic reflux. Correlate clinically. 4. Suprapubic bladder catheter in place with possible mild bladder wall thickening. 5. Aortic Atherosclerosis (ICD10-I70.0). Interval postsurgical changes of the right groin consistent with femoral bypass procedure. Electronically Signed   By: Elsie Perone M.D.   On: 01/15/2024 16:09       Emslee Lopezmartinez M.D. [facility] 01/17/2024, 12:01 PM  Available via Epic secure chat 7am-7pm After 7 pm, please refer to night coverage provider listed on amion.

## 2024-01-17 NOTE — Progress Notes (Signed)
 [facility] night cross cover note:   I was notified by the patient's RN of pt's hgb of 6.8 this AM. This is compared to most recent prior hemoglobin level of 7.3 when checked yesterday morning.  Prior to that, hemoglobin was 6.5 on 9/8, which prompted transfusion of 1 unit PRBC.  Following this, he has required no additional blood product transfusion leading up to this morning.  Most recent vital signs appear stable, with heart rates in the 80s, systolic blood pressures in the 140s mmHg.   He is here with acute renal failure.   I confirmed that the patient has an active type and screen in place. I subsequently ordered transfusion of 1 unit PRBC to occur over 4 hours, along with order for repeat H&H to be checked after completion of transfusion of this 1 unit prbc.     Eva Pore, DO Hospitalist

## 2024-01-18 DIAGNOSIS — I6381 Other cerebral infarction due to occlusion or stenosis of small artery: Secondary | ICD-10-CM | POA: Diagnosis not present

## 2024-01-18 DIAGNOSIS — R630 Anorexia: Secondary | ICD-10-CM

## 2024-01-18 DIAGNOSIS — E861 Hypovolemia: Secondary | ICD-10-CM | POA: Diagnosis not present

## 2024-01-18 DIAGNOSIS — Z7189 Other specified counseling: Secondary | ICD-10-CM | POA: Diagnosis not present

## 2024-01-18 DIAGNOSIS — E870 Hyperosmolality and hypernatremia: Secondary | ICD-10-CM | POA: Diagnosis not present

## 2024-01-18 DIAGNOSIS — N179 Acute kidney failure, unspecified: Secondary | ICD-10-CM | POA: Diagnosis not present

## 2024-01-18 DIAGNOSIS — K921 Melena: Secondary | ICD-10-CM | POA: Diagnosis not present

## 2024-01-18 LAB — GLUCOSE, CAPILLARY
Glucose-Capillary: 67 mg/dL — ABNORMAL LOW (ref 70–99)
Glucose-Capillary: 79 mg/dL (ref 70–99)
Glucose-Capillary: 83 mg/dL (ref 70–99)
Glucose-Capillary: 88 mg/dL (ref 70–99)
Glucose-Capillary: 89 mg/dL (ref 70–99)
Glucose-Capillary: 96 mg/dL (ref 70–99)

## 2024-01-18 LAB — RENAL FUNCTION PANEL
Albumin: 1.9 g/dL — ABNORMAL LOW (ref 3.5–5.0)
Anion gap: 8 (ref 5–15)
BUN: 11 mg/dL (ref 6–20)
CO2: 22 mmol/L (ref 22–32)
Calcium: 7.8 mg/dL — ABNORMAL LOW (ref 8.9–10.3)
Chloride: 113 mmol/L — ABNORMAL HIGH (ref 98–111)
Creatinine, Ser: 1.07 mg/dL (ref 0.61–1.24)
GFR, Estimated: >60 mL/min (ref >60)
Glucose, Bld: 92 mg/dL (ref 70–99)
Phosphorus: 2.5 mg/dL (ref 2.5–4.6)
Potassium: 3 mmol/L — ABNORMAL LOW (ref 3.5–5.1)
Sodium: 143 mmol/L (ref 135–145)

## 2024-01-18 LAB — TYPE AND SCREEN
ABO/RH(D): AB POS
Antibody Screen: NEGATIVE
Unit division: 0
Unit division: 0

## 2024-01-18 LAB — BPAM RBC
Blood Product Expiration Date: 202509232359
Blood Product Expiration Date: 202510032359
ISSUE DATE / TIME: 202509100603
ISSUE DATE / TIME: 202509082307
Unit Type and Rh: 8400
Unit Type and Rh: 6200

## 2024-01-18 LAB — MAGNESIUM: Magnesium: 1.7 mg/dL (ref 1.7–2.4)

## 2024-01-18 LAB — CBC
HCT: 23.3 % — ABNORMAL LOW (ref 39.0–52.0)
Hemoglobin: 7.9 g/dL — ABNORMAL LOW (ref 13.0–17.0)
MCH: 31.6 pg (ref 26.0–34.0)
MCHC: 33.9 g/dL (ref 30.0–36.0)
MCV: 93.2 fL (ref 80.0–100.0)
Platelets: 248 K/uL (ref 150–400)
RBC: 2.5 MIL/uL — ABNORMAL LOW (ref 4.22–5.81)
RDW: 15 % (ref 11.5–15.5)
WBC: 7.3 K/uL (ref 4.0–10.5)
nRBC: 0 % (ref 0.0–0.2)

## 2024-01-18 MED ORDER — DEXTROSE 50 % IV SOLN
12.5000 g | INTRAVENOUS | Status: AC
  Administered 2024-01-18: 12.5 g via INTRAVENOUS

## 2024-01-18 MED ORDER — POTASSIUM CHLORIDE CRYS ER 20 MEQ PO TBCR
40.0000 meq | EXTENDED_RELEASE_TABLET | Freq: Once | ORAL | Status: DC
Start: 2024-01-18 — End: 2024-01-19
  Filled 2024-01-18: qty 2

## 2024-01-18 NOTE — Progress Notes (Signed)
 Palliative:  HPI: 58 y.o. male  with past medical history of dementia, CVA w/ L hemiplegia and aphasia, HLD, HTN, DM2, PVD with right AKA, BPH with suprapubic catheter admitted from [facility] on 01/11/2024 with increasing AMS and sweating. Pt found with acute renal failure, hypernatremia, metabolic acidosis with normal anion gap, sepsis secondary to UTI with suprapubic catheter, acute metabolic encephalopathy. MRI (+) Acute lacunar infaracts involving L frontal, L posterior temporal/ parietal lobe and single acute lacunar infarct R frontal lobe.   Updated by Dr. Davia. Extensive chart review completed. Discussed with RN at bedside. Ryan Hughes is lying in bed. He is awake and alert. Follows commands. Not verbal during my interaction but nods head yes/no. RN reports dark stool today with concern for ongoing GIB. RN reports poor intake with only ~5 sips of supplement.   I called and spoke with HCPOA niece Ryan Hughes. I introduced myself and we discussed concerns with GIB, risks of invasive work up, as well as risks of holding Brilinta . We discussed concern for ongoing GIB with anemia and dark stools. I discussed plans for conservative treatment at this time but if this continues we need to have an idea of the risks vs benefits of which direction to go - pursuing aggressive testing and procedures or more conservative approach recognizing his declining status. Ryan Hughes expresses understanding. She is not sure what she would want at this time but acknowledges the risks of procedures as well as his declining status despite best efforts during hospitalization. We also discussed his declining intake. Ryan Hughes reports poor intake but it seems that this has declined even more in the past couple days she has had to return to work. Ryan Hughes is overwhelmed with the complexity of her uncles condition and the decisions at hand. She plans to speak with her work to take more time off to be at bedside tomorrow so we can  continue conversations.   Ryan Hughes shares that she has had ongoing conversations with Dr. Tobie regarding code status. She is expresses that she does not want him to suffer but is struggling with officially agreeing to DNR status. I spent some time reassuring her that DNR would be appropriate and could prevent extreme suffering especially at end of life for her uncle. I explained that opting for DNR does not alter his prognosis as his prognosis is poor and outcome is expected to be poor regardless of providing resuscitative measures or not. I shared that we can offer him measures for comfort and relief of suffering at end of life as an alternative to resuscitation that we do not believe would be successful for Ryan Hughes. Ryan Hughes expresses understanding. Agrees to discussing further in person tomorrow.   All questions/concerns addressed. Emotional support provided. Updated Dr. Davia.   Exam: Alert, follows commands. Nods head appropriately. Not verbal during my visit. Breathing regular, unlabored. Abd soft, non-tender. Generalized weakness.   Plan: - Ongoing goals of care conversations with HCPOA. Plan to discuss in person tomorrow.   65 min  Bernarda Kitty, NP Palliative Medicine Team Pager [phone] (Please see [email or web address] for schedule) Team Phone [phone]

## 2024-01-18 NOTE — Progress Notes (Signed)
 [facility]                                                                              [name], is a 58 y.o. male, DOB - [birth year], FMW:[id number] Admit date - 01/11/2024    Outpatient Primary MD for the patient is [name], MD  LOS - 7  days  Chief Complaint  Patient presents with   Altered Mental Status   Hypotension       Brief summary   Patient is a 58 year old male with  dementia, depression, CVA, HLD, type II DM, PVD with right AKA, wheelchair-bound, BPH with suprapubic catheter present to the hospital with complaints of altered mental status. Presented with confusion. Currently being treated for UTI, AKI, hypernatremia, and stroke.    Assessment & Plan     Acute kidney injury, uremia, normal anion gap metabolic acidosis  Acute hypernatremia -Presented with confusion/AMS superimposed on dementia - Workup showed serum creatinine of 4, sodium of 162, bicarb 19. -Unremarkable US  renal. - Suprapubic catheter appears to be functioning okay. - Creatinine has improved, 1.0 at baseline   Sepsis secondary to UTI due to chronic indwelling catheter Chronic suprapubic catheter  BPH -History of chronic suprapubic catheter, changed in ED.   -Presented with hypotension, leukocytosis, tachypnea with potential source of infection in the urine. -Urine culture grew multiple organisms, completed IV Unasyn   Acute on chronic normocytic anemia, UGIB - Baseline hemoglobin appears to be ~8 -Hemoglobin 6.8 on 9/10, received 1 unit packed RBCs.  - Has been off of Brilinta  since 9/8, on aspirin .  Per RN, had black stools, FOBT positive.  GI was consulted.  -Per GI, transfuse as needed, high risk for significant mortality related to anesthesia and procedures, high risk compared to potential benefit and recommended palliative care to discuss goals of care.  IV PPI twice daily and to call back to GI if family wants aggressive intervention or active hemorrhage -  Due to new CVA, needs to be on Brilinta , per GI if H/H remains stable over the next 24 hours, can resume - Palliative medicine reconsulted  Intractable nausea and vomiting. Constipation Nutrition 9/7 reported to have projectile vomiting.  Has minimal oral intake -X-ray abdomen shows evidence of concern for ileus versus small bowel obstruction. -CT abdomen 9/8 showed no evidence of bowel obstruction.  Distal esophageal wall thickening suggesting chronic reflux/esophagitis.  Mild bladder wall thickening.   - Dietitian has been following and recommended palliative to discuss goals of care, initiating enteral nutrition to supplement as p.o. intake remains poor. - Palliative medicine reconsulted   Acute metabolic encephalopathy superimposed on dementia without behavioral disturbance  Unwitnessed fall Acute CVA with prior history of CVA -Metabolic workup showed normal ammonia, TSH, B1, B12, folic acid . CT head negative for any acute stroke. -MRI shows multiple acute lacunar stroke.  Neurology was consulted. - Was on aspirin  and Plavix .  Now on aspirin  and Brilinta  per neurology recommendation.  Carotid Doppler negative. -SLP PT OT following. -No significant improvement in mentation, very poor oral intake, needs goals of care -Brilinta  now held due to UGIB, acute on chronic normocytic anemia  HTN Presents with hypotension BP stable, continue Coreg  .    Type 2 diabetes mellitus with vascular disease (HCC) On sliding scale insulin  and metformin  and Trulicity . For now continuing only sliding scale. CBG (last 3)  Recent Labs    01/18/24 0348 01/18/24 0913 01/18/24 1253  GLUCAP 89 88 96      PAD, history of AKA, right Was on aspirin  Plavix .  Now on aspirin  and Brilinta .   -Brilinta  currently on hold On 9/9 reports that he had left leg pain.  ABI pending   Concern for sleep apnea in the setting of encephalopathy No prior history of sleep apnea.  Likely apneic spell over in the  setting of encephalopathy.   Elevated troponin Likely in the setting of poor clearance due to AKI rather than demand ischemia. No evidence of EKG evidence of ischemia. 2D echo shows EF 75%.  No valvular abnormality.  Moderate protein calorie malnutrition Nutrition Problem: Moderate Malnutrition Etiology: chronic illness (dementia) Signs/Symptoms: mild fat depletion, mild muscle depletion Interventions: Nepro shake - P.o. intake remains poor, palliative reconsulted for GOC  Obesity class I Estimated body mass index is 31.96 kg/m as calculated from the following:   Height as of this encounter: 5' 6 (1.676 m).   Weight as of this encounter: 89.8 kg.  Nonhealing sacral wound, stage III, POA - Wound care per nursing  Code Status: Full code DVT Prophylaxis:     Level of Care: Level of care: Progressive Family Communication: No family member at the bedside Disposition Plan:      Remains inpatient appropriate:      Procedures:    Consultants:     Antimicrobials:   Anti-infectives (From admission, onward)    Start     Dose/Rate Route Frequency Ordered Stop   01/13/24 1515  Ampicillin -Sulbactam (UNASYN ) 3 g in sodium chloride  0.9 % 100 mL IVPB        3 g 200 mL/hr over 30 Minutes Intravenous Every 6 hours 01/13/24 1430 01/17/24 2209   01/12/24 1200  piperacillin -tazobactam (ZOSYN ) IVPB 3.375 g  Status:  Discontinued        3.375 g 12.5 mL/hr over 240 Minutes Intravenous Every 8 hours 01/12/24 0737 01/13/24 1102   01/12/24 1000  linezolid  (ZYVOX ) IVPB 600 mg  Status:  Discontinued        600 mg 300 mL/hr over 60 Minutes Intravenous Every 12 hours 01/11/24 1659 01/12/24 1448   01/12/24 0600  piperacillin -tazobactam (ZOSYN ) IVPB 2.25 g  Status:  Discontinued        2.25 g 100 mL/hr over 30 Minutes Intravenous Every 8 hours 01/11/24 1746 01/12/24 0737   01/11/24 2200  linezolid  (ZYVOX ) IVPB 600 mg  Status:  Discontinued        600 mg 300 mL/hr over 60 Minutes Intravenous  Every 12 hours 01/11/24 1659 01/11/24 1659   01/11/24 1415  ceFEPIme  (MAXIPIME ) 2 g in sodium chloride  0.9 % 100 mL IVPB        2 g 200 mL/hr over 30 Minutes Intravenous  Once 01/11/24 1402 01/11/24 1532   01/11/24 1415  metroNIDAZOLE  (FLAGYL ) IVPB 500 mg        500 mg 100 mL/hr over 60 Minutes Intravenous  Once 01/11/24 1402 01/11/24 1647   01/11/24 1415  vancomycin  (VANCOCIN ) IVPB 1000 mg/200 mL premix  Status:  Discontinued        1,000 mg 200 mL/hr over 60 Minutes Intravenous  Once 01/11/24 1402 01/11/24 1406   01/11/24 1415  vancomycin  (  VANCOREADY) IVPB 1500 mg/300 mL        1,500 mg 150 mL/hr over 120 Minutes Intravenous  Once 01/11/24 1407 01/11/24 1854          Medications   stroke: early stages of recovery book   Does not apply Once   sodium chloride    Intravenous Once   aspirin  EC  81 mg Oral Daily   atorvastatin   80 mg Oral QHS   carvedilol   12.5 mg Oral BID WC   dutasteride   0.5 mg Oral Daily   feeding supplement (NEPRO CARB STEADY)  237 mL Oral TID BM   insulin  aspart  0-9 Units Subcutaneous Q4H   leptospermum manuka honey  1 Application Topical Daily   mupirocin  ointment  1 Application Nasal BID   pantoprazole  (PROTONIX ) IV  40 mg Intravenous Q12H   potassium chloride   40 mEq Oral Once   simethicone   80 mg Oral QID   tamsulosin   0.8 mg Oral Daily      Subjective:   Ryan Hughes was seen and examined today.  Alert and awake, flat affect, nonverbal, does not follow commands although he did nod yes to few questions.  Denied any pain, fever or chills, nausea or vomiting.  Objective:   Vitals:   01/18/24 0351 01/18/24 0800 01/18/24 1000 01/18/24 1200  BP: (!) 150/68 (!) 158/84 (!) 174/92 (!) 156/77  Pulse:  73 74 78  Resp: 15 16  20   Temp: 99 F (37.2 C) 97.8 F (36.6 C)  99 F (37.2 C)  TempSrc: Axillary Oral  Oral  SpO2: 99% 99%  99%  Weight:      Height:        Intake/Output Summary (Last 24 hours) at 01/18/2024 1410 Last data filed at  01/18/2024 0700 Gross per 24 hour  Intake --  Output 900 ml  Net -900 ml     Wt Readings from Last 3 Encounters:  01/11/24 89.8 kg  10/02/23 90 kg  10/02/23 90 kg   Physical Exam General: Alert and awake, not following commands Cardiovascular: S1 S2 clear, RRR.  Respiratory: CTAB, no wheezing Gastrointestinal: Soft, nontender, nondistended, NBS Ext: no pedal edema bilaterally Neuro: Does not follow commands Psych: flat affect    Data Reviewed:  I have personally reviewed following labs    CBC Lab Results  Component Value Date   WBC 7.3 01/18/2024   RBC 2.50 (L) 01/18/2024   HGB 7.9 (L) 01/18/2024   HCT 23.3 (L) 01/18/2024   MCV 93.2 01/18/2024   MCH 31.6 01/18/2024   PLT 248 01/18/2024   MCHC 33.9 01/18/2024   RDW 15.0 01/18/2024   LYMPHSABS 2.4 01/15/2024   MONOABS 0.7 01/15/2024   EOSABS 0.2 01/15/2024   BASOSABS 0.0 01/15/2024     Last metabolic panel Lab Results  Component Value Date   NA 143 01/18/2024   K 3.0 (L) 01/18/2024   CL 113 (H) 01/18/2024   CO2 22 01/18/2024   BUN 11 01/18/2024   CREATININE 1.07 01/18/2024   GLUCOSE 92 01/18/2024   GFRNONAA >60 01/18/2024   GFRAA >60 03/17/2019   CALCIUM  7.8 (L) 01/18/2024   PHOS 2.5 01/18/2024   PROT 6.5 01/11/2024   ALBUMIN 1.9 (L) 01/18/2024   LABGLOB 2.3 12/08/2021   AGRATIO 2.2 12/08/2021   BILITOT 0.5 01/11/2024   ALKPHOS 74 01/11/2024   AST 19 01/11/2024   ALT 22 01/11/2024   ANIONGAP 8 01/18/2024    CBG (last 3)  Recent Labs  01/18/24 0348 01/18/24 0913 01/18/24 1253  GLUCAP 89 88 96      Coagulation Profile: No results for input(s): INR, PROTIME in the last 168 hours.   Radiology Studies: I have personally reviewed the imaging studies  VAS US  ABI WITH/WO TBI Result Date: 01/17/2024  LOWER EXTREMITY DOPPLER STUDY Patient Name:  Devine Klingel  Date of Exam:   01/17/2024 Medical Rec #: [id number]      Accession #:    [id number] Date of Birth: Jun 05, 1965      Patient Gender:  M Patient Age:   33 years Exam Location:  [facility] Procedure:      VAS US  ABI WITH/WO TBI Referring Phys: PRANAV PATEL --------------------------------------------------------------------------------  Indications: Peripheral artery disease. right AKA High Risk Factors: Hypertension, Diabetes.  Limitations: Today's exam was limited due to patient positioning and bandages. Comparison Study: Previous study on 3.27.2024. Performing Technologist: Edilia Elden Appl  Examination Guidelines: A complete evaluation includes at minimum, Doppler waveform signals and systolic blood pressure reading at the level of bilateral brachial, anterior tibial, and posterior tibial arteries, when vessel segments are accessible. Bilateral testing is considered an integral part of a complete examination. Photoelectric Plethysmograph (PPG) waveforms and toe systolic pressure readings are included as required and additional duplex testing as needed. Limited examinations for reoccurring indications may be performed as noted.  ABI Findings: +---------+------------------+-----+---------+--------+ Right    Rt Pressure (mmHg)IndexWaveform Comment  +---------+------------------+-----+---------+--------+ Brachial 157                    triphasic         +---------+------------------+-----+---------+--------+ PTA                                      AKA      +---------+------------------+-----+---------+--------+ DP                                       AKA      +---------+------------------+-----+---------+--------+ Great Toe                                AKA      +---------+------------------+-----+---------+--------+ +---------+------------------+-----+-------------------+---------------+ Left     Lt Pressure (mmHg)IndexWaveform           Comment         +---------+------------------+-----+-------------------+---------------+ Brachial                                           IV line.         +---------+------------------+-----+-------------------+---------------+ PTA      254               1.62 dampened monophasicNoncompressible +---------+------------------+-----+-------------------+---------------+ DP       254               1.62 dampened monophasicNoncompressible +---------+------------------+-----+-------------------+---------------+ Great Toe0                 0.00 Absent                             +---------+------------------+-----+-------------------+---------------+ +-------+-----------+-----------+------------+------------+ ABI/TBIToday's ABIToday's TBIPrevious ABIPrevious TBI +-------+-----------+-----------+------------+------------+ Right  AKA  AKA                                 +-------+-----------+-----------+------------+------------+ Left   Lasara         Absent.                             +-------+-----------+-----------+------------+------------+ Arterial wall calcification precludes accurate ankle pressures and ABIs.  Summary: Right:  Right AKA. Left: Resting left ankle-brachial index indicates noncompressible left lower extremity arteries. The left toe-brachial index is abnormal.  *See table(s) above for measurements and observations.  Electronically signed by Lonni Gaskins MD on 01/17/2024 at 4:07:57 PM.    Final        Nydia Distance M.D. [facility] 01/18/2024, 2:10 PM  Available via Epic secure chat 7am-7pm After 7 pm, please refer to night coverage provider listed on amion.

## 2024-01-18 NOTE — TOC Progression Note (Signed)
 Transition of Care [facility]) - Progression Note    Patient Details  Name: Ryan Hughes MRN: [id number] Date of Birth: May 07, 1966  [facility] CM/SW Contact  Montie LOISE Louder, [place] Phone Number: 01/18/2024, 11:35 AM  Clinical Narrative:     TOC continuing to follow   Expected Discharge Plan: Skilled Nursing Facility Barriers to Discharge: Continued Medical Work up               Expected Discharge Plan and Services     Post Acute Care Choice: Skilled Nursing Facility Living arrangements for the past 2 months: Skilled Nursing Facility                                       Social Drivers of Health (SDOH) Interventions SDOH Screenings   Food Insecurity: Patient Unable To Answer (01/11/2024)  Housing: Unknown (01/11/2024)  Transportation Needs: Patient Unable To Answer (01/11/2024)  Utilities: Patient Unable To Answer (01/11/2024)  Depression (PHQ2-9): High Risk (12/08/2021)  Tobacco Use: Medium Risk (01/11/2024)    Readmission Risk Interventions     No data to display

## 2024-01-18 NOTE — Plan of Care (Signed)
  Problem: Education: Goal: Ability to describe self-care measures that may prevent or decrease complications (Diabetes Survival Skills Education) will improve Outcome: Not Progressing   Problem: Coping: Goal: Ability to adjust to condition or change in health will improve Outcome: Not Progressing   Problem: Fluid Volume: Goal: Ability to maintain a balanced intake and output will improve Outcome: Not Progressing   Problem: Health Behavior/Discharge Planning: Goal: Ability to identify and utilize available resources and services will improve Outcome: Not Progressing Goal: Ability to manage health-related needs will improve Outcome: Not Progressing   Problem: Nutritional: Goal: Maintenance of adequate nutrition will improve Outcome: Not Progressing Goal: Progress toward achieving an optimal weight will improve Outcome: Not Progressing   Problem: Skin Integrity: Goal: Risk for impaired skin integrity will decrease Outcome: Not Progressing   Problem: Tissue Perfusion: Goal: Adequacy of tissue perfusion will improve Outcome: Not Progressing   Problem: Education: Goal: Knowledge of General Education information will improve Description: Including pain rating scale, medication(s)/side effects and non-pharmacologic comfort measures Outcome: Not Progressing   Problem: Health Behavior/Discharge Planning: Goal: Ability to manage health-related needs will improve Outcome: Not Progressing   Problem: Clinical Measurements: Goal: Ability to maintain clinical measurements within normal limits will improve Outcome: Not Progressing   Problem: Activity: Goal: Risk for activity intolerance will decrease Outcome: Not Progressing   Problem: Nutrition: Goal: Adequate nutrition will be maintained Outcome: Not Progressing   Problem: Elimination: Goal: Will not experience complications related to bowel motility Outcome: Not Progressing   Problem: Skin Integrity: Goal: Risk for  impaired skin integrity will decrease Outcome: Not Progressing   Problem: Education: Goal: Knowledge of disease or condition will improve Outcome: Not Progressing Goal: Knowledge of secondary prevention will improve (MUST DOCUMENT ALL) Outcome: Not Progressing Goal: Knowledge of patient specific risk factors will improve (DELETE if not current risk factor) Outcome: Not Progressing   Problem: Coping: Goal: Will verbalize positive feelings about self Outcome: Not Progressing Goal: Will identify appropriate support needs Outcome: Not Progressing   Problem: Health Behavior/Discharge Planning: Goal: Ability to manage health-related needs will improve Outcome: Not Progressing Goal: Goals will be collaboratively established with patient/family Outcome: Not Progressing   Problem: Self-Care: Goal: Ability to participate in self-care as condition permits will improve Outcome: Not Progressing Goal: Verbalization of feelings and concerns over difficulty with self-care will improve Outcome: Not Progressing Goal: Ability to communicate needs accurately will improve Outcome: Not Progressing   Problem: Nutrition: Goal: Risk of aspiration will decrease Outcome: Not Progressing Goal: Dietary intake will improve Outcome: Not Progressing

## 2024-01-18 NOTE — Progress Notes (Signed)
 Calorie Count Note  48 hour calorie count ordered. Pt's intake during calorie count remains poor. Pt only able to meet 22-59% calorie needs and 14-57% protein needs PO. Pt's intake has been consistently poor and suspect he has not been meeting his needs throughout entire admission (7 days). Recommend palliative consult to discuss GOC and if nutrition support aligns with GOC. Recommend initiating enteral nutrition to supplement calories and protein while PO intake remains poor.   Diet: DYS 2, nectar thick Supplements: Nepro, Mighty Shake  Estimated Nutritional Needs:  Kcal:  1500-1700 Protein:  70-85g Fluid:  1.5-1.7L  Day 1: Breakfast: 15% eggs, 25% potatoes, 50% milk, 100% applesauce (167 kcal, 6 g protein) Lunch: 25% roast w/ gravy, 25% Mighty Shake, 50% Nepro (343 kcal, 16 g protein) Dinner: 20% (165 kcal, 8 g protein) Supplements: 50% Nepro (210 kcal, 10 g protein)  Total intake: 885 kcal (59% of minimum estimated needs)  40 protein (57% of minimum estimated needs)   Day 2: Breakfast: 100% OJ, applesauce (120 kcal, 0 g protein) Lunch: 0% Dinner: unknown, no documentation or ticket Supplements: 50% Nepro (210 kcal, 10 g protein)  Total intake: 330 kcal (22% of minimum estimated needs)  10 protein (14% of minimum estimated needs)  Nutrition Dx:  Moderate Malnutrition related to chronic illness (dementia) as evidenced by mild fat depletion, mild muscle depletion.   Goal:  Patient will meet greater than or equal to 90% of their needs   Intervention:  Recommend palliative consult to discuss GOC and if enteral nutrition support aligns with GOC; would recommend initiating enteral nutrition to supplement calorie and protein while PO intake remains poor.  Nepro Shake po TID, each supplement provides 425 kcal and 19 grams protein  Mighty Shake TID with meals, each supplement provides 330 kcals and 9 grams of protein  Encouraged adequate intake of DYS 2 diet to meet calorie and  protein needs    Josette Glance, MS, RDN, LDN Clinical Dietitian I Please reach out via secure chat

## 2024-01-19 DIAGNOSIS — E870 Hyperosmolality and hypernatremia: Secondary | ICD-10-CM | POA: Diagnosis not present

## 2024-01-19 DIAGNOSIS — N179 Acute kidney failure, unspecified: Secondary | ICD-10-CM | POA: Diagnosis not present

## 2024-01-19 DIAGNOSIS — E46 Unspecified protein-calorie malnutrition: Secondary | ICD-10-CM | POA: Diagnosis not present

## 2024-01-19 DIAGNOSIS — I6381 Other cerebral infarction due to occlusion or stenosis of small artery: Secondary | ICD-10-CM | POA: Diagnosis not present

## 2024-01-19 DIAGNOSIS — Z7189 Other specified counseling: Secondary | ICD-10-CM | POA: Diagnosis not present

## 2024-01-19 DIAGNOSIS — Z515 Encounter for palliative care: Secondary | ICD-10-CM | POA: Diagnosis not present

## 2024-01-19 DIAGNOSIS — Z9189 Other specified personal risk factors, not elsewhere classified: Secondary | ICD-10-CM | POA: Diagnosis not present

## 2024-01-19 DIAGNOSIS — E861 Hypovolemia: Secondary | ICD-10-CM | POA: Diagnosis not present

## 2024-01-19 LAB — RENAL FUNCTION PANEL
Albumin: 2.1 g/dL — ABNORMAL LOW (ref 3.5–5.0)
Anion gap: 10 (ref 5–15)
BUN: 9 mg/dL (ref 6–20)
CO2: 20 mmol/L — ABNORMAL LOW (ref 22–32)
Calcium: 7.9 mg/dL — ABNORMAL LOW (ref 8.9–10.3)
Chloride: 111 mmol/L (ref 98–111)
Creatinine, Ser: 1.04 mg/dL (ref 0.61–1.24)
GFR, Estimated: >60 mL/min (ref >60)
Glucose, Bld: 80 mg/dL (ref 70–99)
Phosphorus: 2.4 mg/dL — ABNORMAL LOW (ref 2.5–4.6)
Potassium: 3.1 mmol/L — ABNORMAL LOW (ref 3.5–5.1)
Sodium: 141 mmol/L (ref 135–145)

## 2024-01-19 LAB — GLUCOSE, CAPILLARY
Glucose-Capillary: 85 mg/dL (ref 70–99)
Glucose-Capillary: 76 mg/dL (ref 70–99)
Glucose-Capillary: 115 mg/dL — ABNORMAL HIGH (ref 70–99)
Glucose-Capillary: 108 mg/dL — ABNORMAL HIGH (ref 70–99)
Glucose-Capillary: 121 mg/dL — ABNORMAL HIGH (ref 70–99)

## 2024-01-19 LAB — CBC
HCT: 25.3 % — ABNORMAL LOW (ref 39.0–52.0)
Hemoglobin: 8.4 g/dL — ABNORMAL LOW (ref 13.0–17.0)
MCH: 31.5 pg (ref 26.0–34.0)
MCHC: 33.2 g/dL (ref 30.0–36.0)
MCV: 94.8 fL (ref 80.0–100.0)
Platelets: 281 K/uL (ref 150–400)
RBC: 2.67 MIL/uL — ABNORMAL LOW (ref 4.22–5.81)
RDW: 14.9 % (ref 11.5–15.5)
WBC: 6.8 K/uL (ref 4.0–10.5)
nRBC: 0 % (ref 0.0–0.2)

## 2024-01-19 MED ORDER — HYDRALAZINE HCL 20 MG/ML IJ SOLN
10.0000 mg | Freq: Four times a day (QID) | INTRAMUSCULAR | Status: DC | PRN
  Administered 2024-01-19 – 2024-01-25 (×3): 10 mg via INTRAVENOUS
  Filled 2024-01-19 (×4): qty 1

## 2024-01-19 MED ORDER — AMLODIPINE BESYLATE 5 MG PO TABS
5.0000 mg | ORAL_TABLET | Freq: Every day | ORAL | Status: DC
Start: 2024-01-19 — End: 2024-02-06
  Administered 2024-01-21 – 2024-02-04 (×10): 5 mg via ORAL
  Filled 2024-01-19 (×19): qty 1

## 2024-01-19 MED ORDER — SUCRALFATE 1 GM/10ML PO SUSP
1.0000 g | Freq: Three times a day (TID) | ORAL | Status: DC
  Administered 2024-01-19 – 2024-02-04 (×27): 1 g via ORAL
  Filled 2024-01-19 (×57): qty 10

## 2024-01-19 MED ORDER — POTASSIUM CHLORIDE 10 MEQ/100ML IV SOLN
10.0000 meq | INTRAVENOUS | Status: AC
  Administered 2024-01-19 (×3): 10 meq via INTRAVENOUS
  Filled 2024-01-19 (×3): qty 100

## 2024-01-19 MED ORDER — HYDRALAZINE HCL 20 MG/ML IJ SOLN: 20.0000 mg | Freq: Four times a day (QID) | INTRAMUSCULAR | Status: DC | PRN

## 2024-01-19 NOTE — Progress Notes (Signed)
 Physical Therapy Treatment Patient Details Name: Ryan Hughes MRN: [id number] DOB: [birth year] Today's Date: 01/19/2024   History of Present Illness 58 y/o presenting to [facility] from [facility] due to increasing AMS and sweating x 4 days. Pt hypotensive on arrival. Pt found with acute renal failure, hypernatremia, metabolic acidosis with normal anion gap, sepsis, acute metabolic encephalopathy. MRI (+) Acute lacunar infaracts involving L frontal, L posterior temporal/ parietal lobe and single acute lacunar infarct R frontal lobe. PMH includes CVA w/ L hemiplegia and aphasia, HTN, DMII.    PT Comments  Pt received in supine, c/o abdominal discomfort and lying somewhat perpendicular in air bed with HOB >30 deg, pt agreeable to repositioning for comfort with pt able to assist with partial roll toward his R side for placement of wedge due to L pushing at rest. Pt nods yes when asked if he is more comfortable after LUE elevated and pt repositioned for comfort with lines readjusted to prevent pressure on his skin, however SBP remains elevated slightly above goal per neuro MD (SBP <160 goal) so defer EOB/OOB until BP at a safer level, RN notified and PTA entered VS into flowsheet. Other vitals signs WFL on RA. Continue to recommend return to long term care facility upon DC, will continue to follow acutely as pt remains below functional baseline and has not been OOB to chair yet due to fatigue/elevated BP past couple sessions.     If plan is discharge home, recommend the following: Two people to help with walking and/or transfers;Assist for transportation;Assistance with cooking/housework;Supervision due to cognitive status   Can travel by private vehicle     No  Equipment Recommendations  None recommended by PT    Recommendations for Other Services       Precautions / Restrictions Precautions Precautions: Fall Recall of Precautions/Restrictions: Impaired Precaution/Restrictions  Comments: Contact; per neuro, SBP goal <160 Restrictions Weight Bearing Restrictions Per Provider Order: No Other Position/Activity Restrictions: chronic AKA     Mobility  Bed Mobility Overal bed mobility: Needs Assistance Bed Mobility: Rolling Rolling: Max assist, Used rails         General bed mobility comments: Defer EOB/OOB due to SBP elevated above goal, just rolling toward pt R side with pt using RUE on bed rail to assist, while PTA placing wedge pillow behind his L hemi-body as pt tending to push consistently toward his L and with HOB upright, he tends to slowly slide trunk to L and hips toward R EOB which could be unsafe for him in air bed. After wedge placed and lines adjusted, LUE wrist BP re-checked but remains elevated (but slightly improved), pt reports he is more comfortable and RN notified of pt BP above goal, defer further activity.    Transfers                   General transfer comment: UTA, BP too hypertensive see above    Ambulation/Gait                   Stairs             Wheelchair Mobility     Tilt Bed    Modified Rankin (Stroke Patients Only) Modified Rankin (Stroke Patients Only) Pre-Morbid Rankin Score: Severe disability Modified Rankin: Severe disability     Balance Overall balance assessment: Needs assistance     Sitting balance - Comments: L lean in high fowlers position in air bed, wedge pillow placed to prevent pushing to  L side                                    Communication Communication Communication: Impaired Factors Affecting Communication: Difficulty expressing self;Other (comment) (aphasic with hx CVA)  Cognition Arousal: Alert Behavior During Therapy: Flat affect   PT - Cognitive impairments: History of cognitive impairments, Orientation, Sequencing Difficult to assess due to: Impaired communication Orientation impairments: Time, Situation                   PT - Cognition  Comments: Pt verbalizes single or two word responses to orientation questions, able to state [place] but not month/year or situation, pt reoriented to situation. Pt using RUE to assist with repositioning when cued in bed; defer EOB/OOB transfers due to SBP elevated above neuro MD goal before/after repositioning pt for comfort, RN notified. Following commands: Impaired Following commands impaired: Follows one step commands inconsistently, Follows multi-step commands inconsistently, Follows one step commands with increased time    Cueing Cueing Techniques: Verbal cues, Gestural cues  Exercises Other Exercises Other Exercises: Reaching with RUE in high fowlers position in bed, pt consistently follows cues for R shoulder flexion and pulling trunk toward R bed rail ~3 reps, and pulling trunk forward to place small pillow behind his head/neck/shoulders x1 rep    General Comments General comments (skin integrity, edema, etc.): LUE slightly elevated for comfort/to reduce risk of increased edema, washcloth placed under pt fingers to allow better spacing and air flow and more comfortable angle, wedge pillow under L HB to prevent pushing to L side while HOB elevated, LLE Prevalon boot intact; see BP in comments above; RN notified pt verbalizes Sprite when asked what he wants to eat or drink, family member [name] notified who was in room in case she is comfortable feeding him, she did not appear receptive, RN notified in case family would like caregiver instruction by nursing staff on assisting him to eat/drink while she visits.      Pertinent Vitals/Pain Pain Assessment Pain Assessment: PAINAD Breathing: occasional labored breathing, short period of hyperventilation Negative Vocalization: none Facial Expression: sad, frightened, frown Body Language: relaxed Consolability: distracted or reassured by voice/touch PAINAD Score: 3 Pain Location: with PROM of LUE/LLE and pt pointing to abdomen when asked  where he is hurting Pain Descriptors / Indicators: Discomfort Pain Intervention(s): Limited activity within patient's tolerance, Monitored during session, Repositioned, Patient requesting pain meds-RN notified    Home Living                          Prior Function            PT Goals (current goals can now be found in the care plan section) Acute Rehab PT Goals Patient Stated Goal: To return to SNF per family PT Goal Formulation: Patient unable to participate in goal setting Time For Goal Achievement: 01/26/24 Progress towards PT goals: Progressing toward goals (slowly)    Frequency    Min 1X/week      PT Plan      Co-evaluation              AM-PAC PT 6 Clicks Mobility   Outcome Measure  Help needed turning from your back to your side while in a flat bed without using bedrails?: Total (would need +2 for EOB and hoyer for OOB) Help needed moving from lying on  your back to sitting on the side of a flat bed without using bedrails?: Total Help needed moving to and from a bed to a chair (including a wheelchair)?: Total Help needed standing up from a chair using your arms (e.g., wheelchair or bedside chair)?: Total Help needed to walk in hospital room?: Total Help needed climbing 3-5 steps with a railing? : Total 6 Click Score: 6    End of Session Equipment Utilized During Treatment: Other (comment) (bed pads, wedge pillow) Activity Tolerance: Patient tolerated treatment well;Treatment limited secondary to medical complications (Comment);Other (comment) (SBP above goal after pt repositioned for comfort due to c/o pain initially, RN notified and session ended for pt safety) Patient left: in bed;with call bell/phone within reach;with bed alarm set;with family/visitor present;Other (comment) (niece in room) Nurse Communication: Mobility status;Need for lift equipment;Precautions;Patient requests pain meds;Other (comment) (SBP elevated above goal; pt request for  thickened Sprite) PT Visit Diagnosis: Other abnormalities of gait and mobility (R26.89)     Time: 8956-8944 PT Time Calculation (min) (ACUTE ONLY): 12 min  Charges:    $Therapeutic Activity: 8-22 mins PT General Charges $$ ACUTE PT VISIT: 1 Visit                     Enrica Corliss P., PTA Acute Rehabilitation Services Secure Chat Preferred 9a-5:30pm Office: [phone]    Connell HERO [facility] 01/19/2024, 11:24 AM

## 2024-01-19 NOTE — Progress Notes (Signed)
 Speech Language Pathology Treatment: Dysphagia  Patient Details Name: Ryan Hughes MRN: [id number] DOB: Oct 23, 1965 Today's Date: 01/19/2024 Time: 8785-8767 SLP Time Calculation (min) (ACUTE ONLY): 18 min  Assessment / Plan / Recommendation Clinical Impression  Mr. Herrman continues to wince every time he swallows. He pointed to his stomach again today when asked if he was in pain. He was complaining of stomach pain during my last visit on 9/10. D/W RN.  Intake remains poor.  He continues to cough when drinking trials of thin liquids (water ); nectar thick liquids are better tolerated with minimal coughing.   Niece was at the bedside and we discussed poor PO intake and his decreased desire to eat.  Will f/u with MD re: ongoing stomach discomfort.   HPI HPI: 58 y.o. male presented from SNF with AMS. Dx sepsis secondary to UTI with chronic suprapubic catheter, ARF, hypernatremia, uremia. MRI 9/5 pm showed (+) Acute lacunar infaracts involving L frontal, L posterior temporal/ parietal lobe and single acute lacunar infarct R frontal lobe.  Recent poor PO intake and dehydration per notes. PMHx dementia, prior CVA, depression, HLD, PVD with right AKA. Prior MBS 12/17/21- regular diet, overall weakness of pharyngeal constriction, tongue propulsion, premature spillage. No aspiration. ST f/u for diet tolerance/potential progression and speech/language cognitive assessment.      SLP Plan  Continue with current plan of care          Recommendations  Diet recommendations: Dysphagia 2 (fine chop);Nectar-thick liquid Liquids provided via: Cup;Straw Medication Administration: Whole meds with puree Supervision: Staff to assist with self feeding;Full supervision/cueing for compensatory strategies Compensations: Slow rate;Small sips/bites;Minimize environmental distractions;Monitor for anterior loss;Follow solids with liquid Postural Changes and/or Swallow Maneuvers: Seated upright 90 degrees                   Oral care BID   Frequent or constant Supervision/Assistance Dysphagia, unspecified (R13.10)     Continue with current plan of care    Kynleigh Artz L. Vona, MA CCC/SLP Clinical Specialist - Acute Care SLP Acute Rehabilitation Services Office number [phone]  Vona Palma Laurice  01/19/2024, 1:00 PM

## 2024-01-19 NOTE — Progress Notes (Signed)
 Noted patient with elevated BP with no prn medication for blood pressure. On-call physician notified and orders given.

## 2024-01-19 NOTE — Discharge Planning (Signed)
 Can not complete admissions due to patient being confused andunable to answer questions

## 2024-01-19 NOTE — Progress Notes (Signed)
 Lab technician has advised me that the patient has refused his morning labs.

## 2024-01-19 NOTE — Progress Notes (Signed)
 Palliative:  HPI: 58 y.o. male  with past medical history of dementia, CVA w/ L hemiplegia and aphasia, HLD, HTN, DM2, PVD with right AKA, BPH with suprapubic catheter admitted from [facility] on 01/11/2024 with increasing AMS and sweating. Pt found with acute renal failure, hypernatremia, metabolic acidosis with normal anion gap, sepsis secondary to UTI with suprapubic catheter, acute metabolic encephalopathy. MRI (+) Acute lacunar infaracts involving L frontal, L posterior temporal/ parietal lobe and single acute lacunar infarct R frontal lobe. I   I went to visit with Ryan Hughes. He is sleeping and I do not awaken. Intake remains poor. No family at bedside.   I called to speak with HCPOA Ryan Hughes. Ryan Hughes has been at bedside since I last rounded on Ryan Hughes but has to leave soon. We reviewed his progress. Ryan Hughes shares that she and her husband have been able to get him to tolerate a little intake but still much less than she could get him to take even a few days ago. Ryan Hughes is hopeful to avoid endoscopy - she does not report a hard stop no to pursue. She agrees with starting back Brilinta  and seeing how he tolerates. If he does not tolerate she may consider endoscopy - will need further discussion.   Ryan Hughes continues to acknowledge that he is not doing well and not thriving. She is also struggling with the thought and guilt of living with not doing everything to help Ryan Hughes to live longer. She continues to think about DNR status but not ready to put in place at this time. She does state that she does not want him to suffer. I do believe that she will consider more risks vs limitations if he declines further and if he exhibits signs of suffering, whereas now he is fairly content without distress or clear signs of suffering.   All questions/concerns addressed. Emotional support provided.   Exam: Sleeping. No distress. Breathing regular, unlabored. Abd soft, flat. Generalized weakness  and fatigue.   Plan: - Ongoing goals of care conversations and support to Ryan Hughes.  - Time for progress and outcomes.   35 min  Bernarda Kitty, NP Palliative Medicine Team Pager [phone] (Please see [email or web address] for schedule) Team Phone [phone]

## 2024-01-19 NOTE — Progress Notes (Signed)
 [facility]                                                                              [name], is a 58 y.o. male, DOB - 06-24-1965, FMW:[id number] Admit date - 01/11/2024    Outpatient Primary MD for the patient is [name], MD  LOS - 8  days  Chief Complaint  Patient presents with   Altered Mental Status   Hypotension       Brief summary   Patient is a 58 year old male with  dementia, depression, CVA, HLD, type II DM, PVD with right AKA, wheelchair-bound, BPH with suprapubic catheter present to the hospital with complaints of altered mental status. Presented with confusion. Currently being treated for UTI, AKI, hypernatremia, and stroke.    Assessment & Plan     Acute kidney injury, uremia, normal anion gap metabolic acidosis  Acute hypernatremia -Presented with confusion/AMS superimposed on dementia - Workup showed serum creatinine of 4, sodium of 162, bicarb 19. - Unremarkable US  renal. - Suprapubic catheter appears to be functioning okay. - Creatinine has improved, 1.0 at baseline   Sepsis secondary to UTI due to chronic indwelling catheter Chronic suprapubic catheter  BPH -History of chronic suprapubic catheter, changed in ED.   -Presented with hypotension, leukocytosis, tachypnea with potential source of infection in the urine. -Urine culture grew multiple organisms, completed IV Unasyn   Acute on chronic normocytic anemia, UGIB - Baseline hemoglobin appears to be ~8 -Hemoglobin 6.8 on 9/10, received 1 unit packed RBCs.  - Has been off of Brilinta  since 9/8, on aspirin .  Per RN, had black stools, FOBT positive.  GI was consulted.  -Per GI, transfuse as needed, high risk for significant mortality related to anesthesia and procedures, high risk compared to potential benefit and recommended palliative care to discuss goals of care.  IV PPI twice daily and to call back to GI if family wants aggressive intervention or active hemorrhage.   Per GI, due to new CVA, needs to be on Brilinta , if H/H remains stable over the next 24 hours, can resume - Palliative medicine reconsulted, appreciate recommendations - If H&H stable tomorrow, will resume Brilinta  if no bleeding or aggressive intervention desired from the family  Intractable nausea and vomiting. Constipation Nutrition/poor p.o. intake, advanced dementia, nonverbal 9/7 reported to have projectile vomiting.  Has minimal oral intake -X-ray abdomen shows evidence of concern for ileus versus small bowel obstruction. -CT abdomen 9/8 showed no evidence of bowel obstruction.  Distal esophageal wall thickening suggesting chronic reflux/esophagitis.  Mild bladder wall thickening.   - Dietitian has been following and recommended palliative to discuss goals of care, initiating enteral nutrition to supplement as p.o. intake remains poor. - Palliative medicine following   Acute metabolic encephalopathy superimposed on dementia without behavioral disturbance  Unwitnessed fall Acute CVA with prior history of CVA -Metabolic workup showed normal ammonia, TSH, B1, B12, folic acid . CT head negative for any acute stroke. -MRI shows multiple acute lacunar stroke.  Neurology was consulted. - Was on aspirin  and Plavix .  Now on aspirin  and Brilinta  per neurology recommendation.  Carotid Doppler negative. -SLP PT OT following. -  No significant improvement in mentation, very poor oral intake, needs goals of care -Brilinta  now held due to UGIB, acute on chronic normocytic anemia  Essential hypertension - Out of window for permissive hypertension for stroke, resume antihypertensives, continue Coreg  12.5 mg twice daily, started amlodipine  5 mg daily - Also on losartan  50 mg daily outpatient, will resume gradually  Hypokalemia - K3.1, replaced IV   HTN Presents with hypotension BP stable, continue Coreg  .    Type 2 diabetes mellitus with vascular disease (HCC) On sliding scale insulin  and  metformin  and Trulicity . For now continuing only sliding scale. CBG (last 3)  Recent Labs    01/18/24 2354 01/19/24 0356 01/19/24 0824  GLUCAP 83 85 76      PAD, history of AKA, right Was on aspirin  Plavix .  Now on aspirin  and Brilinta .   -Brilinta  currently on hold On 9/9 reports that he had left leg pain.  ABI pending   Concern for sleep apnea in the setting of encephalopathy No prior history of sleep apnea.  Likely apneic spell over in the setting of encephalopathy.   Elevated troponin Likely in the setting of poor clearance due to AKI rather than demand ischemia. No evidence of EKG evidence of ischemia. 2D echo shows EF 75%.  No valvular abnormality.  Moderate protein calorie malnutrition Nutrition Problem: Moderate Malnutrition Etiology: chronic illness (dementia) Signs/Symptoms: mild fat depletion, mild muscle depletion Interventions: Nepro shake - P.o. intake remains poor, palliative reconsulted for GOC  Obesity class I Estimated body mass index is 31.96 kg/m as calculated from the following:   Height as of this encounter: 5' 6 (1.676 m).   Weight as of this encounter: 89.8 kg.  Nonhealing sacral wound, stage III, POA - Wound care per nursing  Code Status: Full code DVT Prophylaxis:     Level of Care: Level of care: Progressive Family Communication: No family member at the bedside Disposition Plan:      Remains inpatient appropriate:      Procedures:    Consultants:     Antimicrobials:   Anti-infectives (From admission, onward)    Start     Dose/Rate Route Frequency Ordered Stop   01/13/24 1515  Ampicillin -Sulbactam (UNASYN ) 3 g in sodium chloride  0.9 % 100 mL IVPB        3 g 200 mL/hr over 30 Minutes Intravenous Every 6 hours 01/13/24 1430 01/17/24 2209   01/12/24 1200  piperacillin -tazobactam (ZOSYN ) IVPB 3.375 g  Status:  Discontinued        3.375 g 12.5 mL/hr over 240 Minutes Intravenous Every 8 hours 01/12/24 0737 01/13/24 1102    01/12/24 1000  linezolid  (ZYVOX ) IVPB 600 mg  Status:  Discontinued        600 mg 300 mL/hr over 60 Minutes Intravenous Every 12 hours 01/11/24 1659 01/12/24 1448   01/12/24 0600  piperacillin -tazobactam (ZOSYN ) IVPB 2.25 g  Status:  Discontinued        2.25 g 100 mL/hr over 30 Minutes Intravenous Every 8 hours 01/11/24 1746 01/12/24 0737   01/11/24 2200  linezolid  (ZYVOX ) IVPB 600 mg  Status:  Discontinued        600 mg 300 mL/hr over 60 Minutes Intravenous Every 12 hours 01/11/24 1659 01/11/24 1659   01/11/24 1415  ceFEPIme  (MAXIPIME ) 2 g in sodium chloride  0.9 % 100 mL IVPB        2 g 200 mL/hr over 30 Minutes Intravenous  Once 01/11/24 1402 01/11/24 1532   01/11/24 1415  metroNIDAZOLE  (  FLAGYL ) IVPB 500 mg        500 mg 100 mL/hr over 60 Minutes Intravenous  Once 01/11/24 1402 01/11/24 1647   01/11/24 1415  vancomycin  (VANCOCIN ) IVPB 1000 mg/200 mL premix  Status:  Discontinued        1,000 mg 200 mL/hr over 60 Minutes Intravenous  Once 01/11/24 1402 01/11/24 1406   01/11/24 1415  vancomycin  (VANCOREADY) IVPB 1500 mg/300 mL        1,500 mg 150 mL/hr over 120 Minutes Intravenous  Once 01/11/24 1407 01/11/24 1854          Medications   stroke: early stages of recovery book   Does not apply Once   sodium chloride    Intravenous Once   amLODipine   5 mg Oral Daily   aspirin  EC  81 mg Oral Daily   atorvastatin   80 mg Oral QHS   carvedilol   12.5 mg Oral BID WC   dutasteride   0.5 mg Oral Daily   feeding supplement (NEPRO CARB STEADY)  237 mL Oral TID BM   insulin  aspart  0-9 Units Subcutaneous Q4H   leptospermum manuka honey  1 Application Topical Daily   mupirocin  ointment  1 Application Nasal BID   pantoprazole  (PROTONIX ) IV  40 mg Intravenous Q12H   potassium chloride   40 mEq Oral Once   simethicone   80 mg Oral QID   tamsulosin   0.8 mg Oral Daily      Subjective:   Ryan Hughes was seen and examined today.  Awake, nonverbal, does not follow commands.  Denies any pain.     Objective:   Vitals:   01/19/24 0310 01/19/24 0830 01/19/24 1044 01/19/24 1048  BP: (!) 175/75 (!) 143/88 (!) 172/88 (!) 165/83  Pulse: 93 79    Resp: 20 20    Temp: 98.2 F (36.8 C) 98.6 F (37 C)    TempSrc: Oral Oral    SpO2: 98% 98%    Weight:      Height:        Intake/Output Summary (Last 24 hours) at 01/19/2024 1145 Last data filed at 01/19/2024 9166 Gross per 24 hour  Intake 0 ml  Output 1325 ml  Net -1325 ml     Wt Readings from Last 3 Encounters:  01/11/24 89.8 kg  10/02/23 90 kg  10/02/23 90 kg   Physical Exam General: Awake, dementia, nonverbal, not following commands Cardiovascular: S1 S2 clear, RRR.  Respiratory: CTAB, no wheezing, Gastrointestinal: Soft, nontender, nondistended, NBS Ext: no pedal edema bilaterally Neuro: Not following commands Psych: Dementia   Data Reviewed:  I have personally reviewed following labs    CBC Lab Results  Component Value Date   WBC 6.8 01/19/2024   RBC 2.67 (L) 01/19/2024   HGB 8.4 (L) 01/19/2024   HCT 25.3 (L) 01/19/2024   MCV 94.8 01/19/2024   MCH 31.5 01/19/2024   PLT 281 01/19/2024   MCHC 33.2 01/19/2024   RDW 14.9 01/19/2024   LYMPHSABS 2.4 01/15/2024   MONOABS 0.7 01/15/2024   EOSABS 0.2 01/15/2024   BASOSABS 0.0 01/15/2024     Last metabolic panel Lab Results  Component Value Date   NA 141 01/19/2024   K 3.1 (L) 01/19/2024   CL 111 01/19/2024   CO2 20 (L) 01/19/2024   BUN 9 01/19/2024   CREATININE 1.04 01/19/2024   GLUCOSE 80 01/19/2024   GFRNONAA >60 01/19/2024   GFRAA >60 03/17/2019   CALCIUM  7.9 (L) 01/19/2024   PHOS 2.4 (L) 01/19/2024  PROT 6.5 01/11/2024   ALBUMIN 2.1 (L) 01/19/2024   LABGLOB 2.3 12/08/2021   AGRATIO 2.2 12/08/2021   BILITOT 0.5 01/11/2024   ALKPHOS 74 01/11/2024   AST 19 01/11/2024   ALT 22 01/11/2024   ANIONGAP 10 01/19/2024    CBG (last 3)  Recent Labs    01/18/24 2354 01/19/24 0356 01/19/24 0824  GLUCAP 83 85 76      Coagulation  Profile: No results for input(s): INR, PROTIME in the last 168 hours.   Radiology Studies: I have personally reviewed the imaging studies  VAS US  ABI WITH/WO TBI Result Date: 01/17/2024  LOWER EXTREMITY DOPPLER STUDY Patient Name:  Ryan Hughes  Date of Exam:   01/17/2024 Medical Rec #: [id number]      Accession #:    [id number] Date of Birth: 06/01/1965      Patient Gender: M Patient Age:   36 years Exam Location:  [facility] Procedure:      VAS US  ABI WITH/WO TBI Referring Phys: PRANAV PATEL --------------------------------------------------------------------------------  Indications: Peripheral artery disease. right AKA High Risk Factors: Hypertension, Diabetes.  Limitations: Today's exam was limited due to patient positioning and bandages. Comparison Study: Previous study on 3.27.2024. Performing Technologist: Edilia Elden Appl  Examination Guidelines: A complete evaluation includes at minimum, Doppler waveform signals and systolic blood pressure reading at the level of bilateral brachial, anterior tibial, and posterior tibial arteries, when vessel segments are accessible. Bilateral testing is considered an integral part of a complete examination. Photoelectric Plethysmograph (PPG) waveforms and toe systolic pressure readings are included as required and additional duplex testing as needed. Limited examinations for reoccurring indications may be performed as noted.  ABI Findings: +---------+------------------+-----+---------+--------+ Right    Rt Pressure (mmHg)IndexWaveform Comment  +---------+------------------+-----+---------+--------+ Brachial 157                    triphasic         +---------+------------------+-----+---------+--------+ PTA                                      AKA      +---------+------------------+-----+---------+--------+ DP                                       AKA      +---------+------------------+-----+---------+--------+ Great Toe                                 AKA      +---------+------------------+-----+---------+--------+ +---------+------------------+-----+-------------------+---------------+ Left     Lt Pressure (mmHg)IndexWaveform           Comment         +---------+------------------+-----+-------------------+---------------+ Brachial                                           IV line.        +---------+------------------+-----+-------------------+---------------+ PTA      254               1.62 dampened monophasicNoncompressible +---------+------------------+-----+-------------------+---------------+ DP       254               1.62 dampened monophasicNoncompressible +---------+------------------+-----+-------------------+---------------+ Burnetta Heck  0.00 Absent                             +---------+------------------+-----+-------------------+---------------+ +-------+-----------+-----------+------------+------------+ ABI/TBIToday's ABIToday's TBIPrevious ABIPrevious TBI +-------+-----------+-----------+------------+------------+ Right  AKA        AKA                                 +-------+-----------+-----------+------------+------------+ Left   Aquadale         Absent.                             +-------+-----------+-----------+------------+------------+ Arterial wall calcification precludes accurate ankle pressures and ABIs.  Summary: Right:  Right AKA. Left: Resting left ankle-brachial index indicates noncompressible left lower extremity arteries. The left toe-brachial index is abnormal.  *See table(s) above for measurements and observations.  Electronically signed by Lonni Gaskins MD on 01/17/2024 at 4:07:57 PM.    Final        Nydia Distance M.D. [facility] 01/19/2024, 11:45 AM  Available via Epic secure chat 7am-7pm After 7 pm, please refer to night coverage provider listed on amion.

## 2024-01-19 NOTE — Plan of Care (Signed)
  RN reported that patient blood pressure is elevated 185/100.  Otherwise hemodynamically stable. Currently on Coreg .  Resumed amlodipine  and continue IV hydralazine  as needed.   Patient is out of sepsis now and 24-hrs window of permissive hypertension for acute CVA.

## 2024-01-20 DIAGNOSIS — N179 Acute kidney failure, unspecified: Secondary | ICD-10-CM | POA: Diagnosis not present

## 2024-01-20 DIAGNOSIS — E861 Hypovolemia: Secondary | ICD-10-CM | POA: Diagnosis not present

## 2024-01-20 DIAGNOSIS — E870 Hyperosmolality and hypernatremia: Secondary | ICD-10-CM | POA: Diagnosis not present

## 2024-01-20 DIAGNOSIS — I6381 Other cerebral infarction due to occlusion or stenosis of small artery: Secondary | ICD-10-CM | POA: Diagnosis not present

## 2024-01-20 LAB — RENAL FUNCTION PANEL
Albumin: 2.1 g/dL — ABNORMAL LOW (ref 3.5–5.0)
Anion gap: 8 (ref 5–15)
BUN: 11 mg/dL (ref 6–20)
CO2: 21 mmol/L — ABNORMAL LOW (ref 22–32)
Calcium: 7.8 mg/dL — ABNORMAL LOW (ref 8.9–10.3)
Chloride: 110 mmol/L (ref 98–111)
Creatinine, Ser: 1.13 mg/dL (ref 0.61–1.24)
GFR, Estimated: >60 mL/min (ref >60)
Glucose, Bld: 111 mg/dL — ABNORMAL HIGH (ref 70–99)
Phosphorus: 2.1 mg/dL — ABNORMAL LOW (ref 2.5–4.6)
Potassium: 3.4 mmol/L — ABNORMAL LOW (ref 3.5–5.1)
Sodium: 139 mmol/L (ref 135–145)

## 2024-01-20 LAB — CBC
HCT: 23.5 % — ABNORMAL LOW (ref 39.0–52.0)
Hemoglobin: 7.9 g/dL — ABNORMAL LOW (ref 13.0–17.0)
MCH: 31.2 pg (ref 26.0–34.0)
MCHC: 33.6 g/dL (ref 30.0–36.0)
MCV: 92.9 fL (ref 80.0–100.0)
Platelets: 274 K/uL (ref 150–400)
RBC: 2.53 MIL/uL — ABNORMAL LOW (ref 4.22–5.81)
RDW: 14.8 % (ref 11.5–15.5)
WBC: 6.8 K/uL (ref 4.0–10.5)
nRBC: 0 % (ref 0.0–0.2)

## 2024-01-20 LAB — GLUCOSE, CAPILLARY
Glucose-Capillary: 108 mg/dL — ABNORMAL HIGH (ref 70–99)
Glucose-Capillary: 107 mg/dL — ABNORMAL HIGH (ref 70–99)
Glucose-Capillary: 121 mg/dL — ABNORMAL HIGH (ref 70–99)
Glucose-Capillary: 108 mg/dL — ABNORMAL HIGH (ref 70–99)
Glucose-Capillary: 94 mg/dL (ref 70–99)
Glucose-Capillary: 117 mg/dL — ABNORMAL HIGH (ref 70–99)

## 2024-01-20 MED ORDER — POTASSIUM PHOSPHATES 15 MMOLE/5ML IV SOLN
30.0000 mmol | Freq: Once | INTRAVENOUS | Status: AC
  Administered 2024-01-20: 30 mmol via INTRAVENOUS
  Filled 2024-01-20: qty 10

## 2024-01-20 MED ORDER — TICAGRELOR 90 MG PO TABS
90.0000 mg | ORAL_TABLET | Freq: Two times a day (BID) | ORAL | Status: DC
Start: 2024-01-20 — End: 2024-02-06
  Administered 2024-01-21 – 2024-02-04 (×20): 90 mg via ORAL
  Filled 2024-01-20 (×32): qty 1

## 2024-01-20 NOTE — Progress Notes (Addendum)
 [facility]                                                                              Ryan Hughes, is a 58 y.o. male, DOB - 1965-08-24, FMW:[id number] Admit date - 01/11/2024    Outpatient Primary MD for the patient is [name], MD  LOS - 9  days  Chief Complaint  Patient presents with   Altered Mental Status   Hypotension       Brief summary   Patient is a 58 year old male with  dementia, depression, CVA, HLD, type II DM, PVD with right AKA, wheelchair-bound, BPH with suprapubic catheter present to the hospital with complaints of altered mental status. Presented with confusion. Currently being treated for UTI, AKI, hypernatremia, and stroke.    Assessment & Plan     Acute kidney injury, uremia, normal anion gap metabolic acidosis  Acute hypernatremia -Presented with confusion/AMS superimposed on dementia - Workup showed serum creatinine of 4, sodium of 162, bicarb 19. - Unremarkable US  renal. - Suprapubic catheter appears to be functioning okay. - Creatinine has improved, 1.0 at baseline   Sepsis secondary to UTI due to chronic indwelling catheter Chronic suprapubic catheter  BPH -History of chronic suprapubic catheter, changed in ED.   -Presented with hypotension, leukocytosis, tachypnea with potential source of infection in the urine. -Urine culture grew multiple organisms, completed IV Unasyn   Acute on chronic normocytic anemia, UGIB - Baseline hemoglobin appears to be ~8 -Hemoglobin 6.8 on 9/10, received 1 unit packed RBCs.  - Has been off of Brilinta  since 9/8, on aspirin .  Per RN, had black stools, FOBT positive.  GI was consulted.  -Per GI, transfuse as needed, high risk for significant mortality related to anesthesia and procedures, high risk compared to potential benefit and recommended palliative care to discuss goals of care.  IV PPI twice daily and to call back to GI if family wants aggressive intervention or active hemorrhage.   Per GI, due to new CVA, needs to be on Brilinta , if H/H remains stable over the next 24 hours, can resume - Per palliative, patient's niece, [name] wants Brilinta  to resume and if he starts bleeding then proceed with GI intervention - Unfortunately patient has been refusing medications, food, will need core track/PEG versus comfort care   Intractable nausea and vomiting, abdominal discomfort. Constipation Nutrition/poor p.o. intake, advanced dementia, nonverbal 9/7 reported to have projectile vomiting.  Has minimal oral intake -X-ray abdomen shows evidence of concern for ileus versus small bowel obstruction. -CT abdomen 9/8 showed no evidence of bowel obstruction.  Distal esophageal wall thickening suggesting chronic reflux/esophagitis.  Mild bladder wall thickening.   - Dietitian has been following and recommended palliative to discuss goals of care, initiating enteral nutrition to supplement as p.o. intake remains poor. - Palliative medicine following, will need decision from the family regarding code trach/PEG versus comfort care.   Acute metabolic encephalopathy superimposed on dementia without behavioral disturbance  Unwitnessed fall Acute CVA with prior history of CVA -Metabolic workup showed normal ammonia, TSH, B1, B12, folic acid . CT head negative for any acute stroke. -MRI shows multiple acute lacunar stroke.  Neurology was consulted. - Was on aspirin  and Plavix .  Now on aspirin  and Brilinta  per neurology recommendation.  Carotid Doppler negative. -SLP PT OT following. -No significant improvement in mentation, very poor oral intake,  - Will resume Brilinta , follow H&H  Essential hypertension - Out of window for permissive hypertension for stroke, resume antihypertensives, continue Coreg  12.5 mg twice daily, started amlodipine  5 mg daily - Also on losartan  50 mg daily outpatient, will resume gradually  Hypokalemia, hypophosphatemia - K-Phos IV x 1   HTN Presents with  hypotension BP stable, continue Coreg  .    Type 2 diabetes mellitus with vascular disease (HCC) On sliding scale insulin  and metformin  and Trulicity . For now continuing only sliding scale. CBG (last 3)  Recent Labs    01/20/24 0232 01/20/24 0431 01/20/24 1128  GLUCAP 108* 107* 121*      PAD, history of AKA, right Was on aspirin  Plavix .  Now on aspirin  and Brilinta .   -Brilinta  currently on hold On 9/9 reports that he had left leg pain.  ABI pending   Concern for sleep apnea in the setting of encephalopathy No prior history of sleep apnea.  Likely apneic spell over in the setting of encephalopathy.   Elevated troponin Likely in the setting of poor clearance due to AKI rather than demand ischemia. No evidence of EKG evidence of ischemia. 2D echo shows EF 75%.  No valvular abnormality.  Moderate protein calorie malnutrition Nutrition Problem: Moderate Malnutrition Etiology: chronic illness (dementia) Signs/Symptoms: mild fat depletion, mild muscle depletion Interventions: Nepro shake - P.o. intake remains poor, palliative reconsulted for GOC  Obesity class I Estimated body mass index is 31.96 kg/m as calculated from the following:   Height as of this encounter: 5' 6 (1.676 m).   Weight as of this encounter: 89.8 kg.  Nonhealing sacral wound, stage III, POA - Wound care per nursing  Code Status: Full code DVT Prophylaxis:     Level of Care: Level of care: Progressive Family Communication: No family member at the bedside Disposition Plan:      Remains inpatient appropriate:      Procedures:    Consultants:     Antimicrobials:   Anti-infectives (From admission, onward)    Start     Dose/Rate Route Frequency Ordered Stop   01/13/24 1515  Ampicillin -Sulbactam (UNASYN ) 3 g in sodium chloride  0.9 % 100 mL IVPB        3 g 200 mL/hr over 30 Minutes Intravenous Every 6 hours 01/13/24 1430 01/17/24 2209   01/12/24 1200  piperacillin -tazobactam (ZOSYN ) IVPB  3.375 g  Status:  Discontinued        3.375 g 12.5 mL/hr over 240 Minutes Intravenous Every 8 hours 01/12/24 0737 01/13/24 1102   01/12/24 1000  linezolid  (ZYVOX ) IVPB 600 mg  Status:  Discontinued        600 mg 300 mL/hr over 60 Minutes Intravenous Every 12 hours 01/11/24 1659 01/12/24 1448   01/12/24 0600  piperacillin -tazobactam (ZOSYN ) IVPB 2.25 g  Status:  Discontinued        2.25 g 100 mL/hr over 30 Minutes Intravenous Every 8 hours 01/11/24 1746 01/12/24 0737   01/11/24 2200  linezolid  (ZYVOX ) IVPB 600 mg  Status:  Discontinued        600 mg 300 mL/hr over 60 Minutes Intravenous Every 12 hours 01/11/24 1659 01/11/24 1659   01/11/24 1415  ceFEPIme  (MAXIPIME ) 2 g in sodium chloride  0.9 % 100 mL IVPB  2 g 200 mL/hr over 30 Minutes Intravenous  Once 01/11/24 1402 01/11/24 1532   01/11/24 1415  metroNIDAZOLE  (FLAGYL ) IVPB 500 mg        500 mg 100 mL/hr over 60 Minutes Intravenous  Once 01/11/24 1402 01/11/24 1647   01/11/24 1415  vancomycin  (VANCOCIN ) IVPB 1000 mg/200 mL premix  Status:  Discontinued        1,000 mg 200 mL/hr over 60 Minutes Intravenous  Once 01/11/24 1402 01/11/24 1406   01/11/24 1415  vancomycin  (VANCOREADY) IVPB 1500 mg/300 mL        1,500 mg 150 mL/hr over 120 Minutes Intravenous  Once 01/11/24 1407 01/11/24 1854          Medications   stroke: early stages of recovery book   Does not apply Once   sodium chloride    Intravenous Once   amLODipine   5 mg Oral Daily   aspirin  EC  81 mg Oral Daily   atorvastatin   80 mg Oral QHS   carvedilol   12.5 mg Oral BID WC   dutasteride   0.5 mg Oral Daily   feeding supplement (NEPRO CARB STEADY)  237 mL Oral TID BM   insulin  aspart  0-9 Units Subcutaneous Q4H   leptospermum manuka honey  1 Application Topical Daily   mupirocin  ointment  1 Application Nasal BID   pantoprazole  (PROTONIX ) IV  40 mg Intravenous Q12H   simethicone   80 mg Oral QID   sucralfate   1 g Oral TID WC & HS   tamsulosin   0.8 mg Oral Daily       Subjective:   Ryan Hughes was seen and examined today.  Nonverbal, difficult to obtain ROS from the patient.  Per RN, refusing meds, food   Objective:   Vitals:   01/20/24 0031 01/20/24 0420 01/20/24 0828 01/20/24 1123  BP: (!) 149/74 (!) 153/71 (!) 155/82 98/63  Pulse: 75 73 73 72  Resp: 20 20 20 19   Temp: 97.7 F (36.5 C) 98.7 F (37.1 C) 98.7 F (37.1 C) 98.1 F (36.7 C)  TempSrc: Axillary Axillary Oral Axillary  SpO2: 96%  98% 100%  Weight:      Height:        Intake/Output Summary (Last 24 hours) at 01/20/2024 1217 Last data filed at 01/19/2024 1800 Gross per 24 hour  Intake 505.49 ml  Output --  Net 505.49 ml     Wt Readings from Last 3 Encounters:  01/11/24 89.8 kg  10/02/23 90 kg  10/02/23 90 kg    Physical Exam General: Alert, nonverbal, advanced dementia, NAD Cardiovascular: S1 S2 clear, RRR.  Respiratory: CTAB, no wheezing, rales or rhonchi Gastrointestinal: Soft, nontender, nondistended, NBS Ext: no pedal edema bilaterally Neuro: Not following commands Skin: No rashes Psych: Dementia   Data Reviewed:  I have personally reviewed following labs    CBC Lab Results  Component Value Date   WBC 6.8 01/20/2024   RBC 2.53 (L) 01/20/2024   HGB 7.9 (L) 01/20/2024   HCT 23.5 (L) 01/20/2024   MCV 92.9 01/20/2024   MCH 31.2 01/20/2024   PLT 274 01/20/2024   MCHC 33.6 01/20/2024   RDW 14.8 01/20/2024   LYMPHSABS 2.4 01/15/2024   MONOABS 0.7 01/15/2024   EOSABS 0.2 01/15/2024   BASOSABS 0.0 01/15/2024     Last metabolic panel Lab Results  Component Value Date   NA 139 01/20/2024   K 3.4 (L) 01/20/2024   CL 110 01/20/2024   CO2 21 (L) 01/20/2024   BUN  11 01/20/2024   CREATININE 1.13 01/20/2024   GLUCOSE 111 (H) 01/20/2024   GFRNONAA >60 01/20/2024   GFRAA >60 03/17/2019   CALCIUM  7.8 (L) 01/20/2024   PHOS 2.1 (L) 01/20/2024   PROT 6.5 01/11/2024   ALBUMIN 2.1 (L) 01/20/2024   LABGLOB 2.3 12/08/2021   AGRATIO 2.2 12/08/2021    BILITOT 0.5 01/11/2024   ALKPHOS 74 01/11/2024   AST 19 01/11/2024   ALT 22 01/11/2024   ANIONGAP 8 01/20/2024    CBG (last 3)  Recent Labs    01/20/24 0232 01/20/24 0431 01/20/24 1128  GLUCAP 108* 107* 121*      Coagulation Profile: No results for input(s): INR, PROTIME in the last 168 hours.   Radiology Studies: I have personally reviewed the imaging studies  No results found.      Nydia Distance M.D. [facility] 01/20/2024, 12:17 PM  Available via Epic secure chat 7am-7pm After 7 pm, please refer to night coverage provider listed on amion.

## 2024-01-20 NOTE — Plan of Care (Signed)
 Problem: Education: Goal: Ability to describe self-care measures that may prevent or decrease complications (Diabetes Survival Skills Education) will improve 01/20/2024 1738 by Ewing Novel I, RN Outcome: Progressing 01/20/2024 1736 by Ewing Novel I, RN Outcome: Progressing Goal: Individualized Educational Video(s) 01/20/2024 1738 by Ewing Novel I, RN Outcome: Progressing 01/20/2024 1736 by Ewing Novel I, RN Outcome: Progressing   Problem: Coping: Goal: Ability to adjust to condition or change in health will improve 01/20/2024 1738 by Ewing Novel I, RN Outcome: Progressing 01/20/2024 1736 by Ewing Novel I, RN Outcome: Progressing   Problem: Fluid Volume: Goal: Ability to maintain a balanced intake and output will improve 01/20/2024 1738 by Ewing Novel I, RN Outcome: Progressing 01/20/2024 1736 by Ewing Novel I, RN Outcome: Progressing   Problem: Health Behavior/Discharge Planning: Goal: Ability to identify and utilize available resources and services will improve 01/20/2024 1738 by Ewing Novel I, RN Outcome: Progressing 01/20/2024 1736 by Ewing Novel I, RN Outcome: Progressing Goal: Ability to manage health-related needs will improve 01/20/2024 1738 by Ewing Novel I, RN Outcome: Progressing 01/20/2024 1736 by Ewing Novel I, RN Outcome: Progressing   Problem: Metabolic: Goal: Ability to maintain appropriate glucose levels will improve 01/20/2024 1738 by Ewing Novel I, RN Outcome: Progressing 01/20/2024 1736 by Ewing Novel I, RN Outcome: Progressing   Problem: Nutritional: Goal: Maintenance of adequate nutrition will improve 01/20/2024 1738 by Ewing Novel I, RN Outcome: Progressing 01/20/2024 1736 by Ewing Novel I, RN Outcome: Progressing Goal: Progress toward achieving an optimal weight will improve 01/20/2024 1738 by Ewing Novel I, RN Outcome: Progressing 01/20/2024 1736 by Ewing Novel I, RN Outcome: Progressing   Problem:  Skin Integrity: Goal: Risk for impaired skin integrity will decrease 01/20/2024 1738 by Ewing Novel I, RN Outcome: Progressing 01/20/2024 1736 by Ewing Novel I, RN Outcome: Progressing   Problem: Tissue Perfusion: Goal: Adequacy of tissue perfusion will improve 01/20/2024 1738 by Ewing Novel I, RN Outcome: Progressing 01/20/2024 1736 by Ewing Novel I, RN Outcome: Progressing   Problem: Education: Goal: Knowledge of General Education information will improve Description: Including pain rating scale, medication(s)/side effects and non-pharmacologic comfort measures 01/20/2024 1738 by Ewing Novel I, RN Outcome: Progressing 01/20/2024 1736 by Ewing Novel I, RN Outcome: Progressing   Problem: Health Behavior/Discharge Planning: Goal: Ability to manage health-related needs will improve 01/20/2024 1738 by Ewing Novel I, RN Outcome: Progressing 01/20/2024 1736 by Ewing Novel I, RN Outcome: Progressing   Problem: Clinical Measurements: Goal: Ability to maintain clinical measurements within normal limits will improve 01/20/2024 1738 by Ewing Novel I, RN Outcome: Progressing 01/20/2024 1736 by Ewing Novel I, RN Outcome: Progressing Goal: Will remain free from infection 01/20/2024 1738 by Ewing Novel I, RN Outcome: Progressing 01/20/2024 1736 by Ewing Novel I, RN Outcome: Progressing Goal: Diagnostic test results will improve 01/20/2024 1738 by Ewing Novel I, RN Outcome: Progressing 01/20/2024 1736 by Ewing Novel I, RN Outcome: Progressing Goal: Respiratory complications will improve 01/20/2024 1738 by Ewing Novel I, RN Outcome: Progressing 01/20/2024 1736 by Ewing Novel I, RN Outcome: Progressing Goal: Cardiovascular complication will be avoided 01/20/2024 1738 by Ewing Novel I, RN Outcome: Progressing 01/20/2024 1736 by Ewing Novel I, RN Outcome: Progressing   Problem: Activity: Goal: Risk for activity intolerance will  decrease 01/20/2024 1738 by Ewing Novel I, RN Outcome: Progressing 01/20/2024 1736 by Ewing Novel I, RN Outcome: Progressing   Problem: Nutrition: Goal: Adequate nutrition will be maintained 01/20/2024 1738 by Ewing Novel I, RN Outcome: Progressing 01/20/2024 1736 by Ewing Novel FERNS, RN Outcome: Progressing  Problem: Coping: Goal: Level of anxiety will decrease 01/20/2024 1738 by Ewing Novel I, RN Outcome: Progressing 01/20/2024 1736 by Ewing Novel I, RN Outcome: Progressing   Problem: Elimination: Goal: Will not experience complications related to bowel motility 01/20/2024 1738 by Ewing Novel I, RN Outcome: Progressing 01/20/2024 1736 by Ewing Novel I, RN Outcome: Progressing Goal: Will not experience complications related to urinary retention 01/20/2024 1738 by Ewing Novel I, RN Outcome: Progressing 01/20/2024 1736 by Ewing Novel I, RN Outcome: Progressing   Problem: Pain Managment: Goal: General experience of comfort will improve and/or be controlled 01/20/2024 1738 by Ewing Novel I, RN Outcome: Progressing 01/20/2024 1736 by Ewing Novel I, RN Outcome: Progressing   Problem: Safety: Goal: Ability to remain free from injury will improve 01/20/2024 1738 by Ewing Novel I, RN Outcome: Progressing 01/20/2024 1736 by Ewing Novel I, RN Outcome: Progressing   Problem: Skin Integrity: Goal: Risk for impaired skin integrity will decrease 01/20/2024 1738 by Ewing Novel I, RN Outcome: Progressing 01/20/2024 1736 by Ewing Novel I, RN Outcome: Progressing   Problem: Education: Goal: Knowledge of disease or condition will improve 01/20/2024 1738 by Ewing Novel I, RN Outcome: Progressing 01/20/2024 1736 by Ewing Novel I, RN Outcome: Progressing Goal: Knowledge of secondary prevention will improve (MUST DOCUMENT ALL) 01/20/2024 1738 by Ewing Novel I, RN Outcome: Progressing 01/20/2024 1736 by Ewing Novel I, RN Outcome:  Progressing Goal: Knowledge of patient specific risk factors will improve (DELETE if not current risk factor) 01/20/2024 1738 by Ewing Novel I, RN Outcome: Progressing 01/20/2024 1736 by Ewing Novel I, RN Outcome: Progressing   Problem: Ischemic Stroke/TIA Tissue Perfusion: Goal: Complications of ischemic stroke/TIA will be minimized 01/20/2024 1738 by Ewing Novel I, RN Outcome: Progressing 01/20/2024 1736 by Ewing Novel I, RN Outcome: Progressing   Problem: Coping: Goal: Will verbalize positive feelings about self 01/20/2024 1738 by Ewing Novel I, RN Outcome: Progressing 01/20/2024 1736 by Ewing Novel I, RN Outcome: Progressing Goal: Will identify appropriate support needs 01/20/2024 1738 by Ewing Novel I, RN Outcome: Progressing 01/20/2024 1736 by Ewing Novel I, RN Outcome: Progressing   Problem: Health Behavior/Discharge Planning: Goal: Ability to manage health-related needs will improve 01/20/2024 1738 by Ewing Novel I, RN Outcome: Progressing 01/20/2024 1736 by Ewing Novel I, RN Outcome: Progressing Goal: Goals will be collaboratively established with patient/family 01/20/2024 1738 by Ewing Novel I, RN Outcome: Progressing 01/20/2024 1736 by Ewing Novel I, RN Outcome: Progressing   Problem: Self-Care: Goal: Ability to participate in self-care as condition permits will improve 01/20/2024 1738 by Ewing Novel I, RN Outcome: Progressing 01/20/2024 1736 by Ewing Novel I, RN Outcome: Progressing Goal: Verbalization of feelings and concerns over difficulty with self-care will improve 01/20/2024 1738 by Ewing Novel I, RN Outcome: Progressing 01/20/2024 1736 by Ewing Novel I, RN Outcome: Progressing Goal: Ability to communicate needs accurately will improve 01/20/2024 1738 by Ewing Novel I, RN Outcome: Progressing 01/20/2024 1736 by Ewing Novel I, RN Outcome: Progressing   Problem: Nutrition: Goal: Risk of aspiration will  decrease 01/20/2024 1738 by Ewing Novel I, RN Outcome: Progressing 01/20/2024 1736 by Ewing Novel I, RN Outcome: Progressing Goal: Dietary intake will improve 01/20/2024 1738 by Ewing Novel I, RN Outcome: Progressing 01/20/2024 1736 by Ewing Novel I, RN Outcome: Progressing

## 2024-01-20 NOTE — Plan of Care (Signed)

## 2024-01-21 DIAGNOSIS — E870 Hyperosmolality and hypernatremia: Secondary | ICD-10-CM | POA: Diagnosis not present

## 2024-01-21 DIAGNOSIS — E861 Hypovolemia: Secondary | ICD-10-CM | POA: Diagnosis not present

## 2024-01-21 DIAGNOSIS — N179 Acute kidney failure, unspecified: Secondary | ICD-10-CM | POA: Diagnosis not present

## 2024-01-21 DIAGNOSIS — I6381 Other cerebral infarction due to occlusion or stenosis of small artery: Secondary | ICD-10-CM | POA: Diagnosis not present

## 2024-01-21 LAB — RENAL FUNCTION PANEL
Albumin: 2.2 g/dL — ABNORMAL LOW (ref 3.5–5.0)
Anion gap: 10 (ref 5–15)
BUN: 10 mg/dL (ref 6–20)
CO2: 21 mmol/L — ABNORMAL LOW (ref 22–32)
Calcium: 7.9 mg/dL — ABNORMAL LOW (ref 8.9–10.3)
Chloride: 110 mmol/L (ref 98–111)
Creatinine, Ser: 1.11 mg/dL (ref 0.61–1.24)
GFR, Estimated: >60 mL/min (ref >60)
Glucose, Bld: 144 mg/dL — ABNORMAL HIGH (ref 70–99)
Phosphorus: 2.8 mg/dL (ref 2.5–4.6)
Potassium: 3.3 mmol/L — ABNORMAL LOW (ref 3.5–5.1)
Sodium: 141 mmol/L (ref 135–145)

## 2024-01-21 LAB — GLUCOSE, CAPILLARY
Glucose-Capillary: 139 mg/dL — ABNORMAL HIGH (ref 70–99)
Glucose-Capillary: 139 mg/dL — ABNORMAL HIGH (ref 70–99)
Glucose-Capillary: 111 mg/dL — ABNORMAL HIGH (ref 70–99)
Glucose-Capillary: 104 mg/dL — ABNORMAL HIGH (ref 70–99)
Glucose-Capillary: 92 mg/dL (ref 70–99)
Glucose-Capillary: 98 mg/dL (ref 70–99)

## 2024-01-21 LAB — CBC
HCT: 25.4 % — ABNORMAL LOW (ref 39.0–52.0)
Hemoglobin: 8.5 g/dL — ABNORMAL LOW (ref 13.0–17.0)
MCH: 31.3 pg (ref 26.0–34.0)
MCHC: 33.5 g/dL (ref 30.0–36.0)
MCV: 93.4 fL (ref 80.0–100.0)
Platelets: 299 K/uL (ref 150–400)
RBC: 2.72 MIL/uL — ABNORMAL LOW (ref 4.22–5.81)
RDW: 14.9 % (ref 11.5–15.5)
WBC: 5.7 K/uL (ref 4.0–10.5)
nRBC: 0 % (ref 0.0–0.2)

## 2024-01-21 MED ORDER — POTASSIUM CHLORIDE 10 MEQ/100ML IV SOLN
10.0000 meq | INTRAVENOUS | Status: AC
  Administered 2024-01-21 (×3): 10 meq via INTRAVENOUS
  Filled 2024-01-21 (×3): qty 100

## 2024-01-21 NOTE — Progress Notes (Signed)
 [facility]                                                                              Ryan Hughes, is a 58 y.o. male, DOB - 10/30/1965, FMW:[id number] Admit date - 01/11/2024    Outpatient Primary MD for the patient is [name], MD  LOS - 10  days  Chief Complaint  Patient presents with   Altered Mental Status   Hypotension       Brief summary   Patient is a 59 year old male with  dementia, depression, CVA, HLD, type II DM, PVD with right AKA, wheelchair-bound, BPH with suprapubic catheter present to the hospital with complaints of altered mental status. Presented with confusion. Currently being treated for UTI, AKI, hypernatremia, and stroke.    Assessment & Plan     Acute kidney injury, uremia, normal anion gap metabolic acidosis  Acute hypernatremia -Presented with confusion/AMS superimposed on dementia - Workup showed serum creatinine of 4, sodium of 162, bicarb 19. - Unremarkable US  renal. - Suprapubic catheter appears to be functioning okay. - Creatinine has improved, 1.0 at baseline   Sepsis secondary to UTI due to chronic indwelling catheter Chronic suprapubic catheter  BPH -History of chronic suprapubic catheter, changed in ED.   -Presented with hypotension, leukocytosis, tachypnea with potential source of infection in the urine. -Urine culture grew multiple organisms, completed IV Unasyn   Acute on chronic normocytic anemia, UGIB - Baseline hemoglobin appears to be ~8 -Hemoglobin 6.8 on 9/10, received 1 unit packed RBCs.  - Has been off of Brilinta  since 9/8, on aspirin .  Per RN, had black stools, FOBT positive.  GI was consulted.  -Per GI, transfuse as needed, high risk for significant mortality related to anesthesia and procedures, high risk compared to potential benefit and recommended palliative care to discuss goals of care.  IV PPI twice daily and to call back to GI if family wants aggressive intervention or active hemorrhage.   Per GI, due to new CVA, needs to be on Brilinta , if H/H remains stable over the next 24 hours, can resume - Per palliative, patient's niece, [name] wants Brilinta  to resume and if he starts bleeding then proceed with GI intervention - Unfortunately patient has been refusing medications, food, will need core track/PEG versus comfort care  - Brilinta  resumed on 9/13, H&H stable  Intractable nausea and vomiting, abdominal discomfort. Constipation Nutrition/poor p.o. intake, advanced dementia, nonverbal 9/7 reported to have projectile vomiting.  Has minimal oral intake -X-ray abdomen shows evidence of concern for ileus versus small bowel obstruction. -CT abdomen 9/8 showed no evidence of bowel obstruction.  Distal esophageal wall thickening suggesting chronic reflux/esophagitis.  Mild bladder wall thickening.   - Dietitian has been following and recommended palliative to discuss goals of care, initiating enteral nutrition to supplement as p.o. intake remains poor. - Palliative medicine following, will need decision from the family regarding code trach/PEG versus comfort care.   Acute metabolic encephalopathy superimposed on dementia without behavioral disturbance  Unwitnessed fall Acute CVA with prior history of CVA -Metabolic workup showed normal ammonia, TSH, B1, B12, folic acid . CT head negative for any acute stroke. -  MRI shows multiple acute lacunar stroke.  Neurology was consulted. - Was on aspirin  and Plavix .  Now on aspirin  and Brilinta  per neurology recommendation.  Carotid Doppler negative. -SLP PT OT following. -No significant improvement in mentation, very poor oral intake,  - Will resume Brilinta , follow H&H  Essential hypertension - Out of window for permissive hypertension for stroke, resume antihypertensives, continue Coreg  12.5 mg twice daily, started amlodipine  5 mg daily - Also on losartan  50 mg daily outpatient, will resume gradually  Hypokalemia, hypophosphatemia -  K3.3, replaced IV   HTN Presents with hypotension BP stable, continue Coreg  .    Type 2 diabetes mellitus with vascular disease (HCC) On sliding scale insulin  and metformin  and Trulicity . For now continuing only sliding scale. CBG (last 3)  Recent Labs    01/20/24 2317 01/21/24 0423 01/21/24 0733  GLUCAP 117* 139* 139*      PAD, history of AKA, right Was on aspirin  Plavix .  Now on aspirin  and Brilinta .   -Brilinta  currently on hold On 9/9 reports that he had left leg pain.  ABI pending   Concern for sleep apnea in the setting of encephalopathy No prior history of sleep apnea.  Likely apneic spell over in the setting of encephalopathy.   Elevated troponin Likely in the setting of poor clearance due to AKI rather than demand ischemia. No evidence of EKG evidence of ischemia. 2D echo shows EF 75%.  No valvular abnormality.  Moderate protein calorie malnutrition Nutrition Problem: Moderate Malnutrition Etiology: chronic illness (dementia) Signs/Symptoms: mild fat depletion, mild muscle depletion Interventions: Nepro shake - P.o. intake remains poor, palliative reconsulted for GOC  Obesity class I Estimated body mass index is 31.96 kg/m as calculated from the following:   Height as of this encounter: 5' 6 (1.676 m).   Weight as of this encounter: 89.8 kg.  Nonhealing sacral wound, stage III, POA - Wound care per nursing  Code Status: Full code DVT Prophylaxis:     Level of Care: Level of care: Progressive Family Communication: No family member at the bedside Disposition Plan:      Remains inpatient appropriate:      Procedures:    Consultants:     Antimicrobials:   Anti-infectives (From admission, onward)    Start     Dose/Rate Route Frequency Ordered Stop   01/13/24 1515  Ampicillin -Sulbactam (UNASYN ) 3 g in sodium chloride  0.9 % 100 mL IVPB        3 g 200 mL/hr over 30 Minutes Intravenous Every 6 hours 01/13/24 1430 01/17/24 2209   01/12/24 1200   piperacillin -tazobactam (ZOSYN ) IVPB 3.375 g  Status:  Discontinued        3.375 g 12.5 mL/hr over 240 Minutes Intravenous Every 8 hours 01/12/24 0737 01/13/24 1102   01/12/24 1000  linezolid  (ZYVOX ) IVPB 600 mg  Status:  Discontinued        600 mg 300 mL/hr over 60 Minutes Intravenous Every 12 hours 01/11/24 1659 01/12/24 1448   01/12/24 0600  piperacillin -tazobactam (ZOSYN ) IVPB 2.25 g  Status:  Discontinued        2.25 g 100 mL/hr over 30 Minutes Intravenous Every 8 hours 01/11/24 1746 01/12/24 0737   01/11/24 2200  linezolid  (ZYVOX ) IVPB 600 mg  Status:  Discontinued        600 mg 300 mL/hr over 60 Minutes Intravenous Every 12 hours 01/11/24 1659 01/11/24 1659   01/11/24 1415  ceFEPIme  (MAXIPIME ) 2 g in sodium chloride  0.9 % 100 mL IVPB  2 g 200 mL/hr over 30 Minutes Intravenous  Once 01/11/24 1402 01/11/24 1532   01/11/24 1415  metroNIDAZOLE  (FLAGYL ) IVPB 500 mg        500 mg 100 mL/hr over 60 Minutes Intravenous  Once 01/11/24 1402 01/11/24 1647   01/11/24 1415  vancomycin  (VANCOCIN ) IVPB 1000 mg/200 mL premix  Status:  Discontinued        1,000 mg 200 mL/hr over 60 Minutes Intravenous  Once 01/11/24 1402 01/11/24 1406   01/11/24 1415  vancomycin  (VANCOREADY) IVPB 1500 mg/300 mL        1,500 mg 150 mL/hr over 120 Minutes Intravenous  Once 01/11/24 1407 01/11/24 1854          Medications   stroke: early stages of recovery book   Does not apply Once   sodium chloride    Intravenous Once   amLODipine   5 mg Oral Daily   aspirin  EC  81 mg Oral Daily   atorvastatin   80 mg Oral QHS   carvedilol   12.5 mg Oral BID WC   dutasteride   0.5 mg Oral Daily   feeding supplement (NEPRO CARB STEADY)  237 mL Oral TID BM   insulin  aspart  0-9 Units Subcutaneous Q4H   leptospermum manuka honey  1 Application Topical Daily   pantoprazole  (PROTONIX ) IV  40 mg Intravenous Q12H   simethicone   80 mg Oral QID   sucralfate   1 g Oral TID WC & HS   tamsulosin   0.8 mg Oral Daily   ticagrelor    90 mg Oral BID      Subjective:   Ryan Hughes was seen and examined today.  Alert and awake, nonverbal, nods to some questions.  Difficult to obtain ROS from the patient   Objective:   Vitals:   01/20/24 1952 01/21/24 0000 01/21/24 0400 01/21/24 0736  BP: (!) 160/73 (!) 146/77 (!) 154/80 (!) 145/80  Pulse: 84 93    Resp: 20 20 14 20   Temp: (!) 97.4 F (36.3 C) 98.9 F (37.2 C) 98.7 F (37.1 C) 98.7 F (37.1 C)  TempSrc: Axillary Axillary Oral Oral  SpO2: 93% 96% 97% 97%  Weight:      Height:        Intake/Output Summary (Last 24 hours) at 01/21/2024 1200 Last data filed at 01/21/2024 9367 Gross per 24 hour  Intake --  Output 550 ml  Net -550 ml     Wt Readings from Last 3 Encounters:  01/11/24 89.8 kg  10/02/23 90 kg  10/02/23 90 kg   Physical Exam General: Alert, nonverbal, advanced dementia Cardiovascular: S1 S2 clear, RRR.  Respiratory: CTAB, no wheezing, rales or rhonchi Gastrointestinal: Soft, nontender, nondistended, NBS Ext: no pedal edema bilaterally Psych: Advair dementia, not following commands   Data Reviewed:  I have personally reviewed following labs    CBC Lab Results  Component Value Date   WBC 5.7 01/21/2024   RBC 2.72 (L) 01/21/2024   HGB 8.5 (L) 01/21/2024   HCT 25.4 (L) 01/21/2024   MCV 93.4 01/21/2024   MCH 31.3 01/21/2024   PLT 299 01/21/2024   MCHC 33.5 01/21/2024   RDW 14.9 01/21/2024   LYMPHSABS 2.4 01/15/2024   MONOABS 0.7 01/15/2024   EOSABS 0.2 01/15/2024   BASOSABS 0.0 01/15/2024     Last metabolic panel Lab Results  Component Value Date   NA 141 01/21/2024   K 3.3 (L) 01/21/2024   CL 110 01/21/2024   CO2 21 (L) 01/21/2024   BUN 10 01/21/2024  CREATININE 1.11 01/21/2024   GLUCOSE 144 (H) 01/21/2024   GFRNONAA >60 01/21/2024   GFRAA >60 03/17/2019   CALCIUM  7.9 (L) 01/21/2024   PHOS 2.8 01/21/2024   PROT 6.5 01/11/2024   ALBUMIN 2.2 (L) 01/21/2024   LABGLOB 2.3 12/08/2021   AGRATIO 2.2 12/08/2021    BILITOT 0.5 01/11/2024   ALKPHOS 74 01/11/2024   AST 19 01/11/2024   ALT 22 01/11/2024   ANIONGAP 10 01/21/2024    CBG (last 3)  Recent Labs    01/20/24 2317 01/21/24 0423 01/21/24 0733  GLUCAP 117* 139* 139*      Coagulation Profile: No results for input(s): INR, PROTIME in the last 168 hours.   Radiology Studies: I have personally reviewed the imaging studies  No results found.      Nydia Distance M.D. [facility] 01/21/2024, 12:00 PM  Available via Epic secure chat 7am-7pm After 7 pm, please refer to night coverage provider listed on amion.

## 2024-01-22 DIAGNOSIS — E861 Hypovolemia: Secondary | ICD-10-CM | POA: Diagnosis not present

## 2024-01-22 DIAGNOSIS — F0390 Unspecified dementia without behavioral disturbance: Secondary | ICD-10-CM

## 2024-01-22 DIAGNOSIS — R627 Adult failure to thrive: Secondary | ICD-10-CM | POA: Diagnosis not present

## 2024-01-22 DIAGNOSIS — Z515 Encounter for palliative care: Secondary | ICD-10-CM | POA: Diagnosis not present

## 2024-01-22 DIAGNOSIS — N179 Acute kidney failure, unspecified: Secondary | ICD-10-CM | POA: Diagnosis not present

## 2024-01-22 DIAGNOSIS — Z7189 Other specified counseling: Secondary | ICD-10-CM | POA: Diagnosis not present

## 2024-01-22 DIAGNOSIS — E870 Hyperosmolality and hypernatremia: Secondary | ICD-10-CM | POA: Diagnosis not present

## 2024-01-22 DIAGNOSIS — I6381 Other cerebral infarction due to occlusion or stenosis of small artery: Secondary | ICD-10-CM | POA: Diagnosis not present

## 2024-01-22 LAB — CBC
HCT: 24.6 % — ABNORMAL LOW (ref 39.0–52.0)
Hemoglobin: 8.1 g/dL — ABNORMAL LOW (ref 13.0–17.0)
MCH: 31.3 pg (ref 26.0–34.0)
MCHC: 32.9 g/dL (ref 30.0–36.0)
MCV: 95 fL (ref 80.0–100.0)
Platelets: 297 K/uL (ref 150–400)
RBC: 2.59 MIL/uL — ABNORMAL LOW (ref 4.22–5.81)
RDW: 14.9 % (ref 11.5–15.5)
WBC: 5.3 K/uL (ref 4.0–10.5)
nRBC: 0 % (ref 0.0–0.2)

## 2024-01-22 LAB — RENAL FUNCTION PANEL
Albumin: 2.2 g/dL — ABNORMAL LOW (ref 3.5–5.0)
Anion gap: 9 (ref 5–15)
BUN: 9 mg/dL (ref 6–20)
CO2: 22 mmol/L (ref 22–32)
Calcium: 8 mg/dL — ABNORMAL LOW (ref 8.9–10.3)
Chloride: 110 mmol/L (ref 98–111)
Creatinine, Ser: 1.14 mg/dL (ref 0.61–1.24)
GFR, Estimated: >60 mL/min (ref >60)
Glucose, Bld: 97 mg/dL (ref 70–99)
Phosphorus: 2.6 mg/dL (ref 2.5–4.6)
Potassium: 3.6 mmol/L (ref 3.5–5.1)
Sodium: 141 mmol/L (ref 135–145)

## 2024-01-22 LAB — GLUCOSE, CAPILLARY
Glucose-Capillary: 100 mg/dL — ABNORMAL HIGH (ref 70–99)
Glucose-Capillary: 104 mg/dL — ABNORMAL HIGH (ref 70–99)
Glucose-Capillary: 127 mg/dL — ABNORMAL HIGH (ref 70–99)
Glucose-Capillary: 129 mg/dL — ABNORMAL HIGH (ref 70–99)
Glucose-Capillary: 89 mg/dL (ref 70–99)

## 2024-01-22 NOTE — Progress Notes (Signed)
 [facility]                                                                              Ryan Hughes, is a 58 y.o. male, DOB - 15-Sep-1965, FMW:[id number] Admit date - 01/11/2024    Outpatient Primary MD for the patient is Ryan Hughes LABOR, MD  LOS - 11  days  Chief Complaint  Patient presents with   Altered Mental Status   Hypotension       Brief summary   Patient is a 58 year old male with  dementia, depression, CVA, HLD, type II DM, PVD with right AKA, wheelchair-bound, BPH with suprapubic catheter present to the hospital with complaints of altered mental status. Presented with confusion. Currently being treated for UTI, AKI, hypernatremia, and stroke.    Assessment & Plan     Acute kidney injury, uremia, normal anion gap metabolic acidosis  Acute hypernatremia -Presented with confusion/AMS superimposed on dementia - Workup showed serum creatinine of 4, sodium of 162, bicarb 19. - Unremarkable US  renal. - Suprapubic catheter appears to be functioning okay. - Creatinine has improved, 1.0 at baseline   Sepsis secondary to UTI due to chronic indwelling catheter Chronic suprapubic catheter  BPH -History of chronic suprapubic catheter, changed in ED.   -Presented with hypotension, leukocytosis, tachypnea with potential source of infection in the urine. -Urine culture grew multiple organisms, completed IV Unasyn   Acute on chronic normocytic anemia, UGIB - Baseline hemoglobin appears to be ~8 -Hemoglobin 6.8 on 9/10, received 1 unit packed RBCs.  - Has been off of Brilinta  since 9/8, on aspirin .  Per RN, had black stools, FOBT positive.  GI was consulted.  -Per GI, transfuse as needed, high risk for significant mortality related to anesthesia and procedures, high risk compared to potential benefit and recommended palliative care to discuss goals of care.  IV PPI twice daily and to call back to GI if family wants aggressive intervention or active hemorrhage.   Per GI, due to new CVA, needs to be on Brilinta , if H/H remains stable over the next 24 hours, can resume - Per palliative, patient's niece, [name] wants Brilinta  to resume and if he starts bleeding then proceed with GI intervention - Unfortunately patient has been refusing medications, food, will need core track/PEG versus comfort care  - Brilinta  resumed on 9/13, H&H slightly low today 8.1 however no bleeding issues.  -Palliative medicine following  Intractable nausea and vomiting, abdominal discomfort. Constipation Nutrition/poor p.o. intake, advanced dementia, nonverbal 9/7 reported to have projectile vomiting.  Has minimal oral intake -X-ray abdomen shows evidence of concern for ileus versus small bowel obstruction. -CT abdomen 9/8 showed no evidence of bowel obstruction.  Distal esophageal wall thickening suggesting chronic reflux/esophagitis.  Mild bladder wall thickening.   - Dietitian has been following and recommended palliative to discuss goals of care, initiating enteral nutrition to supplement as p.o. intake remains poor. - Palliative medicine following, will need decision from the family regarding code trach/PEG versus comfort care.   Acute metabolic encephalopathy superimposed on dementia without behavioral disturbance  Unwitnessed fall Acute CVA with prior history of CVA -Metabolic workup showed normal ammonia, TSH,  B1, B12, folic acid . CT head negative for any acute stroke. -MRI shows multiple acute lacunar stroke.  Neurology was consulted. - Was on aspirin  and Plavix .  Now on aspirin  and Brilinta  per neurology recommendation.  Carotid Doppler negative. -SLP PT OT following. -No significant improvement in mentation, very poor oral intake,  - Brilinta  resumed  Essential hypertension - Out of window for permissive hypertension for stroke, resume antihypertensives, continue Coreg  12.5 mg twice daily, started amlodipine  5 mg daily - Also on losartan  50 mg daily outpatient,  will resume gradually  Hypokalemia, hypophosphatemia - Replace as needed   HTN Presents with hypotension BP stable, continue Coreg  .    Type 2 diabetes mellitus with vascular disease (HCC) On sliding scale insulin  and metformin  and Trulicity . For now continuing only sliding scale. CBG (last 3)  Recent Labs    01/21/24 2319 01/22/24 0303 01/22/24 1146  GLUCAP 98 89 104*      PAD, history of AKA, right Was on aspirin  Plavix .  Now on aspirin  and Brilinta .   -Brilinta  currently on hold On 9/9 reports that he had left leg pain.  ABI pending   Concern for sleep apnea in the setting of encephalopathy No prior history of sleep apnea.  Likely apneic spell over in the setting of encephalopathy.   Elevated troponin Likely in the setting of poor clearance due to AKI rather than demand ischemia. No evidence of EKG evidence of ischemia. 2D echo shows EF 75%.  No valvular abnormality.  Moderate protein calorie malnutrition Nutrition Problem: Moderate Malnutrition Etiology: chronic illness (dementia) Signs/Symptoms: mild fat depletion, mild muscle depletion Interventions: Nepro shake - P.o. intake remains poor, palliative reconsulted for GOC  Obesity class I Estimated body mass index is 31.96 kg/m as calculated from the following:   Height as of this encounter: 5' 6 (1.676 m).   Weight as of this encounter: 89.8 kg.  Nonhealing sacral wound, stage III, POA - Wound care per nursing  Code Status: Full code DVT Prophylaxis:     Level of Care: Level of care: Progressive Family Communication: No family member at the bedside Disposition Plan:      Remains inpatient appropriate:      Procedures:    Consultants:     Antimicrobials:   Anti-infectives (From admission, onward)    Start     Dose/Rate Route Frequency Ordered Stop   01/13/24 1515  Ampicillin -Sulbactam (UNASYN ) 3 g in sodium chloride  0.9 % 100 mL IVPB        3 g 200 mL/hr over 30 Minutes Intravenous Every  6 hours 01/13/24 1430 01/17/24 2209   01/12/24 1200  piperacillin -tazobactam (ZOSYN ) IVPB 3.375 g  Status:  Discontinued        3.375 g 12.5 mL/hr over 240 Minutes Intravenous Every 8 hours 01/12/24 0737 01/13/24 1102   01/12/24 1000  linezolid  (ZYVOX ) IVPB 600 mg  Status:  Discontinued        600 mg 300 mL/hr over 60 Minutes Intravenous Every 12 hours 01/11/24 1659 01/12/24 1448   01/12/24 0600  piperacillin -tazobactam (ZOSYN ) IVPB 2.25 g  Status:  Discontinued        2.25 g 100 mL/hr over 30 Minutes Intravenous Every 8 hours 01/11/24 1746 01/12/24 0737   01/11/24 2200  linezolid  (ZYVOX ) IVPB 600 mg  Status:  Discontinued        600 mg 300 mL/hr over 60 Minutes Intravenous Every 12 hours 01/11/24 1659 01/11/24 1659   01/11/24 1415  ceFEPIme  (MAXIPIME ) 2 g in  sodium chloride  0.9 % 100 mL IVPB        2 g 200 mL/hr over 30 Minutes Intravenous  Once 01/11/24 1402 01/11/24 1532   01/11/24 1415  metroNIDAZOLE  (FLAGYL ) IVPB 500 mg        500 mg 100 mL/hr over 60 Minutes Intravenous  Once 01/11/24 1402 01/11/24 1647   01/11/24 1415  vancomycin  (VANCOCIN ) IVPB 1000 mg/200 mL premix  Status:  Discontinued        1,000 mg 200 mL/hr over 60 Minutes Intravenous  Once 01/11/24 1402 01/11/24 1406   01/11/24 1415  vancomycin  (VANCOREADY) IVPB 1500 mg/300 mL        1,500 mg 150 mL/hr over 120 Minutes Intravenous  Once 01/11/24 1407 01/11/24 1854          Medications   stroke: early stages of recovery book   Does not apply Once   sodium chloride    Intravenous Once   amLODipine   5 mg Oral Daily   aspirin  EC  81 mg Oral Daily   atorvastatin   80 mg Oral QHS   carvedilol   12.5 mg Oral BID WC   dutasteride   0.5 mg Oral Daily   feeding supplement (NEPRO CARB STEADY)  237 mL Oral TID BM   insulin  aspart  0-9 Units Subcutaneous Q4H   leptospermum manuka honey  1 Application Topical Daily   pantoprazole  (PROTONIX ) IV  40 mg Intravenous Q12H   simethicone   80 mg Oral QID   sucralfate   1 g Oral TID  WC & HS   tamsulosin   0.8 mg Oral Daily   ticagrelor   90 mg Oral BID      Subjective:   Ryan Hughes was seen and examined today.  Nonverbal, [facility], reporting MEDICATION.  No acute issues overnight.  Objective:   Vitals:   01/22/24 0032 01/22/24 0344 01/22/24 0744 01/22/24 1147  BP: (!) 154/85 (!) 161/71 (!) 159/92 (!) 152/82  Pulse:   72 77  Resp: 15 17 16  (!) 21  Temp: 98.5 F (36.9 C) 98.1 F (36.7 C) 97.8 F (36.6 C) 98.8 F (37.1 C)  TempSrc: Oral Oral Oral Oral  SpO2: 92% 94% 100% 100%  Weight:      Height:        Intake/Output Summary (Last 24 hours) at 01/22/2024 1203 Last data filed at 01/22/2024 0345 Gross per 24 hour  Intake --  Output 1300 ml  Net -1300 ml     Wt Readings from Last 3 Encounters:  01/11/24 89.8 kg  10/02/23 90 kg  10/02/23 90 kg    Physical Exam General: Awake, nonverbal Cardiovascular: S1 S2 clear, RRR.  Respiratory: CTAB, no wheezing, rales or rhonchi Gastrointestinal: Soft, nontender, nondistended, NBS Ext: no pedal edema bilaterally Neuro: Does not follow commands Psych: Dementia     Data Reviewed:  I have personally reviewed following labs    CBC Lab Results  Component Value Date   WBC 5.3 01/22/2024   RBC 2.59 (L) 01/22/2024   HGB 8.1 (L) 01/22/2024   HCT 24.6 (L) 01/22/2024   MCV 95.0 01/22/2024   MCH 31.3 01/22/2024   PLT 297 01/22/2024   MCHC 32.9 01/22/2024   RDW 14.9 01/22/2024   LYMPHSABS 2.4 01/15/2024   MONOABS 0.7 01/15/2024   EOSABS 0.2 01/15/2024   BASOSABS 0.0 01/15/2024     Last metabolic panel Lab Results  Component Value Date   NA 141 01/22/2024   K 3.6 01/22/2024   CL 110 01/22/2024   CO2  22 01/22/2024   BUN 9 01/22/2024   CREATININE 1.14 01/22/2024   GLUCOSE 97 01/22/2024   GFRNONAA >60 01/22/2024   GFRAA >60 03/17/2019   CALCIUM  8.0 (L) 01/22/2024   PHOS 2.6 01/22/2024   PROT 6.5 01/11/2024   ALBUMIN 2.2 (L) 01/22/2024   LABGLOB 2.3 12/08/2021   AGRATIO 2.2  12/08/2021   BILITOT 0.5 01/11/2024   ALKPHOS 74 01/11/2024   AST 19 01/11/2024   ALT 22 01/11/2024   ANIONGAP 9 01/22/2024    CBG (last 3)  Recent Labs    01/21/24 2319 01/22/24 0303 01/22/24 1146  GLUCAP 98 89 104*      Coagulation Profile: No results for input(s): INR, PROTIME in the last 168 hours.   Radiology Studies: I have personally reviewed the imaging studies  No results found.      Nydia Distance M.D. [facility] 01/22/2024, 12:03 PM  Available via Epic secure chat 7am-7pm After 7 pm, please refer to night coverage provider listed on amion.

## 2024-01-22 NOTE — Progress Notes (Signed)
 Palliative:  HPI: 58 y.o. male with past medical history of dementia, CVA w/ L hemiplegia and aphasia, HLD, HTN, DM2, PVD with right AKA, BPH with suprapubic catheter admitted from [facility] on 01/11/2024 with increasing AMS and sweating. Pt found with acute renal failure, hypernatremia, metabolic acidosis with normal anion gap, sepsis secondary to UTI with suprapubic catheter, acute metabolic encephalopathy. MRI (+) Acute lacunar infaracts involving L frontal, L posterior temporal/ parietal lobe and single acute lacunar infarct R frontal lobe.   I met today at Mr. Imbert bedside along with HCPOA niece [name]. We were able to locate documents in electronic chart. We reviewed together and noted Ms. Greenley's wishes not to pursue resuscitation if declined quality of life. After reviewing document together [name] does agree that Mr. Galvan would desire DNR. He awoke enough to interact and we asked him. Mr. Mogan is able to confirm his wishes and when asked if he wished for resuscitation vs letting him go he clearly tells us  - let me go. [name] agrees with placing DNR/DNI at this time. Document is more ambiguous regarding feeding tube. We attempted to ask Mr. Hoard his desire for feeding tube (he would need PEG) as [name] would proceed. However, Mr. Gloria provides conflicting responses on his desire to proceed with PEG placement. [name] understands that PEG may give him more time but will not improve quality of life. We discussed process of evaluation with CT scan for PEG placement and if this can be done IR vs OR. [name] and I make a plan to re-meet tomorrow ~1pm to discuss with Mr. Carnell again. [name] has struggled with making decisions and she feels at peace with DNR now that she has read his document and he has confirmed his wishes. She is hoping that he can provide her the same guidance with PEG.   [name] is taking time off work to be her with her uncle. She is worried about time off. I  recommended that she pursue FMLA to protect her job if worried. [name] shares that our conversations have been helpful and we will speak again tomorrow.   All questions/concerns addressed. Emotional support provided. Updated Dr. Davia.   Exam: Sleeping but awakens and does have some appropriate responses. Unclear is he is fully oriented but does contribute to conversation and decisions when desired. Breathing regular, unlabored. Abd soft. No distress. Cough after intake.   Plan: - DNR/DNI placed - Meeting with HCPOA at bedside again tomorrow to discuss PEG placement  50 min  Bernarda Kitty, NP Palliative Medicine Team Pager [phone] (Please see [email or web address] for schedule) Team Phone [phone]

## 2024-01-22 NOTE — Plan of Care (Signed)
  Problem: Coping: Goal: Ability to adjust to condition or change in health will improve Outcome: Progressing   Problem: Fluid Volume: Goal: Ability to maintain a balanced intake and output will improve Outcome: Progressing   Problem: Health Behavior/Discharge Planning: Goal: Ability to identify and utilize available resources and services will improve Outcome: Progressing Goal: Ability to manage health-related needs will improve Outcome: Progressing

## 2024-01-23 ENCOUNTER — Other Ambulatory Visit: Payer: Self-pay

## 2024-01-23 DIAGNOSIS — E861 Hypovolemia: Secondary | ICD-10-CM | POA: Diagnosis not present

## 2024-01-23 DIAGNOSIS — R630 Anorexia: Secondary | ICD-10-CM | POA: Diagnosis not present

## 2024-01-23 DIAGNOSIS — Z515 Encounter for palliative care: Secondary | ICD-10-CM | POA: Diagnosis not present

## 2024-01-23 DIAGNOSIS — N179 Acute kidney failure, unspecified: Secondary | ICD-10-CM | POA: Diagnosis not present

## 2024-01-23 DIAGNOSIS — Z7189 Other specified counseling: Secondary | ICD-10-CM | POA: Diagnosis not present

## 2024-01-23 DIAGNOSIS — E870 Hyperosmolality and hypernatremia: Secondary | ICD-10-CM | POA: Diagnosis not present

## 2024-01-23 DIAGNOSIS — I6381 Other cerebral infarction due to occlusion or stenosis of small artery: Secondary | ICD-10-CM | POA: Diagnosis not present

## 2024-01-23 LAB — GLUCOSE, CAPILLARY
Glucose-Capillary: 101 mg/dL — ABNORMAL HIGH (ref 70–99)
Glucose-Capillary: 121 mg/dL — ABNORMAL HIGH (ref 70–99)
Glucose-Capillary: 128 mg/dL — ABNORMAL HIGH (ref 70–99)
Glucose-Capillary: 150 mg/dL — ABNORMAL HIGH (ref 70–99)
Glucose-Capillary: 84 mg/dL (ref 70–99)
Glucose-Capillary: 87 mg/dL (ref 70–99)

## 2024-01-23 NOTE — Progress Notes (Signed)
 Occupational Therapy Treatment Patient Details Name: Ryan Hughes MRN: [id number] DOB: June 06, 1965 Today's Date: 01/23/2024   History of present illness 58 y/o presenting to [facility] from [facility] due to increasing AMS and sweating x 4 days. Pt hypotensive on arrival. Pt found with acute renal failure, hypernatremia, metabolic acidosis with normal anion gap, sepsis, acute metabolic encephalopathy. MRI (+) Acute lacunar infaracts involving L frontal, L posterior temporal/ parietal lobe and single acute lacunar infarct R frontal lobe. PMH includes CVA w/ L hemiplegia and aphasia, HTN, DMII.   OT comments  Pt at adequate level for SNF level care. Ot will sign off acutely. Pt meeting all 3 goals set for this admission. Recommendation for skilled inpatient follow up therapy, <3 hours/day.       If plan is discharge home, recommend the following:  Two people to help with bathing/dressing/bathroom   Equipment Recommendations  Wheelchair (measurements OT);Wheelchair cushion (measurements OT);Hospital bed;Hoyer lift    Recommendations for Other Services Speech consult    Precautions / Restrictions Precautions Precautions: Fall Recall of Precautions/Restrictions: Impaired Precaution/Restrictions Comments: Contact; per neuro, SBP goal <160 Restrictions Weight Bearing Restrictions Per Provider Order: No RLE Weight Bearing Per Provider Order: Non weight bearing Other Position/Activity Restrictions: chronic AKA       Mobility Bed Mobility Overal bed mobility: Needs Assistance Bed Mobility: Rolling Rolling: Max assist         General bed mobility comments: bed positioned into chair for eating and returned to safe HOB elevation    Transfers                         Balance                                           ADL either performed or assessed with clinical judgement   ADL Overall ADL's : Needs assistance/impaired Eating/Feeding:  Minimal assistance;Bed level Eating/Feeding Details (indicate cue type and reason): pt able to self feed pudding and drink when internally motivated which is baseline per family for his care Grooming: Maximal assistance;Wash/dry hands   Upper Body Bathing: Total assistance   Lower Body Bathing: Total assistance   Upper Body Dressing : Total assistance   Lower Body Dressing: Total assistance                      Extremity/Trunk Assessment              Vision       Perception     Praxis     Communication Communication Communication: Impaired Factors Affecting Communication: Difficulty expressing self;Other (comment)   Cognition Arousal: Alert Behavior During Therapy: Flat affect Cognition: History of cognitive impairments             OT - Cognition Comments: pt inconsistent in responses at times but accurately responded to all food related questions. pt inappropriate during session verbally.                 Following commands: Impaired        Cueing   Cueing Techniques: Verbal cues, Gestural cues  Exercises      Shoulder Instructions       General Comments      Pertinent Vitals/ Pain       Pain Assessment Pain Assessment: No/denies pain  Home Living  Prior Functioning/Environment              Frequency  Min 1X/week        Progress Toward Goals  OT Goals(current goals can now be found in the care plan section)  Progress towards OT goals: Goals updated  Acute Rehab OT Goals Patient Stated Goal: inappropriate request to male staff member OT Goal Formulation: Patient unable to participate in goal setting Potential to Achieve Goals: Fair ADL Goals Pt Will Perform Eating: with min assist;bed level (met) Additional ADL Goal #1: pt will demonstrates bed mobility total +2 Min (A) for rolling with bed rail (met) Additional ADL Goal #2: pt will follow 2 step command  50% of session (met)  Plan      Co-evaluation                 AM-PAC OT 6 Clicks Daily Activity     Outcome Measure   Help from another person eating meals?: A Lot Help from another person taking care of personal grooming?: A Lot Help from another person toileting, which includes using toliet, bedpan, or urinal?: Total Help from another person bathing (including washing, rinsing, drying)?: Total Help from another person to put on and taking off regular upper body clothing?: Total Help from another person to put on and taking off regular lower body clothing?: Total 6 Click Score: 8    End of Session    OT Visit Diagnosis: Unsteadiness on feet (R26.81);Muscle weakness (generalized) (M62.81)   Activity Tolerance Patient tolerated treatment well   Patient Left in bed;with call bell/phone within reach;with bed alarm set   Nurse Communication Mobility status;Precautions;Weight bearing status;Need for lift equipment        Time: 8667-8642 OT Time Calculation (min): 25 min  Charges: OT General Charges $OT Visit: 1 Visit OT Treatments $Self Care/Home Management : 23-37 mins   Brynn, OTR/L  Acute Rehabilitation Services Office: [phone] .   Ely Molt 01/23/2024, 3:17 PM

## 2024-01-23 NOTE — Plan of Care (Signed)

## 2024-01-23 NOTE — Progress Notes (Signed)
 Speech Language Pathology Treatment: Dysphagia  Patient Details Name: Ryan Hughes MRN: [id number] DOB: [birth year] Today's Date: 01/23/2024 Time: 8899-8875 SLP Time Calculation (min) (ACUTE ONLY): 24 min  Assessment / Plan / Recommendation Clinical Impression  Ryan Hughes was speaking with Ryan Hughes from Palliative Care upon entering the room. He was more communicative today and made some of his preferences known (e.g., asking for cake, stating that used to enjoy walking, acknowledging that he does not care for thickened liquids). His yes/no reliability for questions about his preferences was good.   He continues to cough intermittently with POs, even with thickener added to liquids. Positioning for eating is challenging, as he often leans to the left and has difficulty maintaining an upright position. He needs assist for set up with feeding and loading utensils; min cues to clear throat and cough.    He may benefit from an MBS this week to gather more information about the pharyngeal swallow and to help with decision making about enteral feeding. D/W Ryan.   SLP will follow.   HPI HPI: 58 y.o. male presented from SNF with AMS. Dx sepsis secondary to UTI with chronic suprapubic catheter, ARF, hypernatremia, uremia. MRI 9/5 pm showed (+) Acute lacunar infaracts involving L frontal, L posterior temporal/ parietal lobe and single acute lacunar infarct R frontal lobe.  Recent poor PO intake and dehydration per notes. PMHx dementia, prior CVA, depression, HLD, PVD with right AKA. Prior MBS 12/17/21- regular diet, overall weakness of pharyngeal constriction, tongue propulsion, premature spillage. No aspiration. ST f/u for diet tolerance/potential progression and speech/language cognitive assessment.      SLP Plan  Continue with current plan of care          Recommendations  Diet recommendations: Dysphagia 2 (fine chop);Nectar-thick liquid Liquids provided via: Cup;Straw Medication  Administration: Whole meds with puree Supervision: Staff to assist with self feeding;Full supervision/cueing for compensatory strategies Compensations: Slow rate;Small sips/bites;Minimize environmental distractions;Monitor for anterior loss;Follow solids with liquid Postural Changes and/or Swallow Maneuvers: Seated upright 90 degrees                  Oral care BID   Frequent or constant Supervision/Assistance Dysphagia, unspecified (R13.10)     Continue with current plan of care    Afnan Emberton L. Vona, MA CCC/SLP Clinical Specialist - Acute Care SLP Acute Rehabilitation Services Office number [phone]  Vona Palma Laurice  01/23/2024, 1:44 PM

## 2024-01-23 NOTE — Progress Notes (Signed)
 Pt's sister visiting. Patient able to take all his meds, 2 ice creams and nepro when sister was present. He has otherwise been refusing. Sister encouraged him to take meds and ice cream.

## 2024-01-23 NOTE — Progress Notes (Signed)
 [facility]                                                                              Ryan Hughes, is a 58 y.o. male, DOB - 01/27/1966, FMW:[id number] Admit date - 01/11/2024    Outpatient Primary MD for the patient is Ryan Hughes LABOR, MD  LOS - 12  days  Chief Complaint  Patient presents with   Altered Mental Status   Hypotension       Brief summary   Patient is a 58 year old male with  dementia, depression, CVA, HLD, type II DM, PVD with right AKA, wheelchair-bound, BPH with suprapubic catheter present to the hospital with complaints of altered mental status. Presented with confusion. Currently being treated for UTI, AKI, hypernatremia, and stroke.    Assessment & Plan     Acute kidney injury, uremia, normal anion gap metabolic acidosis  Acute hypernatremia -Presented with confusion/AMS superimposed on dementia - Workup showed serum creatinine of 4, sodium of 162, bicarb 19. - Unremarkable US  renal. - Suprapubic catheter appears to be functioning okay. - Creatinine has improved, 1.0 at baseline   Sepsis secondary to UTI due to chronic indwelling catheter Chronic suprapubic catheter  BPH -History of chronic suprapubic catheter, changed in ED.   -Presented with hypotension, leukocytosis, tachypnea with potential source of infection in the urine. -Urine culture grew multiple organisms, completed IV Unasyn   Acute on chronic normocytic anemia, UGIB - Baseline hemoglobin appears to be ~8 -Hemoglobin 6.8 on 9/10, received 1 unit packed RBCs.  - Has been off of Brilinta  since 9/8, on aspirin .  Per RN, had black stools, FOBT positive.  GI was consulted.  -Per GI, transfuse as needed, high risk for significant mortality related to anesthesia and procedures, high risk compared to potential benefit and recommended palliative care to discuss goals of care.  IV PPI twice daily and to call back to GI if family wants aggressive intervention or active hemorrhage.   Per GI, due to new CVA, needs to be on Brilinta , if H/H remains stable over the next 24 hours, can resume - Per palliative, patient's niece, Ryan Hughes wants Brilinta  to resume and if he starts bleeding then proceed with GI intervention - Unfortunately patient has been refusing medications, food, will need core track/PEG versus comfort care  - Brilinta  resumed on 9/13, H&H stable, refused CBC today -Palliative medicine following  Intractable nausea and vomiting, abdominal discomfort. Constipation Nutrition/poor p.o. intake, advanced dementia, nonverbal 9/7 reported to have projectile vomiting.  Has minimal oral intake -X-ray abdomen shows evidence of concern for ileus versus small bowel obstruction. -CT abdomen 9/8 showed no evidence of bowel obstruction.  Distal esophageal wall thickening suggesting chronic reflux/esophagitis.  Mild bladder wall thickening.   - Dietitian has recommended GOC, initiating enteral nutrition to supplement as p.o. intake remains poor. - Palliative medicine following, will need decision from the family regarding code trach/PEG versus comfort care.   Acute metabolic encephalopathy superimposed on dementia without behavioral disturbance  Unwitnessed fall Acute CVA with prior history of CVA -Metabolic workup showed normal ammonia, TSH, B1, B12, folic acid . CT head negative for any acute stroke. -MRI shows  multiple acute lacunar stroke.  Neurology was consulted. - Was on aspirin  and Plavix .  Now on aspirin  and Brilinta  per neurology recommendation.  Carotid Doppler negative. -SLP PT OT following. -No significant improvement in mentation, very poor oral intake,  - Brilinta  resumed, H&H stable  Essential hypertension - Out of window for permissive hypertension for stroke, resume antihypertensives, continue Coreg  12.5 mg twice daily, started amlodipine  5 mg daily - Also on losartan  50 mg daily outpatient, will resume gradually  Hypokalemia, hypophosphatemia - Replace  as needed   HTN Presents with hypotension BP stable, continue Coreg  .    Type 2 diabetes mellitus with vascular disease (HCC) On sliding scale insulin  and metformin  and Trulicity . For now continuing only sliding scale. CBG (last 3)  Recent Labs    01/23/24 0335 01/23/24 0720 01/23/24 1118  GLUCAP 87 84 150*      PAD, history of AKA, right Was on aspirin  Plavix .  Now on aspirin  and Brilinta .   -Brilinta  currently on hold On 9/9 reports that he had left leg pain.  ABI pending   Concern for sleep apnea in the setting of encephalopathy No prior history of sleep apnea.  Likely apneic spell over in the setting of encephalopathy.   Elevated troponin Likely in the setting of poor clearance due to AKI rather than demand ischemia. No evidence of EKG evidence of ischemia. 2D echo shows EF 75%.  No valvular abnormality.  Moderate protein calorie malnutrition Nutrition Problem: Moderate Malnutrition Etiology: chronic illness (dementia) Signs/Symptoms: mild fat depletion, mild muscle depletion Interventions: Nepro shake - P.o. intake remains poor, palliative medicine following for GOC  Obesity class I Estimated body mass index is 31.96 kg/m as calculated from the following:   Height as of this encounter: 5' 6 (1.676 m).   Weight as of this encounter: 89.8 kg.  Nonhealing sacral wound, stage III, POA - Wound care per nursing  Code Status: Full code DVT Prophylaxis:     Level of Care: Level of care: Progressive Family Communication: No family member at the bedside Disposition Plan:      Remains inpatient appropriate:      Procedures:    Consultants:     Antimicrobials:   Anti-infectives (From admission, onward)    Start     Dose/Rate Route Frequency Ordered Stop   01/13/24 1515  Ampicillin -Sulbactam (UNASYN ) 3 g in sodium chloride  0.9 % 100 mL IVPB        3 g 200 mL/hr over 30 Minutes Intravenous Every 6 hours 01/13/24 1430 01/17/24 2209   01/12/24 1200   piperacillin -tazobactam (ZOSYN ) IVPB 3.375 g  Status:  Discontinued        3.375 g 12.5 mL/hr over 240 Minutes Intravenous Every 8 hours 01/12/24 0737 01/13/24 1102   01/12/24 1000  linezolid  (ZYVOX ) IVPB 600 mg  Status:  Discontinued        600 mg 300 mL/hr over 60 Minutes Intravenous Every 12 hours 01/11/24 1659 01/12/24 1448   01/12/24 0600  piperacillin -tazobactam (ZOSYN ) IVPB 2.25 g  Status:  Discontinued        2.25 g 100 mL/hr over 30 Minutes Intravenous Every 8 hours 01/11/24 1746 01/12/24 0737   01/11/24 2200  linezolid  (ZYVOX ) IVPB 600 mg  Status:  Discontinued        600 mg 300 mL/hr over 60 Minutes Intravenous Every 12 hours 01/11/24 1659 01/11/24 1659   01/11/24 1415  ceFEPIme  (MAXIPIME ) 2 g in sodium chloride  0.9 % 100 mL IVPB  2 g 200 mL/hr over 30 Minutes Intravenous  Once 01/11/24 1402 01/11/24 1532   01/11/24 1415  metroNIDAZOLE  (FLAGYL ) IVPB 500 mg        500 mg 100 mL/hr over 60 Minutes Intravenous  Once 01/11/24 1402 01/11/24 1647   01/11/24 1415  vancomycin  (VANCOCIN ) IVPB 1000 mg/200 mL premix  Status:  Discontinued        1,000 mg 200 mL/hr over 60 Minutes Intravenous  Once 01/11/24 1402 01/11/24 1406   01/11/24 1415  vancomycin  (VANCOREADY) IVPB 1500 mg/300 mL        1,500 mg 150 mL/hr over 120 Minutes Intravenous  Once 01/11/24 1407 01/11/24 1854          Medications   stroke: early stages of recovery book   Does not apply Once   sodium chloride    Intravenous Once   amLODipine   5 mg Oral Daily   aspirin  EC  81 mg Oral Daily   atorvastatin   80 mg Oral QHS   carvedilol   12.5 mg Oral BID WC   dutasteride   0.5 mg Oral Daily   feeding supplement (NEPRO CARB STEADY)  237 mL Oral TID BM   insulin  aspart  0-9 Units Subcutaneous Q4H   leptospermum manuka honey  1 Application Topical Daily   pantoprazole  (PROTONIX ) IV  40 mg Intravenous Q12H   simethicone   80 mg Oral QID   sucralfate   1 g Oral TID WC & HS   tamsulosin   0.8 mg Oral Daily   ticagrelor    90 mg Oral BID      Subjective:   Ryan Hughes was seen and examined today.  Alert and awake, nonverbal, able to nod as response to some questions..  No acute issues overnight.  Objective:   Vitals:   01/22/24 2338 01/23/24 0451 01/23/24 0719 01/23/24 1137  BP: (!) 146/72 138/63 124/76 (!) 159/72  Pulse: 73 68 68 69  Resp: (!) 23 18 15 16   Temp: 98.9 F (37.2 C) 98.2 F (36.8 C) 97.7 F (36.5 C) 97.8 F (36.6 C)  TempSrc: Oral Oral Axillary Axillary  SpO2: 95% 98%  96%  Weight:      Height:        Intake/Output Summary (Last 24 hours) at 01/23/2024 1405 Last data filed at 01/23/2024 0451 Gross per 24 hour  Intake 360 ml  Output 600 ml  Net -240 ml     Wt Readings from Last 3 Encounters:  01/11/24 89.8 kg  10/02/23 90 kg  10/02/23 90 kg   Physical Exam General: Alert, nonverbal Cardiovascular: S1 S2 clear, RRR.  Respiratory: CTAB, no wheezing, rales or rhonchi Gastrointestinal: Soft, nontender, nondistended, NBS Ext: no pedal edema bilaterally Neuro: Does not follow commands Psych: Dementia    Data Reviewed:  I have personally reviewed following labs    CBC Lab Results  Component Value Date   WBC 5.3 01/22/2024   RBC 2.59 (L) 01/22/2024   HGB 8.1 (L) 01/22/2024   HCT 24.6 (L) 01/22/2024   MCV 95.0 01/22/2024   MCH 31.3 01/22/2024   PLT 297 01/22/2024   MCHC 32.9 01/22/2024   RDW 14.9 01/22/2024   LYMPHSABS 2.4 01/15/2024   MONOABS 0.7 01/15/2024   EOSABS 0.2 01/15/2024   BASOSABS 0.0 01/15/2024     Last metabolic panel Lab Results  Component Value Date   NA 141 01/22/2024   K 3.6 01/22/2024   CL 110 01/22/2024   CO2 22 01/22/2024   BUN 9 01/22/2024   CREATININE  1.14 01/22/2024   GLUCOSE 97 01/22/2024   GFRNONAA >60 01/22/2024   GFRAA >60 03/17/2019   CALCIUM  8.0 (L) 01/22/2024   PHOS 2.6 01/22/2024   PROT 6.5 01/11/2024   ALBUMIN 2.2 (L) 01/22/2024   LABGLOB 2.3 12/08/2021   AGRATIO 2.2 12/08/2021   BILITOT 0.5 01/11/2024    ALKPHOS 74 01/11/2024   AST 19 01/11/2024   ALT 22 01/11/2024   ANIONGAP 9 01/22/2024    CBG (last 3)  Recent Labs    01/23/24 0335 01/23/24 0720 01/23/24 1118  GLUCAP 87 84 150*      Coagulation Profile: No results for input(s): INR, PROTIME in the last 168 hours.   Radiology Studies: I have personally reviewed the imaging studies  No results found.      Nydia Distance M.D. [facility] 01/23/2024, 2:05 PM  Available via Epic secure chat 7am-7pm After 7 pm, please refer to night coverage provider listed on amion.

## 2024-01-23 NOTE — Consult Note (Addendum)
 WOC Nurse wound follow up Wound type: Deep tissue pressure injury evolving into unstageable Measurement: see nursing flow sheets Wound bed: deep purple maroon with areas with scattered epithelial lifting exposing pink/red and dark purple tissue Drainage (amount, consistency, odor) serosanguinous Periwound: intact Dressing procedure/placement/frequency: Will continue current wound care orders as follows:   Cleanse wound with NS, pat dry.  Apply Apply 1/4 thick layer of leptospermum honey to wound bed,  top with silicone foam,  change daily. Ok to lift silicone foam to reapply Medihoney daily.   WOC Nurse team will follow with you and see patient within 10 days for wound assessments.  Please notify WOC nurses of any acute changes in the wounds or any new areas of concern   Thank you,  Doyal Polite, RN, MSN, [facility] WOC Team [phone] (Available Mon-Fri 0700-1500)

## 2024-01-23 NOTE — Progress Notes (Signed)
 Palliative:  HPI: 58 y.o. male with past medical history of dementia, CVA w/ L hemiplegia and aphasia, HLD, HTN, DM2, PVD with right AKA, BPH with suprapubic catheter admitted from [facility] on 01/11/2024 with increasing AMS and sweating. Pt found with acute renal failure, hypernatremia, metabolic acidosis with normal anion gap, sepsis secondary to UTI with suprapubic catheter, acute metabolic encephalopathy. MRI (+) Acute lacunar infaracts involving L frontal, L posterior temporal/ parietal lobe and single acute lacunar infarct R frontal lobe.   I met today with Ryan Hughes. He is awake and interactive. He speaks with me. We discussed his condition and poor intake. We discussed concern for poor intake. I discussed with Ryan Hughes that we have even been considering if we should place feeding tube. We discussed feeding tube in abdomen and if this is something he would desire. He asks me what do you think? I explained that feeding tubes are difficult. I explained that it could give him more calories and nutrition and could potentially help him to live longer. However, I also explained that a feeding tube is not going to make him better or improve his quality of life. I told him that it depends on what is most important to him - to live as long as possible or to focus on letting his body do what it can or cannot do. He seems to indicate at one point that he may not want feeding tube saying keep things the same but unable to clarify what he meant by this.   We were joined by SLP Alan Snuffer who further discusses and helps explain to Ryan Hughes as well. He asks her as well what do you think and she shared similar thoughts with him. Ryan Hughes ultimately became difficult to stay focused on conversation. No clear direction for his wishes. He does share that he misses being able to walk and displeasure with food textures. We attempted to discuss further his declined quality of life and what  this means to the decisions we make moving forward. Still not clear answers today. His directive is less clear on his desire for feeding tube. Alan will plan to pursue MBS to provide more input to swallow function and risk to help with decision making.   I called and discussed with Ryan Hughes. She had left me a voicemail earlier to relay that she had an appointment this afternoon that she forgot about and was unable to be at bedside. I shared my conversation and that her uncle discussed what he misses like walking and drinking Sprite and eating cake. Unfortunately, I was unable to elicit his wishes for or against PEG. I asked Ryan Hughes about her conversations with family. She shared that her aunt feels that he should get PEG and does not agree with DNR. Ryan Hughes and I reviewed his documented wishes and how they are consistent with his stated wishes yesterday. Ryan Hughes agrees with me that he did completely understand what he was telling us  yesterday because he did not just answer yes or no but clearly stated let me go. DNR remains in place. Ongoing conversations regarding PEG placement.   Exam: Awake and interactive. Verbal again today. His mentation and understanding does seem to fluctuate. He seems to have good understanding with very appropriate responses and questions but as conversation goes on he becomes less attentive (this happened yesterday as well). No distress. Breathing regular, unlabored. Abd soft. Delayed cough after intake. Able to feed himself at times.   Plan: -  Maintain DNR - Ongoing discussion regarding PEG placement  55 min  Ryan Kitty, NP Palliative Medicine Team Pager [phone] (Please see [email or web address] for schedule) Team Phone [phone]

## 2024-01-24 DIAGNOSIS — N179 Acute kidney failure, unspecified: Secondary | ICD-10-CM | POA: Diagnosis not present

## 2024-01-24 DIAGNOSIS — Z7189 Other specified counseling: Secondary | ICD-10-CM | POA: Diagnosis not present

## 2024-01-24 DIAGNOSIS — Z515 Encounter for palliative care: Secondary | ICD-10-CM | POA: Diagnosis not present

## 2024-01-24 DIAGNOSIS — R627 Adult failure to thrive: Secondary | ICD-10-CM | POA: Diagnosis not present

## 2024-01-24 LAB — CBC
HCT: 24.6 % — ABNORMAL LOW (ref 39.0–52.0)
Hemoglobin: 8.2 g/dL — ABNORMAL LOW (ref 13.0–17.0)
MCH: 31.4 pg (ref 26.0–34.0)
MCHC: 33.3 g/dL (ref 30.0–36.0)
MCV: 94.3 fL (ref 80.0–100.0)
Platelets: 312 K/uL (ref 150–400)
RBC: 2.61 MIL/uL — ABNORMAL LOW (ref 4.22–5.81)
RDW: 14.5 % (ref 11.5–15.5)
WBC: 7 K/uL (ref 4.0–10.5)
nRBC: 0 % (ref 0.0–0.2)

## 2024-01-24 LAB — RENAL FUNCTION PANEL
Albumin: 2.1 g/dL — ABNORMAL LOW (ref 3.5–5.0)
Anion gap: 5 (ref 5–15)
BUN: 10 mg/dL (ref 6–20)
CO2: 23 mmol/L (ref 22–32)
Calcium: 8 mg/dL — ABNORMAL LOW (ref 8.9–10.3)
Chloride: 111 mmol/L (ref 98–111)
Creatinine, Ser: 1.18 mg/dL (ref 0.61–1.24)
GFR, Estimated: >60 mL/min (ref >60)
Glucose, Bld: 133 mg/dL — ABNORMAL HIGH (ref 70–99)
Phosphorus: 2.3 mg/dL — ABNORMAL LOW (ref 2.5–4.6)
Potassium: 3.2 mmol/L — ABNORMAL LOW (ref 3.5–5.1)
Sodium: 139 mmol/L (ref 135–145)

## 2024-01-24 LAB — GLUCOSE, CAPILLARY
Glucose-Capillary: 123 mg/dL — ABNORMAL HIGH (ref 70–99)
Glucose-Capillary: 154 mg/dL — ABNORMAL HIGH (ref 70–99)
Glucose-Capillary: 102 mg/dL — ABNORMAL HIGH (ref 70–99)
Glucose-Capillary: 81 mg/dL (ref 70–99)
Glucose-Capillary: 156 mg/dL — ABNORMAL HIGH (ref 70–99)

## 2024-01-24 MED ORDER — ORAL CARE MOUTH RINSE: 15.0000 mL | OROMUCOSAL | Status: DC | PRN

## 2024-01-24 MED ORDER — ORAL CARE MOUTH RINSE
15.0000 mL | OROMUCOSAL | Status: DC
  Administered 2024-01-24 – 2024-02-04 (×17): 15 mL via OROMUCOSAL

## 2024-01-24 MED ORDER — PAROXETINE HCL 10 MG PO TABS
10.0000 mg | ORAL_TABLET | Freq: Every day | ORAL | Status: DC
  Administered 2024-01-24 – 2024-02-04 (×7): 10 mg via ORAL
  Filled 2024-01-24 (×15): qty 1

## 2024-01-24 MED ORDER — POTASSIUM CHLORIDE CRYS ER 20 MEQ PO TBCR
40.0000 meq | EXTENDED_RELEASE_TABLET | Freq: Once | ORAL | Status: AC
  Administered 2024-01-24: 40 meq via ORAL
  Filled 2024-01-24: qty 2

## 2024-01-24 NOTE — Progress Notes (Signed)
 [facility]                                                                              Ryan Hughes, is a 58 y.o. male, DOB - 08-14-1965, FMW:[id number] Admit date - 01/11/2024    Outpatient Primary MD for the patient is [name], MD  LOS - 13  days  Chief Complaint  Patient presents with   Altered Mental Status   Hypotension       Brief summary   58/M with advanced dementia from SNF, also has history of CVA, depression, type 2 diabetes mellitus, peripheral vascular disease, right AKA, wheelchair-bound, BPH with suprapubic catheter was brought to the ED with altered mental status, worsening confusion, minimal oral intake . - Admitted, treated for UTI, AKI, hyponatremia, stroke and dysphagia -Palliative care following, goals of care discussions ongoing    Subjective:   -Patient seen, eyes open, laying in bed, awake and alert but does not interact with me - Dr. Lavonne notes from 9/10-9/16 reviewed   Assessment & Plan     Acute kidney injury, uremia, normal anion gap metabolic acidosis  Acute hypernatremia -Presented with confusion/AMS superimposed on dementia - Workup showed serum creatinine of 4, sodium of 162, bicarb 19. - Unremarkable US  renal. - Suprapubic catheter -changed in the ER, functioning - Creatinine has improved, 1.0 at baseline   Sepsis secondary to UTI due to chronic indwelling catheter Chronic suprapubic catheter  BPH -History of chronic suprapubic catheter, changed in ED.   -Presented with hypotension, leukocytosis, tachypnea with potential source of infection in the urine. -Urine culture grew multiple organisms, completed IV Unasyn   Acute on chronic normocytic anemia, UGIB - Baseline hemoglobin appears to be ~8 -Hemoglobin 6.8 on 9/10, received 1 unit packed RBCs.  - Has been off of Brilinta  since 9/8, on aspirin .  Per RN, had black stools, FOBT positive.  GI was consulted.  They recommended supportive care and goals of  care discussions as risk of procedures felt to be too high -Brilinta  eventually resumed 9/13 considering stable hemoglobin and no further active bleeding  Intractable nausea and vomiting, abdominal discomfort. Constipation Dysphagia Nutrition/poor p.o. intake, advanced dementia, nonverbal -X-ray abdomen shows evidence of concern for ileus versus small bowel obstruction. -CT abdomen 9/8 showed no evidence of bowel obstruction.  Distal esophageal wall thickening suggesting chronic reflux/esophagitis.  Mild bladder wall thickening.   - Nausea and vomiting appears to have resolved - Prognosis is poor, palliative care following, our recommendations would be for hospice  Acute metabolic encephalopathy superimposed on dementia without behavioral disturbance  Unwitnessed fall Acute CVA with prior history of CVA -Metabolic workup showed normal ammonia, TSH, B1, B12, folic acid . CT head negative for any acute stroke. -MRI shows multiple acute lacunar stroke.  Neurology was consulted. - Was on aspirin  and Plavix .  Now on aspirin  and Brilinta  per neurology recommendation.  Carotid Doppler negative. -SLP PT OT following. - Awake and alert now however largely nonverbal - Brilinta  resumed, H&H stable - Dysphagia, SLP following, currently on a dysphagia 2 diet, oral intake remains poor - Continue goals of care discussions  Essential hypertension - Out of  window for permissive hypertension for stroke, resume Coreg  12.5 mg twice daily, started amlodipine  5 mg daily  Hypokalemia, hypophosphatemia - Replace as needed   Type 2 diabetes mellitus with vascular disease (HCC) On sliding scale insulin  and metformin  and Trulicity . For now continuing only sliding scale.  PAD, history of AKA, right Was on aspirin  Plavix .  Now on aspirin  and Brilinta .     Elevated troponin Likely in the setting of poor clearance due to AKI rather than demand ischemia. No evidence of EKG evidence of ischemia. 2D echo shows  EF 75%.  No valvular abnormality.  Moderate protein calorie malnutrition Nutrition Problem: Moderate Malnutrition Etiology: chronic illness (dementia) Signs/Symptoms: mild fat depletion, mild muscle depletion Interventions: Nepro shake - P.o. intake remains poor, palliative medicine following for GOC  Obesity class I Estimated body mass index is 31.96 kg/m as calculated from the following:   Height as of this encounter: 5' 6 (1.676 m).   Weight as of this encounter: 89.8 kg.  Nonhealing sacral wound, stage III, POA - Wound care per nursing  Code Status: Full code DVT Prophylaxis:     Level of Care: Level of care: Progressive Family Communication: No family member at the bedside, will attempt to contact niece Disposition Plan:      Remains inpatient appropriate:      Procedures:    Consultants:     Antimicrobials:   Anti-infectives (From admission, onward)    Start     Dose/Rate Route Frequency Ordered Stop   01/13/24 1515  Ampicillin -Sulbactam (UNASYN ) 3 g in sodium chloride  0.9 % 100 mL IVPB        3 g 200 mL/hr over 30 Minutes Intravenous Every 6 hours 01/13/24 1430 01/17/24 2209   01/12/24 1200  piperacillin -tazobactam (ZOSYN ) IVPB 3.375 g  Status:  Discontinued        3.375 g 12.5 mL/hr over 240 Minutes Intravenous Every 8 hours 01/12/24 0737 01/13/24 1102   01/12/24 1000  linezolid  (ZYVOX ) IVPB 600 mg  Status:  Discontinued        600 mg 300 mL/hr over 60 Minutes Intravenous Every 12 hours 01/11/24 1659 01/12/24 1448   01/12/24 0600  piperacillin -tazobactam (ZOSYN ) IVPB 2.25 g  Status:  Discontinued        2.25 g 100 mL/hr over 30 Minutes Intravenous Every 8 hours 01/11/24 1746 01/12/24 0737   01/11/24 2200  linezolid  (ZYVOX ) IVPB 600 mg  Status:  Discontinued        600 mg 300 mL/hr over 60 Minutes Intravenous Every 12 hours 01/11/24 1659 01/11/24 1659   01/11/24 1415  ceFEPIme  (MAXIPIME ) 2 g in sodium chloride  0.9 % 100 mL IVPB        2 g 200 mL/hr over  30 Minutes Intravenous  Once 01/11/24 1402 01/11/24 1532   01/11/24 1415  metroNIDAZOLE  (FLAGYL ) IVPB 500 mg        500 mg 100 mL/hr over 60 Minutes Intravenous  Once 01/11/24 1402 01/11/24 1647   01/11/24 1415  vancomycin  (VANCOCIN ) IVPB 1000 mg/200 mL premix  Status:  Discontinued        1,000 mg 200 mL/hr over 60 Minutes Intravenous  Once 01/11/24 1402 01/11/24 1406   01/11/24 1415  vancomycin  (VANCOREADY) IVPB 1500 mg/300 mL        1,500 mg 150 mL/hr over 120 Minutes Intravenous  Once 01/11/24 1407 01/11/24 1854          Medications   stroke: early stages of recovery book   Does  not apply Once   sodium chloride    Intravenous Once   amLODipine   5 mg Oral Daily   aspirin  EC  81 mg Oral Daily   atorvastatin   80 mg Oral QHS   carvedilol   12.5 mg Oral BID WC   dutasteride   0.5 mg Oral Daily   feeding supplement (NEPRO CARB STEADY)  237 mL Oral TID BM   insulin  aspart  0-9 Units Subcutaneous Q4H   leptospermum manuka honey  1 Application Topical Daily   pantoprazole  (PROTONIX ) IV  40 mg Intravenous Q12H   PARoxetine   10 mg Oral Daily   potassium chloride   40 mEq Oral Once   simethicone   80 mg Oral QID   sucralfate   1 g Oral TID WC & HS   tamsulosin   0.8 mg Oral Daily   ticagrelor   90 mg Oral BID     Objective:   Vitals:   01/23/24 1949 01/23/24 2335 01/24/24 0332 01/24/24 0742  BP: 132/69 (!) 157/73 139/68 139/70  Pulse: 76 73 74 69  Resp: 20 20 20 14   Temp: 99.3 F (37.4 C) 98.1 F (36.7 C) 99 F (37.2 C) 98.1 F (36.7 C)  TempSrc: Axillary Oral Oral Axillary  SpO2:    100%  Weight:      Height:       No intake or output data in the 24 hours ending 01/24/24 1029    Wt Readings from Last 3 Encounters:  01/11/24 89.8 kg  10/02/23 90 kg  10/02/23 90 kg   Physical Exam General: Chronically ill appears older than stated age, awake alert, nonverbal Cardiovascular: S1 S2 clear, RRR.  Respiratory: CTAB, no wheezing, rales or rhonchi Gastrointestinal: Soft,  nontender, nondistended, NBS Ext: Right above-knee amputation Neuro: Does not follow commands Psych: Dementia    Data Reviewed:  I have personally reviewed following labs    CBC Lab Results  Component Value Date   WBC 7.0 01/24/2024   RBC 2.61 (L) 01/24/2024   HGB 8.2 (L) 01/24/2024   HCT 24.6 (L) 01/24/2024   MCV 94.3 01/24/2024   MCH 31.4 01/24/2024   PLT 312 01/24/2024   MCHC 33.3 01/24/2024   RDW 14.5 01/24/2024   LYMPHSABS 2.4 01/15/2024   MONOABS 0.7 01/15/2024   EOSABS 0.2 01/15/2024   BASOSABS 0.0 01/15/2024     Last metabolic panel Lab Results  Component Value Date   NA 139 01/24/2024   K 3.2 (L) 01/24/2024   CL 111 01/24/2024   CO2 23 01/24/2024   BUN 10 01/24/2024   CREATININE 1.18 01/24/2024   GLUCOSE 133 (H) 01/24/2024   GFRNONAA >60 01/24/2024   GFRAA >60 03/17/2019   CALCIUM  8.0 (L) 01/24/2024   PHOS 2.3 (L) 01/24/2024   PROT 6.5 01/11/2024   ALBUMIN 2.1 (L) 01/24/2024   LABGLOB 2.3 12/08/2021   AGRATIO 2.2 12/08/2021   BILITOT 0.5 01/11/2024   ALKPHOS 74 01/11/2024   AST 19 01/11/2024   ALT 22 01/11/2024   ANIONGAP 5 01/24/2024    CBG (last 3)  Recent Labs    01/23/24 2332 01/24/24 0421 01/24/24 0831  GLUCAP 128* 123* 154*      Coagulation Profile: No results for input(s): INR, PROTIME in the last 168 hours.   Radiology Studies: I have personally reviewed the imaging studies  No results found.      Sigurd Pac M.D. [facility] 01/24/2024, 10:29 AM  Available via Epic secure chat 7am-7pm After 7 pm, please refer to night coverage provider listed  on amion.

## 2024-01-24 NOTE — Progress Notes (Signed)
 Physical Therapy Treatment Patient Details Name: Ryan Hughes MRN: [id number] DOB: Feb 25, 1966 Today's Date: 01/24/2024   History of Present Illness 58 y/o presenting to [facility] from [facility] due to increasing AMS and sweating x 4 days. Pt hypotensive on arrival. Pt found with acute renal failure, hypernatremia, metabolic acidosis with normal anion gap, sepsis, acute metabolic encephalopathy. MRI (+) Acute lacunar infaracts involving L frontal, L posterior temporal/ parietal lobe and single acute lacunar infarct R frontal lobe. PMH includes CVA w/ L hemiplegia and aphasia, HTN, DMII.    PT Comments  Pt received in supine, alert and mostly non-verbal, responding via head nods/shakes appropriately with some delay. Pt needing up to maxA for rolling to R side, less assist rolling to his L side. TotalA for hoyer OOB to chair, geomat cushion in chair for skin protection/pressure relief, pillows to promote neutral trunk posture as pt tending to lean to his R side in chair. Continue to recommend return to long term care facility upon DC.    If plan is discharge home, recommend the following: Two people to help with walking and/or transfers;Assist for transportation;Assistance with cooking/housework;Supervision due to cognitive status   Can travel by private vehicle     No  Equipment Recommendations  None recommended by PT (hoyer at facility)    Recommendations for Other Services       Precautions / Restrictions Precautions Precautions: Fall Recall of Precautions/Restrictions: Impaired Precaution/Restrictions Comments: Contact; per neuro, SBP goal <160 Restrictions Weight Bearing Restrictions Per Provider Order: No Other Position/Activity Restrictions: chronic AKA     Mobility  Bed Mobility Overal bed mobility: Needs Assistance Bed Mobility: Rolling, Supine to Sit Rolling: Mod assist, Max assist, Used rails   Supine to sit: Mod assist, +2 for physical assistance, Used  rails     General bed mobility comments: Rolling L/R with multimodal cues for placement of lift pad and removal of chuck pad and placement of briefs under him prior to hoyer OOB; MaxA to roll to R, modA to roll to L side, good bed rail use when cued. ModA for reclined posture in chair>long sitting x3 reps to reposition pillows/pads behind him.    Transfers Overall transfer level: Needs assistance   Transfers: Bed to chair/wheelchair/BSC             General transfer comment: hoyer lift OOB to chair Transfer via Lift Equipment: Maximove  Ambulation/Gait                   Stairs             Wheelchair Mobility     Tilt Bed    Modified Rankin (Stroke Patients Only)       Balance Overall balance assessment: Needs assistance Sitting-balance support: Bilateral upper extremity supported, Feet supported Sitting balance-Leahy Scale: Poor Sitting balance - Comments: minA for long sitting <1 minute at a time in chair, pt impulsive to push back into extension                                    Communication Communication Communication: Impaired Factors Affecting Communication: Difficulty expressing self;Other (comment) (hx aphasia)  Cognition Arousal: Alert Behavior During Therapy: Flat affect   PT - Cognitive impairments: History of cognitive impairments, Orientation, Sequencing Difficult to assess due to: Impaired communication Orientation impairments: Time, Situation  PT - Cognition Comments: Pt mostly nods yes/shakes head no, less verbal this date in responses. Following commands: Impaired      Cueing Cueing Techniques: Verbal cues, Gestural cues, Tactile cues  Exercises      General Comments General comments (skin integrity, edema, etc.): bed pads appear clean, pt suprapubic catheter bag getting full, PTA notified nsg staff via secure chat; BP 151/87 (105) RN notified as this is close to BP goal SBP <160 per  neuro; HR WFL 70's      Pertinent Vitals/Pain Pain Assessment Pain Assessment: PAINAD Breathing: normal Negative Vocalization: occasional moan/groan, low speech, negative/disapproving quality Facial Expression: smiling or inexpressive Body Language: relaxed Consolability: no need to console PAINAD Score: 1 Pain Location: none reported Pain Descriptors / Indicators: Grimacing (occasionally) Pain Intervention(s): Limited activity within patient's tolerance, Monitored during session, Repositioned, Other (comment) (geomat cushion in his chair under his hips for comfort)    Home Living                          Prior Function            PT Goals (current goals can now be found in the care plan section) Acute Rehab PT Goals Patient Stated Goal: To return to SNF per family PT Goal Formulation: Patient unable to participate in goal setting Time For Goal Achievement: 01/26/24 Progress towards PT goals: Progressing toward goals    Frequency    Min 1X/week      PT Plan      Co-evaluation              AM-PAC PT 6 Clicks Mobility   Outcome Measure  Help needed turning from your back to your side while in a flat bed without using bedrails?: A Lot Help needed moving from lying on your back to sitting on the side of a flat bed without using bedrails?: Total Help needed moving to and from a bed to a chair (including a wheelchair)?: Total Help needed standing up from a chair using your arms (e.g., wheelchair or bedside chair)?: Total Help needed to walk in hospital room?: Total Help needed climbing 3-5 steps with a railing? : Total 6 Click Score: 7    End of Session Equipment Utilized During Treatment: Other (comment) (briefs, bed pad assist, hoyer/lift pad) Activity Tolerance: Patient tolerated treatment well Patient left: in chair;with call bell/phone within reach;with chair alarm set Nurse Communication: Mobility status;Need for lift equipment;Precautions  (not to sit up in chair >1 hour due to cog deficit/pt aphasia) PT Visit Diagnosis: Other abnormalities of gait and mobility (R26.89)     Time: 8454-8381 PT Time Calculation (min) (ACUTE ONLY): 33 min  Charges:    $Therapeutic Activity: 23-37 mins PT General Charges $$ ACUTE PT VISIT: 1 Visit                     Jakita Dutkiewicz P., PTA Acute Rehabilitation Services Secure Chat Preferred 9a-5:30pm Office: [phone]    Connell HERO [facility] 01/24/2024, 4:34 PM

## 2024-01-24 NOTE — Plan of Care (Signed)

## 2024-01-24 NOTE — Progress Notes (Signed)
 Nutrition Follow-up  DOCUMENTATION CODES:   Non-severe (moderate) malnutrition in context of chronic illness (dementia)  INTERVENTION:   Continue to discuss GOC and if enteral nutrition support (PEG) aligns with GOC; would recommend initiating enteral nutrition to supplement calorie and protein while PO intake remains poor.   Nepro Shake po TID, each supplement provides 425 kcal and 19 grams protein   Mighty Shake TID with meals, each supplement provides 330 kcals and 9 grams of protein   Encourage adequate intake of DYS 2 + nectar thick diet to meet calorie and protein needs   NUTRITION DIAGNOSIS:   Moderate Malnutrition related to chronic illness (dementia) as evidenced by mild fat depletion, mild muscle depletion.  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 Admitted with AMS, diagnosed UTI, AKI, hypernatremia, and code stroke.  9/4 admitted; MRI shows acute lacunar stroke 9/5 SLP bedside eval recommends DYS 1 w/ nectar thick 9/6 advanced to DYS 2 w/ nectar thick 9/17 Calorie count completed. Pending GOC for PEG  Patient sleeping this morning, briefly woke up and went back to sleep. Breakfast tray at bedside,30% eaten. Ongoing GOC disscussion with niece/POA about PEG placement.  Average Meal Completion  9/11-9/17: 6% average intake x 5 recorded meals  Medications reviewed and include:  Novolog  0-9 units every 4 hrs Protonix  40 mg IV every 12 hrs  Simethicone  QID  Labs reviewed:  K 3.2 Glu 133 Calcium  8.0 (corrected 9.52) Phos 2.3   NUTRITION - FOCUSED PHYSICAL EXAM:  Wheelchair bound [employer] Most Recent Value  Orbital Region Moderate depletion  Upper Arm Region Mild  depletion  Thoracic and Lumbar Region Mild depletion  Buccal Region Mild depletion  Temple Region Moderate depletion  Clavicle Bone Region Mild depletion  Clavicle and Acromion Bone Region Mild depletion  Scapular Bone Region Mild depletion  Dorsal Hand Mild depletion  Patellar Region Mild depletion  [R AKA]  Anterior Thigh Region Mild depletion  [R AKA]  Posterior Calf Region Mild depletion  [R AKA]  Edema (RD Assessment) None  Hair Reviewed  Eyes Unable to assess  [sleeping]  Mouth Unable to assess  [sleeping]  Skin Reviewed  Nails Reviewed    Diet Order:   Diet Order             DIET DYS 2 Fluid consistency: Nectar Thick  Diet effective now                   EDUCATION NEEDS:   Not appropriate for education at this time  Skin:  Skin Assessment: Skin Integrity Issues: Skin Integrity Issues:: Stage II, Incisions Stage II: L buttocks Incisions: R thigh  Last BM:  9/16 type 6  Height:   Ht Readings from Last 1 Encounters:  01/11/24 5' 6 (1.676 m)    Weight:   Wt Readings from Last 1 Encounters:  01/11/24 89.8 kg    Ideal Body Weight:  59.3 kg adjusted for R AKA  BMI:  Body mass index is 31.96 kg/m.  Estimated Nutritional Needs:   Kcal:  1500-1700  Protein:  70-85g  Fluid:  1.5-1.7L   Kvion Shapley, MS, RD, LDN Clinical Dietitian  Please see AMiON for contact information.

## 2024-01-24 NOTE — Progress Notes (Signed)
 Palliative:   HPI: 58 y.o. male with past medical history of dementia, CVA w/ L hemiplegia and aphasia, HLD, HTN, DM2, PVD with right AKA, BPH with suprapubic catheter admitted from [facility] on 01/11/2024 with increasing AMS and sweating. Pt found with acute renal failure, hypernatremia, metabolic acidosis with normal anion gap, sepsis secondary to UTI with suprapubic catheter, acute metabolic encephalopathy. MRI (+) Acute lacunar infaracts involving L frontal, L posterior temporal/ parietal lobe and single acute lacunar infarct R frontal lobe.   I received call from niece/HCPOA [name]. [name] shares that family gathered at bedside yesterday evening and that they feel that Mr. Hair told them I want to live. They interpreted this that he desire full code. [name] is in a difficult position of trying to do what is best for her uncle as his HCPOA, understanding our conversation and his stated and documented wishes for no resuscitation, and also answering to her other family. [name] requests full code status and shares that he is undecided about PEG tube. I did share that these two wishes are inconsistent and that life support would be much worse than PEG tube. [name] expresses understanding. She shares that family are continuing to speak with him. She asks that I call and speak with her aunt, Aldona, and provided me with Marsha's contact number.   I went to bedside and met with Mr. Downum. No visitors to bedside at time of my visit. Mr. Trumbull yawns and will wave to me and shake my hand but is not verbal during my visit today. He seems more tired - perhaps after having more visitors yesterday and this morning. NT at bedside and reports that he ate 25-30% of breakfast but only a couple bites of lunch.   I called and discussed with his sister, Aldona. Aldona and I have a conversation over the phone about his condition and decisions to be made. Aldona is very insistent that she wants her  brother to live and is very happy that he is now a full code again. I did share my conversation with him the other day with [name] present and that this is consistent with his documented wishes on his Advance Directive as well. I tried to explain further what resuscitation would look like for Mr. Rothenberger but Aldona keeps telling me that she needs her brother to live and he will be okay. She plans to talk him into accepting PEG tube when she returns. Unfortunately, she was insistent on her goals for Mr. Fonte and we were not able to have a therapeutic conversation.   All questions/concerns addressed. Emotional support provided.   Exam: Awake and less interactive today. Not verbal. His mentation and understanding does seem to fluctuate. Poor recall. No distress. Breathing regular, unlabored. Abd soft. Delayed cough after intake. Able to feed himself at times.   Plan: - HCPOA reversed DNR to full code - unable to discuss with patient today - Family discussing PEG placement - they are not ready to accept that he is failing to thrive  and anticipate they will want PEG placed  55 min  Bernarda Kitty, NP Palliative Medicine Team Pager [phone] (Please see [email or web address] for schedule) Team Phone [phone]

## 2024-01-24 NOTE — Progress Notes (Signed)
 Sacral wound dressing change performed per MD order without difficulty.

## 2024-01-25 ENCOUNTER — Inpatient Hospital Stay (HOSPITAL_COMMUNITY)

## 2024-01-25 DIAGNOSIS — Z7189 Other specified counseling: Secondary | ICD-10-CM | POA: Diagnosis not present

## 2024-01-25 DIAGNOSIS — Z515 Encounter for palliative care: Secondary | ICD-10-CM | POA: Diagnosis not present

## 2024-01-25 DIAGNOSIS — E861 Hypovolemia: Secondary | ICD-10-CM | POA: Diagnosis not present

## 2024-01-25 DIAGNOSIS — I6381 Other cerebral infarction due to occlusion or stenosis of small artery: Secondary | ICD-10-CM | POA: Diagnosis not present

## 2024-01-25 LAB — CBC
HCT: 23.9 % — ABNORMAL LOW (ref 39.0–52.0)
Hemoglobin: 7.9 g/dL — ABNORMAL LOW (ref 13.0–17.0)
MCH: 31 pg (ref 26.0–34.0)
MCHC: 33.1 g/dL (ref 30.0–36.0)
MCV: 93.7 fL (ref 80.0–100.0)
Platelets: 298 K/uL (ref 150–400)
RBC: 2.55 MIL/uL — ABNORMAL LOW (ref 4.22–5.81)
RDW: 14.5 % (ref 11.5–15.5)
WBC: 7.4 K/uL (ref 4.0–10.5)
nRBC: 0 % (ref 0.0–0.2)

## 2024-01-25 LAB — GLUCOSE, CAPILLARY
Glucose-Capillary: 107 mg/dL — ABNORMAL HIGH (ref 70–99)
Glucose-Capillary: 116 mg/dL — ABNORMAL HIGH (ref 70–99)
Glucose-Capillary: 119 mg/dL — ABNORMAL HIGH (ref 70–99)
Glucose-Capillary: 84 mg/dL (ref 70–99)
Glucose-Capillary: 89 mg/dL (ref 70–99)
Glucose-Capillary: 96 mg/dL (ref 70–99)
Glucose-Capillary: 97 mg/dL (ref 70–99)

## 2024-01-25 MED ORDER — SIMETHICONE 80 MG PO CHEW: 80.0000 mg | CHEWABLE_TABLET | Freq: Four times a day (QID) | ORAL | Status: DC | PRN

## 2024-01-25 NOTE — Evaluation (Signed)
 Modified Barium Swallow Study  Patient Details  Name: Ryan Hughes MRN: [id number] Date of Birth: 03-31-1966  Today's Date: 01/25/2024  Modified Barium Swallow completed.  Full report located under Chart Review in the Imaging Section.  History of Present Illness 58 y.o. male presented from SNF with AMS. Dx sepsis secondary to UTI with chronic suprapubic catheter, ARF, hypernatremia, uremia. MRI 9/5  (+) Acute lacunar infaracts involving L frontal, L posterior temporal/ parietal lobe and single acute lacunar infarct R frontal lobe.  Recent poor PO intake and dehydration per notes. PMHx dementia, prior CVA, depression, HLD, PVD with right AKA. Prior MBS 12/17/21- regular diet, overall weakness of pharyngeal constriction, tongue propulsion, premature spillage. No aspiration. ST f/u for diet tolerance/potential progression and speech/language cognitive assessment.   Clinical Impression Ryan Hughes presented with functional swallowing, exhibiting consistent participation and improved response times from that which is usually demonstrated clinically. Oral phase was c/b mildly slow but functional mastication with no residue after the swallow.  Pharyngeal swallow was delayed, particularly with liquids, with material sitting in pyriform sinuses prior to swallow onset. There was no aspiration.  Pharyngeal contraction, tongue base contraction were WNL - solids passed into esophagus with no residue remaining in the back of the throat.  Visibility was compromised by positioning/shoulder obstruction - careful review confirmed no aspiration of POs.     Focus should be on positioning to optimize independence with eating.  If eating in the recliner is feasible, every effort should be made to do so.  Ryan Hughes's failure to thrive  is the primary obstacle to PO intake.  Recommend advancing to regular diet, thin liquids - I anticipate he will cough during meals, particularly if he is slumped to his left side while eating.   D/W Dr. Vann and Alicia Parker from Palliative Care.  DIGEST Swallow Severity Rating*  Safety: 0  Efficiency: 0  Overall Pharyngeal Swallow Severity: 0 - normal  1: mild; 2: moderate; 3: severe; 4: profound  *The Dynamic Imaging Grade of Swallowing Toxicity is standardized for the head and neck cancer population, however, demonstrates promising clinical applications across populations to standardize the clinical rating of pharyngeal swallow safety and severity.    Factors that may increase risk of adverse event in presence of aspiration Noe & Lianne 2021): Reduced cognitive function;Limited mobility;Poor general health and/or compromised immunity  Swallow Evaluation Recommendations Recommendations: PO diet Medication Administration: Whole meds with puree Supervision: Staff to assist with self-feeding;Patient able to self-feed;Set-up assistance for safety Postural changes: Position pt fully upright for meals Oral care recommendations: Oral care BID (2x/day)   Cayden Rautio L. Vona, MA CCC/SLP Clinical Specialist - Acute Care SLP Acute Rehabilitation Services Office number [phone]    Vona Palma Laurice 01/25/2024,10:09 AM

## 2024-01-25 NOTE — Progress Notes (Signed)
 Pt refused take oral meds ( sucrafate and carvedilol )this afternoon. pt said I said no and started using inappropriate languages and pushing away. MD notified.   Amado GORMAN Arabia, RN

## 2024-01-25 NOTE — TOC Progression Note (Signed)
 Transition of Care [facility]) - Progression Note    Patient Details  Name: Ryan Hughes MRN: [id number] Date of Birth: Oct 25, 1965  Transition of Care [facility]) CM/SW Contact  Montie LOISE Louder, [place] Phone Number: 01/25/2024, 10:23 AM  Clinical Narrative:      TOC continuing to follow and will assist with discharge planning once stable.     Expected Discharge Plan: Skilled Nursing Facility Barriers to Discharge: Continued Medical Work up               Expected Discharge Plan and Services     Post Acute Care Choice: Skilled Nursing Facility Living arrangements for the past 2 months: Skilled Nursing Facility                                       Social Drivers of Health (SDOH) Interventions SDOH Screenings   Food Insecurity: Patient Unable To Answer (01/11/2024)  Housing: Unknown (01/11/2024)  Transportation Needs: Patient Unable To Answer (01/11/2024)  Utilities: Patient Unable To Answer (01/11/2024)  Depression (PHQ2-9): High Risk (12/08/2021)  Tobacco Use: Medium Risk (01/11/2024)    Readmission Risk Interventions     No data to display

## 2024-01-25 NOTE — Progress Notes (Signed)
 [facility]                                                                              Ryan Hughes, is a 58 y.o. male, DOB - 16-Mar-1966, FMW:[id number] Admit date - 01/11/2024    Outpatient Primary MD for the patient is [name], MD  LOS - 14  days  Chief Complaint  Patient presents with   Altered Mental Status   Hypotension       Brief summary   58/M with advanced dementia from SNF, also has history of CVA, depression, type 2 diabetes mellitus, peripheral vascular disease, right AKA, wheelchair-bound, BPH with suprapubic catheter was brought to the ED with altered mental status, worsening confusion, minimal oral intake . - Admitted, treated for UTI, AKI, hyponatremia, stroke and dysphagia--diet has been advanced -Palliative care following, goals of care discussions ongoing    Subjective:   On the way to modified barium swallow - Nursing reporting inappropriate behaviors  Assessment & Plan     Acute kidney injury, uremia, normal anion gap metabolic acidosis  Acute hypernatremia -Presented with confusion/AMS superimposed on dementia - Workup showed serum creatinine of 4, sodium of 162, bicarb 19. - Unremarkable US  renal. - Suprapubic catheter -changed in the ER, functioning - Creatinine has improved, 1.0 at baseline   Sepsis secondary to UTI due to chronic indwelling catheter Chronic suprapubic catheter  BPH -History of chronic suprapubic catheter, changed in ED.   -Presented with hypotension, leukocytosis, tachypnea with potential source of infection in the urine. -Urine culture grew multiple organisms, completed IV Unasyn   Acute on chronic normocytic anemia, UGIB - Baseline hemoglobin appears to be ~8 -Hemoglobin 6.8 on 9/10, received 1 unit packed RBCs.  - Has been off of Brilinta  since 9/8, on aspirin .  Per RN, had black stools, FOBT positive.  GI was consulted.  They recommended supportive care and goals of care discussions as risk  of procedures felt to be too high -Brilinta  eventually resumed 9/13 considering stable hemoglobin and no further active bleeding-continue to monitor  Intractable nausea and vomiting, abdominal discomfort. Constipation Dysphagia Nutrition/poor p.o. intake, advanced dementia, nonverbal -X-ray abdomen shows evidence of concern for ileus versus small bowel obstruction. -CT abdomen 9/8 showed no evidence of bowel obstruction.  Distal esophageal wall thickening suggesting chronic reflux/esophagitis.  Mild bladder wall thickening.   - Nausea and vomiting appears to have resolved - Prognosis is poor, palliative care following, our recommendations would be for hospice-family currently having hard time accepting  Acute metabolic encephalopathy superimposed on dementia without behavioral disturbance  Unwitnessed fall Acute CVA with prior history of CVA -Metabolic workup showed normal ammonia, TSH, B1, B12, folic acid . CT head negative for any acute stroke. -MRI shows multiple acute lacunar stroke.  Neurology was consulted. - Was on aspirin  and Plavix .  Now on aspirin  and Brilinta  per neurology recommendation.  Carotid Doppler negative. -SLP PT OT following. - Awake and alert now however largely nonverbal - Brilinta  resumed, H&H stable - Continue goals of care discussions  Essential hypertension - Out of window for permissive hypertension for stroke, resume Coreg  12.5 mg twice daily, started amlodipine  5 mg  daily  Hypokalemia, hypophosphatemia - Replace as needed   Type 2 diabetes mellitus with vascular disease (HCC) On sliding scale insulin  and metformin  and Trulicity . For now continuing only sliding scale.  PAD, history of AKA, right Was on aspirin  Plavix .  Now on aspirin  and Brilinta .     Elevated troponin Likely in the setting of poor clearance due to AKI rather than demand ischemia. No evidence of EKG evidence of ischemia. 2D echo shows EF 75%.  No valvular abnormality.  Moderate  protein calorie malnutrition Nutrition Problem: Moderate Malnutrition Etiology: chronic illness (dementia) Signs/Symptoms: mild fat depletion, mild muscle depletion Interventions: Nepro shake - P.o. intake remains poor, palliative medicine following   Obesity class I Estimated body mass index is 31.96 kg/m as calculated from the following:   Height as of this encounter: 5' 6 (1.676 m).   Weight as of this encounter: 89.8 kg.  Nonhealing sacral wound, stage III, POA - Wound care per nursing  Code Status: Full code DVT Prophylaxis:     Level of Care: Level of care: Progressive Family Communication: No family member at the bedside Disposition Plan:      Remains inpatient appropriate:        Consultants:   Palliative care GI Neurology      Medications   stroke: early stages of recovery book   Does not apply Once   sodium chloride    Intravenous Once   amLODipine   5 mg Oral Daily   aspirin  EC  81 mg Oral Daily   atorvastatin   80 mg Oral QHS   carvedilol   12.5 mg Oral BID WC   dutasteride   0.5 mg Oral Daily   feeding supplement (NEPRO CARB STEADY)  237 mL Oral TID BM   insulin  aspart  0-9 Units Subcutaneous Q4H   leptospermum manuka honey  1 Application Topical Daily   mouth rinse  15 mL Mouth Rinse 4 times per day   pantoprazole  (PROTONIX ) IV  40 mg Intravenous Q12H   PARoxetine   10 mg Oral Daily   simethicone   80 mg Oral QID   sucralfate   1 g Oral TID WC & HS   tamsulosin   0.8 mg Oral Daily   ticagrelor   90 mg Oral BID     Objective:   Vitals:   01/24/24 1954 01/25/24 0013 01/25/24 0331 01/25/24 0846  BP: (!) 153/85 (!) 144/74 (!) 145/94 (!) 161/87  Pulse:    72  Resp: 19 20 20 17   Temp: (!) 97.4 F (36.3 C) 99 F (37.2 C) 98.7 F (37.1 C) 98 F (36.7 C)  TempSrc: Oral Axillary Oral Oral  SpO2:    98%  Weight:      Height:       No intake or output data in the 24 hours ending 01/25/24 1215    Wt Readings from Last 3 Encounters:  01/11/24 89.8  kg  10/02/23 90 kg  10/02/23 90 kg   In bed, on way to MBBS   Data Reviewed:  I have personally reviewed following labs    CBC Lab Results  Component Value Date   WBC 7.4 01/25/2024   RBC 2.55 (L) 01/25/2024   HGB 7.9 (L) 01/25/2024   HCT 23.9 (L) 01/25/2024   MCV 93.7 01/25/2024   MCH 31.0 01/25/2024   PLT 298 01/25/2024   MCHC 33.1 01/25/2024   RDW 14.5 01/25/2024   LYMPHSABS 2.4 01/15/2024   MONOABS 0.7 01/15/2024   EOSABS 0.2 01/15/2024   BASOSABS 0.0 01/15/2024  Last metabolic panel Lab Results  Component Value Date   NA 139 01/24/2024   K 3.2 (L) 01/24/2024   CL 111 01/24/2024   CO2 23 01/24/2024   BUN 10 01/24/2024   CREATININE 1.18 01/24/2024   GLUCOSE 133 (H) 01/24/2024   GFRNONAA >60 01/24/2024   GFRAA >60 03/17/2019   CALCIUM  8.0 (L) 01/24/2024   PHOS 2.3 (L) 01/24/2024   PROT 6.5 01/11/2024   ALBUMIN 2.1 (L) 01/24/2024   LABGLOB 2.3 12/08/2021   AGRATIO 2.2 12/08/2021   BILITOT 0.5 01/11/2024   ALKPHOS 74 01/11/2024   AST 19 01/11/2024   ALT 22 01/11/2024   ANIONGAP 5 01/24/2024    CBG (last 3)  Recent Labs    01/25/24 0012 01/25/24 0330 01/25/24 0841  GLUCAP 97 84 96      Coagulation Profile: No results for input(s): INR, PROTIME in the last 168 hours.   Radiology Studies: I have personally reviewed the imaging studies  No results found.      Harlene RAYMOND Bowl DO [facility] 01/25/2024, 12:15 PM  Available via Epic secure chat 7am-7pm After 7 pm, please refer to night coverage provider listed on amion.

## 2024-01-25 NOTE — Progress Notes (Signed)
 Palliative:  HPI: 58 y.o. male with past medical history of dementia, CVA w/ L hemiplegia and aphasia, HLD, HTN, DM2, PVD with right AKA, BPH with suprapubic catheter admitted from [facility] on 01/11/2024 with increasing AMS and sweating. Pt found with acute renal failure, hypernatremia, metabolic acidosis with normal anion gap, sepsis secondary to UTI with suprapubic catheter, acute metabolic encephalopathy. MRI (+) Acute lacunar infaracts involving L frontal, L posterior temporal/ parietal lobe and single acute lacunar infarct R frontal lobe.   Discussion with Dr. Juvenal, SLP, and RN. I met with Ryan Hughes. He ate some of his lunch - he ate more of his pot roast today. He was not as verbal or interactive during my visit again today.   I met again at bedside with [name] (niece/HCPOA) along with her husband and son. Ryan Hughes is still more sleepy and less interactive. I discussed MBS results and liberalized diet with [name]. She agrees on continuing with diet and no plans for PEG tube at this time. She Hughes to uphold full code at this time but will continue to consider what is best and what her uncle would want. I shared that my conversation with her aunt did not go very well yesterday. I did express concern that her aunt did not express realistic goals and shared that she believes that Ryan Hughes will walk again which I unfortunately do not believe is a realistic outcome. [name] understands and seems she has had similar conversations. [name] seems more empowered today to do what she needs to do basing her decisions on what is best for Ryan Hughes. I did reassure her that there is a reason that he chose her to make decisions on his behalf as he must trust her.   I did share that I would not be surprised if he were to transition back to facility soon. [name] shares that she does not desire return to [facility] and would like assistance with other options. She tells me that  she was told to work with CSW at [facility] but she has attempted many times by phone as well in person but unable to connect with [place] CSW - will request hospital TOC to assist. I do share that given Ryan Hughes extensive care needs that options may be extremely limited. We discussed expectations that he is very fragile and high risk for decline and infection given bed-bound status and wound. [name] understands.   All questions/concerns addressed. Emotional support provided.   Exam: Alert, less verbal during my visit today. No distress. Continues to move and seem restless in bed (this is baseline). Breathing regular, unlabored. Abd soft. R AKA.   Plan: - Maintain full code although HCPOA will continue to consider if this is truly his desire - Hopeful for improved intake with liberalized diet - Recommend outpatient palliative to follow at facility  55 min  Bernarda Kitty, NP Palliative Medicine Team Pager [phone] (Please see [email or web address] for schedule) Team Phone [phone]

## 2024-01-26 DIAGNOSIS — N179 Acute kidney failure, unspecified: Secondary | ICD-10-CM | POA: Diagnosis not present

## 2024-01-26 DIAGNOSIS — I6381 Other cerebral infarction due to occlusion or stenosis of small artery: Secondary | ICD-10-CM | POA: Diagnosis not present

## 2024-01-26 LAB — BASIC METABOLIC PANEL WITH GFR
Anion gap: 17 — ABNORMAL HIGH (ref 5–15)
BUN: 8 mg/dL (ref 6–20)
CO2: 20 mmol/L — ABNORMAL LOW (ref 22–32)
Calcium: 8.6 mg/dL — ABNORMAL LOW (ref 8.9–10.3)
Chloride: 105 mmol/L (ref 98–111)
Creatinine, Ser: 1.04 mg/dL (ref 0.61–1.24)
GFR, Estimated: >60 mL/min (ref >60)
Glucose, Bld: 106 mg/dL — ABNORMAL HIGH (ref 70–99)
Potassium: 3.9 mmol/L (ref 3.5–5.1)
Sodium: 142 mmol/L (ref 135–145)

## 2024-01-26 LAB — CBC
HCT: 27.2 % — ABNORMAL LOW (ref 39.0–52.0)
Hemoglobin: 8.9 g/dL — ABNORMAL LOW (ref 13.0–17.0)
MCH: 30.9 pg (ref 26.0–34.0)
MCHC: 32.7 g/dL (ref 30.0–36.0)
MCV: 94.4 fL (ref 80.0–100.0)
Platelets: 332 K/uL (ref 150–400)
RBC: 2.88 MIL/uL — ABNORMAL LOW (ref 4.22–5.81)
RDW: 14.3 % (ref 11.5–15.5)
WBC: 8.3 K/uL (ref 4.0–10.5)
nRBC: 0 % (ref 0.0–0.2)

## 2024-01-26 LAB — GLUCOSE, CAPILLARY
Glucose-Capillary: 107 mg/dL — ABNORMAL HIGH (ref 70–99)
Glucose-Capillary: 111 mg/dL — ABNORMAL HIGH (ref 70–99)
Glucose-Capillary: 128 mg/dL — ABNORMAL HIGH (ref 70–99)
Glucose-Capillary: 137 mg/dL — ABNORMAL HIGH (ref 70–99)
Glucose-Capillary: 80 mg/dL (ref 70–99)
Glucose-Capillary: 87 mg/dL (ref 70–99)

## 2024-01-26 MED ORDER — PANTOPRAZOLE SODIUM 40 MG PO TBEC
40.0000 mg | DELAYED_RELEASE_TABLET | Freq: Two times a day (BID) | ORAL | Status: DC
  Administered 2024-01-26 – 2024-02-04 (×10): 40 mg via ORAL
  Filled 2024-01-26 (×20): qty 1

## 2024-01-26 MED ORDER — MIRTAZAPINE 15 MG PO TBDP
15.0000 mg | ORAL_TABLET | Freq: Every day | ORAL | Status: DC
  Administered 2024-01-26 – 2024-02-04 (×7): 15 mg via ORAL
  Filled 2024-01-26 (×13): qty 1

## 2024-01-26 NOTE — Progress Notes (Signed)
 Pt refused coreg  and sulcrafate po.   Amado GORMAN Arabia, RN

## 2024-01-26 NOTE — Progress Notes (Signed)
 MD Dr. Dyana updated regarding patient being non-compliant with telemetry monitor. No new orders. RN has attempted to educate patient regarding the need per MD for monitoring patient adamant and reluctant refusing to comply with orders.

## 2024-01-26 NOTE — Progress Notes (Signed)
 Pt refused insulin  sq (1 unit). Pt used hands to push the insulin  shot away and said no.   Amado GORMAN Arabia, RN

## 2024-01-26 NOTE — Progress Notes (Signed)
 Speech Language Pathology Treatment: Dysphagia  Patient Details Name: Ryan Hughes MRN: [id number] DOB: 11-09-1965 Today's Date: 01/26/2024 Time: 8877-8865 SLP Time Calculation (min) (ACUTE ONLY): 12 min  Assessment / Plan / Recommendation Clinical Impression  Ryan Hughes was minimally verbal today, but when I pressed him to respond he would speak.  HOB was elevated and he fed himself a piece of cake with intermittent sips of thin water  to follow without any difficulty.  There were no clinical concerns for poor toleration (supported by findings on yesterday's MBS) and he was motivated to eat dessert.   PO intake will likely continue to fluctuate, but hopefully advancement to regular solids will help with motivation.  There are no further acute SLP needs.  Continue regular solids, thin liquids; set up tray so Ryan Hughes can feed himself. SLP will sign off.     HPI HPI: 58 y.o. male presented from SNF with AMS. Dx sepsis secondary to UTI with chronic suprapubic catheter, ARF, hypernatremia, uremia. MRI 9/5  (+) Acute lacunar infaracts involving L frontal, L posterior temporal/ parietal lobe and single acute lacunar infarct R frontal lobe.  Recent poor PO intake and dehydration per notes. PMHx dementia, prior CVA, depression, HLD, PVD with right AKA. Prior MBS 12/17/21- regular diet, overall weakness of pharyngeal constriction, tongue propulsion, premature spillage. No aspiration. MBS 9/18: strong swallow function, no aspiration, rec regular diet, thin liquids. ST f/u for diet tolerance/potential progression and speech/language cognitive assessment.      SLP Plan  All goals met          Recommendations  Diet recommendations: Regular;Thin liquid Liquids provided via: Cup;Straw Medication Administration: Whole meds with puree Supervision: Patient able to self feed Postural Changes and/or Swallow Maneuvers: Seated upright 90 degrees                  Oral care BID   Frequent or  constant Supervision/Assistance Dysphagia, oropharyngeal phase (R13.12)     All goals met    Ryan Hughes L. Vona, MA CCC/SLP Clinical Specialist - Acute Care SLP Acute Rehabilitation Services Office number [phone]  Ryan Hughes Ryan Hughes Ryan Hughes  01/26/2024, 12:02 PM

## 2024-01-26 NOTE — Progress Notes (Addendum)
 Pt refused morning oral meds. Used hands to push meds away and keeping saying no. MD notified.   Amado GORMAN Arabia, RN

## 2024-01-26 NOTE — Progress Notes (Signed)
 Called report to 2W nurse. Pt going to room 2w20. Pt's nice, nygina parson being notified.  Amado GORMAN Arabia, RN

## 2024-01-26 NOTE — Progress Notes (Addendum)
 [facility]                                                                              Ryan Hughes, is a 58 y.o. male, DOB - [birth year], FMW:[id number] Admit date - 01/11/2024    Outpatient Primary MD for the patient is Ryan Devoria LABOR, MD  LOS - 15  days  Chief Complaint  Patient presents with   Altered Mental Status   Hypotension       Brief summary   58/M with advanced dementia from SNF, also has history of CVA, depression, type 2 diabetes mellitus, peripheral vascular disease, right AKA, wheelchair-bound, BPH with suprapubic catheter was brought to the ED with altered mental status, worsening confusion, minimal oral intake . - Admitted, treated for UTI, AKI, hyponatremia, stroke and dysphagia--diet has been advanced -Palliative care following, goals of care discussions ongoing    Subjective:   Per nursing continues to refuse to eat and take medications by mouth  Assessment & Plan     Acute kidney injury, uremia, normal anion gap metabolic acidosis  Acute hypernatremia -Presented with confusion/AMS superimposed on dementia - Workup showed serum creatinine of 4, sodium of 162, bicarb 19. - Unremarkable US  renal. - Suprapubic catheter -changed in the ER, functioning - Creatinine has improved, 1.0 at baseline   Sepsis secondary to UTI due to chronic indwelling catheter Chronic suprapubic catheter  BPH -History of chronic suprapubic catheter, changed in ED.   -Presented with hypotension, leukocytosis, tachypnea with potential source of infection in the urine. -Urine culture grew multiple organisms, completed IV Unasyn   Acute on chronic normocytic anemia, UGIB - Baseline hemoglobin appears to be ~8 -Hemoglobin 6.8 on 9/10, received 1 unit packed RBCs.  - Has been off of Brilinta  since 9/8, on aspirin .  Per RN, had black stools, FOBT positive.  GI was consulted.  They recommended supportive care and goals of care discussions as risk of procedures  felt to be too high -Brilinta  eventually resumed 9/13 considering stable hemoglobin and no further active bleeding-continue to monitor  Intractable nausea and vomiting, abdominal discomfort. Constipation Dysphagia Nutrition/poor p.o. intake, advanced dementia, nonverbal -X-ray abdomen shows evidence of concern for ileus versus small bowel obstruction. -CT abdomen 9/8 showed no evidence of bowel obstruction.  Distal esophageal wall thickening suggesting chronic reflux/esophagitis.  Mild bladder wall thickening.   - Nausea and vomiting appears to have resolved - Prognosis is poor, palliative care following, our recommendations would be for hospice-family currently having hard time accepting If family desires to pursue pursuing PEG tube high risk patient would remove said tube - Trial of Remeron   Acute metabolic encephalopathy superimposed on dementia without behavioral disturbance  Unwitnessed fall Acute CVA with prior history of CVA -Metabolic workup showed normal ammonia, TSH, B1, B12, folic acid . CT head negative for any acute stroke. -MRI shows multiple acute lacunar stroke.  Neurology was consulted. - Was on aspirin  and Plavix .  Now on aspirin  and Brilinta  per neurology recommendation.  Carotid Doppler negative. -SLP PT OT following. - Awake and alert now however largely nonverbal - Brilinta  resumed, H&H stable - Continue goals of care discussions  Essential hypertension -  Out of window for permissive hypertension for stroke, resume Coreg  12.5 mg twice daily, started amlodipine  5 mg daily  Hypokalemia, hypophosphatemia - Replace as needed   Type 2 diabetes mellitus with vascular disease (HCC) On sliding scale insulin  and metformin  and Trulicity . For now continuing only sliding scale.  PAD, history of AKA, right Was on aspirin  Plavix .  Now on aspirin  and Brilinta .     Elevated troponin Likely in the setting of poor clearance due to AKI rather than demand ischemia. No  evidence of EKG evidence of ischemia. 2D echo shows EF 75%.  No valvular abnormality.  Moderate protein calorie malnutrition Nutrition Problem: Moderate Malnutrition Etiology: chronic illness (dementia) Signs/Symptoms: mild fat depletion, mild muscle depletion Interventions: Nepro shake - P.o. intake remains poor, palliative medicine following   Obesity class I Estimated body mass index is 31.96 kg/m as calculated from the following:   Height as of this encounter: 5' 6 (1.676 m).   Weight as of this encounter: 89.8 kg.  Nonhealing sacral wound, stage III, POA - Wound care per nursing  Code Status: Full code DVT Prophylaxis:     Level of Care: Level of care: Progressive Family Communication: No family member at the bedside Disposition Plan:      Remains inpatient appropriate:        Consultants:   Palliative care GI Neurology      Medications   stroke: early stages of recovery book   Does not apply Once   sodium chloride    Intravenous Once   amLODipine   5 mg Oral Daily   aspirin  EC  81 mg Oral Daily   atorvastatin   80 mg Oral QHS   carvedilol   12.5 mg Oral BID WC   dutasteride   0.5 mg Oral Daily   feeding supplement (NEPRO CARB STEADY)  237 mL Oral TID BM   insulin  aspart  0-9 Units Subcutaneous Q4H   leptospermum manuka honey  1 Application Topical Daily   mirtazapine   15 mg Oral QHS   mouth rinse  15 mL Mouth Rinse 4 times per day   pantoprazole  (PROTONIX ) IV  40 mg Intravenous Q12H   PARoxetine   10 mg Oral Daily   sucralfate   1 g Oral TID WC & HS   tamsulosin   0.8 mg Oral Daily   ticagrelor   90 mg Oral BID     Objective:   Vitals:   01/25/24 2122 01/25/24 2337 01/26/24 0326 01/26/24 1007  BP: (!) 163/77 (!) 149/75 135/76 136/70  Pulse: 84 81 67 71  Resp: 14 20 20 19   Temp:  99.2 F (37.3 C) 97.6 F (36.4 C) 97.6 F (36.4 C)  TempSrc:  Oral Oral Oral  SpO2:  100% 100% 97%  Weight:      Height:        Intake/Output Summary (Last 24 hours)  at 01/26/2024 1125 Last data filed at 01/26/2024 1000 Gross per 24 hour  Intake 717 ml  Output 2100 ml  Net -1383 ml      Wt Readings from Last 3 Encounters:  01/11/24 89.8 kg  10/02/23 90 kg  10/02/23 90 kg   In bed, poor eye contact, regular rate and rhythm   Data Reviewed:  I have personally reviewed following labs    CBC Lab Results  Component Value Date   WBC 8.3 01/26/2024   RBC 2.88 (L) 01/26/2024   HGB 8.9 (L) 01/26/2024   HCT 27.2 (L) 01/26/2024   MCV 94.4 01/26/2024   MCH 30.9 01/26/2024  PLT 332 01/26/2024   MCHC 32.7 01/26/2024   RDW 14.3 01/26/2024   LYMPHSABS 2.4 01/15/2024   MONOABS 0.7 01/15/2024   EOSABS 0.2 01/15/2024   BASOSABS 0.0 01/15/2024     Last metabolic panel Lab Results  Component Value Date   NA 142 01/26/2024   K 3.9 01/26/2024   CL 105 01/26/2024   CO2 20 (L) 01/26/2024   BUN 8 01/26/2024   CREATININE 1.04 01/26/2024   GLUCOSE 106 (H) 01/26/2024   GFRNONAA >60 01/26/2024   GFRAA >60 03/17/2019   CALCIUM  8.6 (L) 01/26/2024   PHOS 2.3 (L) 01/24/2024   PROT 6.5 01/11/2024   ALBUMIN 2.1 (L) 01/24/2024   LABGLOB 2.3 12/08/2021   AGRATIO 2.2 12/08/2021   BILITOT 0.5 01/11/2024   ALKPHOS 74 01/11/2024   AST 19 01/11/2024   ALT 22 01/11/2024   ANIONGAP 17 (H) 01/26/2024    CBG (last 3)  Recent Labs    01/25/24 2341 01/26/24 0323 01/26/24 1015  GLUCAP 116* 107* 87      Coagulation Profile: No results for input(s): INR, PROTIME in the last 168 hours.   Radiology Studies: I have personally reviewed the imaging studies  No results found.      Harlene RAYMOND Bowl DO [facility] 01/26/2024, 11:25 AM  Available via Epic secure chat 7am-7pm After 7 pm, please refer to night coverage provider listed on amion.

## 2024-01-27 DIAGNOSIS — I6381 Other cerebral infarction due to occlusion or stenosis of small artery: Secondary | ICD-10-CM | POA: Diagnosis not present

## 2024-01-27 LAB — GLUCOSE, CAPILLARY
Glucose-Capillary: 124 mg/dL — ABNORMAL HIGH (ref 70–99)
Glucose-Capillary: 110 mg/dL — ABNORMAL HIGH (ref 70–99)
Glucose-Capillary: 101 mg/dL — ABNORMAL HIGH (ref 70–99)
Glucose-Capillary: 93 mg/dL (ref 70–99)

## 2024-01-27 MED ORDER — INSULIN ASPART 100 UNIT/ML IJ SOLN: 0.0000 [IU] | Freq: Every day | INTRAMUSCULAR | Status: DC

## 2024-01-27 MED ORDER — INSULIN ASPART 100 UNIT/ML IJ SOLN
0.0000 [IU] | Freq: Three times a day (TID) | INTRAMUSCULAR | Status: DC
  Administered 2024-01-30 – 2024-01-31 (×2): 3 [IU] via SUBCUTANEOUS
  Administered 2024-02-02 – 2024-02-05 (×3): 2 [IU] via SUBCUTANEOUS

## 2024-01-27 MED ORDER — HYDROMORPHONE HCL 1 MG/ML IJ SOLN: 0.5000 mg | INTRAMUSCULAR | Status: DC | PRN

## 2024-01-27 NOTE — Progress Notes (Signed)
 [facility]                                                                              Ryan Hughes, is a 58 y.o. male, DOB - [birth year], FMW:[id number] Admit date - 01/11/2024    Outpatient Primary MD for the patient is [name], MD  LOS - 16  days  Chief Complaint  Patient presents with   Altered Mental Status   Hypotension       Brief summary   58/M with advanced dementia from SNF, also has history of CVA, depression, type 2 diabetes mellitus, peripheral vascular disease, right AKA, wheelchair-bound, BPH with suprapubic catheter was brought to the ED with altered mental status, worsening confusion, minimal oral intake . - Admitted, treated for UTI, AKI, hyponatremia, stroke and dysphagia--diet has been advanced -Palliative care following, goals of care discussions ongoing    Subjective:   Does not seem to be eating much  Assessment & Plan     Acute kidney injury, uremia, normal anion gap metabolic acidosis  Acute hypernatremia -Presented with confusion/AMS superimposed on dementia - Workup showed serum creatinine of 4, sodium of 162, bicarb 19. - Unremarkable US  renal. - Suprapubic catheter -changed in the ER, functioning - Creatinine has improved, 1.0 at baseline   Sepsis secondary to UTI due to chronic indwelling catheter Chronic suprapubic catheter  BPH -History of chronic suprapubic catheter, changed in ED.   -Presented with hypotension, leukocytosis, tachypnea with potential source of infection in the urine. -Urine culture grew multiple organisms, completed IV Unasyn   Acute on chronic normocytic anemia, UGIB - Baseline hemoglobin appears to be ~8 -Hemoglobin 6.8 on 9/10, received 1 unit packed RBCs.  - Has been off of Brilinta  since 9/8, on aspirin .  Per RN, had black stools, FOBT positive.  GI was consulted.  They recommended supportive care and goals of care discussions as risk of procedures felt to be too high -Brilinta   eventually resumed 9/13 considering stable hemoglobin and no further active bleeding-continue to monitor  Intractable nausea and vomiting, abdominal discomfort. Constipation Dysphagia Nutrition/poor p.o. intake, advanced dementia, nonverbal -X-ray abdomen shows evidence of concern for ileus versus small bowel obstruction. -CT abdomen 9/8 showed no evidence of bowel obstruction.  Distal esophageal wall thickening suggesting chronic reflux/esophagitis.  Mild bladder wall thickening.   - Nausea and vomiting appears to have resolved - Prognosis is poor, palliative care following, our recommendations would be for hospice-family currently having hard time accepting If family desires to pursue pursuing PEG tube high risk patient would remove said tube - Trial of Remeron   Acute metabolic encephalopathy superimposed on dementia without behavioral disturbance  Unwitnessed fall Acute CVA with prior history of CVA -Metabolic workup showed normal ammonia, TSH, B1, B12, folic acid . CT head negative for any acute stroke. -MRI shows multiple acute lacunar stroke.  Neurology was consulted. - Was on aspirin  and Plavix .  Now on aspirin  and Brilinta  per neurology recommendation.  Carotid Doppler negative. -SLP PT OT following. - Awake and alert now however largely nonverbal - Brilinta  resumed, H&H stable - Continue goals of care discussions  Essential hypertension - Out of window for  permissive hypertension for stroke, resume Coreg  12.5 mg twice daily, started amlodipine  5 mg daily  Hypokalemia, hypophosphatemia - Replace as needed   Type 2 diabetes mellitus with vascular disease (HCC) On sliding scale insulin  and metformin  and Trulicity . For now continuing only sliding scale.  PAD, history of AKA, right Was on aspirin  Plavix .  Now on aspirin  and Brilinta .     Elevated troponin Likely in the setting of poor clearance due to AKI rather than demand ischemia. No evidence of EKG evidence of  ischemia. 2D echo shows EF 75%.  No valvular abnormality.  Moderate protein calorie malnutrition Nutrition Problem: Moderate Malnutrition Etiology: chronic illness (dementia) Signs/Symptoms: mild fat depletion, mild muscle depletion Interventions: Nepro shake - P.o. intake remains poor, palliative medicine following   Obesity class I Estimated body mass index is 31.96 kg/m as calculated from the following:   Height as of this encounter: 5' 6 (1.676 m).   Weight as of this encounter: 89.8 kg.  Nonhealing sacral wound, stage III, POA - Wound care per nursing  Code Status: Full code DVT Prophylaxis:     Level of Care: Level of care: Med-Surg Family Communication: No family member at the bedside Disposition Plan:      Remains inpatient appropriate:        Consultants:   Palliative care GI Neurology      Medications   stroke: early stages of recovery book   Does not apply Once   sodium chloride    Intravenous Once   amLODipine   5 mg Oral Daily   aspirin  EC  81 mg Oral Daily   atorvastatin   80 mg Oral QHS   carvedilol   12.5 mg Oral BID WC   dutasteride   0.5 mg Oral Daily   feeding supplement (NEPRO CARB STEADY)  237 mL Oral TID BM   insulin  aspart  0-15 Units Subcutaneous TID WC   insulin  aspart  0-5 Units Subcutaneous QHS   leptospermum manuka honey  1 Application Topical Daily   mirtazapine   15 mg Oral QHS   mouth rinse  15 mL Mouth Rinse 4 times per day   pantoprazole   40 mg Oral BID   PARoxetine   10 mg Oral Daily   sucralfate   1 g Oral TID WC & HS   tamsulosin   0.8 mg Oral Daily   ticagrelor   90 mg Oral BID     Objective:   Vitals:   01/26/24 1800 01/26/24 2047 01/26/24 2343 01/27/24 0448  BP: (!) 146/86 (!) 159/89 132/65 (!) 130/110  Pulse: 73 72 73 93  Resp: 18   16  Temp: 98.2 F (36.8 C) 98.5 F (36.9 C) 97.8 F (36.6 C) 98 F (36.7 C)  TempSrc: Oral Oral Oral   SpO2: 100% 100% 99% 100%  Weight:      Height:       No intake or output  data in the 24 hours ending 01/27/24 1041     Wt Readings from Last 3 Encounters:  01/11/24 89.8 kg  10/02/23 90 kg  10/02/23 90 kg   In bed, NAD- poor eye contact, non-verbal   Data Reviewed:  I have personally reviewed following labs    CBC Lab Results  Component Value Date   WBC 8.3 01/26/2024   RBC 2.88 (L) 01/26/2024   HGB 8.9 (L) 01/26/2024   HCT 27.2 (L) 01/26/2024   MCV 94.4 01/26/2024   MCH 30.9 01/26/2024   PLT 332 01/26/2024   MCHC 32.7 01/26/2024   RDW 14.3  01/26/2024   LYMPHSABS 2.4 01/15/2024   MONOABS 0.7 01/15/2024   EOSABS 0.2 01/15/2024   BASOSABS 0.0 01/15/2024     Last metabolic panel Lab Results  Component Value Date   NA 142 01/26/2024   K 3.9 01/26/2024   CL 105 01/26/2024   CO2 20 (L) 01/26/2024   BUN 8 01/26/2024   CREATININE 1.04 01/26/2024   GLUCOSE 106 (H) 01/26/2024   GFRNONAA >60 01/26/2024   GFRAA >60 03/17/2019   CALCIUM  8.6 (L) 01/26/2024   PHOS 2.3 (L) 01/24/2024   PROT 6.5 01/11/2024   ALBUMIN 2.1 (L) 01/24/2024   LABGLOB 2.3 12/08/2021   AGRATIO 2.2 12/08/2021   BILITOT 0.5 01/11/2024   ALKPHOS 74 01/11/2024   AST 19 01/11/2024   ALT 22 01/11/2024   ANIONGAP 17 (H) 01/26/2024    CBG (last 3)  Recent Labs    01/26/24 2045 01/26/24 2345 01/27/24 0454  GLUCAP 80 111* 124*      Coagulation Profile: No results for input(s): INR, PROTIME in the last 168 hours.   Radiology Studies: I have personally reviewed the imaging studies  No results found.      Harlene RAYMOND Bowl DO [facility] 01/27/2024, 10:41 AM  Available via Epic secure chat 7am-7pm After 7 pm, please refer to night coverage provider listed on amion.

## 2024-01-27 NOTE — Plan of Care (Signed)
  Problem: Education: Goal: Ability to describe self-care measures that may prevent or decrease complications (Diabetes Survival Skills Education) will improve Outcome: Progressing   Problem: Coping: Goal: Ability to adjust to condition or change in health will improve Outcome: Progressing   Problem: Fluid Volume: Goal: Ability to maintain a balanced intake and output will improve Outcome: Progressing   Problem: Health Behavior/Discharge Planning: Goal: Ability to identify and utilize available resources and services will improve Outcome: Progressing Goal: Ability to manage health-related needs will improve Outcome: Progressing   Problem: Metabolic: Goal: Ability to maintain appropriate glucose levels will improve Outcome: Progressing   Problem: Nutritional: Goal: Maintenance of adequate nutrition will improve Outcome: Progressing Goal: Progress toward achieving an optimal weight will improve Outcome: Progressing   Problem: Skin Integrity: Goal: Risk for impaired skin integrity will decrease Outcome: Progressing   Problem: Tissue Perfusion: Goal: Adequacy of tissue perfusion will improve Outcome: Progressing   Problem: Education: Goal: Knowledge of General Education information will improve Description: Including pain rating scale, medication(s)/side effects and non-pharmacologic comfort measures Outcome: Progressing   Problem: Health Behavior/Discharge Planning: Goal: Ability to manage health-related needs will improve Outcome: Progressing   Problem: Clinical Measurements: Goal: Ability to maintain clinical measurements within normal limits will improve Outcome: Progressing Goal: Will remain free from infection Outcome: Progressing Goal: Diagnostic test results will improve Outcome: Progressing Goal: Respiratory complications will improve Outcome: Progressing Goal: Cardiovascular complication will be avoided Outcome: Progressing   Problem: Activity: Goal:  Risk for activity intolerance will decrease Outcome: Progressing   Problem: Nutrition: Goal: Adequate nutrition will be maintained Outcome: Progressing   Problem: Coping: Goal: Level of anxiety will decrease Outcome: Progressing   Problem: Elimination: Goal: Will not experience complications related to bowel motility Outcome: Progressing Goal: Will not experience complications related to urinary retention Outcome: Progressing   Problem: Pain Managment: Goal: General experience of comfort will improve and/or be controlled Outcome: Progressing   Problem: Safety: Goal: Ability to remain free from injury will improve Outcome: Progressing   Problem: Skin Integrity: Goal: Risk for impaired skin integrity will decrease Outcome: Progressing   Problem: Education: Goal: Knowledge of disease or condition will improve Outcome: Progressing Goal: Knowledge of secondary prevention will improve (MUST DOCUMENT ALL) Outcome: Progressing Goal: Knowledge of patient specific risk factors will improve (DELETE if not current risk factor) Outcome: Progressing   Problem: Ischemic Stroke/TIA Tissue Perfusion: Goal: Complications of ischemic stroke/TIA will be minimized Outcome: Progressing   Problem: Coping: Goal: Will verbalize positive feelings about self Outcome: Progressing Goal: Will identify appropriate support needs Outcome: Progressing   Problem: Health Behavior/Discharge Planning: Goal: Ability to manage health-related needs will improve Outcome: Progressing Goal: Goals will be collaboratively established with patient/family Outcome: Progressing   Problem: Self-Care: Goal: Ability to participate in self-care as condition permits will improve Outcome: Progressing Goal: Verbalization of feelings and concerns over difficulty with self-care will improve Outcome: Progressing Goal: Ability to communicate needs accurately will improve Outcome: Progressing   Problem:  Nutrition: Goal: Risk of aspiration will decrease Outcome: Progressing Goal: Dietary intake will improve Outcome: Progressing

## 2024-01-28 DIAGNOSIS — N179 Acute kidney failure, unspecified: Secondary | ICD-10-CM | POA: Diagnosis not present

## 2024-01-28 DIAGNOSIS — E861 Hypovolemia: Secondary | ICD-10-CM | POA: Diagnosis not present

## 2024-01-28 DIAGNOSIS — E870 Hyperosmolality and hypernatremia: Secondary | ICD-10-CM | POA: Diagnosis not present

## 2024-01-28 LAB — GLUCOSE, CAPILLARY
Glucose-Capillary: 120 mg/dL — ABNORMAL HIGH (ref 70–99)
Glucose-Capillary: 81 mg/dL (ref 70–99)
Glucose-Capillary: 127 mg/dL — ABNORMAL HIGH (ref 70–99)

## 2024-01-28 NOTE — Progress Notes (Signed)
 [facility]                                                                              Ryan Hughes, is a 58 y.o. male, DOB - 1965-05-12, FMW:[id number] Admit date - 01/11/2024    Outpatient Primary MD for the patient is [name], MD  LOS - 17  days  Chief Complaint  Patient presents with   Altered Mental Status   Hypotension       Brief summary   58/M with advanced dementia from SNF, also has history of CVA, depression, type 2 diabetes mellitus, peripheral vascular disease, right AKA, wheelchair-bound, BPH with suprapubic catheter was brought to the ED with altered mental status, worsening confusion, minimal oral intake . - Admitted, treated for UTI, AKI, hyponatremia, stroke and dysphagia--diet has been advanced -Palliative care following, goals of care discussions ongoing Patient continues to refuse all p.o. intake as well as intermittent refusal of medications.  Patient needs to be comfort care as he is not cooperative and my fear is if he does get a PEG tube he will pull it out.   Subjective:   Continues to refuse meds and food  Assessment & Plan     Acute kidney injury, uremia, normal anion gap metabolic acidosis  Acute hypernatremia -Presented with confusion/AMS superimposed on dementia - Workup showed serum creatinine of 4, sodium of 162, bicarb 19. - Unremarkable US  renal. - Suprapubic catheter -changed in the ER, functioning - Creatinine has improved, 1.0 at baseline   Sepsis secondary to UTI due to chronic indwelling catheter Chronic suprapubic catheter  BPH -History of chronic suprapubic catheter, changed in ED.   -Presented with hypotension, leukocytosis, tachypnea with potential source of infection in the urine. -Urine culture grew multiple organisms, completed IV Unasyn   Acute on chronic normocytic anemia, UGIB - Baseline hemoglobin appears to be ~8 -Hemoglobin 6.8 on 9/10, received 1 unit packed RBCs.  - Has been off of  Brilinta  since 9/8, on aspirin .  Per RN, had black stools, FOBT positive.  GI was consulted.  They recommended supportive care and goals of care discussions as risk of procedures felt to be too high -Brilinta  eventually resumed 9/13 considering stable hemoglobin and no further active bleeding-continue to monitor  Intractable nausea and vomiting, abdominal discomfort. Constipation Dysphagia Nutrition/poor p.o. intake, advanced dementia, nonverbal -X-ray abdomen shows evidence of concern for ileus versus small bowel obstruction. -CT abdomen 9/8 showed no evidence of bowel obstruction.  Distal esophageal wall thickening suggesting chronic reflux/esophagitis.  Mild bladder wall thickening.   - Nausea and vomiting appears to have resolved - Prognosis is poor, palliative care following, our recommendations would be for hospice-family currently having hard time accepting If family desires to pursue pursuing PEG tube high risk patient would remove said tube - Trial of Remeron   Acute metabolic encephalopathy superimposed on dementia without behavioral disturbance  Unwitnessed fall Acute CVA with prior history of CVA -Metabolic workup showed normal ammonia, TSH, B1, B12, folic acid . CT head negative for any acute stroke. -MRI shows multiple acute lacunar stroke.  Neurology was consulted. - Was on aspirin  and Plavix .  Now on aspirin   and Brilinta  per neurology recommendation.  Carotid Doppler negative. -SLP PT OT following. - Awake and alert now however largely nonverbal - Brilinta  resumed, H&H stable - Continue goals of care discussions  Essential hypertension - Out of window for permissive hypertension for stroke, resume Coreg  12.5 mg twice daily, started amlodipine  5 mg daily  Hypokalemia, hypophosphatemia - Replace as needed   Type 2 diabetes mellitus with vascular disease (HCC) On sliding scale insulin  and metformin  and Trulicity . For now continuing only sliding scale.  PAD, history of  AKA, right Was on aspirin  Plavix .  Now on aspirin  and Brilinta .     Elevated troponin Likely in the setting of poor clearance due to AKI rather than demand ischemia. No evidence of EKG evidence of ischemia. 2D echo shows EF 75%.  No valvular abnormality.  Moderate protein calorie malnutrition Nutrition Problem: Moderate Malnutrition Etiology: chronic illness (dementia) Signs/Symptoms: mild fat depletion, mild muscle depletion Interventions: Nepro shake - P.o. intake remains poor, palliative medicine following   Obesity class I Estimated body mass index is 31.96 kg/m as calculated from the following:   Height as of this encounter: 5' 6 (1.676 m).   Weight as of this encounter: 89.8 kg.  Nonhealing sacral wound, stage III, POA - Wound care per nursing  Code Status: Full code DVT Prophylaxis:     Level of Care: Level of care: Med-Surg Family Communication: No family member at the bedside Disposition Plan:      Remains inpatient appropriate:        Consultants:   Palliative care GI Neurology      Medications   stroke: early stages of recovery book   Does not apply Once   sodium chloride    Intravenous Once   amLODipine   5 mg Oral Daily   aspirin  EC  81 mg Oral Daily   atorvastatin   80 mg Oral QHS   carvedilol   12.5 mg Oral BID WC   dutasteride   0.5 mg Oral Daily   feeding supplement (NEPRO CARB STEADY)  237 mL Oral TID BM   insulin  aspart  0-15 Units Subcutaneous TID WC   insulin  aspart  0-5 Units Subcutaneous QHS   leptospermum manuka honey  1 Application Topical Daily   mirtazapine   15 mg Oral QHS   mouth rinse  15 mL Mouth Rinse 4 times per day   pantoprazole   40 mg Oral BID   PARoxetine   10 mg Oral Daily   sucralfate   1 g Oral TID WC & HS   tamsulosin   0.8 mg Oral Daily   ticagrelor   90 mg Oral BID     Objective:   Vitals:   01/27/24 1645 01/27/24 1949 01/28/24 0523 01/28/24 0827  BP: (!) 145/57 (!) 149/105 (!) 134/95 (!) 130/96  Pulse: 76 95 76  79  Resp: 18 16  18   Temp:  98.9 F (37.2 C) 97.9 F (36.6 C) 97.8 F (36.6 C)  TempSrc:  Oral  Oral  SpO2: 100% 100% 97% 100%  Weight:      Height:        Intake/Output Summary (Last 24 hours) at 01/28/2024 1202 Last data filed at 01/28/2024 0500 Gross per 24 hour  Intake --  Output 1750 ml  Net -1750 ml       Wt Readings from Last 3 Encounters:  01/11/24 89.8 kg  10/02/23 90 kg  10/02/23 90 kg   In bed, NAD- poor eye contact, non-verbal   Data Reviewed:  I have personally reviewed following  labs    CBC Lab Results  Component Value Date   WBC 8.3 01/26/2024   RBC 2.88 (L) 01/26/2024   HGB 8.9 (L) 01/26/2024   HCT 27.2 (L) 01/26/2024   MCV 94.4 01/26/2024   MCH 30.9 01/26/2024   PLT 332 01/26/2024   MCHC 32.7 01/26/2024   RDW 14.3 01/26/2024   LYMPHSABS 2.4 01/15/2024   MONOABS 0.7 01/15/2024   EOSABS 0.2 01/15/2024   BASOSABS 0.0 01/15/2024     Last metabolic panel Lab Results  Component Value Date   NA 142 01/26/2024   K 3.9 01/26/2024   CL 105 01/26/2024   CO2 20 (L) 01/26/2024   BUN 8 01/26/2024   CREATININE 1.04 01/26/2024   GLUCOSE 106 (H) 01/26/2024   GFRNONAA >60 01/26/2024   GFRAA >60 03/17/2019   CALCIUM  8.6 (L) 01/26/2024   PHOS 2.3 (L) 01/24/2024   PROT 6.5 01/11/2024   ALBUMIN 2.1 (L) 01/24/2024   LABGLOB 2.3 12/08/2021   AGRATIO 2.2 12/08/2021   BILITOT 0.5 01/11/2024   ALKPHOS 74 01/11/2024   AST 19 01/11/2024   ALT 22 01/11/2024   ANIONGAP 17 (H) 01/26/2024    CBG (last 3)  Recent Labs    01/27/24 1645 01/27/24 2155 01/28/24 0824  GLUCAP 101* 93 120*      Coagulation Profile: No results for input(s): INR, PROTIME in the last 168 hours.   Radiology Studies: I have personally reviewed the imaging studies  No results found.      Harlene RAYMOND Bowl DO [facility] 01/28/2024, 12:02 PM  Available via Epic secure chat 7am-7pm After 7 pm, please refer to night coverage provider listed on amion.

## 2024-01-28 NOTE — Progress Notes (Signed)
 Pt in room in bed at this time. Took all scheduled meds as prescribed without difficulty. Pt took the med cup out of this writers hand and poured the meds into his mouth and swallowed without difficulty using the scheduled Nepro. Will monitor for changes throughout the night.

## 2024-01-28 NOTE — Progress Notes (Signed)
Pt refused wound care dressing change

## 2024-01-28 NOTE — Plan of Care (Signed)
  Problem: Nutritional: Goal: Maintenance of adequate nutrition will improve Outcome: Not Progressing Goal: Progress toward achieving an optimal weight will improve Outcome: Not Progressing   Problem: Activity: Goal: Risk for activity intolerance will decrease Outcome: Not Progressing   Problem: Nutrition: Goal: Adequate nutrition will be maintained Outcome: Not Progressing

## 2024-01-28 NOTE — Progress Notes (Addendum)
 Patient refused to take medication, attempted to feed pt breakfast he refused as well. Patient had a BM this am, very difficult to clean pt and change dressing, pt is combative, hitting staff. Did manage to clean pt and change dressing with great difficulty. Provider [place] notified.

## 2024-01-29 DIAGNOSIS — N179 Acute kidney failure, unspecified: Secondary | ICD-10-CM | POA: Diagnosis not present

## 2024-01-29 DIAGNOSIS — R627 Adult failure to thrive: Secondary | ICD-10-CM | POA: Diagnosis not present

## 2024-01-29 DIAGNOSIS — I6381 Other cerebral infarction due to occlusion or stenosis of small artery: Secondary | ICD-10-CM | POA: Diagnosis not present

## 2024-01-29 DIAGNOSIS — Z7189 Other specified counseling: Secondary | ICD-10-CM | POA: Diagnosis not present

## 2024-01-29 DIAGNOSIS — E870 Hyperosmolality and hypernatremia: Secondary | ICD-10-CM | POA: Diagnosis not present

## 2024-01-29 DIAGNOSIS — Z515 Encounter for palliative care: Secondary | ICD-10-CM | POA: Diagnosis not present

## 2024-01-29 LAB — CBC
HCT: 28 % — ABNORMAL LOW (ref 39.0–52.0)
Hemoglobin: 9.2 g/dL — ABNORMAL LOW (ref 13.0–17.0)
MCH: 30.7 pg (ref 26.0–34.0)
MCHC: 32.9 g/dL (ref 30.0–36.0)
MCV: 93.3 fL (ref 80.0–100.0)
Platelets: 364 K/uL (ref 150–400)
RBC: 3 MIL/uL — ABNORMAL LOW (ref 4.22–5.81)
RDW: 14.3 % (ref 11.5–15.5)
WBC: 6.3 K/uL (ref 4.0–10.5)
nRBC: 0 % (ref 0.0–0.2)

## 2024-01-29 LAB — BASIC METABOLIC PANEL WITH GFR
Anion gap: 10 (ref 5–15)
BUN: 10 mg/dL (ref 6–20)
CO2: 25 mmol/L (ref 22–32)
Calcium: 8.8 mg/dL — ABNORMAL LOW (ref 8.9–10.3)
Chloride: 106 mmol/L (ref 98–111)
Creatinine, Ser: 1.27 mg/dL — ABNORMAL HIGH (ref 0.61–1.24)
GFR, Estimated: >60 mL/min (ref >60)
Glucose, Bld: 108 mg/dL — ABNORMAL HIGH (ref 70–99)
Potassium: 3.7 mmol/L (ref 3.5–5.1)
Sodium: 141 mmol/L (ref 135–145)

## 2024-01-29 LAB — GLUCOSE, CAPILLARY
Glucose-Capillary: 109 mg/dL — ABNORMAL HIGH (ref 70–99)
Glucose-Capillary: 98 mg/dL (ref 70–99)
Glucose-Capillary: 174 mg/dL — ABNORMAL HIGH (ref 70–99)

## 2024-01-29 NOTE — Consult Note (Signed)
 WOC Nurse wound follow up Wound type: Classified as Deep tissue pressure injury. Measurement: 2 shallow wounds total 6 cm x 3.5 cm x 0.1 cm.  Wound bed: 70% yellow/white tissue, 30% pink/red. Drainage (amount, consistency, odor) Minimum amount, no odor, serous. Periwound: intact, viable edges, no signs of further injury. Dressing procedure/placement/frequency: Will continue current wound care orders as follows: Cleanse wound with NS, pat dry. Apply Apply 1/4 thick layer of leptospermum honey to wound bed,  top with silicone foam, change daily. Ok to lift silicone foam to reapply Medihoney daily.   WOC team will follow weekly. Please reconsult if further assistance is needed. Thank-you,  Lela Holm RN, CNS, [employer], MSN.  (Phone [phone])

## 2024-01-29 NOTE — Progress Notes (Signed)
 [facility]                                                                              Ryan Hughes, is a 58 y.o. male, DOB - [birth year], FMW:[id number] Admit date - 01/11/2024    Outpatient Primary MD for the patient is [name], MD  LOS - 18  days  Chief Complaint  Patient presents with   Altered Mental Status   Hypotension       Brief summary   58/M with advanced dementia from SNF, also has history of CVA, depression, type 2 diabetes mellitus, peripheral vascular disease, right AKA, wheelchair-bound, BPH with suprapubic catheter was brought to the ED with altered mental status, worsening confusion, minimal oral intake . - Admitted, treated for UTI, AKI, hyponatremia, stroke and dysphagia--diet has been advanced -Palliative care following, goals of care discussions ongoing Patient continues to refuse all p.o. intake as well as intermittent refusal of medications.  Patient needs to be comfort care as he is not cooperative and my fear is if he does get a PEG tube he will pull it out.   Subjective:   Continues to refuse meds and food  Assessment & Plan     Acute kidney injury, uremia, normal anion gap metabolic acidosis  Acute hypernatremia -Presented with confusion/AMS superimposed on dementia - Workup showed serum creatinine of 4, sodium of 162, bicarb 19. - Unremarkable US  renal. - Suprapubic catheter -changed in the ER, functioning - Creatinine has improved, 1.0 at baseline   Sepsis secondary to UTI due to chronic indwelling catheter Chronic suprapubic catheter  BPH -History of chronic suprapubic catheter, changed in ED.   -Presented with hypotension, leukocytosis, tachypnea with potential source of infection in the urine. -Urine culture grew multiple organisms, completed IV Unasyn   Acute on chronic normocytic anemia, UGIB - Baseline hemoglobin appears to be ~8 -Hemoglobin 6.8 on 9/10, received 1 unit packed RBCs.  - Has been off of  Brilinta  since 9/8, on aspirin .  Per RN, had black stools, FOBT positive.  GI was consulted.  They recommended supportive care and goals of care discussions as risk of procedures felt to be too high -Brilinta  eventually resumed 9/13 considering stable hemoglobin and no further active bleeding-continue to monitor  Intractable nausea and vomiting, abdominal discomfort. Constipation Dysphagia Nutrition/poor p.o. intake, advanced dementia, nonverbal -X-ray abdomen shows evidence of concern for ileus versus small bowel obstruction. -CT abdomen 9/8 showed no evidence of bowel obstruction.  Distal esophageal wall thickening suggesting chronic reflux/esophagitis.  Mild bladder wall thickening.   - Nausea and vomiting appears to have resolved - Prognosis is poor, palliative care following, our recommendations would be for hospice-family currently having hard time accepting If family desires to pursue pursuing PEG tube high risk patient would remove said tube - Trial of Remeron   Acute metabolic encephalopathy superimposed on dementia without behavioral disturbance  Unwitnessed fall Acute CVA with prior history of CVA -Metabolic workup showed normal ammonia, TSH, B1, B12, folic acid . CT head negative for any acute stroke. -MRI shows multiple acute lacunar stroke.  Neurology was consulted. - Was on aspirin  and Plavix .  Now on aspirin   and Brilinta  per neurology recommendation.  Carotid Doppler negative. -SLP PT OT following. - Awake and alert now however largely nonverbal - Brilinta  resumed, H&H stable - Continue goals of care discussions  Essential hypertension - Out of window for permissive hypertension for stroke, resume Coreg  12.5 mg twice daily, started amlodipine  5 mg daily  Hypokalemia, hypophosphatemia - Replace as needed   Type 2 diabetes mellitus with vascular disease (HCC) On sliding scale insulin  and metformin  and Trulicity . For now continuing only sliding scale.  PAD, history of  AKA, right Was on aspirin  Plavix .  Now on aspirin  and Brilinta .     Elevated troponin Likely in the setting of poor clearance due to AKI rather than demand ischemia. No evidence of EKG evidence of ischemia. 2D echo shows EF 75%.  No valvular abnormality.  Moderate protein calorie malnutrition Nutrition Problem: Moderate Malnutrition Etiology: chronic illness (dementia) Signs/Symptoms: mild fat depletion, mild muscle depletion Interventions: Nepro shake - P.o. intake remains poor, palliative medicine following   Obesity class I Estimated body mass index is 31.96 kg/m as calculated from the following:   Height as of this encounter: 5' 6 (1.676 m).   Weight as of this encounter: 89.8 kg.  Nonhealing sacral wound, stage III, POA - Wound care per nursing   Patient continues to refuse p.o. meds and food-family needs to decide on PEG versus comfort    Code Status: Full code DVT Prophylaxis:     Level of Care: Level of care: Med-Surg Family Communication: No family member at the bedside Disposition Plan:      Remains inpatient appropriate:        Consultants:   Palliative care GI Neurology      Medications   stroke: early stages of recovery book   Does not apply Once   sodium chloride    Intravenous Once   amLODipine   5 mg Oral Daily   aspirin  EC  81 mg Oral Daily   atorvastatin   80 mg Oral QHS   carvedilol   12.5 mg Oral BID WC   dutasteride   0.5 mg Oral Daily   feeding supplement (NEPRO CARB STEADY)  237 mL Oral TID BM   insulin  aspart  0-15 Units Subcutaneous TID WC   insulin  aspart  0-5 Units Subcutaneous QHS   leptospermum manuka honey  1 Application Topical Daily   mirtazapine   15 mg Oral QHS   mouth rinse  15 mL Mouth Rinse 4 times per day   pantoprazole   40 mg Oral BID   PARoxetine   10 mg Oral Daily   sucralfate   1 g Oral TID WC & HS   tamsulosin   0.8 mg Oral Daily   ticagrelor   90 mg Oral BID     Objective:   Vitals:   01/28/24 2003 01/29/24  0520 01/29/24 0809 01/29/24 0845  BP: (!) 160/76 138/65 117/76 117/76  Pulse: 83 82 78 78  Resp:   18   Temp: 99.6 F (37.6 C) 98 F (36.7 C) 97.8 F (36.6 C)   TempSrc:      SpO2: 100% 100% 100%   Weight:      Height:        Intake/Output Summary (Last 24 hours) at 01/29/2024 1116 Last data filed at 01/29/2024 0500 Gross per 24 hour  Intake 120 ml  Output 350 ml  Net -230 ml       Wt Readings from Last 3 Encounters:  01/11/24 89.8 kg  10/02/23 90 kg  10/02/23 90 kg  In bed, NAD- poor eye contact, non-verbal   Data Reviewed:  I have personally reviewed following labs    CBC Lab Results  Component Value Date   WBC 6.3 01/29/2024   RBC 3.00 (L) 01/29/2024   HGB 9.2 (L) 01/29/2024   HCT 28.0 (L) 01/29/2024   MCV 93.3 01/29/2024   MCH 30.7 01/29/2024   PLT 364 01/29/2024   MCHC 32.9 01/29/2024   RDW 14.3 01/29/2024   LYMPHSABS 2.4 01/15/2024   MONOABS 0.7 01/15/2024   EOSABS 0.2 01/15/2024   BASOSABS 0.0 01/15/2024     Last metabolic panel Lab Results  Component Value Date   NA 141 01/29/2024   K 3.7 01/29/2024   CL 106 01/29/2024   CO2 25 01/29/2024   BUN 10 01/29/2024   CREATININE 1.27 (H) 01/29/2024   GLUCOSE 108 (H) 01/29/2024   GFRNONAA >60 01/29/2024   GFRAA >60 03/17/2019   CALCIUM  8.8 (L) 01/29/2024   PHOS 2.3 (L) 01/24/2024   PROT 6.5 01/11/2024   ALBUMIN 2.1 (L) 01/24/2024   LABGLOB 2.3 12/08/2021   AGRATIO 2.2 12/08/2021   BILITOT 0.5 01/11/2024   ALKPHOS 74 01/11/2024   AST 19 01/11/2024   ALT 22 01/11/2024   ANIONGAP 10 01/29/2024    CBG (last 3)  Recent Labs    01/28/24 1614 01/28/24 2058 01/29/24 0808  GLUCAP 81 127* 109*      Coagulation Profile: No results for input(s): INR, PROTIME in the last 168 hours.   Radiology Studies: I have personally reviewed the imaging studies  No results found.      Harlene RAYMOND Bowl DO [facility] 01/29/2024, 11:16 AM  Available via Epic secure chat 7am-7pm After  7 pm, please refer to night coverage provider listed on amion.

## 2024-01-29 NOTE — Progress Notes (Signed)
 Ryan Hughes refused all his morning medications despite education and encouragement. MD on call and Charge Nurse notified.

## 2024-01-29 NOTE — Plan of Care (Signed)
  Problem: Education: Goal: Ability to describe self-care measures that may prevent or decrease complications (Diabetes Survival Skills Education) will improve Outcome: Progressing   Problem: Coping: Goal: Ability to adjust to condition or change in health will improve Outcome: Progressing

## 2024-01-29 NOTE — Progress Notes (Signed)
 Palliative:  HPI: 58 y.o. male with past medical history of dementia, CVA w/ L hemiplegia and aphasia, HLD, HTN, DM2, PVD with right AKA, BPH with suprapubic catheter admitted from [facility] on 01/11/2024 with increasing AMS and sweating. Pt found with acute renal failure, hypernatremia, metabolic acidosis with normal anion gap, sepsis secondary to UTI with suprapubic catheter, acute metabolic encephalopathy. MRI (+) Acute lacunar infaracts involving L frontal, L posterior temporal/ parietal lobe and single acute lacunar infarct R frontal lobe.    I discussed with Dr. Vann. Ryan Hughes is no longer accepting of intake and refusing medications. Per nursing notes he has also been combative and refusing care at times. I met with Ryan Hughes but no family present. He is less interactive. He mostly just looked at me and then closed his eyes. I attempted to discuss his wishes for PEG but no response. I asked him if he wants to be just left alone so he can rest and he nods his head yes. I asked if he wants to be left alone to rest even if this means he will pass away but no response.   I called and discussed with niece/HCPOA [name]. I shared that he is now refusing all intake and medications. I discussed my assessment and conversation with him. I shared my concerns that his actions and non-verbal signs are an indication of his wishes. I explained that I am not sure he would tolerate PEG tube and that he may pull this out. We have discussed that this will not improve his quality of life. I shared with [name] that we need to make a choice of which path to take. I explained that given his progression and current status that focusing on his comfort would be very appropriate and recommended. [name] is accepting of conversation. She plans to come to bedside today and visit with Ryan Hughes herself. She is disappointed as she felt he was eating better when his diet was liberated and had not visited since Friday and was  unaware he had declined. She knows she has important decisions to make. I encouraged her to visit and consider her options and we will talk again tomorrow after she has time to visit with him.   All questions/concerns addressed. Emotional support provided.   Exam: More lethargic. He does awaken briefly but closes eyes back after a few moments. Not verbal today during my visit. Nods head yes only once. Sleeping a lot more. No distress. Breathing regular, unlabored. Abd soft. R AKA.   Plan: - HCPOA coming to bedside today to assess and consider goals of care - encouraged consideration of comfort care  40 min  Bernarda Kitty, NP Palliative Medicine Team Pager [phone] (Please see [email or web address] for schedule) Team Phone [phone]

## 2024-01-29 NOTE — Plan of Care (Signed)
  Problem: Education: Goal: Ability to describe self-care measures that may prevent or decrease complications (Diabetes Survival Skills Education) will improve Outcome: Progressing   Problem: Coping: Goal: Ability to adjust to condition or change in health will improve Outcome: Progressing   Problem: Fluid Volume: Goal: Ability to maintain a balanced intake and output will improve Outcome: Progressing   Problem: Health Behavior/Discharge Planning: Goal: Ability to identify and utilize available resources and services will improve Outcome: Progressing Goal: Ability to manage health-related needs will improve Outcome: Progressing   Problem: Metabolic: Goal: Ability to maintain appropriate glucose levels will improve Outcome: Progressing   Problem: Nutritional: Goal: Maintenance of adequate nutrition will improve Outcome: Progressing Goal: Progress toward achieving an optimal weight will improve Outcome: Progressing   Problem: Skin Integrity: Goal: Risk for impaired skin integrity will decrease Outcome: Progressing   Problem: Tissue Perfusion: Goal: Adequacy of tissue perfusion will improve Outcome: Progressing   Problem: Education: Goal: Knowledge of General Education information will improve Description: Including pain rating scale, medication(s)/side effects and non-pharmacologic comfort measures Outcome: Progressing   Problem: Health Behavior/Discharge Planning: Goal: Ability to manage health-related needs will improve Outcome: Progressing   Problem: Clinical Measurements: Goal: Ability to maintain clinical measurements within normal limits will improve Outcome: Progressing Goal: Will remain free from infection Outcome: Progressing Goal: Diagnostic test results will improve Outcome: Progressing Goal: Respiratory complications will improve Outcome: Progressing Goal: Cardiovascular complication will be avoided Outcome: Progressing   Problem: Activity: Goal:  Risk for activity intolerance will decrease Outcome: Progressing   Problem: Nutrition: Goal: Adequate nutrition will be maintained Outcome: Progressing   Problem: Coping: Goal: Level of anxiety will decrease Outcome: Progressing   Problem: Elimination: Goal: Will not experience complications related to bowel motility Outcome: Progressing Goal: Will not experience complications related to urinary retention Outcome: Progressing   Problem: Pain Managment: Goal: General experience of comfort will improve and/or be controlled Outcome: Progressing   Problem: Safety: Goal: Ability to remain free from injury will improve Outcome: Progressing   Problem: Skin Integrity: Goal: Risk for impaired skin integrity will decrease Outcome: Progressing   Problem: Education: Goal: Knowledge of disease or condition will improve Outcome: Progressing Goal: Knowledge of secondary prevention will improve (MUST DOCUMENT ALL) Outcome: Progressing Goal: Knowledge of patient specific risk factors will improve (DELETE if not current risk factor) Outcome: Progressing   Problem: Ischemic Stroke/TIA Tissue Perfusion: Goal: Complications of ischemic stroke/TIA will be minimized Outcome: Progressing   Problem: Coping: Goal: Will verbalize positive feelings about self Outcome: Progressing Goal: Will identify appropriate support needs Outcome: Progressing   Problem: Health Behavior/Discharge Planning: Goal: Ability to manage health-related needs will improve Outcome: Progressing Goal: Goals will be collaboratively established with patient/family Outcome: Progressing   Problem: Self-Care: Goal: Ability to participate in self-care as condition permits will improve Outcome: Progressing Goal: Verbalization of feelings and concerns over difficulty with self-care will improve Outcome: Progressing Goal: Ability to communicate needs accurately will improve Outcome: Progressing   Problem:  Nutrition: Goal: Risk of aspiration will decrease Outcome: Progressing Goal: Dietary intake will improve Outcome: Progressing

## 2024-01-29 NOTE — Plan of Care (Signed)
 Problem: Education: Goal: Ability to describe self-care measures that may prevent or decrease complications (Diabetes Survival Skills Education) will improve 01/29/2024 0444 by Ryan Frederick PARAS, RN Outcome: Progressing 01/29/2024 0442 by Ryan Frederick PARAS, RN Outcome: Progressing   Problem: Coping: Goal: Ability to adjust to condition or change in health will improve 01/29/2024 0444 by Ryan Frederick PARAS, RN Outcome: Progressing 01/29/2024 0442 by Ryan Frederick PARAS, RN Outcome: Progressing   Problem: Fluid Volume: Goal: Ability to maintain a balanced intake and output will improve 01/29/2024 0444 by Ryan Frederick PARAS, RN Outcome: Progressing 01/29/2024 0442 by Ryan Frederick PARAS, RN Outcome: Progressing   Problem: Health Behavior/Discharge Planning: Goal: Ability to identify and utilize available resources and services will improve 01/29/2024 0444 by Ryan Frederick PARAS, RN Outcome: Progressing 01/29/2024 0442 by Ryan Frederick PARAS, RN Outcome: Progressing Goal: Ability to manage health-related needs will improve 01/29/2024 0444 by Ryan Frederick PARAS, RN Outcome: Progressing 01/29/2024 0442 by Ryan Frederick PARAS, RN Outcome: Progressing   Problem: Metabolic: Goal: Ability to maintain appropriate glucose levels will improve 01/29/2024 0444 by Ryan Frederick PARAS, RN Outcome: Progressing 01/29/2024 0442 by Ryan Frederick PARAS, RN Outcome: Progressing   Problem: Nutritional: Goal: Maintenance of adequate nutrition will improve 01/29/2024 0444 by Ryan Frederick PARAS, RN Outcome: Progressing 01/29/2024 0442 by Ryan Frederick PARAS, RN Outcome: Progressing Goal: Progress toward achieving an optimal weight will improve 01/29/2024 0444 by Ryan Frederick PARAS, RN Outcome: Progressing 01/29/2024 0442 by Ryan Frederick PARAS, RN Outcome: Progressing   Problem: Skin Integrity: Goal: Risk for impaired skin integrity will decrease 01/29/2024 0444 by Ryan Frederick PARAS, RN Outcome:  Progressing 01/29/2024 0442 by Ryan Frederick PARAS, RN Outcome: Progressing   Problem: Tissue Perfusion: Goal: Adequacy of tissue perfusion will improve 01/29/2024 0444 by Ryan Frederick PARAS, RN Outcome: Progressing 01/29/2024 0442 by Ryan Frederick PARAS, RN Outcome: Progressing   Problem: Education: Goal: Knowledge of General Education information will improve Description: Including pain rating scale, medication(s)/side effects and non-pharmacologic comfort measures 01/29/2024 0444 by Ryan Frederick PARAS, RN Outcome: Progressing 01/29/2024 0442 by Ryan Frederick PARAS, RN Outcome: Progressing   Problem: Health Behavior/Discharge Planning: Goal: Ability to manage health-related needs will improve 01/29/2024 0444 by Ryan Frederick PARAS, RN Outcome: Progressing 01/29/2024 0442 by Ryan Frederick PARAS, RN Outcome: Progressing   Problem: Clinical Measurements: Goal: Ability to maintain clinical measurements within normal limits will improve 01/29/2024 0444 by Ryan Frederick PARAS, RN Outcome: Progressing 01/29/2024 0442 by Ryan Frederick PARAS, RN Outcome: Progressing Goal: Will remain free from infection 01/29/2024 0444 by Ryan Frederick PARAS, RN Outcome: Progressing 01/29/2024 0442 by Ryan Frederick PARAS, RN Outcome: Progressing Goal: Diagnostic test results will improve 01/29/2024 0444 by Ryan Frederick PARAS, RN Outcome: Progressing 01/29/2024 0442 by Ryan Frederick PARAS, RN Outcome: Progressing Goal: Respiratory complications will improve 01/29/2024 0444 by Ryan Frederick PARAS, RN Outcome: Progressing 01/29/2024 0442 by Ryan Frederick PARAS, RN Outcome: Progressing Goal: Cardiovascular complication will be avoided 01/29/2024 0444 by Ryan Frederick PARAS, RN Outcome: Progressing 01/29/2024 0442 by Ryan Frederick PARAS, RN Outcome: Progressing   Problem: Activity: Goal: Risk for activity intolerance will decrease 01/29/2024 0444 by Ryan Frederick PARAS, RN Outcome: Progressing 01/29/2024 0442 by Ryan Frederick PARAS, RN Outcome: Progressing   Problem: Nutrition: Goal: Adequate nutrition will be maintained 01/29/2024 0444 by Ryan Frederick PARAS, RN Outcome: Progressing 01/29/2024 0442 by Ryan Frederick PARAS, RN Outcome: Progressing   Problem: Coping: Goal: Level of anxiety will decrease 01/29/2024 0444 by Ryan Frederick PARAS, RN Outcome: Progressing 01/29/2024 0442 by Ryan  Frederick PARAS, RN Outcome: Progressing   Problem: Elimination: Goal: Will not experience complications related to bowel motility 01/29/2024 0444 by Ryan Frederick PARAS, RN Outcome: Progressing 01/29/2024 0442 by Ryan Frederick PARAS, RN Outcome: Progressing Goal: Will not experience complications related to urinary retention 01/29/2024 0444 by Ryan Frederick PARAS, RN Outcome: Progressing 01/29/2024 0442 by Ryan Frederick PARAS, RN Outcome: Progressing   Problem: Pain Managment: Goal: General experience of comfort will improve and/or be controlled 01/29/2024 0444 by Ryan Frederick PARAS, RN Outcome: Progressing 01/29/2024 0442 by Ryan Frederick PARAS, RN Outcome: Progressing   Problem: Safety: Goal: Ability to remain free from injury will improve 01/29/2024 0444 by Ryan Frederick PARAS, RN Outcome: Progressing 01/29/2024 0442 by Ryan Frederick PARAS, RN Outcome: Progressing   Problem: Skin Integrity: Goal: Risk for impaired skin integrity will decrease 01/29/2024 0444 by Ryan Frederick PARAS, RN Outcome: Progressing 01/29/2024 0442 by Ryan Frederick PARAS, RN Outcome: Progressing   Problem: Education: Goal: Knowledge of disease or condition will improve 01/29/2024 0444 by Ryan Frederick PARAS, RN Outcome: Progressing 01/29/2024 0442 by Ryan Frederick PARAS, RN Outcome: Progressing Goal: Knowledge of secondary prevention will improve (MUST DOCUMENT ALL) 01/29/2024 0444 by Ryan Frederick PARAS, RN Outcome: Progressing 01/29/2024 0442 by Ryan Frederick PARAS, RN Outcome: Progressing Goal: Knowledge of patient specific risk factors will  improve (DELETE if not current risk factor) 01/29/2024 0444 by Ryan Frederick PARAS, RN Outcome: Progressing 01/29/2024 0442 by Ryan Frederick PARAS, RN Outcome: Progressing   Problem: Ischemic Stroke/TIA Tissue Perfusion: Goal: Complications of ischemic stroke/TIA will be minimized 01/29/2024 0444 by Ryan Frederick PARAS, RN Outcome: Progressing 01/29/2024 0442 by Ryan Frederick PARAS, RN Outcome: Progressing   Problem: Coping: Goal: Will verbalize positive feelings about self 01/29/2024 0444 by Ryan Frederick PARAS, RN Outcome: Progressing 01/29/2024 0442 by Ryan Frederick PARAS, RN Outcome: Progressing Goal: Will identify appropriate support needs 01/29/2024 0444 by Ryan Frederick PARAS, RN Outcome: Progressing 01/29/2024 0442 by Ryan Frederick PARAS, RN Outcome: Progressing   Problem: Health Behavior/Discharge Planning: Goal: Ability to manage health-related needs will improve 01/29/2024 0444 by Ryan Frederick PARAS, RN Outcome: Progressing 01/29/2024 0442 by Ryan Frederick PARAS, RN Outcome: Progressing Goal: Goals will be collaboratively established with patient/family 01/29/2024 0444 by Ryan Frederick PARAS, RN Outcome: Progressing 01/29/2024 0442 by Ryan Frederick PARAS, RN Outcome: Progressing   Problem: Self-Care: Goal: Ability to participate in self-care as condition permits will improve 01/29/2024 0444 by Ryan Frederick PARAS, RN Outcome: Progressing 01/29/2024 0442 by Ryan Frederick PARAS, RN Outcome: Progressing Goal: Verbalization of feelings and concerns over difficulty with self-care will improve 01/29/2024 0444 by Ryan Frederick PARAS, RN Outcome: Progressing 01/29/2024 0442 by Ryan Frederick PARAS, RN Outcome: Progressing Goal: Ability to communicate needs accurately will improve 01/29/2024 0444 by Ryan Frederick PARAS, RN Outcome: Progressing 01/29/2024 0442 by Ryan Frederick PARAS, RN Outcome: Progressing   Problem: Nutrition: Goal: Risk of aspiration will decrease 01/29/2024 0444 by Ryan Frederick PARAS, RN Outcome: Progressing 01/29/2024 0442 by Ryan Frederick PARAS, RN Outcome: Progressing Goal: Dietary intake will improve 01/29/2024 0444 by Ryan Frederick PARAS, RN Outcome: Progressing 01/29/2024 0442 by Ryan Frederick PARAS, RN Outcome: Progressing

## 2024-01-29 NOTE — Progress Notes (Signed)
 PT Cancellation Note and Discharge  Patient Details Name: Ryan Hughes MRN: [id number] DOB: Sep 07, 1965   Cancelled Treatment:    Reason Eval/Treat Not Completed: (P) Patient declined, no reason specified. Pt adamantly refused therapy today, and when asked if we should come back he said No  Pt not eating and not taking medication. Palliative is working with pt on goals of care. PT signing off. Reconsult PT if pt participation levels improve.  Huntley Knoop B. Fleeta Lapidus PT, DPT Acute Rehabilitation Services Please use secure chat or  Call Office [phone]    Almarie KATHEE Fleeta [facility] 01/29/2024, 3:07 PM

## 2024-01-29 NOTE — Progress Notes (Signed)
 Pt in room in bed at this time. Pt became very sexually inappropriate with this Clinical research associate after taking his medication. Pt states Are you ready to fuck now? This Clinical research associate then began asking Pt orientation questions again to gauge cognition, and Pt answered questions appropriately. Pt was then reminded by this writer that he was a Pt and that his language was inappropriate. Pt started to grope himself, and stated, Please let me stick it in, I can make you moan. This Clinical research associate began taking off PPE to leave the room, and upon removing gown, Pt attempted to grab this writers breasts. Upon exiting room Charge Nurse who is male was made aware of Pt's behavior, and brought back to Pt's room to speak with him in hopes of deterring this behavior, and upon entering with Charge Nurse Pt was still groping his genitalia, and states I'm still waiting on you. Charge Nurse spoke with Pt and Pt states understanding but was observed doing the same thing to another staff member. Charge to report to oncoming shift to further evaluate.

## 2024-01-30 DIAGNOSIS — Z7189 Other specified counseling: Secondary | ICD-10-CM | POA: Diagnosis not present

## 2024-01-30 DIAGNOSIS — N179 Acute kidney failure, unspecified: Secondary | ICD-10-CM | POA: Diagnosis not present

## 2024-01-30 DIAGNOSIS — E861 Hypovolemia: Secondary | ICD-10-CM | POA: Diagnosis not present

## 2024-01-30 DIAGNOSIS — R627 Adult failure to thrive: Secondary | ICD-10-CM | POA: Diagnosis not present

## 2024-01-30 DIAGNOSIS — Z515 Encounter for palliative care: Secondary | ICD-10-CM | POA: Diagnosis not present

## 2024-01-30 DIAGNOSIS — E870 Hyperosmolality and hypernatremia: Secondary | ICD-10-CM | POA: Diagnosis not present

## 2024-01-30 LAB — GLUCOSE, CAPILLARY
Glucose-Capillary: 157 mg/dL — ABNORMAL HIGH (ref 70–99)
Glucose-Capillary: 110 mg/dL — ABNORMAL HIGH (ref 70–99)
Glucose-Capillary: 197 mg/dL — ABNORMAL HIGH (ref 70–99)
Glucose-Capillary: 101 mg/dL — ABNORMAL HIGH (ref 70–99)

## 2024-01-30 NOTE — Plan of Care (Signed)
  Problem: Education: Goal: Ability to describe self-care measures that may prevent or decrease complications (Diabetes Survival Skills Education) will improve Outcome: Not Progressing   Problem: Coping: Goal: Ability to adjust to condition or change in health will improve Outcome: Not Progressing   Problem: Fluid Volume: Goal: Ability to maintain a balanced intake and output will improve Outcome: Not Progressing   Problem: Health Behavior/Discharge Planning: Goal: Ability to identify and utilize available resources and services will improve Outcome: Not Progressing Goal: Ability to manage health-related needs will improve Outcome: Not Progressing   Problem: Metabolic: Goal: Ability to maintain appropriate glucose levels will improve Outcome: Not Progressing   Problem: Nutritional: Goal: Maintenance of adequate nutrition will improve Outcome: Not Progressing Goal: Progress toward achieving an optimal weight will improve Outcome: Not Progressing   Problem: Skin Integrity: Goal: Risk for impaired skin integrity will decrease Outcome: Not Progressing   Problem: Tissue Perfusion: Goal: Adequacy of tissue perfusion will improve Outcome: Not Progressing   Problem: Education: Goal: Knowledge of General Education information will improve Description: Including pain rating scale, medication(s)/side effects and non-pharmacologic comfort measures Outcome: Not Progressing   Problem: Health Behavior/Discharge Planning: Goal: Ability to manage health-related needs will improve Outcome: Not Progressing   Problem: Clinical Measurements: Goal: Ability to maintain clinical measurements within normal limits will improve Outcome: Not Progressing Goal: Will remain free from infection Outcome: Not Progressing Goal: Diagnostic test results will improve Outcome: Not Progressing Goal: Respiratory complications will improve Outcome: Not Progressing Goal: Cardiovascular complication will  be avoided Outcome: Not Progressing   Problem: Activity: Goal: Risk for activity intolerance will decrease Outcome: Not Progressing   Problem: Nutrition: Goal: Adequate nutrition will be maintained Outcome: Not Progressing   Problem: Coping: Goal: Level of anxiety will decrease Outcome: Not Progressing   Problem: Elimination: Goal: Will not experience complications related to bowel motility Outcome: Not Progressing Goal: Will not experience complications related to urinary retention Outcome: Not Progressing   Problem: Pain Managment: Goal: General experience of comfort will improve and/or be controlled Outcome: Not Progressing   Problem: Safety: Goal: Ability to remain free from injury will improve Outcome: Not Progressing   Problem: Skin Integrity: Goal: Risk for impaired skin integrity will decrease Outcome: Not Progressing   Problem: Education: Goal: Knowledge of disease or condition will improve Outcome: Not Progressing Goal: Knowledge of secondary prevention will improve (MUST DOCUMENT ALL) Outcome: Not Progressing Goal: Knowledge of patient specific risk factors will improve (DELETE if not current risk factor) Outcome: Not Progressing   Problem: Ischemic Stroke/TIA Tissue Perfusion: Goal: Complications of ischemic stroke/TIA will be minimized Outcome: Not Progressing   Problem: Coping: Goal: Will verbalize positive feelings about self Outcome: Not Progressing Goal: Will identify appropriate support needs Outcome: Not Progressing   Problem: Health Behavior/Discharge Planning: Goal: Ability to manage health-related needs will improve Outcome: Not Progressing Goal: Goals will be collaboratively established with patient/family Outcome: Not Progressing   Problem: Self-Care: Goal: Ability to participate in self-care as condition permits will improve Outcome: Not Progressing Goal: Verbalization of feelings and concerns over difficulty with self-care will  improve Outcome: Not Progressing Goal: Ability to communicate needs accurately will improve Outcome: Not Progressing   Problem: Nutrition: Goal: Risk of aspiration will decrease Outcome: Not Progressing Goal: Dietary intake will improve Outcome: Not Progressing

## 2024-01-30 NOTE — Progress Notes (Signed)
   01/30/24 0900  Note  Patient Observations (S)  Patient refused full assessment, medications, and care. Patient became physical with staff while cleaning up. patient accepted water , juice, and sausage patty.

## 2024-01-30 NOTE — Plan of Care (Signed)
 Problem: Education: Goal: Ability to describe self-care measures that may prevent or decrease complications (Diabetes Survival Skills Education) will improve 01/30/2024 0143 by Ryan Hughes ORN, RN Outcome: Not Progressing 01/30/2024 0139 by Ryan Hughes ORN, RN Outcome: Not Progressing   Problem: Coping: Goal: Ability to adjust to condition or change in health will improve 01/30/2024 0143 by Ryan Hughes ORN, RN Outcome: Not Progressing 01/30/2024 0139 by Ryan Hughes ORN, RN Outcome: Not Progressing   Problem: Fluid Volume: Goal: Ability to maintain a balanced intake and output will improve 01/30/2024 0143 by Ryan Hughes ORN, RN Outcome: Not Progressing 01/30/2024 0139 by Ryan Hughes ORN, RN Outcome: Not Progressing   Problem: Health Behavior/Discharge Planning: Goal: Ability to identify and utilize available resources and services will improve 01/30/2024 0143 by Ryan Hughes ORN, RN Outcome: Not Progressing 01/30/2024 0139 by Ryan Hughes ORN, RN Outcome: Not Progressing Goal: Ability to manage health-related needs will improve 01/30/2024 0143 by Ryan Hughes ORN, RN Outcome: Not Progressing 01/30/2024 0139 by Ryan Hughes ORN, RN Outcome: Not Progressing   Problem: Metabolic: Goal: Ability to maintain appropriate glucose levels will improve 01/30/2024 0143 by Ryan Hughes ORN, RN Outcome: Not Progressing 01/30/2024 0139 by Ryan Hughes ORN, RN Outcome: Not Progressing   Problem: Nutritional: Goal: Maintenance of adequate nutrition will improve 01/30/2024 0143 by Ryan Hughes ORN, RN Outcome: Not Progressing 01/30/2024 0139 by Ryan Hughes ORN, RN Outcome: Not Progressing Goal: Progress toward achieving an optimal weight will improve 01/30/2024 0143 by Ryan Hughes ORN, RN Outcome: Not Progressing 01/30/2024 0139 by Ryan Hughes ORN, RN Outcome: Not Progressing   Problem: Skin Integrity: Goal: Risk for impaired skin integrity will decrease 01/30/2024 0143  by Ryan Hughes ORN, RN Outcome: Not Progressing 01/30/2024 0139 by Ryan Hughes ORN, RN Outcome: Not Progressing   Problem: Tissue Perfusion: Goal: Adequacy of tissue perfusion will improve 01/30/2024 0143 by Ryan Hughes ORN, RN Outcome: Not Progressing 01/30/2024 0139 by Ryan Hughes ORN, RN Outcome: Not Progressing   Problem: Education: Goal: Knowledge of General Education information will improve Description: Including pain rating scale, medication(s)/side effects and non-pharmacologic comfort measures 01/30/2024 0143 by Ryan Hughes ORN, RN Outcome: Not Progressing 01/30/2024 0139 by Ryan Hughes ORN, RN Outcome: Not Progressing   Problem: Health Behavior/Discharge Planning: Goal: Ability to manage health-related needs will improve 01/30/2024 0143 by Ryan Hughes ORN, RN Outcome: Not Progressing 01/30/2024 0139 by Ryan Hughes ORN, RN Outcome: Not Progressing   Problem: Clinical Measurements: Goal: Ability to maintain clinical measurements within normal limits will improve 01/30/2024 0143 by Ryan Hughes ORN, RN Outcome: Not Progressing 01/30/2024 0139 by Ryan Hughes ORN, RN Outcome: Not Progressing Goal: Will remain free from infection 01/30/2024 0143 by Ryan Hughes ORN, RN Outcome: Not Progressing 01/30/2024 0139 by Ryan Hughes ORN, RN Outcome: Not Progressing Goal: Diagnostic test results will improve 01/30/2024 0143 by Ryan Hughes ORN, RN Outcome: Not Progressing 01/30/2024 0139 by Ryan Hughes ORN, RN Outcome: Not Progressing Goal: Respiratory complications will improve 01/30/2024 0143 by Ryan Hughes ORN, RN Outcome: Not Progressing 01/30/2024 0139 by Ryan Hughes ORN, RN Outcome: Not Progressing Goal: Cardiovascular complication will be avoided 01/30/2024 0143 by Ryan Hughes ORN, RN Outcome: Not Progressing 01/30/2024 0139 by Ryan Hughes ORN, RN Outcome: Not Progressing   Problem: Activity: Goal: Risk for activity intolerance will  decrease 01/30/2024 0143 by Ryan Hughes ORN, RN Outcome: Not Progressing 01/30/2024 0139 by Ryan Hughes ORN, RN Outcome: Not Progressing   Problem: Nutrition: Goal: Adequate nutrition will be maintained 01/30/2024 0143 by Ryan Hughes  W, RN Outcome: Not Progressing 01/30/2024 0139 by Ryan Hughes ORN, RN Outcome: Not Progressing   Problem: Coping: Goal: Level of anxiety will decrease 01/30/2024 0143 by Ryan Hughes ORN, RN Outcome: Not Progressing 01/30/2024 0139 by Ryan Hughes ORN, RN Outcome: Not Progressing   Problem: Elimination: Goal: Will not experience complications related to bowel motility 01/30/2024 0143 by Ryan Hughes ORN, RN Outcome: Not Progressing 01/30/2024 0139 by Ryan Hughes ORN, RN Outcome: Not Progressing Goal: Will not experience complications related to urinary retention 01/30/2024 0143 by Ryan Hughes ORN, RN Outcome: Not Progressing 01/30/2024 0139 by Ryan Hughes ORN, RN Outcome: Not Progressing   Problem: Pain Managment: Goal: General experience of comfort will improve and/or be controlled 01/30/2024 0143 by Ryan Hughes ORN, RN Outcome: Not Progressing 01/30/2024 0139 by Ryan Hughes ORN, RN Outcome: Not Progressing   Problem: Safety: Goal: Ability to remain free from injury will improve 01/30/2024 0143 by Ryan Hughes ORN, RN Outcome: Not Progressing 01/30/2024 0139 by Ryan Hughes ORN, RN Outcome: Not Progressing   Problem: Skin Integrity: Goal: Risk for impaired skin integrity will decrease 01/30/2024 0143 by Ryan Hughes ORN, RN Outcome: Not Progressing 01/30/2024 0139 by Ryan Hughes ORN, RN Outcome: Not Progressing   Problem: Education: Goal: Knowledge of disease or condition will improve 01/30/2024 0143 by Ryan Hughes ORN, RN Outcome: Not Progressing 01/30/2024 0139 by Ryan Hughes ORN, RN Outcome: Not Progressing Goal: Knowledge of secondary prevention will improve (MUST DOCUMENT ALL) 01/30/2024 0143 by Ryan Hughes ORN, RN Outcome: Not Progressing 01/30/2024 0139 by Ryan Hughes ORN, RN Outcome: Not Progressing Goal: Knowledge of patient specific risk factors will improve (DELETE if not current risk factor) 01/30/2024 0143 by Ryan Hughes ORN, RN Outcome: Not Progressing 01/30/2024 0139 by Ryan Hughes ORN, RN Outcome: Not Progressing   Problem: Ischemic Stroke/TIA Tissue Perfusion: Goal: Complications of ischemic stroke/TIA will be minimized 01/30/2024 0143 by Ryan Hughes ORN, RN Outcome: Not Progressing 01/30/2024 0139 by Ryan Hughes ORN, RN Outcome: Not Progressing   Problem: Coping: Goal: Will verbalize positive feelings about self 01/30/2024 0143 by Ryan Hughes ORN, RN Outcome: Not Progressing 01/30/2024 0139 by Ryan Hughes ORN, RN Outcome: Not Progressing Goal: Will identify appropriate support needs 01/30/2024 0143 by Ryan Hughes ORN, RN Outcome: Not Progressing 01/30/2024 0139 by Ryan Hughes ORN, RN Outcome: Not Progressing   Problem: Health Behavior/Discharge Planning: Goal: Ability to manage health-related needs will improve 01/30/2024 0143 by Ryan Hughes ORN, RN Outcome: Not Progressing 01/30/2024 0139 by Ryan Hughes ORN, RN Outcome: Not Progressing Goal: Goals will be collaboratively established with patient/family 01/30/2024 0143 by Ryan Hughes ORN, RN Outcome: Not Progressing 01/30/2024 0139 by Ryan Hughes ORN, RN Outcome: Not Progressing   Problem: Self-Care: Goal: Ability to participate in self-care as condition permits will improve 01/30/2024 0143 by Ryan Hughes ORN, RN Outcome: Not Progressing 01/30/2024 0139 by Ryan Hughes ORN, RN Outcome: Not Progressing Goal: Verbalization of feelings and concerns over difficulty with self-care will improve 01/30/2024 0143 by Ryan Hughes ORN, RN Outcome: Not Progressing 01/30/2024 0139 by Ryan Hughes ORN, RN Outcome: Not Progressing Goal: Ability to communicate needs accurately will improve 01/30/2024  0143 by Ryan Hughes ORN, RN Outcome: Not Progressing 01/30/2024 0139 by Ryan Hughes ORN, RN Outcome: Not Progressing   Problem: Nutrition: Goal: Risk of aspiration will decrease 01/30/2024 0143 by Ryan Hughes ORN, RN Outcome: Not Progressing 01/30/2024 0139 by Ryan Hughes ORN, RN Outcome: Not Progressing Goal: Dietary intake will improve 01/30/2024 0143 by Ryan Hughes ORN, RN Outcome: Not  Progressing 01/30/2024 0139 by Ryan Hughes ORN, RN Outcome: Not Progressing   Problem: Education: Goal: Knowledge of disease or condition will improve Outcome: Not Progressing Goal: Knowledge of secondary prevention will improve (MUST DOCUMENT ALL) Outcome: Not Progressing Goal: Knowledge of patient specific risk factors will improve (DELETE if not current risk factor) Outcome: Not Progressing

## 2024-01-30 NOTE — Progress Notes (Signed)
 Family at bedside assisting with feeding. Patient cooperative and medications able to be given. Ryan Hughes

## 2024-01-30 NOTE — Progress Notes (Signed)
 Palliative:  HPI: 58 y.o. male with past medical history of dementia, CVA w/ L hemiplegia and aphasia, HLD, HTN, DM2, PVD with right AKA, BPH with suprapubic catheter admitted from [facility] on 01/11/2024 with increasing AMS and sweating. Pt found with acute renal failure, hypernatremia, metabolic acidosis with normal anion gap, sepsis secondary to UTI with suprapubic catheter, acute metabolic encephalopathy. MRI (+) Acute lacunar infaracts involving L frontal, L posterior temporal/ parietal lobe and single acute lacunar infarct R frontal lobe.   I met today at [facility] bedside. He is awake and tracking. He does not verbalize or nod his head for me today. He often closes his eyes. I offered him a bite of his omelette at bedside but he shut his mouth tightly. I offered him a bite of french toast and he shut his mouth tightly. Noted that he ate a piece of sausage and had some liquids. RN reports refusing all medications and has been combative when staff trying to care for him. He has not been combative or inappropriate during any of my visits just less interactive than previously.   I called and discussed with niece/HCPOA [name]. [name] shares that Marcey ate some for her husband last night. She does report he seemed less interactive than previously but that he would nod his head yes/no for them but not verbal. I shared that he has consistently been refusing food and medications for staff. We discussed the limited options we have to make this better and treat him. We discussed risks of PEG that he may pull out and that it would likely give him diarrhea and further irritate and infect his sacral wound. We discussed that PEG tube is not recommended and that this would not add to his quality of life. I further discussed with [name] that Marcey is no longer giving any indications of his wishes and that we should follow his directive which indicates he would not desire resuscitation and would not desire  artificial nutrition if risks outweighed benefits. I explained that our recommendation would be DNR, no PEG, and comfort focused care with the help of hospice given his actions and refusal of medications/care (what is he trying to tell us  by refusing?) and his written directive. [name] understands and shares that she agrees with me but needs another day before making any changes. We discuss hospice and that he is likely not yet eligible for hospice facility and would be more long term care facility with additional support from hospice. She would like to know what options she may have for long term care facility - I will engage CSW. We agree to talk again tomorrow by phone 1 pm. I offer family meeting or any other way I can assist her in the decision making process - she declines family meeting and shares that she will do what is best for her uncle regardless of other family member opinions. I encouraged her to let me know anything else I can do to help her in the decision making process.   All questions/concerns addressed. Emotional support provided. Discussed with Dr. Juvenal, RN, TOC.   Exam: More lethargic. He does awaken briefly but closes eyes back after a few moments. Not verbal today during my visit. Nods head yes only once. Sleeping a lot more. No distress. Breathing regular, unlabored. Abd soft. R AKA.   Plan: - Follow up GOC conversation with HCPOA 9/24 1pm - Recommend CSW f/u for LTC options per [facility] request  50 min  Shirleymae Hauth  Kennyth, NP Palliative Medicine Team Pager [phone] (Please see [email or web address] for schedule) Team Phone [phone]

## 2024-01-30 NOTE — Progress Notes (Signed)
 [facility]                                                                              Ryan Hughes, is a 58 y.o. male, DOB - 11-26-1965, FMW:[id number] Admit date - 01/11/2024    Outpatient Primary MD for the patient is [name], MD  LOS - 19  days  Chief Complaint  Patient presents with   Altered Mental Status   Hypotension       Brief summary   58/M with advanced dementia from SNF, also has history of CVA, depression, type 2 diabetes mellitus, peripheral vascular disease, right AKA, wheelchair-bound, BPH with suprapubic catheter was brought to the ED with altered mental status, worsening confusion, minimal oral intake . - Admitted, treated for UTI, AKI, hyponatremia, stroke and dysphagia--diet has been advanced -Palliative care following, goals of care discussions ongoing Patient continues to refuse all p.o. intake as well as intermittent refusal of medications.  Palliative care continues to work with family   Subjective:   Patient becomes physical with staff when they attempt to do basic care  Assessment & Plan     Acute kidney injury, uremia, normal anion gap metabolic acidosis  Acute hypernatremia -Presented with confusion/AMS superimposed on dementia - Workup showed serum creatinine of 4, sodium of 162, bicarb 19. - Unremarkable US  renal. - Suprapubic catheter -changed in the ER, functioning - Creatinine has improved, 1.0 at baseline   Sepsis secondary to UTI due to chronic indwelling catheter Chronic suprapubic catheter  BPH -History of chronic suprapubic catheter, changed in ED.   -Presented with hypotension, leukocytosis, tachypnea with potential source of infection in the urine. -Urine culture grew multiple organisms, completed IV Unasyn   Acute on chronic normocytic anemia, UGIB - Baseline hemoglobin appears to be ~8 -Hemoglobin 6.8 on 9/10, received 1 unit packed RBCs.  - Has been off of Brilinta  since 9/8, on aspirin .  Per RN,  had black stools, FOBT positive.  GI was consulted.  They recommended supportive care and goals of care discussions as risk of procedures felt to be too high -Brilinta  eventually resumed 9/13 considering stable hemoglobin and no further active bleeding-continue to monitor  Intractable nausea and vomiting, abdominal discomfort. Constipation Dysphagia Nutrition/poor p.o. intake, advanced dementia, nonverbal -X-ray abdomen shows evidence of concern for ileus versus small bowel obstruction. -CT abdomen 9/8 showed no evidence of bowel obstruction.  Distal esophageal wall thickening suggesting chronic reflux/esophagitis.  Mild bladder wall thickening.   - Nausea and vomiting appears to have resolved - Prognosis is poor, palliative care following, our recommendations would be for hospice-family currently having hard time accepting If family desires to pursue pursuing PEG tube high risk patient would remove said tube - Trial of Remeron   Acute metabolic encephalopathy superimposed on dementia without behavioral disturbance  Unwitnessed fall Acute CVA with prior history of CVA -Metabolic workup showed normal ammonia, TSH, B1, B12, folic acid . CT head negative for any acute stroke. -MRI shows multiple acute lacunar stroke.  Neurology was consulted. - Was on aspirin  and Plavix .  Now on aspirin  and Brilinta  per neurology recommendation.  Carotid Doppler negative. -SLP PT OT following. -  Awake and alert now however largely nonverbal - Brilinta  resumed, H&H stable - Continue goals of care discussions  Essential hypertension - Out of window for permissive hypertension for stroke, resume Coreg  12.5 mg twice daily, started amlodipine  5 mg daily  Hypokalemia, hypophosphatemia - Replace as needed   Type 2 diabetes mellitus with vascular disease (HCC) On sliding scale insulin  and metformin  and Trulicity . For now continuing only sliding scale.  PAD, history of AKA, right Was on aspirin  Plavix .  Now on  aspirin  and Brilinta .     Elevated troponin Likely in the setting of poor clearance due to AKI rather than demand ischemia. No evidence of EKG evidence of ischemia. 2D echo shows EF 75%.  No valvular abnormality.  Moderate protein calorie malnutrition Nutrition Problem: Moderate Malnutrition Etiology: chronic illness (dementia) Signs/Symptoms: mild fat depletion, mild muscle depletion Interventions: Nepro shake - P.o. intake remains poor, palliative medicine following   Obesity class I Estimated body mass index is 31.96 kg/m as calculated from the following:   Height as of this encounter: 5' 6 (1.676 m).   Weight as of this encounter: 89.8 kg.  Nonhealing sacral wound, stage III, POA - Wound care per nursing   Patient continues to refuse p.o. meds and food-family needs to decide on PEG versus comfort    Code Status: Full code DVT Prophylaxis:     Level of Care: Level of care: Med-Surg Family Communication: No family member at the bedside Disposition Plan:      Remains inpatient appropriate:        Consultants:   Palliative care GI Neurology      Medications   stroke: early stages of recovery book   Does not apply Once   sodium chloride    Intravenous Once   amLODipine   5 mg Oral Daily   aspirin  EC  81 mg Oral Daily   atorvastatin   80 mg Oral QHS   carvedilol   12.5 mg Oral BID WC   dutasteride   0.5 mg Oral Daily   feeding supplement (NEPRO CARB STEADY)  237 mL Oral TID BM   insulin  aspart  0-15 Units Subcutaneous TID WC   insulin  aspart  0-5 Units Subcutaneous QHS   leptospermum manuka honey  1 Application Topical Daily   mirtazapine   15 mg Oral QHS   mouth rinse  15 mL Mouth Rinse 4 times per day   pantoprazole   40 mg Oral BID   PARoxetine   10 mg Oral Daily   sucralfate   1 g Oral TID WC & HS   tamsulosin   0.8 mg Oral Daily   ticagrelor   90 mg Oral BID     Objective:   Vitals:   01/29/24 2200 01/30/24 0100 01/30/24 0415 01/30/24 0826  BP:  112/73 (!) 118/56 (!) 110/58 131/66  Pulse: 95 66  86  Resp: 18 17 19 19   Temp: 98.8 F (37.1 C) 97.6 F (36.4 C) 98 F (36.7 C) 98 F (36.7 C)  TempSrc:    Oral  SpO2: 100% 98% 100% 99%  Weight:      Height:        Intake/Output Summary (Last 24 hours) at 01/30/2024 1043 Last data filed at 01/29/2024 2200 Gross per 24 hour  Intake 118 ml  Output 450 ml  Net -332 ml       Wt Readings from Last 3 Encounters:  01/11/24 89.8 kg  10/02/23 90 kg  10/02/23 90 kg   In bed, does not respond to verbal cues  Data Reviewed:  I have personally reviewed following labs    CBC Lab Results  Component Value Date   WBC 6.3 01/29/2024   RBC 3.00 (L) 01/29/2024   HGB 9.2 (L) 01/29/2024   HCT 28.0 (L) 01/29/2024   MCV 93.3 01/29/2024   MCH 30.7 01/29/2024   PLT 364 01/29/2024   MCHC 32.9 01/29/2024   RDW 14.3 01/29/2024   LYMPHSABS 2.4 01/15/2024   MONOABS 0.7 01/15/2024   EOSABS 0.2 01/15/2024   BASOSABS 0.0 01/15/2024     Last metabolic panel Lab Results  Component Value Date   NA 141 01/29/2024   K 3.7 01/29/2024   CL 106 01/29/2024   CO2 25 01/29/2024   BUN 10 01/29/2024   CREATININE 1.27 (H) 01/29/2024   GLUCOSE 108 (H) 01/29/2024   GFRNONAA >60 01/29/2024   GFRAA >60 03/17/2019   CALCIUM  8.8 (L) 01/29/2024   PHOS 2.3 (L) 01/24/2024   PROT 6.5 01/11/2024   ALBUMIN 2.1 (L) 01/24/2024   LABGLOB 2.3 12/08/2021   AGRATIO 2.2 12/08/2021   BILITOT 0.5 01/11/2024   ALKPHOS 74 01/11/2024   AST 19 01/11/2024   ALT 22 01/11/2024   ANIONGAP 10 01/29/2024    CBG (last 3)  Recent Labs    01/29/24 1207 01/29/24 2157 01/30/24 0828  GLUCAP 98 174* 157*      Coagulation Profile: No results for input(s): INR, PROTIME in the last 168 hours.   Radiology Studies: I have personally reviewed the imaging studies  No results found.      Ryan RAYMOND Bowl DO [facility] 01/30/2024, 10:43 AM  Available via Epic secure chat 7am-7pm After 7 pm, please  refer to night coverage provider listed on amion.

## 2024-01-30 NOTE — Plan of Care (Signed)
 Problem: Education: Goal: Ability to describe self-care measures that may prevent or decrease complications (Diabetes Survival Skills Education) will improve Outcome: Progressing   Problem: Coping: Goal: Ability to adjust to condition or change in health will improve Outcome: Progressing   Problem: Fluid Volume: Goal: Ability to maintain a balanced intake and output will improve Outcome: Progressing   Problem: Health Behavior/Discharge Planning: Goal: Ability to identify and utilize available resources and services will improve Outcome: Progressing Goal: Ability to manage health-related needs will improve Outcome: Progressing   Problem: Metabolic: Goal: Ability to maintain appropriate glucose levels will improve Outcome: Progressing   Problem: Nutritional: Goal: Maintenance of adequate nutrition will improve Outcome: Progressing Goal: Progress toward achieving an optimal weight will improve Outcome: Progressing   Problem: Skin Integrity: Goal: Risk for impaired skin integrity will decrease Outcome: Progressing   Problem: Tissue Perfusion: Goal: Adequacy of tissue perfusion will improve Outcome: Progressing   Problem: Education: Goal: Knowledge of General Education information will improve Description: Including pain rating scale, medication(s)/side effects and non-pharmacologic comfort measures Outcome: Progressing   Problem: Health Behavior/Discharge Planning: Goal: Ability to manage health-related needs will improve Outcome: Progressing   Problem: Clinical Measurements: Goal: Ability to maintain clinical measurements within normal limits will improve Outcome: Progressing Goal: Will remain free from infection Outcome: Progressing Goal: Diagnostic test results will improve Outcome: Progressing Goal: Respiratory complications will improve Outcome: Progressing Goal: Cardiovascular complication will be avoided Outcome: Progressing   Problem: Activity: Goal:  Risk for activity intolerance will decrease Outcome: Progressing   Problem: Nutrition: Goal: Adequate nutrition will be maintained Outcome: Progressing   Problem: Coping: Goal: Level of anxiety will decrease Outcome: Progressing   Problem: Elimination: Goal: Will not experience complications related to bowel motility Outcome: Progressing Goal: Will not experience complications related to urinary retention Outcome: Progressing   Problem: Pain Managment: Goal: General experience of comfort will improve and/or be controlled Outcome: Progressing   Problem: Safety: Goal: Ability to remain free from injury will improve Outcome: Progressing   Problem: Skin Integrity: Goal: Risk for impaired skin integrity will decrease Outcome: Progressing   Problem: Education: Goal: Knowledge of disease or condition will improve Outcome: Progressing Goal: Knowledge of secondary prevention will improve (MUST DOCUMENT ALL) Outcome: Progressing Goal: Knowledge of patient specific risk factors will improve (DELETE if not current risk factor) Outcome: Progressing   Problem: Ischemic Stroke/TIA Tissue Perfusion: Goal: Complications of ischemic stroke/TIA will be minimized Outcome: Progressing   Problem: Coping: Goal: Will verbalize positive feelings about self Outcome: Progressing Goal: Will identify appropriate support needs Outcome: Progressing   Problem: Health Behavior/Discharge Planning: Goal: Ability to manage health-related needs will improve Outcome: Progressing Goal: Goals will be collaboratively established with patient/family Outcome: Progressing   Problem: Self-Care: Goal: Ability to participate in self-care as condition permits will improve Outcome: Progressing Goal: Verbalization of feelings and concerns over difficulty with self-care will improve Outcome: Progressing Goal: Ability to communicate needs accurately will improve Outcome: Progressing   Problem:  Nutrition: Goal: Risk of aspiration will decrease Outcome: Progressing Goal: Dietary intake will improve Outcome: Progressing   Problem: Education: Goal: Knowledge of disease or condition will improve Outcome: Progressing Goal: Knowledge of secondary prevention will improve (MUST DOCUMENT ALL) Outcome: Progressing Goal: Knowledge of patient specific risk factors will improve (DELETE if not current risk factor) Outcome: Progressing   Problem: Ischemic Stroke/TIA Tissue Perfusion: Goal: Complications of ischemic stroke/TIA will be minimized Outcome: Progressing   Problem: Coping: Goal: Will verbalize positive feelings about self Outcome: Progressing Goal: Will  identify appropriate support needs Outcome: Progressing   Problem: Health Behavior/Discharge Planning: Goal: Ability to manage health-related needs will improve Outcome: Progressing Goal: Goals will be collaboratively established with patient/family Outcome: Progressing   Problem: Self-Care: Goal: Ability to participate in self-care as condition permits will improve Outcome: Progressing Goal: Verbalization of feelings and concerns over difficulty with self-care will improve Outcome: Progressing Goal: Ability to communicate needs accurately will improve Outcome: Progressing

## 2024-01-31 DIAGNOSIS — N390 Urinary tract infection, site not specified: Secondary | ICD-10-CM

## 2024-01-31 DIAGNOSIS — Z7189 Other specified counseling: Secondary | ICD-10-CM | POA: Diagnosis not present

## 2024-01-31 DIAGNOSIS — F32A Depression, unspecified: Secondary | ICD-10-CM | POA: Diagnosis not present

## 2024-01-31 DIAGNOSIS — F0390 Unspecified dementia without behavioral disturbance: Secondary | ICD-10-CM | POA: Diagnosis not present

## 2024-01-31 DIAGNOSIS — Z9359 Other cystostomy status: Secondary | ICD-10-CM | POA: Diagnosis not present

## 2024-01-31 DIAGNOSIS — R627 Adult failure to thrive: Secondary | ICD-10-CM | POA: Diagnosis not present

## 2024-01-31 DIAGNOSIS — A419 Sepsis, unspecified organism: Secondary | ICD-10-CM

## 2024-01-31 DIAGNOSIS — Z515 Encounter for palliative care: Secondary | ICD-10-CM | POA: Diagnosis not present

## 2024-01-31 DIAGNOSIS — N179 Acute kidney failure, unspecified: Secondary | ICD-10-CM | POA: Diagnosis not present

## 2024-01-31 DIAGNOSIS — I10 Essential (primary) hypertension: Secondary | ICD-10-CM

## 2024-01-31 DIAGNOSIS — K922 Gastrointestinal hemorrhage, unspecified: Secondary | ICD-10-CM

## 2024-01-31 LAB — GLUCOSE, CAPILLARY
Glucose-Capillary: 88 mg/dL (ref 70–99)
Glucose-Capillary: 101 mg/dL — ABNORMAL HIGH (ref 70–99)
Glucose-Capillary: 176 mg/dL — ABNORMAL HIGH (ref 70–99)
Glucose-Capillary: 138 mg/dL — ABNORMAL HIGH (ref 70–99)

## 2024-01-31 NOTE — Assessment & Plan Note (Addendum)
 01/31/24 paxil  and remeron  are ordered but pt refusing to take meds.  02/01/24 stable. intermittently takes his meds    02/02/24 stable. Took most of his AM meds yesterday  02/03/24 stable  02/04/24 stable  02/05/24 stable  02/06/24 continue paxil  10 mg qam, remeron  15 mg at bedtime.

## 2024-01-31 NOTE — Assessment & Plan Note (Signed)
 01-11-2024 until 01-17-2024 due to UTI, Sepsis, CVA, hypernatremia. Resolved.

## 2024-01-31 NOTE — Plan of Care (Signed)
  Problem: Metabolic: Goal: Ability to maintain appropriate glucose levels will improve Outcome: Progressing   Problem: Tissue Perfusion: Goal: Adequacy of tissue perfusion will improve Outcome: Progressing   Problem: Clinical Measurements: Goal: Ability to maintain clinical measurements within normal limits will improve Outcome: Progressing Goal: Will remain free from infection Outcome: Progressing   Problem: Coping: Goal: Level of anxiety will decrease Outcome: Progressing   Problem: Elimination: Goal: Will not experience complications related to bowel motility Outcome: Progressing Goal: Will not experience complications related to urinary retention Outcome: Progressing   Problem: Pain Managment: Goal: General experience of comfort will improve and/or be controlled Outcome: Progressing   Problem: Safety: Goal: Ability to remain free from injury will improve Outcome: Progressing   Problem: Education: Goal: Ability to describe self-care measures that may prevent or decrease complications (Diabetes Survival Skills Education) will improve Outcome: Not Progressing   Problem: Coping: Goal: Ability to adjust to condition or change in health will improve Outcome: Not Progressing   Problem: Fluid Volume: Goal: Ability to maintain a balanced intake and output will improve Outcome: Not Progressing   Problem: Health Behavior/Discharge Planning: Goal: Ability to identify and utilize available resources and services will improve Outcome: Not Progressing Goal: Ability to manage health-related needs will improve Outcome: Not Progressing   Problem: Nutritional: Goal: Maintenance of adequate nutrition will improve Outcome: Not Progressing   Problem: Skin Integrity: Goal: Risk for impaired skin integrity will decrease Outcome: Not Progressing   Problem: Education: Goal: Knowledge of General Education information will improve Description: Including pain rating scale,  medication(s)/side effects and non-pharmacologic comfort measures Outcome: Not Progressing   Problem: Health Behavior/Discharge Planning: Goal: Ability to manage health-related needs will improve Outcome: Not Progressing   Problem: Activity: Goal: Risk for activity intolerance will decrease Outcome: Not Progressing   Problem: Nutrition: Goal: Adequate nutrition will be maintained Outcome: Not Progressing   Problem: Skin Integrity: Goal: Risk for impaired skin integrity will decrease Outcome: Not Progressing

## 2024-01-31 NOTE — Plan of Care (Signed)
 Problem: Metabolic: Goal: Ability to maintain appropriate glucose levels will improve Outcome: Progressing   Problem: Nutritional: Goal: Progress toward achieving an optimal weight will improve Outcome: Progressing   Problem: Skin Integrity: Goal: Risk for impaired skin integrity will decrease Outcome: Progressing   Problem: Clinical Measurements: Goal: Ability to maintain clinical measurements within normal limits will improve Outcome: Progressing Goal: Will remain free from infection Outcome: Progressing Goal: Diagnostic test results will improve Outcome: Progressing Goal: Respiratory complications will improve Outcome: Progressing Goal: Cardiovascular complication will be avoided Outcome: Progressing   Problem: Activity: Goal: Risk for activity intolerance will decrease Outcome: Progressing   Problem: Nutrition: Goal: Adequate nutrition will be maintained Outcome: Progressing   Problem: Elimination: Goal: Will not experience complications related to bowel motility Outcome: Progressing Goal: Will not experience complications related to urinary retention Outcome: Progressing   Problem: Pain Managment: Goal: General experience of comfort will improve and/or be controlled Outcome: Progressing   Problem: Safety: Goal: Ability to remain free from injury will improve Outcome: Progressing   Problem: Skin Integrity: Goal: Risk for impaired skin integrity will decrease Outcome: Progressing   Problem: Education: Goal: Knowledge of disease or condition will improve Outcome: Progressing Goal: Knowledge of secondary prevention will improve (MUST DOCUMENT ALL) Outcome: Progressing Goal: Knowledge of patient specific risk factors will improve (DELETE if not current risk factor) Outcome: Progressing   Problem: Ischemic Stroke/TIA Tissue Perfusion: Goal: Complications of ischemic stroke/TIA will be minimized Outcome: Progressing   Problem: Coping: Goal: Will  verbalize positive feelings about self Outcome: Progressing Goal: Will identify appropriate support needs Outcome: Progressing   Problem: Health Behavior/Discharge Planning: Goal: Ability to manage health-related needs will improve Outcome: Progressing Goal: Goals will be collaboratively established with patient/family Outcome: Progressing   Problem: Self-Care: Goal: Ability to participate in self-care as condition permits will improve Outcome: Progressing Goal: Verbalization of feelings and concerns over difficulty with self-care will improve Outcome: Progressing Goal: Ability to communicate needs accurately will improve Outcome: Progressing   Problem: Nutrition: Goal: Risk of aspiration will decrease Outcome: Progressing   Problem: Education: Goal: Knowledge of disease or condition will improve Outcome: Progressing Goal: Knowledge of secondary prevention will improve (MUST DOCUMENT ALL) Outcome: Progressing Goal: Knowledge of patient specific risk factors will improve (DELETE if not current risk factor) Outcome: Progressing   Problem: Coping: Goal: Will identify appropriate support needs Outcome: Progressing   Problem: Health Behavior/Discharge Planning: Goal: Goals will be collaboratively established with patient/family Outcome: Progressing   Problem: Education: Goal: Ability to describe self-care measures that may prevent or decrease complications (Diabetes Survival Skills Education) will improve Outcome: Not Progressing   Problem: Coping: Goal: Ability to adjust to condition or change in health will improve Outcome: Not Progressing   Problem: Fluid Volume: Goal: Ability to maintain a balanced intake and output will improve Outcome: Not Progressing   Problem: Health Behavior/Discharge Planning: Goal: Ability to identify and utilize available resources and services will improve Outcome: Not Progressing Goal: Ability to manage health-related needs will  improve Outcome: Not Progressing   Problem: Nutritional: Goal: Maintenance of adequate nutrition will improve Outcome: Not Progressing   Problem: Education: Goal: Knowledge of General Education information will improve Description: Including pain rating scale, medication(s)/side effects and non-pharmacologic comfort measures Outcome: Not Progressing   Problem: Health Behavior/Discharge Planning: Goal: Ability to manage health-related needs will improve Outcome: Not Progressing   Problem: Coping: Goal: Level of anxiety will decrease Outcome: Not Progressing   Problem: Nutrition: Goal: Dietary intake will improve Outcome: Not Progressing  Problem: Ischemic Stroke/TIA Tissue Perfusion: Goal: Complications of ischemic stroke/TIA will be minimized Outcome: Not Progressing   Problem: Coping: Goal: Will verbalize positive feelings about self Outcome: Not Progressing   Problem: Health Behavior/Discharge Planning: Goal: Ability to manage health-related needs will improve Outcome: Not Progressing   Problem: Self-Care: Goal: Ability to participate in self-care as condition permits will improve Outcome: Not Progressing Goal: Verbalization of feelings and concerns over difficulty with self-care will improve Outcome: Not Progressing Goal: Ability to communicate needs accurately will improve Outcome: Not Progressing

## 2024-01-31 NOTE — Assessment & Plan Note (Addendum)
 01/31/24 on SSI. Intermittently allow nursing staff to give him insulin  injections. CBG are mostly controlled.  02/01/24 intermittently takes his meds  CBG controlled for the most part  02/02/24 CBG stable  02/03/24 CBG stable.   02/04/24 stable  02/05/24 stable  02/06/24 continue SSI at SNF.

## 2024-01-31 NOTE — Assessment & Plan Note (Signed)
 chronic

## 2024-01-31 NOTE — Subjective & Objective (Addendum)
 Pt seen and examined. Pt would open his eyes to voice and then quickly close them after he saw who it was who called him. Refuses to speak. Non-verbal.

## 2024-01-31 NOTE — Assessment & Plan Note (Addendum)
 Present on admission. Wound 01/11/24 1900 Pressure Injury Buttocks Left Stage 3 -  Full thickness tissue loss. Subcutaneous fat may be visible but bone, tendon or muscle are NOT exposed. (Active)

## 2024-01-31 NOTE — Assessment & Plan Note (Signed)
 01-11-2024 through 01-17-2024. urine cx grew proteus, e. Coli and enterococcus.  Pt treated until 01-17-2024 with IV abx. Resolved.

## 2024-01-31 NOTE — Assessment & Plan Note (Addendum)
 01/31/24 norvasc  and coreg  ordered. Pt refusing to take some meds.  02/01/24 intermittently takes his meds. BP elevated due to pt inconsistently taking HTN meds  02/02/24 stable. Took most of his AM meds yesterday  02/03/24 stable  02/04/24 took about 1/2 of his meds yesterday. BP stable  02/05/24 BP elevated yesterday due to continued non-compliance of his HTN meds. Pt refusing to take about 1/2 of his meds. Will add clonidine  patch 0.2 mg so that he will ask least get something for HTN control all the time.  02/06/24 continue with clonidine  patch since pt is non-compliant most of the time with oral meds.

## 2024-01-31 NOTE — Assessment & Plan Note (Signed)
 01-11-2024 to 01-17-2024 pt given IVF. Acute metabolic acidosis resolved with IVF hydration.

## 2024-01-31 NOTE — Progress Notes (Addendum)
 PROGRESS NOTE    Ryan Hughes  FMW:[id number] DOB: 02/17/1966 DOA: 01/11/2024 PCP: [name], MD  Subjective: Pt seen and examined. On RA. No family at bedside. Will open his eyes to verbal challenge. No meaningful conversation.   Hospital Course: CC: confusion. Per EMS, Pt, from [facility], presents d/t increasing AMS and sweating x4 days.  Pt noted to be hypotensive.  Pt has a suprapubic catheter. Family reports Pt normally gets around well in a wheelchair and is talkative.   HPI: 58 yo AAM with PMH of dementia, depression, CVA, HLD, type II DM, PVD with right AKA, wheelchair-bound, BPH with suprapubic catheter present to the hospital with complaints of altered mental status.  History was obtained from family was at bedside.  Patient is unable to provide history secondary to confusion and unable to reach staff at the [employer] [facility].  Per EMS reportedly patient had confusion progressively worsening for last 4 days.  Per niece 2 days ago patient stopped taking his medications.  As the patient progressively decline [facility] staff sent the patient to hospital for further evaluation. Patient at his baseline is in wheelchair, normally carries out a conversation with clear speech but confused thoughts due to his dementia.  Patient pushes his wheelchair himself per niece. Patient has history of slipping out of his bed when family is visiting him.  And reportedly had a fall 2 weeks ago. No recent change in medications reported by the family. Per RN his suprapubic catheter was with significant sediments.  Significant Events: Admitted 01/11/2024 for acute hypernatremia, AKI 01-11-2024 until 9-10-20254 Acute hypernatremia, uremia  and ARF resolved with IVF. Na of 143, BUN of 16, Scr 1.08. pt UTI due to indwelling suprapubic tube treated with IV unasyn . Stopped on 01-17-2024 01-12-2024 to 01-14-2024  MRI brain shows acute lacunar strokes. Neurology consulted. Cva work up  proceeds. Echo normal. Carotid dopplers minimal stenosis. Final recs are:aspirin  81 mg daily and Brilinta  (ticagrelor ) 90 mg bid for 4 weeks and then aspirin  alone  01-17-2024 GI consulted due to melena and anemia. Pt transfused 1 unit PRBC. GI felt pt was as poor candidate for endoscopy procedure. 01-18-2024 palliative care consulted for GOC Week of 01-17-2024 through 01-23-2024 GI bleeding stops after Brilinta  held.  Pt's niece [name] wanted Brilinta  to be restarted(01-20-2024) Week of 01-24-2024 through 01-30-2024. Pt refusing to take meds as well as po food/liquids. He is combatative with nursing staff who are providing basic care including changing bed linens and cleaning around his suprapubic tube  Admission Labs: Na 162, K 4.5, CO2 of 19, BUN 214, Scr 4.0 T prot 6.5, alb 3.2, AST 19, ALT 22, alk phos 74, t. Bili 0.5 WBC 16.9, HgB 9.5, plt 301 Covid/rsv/flu negative VBG pH 7.29, PCO2 of 44 HIV non-reactive TSH 0.892, FT4 1.08 NH3 of 17  Admission Imaging Studies: CXR Hypoinflation. No acute findings.  CT head No acute intracranial abnormality. 2. Stable atrophy and chronic small vessel ischemic changes. Remote lacunar infarcts in the bilateral basal ganglia Renal U/S Increased bilateral renal cortical echogenicity, compatible with medical renal disease. 2. Trace right perinephric fluid.  No hydronephrosis   Significant Labs: 01-11-2024 Vit b12 of 1115, folate > 20, vit B1 of 136 01-13-2024 A1c of 6.1%  Significant Imaging Studies: 01-12-2024 MRI Brain There are multiple old lacunar infarcts including bilateral cerebellar, bilateral pontine, right midbrain, bilateral thalamic. There is an old infarct in the basal ganglia on the right with encephalomalacia. With the exception of the basal  ganglia infarct, all of these have developed since the prior MRI study from 2022. There are several acute lacunar infarcts involving the left frontal lobe and left posterior temporal/parietal lobe, and a  single acute lacunar infarct in the right frontal lobe 01-12-2024 MRA head No large vessel occlusion or proximal hemodynamically significant stenosis 01-15-2024 abd xr Mildly dilated loops of jejunum in the left mid abdomen. This could be due to a focal ileus or mild partial small bowel obstructio 01-15-2024 CT abd/pelvis No acute findings or explanation for the patient's symptoms. No evidence of bowel obstruction. 2. New small bilateral pleural effusions with adjacent dependent airspace opacities, likely atelectasis. 3. Distal esophageal wall thickening, similar to previous study, suggesting esophagitis or chronic reflux. Correlate clinically. 4. Suprapubic bladder catheter in place with possible mild bladder wall thickening. 5. Aortic Atherosclerosis (ICD10-I70.0). Interval postsurgical changes of the right groin consistent with femoral bypass procedure.  Antibiotic Therapy: Anti-infectives (From admission, onward)    Start     Dose/Rate Route Frequency Ordered Stop   01/13/24 1515  Ampicillin -Sulbactam (UNASYN ) 3 g in sodium chloride  0.9 % 100 mL IVPB        3 g 200 mL/hr over 30 Minutes Intravenous Every 6 hours 01/13/24 1430 01/17/24 2209   01/12/24 1200  piperacillin -tazobactam (ZOSYN ) IVPB 3.375 g  Status:  Discontinued        3.375 g 12.5 mL/hr over 240 Minutes Intravenous Every 8 hours 01/12/24 0737 01/13/24 1102   01/12/24 1000  linezolid  (ZYVOX ) IVPB 600 mg  Status:  Discontinued        600 mg 300 mL/hr over 60 Minutes Intravenous Every 12 hours 01/11/24 1659 01/12/24 1448   01/12/24 0600  piperacillin -tazobactam (ZOSYN ) IVPB 2.25 g  Status:  Discontinued        2.25 g 100 mL/hr over 30 Minutes Intravenous Every 8 hours 01/11/24 1746 01/12/24 0737   01/11/24 2200  linezolid  (ZYVOX ) IVPB 600 mg  Status:  Discontinued        600 mg 300 mL/hr over 60 Minutes Intravenous Every 12 hours 01/11/24 1659 01/11/24 1659   01/11/24 1415  ceFEPIme  (MAXIPIME ) 2 g in sodium chloride  0.9 % 100 mL  IVPB        2 g 200 mL/hr over 30 Minutes Intravenous  Once 01/11/24 1402 01/11/24 1532   01/11/24 1415  metroNIDAZOLE  (FLAGYL ) IVPB 500 mg        500 mg 100 mL/hr over 60 Minutes Intravenous  Once 01/11/24 1402 01/11/24 1647   01/11/24 1415  vancomycin  (VANCOCIN ) IVPB 1000 mg/200 mL premix  Status:  Discontinued        1,000 mg 200 mL/hr over 60 Minutes Intravenous  Once 01/11/24 1402 01/11/24 1406   01/11/24 1415  vancomycin  (VANCOREADY) IVPB 1500 mg/300 mL        1,500 mg 150 mL/hr over 120 Minutes Intravenous  Once 01/11/24 1407 01/11/24 1854       Procedures:   Consultants: Palliative care Neurology GI    Assessment and Plan: * Acute renal failure (ARF)-resolved as of 01/31/2024 01-11-2024 to 01-17-2024. admitted for ARF/uremia with a BUN of 214, Scr 4.0. pt given IVF over 5 days. By 9-10-20254 Acute hypernatremia, uremia  and ARF resolved. By 9-10-205 Na of 143, BUN of 16, Scr 1.08.   Goals of care, counseling/discussion 01-31-2024 today, palliative care successful in getting pt's niece to agree to changing pt's code status to DNR/DNI. CM/TOC attempting SNF placement.  UTI (urinary tract infection) due to urinary indwelling  catheter (suprapubic tube)-resolved as of 01/31/2024 01-11-2024 through 01-17-2024. urine cx grew proteus, e. Coli and enterococcus.  Pt treated until 01-17-2024 with IV abx. Resolved.  Sepsis with acute renal failure without septic shock (HCC)-resolved as of 01/31/2024 01-11-2024 present on admission. WBC 16.9, Scr 4.0. UA showed signs of infection  01-11-2024 through 01-17-2024. urine cx grew proteus, e. Coli and enterococcus.  Pt treated until 01-17-2024 with IV abx.  Lacunar infarct, acute (HCC)-resolved as of 01/31/2024 01-12-2024 MRI brain, MRA head that was ordered on admission showed acute lacunar infarcts. On top of his baseline old strokes of bilateral cerebellar, bilateral pontine, right midbrain, bilateral thalamic.  He has old infarct in the right  basal ganglia with encephalomalacia. Neurology was consulted.  01-14-2024. He underwent further workup by neurology including carotid dopplers and echo. Final recs from neurology were: aspirin  81 mg daily and Brilinta  (ticagrelor ) 90 mg bid for 4 weeks(end date February 11, 2024) and then aspirin  alone. Neurology signs off  Normal anion gap metabolic acidosis-resolved as of 01/31/2024 01-11-2024 to 01-17-2024 pt given IVF. Acute metabolic acidosis resolved with IVF hydration.  Uremia due to inadequate renal perfusion-resolved as of 01/31/2024 01-11-2024 to 01-17-2024. admitted for ARF/uremia with a BUN of 214, Scr 4.0. pt given IVF over 5 days. By 9-10-20254 Acute hypernatremia, uremia  and ARF resolved. By 9-10-205 Na of 143, BUN of 16, Scr 1.08.   Acute hypernatremia-resolved as of 01/31/2024 01-11-2024 to 01-17-2024. admitted for ARF/uremia with a BUN of 214, Scr 4.0. pt given IVF over 5 days. By 9-10-20254 Acute hypernatremia, uremia  and ARF resolved. By 9-10-205 Na of 143, BUN of 16, Scr 1.08.   Pressure injury of skin Present on admission. Wound 01/11/24 1900 Pressure Injury Buttocks Left Stage 3 -  Full thickness tissue loss. Subcutaneous fat may be visible but bone, tendon or muscle are NOT exposed. (Active)    Depression 01/31/24 paxil  and remeron  are ordered but pt refusing to take meds.    Dementia without behavioral disturbance (HCC) 01/31/24 paxil  and remeron  are ordered but pt refusing to take meds.   Chronic suprapubic catheter (HCC) Chronic. Unknown when it was initially placed, who placed it.  Type 2 diabetes mellitus with vascular disease (HCC) 01/31/24 on SSI. Intermittently allow nursing staff to give him insulin  injections. CBG are mostly controlled.   Malnutrition of moderate degree Nutrition Status: Nutrition Problem: Moderate Malnutrition Etiology: chronic illness (dementia) Signs/Symptoms: mild fat depletion, mild muscle depletion Interventions: Nepro  shake    Hx of AKA (above knee amputation), right chronic  PAD (peripheral artery disease) 01/31/24 stable. ASA ordered.    Essential hypertension 01/31/24 norvasc  and coreg  ordered. Pt refusing to take some meds.   UGI bleed-resolved as of 01/31/2024 01-17-2024 through 01-20-2024  GI consulted due to melena and anemia. Pt transfused 1 unit PRBC. GI felt pt was as poor candidate for endoscopy procedure. Brilinta  held. By 01-20-2024, GI bleeding had stopped. Pt's niece wanted Brilinta  to be restarted(01-20-2024)  Unwitnessed fall-resolved as of 01/31/2024 01-11-2024 reportedly fell at [facility]. Unable to confirm at time of admission that this really happened.  Acute metabolic encephalopathy-resolved as of 01/31/2024 01-11-2024 until 01-17-2024 due to UTI, Sepsis, CVA, hypernatremia. Resolved.  Sepsis secondary to UTI due to chronic indwelling catheter-resolved as of 01/31/2024 01-11-2024 present on admission. WBC 16.9, Scr 4.0. UA showed signs of infection   01-11-2024 through 01-17-2024. urine cx grew proteus, e. Coli and enterococcus.  Pt treated until 01-17-2024 with IV abx.  Elevated troponin-resolved as of  01/31/2024 01-12-2024 was felt not to be due to NSTEMI or demand ischemia but rather due to profound renal failure, dehydration. Resolved.   DVT prophylaxis:  No chemical DVT prophylaxis due to GI bleed on 01-17-2024    Code Status: Limited: Do not attempt resuscitation (DNR) -DNR-LIMITED -Do Not Intubate/DNI  Family Communication: no family at bedside Disposition Plan: SNF Reason for continuing need for hospitalization: stable for transfer to SNF.  Objective: Vitals:   01/31/24 0516 01/31/24 0516 01/31/24 1221 01/31/24 1717  BP: (!) 145/68 (!) 145/68 125/80 (!) 150/81  Pulse: 68 68 80 81  Resp: 17 17 18 18   Temp: (!) 97.5 F (36.4 C) (!) 97.5 F (36.4 C) 98.5 F (36.9 C) 98.9 F (37.2 C)  TempSrc: Oral  Axillary   SpO2: 99% 99% 100% 100%  Weight:      Height:       No  intake or output data in the 24 hours ending 01/31/24 1754 Filed Weights   01/11/24 1411  Weight: 89.8 kg    Examination:  Physical Exam Vitals and nursing note reviewed.  Constitutional:      Comments: Awake but chronically ill appearing  HENT:     Head: Normocephalic.  Cardiovascular:     Rate and Rhythm: Normal rate.  Pulmonary:     Effort: Pulmonary effort is normal.  Abdominal:     General: Abdomen is flat.  Musculoskeletal:     Comments: Well healed right AKA Left lower leg in prevelon boot  Skin:    General: Skin is warm.     Capillary Refill: Capillary refill takes less than 2 seconds.  Neurological:     Mental Status: He is disoriented.     Data Reviewed: I have personally reviewed following labs and imaging studies  CBC: Recent Labs  Lab 01/25/24 0347 01/26/24 0355 01/29/24 0450  WBC 7.4 8.3 6.3  HGB 7.9* 8.9* 9.2*  HCT 23.9* 27.2* 28.0*  MCV 93.7 94.4 93.3  PLT 298 332 364   Basic Metabolic Panel: Recent Labs  Lab 01/26/24 0355 01/29/24 0450  NA 142 141  K 3.9 3.7  CL 105 106  CO2 20* 25  GLUCOSE 106* 108*  BUN 8 10  CREATININE 1.04 1.27*  CALCIUM  8.6* 8.8*   GFR: Estimated Creatinine Clearance: 65.7 mL/min (A) (by C-G formula based on SCr of 1.27 mg/dL (H)). CBG: Recent Labs  Lab 01/30/24 1527 01/30/24 2147 01/31/24 0747 01/31/24 1222 01/31/24 1715  GLUCAP 197* 101* 88 101* 176*   Scheduled Meds:  amLODipine   5 mg Oral Daily   aspirin  EC  81 mg Oral Daily   atorvastatin   80 mg Oral QHS   carvedilol   12.5 mg Oral BID WC   dutasteride   0.5 mg Oral Daily   feeding supplement (NEPRO CARB STEADY)  237 mL Oral TID BM   insulin  aspart  0-15 Units Subcutaneous TID WC   insulin  aspart  0-5 Units Subcutaneous QHS   leptospermum manuka honey  1 Application Topical Daily   mirtazapine   15 mg Oral QHS   mouth rinse  15 mL Mouth Rinse 4 times per day   pantoprazole   40 mg Oral BID   PARoxetine   10 mg Oral Daily   sucralfate   1 g Oral  TID WC & HS   tamsulosin   0.8 mg Oral Daily   ticagrelor   90 mg Oral BID   Continuous Infusions:   LOS: 20 days   Time spent: 80 minutes  Camellia Door, DO  [facility]  01/31/2024, 5:54 PM

## 2024-01-31 NOTE — Assessment & Plan Note (Addendum)
 01-12-2024 MRI brain, MRA head that was ordered on admission showed acute lacunar infarcts. On top of his baseline old strokes of bilateral cerebellar, bilateral pontine, right midbrain, bilateral thalamic.  He has old infarct in the right basal ganglia with encephalomalacia. Neurology was consulted.  01-14-2024. He underwent further workup by neurology including carotid dopplers and echo. Final recs from neurology were: aspirin  81 mg daily and Brilinta  (ticagrelor ) 90 mg bid for 4 weeks(end date February 11, 2024) and then aspirin  alone. Neurology signs off

## 2024-01-31 NOTE — Assessment & Plan Note (Signed)
 01-11-2024 to 01-17-2024. admitted for ARF/uremia with a BUN of 214, Scr 4.0. pt given IVF over 5 days. By 9-10-20254 Acute hypernatremia, uremia  and ARF resolved. By 9-10-205 Na of 143, BUN of 16, Scr 1.08.

## 2024-01-31 NOTE — Progress Notes (Signed)
 Palliative:  HPI: 58 y.o. male with past medical history of dementia, CVA w/ L hemiplegia and aphasia, HLD, HTN, DM2, PVD with right AKA, BPH with suprapubic catheter admitted from [facility] on 01/11/2024 with increasing AMS and sweating. Pt found with acute renal failure, hypernatremia, metabolic acidosis with normal anion gap, sepsis secondary to UTI with suprapubic catheter, acute metabolic encephalopathy. MRI (+) Acute lacunar infaracts involving L frontal, L posterior temporal/ parietal lobe and single acute lacunar infarct R frontal lobe.   I met today at [facility] bedside but he is sound asleep so I did not awaken. I discussed with RN and NT who report ongoing poor intake although accepting of liquids and mostly refusing medications.   I called and spoke with niece/HCPOA [name] as planned. We reviewed our conversations and his progress. We further discussed expectations. [name] is prepared at this time to put in place DNR. She does feel that he would not desire PEG tube as he has indicated this to her multiple times. [name] understands that his time is limited. We discussed further goals of care and she would like to continue medications knowing that this may not be a good option to continue if he continues to refuse. We discussed best step forward. We discussed hospice. He is likely not yet eligible for hospice facility. He is a good candidate to have hospice to follow at LTC. I explained the goal of hospice to help ensure he is as happy and comfortable as possible. We discussed that hospice will be prepared to ensure he is comfortable as he nears the end of his life instead of having to go back and forth to the hospital when we cannot fix his condition. I reassured [name] that I believe she is making the right decision as this seems consistent with his Living Will as well as what he has shared in moments he was able to share. [name] shares appreciation for the support. I encouraged her  to reach out with any further concerns.   All questions/concerns addressed. Emotional support provided. Updated Dr. Laurence and TOC.   Exam: Sleeping - appears peaceful. No distress. Breathing regular, unlabored.   Plan: - DNR/DNI - No PEG - Long term care placement with hospice to follow  50 min  Bernarda Kitty, NP Palliative Medicine Team Pager [phone] (Please see [email or web address] for schedule) Team Phone [phone]

## 2024-01-31 NOTE — Assessment & Plan Note (Signed)
 01-17-2024 through 01-20-2024  GI consulted due to melena and anemia. Pt transfused 1 unit PRBC. GI felt pt was as poor candidate for endoscopy procedure. Brilinta  held. By 01-20-2024, GI bleeding had stopped. Pt's niece wanted Brilinta  to be restarted(01-20-2024)

## 2024-01-31 NOTE — Assessment & Plan Note (Addendum)
 01-31-2024 today, palliative care successful in getting pt's niece to agree to changing pt's code status to DNR/DNI. CM/TOC attempting SNF placement.  02/02/24 still awaiting SNF placement.  02/03/24 awaiting SNF placement  02/04/24 awaiting SNF placement.  02/05/24 awaiting SNF placement  02/06/24 SW has secured LTC with [facility]. Stable for DC today.

## 2024-01-31 NOTE — Assessment & Plan Note (Addendum)
 Chronic. Unknown when it was initially placed, who placed it.  02/01/24 stable.  02/02/24 stable  02/03/24 stable. Yellow urine in catheter bag  02/04/24 stable. Yellow urine in bag  02/05/24 stable  02/06/24 continue with chronic suprapubic tube. Change every 2 months. He can go to urology office to have it changed out.

## 2024-01-31 NOTE — Assessment & Plan Note (Addendum)
 01-11-2024 present on admission. WBC 16.9, Scr 4.0. UA showed signs of infection   01-11-2024 through 01-17-2024. urine cx grew proteus, e. Coli and enterococcus.  Pt treated until 01-17-2024 with IV abx.

## 2024-01-31 NOTE — Assessment & Plan Note (Addendum)
 01/31/24 stable. ASA ordered.   02/01/24 stable.  02/02/24 stable.  02/03/24 stable  02/04/24 stable  02/05/24 stable  02/06/24 continue lipitor  80 mg daily, asa 81 mg daily, brilinta  90 mg bid.

## 2024-01-31 NOTE — Progress Notes (Signed)
 Nutrition Follow-up  DOCUMENTATION CODES:   Non-severe (moderate) malnutrition in context of chronic illness (dementia)  INTERVENTION:   Encourage adequate intake of current diet to meet calorie and protein needs  Continue to discuss GOC and if enteral nutrition support (PEG) aligns with GOC; would recommend initiating enteral nutrition to supplement calorie and protein while PO intake remains poor.   Nepro Shake po TID, each supplement provides 425 kcal and 19 grams protein  Mighty Shake TID with meals, each supplement provides 330 kcals and 9 grams of protein     NUTRITION DIAGNOSIS:   Moderate Malnutrition related to chronic illness (dementia) as evidenced by mild fat depletion, mild muscle depletion.  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 Admitted with AMS, diagnosed UTI, AKI, hypernatremia, and code stroke.  9/4 admitted; MRI shows acute lacunar stroke 9/5 SLP bedside eval recommends DYS 1 w/ nectar thick 9/6 advanced to DYS 2 w/ nectar thick 9/17 Calorie count completed. Pending GOC for PEG  Patient with waxing/waning mentation. Patient will track with eyes when awake and sometimes will verbalize or nod his head when spoken too, noted to have have brief episodes of agitation. Recently has been refusing medications, food and other aspects of care. Palliative care following and planning to discuss GOC with niece/POA today, likely to lean towards a more comfort based approach.   Average Meal Completion  9/18-9/23: 11% average intake x 5 recorded meals  Medications reviewed and include:  SSI Novolog  TID with meals SSI Novolog  daily  Protonix  40 mg BID Simethicone  QID Remeron  daily    Labs reviewed: CBG 88-197  NUTRITION - FOCUSED PHYSICAL EXAM:  Wheelchair bound [employer] Most Recent Value  Orbital Region Moderate depletion  Upper Arm Region Mild depletion  Thoracic and Lumbar Region Mild depletion  Buccal Region Mild depletion  Temple Region Moderate depletion  Clavicle Bone Region Mild depletion  Clavicle and Acromion Bone Region Mild depletion  Scapular Bone Region Mild depletion  Dorsal Hand Mild depletion  Patellar Region Mild depletion  [R AKA]  Anterior Thigh Region Mild depletion  [R AKA]  Posterior Calf Region Mild depletion  [R AKA]  Edema (RD Assessment) None  Hair Reviewed  Eyes Unable to assess  [sleeping]  Mouth Unable to assess  [sleeping]  Skin Reviewed  Nails Reviewed   Weight Information (since admission)     Date/Time Weight Weight in lbs BSA (Calculated - sq m) BMI (Calculated) Who   01/11/24 1411 89.8 kg 198 lbs 2.04 sq meters 31.97 AC        Diet Order:   Diet Order             Diet regular Room service appropriate? Yes with Assist; Fluid consistency: Thin  Diet effective now                   EDUCATION NEEDS:   Not appropriate for education at this time  Skin:  Skin Assessment: Skin Integrity Issues: Skin Integrity Issues:: Stage II, Incisions Stage II: L buttocks Incisions: R thigh  Last BM:  9/23 type 6  Height:   Ht Readings from Last 1 Encounters:  01/11/24 5' 6 (1.676 m)    Weight:  Wt Readings from Last 1 Encounters:  01/11/24 89.8 kg    Ideal Body Weight:  59.3 kg adjusted for R AKA  BMI:  Body mass index is 31.96 kg/m.  Estimated Nutritional Needs:   Kcal:  1500-1700  Protein:  70-85g  Fluid:  1.5-1.7L   Swayze Pries, MS, RD, LDN Clinical Dietitian  Please see [email or web address] for contact information.

## 2024-01-31 NOTE — Assessment & Plan Note (Signed)
 Nutrition Status: Nutrition Problem: Moderate Malnutrition Etiology: chronic illness (dementia) Signs/Symptoms: mild fat depletion, mild muscle depletion Interventions: Nepro shake

## 2024-01-31 NOTE — Assessment & Plan Note (Signed)
 01-11-2024 reportedly fell at SNF. Unable to confirm at time of admission that this really happened.

## 2024-01-31 NOTE — Assessment & Plan Note (Signed)
 01-12-2024 was felt not to be due to NSTEMI or demand ischemia but rather due to profound renal failure, dehydration. Resolved.

## 2024-01-31 NOTE — Assessment & Plan Note (Signed)
 01-11-2024 present on admission. WBC 16.9, Scr 4.0. UA showed signs of infection   01-11-2024 through 01-17-2024. urine cx grew proteus, e. Coli and enterococcus.  Pt treated until 01-17-2024 with IV abx.

## 2024-01-31 NOTE — Assessment & Plan Note (Addendum)
 01/31/24 paxil  and remeron  are ordered but pt refusing to take meds.  02/01/24 stable. intermittently takes his meds   02/02/24 stable. Took most of his AM meds yesterday  02/03/24 stable.  02/04/24 stable  02/05/24 stable. Still refusing meds  02/06/24 continue paxil  10 mg qam, remeron  15 mg at bedtime.

## 2024-01-31 NOTE — Plan of Care (Signed)
  Problem: Metabolic: Goal: Ability to maintain appropriate glucose levels will improve Outcome: Progressing   Problem: Skin Integrity: Goal: Risk for impaired skin integrity will decrease Outcome: Progressing   Problem: Tissue Perfusion: Goal: Adequacy of tissue perfusion will improve Outcome: Progressing   Problem: Clinical Measurements: Goal: Ability to maintain clinical measurements within normal limits will improve Outcome: Progressing Goal: Will remain free from infection Outcome: Progressing   Problem: Elimination: Goal: Will not experience complications related to bowel motility Outcome: Progressing Goal: Will not experience complications related to urinary retention Outcome: Progressing   Problem: Pain Managment: Goal: General experience of comfort will improve and/or be controlled Outcome: Progressing   Problem: Safety: Goal: Ability to remain free from injury will improve Outcome: Progressing   Problem: Education: Goal: Ability to describe self-care measures that may prevent or decrease complications (Diabetes Survival Skills Education) will improve Outcome: Not Progressing   Problem: Fluid Volume: Goal: Ability to maintain a balanced intake and output will improve Outcome: Not Progressing   Problem: Health Behavior/Discharge Planning: Goal: Ability to identify and utilize available resources and services will improve Outcome: Not Progressing Goal: Ability to manage health-related needs will improve Outcome: Not Progressing   Problem: Nutritional: Goal: Maintenance of adequate nutrition will improve Outcome: Not Progressing   Problem: Education: Goal: Knowledge of General Education information will improve Description: Including pain rating scale, medication(s)/side effects and non-pharmacologic comfort measures Outcome: Not Progressing   Problem: Health Behavior/Discharge Planning: Goal: Ability to manage health-related needs will improve Outcome:  Not Progressing   Problem: Activity: Goal: Risk for activity intolerance will decrease Outcome: Not Progressing   Problem: Nutrition: Goal: Adequate nutrition will be maintained Outcome: Not Progressing   Problem: Coping: Goal: Level of anxiety will decrease Outcome: Not Progressing

## 2024-02-01 DIAGNOSIS — E440 Moderate protein-calorie malnutrition: Secondary | ICD-10-CM

## 2024-02-01 DIAGNOSIS — Z9359 Other cystostomy status: Secondary | ICD-10-CM | POA: Diagnosis not present

## 2024-02-01 DIAGNOSIS — F0390 Unspecified dementia without behavioral disturbance: Secondary | ICD-10-CM | POA: Diagnosis not present

## 2024-02-01 DIAGNOSIS — E1159 Type 2 diabetes mellitus with other circulatory complications: Secondary | ICD-10-CM | POA: Diagnosis not present

## 2024-02-01 DIAGNOSIS — I10 Essential (primary) hypertension: Secondary | ICD-10-CM | POA: Diagnosis not present

## 2024-02-01 LAB — GLUCOSE, CAPILLARY
Glucose-Capillary: 102 mg/dL — ABNORMAL HIGH (ref 70–99)
Glucose-Capillary: 108 mg/dL — ABNORMAL HIGH (ref 70–99)
Glucose-Capillary: 119 mg/dL — ABNORMAL HIGH (ref 70–99)

## 2024-02-01 NOTE — Plan of Care (Signed)
 Problem: Education: Goal: Ability to describe self-care measures that may prevent or decrease complications (Diabetes Survival Skills Education) will improve Outcome: Progressing   Problem: Coping: Goal: Ability to adjust to condition or change in health will improve Outcome: Progressing   Problem: Fluid Volume: Goal: Ability to maintain a balanced intake and output will improve Outcome: Progressing   Problem: Health Behavior/Discharge Planning: Goal: Ability to identify and utilize available resources and services will improve Outcome: Progressing Goal: Ability to manage health-related needs will improve Outcome: Progressing   Problem: Metabolic: Goal: Ability to maintain appropriate glucose levels will improve Outcome: Progressing   Problem: Nutritional: Goal: Maintenance of adequate nutrition will improve Outcome: Progressing Goal: Progress toward achieving an optimal weight will improve Outcome: Progressing   Problem: Skin Integrity: Goal: Risk for impaired skin integrity will decrease Outcome: Progressing   Problem: Tissue Perfusion: Goal: Adequacy of tissue perfusion will improve Outcome: Progressing   Problem: Education: Goal: Knowledge of General Education information will improve Description: Including pain rating scale, medication(s)/side effects and non-pharmacologic comfort measures Outcome: Progressing   Problem: Health Behavior/Discharge Planning: Goal: Ability to manage health-related needs will improve Outcome: Progressing   Problem: Clinical Measurements: Goal: Ability to maintain clinical measurements within normal limits will improve Outcome: Progressing Goal: Will remain free from infection Outcome: Progressing Goal: Diagnostic test results will improve Outcome: Progressing Goal: Respiratory complications will improve Outcome: Progressing Goal: Cardiovascular complication will be avoided Outcome: Progressing   Problem: Activity: Goal:  Risk for activity intolerance will decrease Outcome: Progressing   Problem: Nutrition: Goal: Adequate nutrition will be maintained Outcome: Progressing   Problem: Coping: Goal: Level of anxiety will decrease Outcome: Progressing   Problem: Elimination: Goal: Will not experience complications related to bowel motility Outcome: Progressing Goal: Will not experience complications related to urinary retention Outcome: Progressing   Problem: Pain Managment: Goal: General experience of comfort will improve and/or be controlled Outcome: Progressing   Problem: Safety: Goal: Ability to remain free from injury will improve Outcome: Progressing   Problem: Skin Integrity: Goal: Risk for impaired skin integrity will decrease Outcome: Progressing   Problem: Education: Goal: Knowledge of disease or condition will improve Outcome: Progressing Goal: Knowledge of secondary prevention will improve (MUST DOCUMENT ALL) Outcome: Progressing Goal: Knowledge of patient specific risk factors will improve (DELETE if not current risk factor) Outcome: Progressing   Problem: Ischemic Stroke/TIA Tissue Perfusion: Goal: Complications of ischemic stroke/TIA will be minimized Outcome: Progressing   Problem: Coping: Goal: Will verbalize positive feelings about self Outcome: Progressing Goal: Will identify appropriate support needs Outcome: Progressing   Problem: Health Behavior/Discharge Planning: Goal: Ability to manage health-related needs will improve Outcome: Progressing Goal: Goals will be collaboratively established with patient/family Outcome: Progressing   Problem: Self-Care: Goal: Ability to participate in self-care as condition permits will improve Outcome: Progressing Goal: Verbalization of feelings and concerns over difficulty with self-care will improve Outcome: Progressing Goal: Ability to communicate needs accurately will improve Outcome: Progressing   Problem:  Nutrition: Goal: Risk of aspiration will decrease Outcome: Progressing Goal: Dietary intake will improve Outcome: Progressing   Problem: Education: Goal: Knowledge of disease or condition will improve Outcome: Progressing Goal: Knowledge of secondary prevention will improve (MUST DOCUMENT ALL) Outcome: Progressing Goal: Knowledge of patient specific risk factors will improve (DELETE if not current risk factor) Outcome: Progressing   Problem: Ischemic Stroke/TIA Tissue Perfusion: Goal: Complications of ischemic stroke/TIA will be minimized Outcome: Progressing   Problem: Coping: Goal: Will verbalize positive feelings about self Outcome: Progressing Goal: Will  identify appropriate support needs Outcome: Progressing   Problem: Health Behavior/Discharge Planning: Goal: Ability to manage health-related needs will improve Outcome: Progressing Goal: Goals will be collaboratively established with patient/family Outcome: Progressing   Problem: Self-Care: Goal: Ability to participate in self-care as condition permits will improve Outcome: Progressing Goal: Verbalization of feelings and concerns over difficulty with self-care will improve Outcome: Progressing Goal: Ability to communicate needs accurately will improve Outcome: Progressing

## 2024-02-01 NOTE — Progress Notes (Signed)
 PROGRESS NOTE    Ryan Hughes  FMW:[id number] DOB: [birth year] DOA: 01/11/2024 PCP: [name], MD  Subjective: Pt seen and examined. Sleeping today. Briefly opens his eyes to verbal command. Nonverbal today.   Hospital Course: CC: confusion. Per EMS, Pt, from [facility], presents d/t increasing AMS and sweating x4 days.  Pt noted to be hypotensive.  Pt has a suprapubic catheter. Family reports Pt normally gets around well in a wheelchair and is talkative.   HPI: 58 yo AAM with PMH of dementia, depression, CVA, HLD, type II DM, PVD with right AKA, wheelchair-bound, BPH with suprapubic catheter present to the hospital with complaints of altered mental status.  History was obtained from family was at bedside.  Patient is unable to provide history secondary to confusion and unable to reach staff at the [facility].  Per EMS reportedly patient had confusion progressively worsening for last 4 days.  Per niece 2 days ago patient stopped taking his medications.  As the patient progressively decline [facility] staff sent the patient to hospital for further evaluation. Patient at his baseline is in wheelchair, normally carries out a conversation with clear speech but confused thoughts due to his dementia.  Patient pushes his wheelchair himself per niece. Patient has history of slipping out of his bed when family is visiting him.  And reportedly had a fall 2 weeks ago. No recent change in medications reported by the family. Per RN his suprapubic catheter was with significant sediments.  Significant Events: Admitted 01/11/2024 for acute hypernatremia, AKI 01-11-2024 until 9-10-20254 Acute hypernatremia, uremia  and ARF resolved with IVF. Na of 143, BUN of 16, Scr 1.08. pt UTI due to indwelling suprapubic tube treated with IV unasyn . Stopped on 01-17-2024 01-12-2024 to 01-14-2024  MRI brain shows acute lacunar strokes. Neurology consulted. Cva work up proceeds. Echo normal.  Carotid dopplers minimal stenosis. Final recs are:aspirin  81 mg daily and Brilinta  (ticagrelor ) 90 mg bid for 4 weeks and then aspirin  alone  01-17-2024 GI consulted due to melena and anemia. Pt transfused 1 unit PRBC. GI felt pt was as poor candidate for endoscopy procedure. 01-18-2024 palliative care consulted for GOC Week of 01-17-2024 through 01-23-2024 GI bleeding stops after Brilinta  held.  Pt's niece [name] wanted Brilinta  to be restarted(01-20-2024) Week of 01-24-2024 through 01-30-2024. Pt refusing to take meds as well as po food/liquids. He is combatative with nursing staff who are providing basic care including changing bed linens and cleaning around his suprapubic tube  Admission Labs: Na 162, K 4.5, CO2 of 19, BUN 214, Scr 4.0 T prot 6.5, alb 3.2, AST 19, ALT 22, alk phos 74, t. Bili 0.5 WBC 16.9, HgB 9.5, plt 301 Covid/rsv/flu negative VBG pH 7.29, PCO2 of 44 HIV non-reactive TSH 0.892, FT4 1.08 NH3 of 17  Admission Imaging Studies: CXR Hypoinflation. No acute findings.  CT head No acute intracranial abnormality. 2. Stable atrophy and chronic small vessel ischemic changes. Remote lacunar infarcts in the bilateral basal ganglia Renal U/S Increased bilateral renal cortical echogenicity, compatible with medical renal disease. 2. Trace right perinephric fluid.  No hydronephrosis   Significant Labs: 01-11-2024 Vit b12 of 1115, folate > 20, vit B1 of 136 01-13-2024 A1c of 6.1%  Significant Imaging Studies: 01-12-2024 MRI Brain There are multiple old lacunar infarcts including bilateral cerebellar, bilateral pontine, right midbrain, bilateral thalamic. There is an old infarct in the basal ganglia on the right with encephalomalacia. With the exception of the basal ganglia infarct, all of these  have developed since the prior MRI study from 2022. There are several acute lacunar infarcts involving the left frontal lobe and left posterior temporal/parietal lobe, and a single acute lacunar  infarct in the right frontal lobe 01-12-2024 MRA head No large vessel occlusion or proximal hemodynamically significant stenosis 01-15-2024 abd xr Mildly dilated loops of jejunum in the left mid abdomen. This could be due to a focal ileus or mild partial small bowel obstructio 01-15-2024 CT abd/pelvis No acute findings or explanation for the patient's symptoms. No evidence of bowel obstruction. 2. New small bilateral pleural effusions with adjacent dependent airspace opacities, likely atelectasis. 3. Distal esophageal wall thickening, similar to previous study, suggesting esophagitis or chronic reflux. Correlate clinically. 4. Suprapubic bladder catheter in place with possible mild bladder wall thickening. 5. Aortic Atherosclerosis (ICD10-I70.0). Interval postsurgical changes of the right groin consistent with femoral bypass procedure.  Antibiotic Therapy: Anti-infectives (From admission, onward)    Start     Dose/Rate Route Frequency Ordered Stop   01/13/24 1515  Ampicillin -Sulbactam (UNASYN ) 3 g in sodium chloride  0.9 % 100 mL IVPB        3 g 200 mL/hr over 30 Minutes Intravenous Every 6 hours 01/13/24 1430 01/17/24 2209   01/12/24 1200  piperacillin -tazobactam (ZOSYN ) IVPB 3.375 g  Status:  Discontinued        3.375 g 12.5 mL/hr over 240 Minutes Intravenous Every 8 hours 01/12/24 0737 01/13/24 1102   01/12/24 1000  linezolid  (ZYVOX ) IVPB 600 mg  Status:  Discontinued        600 mg 300 mL/hr over 60 Minutes Intravenous Every 12 hours 01/11/24 1659 01/12/24 1448   01/12/24 0600  piperacillin -tazobactam (ZOSYN ) IVPB 2.25 g  Status:  Discontinued        2.25 g 100 mL/hr over 30 Minutes Intravenous Every 8 hours 01/11/24 1746 01/12/24 0737   01/11/24 2200  linezolid  (ZYVOX ) IVPB 600 mg  Status:  Discontinued        600 mg 300 mL/hr over 60 Minutes Intravenous Every 12 hours 01/11/24 1659 01/11/24 1659   01/11/24 1415  ceFEPIme  (MAXIPIME ) 2 g in sodium chloride  0.9 % 100 mL IVPB        2 g 200  mL/hr over 30 Minutes Intravenous  Once 01/11/24 1402 01/11/24 1532   01/11/24 1415  metroNIDAZOLE  (FLAGYL ) IVPB 500 mg        500 mg 100 mL/hr over 60 Minutes Intravenous  Once 01/11/24 1402 01/11/24 1647   01/11/24 1415  vancomycin  (VANCOCIN ) IVPB 1000 mg/200 mL premix  Status:  Discontinued        1,000 mg 200 mL/hr over 60 Minutes Intravenous  Once 01/11/24 1402 01/11/24 1406   01/11/24 1415  vancomycin  (VANCOREADY) IVPB 1500 mg/300 mL        1,500 mg 150 mL/hr over 120 Minutes Intravenous  Once 01/11/24 1407 01/11/24 1854       Procedures:   Consultants: Palliative care Neurology GI    Assessment and Plan: * Acute renal failure (ARF)-resolved as of 01/31/2024 01-11-2024 to 01-17-2024. admitted for ARF/uremia with a BUN of 214, Scr 4.0. pt given IVF over 5 days. By 9-10-20254 Acute hypernatremia, uremia  and ARF resolved. By 9-10-205 Na of 143, BUN of 16, Scr 1.08.   Goals of care, counseling/discussion 01-31-2024 today, palliative care successful in getting pt's niece to agree to changing pt's code status to DNR/DNI. CM/TOC attempting SNF placement.  UTI (urinary tract infection) due to urinary indwelling catheter (suprapubic tube)-resolved as of  01/31/2024 01-11-2024 through 01-17-2024. urine cx grew proteus, e. Coli and enterococcus.  Pt treated until 01-17-2024 with IV abx. Resolved.  Sepsis with acute renal failure without septic shock (HCC)-resolved as of 01/31/2024 01-11-2024 present on admission. WBC 16.9, Scr 4.0. UA showed signs of infection  01-11-2024 through 01-17-2024. urine cx grew proteus, e. Coli and enterococcus.  Pt treated until 01-17-2024 with IV abx.  Lacunar infarct, acute (HCC)-resolved as of 01/31/2024 01-12-2024 MRI brain, MRA head that was ordered on admission showed acute lacunar infarcts. On top of his baseline old strokes of bilateral cerebellar, bilateral pontine, right midbrain, bilateral thalamic.  He has old infarct in the right basal ganglia with  encephalomalacia. Neurology was consulted.  01-14-2024. He underwent further workup by neurology including carotid dopplers and echo. Final recs from neurology were: aspirin  81 mg daily and Brilinta  (ticagrelor ) 90 mg bid for 4 weeks(end date February 11, 2024) and then aspirin  alone. Neurology signs off  Normal anion gap metabolic acidosis-resolved as of 01/31/2024 01-11-2024 to 01-17-2024 pt given IVF. Acute metabolic acidosis resolved with IVF hydration.  Uremia due to inadequate renal perfusion-resolved as of 01/31/2024 01-11-2024 to 01-17-2024. admitted for ARF/uremia with a BUN of 214, Scr 4.0. pt given IVF over 5 days. By 9-10-20254 Acute hypernatremia, uremia  and ARF resolved. By 9-10-205 Na of 143, BUN of 16, Scr 1.08.   Acute hypernatremia-resolved as of 01/31/2024 01-11-2024 to 01-17-2024. admitted for ARF/uremia with a BUN of 214, Scr 4.0. pt given IVF over 5 days. By 9-10-20254 Acute hypernatremia, uremia  and ARF resolved. By 9-10-205 Na of 143, BUN of 16, Scr 1.08.   Pressure injury of skin Present on admission. Wound 01/11/24 1900 Pressure Injury Buttocks Left Stage 3 -  Full thickness tissue loss. Subcutaneous fat may be visible but bone, tendon or muscle are NOT exposed. (Active)    Depression 01/31/24 paxil  and remeron  are ordered but pt refusing to take meds.  02/01/24 stable. intermittently takes his meds     Dementia without behavioral disturbance (HCC) 01/31/24 paxil  and remeron  are ordered but pt refusing to take meds.  02/01/24 stable. intermittently takes his meds   Chronic suprapubic catheter (HCC) Chronic. Unknown when it was initially placed, who placed it.  02/01/24 stable.  Type 2 diabetes mellitus with vascular disease (HCC) 01/31/24 on SSI. Intermittently allow nursing staff to give him insulin  injections. CBG are mostly controlled.  02/01/24 intermittently takes his meds  CBG controlled for the most part  Malnutrition of moderate degree Nutrition  Status: Nutrition Problem: Moderate Malnutrition Etiology: chronic illness (dementia) Signs/Symptoms: mild fat depletion, mild muscle depletion Interventions: Nepro shake    Hx of AKA (above knee amputation), right chronic  PAD (peripheral artery disease) 01/31/24 stable. ASA ordered.   02/01/24 stable.   Essential hypertension 01/31/24 norvasc  and coreg  ordered. Pt refusing to take some meds.  02/01/24 intermittently takes his meds. BP elevated due to pt inconsistently taking HTN meds    UGI bleed-resolved as of 01/31/2024 01-17-2024 through 01-20-2024  GI consulted due to melena and anemia. Pt transfused 1 unit PRBC. GI felt pt was as poor candidate for endoscopy procedure. Brilinta  held. By 01-20-2024, GI bleeding had stopped. Pt's niece wanted Brilinta  to be restarted(01-20-2024)  Unwitnessed fall-resolved as of 01/31/2024 01-11-2024 reportedly fell at [facility]. Unable to confirm at time of admission that this really happened.  Acute metabolic encephalopathy-resolved as of 01/31/2024 01-11-2024 until 01-17-2024 due to UTI, Sepsis, CVA, hypernatremia. Resolved.  Sepsis secondary to UTI due to chronic  indwelling catheter-resolved as of 01/31/2024 01-11-2024 present on admission. WBC 16.9, Scr 4.0. UA showed signs of infection   01-11-2024 through 01-17-2024. urine cx grew proteus, e. Coli and enterococcus.  Pt treated until 01-17-2024 with IV abx.  Elevated troponin-resolved as of 01/31/2024 01-12-2024 was felt not to be due to NSTEMI or demand ischemia but rather due to profound renal failure, dehydration. Resolved.   DVT prophylaxis:   ASA   Code Status: Limited: Do not attempt resuscitation (DNR) -DNR-LIMITED -Do Not Intubate/DNI  Family Communication: no family at bedside Disposition Plan: SNF Reason for continuing need for hospitalization: medically stable for DC.  Objective: Vitals:   01/31/24 1717 01/31/24 1949 02/01/24 0500 02/01/24 0735  BP: (!) 150/81 (!) 117/93 (!)  149/80 (!) 169/86  Pulse: 81 77 68 77  Resp: 18 18 16 18   Temp: 98.9 F (37.2 C) 99.1 F (37.3 C) 98.6 F (37 C) 99 F (37.2 C)  TempSrc:  Oral Axillary   SpO2: 100% 99% 99% 99%  Weight:      Height:        Intake/Output Summary (Last 24 hours) at 02/01/2024 1518 Last data filed at 02/01/2024 0536 Gross per 24 hour  Intake --  Output 800 ml  Net -800 ml   Filed Weights   01/11/24 1411  Weight: 89.8 kg   Examination:  Physical Exam Vitals and nursing note reviewed.  Constitutional:      Comments: Chronically ill appearing  HENT:     Head: Normocephalic and atraumatic.  Cardiovascular:     Rate and Rhythm: Normal rate.  Pulmonary:     Effort: Pulmonary effort is normal.  Abdominal:     General: Abdomen is flat.  Musculoskeletal:     Comments: Right AKA Left foot in prevelon boot  Skin:    General: Skin is warm and dry.    Data Reviewed: I have personally reviewed following labs and imaging studies  CBC: Recent Labs  Lab 01/26/24 0355 01/29/24 0450  WBC 8.3 6.3  HGB 8.9* 9.2*  HCT 27.2* 28.0*  MCV 94.4 93.3  PLT 332 364   Basic Metabolic Panel: Recent Labs  Lab 01/26/24 0355 01/29/24 0450  NA 142 141  K 3.9 3.7  CL 105 106  CO2 20* 25  GLUCOSE 106* 108*  BUN 8 10  CREATININE 1.04 1.27*  CALCIUM  8.6* 8.8*   GFR: Estimated Creatinine Clearance: 65.7 mL/min (A) (by C-G formula based on SCr of 1.27 mg/dL (H)). CBG: Recent Labs  Lab 01/31/24 1222 01/31/24 1715 01/31/24 1945 02/01/24 0759 02/01/24 1133  GLUCAP 101* 176* 138* 102* 108*   Scheduled Meds:  amLODipine   5 mg Oral Daily   aspirin  EC  81 mg Oral Daily   atorvastatin   80 mg Oral QHS   carvedilol   12.5 mg Oral BID WC   dutasteride   0.5 mg Oral Daily   feeding supplement (NEPRO CARB STEADY)  237 mL Oral TID BM   insulin  aspart  0-15 Units Subcutaneous TID WC   insulin  aspart  0-5 Units Subcutaneous QHS   leptospermum manuka honey  1 Application Topical Daily   mirtazapine   15  mg Oral QHS   mouth rinse  15 mL Mouth Rinse 4 times per day   pantoprazole   40 mg Oral BID   PARoxetine   10 mg Oral Daily   sucralfate   1 g Oral TID WC & HS   tamsulosin   0.8 mg Oral Daily   ticagrelor   90 mg Oral BID  Continuous Infusions:   LOS: 21 days   Time spent: 55 minutes  Camellia Door, DO  [facility]  02/01/2024, 3:18 PM

## 2024-02-01 NOTE — Plan of Care (Signed)
  Problem: Nutrition: Goal: Adequate nutrition will be maintained Outcome: Not Progressing   Problem: Coping: Goal: Level of anxiety will decrease Outcome: Not Progressing   Problem: Elimination: Goal: Will not experience complications related to bowel motility Outcome: Not Progressing

## 2024-02-02 DIAGNOSIS — Z7189 Other specified counseling: Secondary | ICD-10-CM | POA: Diagnosis not present

## 2024-02-02 DIAGNOSIS — F32A Depression, unspecified: Secondary | ICD-10-CM | POA: Diagnosis not present

## 2024-02-02 DIAGNOSIS — F0390 Unspecified dementia without behavioral disturbance: Secondary | ICD-10-CM | POA: Diagnosis not present

## 2024-02-02 DIAGNOSIS — Z9359 Other cystostomy status: Secondary | ICD-10-CM | POA: Diagnosis not present

## 2024-02-02 LAB — GLUCOSE, CAPILLARY
Glucose-Capillary: 98 mg/dL (ref 70–99)
Glucose-Capillary: 145 mg/dL — ABNORMAL HIGH (ref 70–99)
Glucose-Capillary: 99 mg/dL (ref 70–99)
Glucose-Capillary: 89 mg/dL (ref 70–99)

## 2024-02-02 NOTE — Plan of Care (Signed)
 Problem: Education: Goal: Ability to describe self-care measures that may prevent or decrease complications (Diabetes Survival Skills Education) will improve Outcome: Progressing   Problem: Coping: Goal: Ability to adjust to condition or change in health will improve Outcome: Progressing   Problem: Fluid Volume: Goal: Ability to maintain a balanced intake and output will improve Outcome: Progressing   Problem: Health Behavior/Discharge Planning: Goal: Ability to identify and utilize available resources and services will improve Outcome: Progressing Goal: Ability to manage health-related needs will improve Outcome: Progressing   Problem: Metabolic: Goal: Ability to maintain appropriate glucose levels will improve Outcome: Progressing   Problem: Nutritional: Goal: Maintenance of adequate nutrition will improve Outcome: Progressing Goal: Progress toward achieving an optimal weight will improve Outcome: Progressing   Problem: Skin Integrity: Goal: Risk for impaired skin integrity will decrease Outcome: Progressing   Problem: Tissue Perfusion: Goal: Adequacy of tissue perfusion will improve Outcome: Progressing   Problem: Education: Goal: Knowledge of General Education information will improve Description: Including pain rating scale, medication(s)/side effects and non-pharmacologic comfort measures Outcome: Progressing   Problem: Health Behavior/Discharge Planning: Goal: Ability to manage health-related needs will improve Outcome: Progressing   Problem: Clinical Measurements: Goal: Ability to maintain clinical measurements within normal limits will improve Outcome: Progressing Goal: Will remain free from infection Outcome: Progressing Goal: Diagnostic test results will improve Outcome: Progressing Goal: Respiratory complications will improve Outcome: Progressing Goal: Cardiovascular complication will be avoided Outcome: Progressing   Problem: Activity: Goal:  Risk for activity intolerance will decrease Outcome: Progressing   Problem: Nutrition: Goal: Adequate nutrition will be maintained Outcome: Progressing   Problem: Coping: Goal: Level of anxiety will decrease Outcome: Progressing   Problem: Elimination: Goal: Will not experience complications related to bowel motility Outcome: Progressing Goal: Will not experience complications related to urinary retention Outcome: Progressing   Problem: Pain Managment: Goal: General experience of comfort will improve and/or be controlled Outcome: Progressing   Problem: Safety: Goal: Ability to remain free from injury will improve Outcome: Progressing   Problem: Skin Integrity: Goal: Risk for impaired skin integrity will decrease Outcome: Progressing   Problem: Education: Goal: Knowledge of disease or condition will improve Outcome: Progressing Goal: Knowledge of secondary prevention will improve (MUST DOCUMENT ALL) Outcome: Progressing Goal: Knowledge of patient specific risk factors will improve (DELETE if not current risk factor) Outcome: Progressing   Problem: Ischemic Stroke/TIA Tissue Perfusion: Goal: Complications of ischemic stroke/TIA will be minimized Outcome: Progressing   Problem: Coping: Goal: Will verbalize positive feelings about self Outcome: Progressing Goal: Will identify appropriate support needs Outcome: Progressing   Problem: Health Behavior/Discharge Planning: Goal: Ability to manage health-related needs will improve Outcome: Progressing Goal: Goals will be collaboratively established with patient/family Outcome: Progressing   Problem: Self-Care: Goal: Ability to participate in self-care as condition permits will improve Outcome: Progressing Goal: Verbalization of feelings and concerns over difficulty with self-care will improve Outcome: Progressing Goal: Ability to communicate needs accurately will improve Outcome: Progressing   Problem:  Nutrition: Goal: Risk of aspiration will decrease Outcome: Progressing Goal: Dietary intake will improve Outcome: Progressing   Problem: Education: Goal: Knowledge of disease or condition will improve Outcome: Progressing Goal: Knowledge of secondary prevention will improve (MUST DOCUMENT ALL) Outcome: Progressing Goal: Knowledge of patient specific risk factors will improve (DELETE if not current risk factor) Outcome: Progressing   Problem: Ischemic Stroke/TIA Tissue Perfusion: Goal: Complications of ischemic stroke/TIA will be minimized Outcome: Progressing   Problem: Coping: Goal: Will verbalize positive feelings about self Outcome: Progressing Goal: Will  identify appropriate support needs Outcome: Progressing   Problem: Health Behavior/Discharge Planning: Goal: Ability to manage health-related needs will improve Outcome: Progressing Goal: Goals will be collaboratively established with patient/family Outcome: Progressing   Problem: Self-Care: Goal: Ability to participate in self-care as condition permits will improve Outcome: Progressing Goal: Verbalization of feelings and concerns over difficulty with self-care will improve Outcome: Progressing Goal: Ability to communicate needs accurately will improve Outcome: Progressing

## 2024-02-02 NOTE — NC FL2 (Signed)
 [place]  MEDICAID FL2 LEVEL OF CARE FORM     IDENTIFICATION  Patient Name: Ryan Hughes Birthdate: [birth year] Sex: male Admission Date (Current Location): 01/11/2024  [facility] and [place] Number:  Producer, television/film/video and Address:  [facility] [place]. [facility], [address], [place], [place] [address]      Provider Number: [id number]  Attending Physician Name and Address:  Laurence Locus, DO  Relative Name and Phone Number:  [name]/niece    Current Level of Care: Hospital Recommended Level of Care: Skilled Nursing Facility Prior Approval Number:    Date Approved/Denied:   PASRR Number: [id number]  Discharge Plan: SNF    Current Diagnoses: Patient Active Problem List   Diagnosis Date Noted   Goals of care, counseling/discussion 01/31/2024   Malnutrition of moderate degree 01/16/2024   Poor dentition 01/12/2024   Pressure injury of skin 01/12/2024   Chronic suprapubic catheter (HCC) 01/11/2024   Sleep apnea 01/11/2024   Dementia without behavioral disturbance (HCC) 01/11/2024   Depression 01/11/2024   Hx of AKA (above knee amputation), right 01/11/2024   PAD (peripheral artery disease) 02/17/2022   Anemia of chronic disease 02/01/2022   Type 2 diabetes mellitus with vascular disease (HCC) 03/27/2021   Essential hypertension 08/05/2020   Mixed hyperlipidemia due to type 2 diabetes mellitus (HCC) 04/25/2020   Benign prostatic hyperplasia (BPH) with straining on urination 04/25/2020   Nicotine  dependence, cigarettes, uncomplicated 04/25/2020   History of CVA (cerebrovascular accident) 04/25/2020    Orientation RESPIRATION BLADDER Height & Weight     Self  Normal Continent Weight: 198 lb (89.8 kg) Height:  5' 6 (167.6 cm)  BEHAVIORAL SYMPTOMS/MOOD NEUROLOGICAL BOWEL NUTRITION STATUS      Incontinent Diet (please refer to dc summary)  AMBULATORY STATUS COMMUNICATION OF NEEDS Skin   Extensive Assist Verbally Other (Comment) (pressure injury buttocks left  stage 3)                       Personal Care Assistance Level of Assistance  Bathing, Feeding, Dressing Bathing Assistance: Limited assistance Feeding assistance: Limited assistance Dressing Assistance: Limited assistance     Functional Limitations Info  Hearing, Speech, Sight Sight Info: Impaired (both eyes) Hearing Info: Impaired (both ears) Speech Info: Impaired (difficulty speaking)    SPECIAL CARE FACTORS FREQUENCY                       Contractures Contractures Info: Not present    Additional Factors Info  Code Status, Allergies Code Status Info: DNR Allergies Info: Pork, shellfish           Current Medications (02/02/2024):  This is the current hospital active medication list Current Facility-Administered Medications  Medication Dose Route Frequency Provider Last Rate Last Admin   acetaminophen  (TYLENOL ) tablet 650 mg  650 mg Oral Q6H PRN Patel, Pranav M, MD       Or   acetaminophen  (TYLENOL ) suppository 650 mg  650 mg Rectal Q6H PRN Patel, Pranav M, MD       amLODipine  (NORVASC ) tablet 5 mg  5 mg Oral Daily Sundil, Subrina, MD   5 mg at 02/02/24 0949   aspirin  EC tablet 81 mg  81 mg Oral Daily Patel, Pranav M, MD   81 mg at 02/02/24 0950   atorvastatin  (LIPITOR ) tablet 80 mg  80 mg Oral QHS Patel, Pranav M, MD   80 mg at 01/31/24 2007   carvedilol  (COREG ) tablet 12.5 mg  12.5  mg Oral BID WC Patel, Pranav M, MD   12.5 mg at 02/02/24 9049   dutasteride  (AVODART ) capsule 0.5 mg  0.5 mg Oral Daily Patel, Pranav M, MD   0.5 mg at 02/02/24 0950   feeding supplement (NEPRO CARB STEADY) liquid 237 mL  237 mL Oral TID BM Patel, Pranav M, MD   237 mL at 02/02/24 9048   insulin  aspart (novoLOG ) injection 0-15 Units  0-15 Units Subcutaneous TID WC Vann, Jessica U, DO   2 Units at 02/02/24 1223   insulin  aspart (novoLOG ) injection 0-5 Units  0-5 Units Subcutaneous QHS Vann, Jessica U, DO       leptospermum manuka honey (MEDIHONEY) paste 1 Application  1 Application  Topical Daily Rai, Ripudeep K, MD   1 Application at 02/02/24 1152   mirtazapine  (REMERON  SOL-TAB) disintegrating tablet 15 mg  15 mg Oral QHS Vann, Jessica U, DO   15 mg at 01/31/24 2007   ondansetron  (ZOFRAN ) tablet 4 mg  4 mg Oral Q6H PRN Patel, Pranav M, MD   4 mg at 01/15/24 9193   Oral care mouth rinse  15 mL Mouth Rinse 4 times per day Fairy Frames, MD   15 mL at 02/02/24 1224   Oral care mouth rinse  15 mL Mouth Rinse PRN Fairy Frames, MD       oxyCODONE  (Oxy IR/ROXICODONE ) immediate release tablet 5 mg  5 mg Oral Q4H PRN Patel, Pranav M, MD   5 mg at 01/18/24 1640   pantoprazole  (PROTONIX ) EC tablet 40 mg  40 mg Oral BID Vann, Jessica U, DO   40 mg at 02/02/24 0950   PARoxetine  (PAXIL ) tablet 10 mg  10 mg Oral Daily Joseph, Preetha, MD   10 mg at 02/02/24 9050   simethicone  (MYLICON) chewable tablet 80 mg  80 mg Oral QID PRN Vann, Jessica U, DO       sucralfate  (CARAFATE ) 1 GM/10ML suspension 1 g  1 g Oral TID WC & HS Parker, Alicia C, NP   1 g at 02/02/24 1225   tamsulosin  (FLOMAX ) capsule 0.8 mg  0.8 mg Oral Daily Patel, Pranav M, MD   0.8 mg at 02/02/24 0950   ticagrelor  (BRILINTA ) tablet 90 mg  90 mg Oral BID Laurence Locus, DO   90 mg at 02/02/24 9049     Discharge Medications: Please see discharge summary for a list of discharge medications.  Relevant Imaging Results:  Relevant Lab Results:   Additional Information SSN # [id number], [place]

## 2024-02-02 NOTE — Progress Notes (Signed)
 PROGRESS NOTE    Ryan Hughes  FMW:[id number] DOB: 05-23-1965 DOA: 01/11/2024 PCP: [name], MD  Subjective: Pt seen and examined. Opens his eyes briefly and tracks me across room. But then closes his eyes when I start to ask him questions. Breakfast tray is untouched.   Hospital Course: CC: confusion. Per EMS, Pt, from [facility], presents d/t increasing AMS and sweating x4 days.  Pt noted to be hypotensive.  Pt has a suprapubic catheter. Family reports Pt normally gets around well in a wheelchair and is talkative.   HPI: 58 yo AAM with PMH of dementia, depression, CVA, HLD, type II DM, PVD with right AKA, wheelchair-bound, BPH with suprapubic catheter present to the hospital with complaints of altered mental status.  History was obtained from family was at bedside.  Patient is unable to provide history secondary to confusion and unable to reach staff at the [facility].  Per EMS reportedly patient had confusion progressively worsening for last 4 days.  Per niece 2 days ago patient stopped taking his medications.  As the patient progressively decline [facility] staff sent the patient to hospital for further evaluation. Patient at his baseline is in wheelchair, normally carries out a conversation with clear speech but confused thoughts due to his dementia.  Patient pushes his wheelchair himself per niece. Patient has history of slipping out of his bed when family is visiting him.  And reportedly had a fall 2 weeks ago. No recent change in medications reported by the family. Per RN his suprapubic catheter was with significant sediments.  Significant Events: Admitted 01/11/2024 for acute hypernatremia, AKI 01-11-2024 until 9-10-20254 Acute hypernatremia, uremia  and ARF resolved with IVF. Na of 143, BUN of 16, Scr 1.08. pt UTI due to indwelling suprapubic tube treated with IV unasyn . Stopped on 01-17-2024 01-12-2024 to 01-14-2024  MRI brain shows acute lacunar  strokes. Neurology consulted. Cva work up proceeds. Echo normal. Carotid dopplers minimal stenosis. Final recs are:aspirin  81 mg daily and Brilinta  (ticagrelor ) 90 mg bid for 4 weeks and then aspirin  alone  01-17-2024 GI consulted due to melena and anemia. Pt transfused 1 unit PRBC. GI felt pt was as poor candidate for endoscopy procedure. 01-18-2024 palliative care consulted for GOC Week of 01-17-2024 through 01-23-2024 GI bleeding stops after Brilinta  held.  Pt's niece [name] wanted Brilinta  to be restarted(01-20-2024) Week of 01-24-2024 through 01-30-2024. Pt refusing to take meds as well as po food/liquids. He is combatative with nursing staff who are providing basic care including changing bed linens and cleaning around his suprapubic tube  Admission Labs: Na 162, K 4.5, CO2 of 19, BUN 214, Scr 4.0 T prot 6.5, alb 3.2, AST 19, ALT 22, alk phos 74, t. Bili 0.5 WBC 16.9, HgB 9.5, plt 301 Covid/rsv/flu negative VBG pH 7.29, PCO2 of 44 HIV non-reactive TSH 0.892, FT4 1.08 NH3 of 17  Admission Imaging Studies: CXR Hypoinflation. No acute findings.  CT head No acute intracranial abnormality. 2. Stable atrophy and chronic small vessel ischemic changes. Remote lacunar infarcts in the bilateral basal ganglia Renal U/S Increased bilateral renal cortical echogenicity, compatible with medical renal disease. 2. Trace right perinephric fluid.  No hydronephrosis   Significant Labs: 01-11-2024 Vit b12 of 1115, folate > 20, vit B1 of 136 01-13-2024 A1c of 6.1%  Significant Imaging Studies: 01-12-2024 MRI Brain There are multiple old lacunar infarcts including bilateral cerebellar, bilateral pontine, right midbrain, bilateral thalamic. There is an old infarct in the basal ganglia on the  right with encephalomalacia. With the exception of the basal ganglia infarct, all of these have developed since the prior MRI study from 2022. There are several acute lacunar infarcts involving the left frontal lobe and left  posterior temporal/parietal lobe, and a single acute lacunar infarct in the right frontal lobe 01-12-2024 MRA head No large vessel occlusion or proximal hemodynamically significant stenosis 01-15-2024 abd xr Mildly dilated loops of jejunum in the left mid abdomen. This could be due to a focal ileus or mild partial small bowel obstructio 01-15-2024 CT abd/pelvis No acute findings or explanation for the patient's symptoms. No evidence of bowel obstruction. 2. New small bilateral pleural effusions with adjacent dependent airspace opacities, likely atelectasis. 3. Distal esophageal wall thickening, similar to previous study, suggesting esophagitis or chronic reflux. Correlate clinically. 4. Suprapubic bladder catheter in place with possible mild bladder wall thickening. 5. Aortic Atherosclerosis (ICD10-I70.0). Interval postsurgical changes of the right groin consistent with femoral bypass procedure.  Antibiotic Therapy: Anti-infectives (From admission, onward)    Start     Dose/Rate Route Frequency Ordered Stop   01/13/24 1515  Ampicillin -Sulbactam (UNASYN ) 3 g in sodium chloride  0.9 % 100 mL IVPB        3 g 200 mL/hr over 30 Minutes Intravenous Every 6 hours 01/13/24 1430 01/17/24 2209   01/12/24 1200  piperacillin -tazobactam (ZOSYN ) IVPB 3.375 g  Status:  Discontinued        3.375 g 12.5 mL/hr over 240 Minutes Intravenous Every 8 hours 01/12/24 0737 01/13/24 1102   01/12/24 1000  linezolid  (ZYVOX ) IVPB 600 mg  Status:  Discontinued        600 mg 300 mL/hr over 60 Minutes Intravenous Every 12 hours 01/11/24 1659 01/12/24 1448   01/12/24 0600  piperacillin -tazobactam (ZOSYN ) IVPB 2.25 g  Status:  Discontinued        2.25 g 100 mL/hr over 30 Minutes Intravenous Every 8 hours 01/11/24 1746 01/12/24 0737   01/11/24 2200  linezolid  (ZYVOX ) IVPB 600 mg  Status:  Discontinued        600 mg 300 mL/hr over 60 Minutes Intravenous Every 12 hours 01/11/24 1659 01/11/24 1659   01/11/24 1415  ceFEPIme   (MAXIPIME ) 2 g in sodium chloride  0.9 % 100 mL IVPB        2 g 200 mL/hr over 30 Minutes Intravenous  Once 01/11/24 1402 01/11/24 1532   01/11/24 1415  metroNIDAZOLE  (FLAGYL ) IVPB 500 mg        500 mg 100 mL/hr over 60 Minutes Intravenous  Once 01/11/24 1402 01/11/24 1647   01/11/24 1415  vancomycin  (VANCOCIN ) IVPB 1000 mg/200 mL premix  Status:  Discontinued        1,000 mg 200 mL/hr over 60 Minutes Intravenous  Once 01/11/24 1402 01/11/24 1406   01/11/24 1415  vancomycin  (VANCOREADY) IVPB 1500 mg/300 mL        1,500 mg 150 mL/hr over 120 Minutes Intravenous  Once 01/11/24 1407 01/11/24 1854       Procedures:   Consultants: Palliative care Neurology GI    Assessment and Plan: * Acute renal failure (ARF)-resolved as of 01/31/2024 01-11-2024 to 01-17-2024. admitted for ARF/uremia with a BUN of 214, Scr 4.0. pt given IVF over 5 days. By 9-10-20254 Acute hypernatremia, uremia  and ARF resolved. By 9-10-205 Na of 143, BUN of 16, Scr 1.08.   Goals of care, counseling/discussion 01-31-2024 today, palliative care successful in getting pt's niece to agree to changing pt's code status to DNR/DNI. CM/TOC attempting SNF placement.  02/02/24 still awaiting SNF placement.   UTI (urinary tract infection) due to urinary indwelling catheter (suprapubic tube)-resolved as of 01/31/2024 01-11-2024 through 01-17-2024. urine cx grew proteus, e. Coli and enterococcus.  Pt treated until 01-17-2024 with IV abx. Resolved.  Sepsis with acute renal failure without septic shock (HCC)-resolved as of 01/31/2024 01-11-2024 present on admission. WBC 16.9, Scr 4.0. UA showed signs of infection  01-11-2024 through 01-17-2024. urine cx grew proteus, e. Coli and enterococcus.  Pt treated until 01-17-2024 with IV abx.  Lacunar infarct, acute (HCC)-resolved as of 01/31/2024 01-12-2024 MRI brain, MRA head that was ordered on admission showed acute lacunar infarcts. On top of his baseline old strokes of bilateral cerebellar,  bilateral pontine, right midbrain, bilateral thalamic.  He has old infarct in the right basal ganglia with encephalomalacia. Neurology was consulted.  01-14-2024. He underwent further workup by neurology including carotid dopplers and echo. Final recs from neurology were: aspirin  81 mg daily and Brilinta  (ticagrelor ) 90 mg bid for 4 weeks(end date February 11, 2024) and then aspirin  alone. Neurology signs off  Normal anion gap metabolic acidosis-resolved as of 01/31/2024 01-11-2024 to 01-17-2024 pt given IVF. Acute metabolic acidosis resolved with IVF hydration.  Uremia due to inadequate renal perfusion-resolved as of 01/31/2024 01-11-2024 to 01-17-2024. admitted for ARF/uremia with a BUN of 214, Scr 4.0. pt given IVF over 5 days. By 9-10-20254 Acute hypernatremia, uremia  and ARF resolved. By 9-10-205 Na of 143, BUN of 16, Scr 1.08.   Acute hypernatremia-resolved as of 01/31/2024 01-11-2024 to 01-17-2024. admitted for ARF/uremia with a BUN of 214, Scr 4.0. pt given IVF over 5 days. By 9-10-20254 Acute hypernatremia, uremia  and ARF resolved. By 9-10-205 Na of 143, BUN of 16, Scr 1.08.   Pressure injury of skin Present on admission. Wound 01/11/24 1900 Pressure Injury Buttocks Left Stage 3 -  Full thickness tissue loss. Subcutaneous fat may be visible but bone, tendon or muscle are NOT exposed. (Active)    Depression 01/31/24 paxil  and remeron  are ordered but pt refusing to take meds.  02/01/24 stable. intermittently takes his meds    02/02/24 stable. Took most of his AM meds yesterday  Dementia without behavioral disturbance (HCC) 01/31/24 paxil  and remeron  are ordered but pt refusing to take meds.  02/01/24 stable. intermittently takes his meds   02/02/24 stable. Took most of his AM meds yesterday  Chronic suprapubic catheter (HCC) Chronic. Unknown when it was initially placed, who placed it.  02/01/24 stable.  02/02/24 stable  Type 2 diabetes mellitus with vascular disease  (HCC) 01/31/24 on SSI. Intermittently allow nursing staff to give him insulin  injections. CBG are mostly controlled.  02/01/24 intermittently takes his meds  CBG controlled for the most part  02/02/24 CBG stable   Malnutrition of moderate degree Nutrition Status: Nutrition Problem: Moderate Malnutrition Etiology: chronic illness (dementia) Signs/Symptoms: mild fat depletion, mild muscle depletion Interventions: Nepro shake    Hx of AKA (above knee amputation), right chronic  PAD (peripheral artery disease) 01/31/24 stable. ASA ordered.   02/01/24 stable.  02/02/24 stable.   Essential hypertension 01/31/24 norvasc  and coreg  ordered. Pt refusing to take some meds.  02/01/24 intermittently takes his meds. BP elevated due to pt inconsistently taking HTN meds  02/02/24 stable. Took most of his AM meds yesterday    UGI bleed-resolved as of 01/31/2024 01-17-2024 through 01-20-2024  GI consulted due to melena and anemia. Pt transfused 1 unit PRBC. GI felt pt was as poor candidate for endoscopy procedure. Brilinta  held.  By 01-20-2024, GI bleeding had stopped. Pt's niece wanted Brilinta  to be restarted(01-20-2024)  Unwitnessed fall-resolved as of 01/31/2024 01-11-2024 reportedly fell at [facility] Unable to confirm at time of admission that this really happened.  Acute metabolic encephalopathy-resolved as of 01/31/2024 01-11-2024 until 01-17-2024 due to UTI, Sepsis, CVA, hypernatremia. Resolved.  Sepsis secondary to UTI due to chronic indwelling catheter-resolved as of 01/31/2024 01-11-2024 present on admission. WBC 16.9, Scr 4.0. UA showed signs of infection   01-11-2024 through 01-17-2024. urine cx grew proteus, e. Coli and enterococcus.  Pt treated until 01-17-2024 with IV abx.  Elevated troponin-resolved as of 01/31/2024 01-12-2024 was felt not to be due to NSTEMI or demand ischemia but rather due to profound renal failure, dehydration. Resolved.  DVT prophylaxis:   ASA, brilinta     Code Status: Limited: Do not attempt resuscitation (DNR) -DNR-LIMITED -Do Not Intubate/DNI  Family Communication: no family at bedside Disposition Plan: SNF Reason for continuing need for hospitalization: medically stable for DC  Objective: Vitals:   02/01/24 2054 02/01/24 2058 02/02/24 0511 02/02/24 0810  BP: (!) 155/76 (!) 155/76 124/78 121/76  Pulse: 73 73 74 75  Resp:    18  Temp:  98.9 F (37.2 C) 98.8 F (37.1 C) 98.3 F (36.8 C)  TempSrc:    Oral  SpO2: 100% 100% 100% 100%  Weight:      Height:       No intake or output data in the 24 hours ending 02/02/24 1135 Filed Weights   01/11/24 1411  Weight: 89.8 kg    Examination:  Physical Exam Vitals and nursing note reviewed.  Constitutional:      Comments: Chronically ill appearing  Opens his eyes briefly and tracks me across room. But then closes his eyes when I start to ask him questions. Breakfast tray is untouched.    HENT:     Head: Normocephalic.  Cardiovascular:     Rate and Rhythm: Normal rate and regular rhythm.  Pulmonary:     Effort: Pulmonary effort is normal. No respiratory distress.  Abdominal:     General: Abdomen is flat.  Genitourinary:    Comments: +suprapubic tube. Urinary bag with dark yellow urine    Data Reviewed: I have personally reviewed following labs and imaging studies  CBC: Recent Labs  Lab 01/29/24 0450  WBC 6.3  HGB 9.2*  HCT 28.0*  MCV 93.3  PLT 364   Basic Metabolic Panel: Recent Labs  Lab 01/29/24 0450  NA 141  K 3.7  CL 106  CO2 25  GLUCOSE 108*  BUN 10  CREATININE 1.27*  CALCIUM  8.8*   GFR: Estimated Creatinine Clearance: 65.7 mL/min (A) (by C-G formula based on SCr of 1.27 mg/dL (H)). CBG: Recent Labs  Lab 01/31/24 1945 02/01/24 0759 02/01/24 1133 02/01/24 2056 02/02/24 0957  GLUCAP 138* 102* 108* 119* 98   Scheduled Meds:  amLODipine   5 mg Oral Daily   aspirin  EC  81 mg Oral Daily   atorvastatin   80 mg Oral QHS   carvedilol   12.5 mg Oral  BID WC   dutasteride   0.5 mg Oral Daily   feeding supplement (NEPRO CARB STEADY)  237 mL Oral TID BM   insulin  aspart  0-15 Units Subcutaneous TID WC   insulin  aspart  0-5 Units Subcutaneous QHS   leptospermum manuka honey  1 Application Topical Daily   mirtazapine   15 mg Oral QHS   mouth rinse  15 mL Mouth Rinse 4 times per day   pantoprazole   40 mg Oral BID   PARoxetine   10 mg Oral Daily   sucralfate   1 g Oral TID WC & HS   tamsulosin   0.8 mg Oral Daily   ticagrelor   90 mg Oral BID   Continuous Infusions:   LOS: 22 days   Time spent: 55 minutes  Camellia Door, DO  [facility]  02/02/2024, 11:35 AM

## 2024-02-02 NOTE — TOC Progression Note (Signed)
 Transition of Care [facility]) - Progression Note    Patient Details  Name: Ryan Hughes MRN: [id number] Date of Birth: 06-Feb-1966  Transition of Care [facility] CM/SW Contact  Sherline Clack, [place] Phone Number: 02/02/2024, 2:08 PM  Clinical Narrative:     CSW continuing to follow patient while placement is determined.   Expected Discharge Plan: Skilled Nursing Facility Barriers to Discharge: Continued Medical Work up               Expected Discharge Plan and Services     Post Acute Care Choice: Skilled Nursing Facility Living arrangements for the past 2 months: Skilled Nursing Facility                                       Social Drivers of Health (SDOH) Interventions SDOH Screenings   Food Insecurity: Patient Unable To Answer (01/11/2024)  Housing: Unknown (01/11/2024)  Transportation Needs: Patient Unable To Answer (01/11/2024)  Utilities: Patient Unable To Answer (01/11/2024)  Depression (PHQ2-9): High Risk (12/08/2021)  Tobacco Use: Medium Risk (01/11/2024)    Readmission Risk Interventions     No data to display

## 2024-02-03 DIAGNOSIS — Z9359 Other cystostomy status: Secondary | ICD-10-CM | POA: Diagnosis not present

## 2024-02-03 DIAGNOSIS — F0390 Unspecified dementia without behavioral disturbance: Secondary | ICD-10-CM | POA: Diagnosis not present

## 2024-02-03 DIAGNOSIS — Z7189 Other specified counseling: Secondary | ICD-10-CM | POA: Diagnosis not present

## 2024-02-03 DIAGNOSIS — I10 Essential (primary) hypertension: Secondary | ICD-10-CM | POA: Diagnosis not present

## 2024-02-03 LAB — GLUCOSE, CAPILLARY: Glucose-Capillary: 126 mg/dL — ABNORMAL HIGH (ref 70–99)

## 2024-02-03 NOTE — Progress Notes (Signed)
 PROGRESS NOTE    Ryan Hughes  FMW:[id number] DOB: 11/20/1965 DOA: 01/11/2024 PCP: [name], MD  Subjective: Pt seen and examined. Eyes are open. Appears he drank about 12-16 ounces of fluid. Breakfast tray with only a minimal amount eaten. Took about 1/2 his meds yesterday and this AM.   Hospital Course: CC: confusion. Per EMS, Pt, from [facility], presents d/t increasing AMS and sweating x4 days.  Pt noted to be hypotensive.  Pt has a suprapubic catheter. Family reports Pt normally gets around well in a wheelchair and is talkative.   HPI: 58 yo AAM with PMH of dementia, depression, CVA, HLD, type II DM, PVD with right AKA, wheelchair-bound, BPH with suprapubic catheter present to the hospital with complaints of altered mental status.  History was obtained from family was at bedside.  Patient is unable to provide history secondary to confusion and unable to reach staff at the [facility].  Per EMS reportedly patient had confusion progressively worsening for last 4 days.  Per niece 2 days ago patient stopped taking his medications.  As the patient progressively decline [facility] staff sent the patient to hospital for further evaluation. Patient at his baseline is in wheelchair, normally carries out a conversation with clear speech but confused thoughts due to his dementia.  Patient pushes his wheelchair himself per niece. Patient has history of slipping out of his bed when family is visiting him.  And reportedly had a fall 2 weeks ago. No recent change in medications reported by the family. Per RN his suprapubic catheter was with significant sediments.  Significant Events: Admitted 01/11/2024 for acute hypernatremia, AKI 01-11-2024 until 9-10-20254 Acute hypernatremia, uremia  and ARF resolved with IVF. Na of 143, BUN of 16, Scr 1.08. pt UTI due to indwelling suprapubic tube treated with IV unasyn . Stopped on 01-17-2024 01-12-2024 to 01-14-2024  MRI brain shows  acute lacunar strokes. Neurology consulted. Cva work up proceeds. Echo normal. Carotid dopplers minimal stenosis. Final recs are:aspirin  81 mg daily and Brilinta  (ticagrelor ) 90 mg bid for 4 weeks and then aspirin  alone  01-17-2024 GI consulted due to melena and anemia. Pt transfused 1 unit PRBC. GI felt pt was as poor candidate for endoscopy procedure. 01-18-2024 palliative care consulted for GOC Week of 01-17-2024 through 01-23-2024 GI bleeding stops after Brilinta  held.  Pt's niece [name] wanted Brilinta  to be restarted(01-20-2024) Week of 01-24-2024 through 01-30-2024. Pt refusing to take meds as well as po food/liquids. He is combatative with nursing staff who are providing basic care including changing bed linens and cleaning around his suprapubic tube  Admission Labs: Na 162, K 4.5, CO2 of 19, BUN 214, Scr 4.0 T prot 6.5, alb 3.2, AST 19, ALT 22, alk phos 74, t. Bili 0.5 WBC 16.9, HgB 9.5, plt 301 Covid/rsv/flu negative VBG pH 7.29, PCO2 of 44 HIV non-reactive TSH 0.892, FT4 1.08 NH3 of 17  Admission Imaging Studies: CXR Hypoinflation. No acute findings.  CT head No acute intracranial abnormality. 2. Stable atrophy and chronic small vessel ischemic changes. Remote lacunar infarcts in the bilateral basal ganglia Renal U/S Increased bilateral renal cortical echogenicity, compatible with medical renal disease. 2. Trace right perinephric fluid.  No hydronephrosis   Significant Labs: 01-11-2024 Vit b12 of 1115, folate > 20, vit B1 of 136 01-13-2024 A1c of 6.1%  Significant Imaging Studies: 01-12-2024 MRI Brain There are multiple old lacunar infarcts including bilateral cerebellar, bilateral pontine, right midbrain, bilateral thalamic. There is an old infarct in the basal  ganglia on the right with encephalomalacia. With the exception of the basal ganglia infarct, all of these have developed since the prior MRI study from 2022. There are several acute lacunar infarcts involving the left frontal  lobe and left posterior temporal/parietal lobe, and a single acute lacunar infarct in the right frontal lobe 01-12-2024 MRA head No large vessel occlusion or proximal hemodynamically significant stenosis 01-15-2024 abd xr Mildly dilated loops of jejunum in the left mid abdomen. This could be due to a focal ileus or mild partial small bowel obstructio 01-15-2024 CT abd/pelvis No acute findings or explanation for the patient's symptoms. No evidence of bowel obstruction. 2. New small bilateral pleural effusions with adjacent dependent airspace opacities, likely atelectasis. 3. Distal esophageal wall thickening, similar to previous study, suggesting esophagitis or chronic reflux. Correlate clinically. 4. Suprapubic bladder catheter in place with possible mild bladder wall thickening. 5. Aortic Atherosclerosis (ICD10-I70.0). Interval postsurgical changes of the right groin consistent with femoral bypass procedure.  Antibiotic Therapy: Anti-infectives (From admission, onward)    Start     Dose/Rate Route Frequency Ordered Stop   01/13/24 1515  Ampicillin -Sulbactam (UNASYN ) 3 g in sodium chloride  0.9 % 100 mL IVPB        3 g 200 mL/hr over 30 Minutes Intravenous Every 6 hours 01/13/24 1430 01/17/24 2209   01/12/24 1200  piperacillin -tazobactam (ZOSYN ) IVPB 3.375 g  Status:  Discontinued        3.375 g 12.5 mL/hr over 240 Minutes Intravenous Every 8 hours 01/12/24 0737 01/13/24 1102   01/12/24 1000  linezolid  (ZYVOX ) IVPB 600 mg  Status:  Discontinued        600 mg 300 mL/hr over 60 Minutes Intravenous Every 12 hours 01/11/24 1659 01/12/24 1448   01/12/24 0600  piperacillin -tazobactam (ZOSYN ) IVPB 2.25 g  Status:  Discontinued        2.25 g 100 mL/hr over 30 Minutes Intravenous Every 8 hours 01/11/24 1746 01/12/24 0737   01/11/24 2200  linezolid  (ZYVOX ) IVPB 600 mg  Status:  Discontinued        600 mg 300 mL/hr over 60 Minutes Intravenous Every 12 hours 01/11/24 1659 01/11/24 1659   01/11/24 1415   ceFEPIme  (MAXIPIME ) 2 g in sodium chloride  0.9 % 100 mL IVPB        2 g 200 mL/hr over 30 Minutes Intravenous  Once 01/11/24 1402 01/11/24 1532   01/11/24 1415  metroNIDAZOLE  (FLAGYL ) IVPB 500 mg        500 mg 100 mL/hr over 60 Minutes Intravenous  Once 01/11/24 1402 01/11/24 1647   01/11/24 1415  vancomycin  (VANCOCIN ) IVPB 1000 mg/200 mL premix  Status:  Discontinued        1,000 mg 200 mL/hr over 60 Minutes Intravenous  Once 01/11/24 1402 01/11/24 1406   01/11/24 1415  vancomycin  (VANCOREADY) IVPB 1500 mg/300 mL        1,500 mg 150 mL/hr over 120 Minutes Intravenous  Once 01/11/24 1407 01/11/24 1854       Procedures:   Consultants: Palliative care Neurology GI    Assessment and Plan: * Acute renal failure (ARF)-resolved as of 01/31/2024 01-11-2024 to 01-17-2024. admitted for ARF/uremia with a BUN of 214, Scr 4.0. pt given IVF over 5 days. By 9-10-20254 Acute hypernatremia, uremia  and ARF resolved. By 9-10-205 Na of 143, BUN of 16, Scr 1.08.   Goals of care, counseling/discussion 01-31-2024 today, palliative care successful in getting pt's niece to agree to changing pt's code status to DNR/DNI. CM/TOC  attempting SNF placement.  02/02/24 still awaiting SNF placement.  02/03/24 awaiting SNF placement   UTI (urinary tract infection) due to urinary indwelling catheter (suprapubic tube)-resolved as of 01/31/2024 01-11-2024 through 01-17-2024. urine cx grew proteus, e. Coli and enterococcus.  Pt treated until 01-17-2024 with IV abx. Resolved.  Sepsis with acute renal failure without septic shock (HCC)-resolved as of 01/31/2024 01-11-2024 present on admission. WBC 16.9, Scr 4.0. UA showed signs of infection  01-11-2024 through 01-17-2024. urine cx grew proteus, e. Coli and enterococcus.  Pt treated until 01-17-2024 with IV abx.  Lacunar infarct, acute (HCC)-resolved as of 01/31/2024 01-12-2024 MRI brain, MRA head that was ordered on admission showed acute lacunar infarcts. On top of his  baseline old strokes of bilateral cerebellar, bilateral pontine, right midbrain, bilateral thalamic.  He has old infarct in the right basal ganglia with encephalomalacia. Neurology was consulted.  01-14-2024. He underwent further workup by neurology including carotid dopplers and echo. Final recs from neurology were: aspirin  81 mg daily and Brilinta  (ticagrelor ) 90 mg bid for 4 weeks(end date February 11, 2024) and then aspirin  alone. Neurology signs off  Normal anion gap metabolic acidosis-resolved as of 01/31/2024 01-11-2024 to 01-17-2024 pt given IVF. Acute metabolic acidosis resolved with IVF hydration.  Uremia due to inadequate renal perfusion-resolved as of 01/31/2024 01-11-2024 to 01-17-2024. admitted for ARF/uremia with a BUN of 214, Scr 4.0. pt given IVF over 5 days. By 9-10-20254 Acute hypernatremia, uremia  and ARF resolved. By 9-10-205 Na of 143, BUN of 16, Scr 1.08.   Acute hypernatremia-resolved as of 01/31/2024 01-11-2024 to 01-17-2024. admitted for ARF/uremia with a BUN of 214, Scr 4.0. pt given IVF over 5 days. By 9-10-20254 Acute hypernatremia, uremia  and ARF resolved. By 9-10-205 Na of 143, BUN of 16, Scr 1.08.   Malnutrition of moderate degree Nutrition Status: Nutrition Problem: Moderate Malnutrition Etiology: chronic illness (dementia) Signs/Symptoms: mild fat depletion, mild muscle depletion Interventions: Nepro shake    Pressure injury of skin Present on admission. Wound 01/11/24 1900 Pressure Injury Buttocks Left Stage 3 -  Full thickness tissue loss. Subcutaneous fat may be visible but bone, tendon or muscle are NOT exposed. (Active)    Hx of AKA (above knee amputation), right chronic  Depression 01/31/24 paxil  and remeron  are ordered but pt refusing to take meds.  02/01/24 stable. intermittently takes his meds    02/02/24 stable. Took most of his AM meds yesterday  02/03/24 stable  Dementia without behavioral disturbance (HCC) 01/31/24 paxil  and remeron   are ordered but pt refusing to take meds.  02/01/24 stable. intermittently takes his meds   02/02/24 stable. Took most of his AM meds yesterday  02/03/24 stable.  Chronic suprapubic catheter (HCC) Chronic. Unknown when it was initially placed, who placed it.  02/01/24 stable.  02/02/24 stable  02/03/24 stable. Yellow urine in catheter bag  PAD (peripheral artery disease) 01/31/24 stable. ASA ordered.   02/01/24 stable.  02/02/24 stable.  02/03/24 stable   Type 2 diabetes mellitus with vascular disease (HCC) 01/31/24 on SSI. Intermittently allow nursing staff to give him insulin  injections. CBG are mostly controlled.  02/01/24 intermittently takes his meds  CBG controlled for the most part  02/02/24 CBG stable  02/03/24 CBG stable.   Essential hypertension 01/31/24 norvasc  and coreg  ordered. Pt refusing to take some meds.  02/01/24 intermittently takes his meds. BP elevated due to pt inconsistently taking HTN meds  02/02/24 stable. Took most of his AM meds yesterday  02/03/24 stable  UGI bleed-resolved as of 01/31/2024 01-17-2024 through 01-20-2024  GI consulted due to melena and anemia. Pt transfused 1 unit PRBC. GI felt pt was as poor candidate for endoscopy procedure. Brilinta  held. By 01-20-2024, GI bleeding had stopped. Pt's niece wanted Brilinta  to be restarted(01-20-2024)  Unwitnessed fall-resolved as of 01/31/2024 01-11-2024 reportedly fell at [facility]. Unable to confirm at time of admission that this really happened.  Acute metabolic encephalopathy-resolved as of 01/31/2024 01-11-2024 until 01-17-2024 due to UTI, Sepsis, CVA, hypernatremia. Resolved.  Sepsis secondary to UTI due to chronic indwelling catheter-resolved as of 01/31/2024 01-11-2024 present on admission. WBC 16.9, Scr 4.0. UA showed signs of infection   01-11-2024 through 01-17-2024. urine cx grew proteus, e. Coli and enterococcus.  Pt treated until 01-17-2024 with IV abx.  Elevated troponin-resolved  as of 01/31/2024 01-12-2024 was felt not to be due to NSTEMI or demand ischemia but rather due to profound renal failure, dehydration. Resolved.   DVT prophylaxis:  ASA and Brilinta     Code Status: Limited: Do not attempt resuscitation (DNR) -DNR-LIMITED -Do Not Intubate/DNI  Family Communication: no family at bedside Disposition Plan: SNF Reason for continuing need for hospitalization: medically stable for DC.  Objective: Vitals:   02/02/24 0511 02/02/24 0810 02/02/24 2209 02/03/24 0739  BP: 124/78 121/76 (!) 144/80 (!) 146/72  Pulse: 74 75 73 66  Resp:  18 17   Temp: 98.8 F (37.1 C) 98.3 F (36.8 C) 97.9 F (36.6 C)   TempSrc:  Oral Oral   SpO2: 100% 100% 100% 100%  Weight:      Height:        Intake/Output Summary (Last 24 hours) at 02/03/2024 1328 Last data filed at 02/02/2024 1500 Gross per 24 hour  Intake --  Output 1450 ml  Net -1450 ml   Filed Weights   01/11/24 1411  Weight: 89.8 kg    Examination:  Physical Exam Vitals and nursing note reviewed.  Constitutional:      Comments: Awake, eyes open. Non verbal  HENT:     Head: Normocephalic and atraumatic.  Cardiovascular:     Rate and Rhythm: Normal rate and regular rhythm.  Pulmonary:     Effort: Pulmonary effort is normal.     Breath sounds: Normal breath sounds.  Abdominal:     General: Bowel sounds are normal. There is no distension.     Palpations: Abdomen is soft.  Musculoskeletal:     Comments: Right AKA Left foot in Prevelon boot  Skin:    General: Skin is warm and dry.     Data Reviewed: I have personally reviewed following labs and imaging studies  CBC: Recent Labs  Lab 01/29/24 0450  WBC 6.3  HGB 9.2*  HCT 28.0*  MCV 93.3  PLT 364   Basic Metabolic Panel: Recent Labs  Lab 01/29/24 0450  NA 141  K 3.7  CL 106  CO2 25  GLUCOSE 108*  BUN 10  CREATININE 1.27*  CALCIUM  8.8*   GFR: Estimated Creatinine Clearance: 65.7 mL/min (A) (by C-G formula based on SCr of 1.27  mg/dL (H)). CBG: Recent Labs  Lab 02/01/24 2056 02/02/24 0957 02/02/24 1215 02/02/24 1809 02/02/24 2204  GLUCAP 119* 98 145* 99 89   Scheduled Meds:  amLODipine   5 mg Oral Daily   aspirin  EC  81 mg Oral Daily   atorvastatin   80 mg Oral QHS   carvedilol   12.5 mg Oral BID WC   dutasteride   0.5 mg Oral Daily   feeding supplement (  NEPRO CARB STEADY)  237 mL Oral TID BM   insulin  aspart  0-15 Units Subcutaneous TID WC   insulin  aspart  0-5 Units Subcutaneous QHS   leptospermum manuka honey  1 Application Topical Daily   mirtazapine   15 mg Oral QHS   mouth rinse  15 mL Mouth Rinse 4 times per day   pantoprazole   40 mg Oral BID   PARoxetine   10 mg Oral Daily   sucralfate   1 g Oral TID WC & HS   tamsulosin   0.8 mg Oral Daily   ticagrelor   90 mg Oral BID   Continuous Infusions:   LOS: 23 days   Time spent: 55 minutes  Camellia Door, DO  [facility]  02/03/2024, 1:28 PM

## 2024-02-03 NOTE — Progress Notes (Signed)
 Patient refused po meds, insulin  and personal care

## 2024-02-04 DIAGNOSIS — F0390 Unspecified dementia without behavioral disturbance: Secondary | ICD-10-CM | POA: Diagnosis not present

## 2024-02-04 DIAGNOSIS — Z9359 Other cystostomy status: Secondary | ICD-10-CM | POA: Diagnosis not present

## 2024-02-04 DIAGNOSIS — Z7189 Other specified counseling: Secondary | ICD-10-CM | POA: Diagnosis not present

## 2024-02-04 DIAGNOSIS — F32A Depression, unspecified: Secondary | ICD-10-CM | POA: Diagnosis not present

## 2024-02-04 LAB — GLUCOSE, CAPILLARY
Glucose-Capillary: 105 mg/dL — ABNORMAL HIGH (ref 70–99)
Glucose-Capillary: 120 mg/dL — ABNORMAL HIGH (ref 70–99)
Glucose-Capillary: 149 mg/dL — ABNORMAL HIGH (ref 70–99)

## 2024-02-04 NOTE — Plan of Care (Signed)
 Problem: Education: Goal: Ability to describe self-care measures that may prevent or decrease complications (Diabetes Survival Skills Education) will improve Outcome: Progressing   Problem: Coping: Goal: Ability to adjust to condition or change in health will improve Outcome: Progressing   Problem: Fluid Volume: Goal: Ability to maintain a balanced intake and output will improve Outcome: Progressing   Problem: Health Behavior/Discharge Planning: Goal: Ability to identify and utilize available resources and services will improve Outcome: Progressing Goal: Ability to manage health-related needs will improve Outcome: Progressing   Problem: Metabolic: Goal: Ability to maintain appropriate glucose levels will improve Outcome: Progressing   Problem: Nutritional: Goal: Maintenance of adequate nutrition will improve Outcome: Progressing Goal: Progress toward achieving an optimal weight will improve Outcome: Progressing   Problem: Skin Integrity: Goal: Risk for impaired skin integrity will decrease Outcome: Progressing   Problem: Tissue Perfusion: Goal: Adequacy of tissue perfusion will improve Outcome: Progressing   Problem: Education: Goal: Knowledge of General Education information will improve Description: Including pain rating scale, medication(s)/side effects and non-pharmacologic comfort measures Outcome: Progressing   Problem: Health Behavior/Discharge Planning: Goal: Ability to manage health-related needs will improve Outcome: Progressing   Problem: Clinical Measurements: Goal: Ability to maintain clinical measurements within normal limits will improve Outcome: Progressing Goal: Will remain free from infection Outcome: Progressing Goal: Diagnostic test results will improve Outcome: Progressing Goal: Respiratory complications will improve Outcome: Progressing Goal: Cardiovascular complication will be avoided Outcome: Progressing   Problem: Activity: Goal:  Risk for activity intolerance will decrease Outcome: Progressing   Problem: Nutrition: Goal: Adequate nutrition will be maintained Outcome: Progressing   Problem: Coping: Goal: Level of anxiety will decrease Outcome: Progressing   Problem: Elimination: Goal: Will not experience complications related to bowel motility Outcome: Progressing Goal: Will not experience complications related to urinary retention Outcome: Progressing   Problem: Pain Managment: Goal: General experience of comfort will improve and/or be controlled Outcome: Progressing   Problem: Safety: Goal: Ability to remain free from injury will improve Outcome: Progressing   Problem: Skin Integrity: Goal: Risk for impaired skin integrity will decrease Outcome: Progressing   Problem: Education: Goal: Knowledge of disease or condition will improve Outcome: Progressing Goal: Knowledge of secondary prevention will improve (MUST DOCUMENT ALL) Outcome: Progressing Goal: Knowledge of patient specific risk factors will improve (DELETE if not current risk factor) Outcome: Progressing   Problem: Ischemic Stroke/TIA Tissue Perfusion: Goal: Complications of ischemic stroke/TIA will be minimized Outcome: Progressing   Problem: Coping: Goal: Will verbalize positive feelings about self Outcome: Progressing Goal: Will identify appropriate support needs Outcome: Progressing   Problem: Health Behavior/Discharge Planning: Goal: Ability to manage health-related needs will improve Outcome: Progressing Goal: Goals will be collaboratively established with patient/family Outcome: Progressing   Problem: Self-Care: Goal: Ability to participate in self-care as condition permits will improve Outcome: Progressing Goal: Verbalization of feelings and concerns over difficulty with self-care will improve Outcome: Progressing Goal: Ability to communicate needs accurately will improve Outcome: Progressing   Problem:  Nutrition: Goal: Risk of aspiration will decrease Outcome: Progressing Goal: Dietary intake will improve Outcome: Progressing   Problem: Education: Goal: Knowledge of disease or condition will improve Outcome: Progressing Goal: Knowledge of secondary prevention will improve (MUST DOCUMENT ALL) Outcome: Progressing Goal: Knowledge of patient specific risk factors will improve (DELETE if not current risk factor) Outcome: Progressing   Problem: Ischemic Stroke/TIA Tissue Perfusion: Goal: Complications of ischemic stroke/TIA will be minimized Outcome: Progressing   Problem: Coping: Goal: Will verbalize positive feelings about self Outcome: Progressing Goal: Will  identify appropriate support needs Outcome: Progressing   Problem: Health Behavior/Discharge Planning: Goal: Ability to manage health-related needs will improve Outcome: Progressing Goal: Goals will be collaboratively established with patient/family Outcome: Progressing   Problem: Self-Care: Goal: Ability to participate in self-care as condition permits will improve Outcome: Progressing Goal: Verbalization of feelings and concerns over difficulty with self-care will improve Outcome: Progressing Goal: Ability to communicate needs accurately will improve Outcome: Progressing

## 2024-02-04 NOTE — Progress Notes (Signed)
 PROGRESS NOTE    Ryan Hughes  FMW:[id number] DOB: [birth year] DOA: 01/11/2024 PCP: [name], MD  Subjective: Pt seen and examined. Eyes are open. Ate about 1/3 of his eggs. 2 bites of his English muffin. Appears to have drink about 8-10 ounces of liquid in cup. Pt is non-verbal. Onnie about 1/2 half of his meds yesterday.   Hospital Course: CC: confusion. Per EMS, Pt, from [facility], presents d/t increasing AMS and sweating x4 days.  Pt noted to be hypotensive.  Pt has a suprapubic catheter. Family reports Pt normally gets around well in a wheelchair and is talkative.   HPI: 58 yo AAM with PMH of dementia, depression, CVA, HLD, type II DM, PVD with right AKA, wheelchair-bound, BPH with suprapubic catheter present to the hospital with complaints of altered mental status.  History was obtained from family was at bedside.  Patient is unable to provide history secondary to confusion and unable to reach staff at the [facility].  Per EMS reportedly patient had confusion progressively worsening for last 4 days.  Per niece 2 days ago patient stopped taking his medications.  As the patient progressively decline [facility] staff sent the patient to hospital for further evaluation. Patient at his baseline is in wheelchair, normally carries out a conversation with clear speech but confused thoughts due to his dementia.  Patient pushes his wheelchair himself per niece. Patient has history of slipping out of his bed when family is visiting him.  And reportedly had a fall 2 weeks ago. No recent change in medications reported by the family. Per RN his suprapubic catheter was with significant sediments.  Significant Events: Admitted 01/11/2024 for acute hypernatremia, AKI 01-11-2024 until 9-10-20254 Acute hypernatremia, uremia  and ARF resolved with IVF. Na of 143, BUN of 16, Scr 1.08. pt UTI due to indwelling suprapubic tube treated with IV unasyn . Stopped on  01-17-2024 01-12-2024 to 01-14-2024  MRI brain shows acute lacunar strokes. Neurology consulted. Cva work up proceeds. Echo normal. Carotid dopplers minimal stenosis. Final recs are:aspirin  81 mg daily and Brilinta  (ticagrelor ) 90 mg bid for 4 weeks and then aspirin  alone  01-17-2024 GI consulted due to melena and anemia. Pt transfused 1 unit PRBC. GI felt pt was as poor candidate for endoscopy procedure. 01-18-2024 palliative care consulted for GOC Week of 01-17-2024 through 01-23-2024 GI bleeding stops after Brilinta  held.  Pt's niece [name] wanted Brilinta  to be restarted(01-20-2024) Week of 01-24-2024 through 01-30-2024. Pt refusing to take meds as well as po food/liquids. He is combatative with nursing staff who are providing basic care including changing bed linens and cleaning around his suprapubic tube  Admission Labs: Na 162, K 4.5, CO2 of 19, BUN 214, Scr 4.0 T prot 6.5, alb 3.2, AST 19, ALT 22, alk phos 74, t. Bili 0.5 WBC 16.9, HgB 9.5, plt 301 Covid/rsv/flu negative VBG pH 7.29, PCO2 of 44 HIV non-reactive TSH 0.892, FT4 1.08 NH3 of 17  Admission Imaging Studies: CXR Hypoinflation. No acute findings.  CT head No acute intracranial abnormality. 2. Stable atrophy and chronic small vessel ischemic changes. Remote lacunar infarcts in the bilateral basal ganglia Renal U/S Increased bilateral renal cortical echogenicity, compatible with medical renal disease. 2. Trace right perinephric fluid.  No hydronephrosis   Significant Labs: 01-11-2024 Vit b12 of 1115, folate > 20, vit B1 of 136 01-13-2024 A1c of 6.1%  Significant Imaging Studies: 01-12-2024 MRI Brain There are multiple old lacunar infarcts including bilateral cerebellar, bilateral pontine, right midbrain, bilateral  thalamic. There is an old infarct in the basal ganglia on the right with encephalomalacia. With the exception of the basal ganglia infarct, all of these have developed since the prior MRI study from 2022. There are several  acute lacunar infarcts involving the left frontal lobe and left posterior temporal/parietal lobe, and a single acute lacunar infarct in the right frontal lobe 01-12-2024 MRA head No large vessel occlusion or proximal hemodynamically significant stenosis 01-15-2024 abd xr Mildly dilated loops of jejunum in the left mid abdomen. This could be due to a focal ileus or mild partial small bowel obstructio 01-15-2024 CT abd/pelvis No acute findings or explanation for the patient's symptoms. No evidence of bowel obstruction. 2. New small bilateral pleural effusions with adjacent dependent airspace opacities, likely atelectasis. 3. Distal esophageal wall thickening, similar to previous study, suggesting esophagitis or chronic reflux. Correlate clinically. 4. Suprapubic bladder catheter in place with possible mild bladder wall thickening. 5. Aortic Atherosclerosis (ICD10-I70.0). Interval postsurgical changes of the right groin consistent with femoral bypass procedure.  Antibiotic Therapy: Anti-infectives (From admission, onward)    Start     Dose/Rate Route Frequency Ordered Stop   01/13/24 1515  Ampicillin -Sulbactam (UNASYN ) 3 g in sodium chloride  0.9 % 100 mL IVPB        3 g 200 mL/hr over 30 Minutes Intravenous Every 6 hours 01/13/24 1430 01/17/24 2209   01/12/24 1200  piperacillin -tazobactam (ZOSYN ) IVPB 3.375 g  Status:  Discontinued        3.375 g 12.5 mL/hr over 240 Minutes Intravenous Every 8 hours 01/12/24 0737 01/13/24 1102   01/12/24 1000  linezolid  (ZYVOX ) IVPB 600 mg  Status:  Discontinued        600 mg 300 mL/hr over 60 Minutes Intravenous Every 12 hours 01/11/24 1659 01/12/24 1448   01/12/24 0600  piperacillin -tazobactam (ZOSYN ) IVPB 2.25 g  Status:  Discontinued        2.25 g 100 mL/hr over 30 Minutes Intravenous Every 8 hours 01/11/24 1746 01/12/24 0737   01/11/24 2200  linezolid  (ZYVOX ) IVPB 600 mg  Status:  Discontinued        600 mg 300 mL/hr over 60 Minutes Intravenous Every 12 hours  01/11/24 1659 01/11/24 1659   01/11/24 1415  ceFEPIme  (MAXIPIME ) 2 g in sodium chloride  0.9 % 100 mL IVPB        2 g 200 mL/hr over 30 Minutes Intravenous  Once 01/11/24 1402 01/11/24 1532   01/11/24 1415  metroNIDAZOLE  (FLAGYL ) IVPB 500 mg        500 mg 100 mL/hr over 60 Minutes Intravenous  Once 01/11/24 1402 01/11/24 1647   01/11/24 1415  vancomycin  (VANCOCIN ) IVPB 1000 mg/200 mL premix  Status:  Discontinued        1,000 mg 200 mL/hr over 60 Minutes Intravenous  Once 01/11/24 1402 01/11/24 1406   01/11/24 1415  vancomycin  (VANCOREADY) IVPB 1500 mg/300 mL        1,500 mg 150 mL/hr over 120 Minutes Intravenous  Once 01/11/24 1407 01/11/24 1854       Procedures:   Consultants: Palliative care Neurology GI    Assessment and Plan: * Acute renal failure (ARF)-resolved as of 01/31/2024 01-11-2024 to 01-17-2024. admitted for ARF/uremia with a BUN of 214, Scr 4.0. pt given IVF over 5 days. By 9-10-20254 Acute hypernatremia, uremia  and ARF resolved. By 9-10-205 Na of 143, BUN of 16, Scr 1.08.   Goals of care, counseling/discussion 01-31-2024 today, palliative care successful in getting pt's niece to  agree to changing pt's code status to DNR/DNI. CM/TOC attempting SNF placement.  02/02/24 still awaiting SNF placement.  02/03/24 awaiting SNF placement  02/04/24 awaiting SNF placement.   UTI (urinary tract infection) due to urinary indwelling catheter (suprapubic tube)-resolved as of 01/31/2024 01-11-2024 through 01-17-2024. urine cx grew proteus, e. Coli and enterococcus.  Pt treated until 01-17-2024 with IV abx. Resolved.  Sepsis with acute renal failure without septic shock (HCC)-resolved as of 01/31/2024 01-11-2024 present on admission. WBC 16.9, Scr 4.0. UA showed signs of infection  01-11-2024 through 01-17-2024. urine cx grew proteus, e. Coli and enterococcus.  Pt treated until 01-17-2024 with IV abx.  Lacunar infarct, acute (HCC)-resolved as of 01/31/2024 01-12-2024 MRI brain, MRA  head that was ordered on admission showed acute lacunar infarcts. On top of his baseline old strokes of bilateral cerebellar, bilateral pontine, right midbrain, bilateral thalamic.  He has old infarct in the right basal ganglia with encephalomalacia. Neurology was consulted.  01-14-2024. He underwent further workup by neurology including carotid dopplers and echo. Final recs from neurology were: aspirin  81 mg daily and Brilinta  (ticagrelor ) 90 mg bid for 4 weeks(end date February 11, 2024) and then aspirin  alone. Neurology signs off  Normal anion gap metabolic acidosis-resolved as of 01/31/2024 01-11-2024 to 01-17-2024 pt given IVF. Acute metabolic acidosis resolved with IVF hydration.  Uremia due to inadequate renal perfusion-resolved as of 01/31/2024 01-11-2024 to 01-17-2024. admitted for ARF/uremia with a BUN of 214, Scr 4.0. pt given IVF over 5 days. By 9-10-20254 Acute hypernatremia, uremia  and ARF resolved. By 9-10-205 Na of 143, BUN of 16, Scr 1.08.   Acute hypernatremia-resolved as of 01/31/2024 01-11-2024 to 01-17-2024. admitted for ARF/uremia with a BUN of 214, Scr 4.0. pt given IVF over 5 days. By 9-10-20254 Acute hypernatremia, uremia  and ARF resolved. By 9-10-205 Na of 143, BUN of 16, Scr 1.08.   Malnutrition of moderate degree Nutrition Status: Nutrition Problem: Moderate Malnutrition Etiology: chronic illness (dementia) Signs/Symptoms: mild fat depletion, mild muscle depletion Interventions: Nepro shake    Pressure injury of skin Present on admission. Wound 01/11/24 1900 Pressure Injury Buttocks Left Stage 3 -  Full thickness tissue loss. Subcutaneous fat may be visible but bone, tendon or muscle are NOT exposed. (Active)    Hx of AKA (above knee amputation), right chronic  Depression 01/31/24 paxil  and remeron  are ordered but pt refusing to take meds.  02/01/24 stable. intermittently takes his meds    02/02/24 stable. Took most of his AM meds yesterday  02/03/24  stable  02/04/24 stable  Dementia without behavioral disturbance (HCC) 01/31/24 paxil  and remeron  are ordered but pt refusing to take meds.  02/01/24 stable. intermittently takes his meds   02/02/24 stable. Took most of his AM meds yesterday  02/03/24 stable.  02/04/24 stable  Chronic suprapubic catheter (HCC) Chronic. Unknown when it was initially placed, who placed it.  02/01/24 stable.  02/02/24 stable  02/03/24 stable. Yellow urine in catheter bag  02/04/24 stable. Yellow urine in bag  PAD (peripheral artery disease) 01/31/24 stable. ASA ordered.   02/01/24 stable.  02/02/24 stable.  02/03/24 stable  02/04/24 stable  Type 2 diabetes mellitus with vascular disease (HCC) 01/31/24 on SSI. Intermittently allow nursing staff to give him insulin  injections. CBG are mostly controlled.  02/01/24 intermittently takes his meds  CBG controlled for the most part  02/02/24 CBG stable  02/03/24 CBG stable.   02/04/24 stable   Essential hypertension 01/31/24 norvasc  and coreg  ordered. Pt refusing to take  some meds.  02/01/24 intermittently takes his meds. BP elevated due to pt inconsistently taking HTN meds  02/02/24 stable. Took most of his AM meds yesterday  02/03/24 stable  02/04/24 took about 1/2 of his meds yesterday. BP stable     UGI bleed-resolved as of 01/31/2024 01-17-2024 through 01-20-2024  GI consulted due to melena and anemia. Pt transfused 1 unit PRBC. GI felt pt was as poor candidate for endoscopy procedure. Brilinta  held. By 01-20-2024, GI bleeding had stopped. Pt's niece wanted Brilinta  to be restarted(01-20-2024)  Unwitnessed fall-resolved as of 01/31/2024 01-11-2024 reportedly fell at [facility]. Unable to confirm at time of admission that this really happened.  Acute metabolic encephalopathy-resolved as of 01/31/2024 01-11-2024 until 01-17-2024 due to UTI, Sepsis, CVA, hypernatremia. Resolved.  Sepsis secondary to UTI due to chronic indwelling  catheter-resolved as of 01/31/2024 01-11-2024 present on admission. WBC 16.9, Scr 4.0. UA showed signs of infection   01-11-2024 through 01-17-2024. urine cx grew proteus, e. Coli and enterococcus.  Pt treated until 01-17-2024 with IV abx.  Elevated troponin-resolved as of 01/31/2024 01-12-2024 was felt not to be due to NSTEMI or demand ischemia but rather due to profound renal failure, dehydration. Resolved.   DVT prophylaxis:   ASA/Brilinta    Code Status: Limited: Do not attempt resuscitation (DNR) -DNR-LIMITED -Do Not Intubate/DNI  Family Communication: no family at bedside Disposition Plan: SNF Reason for continuing need for hospitalization: medically stable for DC  Objective: Vitals:   02/02/24 2209 02/03/24 0739 02/04/24 0625 02/04/24 0755  BP:  (!) 146/72 130/74 (!) 159/82  Pulse:  66 64 73  Resp: 17  17 19   Temp: 97.9 F (36.6 C)  97.8 F (36.6 C) 98.8 F (37.1 C)  TempSrc: Oral  Axillary Oral  SpO2:  100% 100% 100%  Weight:      Height:        Intake/Output Summary (Last 24 hours) at 02/04/2024 1135 Last data filed at 02/04/2024 1024 Gross per 24 hour  Intake 120 ml  Output 1100 ml  Net -980 ml   Filed Weights   01/11/24 1411  Weight: 89.8 kg    Examination:  Physical Exam Vitals and nursing note reviewed.  Constitutional:      Comments: Chronically ill appearing. Eyes are open. Non-verbal  HENT:     Head: Normocephalic and atraumatic.  Cardiovascular:     Rate and Rhythm: Normal rate and regular rhythm.  Pulmonary:     Effort: Pulmonary effort is normal.  Abdominal:     General: Abdomen is flat.  Genitourinary:    Comments: +suprapubic catheter Yellow urine in catheter bag Musculoskeletal:     Comments: Right AKA  Left foot/lower leg in prevelon boot  Skin:    General: Skin is warm.     Capillary Refill: Capillary refill takes less than 2 seconds.     Data Reviewed: I have personally reviewed following labs and imaging studies  CBC: Recent  Labs  Lab 01/29/24 0450  WBC 6.3  HGB 9.2*  HCT 28.0*  MCV 93.3  PLT 364   Basic Metabolic Panel: Recent Labs  Lab 01/29/24 0450  NA 141  K 3.7  CL 106  CO2 25  GLUCOSE 108*  BUN 10  CREATININE 1.27*  CALCIUM  8.8*   GFR: Estimated Creatinine Clearance: 65.7 mL/min (A) (by C-G formula based on SCr of 1.27 mg/dL (H)). CBG: Recent Labs  Lab 02/02/24 1215 02/02/24 1809 02/02/24 2204 02/03/24 2038 02/04/24 0751  GLUCAP 145* 99 89 126* 105*  Scheduled Meds:  amLODipine   5 mg Oral Daily   aspirin  EC  81 mg Oral Daily   atorvastatin   80 mg Oral QHS   carvedilol   12.5 mg Oral BID WC   dutasteride   0.5 mg Oral Daily   feeding supplement (NEPRO CARB STEADY)  237 mL Oral TID BM   insulin  aspart  0-15 Units Subcutaneous TID WC   insulin  aspart  0-5 Units Subcutaneous QHS   leptospermum manuka honey  1 Application Topical Daily   mirtazapine   15 mg Oral QHS   mouth rinse  15 mL Mouth Rinse 4 times per day   pantoprazole   40 mg Oral BID   PARoxetine   10 mg Oral Daily   sucralfate   1 g Oral TID WC & HS   tamsulosin   0.8 mg Oral Daily   ticagrelor   90 mg Oral BID   Continuous Infusions:   LOS: 24 days   Time spent: 55 minutes  Camellia Door, DO  [facility]  02/04/2024, 11:35 AM

## 2024-02-05 ENCOUNTER — Inpatient Hospital Stay (HOSPITAL_COMMUNITY)

## 2024-02-05 DIAGNOSIS — Z9359 Other cystostomy status: Secondary | ICD-10-CM | POA: Diagnosis not present

## 2024-02-05 DIAGNOSIS — F32A Depression, unspecified: Secondary | ICD-10-CM | POA: Diagnosis not present

## 2024-02-05 DIAGNOSIS — F0390 Unspecified dementia without behavioral disturbance: Secondary | ICD-10-CM | POA: Diagnosis not present

## 2024-02-05 DIAGNOSIS — Z7189 Other specified counseling: Secondary | ICD-10-CM | POA: Diagnosis not present

## 2024-02-05 LAB — GLUCOSE, CAPILLARY
Glucose-Capillary: 100 mg/dL — ABNORMAL HIGH (ref 70–99)
Glucose-Capillary: 101 mg/dL — ABNORMAL HIGH (ref 70–99)
Glucose-Capillary: 109 mg/dL — ABNORMAL HIGH (ref 70–99)
Glucose-Capillary: 115 mg/dL — ABNORMAL HIGH (ref 70–99)
Glucose-Capillary: 115 mg/dL — ABNORMAL HIGH (ref 70–99)
Glucose-Capillary: 132 mg/dL — ABNORMAL HIGH (ref 70–99)
Glucose-Capillary: 143 mg/dL — ABNORMAL HIGH (ref 70–99)

## 2024-02-05 MED ORDER — CLONIDINE HCL 0.2 MG/24HR TD PTWK
0.2000 mg | MEDICATED_PATCH | TRANSDERMAL | Status: DC
  Administered 2024-02-05: 0.2 mg via TRANSDERMAL
  Filled 2024-02-05: qty 1

## 2024-02-05 NOTE — Progress Notes (Signed)
 PROGRESS NOTE    Ryan Hughes  FMW:[id number] DOB: 27-Jul-1965 DOA: 01/11/2024 PCP: [name], MD  Subjective: Pt seen and examined. Pt would open his eyes to voice and then quickly close them after he saw who it was who called him. Refuses to speak. Non-verbal.   Hospital Course: CC: confusion. Per EMS, Pt, from [facility], presents d/t increasing AMS and sweating x4 days.  Pt noted to be hypotensive.  Pt has a suprapubic catheter. Family reports Pt normally gets around well in a wheelchair and is talkative.   HPI: 58 yo AAM with PMH of dementia, depression, CVA, HLD, type II DM, PVD with right AKA, wheelchair-bound, BPH with suprapubic catheter present to the hospital with complaints of altered mental status.  History was obtained from family was at bedside.  Patient is unable to provide history secondary to confusion and unable to reach staff at the [facility].  Per EMS reportedly patient had confusion progressively worsening for last 4 days.  Per niece 2 days ago patient stopped taking his medications.  As the patient progressively decline [facility] staff sent the patient to hospital for further evaluation. Patient at his baseline is in wheelchair, normally carries out a conversation with clear speech but confused thoughts due to his dementia.  Patient pushes his wheelchair himself per niece. Patient has history of slipping out of his bed when family is visiting him.  And reportedly had a fall 2 weeks ago. No recent change in medications reported by the family. Per RN his suprapubic catheter was with significant sediments.  Significant Events: Admitted 01/11/2024 for acute hypernatremia, AKI 01-11-2024 until 9-10-20254 Acute hypernatremia, uremia  and ARF resolved with IVF. Na of 143, BUN of 16, Scr 1.08. pt UTI due to indwelling suprapubic tube treated with IV unasyn . Stopped on 01-17-2024 01-12-2024 to 01-14-2024  MRI brain shows acute lacunar strokes.  Neurology consulted. Cva work up proceeds. Echo normal. Carotid dopplers minimal stenosis. Final recs are:aspirin  81 mg daily and Brilinta  (ticagrelor ) 90 mg bid for 4 weeks and then aspirin  alone  01-17-2024 GI consulted due to melena and anemia. Pt transfused 1 unit PRBC. GI felt pt was as poor candidate for endoscopy procedure. 01-18-2024 palliative care consulted for GOC Week of 01-17-2024 through 01-23-2024 GI bleeding stops after Brilinta  held.  Pt's niece [name] wanted Brilinta  to be restarted(01-20-2024) Week of 01-24-2024 through 01-30-2024. Pt refusing to take meds as well as po food/liquids. He is combatative with nursing staff who are providing basic care including changing bed linens and cleaning around his suprapubic tube  Admission Labs: Na 162, K 4.5, CO2 of 19, BUN 214, Scr 4.0 T prot 6.5, alb 3.2, AST 19, ALT 22, alk phos 74, t. Bili 0.5 WBC 16.9, HgB 9.5, plt 301 Covid/rsv/flu negative VBG pH 7.29, PCO2 of 44 HIV non-reactive TSH 0.892, FT4 1.08 NH3 of 17  Admission Imaging Studies: CXR Hypoinflation. No acute findings.  CT head No acute intracranial abnormality. 2. Stable atrophy and chronic small vessel ischemic changes. Remote lacunar infarcts in the bilateral basal ganglia Renal U/S Increased bilateral renal cortical echogenicity, compatible with medical renal disease. 2. Trace right perinephric fluid.  No hydronephrosis   Significant Labs: 01-11-2024 Vit b12 of 1115, folate > 20, vit B1 of 136 01-13-2024 A1c of 6.1%  Significant Imaging Studies: 01-12-2024 MRI Brain There are multiple old lacunar infarcts including bilateral cerebellar, bilateral pontine, right midbrain, bilateral thalamic. There is an old infarct in the basal ganglia on the  right with encephalomalacia. With the exception of the basal ganglia infarct, all of these have developed since the prior MRI study from 2022. There are several acute lacunar infarcts involving the left frontal lobe and left posterior  temporal/parietal lobe, and a single acute lacunar infarct in the right frontal lobe 01-12-2024 MRA head No large vessel occlusion or proximal hemodynamically significant stenosis 01-15-2024 abd xr Mildly dilated loops of jejunum in the left mid abdomen. This could be due to a focal ileus or mild partial small bowel obstructio 01-15-2024 CT abd/pelvis No acute findings or explanation for the patient's symptoms. No evidence of bowel obstruction. 2. New small bilateral pleural effusions with adjacent dependent airspace opacities, likely atelectasis. 3. Distal esophageal wall thickening, similar to previous study, suggesting esophagitis or chronic reflux. Correlate clinically. 4. Suprapubic bladder catheter in place with possible mild bladder wall thickening. 5. Aortic Atherosclerosis (ICD10-I70.0). Interval postsurgical changes of the right groin consistent with femoral bypass procedure.  Antibiotic Therapy: Anti-infectives (From admission, onward)    Start     Dose/Rate Route Frequency Ordered Stop   01/13/24 1515  Ampicillin -Sulbactam (UNASYN ) 3 g in sodium chloride  0.9 % 100 mL IVPB        3 g 200 mL/hr over 30 Minutes Intravenous Every 6 hours 01/13/24 1430 01/17/24 2209   01/12/24 1200  piperacillin -tazobactam (ZOSYN ) IVPB 3.375 g  Status:  Discontinued        3.375 g 12.5 mL/hr over 240 Minutes Intravenous Every 8 hours 01/12/24 0737 01/13/24 1102   01/12/24 1000  linezolid  (ZYVOX ) IVPB 600 mg  Status:  Discontinued        600 mg 300 mL/hr over 60 Minutes Intravenous Every 12 hours 01/11/24 1659 01/12/24 1448   01/12/24 0600  piperacillin -tazobactam (ZOSYN ) IVPB 2.25 g  Status:  Discontinued        2.25 g 100 mL/hr over 30 Minutes Intravenous Every 8 hours 01/11/24 1746 01/12/24 0737   01/11/24 2200  linezolid  (ZYVOX ) IVPB 600 mg  Status:  Discontinued        600 mg 300 mL/hr over 60 Minutes Intravenous Every 12 hours 01/11/24 1659 01/11/24 1659   01/11/24 1415  ceFEPIme  (MAXIPIME ) 2 g in  sodium chloride  0.9 % 100 mL IVPB        2 g 200 mL/hr over 30 Minutes Intravenous  Once 01/11/24 1402 01/11/24 1532   01/11/24 1415  metroNIDAZOLE  (FLAGYL ) IVPB 500 mg        500 mg 100 mL/hr over 60 Minutes Intravenous  Once 01/11/24 1402 01/11/24 1647   01/11/24 1415  vancomycin  (VANCOCIN ) IVPB 1000 mg/200 mL premix  Status:  Discontinued        1,000 mg 200 mL/hr over 60 Minutes Intravenous  Once 01/11/24 1402 01/11/24 1406   01/11/24 1415  vancomycin  (VANCOREADY) IVPB 1500 mg/300 mL        1,500 mg 150 mL/hr over 120 Minutes Intravenous  Once 01/11/24 1407 01/11/24 1854       Procedures:   Consultants: Palliative care Neurology GI    Assessment and Plan: * Acute renal failure (ARF)-resolved as of 01/31/2024 01-11-2024 to 01-17-2024. admitted for ARF/uremia with a BUN of 214, Scr 4.0. pt given IVF over 5 days. By 9-10-20254 Acute hypernatremia, uremia  and ARF resolved. By 9-10-205 Na of 143, BUN of 16, Scr 1.08.   Goals of care, counseling/discussion 01-31-2024 today, palliative care successful in getting pt's niece to agree to changing pt's code status to DNR/DNI. CM/TOC attempting SNF placement.  02/02/24 still awaiting SNF placement.  02/03/24 awaiting SNF placement  02/04/24 awaiting SNF placement.  02/05/24 awaiting SNF placement   UTI (urinary tract infection) due to urinary indwelling catheter (suprapubic tube)-resolved as of 01/31/2024 01-11-2024 through 01-17-2024. urine cx grew proteus, e. Coli and enterococcus.  Pt treated until 01-17-2024 with IV abx. Resolved.  Sepsis with acute renal failure without septic shock (HCC)-resolved as of 01/31/2024 01-11-2024 present on admission. WBC 16.9, Scr 4.0. UA showed signs of infection  01-11-2024 through 01-17-2024. urine cx grew proteus, e. Coli and enterococcus.  Pt treated until 01-17-2024 with IV abx.  Lacunar infarct, acute (HCC)-resolved as of 01/31/2024 01-12-2024 MRI brain, MRA head that was ordered on admission  showed acute lacunar infarcts. On top of his baseline old strokes of bilateral cerebellar, bilateral pontine, right midbrain, bilateral thalamic.  He has old infarct in the right basal ganglia with encephalomalacia. Neurology was consulted.  01-14-2024. He underwent further workup by neurology including carotid dopplers and echo. Final recs from neurology were: aspirin  81 mg daily and Brilinta  (ticagrelor ) 90 mg bid for 4 weeks(end date February 11, 2024) and then aspirin  alone. Neurology signs off  Normal anion gap metabolic acidosis-resolved as of 01/31/2024 01-11-2024 to 01-17-2024 pt given IVF. Acute metabolic acidosis resolved with IVF hydration.  Uremia due to inadequate renal perfusion-resolved as of 01/31/2024 01-11-2024 to 01-17-2024. admitted for ARF/uremia with a BUN of 214, Scr 4.0. pt given IVF over 5 days. By 9-10-20254 Acute hypernatremia, uremia  and ARF resolved. By 9-10-205 Na of 143, BUN of 16, Scr 1.08.   Acute hypernatremia-resolved as of 01/31/2024 01-11-2024 to 01-17-2024. admitted for ARF/uremia with a BUN of 214, Scr 4.0. pt given IVF over 5 days. By 9-10-20254 Acute hypernatremia, uremia  and ARF resolved. By 9-10-205 Na of 143, BUN of 16, Scr 1.08.   Malnutrition of moderate degree Nutrition Status: Nutrition Problem: Moderate Malnutrition Etiology: chronic illness (dementia) Signs/Symptoms: mild fat depletion, mild muscle depletion Interventions: Nepro shake    Pressure injury of skin Present on admission. Wound 01/11/24 1900 Pressure Injury Buttocks Left Stage 3 -  Full thickness tissue loss. Subcutaneous fat may be visible but bone, tendon or muscle are NOT exposed. (Active)   01-17-2024   02-05-2024     Hx of AKA (above knee amputation), right chronic  Depression 01/31/24 paxil  and remeron  are ordered but pt refusing to take meds.  02/01/24 stable. intermittently takes his meds    02/02/24 stable. Took most of his AM meds yesterday  02/03/24  stable  02/04/24 stable  02/05/24 stable  Dementia without behavioral disturbance (HCC) 01/31/24 paxil  and remeron  are ordered but pt refusing to take meds.  02/01/24 stable. intermittently takes his meds   02/02/24 stable. Took most of his AM meds yesterday  02/03/24 stable.  02/04/24 stable  02/05/24 stable. Still refusing meds  Chronic suprapubic catheter (HCC) Chronic. Unknown when it was initially placed, who placed it.  02/01/24 stable.  02/02/24 stable  02/03/24 stable. Yellow urine in catheter bag  02/04/24 stable. Yellow urine in bag  02/05/24 stable  PAD (peripheral artery disease) 01/31/24 stable. ASA ordered.   02/01/24 stable.  02/02/24 stable.  02/03/24 stable  02/04/24 stable  02/05/24 stable  Type 2 diabetes mellitus with vascular disease (HCC) 01/31/24 on SSI. Intermittently allow nursing staff to give him insulin  injections. CBG are mostly controlled.  02/01/24 intermittently takes his meds  CBG controlled for the most part  02/02/24 CBG stable  02/03/24 CBG stable.   02/04/24  stable  02/05/24 stable   Essential hypertension 01/31/24 norvasc  and coreg  ordered. Pt refusing to take some meds.  02/01/24 intermittently takes his meds. BP elevated due to pt inconsistently taking HTN meds  02/02/24 stable. Took most of his AM meds yesterday  02/03/24 stable  02/04/24 took about 1/2 of his meds yesterday. BP stable  02/05/24 BP elevated yesterday due to continued non-compliance of his HTN meds. Pt refusing to take about 1/2 of his meds. Will add clonidine  patch 0.2 mg so that he will ask least get something for HTN control all the time.   UGI bleed-resolved as of 01/31/2024 01-17-2024 through 01-20-2024  GI consulted due to melena and anemia. Pt transfused 1 unit PRBC. GI felt pt was as poor candidate for endoscopy procedure. Brilinta  held. By 01-20-2024, GI bleeding had stopped. Pt's niece wanted Brilinta  to be  restarted(01-20-2024)  Unwitnessed fall-resolved as of 01/31/2024 01-11-2024 reportedly fell at [facility]. Unable to confirm at time of admission that this really happened.  Acute metabolic encephalopathy-resolved as of 01/31/2024 01-11-2024 until 01-17-2024 due to UTI, Sepsis, CVA, hypernatremia. Resolved.  Sepsis secondary to UTI due to chronic indwelling catheter-resolved as of 01/31/2024 01-11-2024 present on admission. WBC 16.9, Scr 4.0. UA showed signs of infection   01-11-2024 through 01-17-2024. urine cx grew proteus, e. Coli and enterococcus.  Pt treated until 01-17-2024 with IV abx.  Elevated troponin-resolved as of 01/31/2024 01-12-2024 was felt not to be due to NSTEMI or demand ischemia but rather due to profound renal failure, dehydration. Resolved.  DVT prophylaxis:   ASA/brilinta    Code Status: Limited: Do not attempt resuscitation (DNR) -DNR-LIMITED -Do Not Intubate/DNI  Family Communication: no family at bedside. Have not seen any family for 6 days Disposition Plan: SNF Reason for continuing need for hospitalization: medically stable  Objective: Vitals:   02/05/24 0200 02/05/24 0235 02/05/24 0648 02/05/24 0835  BP:  131/75 121/78 111/83  Pulse:  70 78 70  Resp:  (!) 22 13 18   Temp: 98 F (36.7 C)  98.2 F (36.8 C) 98 F (36.7 C)  TempSrc: Oral  Oral   SpO2:  100% 100% 100%  Weight:      Height:        Intake/Output Summary (Last 24 hours) at 02/05/2024 1252 Last data filed at 02/05/2024 1000 Gross per 24 hour  Intake 180 ml  Output 350 ml  Net -170 ml   Filed Weights   01/11/24 1411  Weight: 89.8 kg    Examination:  Physical Exam Vitals and nursing note reviewed.  Constitutional:      Comments: Chronically ill appearing. Non-verbal. Will briefly open his eyes to his name being called but then voluntarily closes his eyes and will not open them again.  HENT:     Head: Normocephalic.  Cardiovascular:     Rate and Rhythm: Normal rate and regular rhythm.   Pulmonary:     Effort: Pulmonary effort is normal.  Abdominal:     General: Bowel sounds are normal.     Palpations: Abdomen is soft.  Genitourinary:    Comments: +suprapubic tube with yellow urine in catheter bag Musculoskeletal:     Comments: Right AKA Left lower leg/foot in Prevolon boot.  Skin:    General: Skin is warm.     Capillary Refill: Capillary refill takes less than 2 seconds.  Neurological:     Mental Status: He is alert.     Comments: Non-verbal.     Data Reviewed: I have personally reviewed  following labs and imaging studies  CBG: Recent Labs  Lab 02/05/24 0010 02/05/24 0652 02/05/24 0834 02/05/24 1146 02/05/24 1219  GLUCAP 109* 132* 115* 115* 100*   Radiology Studies: DG Chest Port 1 View Result Date: 02/05/2024 CLINICAL DATA:  Shortness of breath EXAM: PORTABLE CHEST 1 VIEW COMPARISON:  01/13/2024 FINDINGS: Heart and mediastinal contours are within normal limits. No focal opacities or effusions. No acute bony abnormality. IMPRESSION: No active disease. Electronically Signed   By: Franky Crease M.D.   On: 02/05/2024 00:33    Scheduled Meds:  amLODipine   5 mg Oral Daily   aspirin  EC  81 mg Oral Daily   atorvastatin   80 mg Oral QHS   carvedilol   12.5 mg Oral BID WC   cloNIDine   0.2 mg Transdermal Weekly   dutasteride   0.5 mg Oral Daily   feeding supplement (NEPRO CARB STEADY)  237 mL Oral TID BM   insulin  aspart  0-15 Units Subcutaneous TID WC   insulin  aspart  0-5 Units Subcutaneous QHS   leptospermum manuka honey  1 Application Topical Daily   mirtazapine   15 mg Oral QHS   mouth rinse  15 mL Mouth Rinse 4 times per day   pantoprazole   40 mg Oral BID   PARoxetine   10 mg Oral Daily   sucralfate   1 g Oral TID WC & HS   tamsulosin   0.8 mg Oral Daily   ticagrelor   90 mg Oral BID   Continuous Infusions:   LOS: 25 days   Time spent: 55 minutes  Camellia Door, DO  [facility]  02/05/2024, 12:52 PM

## 2024-02-05 NOTE — Progress Notes (Signed)
   02/05/24 0000  Assess: MEWS Score  Temp 98.5 F (36.9 C)  BP (!) 141/70  MAP (mmHg) 90  Pulse Rate 81  Resp (!) 26  SpO2 100 %  O2 Device Room Air  Assess: MEWS Score  MEWS Temp 0  MEWS Systolic 0  MEWS Pulse 0  MEWS RR 2  MEWS LOC 0  MEWS Score 2  MEWS Score Color Yellow  Assess: if the MEWS score is Yellow or Red  Were vital signs accurate and taken at a resting state? Yes  Does the patient meet 2 or more of the SIRS criteria? No  MEWS guidelines implemented  Yes, yellow  Treat  MEWS Interventions Considered administering scheduled or prn medications/treatments as ordered  Take Vital Signs  Increase Vital Sign Frequency  Yellow: Q2hr x1, continue Q4hrs until patient remains green for 12hrs  Escalate  MEWS: Escalate Yellow: Discuss with charge nurse and consider notifying provider and/or RRT  Notify: Charge Nurse/RN  Name of Charge Nurse/RN Notified Dauda  Provider Notification  Provider Name/Title Franky  Date Provider Notified 02/05/24  Time Provider Notified 0006  Method of Notification Page  Notification Reason Change in status  Provider response See new orders (Chest aray)  Date of Provider Response 02/05/24  Time of Provider Response 0020  Assess: SIRS CRITERIA  SIRS Temperature  0  SIRS Respirations  1  SIRS Pulse 0  SIRS WBC 0  SIRS Score Sum  1

## 2024-02-05 NOTE — Progress Notes (Addendum)
 Palliative:  Palliative following peripherally. Continue plan and recommendations for return to long term care with the addition of hospice per my last conversation with [facility]. Palliative is available as needed.   No charge  Bernarda Kitty, NP Palliative Medicine Team Pager [phone] (Please see [email or web address] for schedule) Team Phone [phone]

## 2024-02-05 NOTE — Consult Note (Signed)
 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   02/05/24    Patient will remain on WOC follow-up list for weekly.   Sherrilyn Hals MSN RN CWOCN WOC [facility]  [phone] (Available from 7-3 pm Mon-Friday)

## 2024-02-06 DIAGNOSIS — Z66 Do not resuscitate: Secondary | ICD-10-CM | POA: Insufficient documentation

## 2024-02-06 DIAGNOSIS — I6381 Other cerebral infarction due to occlusion or stenosis of small artery: Secondary | ICD-10-CM | POA: Diagnosis not present

## 2024-02-06 DIAGNOSIS — E872 Acidosis, unspecified: Secondary | ICD-10-CM

## 2024-02-06 DIAGNOSIS — T83510A Infection and inflammatory reaction due to cystostomy catheter, initial encounter: Secondary | ICD-10-CM

## 2024-02-06 DIAGNOSIS — E870 Hyperosmolality and hypernatremia: Secondary | ICD-10-CM | POA: Diagnosis not present

## 2024-02-06 DIAGNOSIS — N179 Acute kidney failure, unspecified: Secondary | ICD-10-CM | POA: Diagnosis not present

## 2024-02-06 DIAGNOSIS — R6520 Severe sepsis without septic shock: Secondary | ICD-10-CM

## 2024-02-06 LAB — GLUCOSE, CAPILLARY
Glucose-Capillary: 110 mg/dL — ABNORMAL HIGH (ref 70–99)
Glucose-Capillary: 118 mg/dL — ABNORMAL HIGH (ref 70–99)

## 2024-02-06 MED ORDER — SUCRALFATE 1 GM/10ML PO SUSP: 1.0000 g | Freq: Three times a day (TID) | ORAL | Status: AC

## 2024-02-06 MED ORDER — TICAGRELOR 90 MG PO TABS: 90.0000 mg | ORAL_TABLET | Freq: Two times a day (BID) | ORAL | Status: AC

## 2024-02-06 MED ORDER — DUTASTERIDE 0.5 MG PO CAPS: 0.5000 mg | ORAL_CAPSULE | Freq: Every day | ORAL | Status: AC

## 2024-02-06 MED ORDER — MEDIHONEY WOUND/BURN DRESSING EX PSTE: 1.0000 | PASTE | Freq: Every day | CUTANEOUS | Status: AC

## 2024-02-06 MED ORDER — ASPIRIN 81 MG PO TBEC: 81.0000 mg | DELAYED_RELEASE_TABLET | Freq: Every day | ORAL | Status: AC

## 2024-02-06 MED ORDER — PAROXETINE HCL 10 MG PO TABS: 10.0000 mg | ORAL_TABLET | Freq: Every day | ORAL | Status: AC

## 2024-02-06 MED ORDER — INSULIN ASPART 100 UNIT/ML IJ SOLN: 0.0000 [IU] | Freq: Three times a day (TID) | INTRAMUSCULAR | Status: AC

## 2024-02-06 MED ORDER — ACETAMINOPHEN 325 MG PO TABS: 650.0000 mg | ORAL_TABLET | Freq: Four times a day (QID) | ORAL | Status: AC | PRN

## 2024-02-06 MED ORDER — CLONIDINE 0.2 MG/24HR TD PTWK: 0.2000 mg | MEDICATED_PATCH | TRANSDERMAL | Status: AC

## 2024-02-06 MED ORDER — PANTOPRAZOLE SODIUM 40 MG PO TBEC: 40.0000 mg | DELAYED_RELEASE_TABLET | Freq: Two times a day (BID) | ORAL | Status: AC

## 2024-02-06 MED ORDER — MIRTAZAPINE 15 MG PO TBDP: 15.0000 mg | ORAL_TABLET | Freq: Every day | ORAL | Status: AC

## 2024-02-06 NOTE — Discharge Summary (Addendum)
 [facility] Phys[facility] Discharge Summary   Patient name: Ryan Hughes  Admit date:     01/11/2024  Discharge date: 02/06/2024  Attending Physician: PATEL, PRANAV M [[id number]]  Discharge Physician: Camellia Door   PCP: [name], MD  Admitted From: [facility]. LTC.   Disposition:  [facility] LTC/ SNF  Recommendations for Outpatient Follow-up:  Follow up with PCP in 1-2 weeks Will need outpatient urology consult for suprapubic tube change out every 2 weeks  Home Health:No Equipment/Devices: None  Discharge Condition:Stable CODE STATUS:DNR/DNI Diet recommendation: Regular Fluid Restriction: None  Hospital Summary: CC: confusion. Per EMS, Pt, from [facility], presents d/t increasing AMS and sweating x4 days.  Pt noted to be hypotensive.  Pt has a suprapubic catheter. Family reports Pt normally gets around well in a wheelchair and is talkative.   HPI: 58 yo AAM with PMH of dementia, depression, CVA, HLD, type II DM, PVD with right AKA, wheelchair-bound, BPH with suprapubic catheter present to the hospital with complaints of altered mental status.  History was obtained from family was at bedside.  Patient is unable to provide history secondary to confusion and unable to reach staff at the [facility].  Per EMS reportedly patient had confusion progressively worsening for last 4 days.  Per niece 2 days ago patient stopped taking his medications.  As the patient progressively decline [facility] staff sent the patient to hospital for further evaluation. Patient at his baseline is in wheelchair, normally carries out a conversation with clear speech but confused thoughts due to his dementia.  Patient pushes his wheelchair himself per niece. Patient has history of slipping out of his bed when family is visiting him.  And reportedly had a fall 2 weeks ago. No recent change in medications reported by the family. Per RN his suprapubic catheter was with  significant sediments.  Significant Events: Admitted 01/11/2024 for acute hypernatremia, AKI 01-11-2024 until 9-10-20254 Acute hypernatremia, uremia  and ARF resolved with IVF. Na of 143, BUN of 16, Scr 1.08. pt UTI due to indwelling suprapubic tube treated with IV unasyn . Stopped on 01-17-2024 01-12-2024 to 01-14-2024  MRI brain shows acute lacunar strokes. Neurology consulted. Cva work up proceeds. Echo normal. Carotid dopplers minimal stenosis. Final recs are:aspirin  81 mg daily and Brilinta  (ticagrelor ) 90 mg bid for 4 weeks and then aspirin  alone  01-17-2024 GI consulted due to melena and anemia. Pt transfused 1 unit PRBC. GI felt pt was as poor candidate for endoscopy procedure. 01-18-2024 palliative care consulted for GOC Week of 01-17-2024 through 01-23-2024 GI bleeding stops after Brilinta  held.  Pt's niece [name] wanted Brilinta  to be restarted(01-20-2024) Week of 01-24-2024 through 01-30-2024. Pt refusing to take meds as well as po food/liquids. He is combatative with nursing staff who are providing basic care including changing bed linens and cleaning around his suprapubic tube Week of 01-31-2024 through 02-06-2024. Pt intermittently takes his meds. Palliative care discussed with family. Pt made DNR/DNI. SW able to locate SNF for LTC  Admission Labs: Na 162, K 4.5, CO2 of 19, BUN 214, Scr 4.0 T prot 6.5, alb 3.2, AST 19, ALT 22, alk phos 74, t. Bili 0.5 WBC 16.9, HgB 9.5, plt 301 Covid/rsv/flu negative VBG pH 7.29, PCO2 of 44 HIV non-reactive TSH 0.892, FT4 1.08 NH3 of 17  Admission Imaging Studies: CXR Hypoinflation. No acute findings.  CT head No acute intracranial abnormality. 2. Stable atrophy and chronic small vessel ischemic changes. Remote lacunar infarcts in the bilateral basal ganglia  Renal U/S Increased bilateral renal cortical echogenicity, compatible with medical renal disease. 2. Trace right perinephric fluid.  No hydronephrosis   Significant Labs: 01-11-2024 Vit b12 of  1115, folate > 20, vit B1 of 136 01-13-2024 A1c of 6.1%  Significant Imaging Studies: 01-12-2024 MRI Brain There are multiple old lacunar infarcts including bilateral cerebellar, bilateral pontine, right midbrain, bilateral thalamic. There is an old infarct in the basal ganglia on the right with encephalomalacia. With the exception of the basal ganglia infarct, all of these have developed since the prior MRI study from 2022. There are several acute lacunar infarcts involving the left frontal lobe and left posterior temporal/parietal lobe, and a single acute lacunar infarct in the right frontal lobe 01-12-2024 MRA head No large vessel occlusion or proximal hemodynamically significant stenosis 01-15-2024 abd xr Mildly dilated loops of jejunum in the left mid abdomen. This could be due to a focal ileus or mild partial small bowel obstructio 01-15-2024 CT abd/pelvis No acute findings or explanation for the patient's symptoms. No evidence of bowel obstruction. 2. New small bilateral pleural effusions with adjacent dependent airspace opacities, likely atelectasis. 3. Distal esophageal wall thickening, similar to previous study, suggesting esophagitis or chronic reflux. Correlate clinically. 4. Suprapubic bladder catheter in place with possible mild bladder wall thickening. 5. Aortic Atherosclerosis (ICD10-I70.0). Interval postsurgical changes of the right groin consistent with femoral bypass procedure.  Antibiotic Therapy: Anti-infectives (From admission, onward)    Start     Dose/Rate Route Frequency Ordered Stop   01/13/24 1515  Ampicillin -Sulbactam (UNASYN ) 3 g in sodium chloride  0.9 % 100 mL IVPB        3 g 200 mL/hr over 30 Minutes Intravenous Every 6 hours 01/13/24 1430 01/17/24 2209   01/12/24 1200  piperacillin -tazobactam (ZOSYN ) IVPB 3.375 g  Status:  Discontinued        3.375 g 12.5 mL/hr over 240 Minutes Intravenous Every 8 hours 01/12/24 0737 01/13/24 1102   01/12/24 1000  linezolid  (ZYVOX ) IVPB  600 mg  Status:  Discontinued        600 mg 300 mL/hr over 60 Minutes Intravenous Every 12 hours 01/11/24 1659 01/12/24 1448   01/12/24 0600  piperacillin -tazobactam (ZOSYN ) IVPB 2.25 g  Status:  Discontinued        2.25 g 100 mL/hr over 30 Minutes Intravenous Every 8 hours 01/11/24 1746 01/12/24 0737   01/11/24 2200  linezolid  (ZYVOX ) IVPB 600 mg  Status:  Discontinued        600 mg 300 mL/hr over 60 Minutes Intravenous Every 12 hours 01/11/24 1659 01/11/24 1659   01/11/24 1415  ceFEPIme  (MAXIPIME ) 2 g in sodium chloride  0.9 % 100 mL IVPB        2 g 200 mL/hr over 30 Minutes Intravenous  Once 01/11/24 1402 01/11/24 1532   01/11/24 1415  metroNIDAZOLE  (FLAGYL ) IVPB 500 mg        500 mg 100 mL/hr over 60 Minutes Intravenous  Once 01/11/24 1402 01/11/24 1647   01/11/24 1415  vancomycin  (VANCOCIN ) IVPB 1000 mg/200 mL premix  Status:  Discontinued        1,000 mg 200 mL/hr over 60 Minutes Intravenous  Once 01/11/24 1402 01/11/24 1406   01/11/24 1415  vancomycin  (VANCOREADY) IVPB 1500 mg/300 mL        1,500 mg 150 mL/hr over 120 Minutes Intravenous  Once 01/11/24 1407 01/11/24 1854       Procedures: Blood Transfusion Record     Product Unit Status Volume Start End  Transfuse RBC    W2399  25  [id number]  [id number] Completed 01/17/24 1016 679 mL 01/17/24 0616 01/17/24 1016    W2399  25  [id number]  [id number] Completed 01/16/24 0324 315 mL 01/15/24 2324 01/16/24 0213   Consultants: Palliative care Neurology GI   Hospital Course by Problem: * Acute renal failure (ARF)-resolved as of 01/31/2024 01-11-2024 to 01-17-2024. admitted for ARF/uremia with a BUN of 214, Scr 4.0. pt given IVF over 5 days. By 9-10-20254 Acute hypernatremia, uremia  and ARF resolved. By 9-10-205 Na of 143, BUN of 16, Scr 1.08.   Goals of care, counseling/discussion 01-31-2024 today, palliative care successful in getting pt's niece to agree to changing pt's code status to DNR/DNI. CM/TOC attempting SNF  placement.  02/02/24 still awaiting SNF placement.  02/03/24 awaiting SNF placement  02/04/24 awaiting SNF placement.  02/05/24 awaiting SNF placement  02/06/24 SW has secured LTC with [facility]. Stable for DC today.   UTI (urinary tract infection) due to urinary indwelling catheter (suprapubic tube)-resolved as of 01/31/2024 01-11-2024 through 01-17-2024. urine cx grew proteus, e. Coli and enterococcus.  Pt treated until 01-17-2024 with IV abx. Resolved.  Sepsis with acute renal failure without septic shock (HCC)-resolved as of 01/31/2024 01-11-2024 present on admission. WBC 16.9, Scr 4.0. UA showed signs of infection  01-11-2024 through 01-17-2024. urine cx grew proteus, e. Coli and enterococcus.  Pt treated until 01-17-2024 with IV abx.  Lacunar infarct, acute (HCC)-resolved as of 01/31/2024 01-12-2024 MRI brain, MRA head that was ordered on admission showed acute lacunar infarcts. On top of his baseline old strokes of bilateral cerebellar, bilateral pontine, right midbrain, bilateral thalamic.  He has old infarct in the right basal ganglia with encephalomalacia. Neurology was consulted.  01-14-2024. He underwent further workup by neurology including carotid dopplers and echo. Final recs from neurology were: aspirin  81 mg daily and Brilinta  (ticagrelor ) 90 mg bid for 4 weeks(end date February 11, 2024) and then aspirin  alone. Neurology signs off  Normal anion gap metabolic acidosis-resolved as of 01/31/2024 01-11-2024 to 01-17-2024 pt given IVF. Acute metabolic acidosis resolved with IVF hydration.  Uremia due to inadequate renal perfusion-resolved as of 01/31/2024 01-11-2024 to 01-17-2024. admitted for ARF/uremia with a BUN of 214, Scr 4.0. pt given IVF over 5 days. By 9-10-20254 Acute hypernatremia, uremia  and ARF resolved. By 9-10-205 Na of 143, BUN of 16, Scr 1.08.   Acute hypernatremia-resolved as of 01/31/2024 01-11-2024 to 01-17-2024. admitted for  ARF/uremia with a BUN of 214, Scr 4.0. pt given IVF over 5 days. By 9-10-20254 Acute hypernatremia, uremia  and ARF resolved. By 9-10-205 Na of 143, BUN of 16, Scr 1.08.   Malnutrition of moderate degree Nutrition Status: Nutrition Problem: Moderate Malnutrition Etiology: chronic illness (dementia) Signs/Symptoms: mild fat depletion, mild muscle depletion Interventions: Nepro shake    Pressure injury of skin Present on admission. Wound 01/11/24 1900 Pressure Injury Buttocks Left Stage 3 -  Full thickness tissue loss. Subcutaneous fat may be visible but bone, tendon or muscle are NOT exposed. (Active)   01-17-2024   02-05-2024     Hx of AKA (above knee amputation), right chronic  Depression 01/31/24 paxil  and remeron  are ordered but pt refusing to take meds.  02/01/24 stable. intermittently takes his meds    02/02/24 stable. Took most of his AM meds yesterday  02/03/24 stable  02/04/24 stable  02/05/24 stable  02/06/24 continue paxil  10 mg qam, remeron  15 mg at bedtime.   Dementia  without behavioral disturbance (HCC) 01/31/24 paxil  and remeron  are ordered but pt refusing to take meds.  02/01/24 stable. intermittently takes his meds   02/02/24 stable. Took most of his AM meds yesterday  02/03/24 stable.  02/04/24 stable  02/05/24 stable. Still refusing meds  02/06/24 continue paxil  10 mg qam, remeron  15 mg at bedtime.   Chronic suprapubic catheter (HCC) Chronic. Unknown when it was initially placed, who placed it.  02/01/24 stable.  02/02/24 stable  02/03/24 stable. Yellow urine in catheter bag  02/04/24 stable. Yellow urine in bag  02/05/24 stable  02/06/24 continue with chronic suprapubic tube. Change every 2 months. He can go to urology office to have it changed out.   PAD (peripheral artery disease) 01/31/24 stable. ASA ordered.   02/01/24 stable.  02/02/24 stable.  02/03/24 stable  02/04/24 stable  02/05/24 stable  02/06/24 continue  lipitor  80 mg daily, asa 81 mg daily, brilinta  90 mg bid.   Type 2 diabetes mellitus with vascular disease (HCC) 01/31/24 on SSI. Intermittently allow nursing staff to give him insulin  injections. CBG are mostly controlled.  02/01/24 intermittently takes his meds  CBG controlled for the most part  02/02/24 CBG stable  02/03/24 CBG stable.   02/04/24 stable  02/05/24 stable  02/06/24 continue SSI at SNF.   Essential hypertension 01/31/24 norvasc  and coreg  ordered. Pt refusing to take some meds.  02/01/24 intermittently takes his meds. BP elevated due to pt inconsistently taking HTN meds  02/02/24 stable. Took most of his AM meds yesterday  02/03/24 stable  02/04/24 took about 1/2 of his meds yesterday. BP stable  02/05/24 BP elevated yesterday due to continued non-compliance of his HTN meds. Pt refusing to take about 1/2 of his meds. Will add clonidine  patch 0.2 mg so that he will ask least get something for HTN control all the time.  02/06/24 continue with clonidine  patch since pt is non-compliant most of the time with oral meds.    DNR (do not resuscitate)/DNI(Do Not Intubate) 01-31-2024 palliative care discussed with pt's niece/HCPOA [name]. Pt was made DNR/DNI.     UGI bleed-resolved as of 01/31/2024 01-17-2024 through 01-20-2024  GI consulted due to melena and anemia. Pt transfused 1 unit PRBC. GI felt pt was as poor candidate for endoscopy procedure. Brilinta  held. By 01-20-2024, GI bleeding had stopped. Pt's niece wanted Brilinta  to be restarted(01-20-2024)  Unwitnessed fall-resolved as of 01/31/2024 01-11-2024 reportedly fell at [facility]. Unable to confirm at time of admission that this really happened.  Acute metabolic encephalopathy-resolved as of 01/31/2024 01-11-2024 until 01-17-2024 due to UTI, Sepsis, CVA, hypernatremia. Resolved.  Sepsis secondary to UTI due to chronic indwelling catheter-resolved as of 01/31/2024 01-11-2024 present on admission. WBC 16.9, Scr 4.0.  UA showed signs of infection   01-11-2024 through 01-17-2024. urine cx grew proteus, e. Coli and enterococcus.  Pt treated until 01-17-2024 with IV abx.  Elevated troponin-resolved as of 01/31/2024 01-12-2024 was felt not to be due to NSTEMI or demand ischemia but rather due to profound renal failure, dehydration. Resolved.    Discharge Diagnoses:  Active Problems:   Goals of care, counseling/discussion   Essential hypertension   Type 2 diabetes mellitus with vascular disease (HCC)   PAD (peripheral artery disease)   Chronic suprapubic catheter (HCC)   Dementia without behavioral disturbance (HCC)   Depression   Hx of AKA (above knee amputation), right   Pressure injury of skin   Malnutrition of moderate degree   DNR (do not resuscitate)/DNI(Do Not Intubate)  Discharge Instructions  Discharge Instructions     Ambulatory referral to Neurology   Complete by: As directed    An appointment is requested in approximately: 8 weeks   Call MD for:  difficulty breathing, headache or visual disturbances   Complete by: As directed    Call MD for:  extreme fatigue   Complete by: As directed    Call MD for:  hives   Complete by: As directed    Call MD for:  persistant dizziness or light-headedness   Complete by: As directed    Call MD for:  persistant nausea and vomiting   Complete by: As directed    Call MD for:  redness, tenderness, or signs of infection (pain, swelling, redness, odor or green/yellow discharge around incision site)   Complete by: As directed    Call MD for:  severe uncontrolled pain   Complete by: As directed    Call MD for:  temperature >100.4   Complete by: As directed    Diet general   Complete by: As directed    Meds may be given with water  or while with puree; upright as much as possible. Supervision   Discharge instructions   Complete by: As directed    1. Needs outpatient referral to urology for suprapubic tube change out every 2 months.   Discharge wound  care:   Complete by: As directed    Sacral/buttock wounds Wound care  Every shift      Comments: Cleanse wound with NS, pat dry.  Apply Apply 1/4 thick layer of leptospermum honey to wound bed,  top with silicone foam,  change daily. Ok to lift silicone foam to reapply Medihoney daily.   Increase activity slowly   Complete by: As directed       Allergies as of 02/06/2024       Reactions   Pork (diagnostic) Other (See Comments)   Triggers gout flare   Shellfish Allergy Other (See Comments)   Triggers gout flare        Medication List     STOP taking these medications    aspirin  325 MG tablet Replaced by: aspirin  EC 81 MG tablet   B COMPLEX PO   barrier cream Crea Commonly known as: non-specified   clopidogrel  75 MG tablet Commonly known as: PLAVIX    ferrous gluconate 324 MG tablet Commonly known as: FERGON   furosemide 20 MG tablet Commonly known as: LASIX   losartan  50 MG tablet Commonly known as: COZAAR    metFORMIN  1000 MG tablet Commonly known as: GLUCOPHAGE    senna-docusate 8.6-50 MG tablet Commonly known as: Senokot-S   sodium chloride  0.65 % Soln nasal spray Commonly known as: OCEAN   Trulicity  0.75 MG/0.5ML Soaj Generic drug: Dulaglutide    VASELINE EX       TAKE these medications    acetaminophen  325 MG tablet Commonly known as: TYLENOL  Take 2 tablets (650 mg total) by mouth every 6 (six) hours as needed for mild pain (pain score 1-3) or fever (or Fever >/= 101). What changed:  when to take this reasons to take this Another medication with the same name was removed. Continue taking this medication, and follow the directions you see here.   amLODipine  5 MG tablet Commonly known as: NORVASC  Take 5 mg by mouth daily.   aspirin  EC 81 MG tablet Take 1 tablet (81 mg total) by mouth daily. Swallow whole. Start taking on: February 07, 2024 Replaces: aspirin  325 MG tablet   atorvastatin  80  MG tablet Commonly known as: LIPITOR  Take 1  tablet (80 mg total) by mouth at bedtime.   carvedilol  12.5 MG tablet Commonly known as: COREG  Take 1 tablet (12.5 mg total) by mouth 2 (two) times daily with a meal.   cloNIDine  0.2 mg/24hr patch Commonly known as: CATAPRES  - Dosed in mg/24 hr Place 1 patch (0.2 mg total) onto the skin once a week. Start taking on: February 12, 2024   dutasteride  0.5 MG capsule Commonly known as: AVODART  Take 1 capsule (0.5 mg total) by mouth daily. Start taking on: February 07, 2024   insulin  aspart 100 UNIT/ML injection Commonly known as: novoLOG  Inject 0-15 Units into the skin 3 (three) times daily with meals. Do NOT hold if patient is NPO. Moderate Scale. What changed: additional instructions   leptospermum manuka honey Pste paste Apply 1 Application topically daily. Interchangeable with TheraHoney Apply thin layer (3 mm) to wound.   mirtazapine  15 MG disintegrating tablet Commonly known as: REMERON  SOL-TAB Take 1 tablet (15 mg total) by mouth at bedtime.   pantoprazole  40 MG tablet Commonly known as: PROTONIX  Take 1 tablet (40 mg total) by mouth 2 (two) times daily.   PARoxetine  10 MG tablet Commonly known as: PAXIL  Take 1 tablet (10 mg total) by mouth daily. Start taking on: February 07, 2024 What changed: when to take this   sucralfate  1 GM/10ML suspension Commonly known as: CARAFATE  Take 10 mLs (1 g total) by mouth 4 (four) times daily -  with meals and at bedtime.   tamsulosin  0.4 MG Caps capsule Commonly known as: FLOMAX  Take 2 capsules (0.8 mg total) by mouth daily.   ticagrelor  90 MG Tabs tablet Commonly known as: BRILINTA  Take 1 tablet (90 mg total) by mouth 2 (two) times daily.               Discharge Care Instructions  (From admission, onward)           Start     Ordered   02/06/24 0000  Discharge wound care:       Comments: Sacral/buttock wounds Wound care  Every shift      Comments: Cleanse wound with NS, pat dry.  Apply Apply 1/4 thick layer of  leptospermum honey to wound bed,  top with silicone foam,  change daily. Ok to lift silicone foam to reapply Medihoney daily.   02/06/24 1106            Follow-up Information     [facility]. Schedule an appointment as soon as possible for a visit in 2 month(s).   Specialty: Neurology Why: Please call make an appointment for 8 weeks after discharge Contact information: [address] [place]  [address] [phone]               Allergies  Allergen Reactions   Pork (Diagnostic) Other (See Comments)    Triggers gout flare   Shellfish Allergy Other (See Comments)    Triggers gout flare    Discharge Exam: Vitals:   02/06/24 0726 02/06/24 1107  BP: (!) 141/80 (!) 153/75  Pulse: 72 75  Resp: 16 18  Temp: 97.8 F (36.6 C) 98 F (36.7 C)  SpO2: 100% 97%    Physical Exam Vitals and nursing note reviewed.  Constitutional:      Comments: Awake this AM. Was verbal this AM. [place] he was doing okay when he was asked.  HENT:     Head: Normocephalic and atraumatic.  Eyes:     General: No scleral icterus. Cardiovascular:     Rate and Rhythm: Normal rate.  Pulmonary:     Effort: Pulmonary effort is normal.     Breath sounds: Normal breath sounds.  Musculoskeletal:     Comments: Right AKA Left foot in prevelon boot  Skin:    General: Skin is warm and dry.     Capillary Refill: Capillary refill takes less than 2 seconds.  Neurological:     Mental Status: He is disoriented.     Comments: Awake and oriented to place. Knew he was at [facility]. Incorrect [phone]). Did not know the month.     The results of significant diagnostics from this hospitalization (including imaging, microbiology, ancillary and laboratory) are listed below for reference.     Labs: CBG: Recent Labs  Lab 02/05/24 1146 02/05/24 1219 02/05/24 1654 02/05/24 2237 02/06/24 0807  GLUCAP 115* 100* 101* 143* 110*    Urinalysis    Component Value Date/Time   COLORURINE YELLOW 01/11/2024 1706   APPEARANCEUR HAZY (A) 01/11/2024 1706   LABSPEC 1.019 01/11/2024 1706   PHURINE 7.0 01/11/2024 1706   GLUCOSEU NEGATIVE 01/11/2024 1706   HGBUR MODERATE (A) 01/11/2024 1706   BILIRUBINUR NEGATIVE 01/11/2024 1706   BILIRUBINUR negative 12/31/2020 1548   KETONESUR NEGATIVE 01/11/2024 1706   PROTEINUR 30 (A) 01/11/2024 1706   UROBILINOGEN 0.2 12/31/2020 1548   UROBILINOGEN 0.2 07/04/2010 0959   NITRITE NEGATIVE 01/11/2024 1706   LEUKOCYTESUR MODERATE (A) 01/11/2024 1706   Component     Latest Ref Rng 10/02/2023 3:10 AM 01/11/2024 2:24 PM 01/11/2024 5:30 PM 01/11/2024 10:22 PM 01/12/2024 3:08 AM 01/12/2024 10:08 AM  Sodium     135 - 145 mmol/L 141  162 (HH)  162 (HH)  161 (HH)  166 (HH)  162 (HH)   Potassium     3.5 - 5.1 mmol/L 3.8  4.5  4.5  4.0  4.3  3.9   Chloride     98 - 111 mmol/L 109  130 (H)  127 (H)  130 (H)  >130 (HH)  >130 (HH)   CO2     22 - 32 mmol/L 24  19 (L)  18 (L)  17 (L)  14 (L)  18 (L)   Glucose     70 - 99 mg/dL 875 (H)  826 (H)  853 (H)  182 (H)  203 (H)  210 (H)   BUN     6 - 20 mg/dL 15  785 (H)  795 (H)  805 (H)  187 (H)  158 (H)   Creatinine     0.61 - 1.24 mg/dL 8.90  5.99 (H)  6.36 (H)  3.25 (H)  2.97 (H)  2.64 (H)   Calcium      8.9 - 10.3 mg/dL 8.9  9.2  8.9  8.6 (L)  8.9  9.0   Anion gap     5 - 15  8  13  17  (H)  14  NOT CALCULATED  NOT CALCULATED   GFR, Estimated     >60 mL/min >60  16 (L)  18 (L)  21 (L)  23 (L)  27 (L)    Component     Latest Ref Rng 01/12/2024 4:45 PM 01/12/2024 10:20 PM 01/13/2024 3:22 AM 01/13/2024 10:24 AM 01/13/2024 6:08 PM 01/13/2024 9:55 PM 01/14/2024 5:45 AM  Sodium     135 - 145 mmol/L 162 (HH)  157 (H)  158 (H)  153 (H)  152 (H)  150 (H)  151 (H)   Potassium     3.5 - 5.1 mmol/L 3.8  3.8  3.7  3.7  3.6  3.9  3.3 (L)   Chloride     98 - 111 mmol/L 126 (H)  129 (H)  129 (H)  124 (H)  121 (H)  123 (H)  119 (H)   CO2     22 - 32 mmol/L 17 (L)   19 (L)  18 (L)  19 (L)  17 (L)  16 (L)  21 (L)   Glucose     70 - 99 mg/dL 673 (H)  715 (H)  793 (H)  205 (H)  223 (H)  208 (H)  193 (H)   BUN     6 - 20 mg/dL 868 (H)  881 (H)  898 (H)  82 (H)  63 (H)  56 (H)  42 (H)   Creatinine     0.61 - 1.24 mg/dL 7.51 (H)  7.80 (H)  7.94 (H)  1.93 (H)  1.69 (H)  1.56 (H)  1.33 (H)   Calcium      8.9 - 10.3 mg/dL 8.5 (L)  8.6 (L)  8.5 (L)  8.5 (L)  8.4 (L)  8.2 (L)  8.2 (L)   Anion gap     5 - 15  19 (H)  9  11  10  14  11  11    GFR, Estimated     >60 mL/min 29 (L)  34 (L)  37 (L)  39 (L)  46 (L)  51 (L)  >60    Component     Latest Ref Rng 01/14/2024 9:55 AM 01/14/2024 6:02 PM 01/15/2024 9:57 AM 01/15/2024 8:16 PM 01/16/2024 6:52 AM 01/17/2024 6:47 PM 01/18/2024 3:38 AM  Sodium     135 - 145 mmol/L 149 (H)  148 (H)  147 (H)  145  144  143  143   Potassium     3.5 - 5.1 mmol/L 3.2 (L)  3.7  3.6  3.3 (L)  3.5  3.5  3.0 (L)   Chloride     98 - 111 mmol/L 118 (H)  117 (H)  118 (H)  116 (H)  113 (H)  112 (H)  113 (H)   CO2     22 - 32 mmol/L 21 (L)  21 (L)  20 (L)  19 (L)  21 (L)  21 (L)  22   Glucose     70 - 99 mg/dL 796 (H)  836 (H)  832 (H)  258 (H)  200 (H)  93  92   BUN     6 - 20 mg/dL 37 (H)  32 (H)  40 (H)  37 (H)  31 (H)  16  11   Creatinine     0.61 - 1.24 mg/dL 8.69 (H)  8.83  8.51 (H)  1.46 (H)  1.48 (H)  1.08  1.07   Calcium      8.9 - 10.3 mg/dL 7.9 (L)  8.0 (L)  7.7 (L)  7.7 (L)  7.8 (L)  7.6 (L)  7.8 (L)   Anion gap     5 - 15  10  10  9  10  10  10  8    GFR, Estimated     >60 mL/min >60  >60  54 (L)  55 (L)  54 (L)  >60  >60    Component     Latest Ref Rng  01/19/2024 8:43 AM 01/20/2024 3:26 AM 01/21/2024 2:30 AM 01/22/2024 3:44 AM 01/24/2024 3:55 AM 01/26/2024 3:55 AM  Sodium     135 - 145 mmol/L 141  139  141  141  139  142   Potassium     3.5 - 5.1 mmol/L 3.1 (L)  3.4 (L)  3.3 (L)  3.6  3.2 (L)  3.9   Chloride     98 - 111 mmol/L 111  110  110  110  111  105   CO2     22 - 32 mmol/L 20 (L)  21 (L)  21 (L)  22  23  20  (L)    Glucose     70 - 99 mg/dL 80  888 (H)  855 (H)  97  133 (H)  106 (H)   BUN     6 - 20 mg/dL 9  11  10  9  10  8    Creatinine     0.61 - 1.24 mg/dL 8.95  8.86  8.88  8.85  1.18  1.04   Calcium      8.9 - 10.3 mg/dL 7.9 (L)  7.8 (L)  7.9 (L)  8.0 (L)  8.0 (L)  8.6 (L)   Anion gap     5 - 15  10  8  10  9  5  17  (H)   GFR, Estimated     >60 mL/min >60  >60  >60  >60  >60  >60    Component     Latest Ref Rng 01/29/2024 4:50 AM  Sodium     135 - 145 mmol/L 141   Potassium     3.5 - 5.1 mmol/L 3.7   Chloride     98 - 111 mmol/L 106   CO2     22 - 32 mmol/L 25   Glucose     70 - 99 mg/dL 891 (H)   BUN     6 - 20 mg/dL 10   Creatinine     9.38 - 1.24 mg/dL 8.72 (H)   Calcium      8.9 - 10.3 mg/dL 8.8 (L)   Anion gap     5 - 15  10   GFR, Estimated     >60 mL/min >60     Legend: (H) High (L) Low (HH) High Panic (LL) Low Panic  Component     Latest Ref Rng 07/27/2022 8:35 AM 09/19/2022 9:23 AM 10/02/2023 3:10 AM 01/11/2024 2:24 PM 01/12/2024 3:08 AM  WBC     4.0 - 10.5 K/uL 5.4  6.7  5.7  16.9 (H)  19.7 (H)   RBC     4.22 - 5.81 MIL/uL 3.65 (L)  3.58 (L)  3.78 (L)  3.08 (L)  2.61 (L)   Hemoglobin     13.0 - 17.0 g/dL 88.9 (L)  89.4 (L)  88.5 (L)  9.5 (L)  8.1 (L)   HCT     39.0 - 52.0 % 34.8 (L)  32.8 (L)  37.8 (L)  31.1 (L)  26.8 (L)   MCV     80.0 - 100.0 fL 95.3  91.6  100.0  101.0 (H)  102.7 (H)   MCH     26.0 - 34.0 pg 30.1  29.3  30.2  30.8  31.0   MCHC     30.0 - 36.0 g/dL 68.3  67.9  69.7  69.4  30.2   RDW     11.5 - 15.5 %  14.4  14.6  14.4  15.6 (H)  15.5   Platelets     150 - 400 K/uL 274  302  287  301  219   nRBC     0.0 - 0.2 % 0.0  0.0  0.0  0.0  0.0   Neutrophils     %   48  80    NEUT#     1.7 - 7.7 K/uL   2.8  13.7 (H)    Lymphocytes     %   39  14    Lymphs Abs     0.7 - 4.0 K/uL   2.2  2.3    Monocytes Relative     %   10  4    Monocyte #     0.1 - 1.0 K/uL   0.5  0.7    Eosinophil     %   2  1    Eosinophils Absolute     0.0 - 0.5  K/uL   0.1  0.1    Basophil     %   1  0    Basophils Absolute     0.0 - 0.1 K/uL   0.0  0.0    Immature Granulocytes     %   0  1    Abs Immature Granulocytes     0.00 - 0.07 K/uL   0.00  0.11 (H)    pH, Ven     7.25 - 7.43        pCO2, Ven     44 - 60 mmHg       pO2, Ven     32 - 45 mmHg       Bicarbonate     20.0 - 28.0 mmol/L       TCO2     22 - 32 mmol/L       O2 Saturation     %       Acid-Base Excess     0.0 - 2.0 mmol/L       Sodium     135 - 145 mmol/L       Potassium     3.5 - 5.1 mmol/L       Calcium  Ionized     1.15 - 1.40 mmol/L       Sample type       Comment       Chloride     98 - 111 mmol/L       BUN     6 - 20 mg/dL       Creatinine     9.38 - 1.24 mg/dL       Glucose     70 - 99 mg/dL       Lymphs     Not Estab. %       Monocytes     Not Estab. %       Eos     Not Estab. %       Basos     Not Estab. %       Monocytes Absolute     0.1 - 0.9 x10E3/uL       EOS (ABSOLUTE)     0.0 - 0.4 x10E3/uL       Immature Grans (Abs)     0.0 - 0.1 x10E3/uL        Component     Latest Ref Rng 01/12/2024 4:45 PM 01/14/2024 5:45 AM 01/15/2024 9:57 AM  01/15/2024 8:16 PM 01/16/2024 6:52 AM  WBC     4.0 - 10.5 K/uL 14.3 (H)  14.3 (H)  21.8 (H)  20.3 (H)  17.5 (H)   RBC     4.22 - 5.81 MIL/uL 2.55 (L)  2.59 (L)  2.22 (L)  2.07 (L)  2.32 (L)   Hemoglobin     13.0 - 17.0 g/dL 8.0 (L)  8.0 (L)  7.0 (L)  6.5 (LL)  7.3 (L)   HCT     39.0 - 52.0 % 25.8 (L)  25.0 (L)  21.6 (L)  20.0 (L)  22.3 (L)   MCV     80.0 - 100.0 fL 101.2 (H)  96.5  97.3  96.6  96.1   MCH     26.0 - 34.0 pg 31.4  30.9  31.5  31.4  31.5   MCHC     30.0 - 36.0 g/dL 68.9  67.9  67.5  67.4  32.7   RDW     11.5 - 15.5 % 15.2  14.7  14.6  14.7  14.6   Platelets     150 - 400 K/uL 229  263  242  234  214   nRBC     0.0 - 0.2 % 0.2  0.0  0.1  0.2  0.2   Neutrophils     %    82    NEUT#     1.7 - 7.7 K/uL    16.7 (H)    Lymphocytes     %    12    Lymphs Abs     0.7 - 4.0 K/uL    2.4     Monocytes Relative     %    4    Monocyte #     0.1 - 1.0 K/uL    0.7    Eosinophil     %    1    Eosinophils Absolute     0.0 - 0.5 K/uL    0.2    Basophil     %    0    Basophils Absolute     0.0 - 0.1 K/uL    0.0    Immature Granulocytes     %    1    Abs Immature Granulocytes     0.00 - 0.07 K/uL    0.17 (H)    pH, Ven     7.25 - 7.43        pCO2, Ven     44 - 60 mmHg       pO2, Ven     32 - 45 mmHg       Bicarbonate     20.0 - 28.0 mmol/L       TCO2     22 - 32 mmol/L       O2 Saturation     %       Acid-Base Excess     0.0 - 2.0 mmol/L       Sodium     135 - 145 mmol/L       Potassium     3.5 - 5.1 mmol/L       Calcium  Ionized     1.15 - 1.40 mmol/L       Sample type       Comment       Chloride     98 - 111 mmol/L       BUN  6 - 20 mg/dL       Creatinine     9.38 - 1.24 mg/dL       Glucose     70 - 99 mg/dL       Lymphs     Not Estab. %       Monocytes     Not Estab. %       Eos     Not Estab. %       Basos     Not Estab. %       Monocytes Absolute     0.1 - 0.9 x10E3/uL       EOS (ABSOLUTE)     0.0 - 0.4 x10E3/uL       Immature Grans (Abs)     0.0 - 0.1 x10E3/uL        Component     Latest Ref Rng 01/17/2024 3:04 AM 01/17/2024 6:47 PM 01/18/2024 3:38 AM 01/19/2024 8:43 AM 01/20/2024 3:26 AM  WBC     4.0 - 10.5 K/uL 10.0   7.3  6.8  6.8   RBC     4.22 - 5.81 MIL/uL 2.16 (L)   2.50 (L)  2.67 (L)  2.53 (L)   Hemoglobin     13.0 - 17.0 g/dL 6.8 (LL)  8.0 (L)  7.9 (L)  8.4 (L)  7.9 (L)   HCT     39.0 - 52.0 % 20.9 (L)  24.2 (L)  23.3 (L)  25.3 (L)  23.5 (L)   MCV     80.0 - 100.0 fL 96.8   93.2  94.8  92.9   MCH     26.0 - 34.0 pg 31.5   31.6  31.5  31.2   MCHC     30.0 - 36.0 g/dL 67.4   66.0  66.7  66.3   RDW     11.5 - 15.5 % 14.6   15.0  14.9  14.8   Platelets     150 - 400 K/uL 212   248  281  274   nRBC     0.0 - 0.2 % 0.0   0.0  0.0  0.0   Neutrophils     %       NEUT#     1.7 - 7.7 K/uL       Lymphocytes     %        Lymphs Abs     0.7 - 4.0 K/uL       Monocytes Relative     %       Monocyte #     0.1 - 1.0 K/uL       Eosinophil     %       Eosinophils Absolute     0.0 - 0.5 K/uL       Basophil     %       Basophils Absolute     0.0 - 0.1 K/uL       Immature Granulocytes     %       Abs Immature Granulocytes     0.00 - 0.07 K/uL       pH, Ven     7.25 - 7.43        pCO2, Ven     44 - 60 mmHg       pO2, Ven     32 - 45 mmHg       Bicarbonate  20.0 - 28.0 mmol/L       TCO2     22 - 32 mmol/L       O2 Saturation     %       Acid-Base Excess     0.0 - 2.0 mmol/L       Sodium     135 - 145 mmol/L       Potassium     3.5 - 5.1 mmol/L       Calcium  Ionized     1.15 - 1.40 mmol/L       Sample type       Comment       Chloride     98 - 111 mmol/L       BUN     6 - 20 mg/dL       Creatinine     9.38 - 1.24 mg/dL       Glucose     70 - 99 mg/dL       Lymphs     Not Estab. %       Monocytes     Not Estab. %       Eos     Not Estab. %       Basos     Not Estab. %       Monocytes Absolute     0.1 - 0.9 x10E3/uL       EOS (ABSOLUTE)     0.0 - 0.4 x10E3/uL       Immature Grans (Abs)     0.0 - 0.1 x10E3/uL        Component     Latest Ref Rng 01/21/2024 2:30 AM 01/22/2024 3:44 AM 01/24/2024 3:55 AM 01/25/2024 3:47 AM 01/26/2024 3:55 AM  WBC     4.0 - 10.5 K/uL 5.7  5.3  7.0  7.4  8.3   RBC     4.22 - 5.81 MIL/uL 2.72 (L)  2.59 (L)  2.61 (L)  2.55 (L)  2.88 (L)   Hemoglobin     13.0 - 17.0 g/dL 8.5 (L)  8.1 (L)  8.2 (L)  7.9 (L)  8.9 (L)   HCT     39.0 - 52.0 % 25.4 (L)  24.6 (L)  24.6 (L)  23.9 (L)  27.2 (L)   MCV     80.0 - 100.0 fL 93.4  95.0  94.3  93.7  94.4   MCH     26.0 - 34.0 pg 31.3  31.3  31.4  31.0  30.9   MCHC     30.0 - 36.0 g/dL 66.4  67.0  66.6  66.8  32.7   RDW     11.5 - 15.5 % 14.9  14.9  14.5  14.5  14.3   Platelets     150 - 400 K/uL 299  297  312  298  332   nRBC     0.0 - 0.2 % 0.0  0.0  0.0  0.0  0.0   Neutrophils     %        NEUT#     1.7 - 7.7 K/uL       Lymphocytes     %       Lymphs Abs     0.7 - 4.0 K/uL       Monocytes Relative     %       Monocyte #     0.1 - 1.0 K/uL  Eosinophil     %       Eosinophils Absolute     0.0 - 0.5 K/uL       Basophil     %       Basophils Absolute     0.0 - 0.1 K/uL       Immature Granulocytes     %       Abs Immature Granulocytes     0.00 - 0.07 K/uL       pH, Ven     7.25 - 7.43        pCO2, Ven     44 - 60 mmHg       pO2, Ven     32 - 45 mmHg       Bicarbonate     20.0 - 28.0 mmol/L       TCO2     22 - 32 mmol/L       O2 Saturation     %       Acid-Base Excess     0.0 - 2.0 mmol/L       Sodium     135 - 145 mmol/L       Potassium     3.5 - 5.1 mmol/L       Calcium  Ionized     1.15 - 1.40 mmol/L       Sample type       Comment       Chloride     98 - 111 mmol/L       BUN     6 - 20 mg/dL       Creatinine     9.38 - 1.24 mg/dL       Glucose     70 - 99 mg/dL       Lymphs     Not Estab. %       Monocytes     Not Estab. %       Eos     Not Estab. %       Basos     Not Estab. %       Monocytes Absolute     0.1 - 0.9 x10E3/uL       EOS (ABSOLUTE)     0.0 - 0.4 x10E3/uL       Immature Grans (Abs)     0.0 - 0.1 x10E3/uL        Component     Latest Ref Rng 01/29/2024 4:50 AM  WBC     4.0 - 10.5 K/uL 6.3   RBC     4.22 - 5.81 MIL/uL 3.00 (L)   Hemoglobin     13.0 - 17.0 g/dL 9.2 (L)   HCT     60.9 - 52.0 % 28.0 (L)   MCV     80.0 - 100.0 fL 93.3   MCH     26.0 - 34.0 pg 30.7   MCHC     30.0 - 36.0 g/dL 67.0   RDW     88.4 - 84.4 % 14.3   Platelets     150 - 400 K/uL 364   nRBC     0.0 - 0.2 % 0.0   Neutrophils     %   NEUT#     1.7 - 7.7 K/uL   Lymphocytes     %   Lymphs Abs     0.7 - 4.0 K/uL   Monocytes Relative     %  Monocyte #     0.1 - 1.0 K/uL   Eosinophil     %   Eosinophils Absolute     0.0 - 0.5 K/uL   Basophil     %   Basophils Absolute     0.0 - 0.1 K/uL   Immature Granulocytes      %   Abs Immature Granulocytes     0.00 - 0.07 K/uL   pH, Ven     7.25 - 7.43    pCO2, Ven     44 - 60 mmHg   pO2, Ven     32 - 45 mmHg   Bicarbonate     20.0 - 28.0 mmol/L   TCO2     22 - 32 mmol/L   O2 Saturation     %   Acid-Base Excess     0.0 - 2.0 mmol/L   Sodium     135 - 145 mmol/L   Potassium     3.5 - 5.1 mmol/L   Calcium  Ionized     1.15 - 1.40 mmol/L   Sample type   Comment   Chloride     98 - 111 mmol/L   BUN     6 - 20 mg/dL   Creatinine     9.38 - 1.24 mg/dL   Glucose     70 - 99 mg/dL   Lymphs     Not Estab. %   Monocytes     Not Estab. %   Eos     Not Estab. %   Basos     Not Estab. %   Monocytes Absolute     0.1 - 0.9 x10E3/uL   EOS (ABSOLUTE)     0.0 - 0.4 x10E3/uL   Immature Grans (Abs)     0.0 - 0.1 x10E3/uL     Legend: (L) Low (H) High (HH) High Panic (LL) Low Panic   Procedures/Studies: DG Chest Port 1 View Result Date: 02/05/2024 CLINICAL DATA:  Shortness of breath EXAM: PORTABLE CHEST 1 VIEW COMPARISON:  01/13/2024 FINDINGS: Heart and mediastinal contours are within normal limits. No focal opacities or effusions. No acute bony abnormality. IMPRESSION: No active disease. Electronically Signed   By: Franky Crease M.D.   On: 02/05/2024 00:33   DG Swallowing Func-Speech Pathology Result Date: 01/29/2024 Table formatting from the original result was not included. Modified Barium Swallow Study Patient Details Name: Macky Galik MRN: [id number] Date of Birth: 04/01/1966 Today's Date: 01/25/2024 HPI/PMH: HPI: 58 y.o. male presented from SNF with AMS. Dx sepsis secondary to UTI with chronic suprapubic catheter, ARF, hypernatremia, uremia. MRI 9/5  (+) Acute lacunar infaracts involving L frontal, L posterior temporal/ parietal lobe and single acute lacunar infarct R frontal lobe.  Recent poor PO intake and dehydration per notes. PMHx dementia, prior CVA, depression, HLD, PVD with right AKA. Prior MBS 12/17/21- regular diet, overall weakness of  pharyngeal constriction, tongue propulsion, premature spillage. No aspiration. ST f/u for diet tolerance/potential progression and speech/language cognitive assessment. Clinical Impression: Clinical Impression Mr. Ruesch presented with functional swallowing, exhibiting consistent participation and improved response times from that which is usually demonstrated clinically. Oral phase was c/b mildly slow but functional mastication with no residue after the swallow.  Pharyngeal swallow was delayed, particularly with liquids, with material sitting in pyriform sinuses prior to swallow onset. There was no aspiration.  Pharyngeal contraction, tongue base contraction were WNL - solids passed into esophagus with no residue remaining in the back of the  throat.  Visibility was compromised by positioning/shoulder obstruction - careful review confirmed no aspiration of POs.   Focus should be on positioning to optimize independence with eating.  If eating in the recliner is feasible, every effort should be made to do so.  Mr Lardner's failure to thrive  is the primary obstacle to PO intake.  Recommend advancing to regular diet, thin liquids - I anticipate he will cough during meals, particularly if he is slumped to his left side while eating.  D/W Dr. Vann and Alicia Parker from Palliative Care. DIGEST Swallow Severity Rating*  Safety: 0  Efficiency: 0  Overall Pharyngeal Swallow Severity: 0 - normal 1: mild; 2: moderate; 3: severe; 4: profound *The Dynamic Imaging Grade of Swallowing Toxicity is standardized for the head and neck cancer population, however, demonstrates promising clinical applications across populations to standardize the clinical rating of pharyngeal swallow safety and severity. Factors that may increase risk of adverse event in presence of aspiration Noe & Lianne 2021): Factors that may increase risk of adverse event in presence of aspiration Noe & Lianne 2021): Reduced cognitive function; Limited  mobility; Poor general health and/or compromised immunity Recommendations/Plan: Swallowing Evaluation Recommendations Swallowing Evaluation Recommendations Recommendations: PO diet Medication Administration: Whole meds with puree Supervision: Staff to assist with self-feeding; Patient able to self-feed; Set-up assistance for safety Postural changes: Position pt fully upright for meals Oral care recommendations: Oral care BID (2x/day) Treatment Plan Treatment Plan Treatment recommendations: Therapy as outlined in treatment plan below Follow-up recommendations: Skilled nursing-short term rehab (<3 hours/day) Functional status assessment: Patient has had a recent decline in their functional status and demonstrates the ability to make significant improvements in function in a reasonable and predictable amount of time. Treatment frequency: Min 1x/week Treatment duration: 1 week Interventions: Patient/family education Recommendations Recommendations for follow up therapy are one component of a multi-disciplinary discharge planning process, led by the attending physician.  Recommendations may be updated based on patient status, additional functional criteria and insurance authorization. Assessment: Orofacial Exam: Orofacial Exam Oral Cavity - Dentition: Poor condition Anatomy: No data recorded Boluses Administered: Boluses Administered Boluses Administered: Thin liquids (Level 0); Mildly thick liquids (Level 2, nectar thick); Moderately thick liquids (Level 3, honey thick); Puree; Solid  Oral Impairment Domain: Oral Impairment Domain Lip Closure: No labial escape Tongue control during bolus hold: Not tested Bolus preparation/mastication: Slow prolonged chewing/mashing with complete recollection Bolus transport/lingual motion: Brisk tongue motion Initiation of pharyngeal swallow : Pyriform sinuses  Pharyngeal Impairment Domain: Pharyngeal Impairment Domain Soft palate elevation: No bolus between soft palate (SP)/pharyngeal  wall (PW) Laryngeal elevation: Complete superior movement of thyroid cartilage with complete approximation of arytenoids to epiglottic petiole Anterior hyoid excursion: Complete anterior movement Epiglottic movement: Complete inversion Laryngeal vestibule closure: Complete, no air/contrast in laryngeal vestibule Pharyngeal stripping wave : Present - complete Pharyngeal contraction (A/P view only): N/A Pharyngoesophageal segment opening: -- (difficult to visualize due to shoulder obstruction) Tongue base retraction: No contrast between tongue base and posterior pharyngeal wall (PPW) Pharyngeal residue: Complete pharyngeal clearance  Esophageal Impairment Domain: Esophageal Impairment Domain Esophageal clearance upright position: Complete clearance, esophageal coating Pill: Pill Consistency administered: Thin liquids (Level 0) Thin liquids (Level 0): [facility] Penetration/Aspiration Scale Score: Penetration/Aspiration Scale Score 1.  Material does not enter airway: Mildly thick liquids (Level 2, nectar thick); Moderately thick liquids (Level 3, honey thick); Puree; Solid; Pill 2.  Material enters airway, remains ABOVE vocal cords then ejected out: Thin liquids (Level 0) Compensatory Strategies: Compensatory Strategies Compensatory strategies: Yes Straw:  Effective   General Information: Caregiver present: No  Diet Prior to this Study: Mildly thick liquids (Level 2, nectar thick); Dysphagia 2 (finely chopped)   Temperature : Normal   Respiratory Status: WFL   Supplemental O2: None (Room air)   History of Recent Intubation: No  Behavior/Cognition: Alert Self-Feeding Abilities: Able to self-feed; Needs set-up for self-feeding Baseline vocal quality/speech: Not observed Volitional Cough: Able to elicit Volitional Swallow: Unable to elicit Exam Limitations: Poor positioning; Limited visibility Goal Planning: Prognosis for improved oropharyngeal function: Fair No data recorded No data recorded No data recorded No data recorded  Pain: Pain Assessment Pain Assessment: Faces Faces Pain Scale: 2 Breathing: 0 Negative Vocalization: 1 Facial Expression: 0 Body Language: 0 Consolability: 0 PAINAD Score: 1 Pain Location: none reported Pain Descriptors / Indicators: Grimacing Pain Intervention(s): Limited activity within patient's tolerance End of Session: Start Time:SLP Start Time (ACUTE ONLY): 9076 Stop Time: SLP Stop Time (ACUTE ONLY): 0933 Time Calculation:SLP Time Calculation (min) (ACUTE ONLY): 10 min Charges: SLP Evaluations $ SLP Speech Visit: 1 Visit SLP Evaluations $MBS Swallow: 1 Procedure SLP visit diagnosis: SLP Visit Diagnosis: Dysphagia, unspecified (R13.10) Past Medical History: Past Medical History: Diagnosis Date  Acute arterial ischemic stroke, vertebrobasilar, thalamic, left (HCC) 04/25/2020  Acute CVA (cerebrovascular accident) (HCC) 03/27/2021  BPH (benign prostatic hyperplasia)   Diabetes mellitus without complication (HCC)   HLD (hyperlipidemia)   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  Hypertension   Nicotine  dependence, cigarettes, uncomplicated 04/25/2020  Sleep apnea 01/11/2024  Stroke [facility])  Past Surgical History: Past Surgical History: Procedure Laterality Date  ABDOMINAL AORTOGRAM W/LOWER EXTREMITY N/A 02/02/2022  Procedure: ABDOMINAL AORTOGRAM W/LOWER EXTREMITY;  Surgeon: Lanis Fonda BRAVO, MD;  Location: [facility] INVASIVE CV LAB;  Service: Cardiovascular;  Laterality: N/A;  AMPUTATION Right 02/17/2022  Procedure: AMPUTATION DIGIT GREAT AND SECOND TOES;  Surgeon: Sheree Penne Bruckner, MD;  Location: [facility] OR;  Service: Vascular;  Laterality: Right;  AMPUTATION Right 03/22/2022  Procedure: AMPUTATION ABOVE KNEE;  Surgeon: Sheree Penne Bruckner, MD;  Location: [facility] OR;  Service: Vascular;  Laterality: Right;  BYPASS GRAFT FEMORAL-PERONEAL Right 02/17/2022  Procedure: Right BYPASS GRAFT GURNEY;  Surgeon: Sheree Penne Bruckner, MD;  Location: [facility] OR;  Service: Vascular;  Laterality: Right;   ENDARTERECTOMY FEMORAL Right 02/17/2022  Procedure: ENDARTERECTOMY FEMORAL RIGHT;  Surgeon: Sheree Penne Bruckner, MD;  Location: [facility] OR;  Service: Vascular;  Laterality: Right;  INSERTION OF ILIAC STENT  02/17/2022  Procedure: INSERTION OF PERONEAL STENT;  Surgeon: Sheree Penne Bruckner, MD;  Location: [facility] OR;  Service: Vascular;;  IR CATHETER TUBE CHANGE  09/19/2022  LOWER EXTREMITY ANGIOGRAM  02/17/2022  Procedure: LOWER EXTREMITY ANGIOGRAM;  Surgeon: Sheree Penne Bruckner, MD;  Location: [facility] OR;  Service: Vascular;; Alan CROME. Vona, MA CCC/SLP Clinical Specialist - Acute Care SLP Acute Rehabilitation Services Office number [phone] Vona Alan Laurice 01/25/2024, 10:17 AM  VAS US  CAROTID (at [facility] and [facility] only) Result Date: 01/18/2024 Carotid Arterial Duplex Study Patient Name:  [facility]  Date of Exam:   01/13/2024 Medical Rec #: [id number]      Accession #:    [id number] Date of Birth: [birth year]      Patient Gender: M Patient Age:   64 years Exam Location:  [facility] Procedure:      VAS US  CAROTID Referring Phys: [facility] PATEL --------------------------------------------------------------------------------  Indications:       Acute Ischemic Infarct: bilateral, left frontal posterior  temporal/parietal lobe, and acute lacunar infarct right                    frontal lobe. Risk Factors:      Hyperlipidemia, Diabetes, prior CVA (12/21 and 11/22), PAD. Other Factors:     Right AKA, dementia. Limitations        Today's exam was limited due to constant movement and                    coughing. Comparison Study:  No prior study on file. CTA of neck done 03/27/21 indicated                    approximate 50% right ICA stenosis (mostly soft plaque) and                    no left ICA stenosis. Performing Technologist: Alberta Lis RVS  Examination Guidelines: A complete evaluation includes B-mode imaging, spectral Doppler, color Doppler, and power Doppler as needed of all  accessible portions of each vessel. Bilateral testing is considered an integral part of a complete examination. Limited examinations for reoccurring indications may be performed as noted.  Right Carotid Findings: +----------+--------+--------+--------+------------------+--------+           PSV cm/sEDV cm/sStenosisPlaque DescriptionComments +----------+--------+--------+--------+------------------+--------+ CCA Prox  172     32              homogeneous                +----------+--------+--------+--------+------------------+--------+ CCA Distal165     22              heterogenous               +----------+--------+--------+--------+------------------+--------+ ICA Prox  103     30      1-39%   heterogenous               +----------+--------+--------+--------+------------------+--------+ ICA Mid   124     34                                         +----------+--------+--------+--------+------------------+--------+ ICA Distal66      18                                         +----------+--------+--------+--------+------------------+--------+ ECA       156     20                                         +----------+--------+--------+--------+------------------+--------+ +----------+--------+-------+---------+-------------------+           PSV cm/sEDV cmsDescribe Arm Pressure (mmHG) +----------+--------+-------+---------+-------------------+ Subclavian130            Turbulent                    +----------+--------+-------+---------+-------------------+ +---------+--------+--+--------+--+ VertebralPSV cm/s80EDV cm/s18 +---------+--------+--+--------+--+  Left Carotid Findings: +----------+--------+--------+--------+------------------+--------+           PSV cm/sEDV cm/sStenosisPlaque DescriptionComments +----------+--------+--------+--------+------------------+--------+ CCA Prox  113     21              heterogenous                +----------+--------+--------+--------+------------------+--------+  CCA Distal109     16              heterogenous               +----------+--------+--------+--------+------------------+--------+ ICA Prox  111     31      1-39%   heterogenous               +----------+--------+--------+--------+------------------+--------+ ICA Mid   97      30              homogeneous                +----------+--------+--------+--------+------------------+--------+ ICA Distal140     32                                         +----------+--------+--------+--------+------------------+--------+ ECA       223     13      >50%                               +----------+--------+--------+--------+------------------+--------+ +----------+--------+--------+--------+-------------------+           PSV cm/sEDV cm/sDescribeArm Pressure (mmHG) +----------+--------+--------+--------+-------------------+ [id number]             Stenotic                    +----------+--------+--------+--------+-------------------+ +---------+--------+--+--------+--+ VertebralPSV cm/s51EDV cm/s13 +---------+--------+--+--------+--+   Summary: Right Carotid: Velocities in the right ICA are consistent with a 1-39% stenosis. Left Carotid: Velocities in the left ICA are consistent with a 1-39% stenosis.               The ECA appears >50% stenosed. Vertebrals:  Bilateral vertebral arteries demonstrate antegrade flow. Subclavians: Left subclavian artery was stenotic. Right subclavian artery flow              was disturbed. *See table(s) above for measurements and observations.  Electronically signed by Eather Popp MD on 01/18/2024 at 10:24:04 AM.    Final    VAS US  ABI WITH/WO TBI Result Date: 01/17/2024  LOWER EXTREMITY DOPPLER STUDY Patient Name:  Tirth Cothron  Date of Exam:   01/17/2024 Medical Rec #: [id number]      Accession #:    [id number] Date of Birth: January 11, 1966      Patient Gender: M Patient Age:   72  years Exam Location:  [facility] Procedure:      VAS US  ABI WITH/WO TBI Referring Phys: PRANAV PATEL --------------------------------------------------------------------------------  Indications: Peripheral artery disease. right AKA High Risk Factors: Hypertension, Diabetes.  Limitations: Today's exam was limited due to patient positioning and bandages. Comparison Study: Previous study on 3.27.2024. Performing Technologist: Edilia Elden Appl  Examination Guidelines: A complete evaluation includes at minimum, Doppler waveform signals and systolic blood pressure reading at the level of bilateral brachial, anterior tibial, and posterior tibial arteries, when vessel segments are accessible. Bilateral testing is considered an integral part of a complete examination. Photoelectric Plethysmograph (PPG) waveforms and toe systolic pressure readings are included as required and additional duplex testing as needed. Limited examinations for reoccurring indications may be performed as noted.  ABI Findings: +---------+------------------+-----+---------+--------+ Right    Rt Pressure (mmHg)IndexWaveform Comment  +---------+------------------+-----+---------+--------+ Brachial 157                    triphasic         +---------+------------------+-----+---------+--------+  PTA                                      AKA      +---------+------------------+-----+---------+--------+ DP                                       AKA      +---------+------------------+-----+---------+--------+ Great Toe                                AKA      +---------+------------------+-----+---------+--------+ +---------+------------------+-----+-------------------+---------------+ Left     Lt Pressure (mmHg)IndexWaveform           Comment         +---------+------------------+-----+-------------------+---------------+ Brachial                                           IV line.         +---------+------------------+-----+-------------------+---------------+ PTA      254               1.62 dampened monophasicNoncompressible +---------+------------------+-----+-------------------+---------------+ DP       254               1.62 dampened monophasicNoncompressible +---------+------------------+-----+-------------------+---------------+ Great Toe0                 0.00 Absent                             +---------+------------------+-----+-------------------+---------------+ +-------+-----------+-----------+------------+------------+ ABI/TBIToday's ABIToday's TBIPrevious ABIPrevious TBI +-------+-----------+-----------+------------+------------+ Right  AKA        AKA                                 +-------+-----------+-----------+------------+------------+ Left   Cohoes         Absent.                             +-------+-----------+-----------+------------+------------+ Arterial wall calcification precludes accurate ankle pressures and ABIs.  Summary: Right:  Right AKA. Left: Resting left ankle-brachial index indicates noncompressible left lower extremity arteries. The left toe-brachial index is abnormal.  *See table(s) above for measurements and observations.  Electronically signed by Lonni Gaskins MD on 01/17/2024 at 4:07:57 PM.    Final    CT ABDOMEN PELVIS W CONTRAST Result Date: 01/15/2024 CLINICAL DATA:  Bowel obstruction suspected Intractable nausea and vomiting. EXAM: CT ABDOMEN AND PELVIS WITH CONTRAST TECHNIQUE: Multidetector CT imaging of the abdomen and pelvis was performed using the standard protocol following bolus administration of intravenous contrast. RADIATION DOSE REDUCTION: This exam was performed according to the departmental dose-optimization program which includes automated exposure control, adjustment of the mA and/or kV according to patient size and/or use of iterative reconstruction technique. CONTRAST:  75mL OMNIPAQUE  IOHEXOL  350 MG/ML  SOLN COMPARISON:  Same day radiographs. Abdominopelvic CT without contrast 12/15/2021 FINDINGS: Technical note: Despite efforts by the technologist and patient, mild motion artifact is present on today's exam and could not be eliminated. This reduces exam sensitivity and specificity. Lower chest: New small bilateral pleural  effusions with adjacent dependent airspace opacities, likely atelectasis. Distal esophageal wall thickening is again noted, similar to previous study. Hepatobiliary: The liver is normal in density without suspicious focal abnormality. The gallbladder is incompletely distended. No evidence of gallstones, gallbladder wall thickening or biliary dilatation. Pancreas: Unremarkable. No pancreatic ductal dilatation or surrounding inflammatory changes. Spleen: Normal in size without focal abnormality. Adrenals/Urinary Tract: Both adrenal glands appear normal. No evidence of urinary tract calculus, suspicious renal lesion or hydronephrosis. A suprapubic bladder catheter is in place. The bladder appears decompressed with possible mild wall thickening. Stomach/Bowel: No enteric contrast administered. As above, distal esophageal wall thickening, similar to previous study. The stomach appears unremarkable for its degree of distention. No evidence of bowel distension, wall thickening or surrounding inflammation. The appendix appears normal. Mildly prominent stool throughout the colon. Vascular/Lymphatic: There are no enlarged abdominal or pelvic lymph nodes. Aortic and branch vessel atherosclerosis with postsurgical changes in the right groin consistent with previous femoral bypass procedure. No evidence of aneurysm or acute large vessel occlusion. Reproductive: Moderate enlargement of the prostate gland, similar to previous study. Other: No evidence of abdominal wall mass or hernia. No ascites or pneumoperitoneum. Musculoskeletal: No acute or significant osseous findings. Mild multilevel spondylosis.  IMPRESSION: 1. No acute findings or explanation for the patient's symptoms. No evidence of bowel obstruction. 2. New small bilateral pleural effusions with adjacent dependent airspace opacities, likely atelectasis. 3. Distal esophageal wall thickening, similar to previous study, suggesting esophagitis or chronic reflux. Correlate clinically. 4. Suprapubic bladder catheter in place with possible mild bladder wall thickening. 5. Aortic Atherosclerosis (ICD10-I70.0). Interval postsurgical changes of the right groin consistent with femoral bypass procedure. Electronically Signed   By: Elsie Perone M.D.   On: 01/15/2024 16:09   DG Abd Portable 1V Result Date: 01/15/2024 CLINICAL DATA:  Intractable nausea and vomiting. EXAM: PORTABLE ABDOMEN - 1 VIEW COMPARISON:  Abdomen and pelvis CT dated 12/15/2021 FINDINGS: Gas in normal caliber stomach, small bowel and colon and into mildly dilated loops of jejunum in the left mid abdomen. Lumbar and lower thoracic spine degenerative changes. IMPRESSION: Mildly dilated loops of jejunum in the left mid abdomen. This could be due to a focal ileus or mild partial small bowel obstruction. Electronically Signed   By: Elspeth Bathe M.D.   On: 01/15/2024 09:48   DG CHEST PORT 1 VIEW Result Date: 01/13/2024 CLINICAL DATA:  Altered mental status, shortness of breath EXAM: PORTABLE CHEST 1 VIEW COMPARISON:  Chest radiograph dated 01/11/2024 FINDINGS: Normal lung volumes. No focal consolidations. No pleural effusion or pneumothorax. The heart size and mediastinal contours are within normal limits. No acute osseous abnormality. IMPRESSION: No active disease. Electronically Signed   By: Limin  Xu M.D.   On: 01/13/2024 15:05   MR ANGIO HEAD WO CONTRAST Result Date: 01/12/2024 CLINICAL DATA:  Stroke, follow up EXAM: MRA HEAD WITHOUT CONTRAST TECHNIQUE: Angiographic images of the Circle of Willis were acquired using MRA technique without intravenous contrast. COMPARISON:  Same day MRI head.   A CTA head March 27, 2021. FINDINGS: Anterior circulation: Bilateral intracranial ICAs, MCAs and ACAs are patent without proximal hemodynamically significant stenosis. Posterior circulation: Bilateral intradural vertebral arteries, basilar artery and bilateral posterior arch are patent without proximal hemodynamically significant stenosis. IMPRESSION: No large vessel occlusion or proximal hemodynamically significant stenosis. Electronically Signed   By: Gilmore GORMAN Molt M.D.   On: 01/12/2024 22:20   ECHOCARDIOGRAM COMPLETE Result Date: 01/12/2024    ECHOCARDIOGRAM REPORT   Patient Name:   Giavonni  Devaux Date of Exam: 01/12/2024 Medical Rec #:  [id number]     Height:       66.0 in Accession #:    [id number]    Weight:       198.0 lb Date of Birth:  Jun 20, 1965     BSA:          1.991 m Patient Age:    58 years      BP:           145/68 mmHg Patient Gender: M             HR:           95 bpm. Exam Location:  Inpatient Procedure: 2D Echo (Both Spectral and Color Flow Doppler were utilized during            procedure). Indications:    stroke  History:        Patient has prior history of Echocardiogram examinations, most                 recent 03/28/2021. PAD; Risk Factors:Hypertension, Dyslipidemia,                 Diabetes and Sleep Apnea.  Sonographer:    Tinnie Barefoot RDCS Referring Phys: [id number] [facility] M PATEL  Sonographer Comments: Technically difficult study due to poor echo windows. Image acquisition challenging due to uncooperative patient and Image acquisition challenging due to respiratory motion. IMPRESSIONS  1. Technically difficult study, see sonographer comments.  2. Left ventricular ejection fraction, by estimation, is >75%. The left ventricle has hyperdynamic function. The left ventricle has no regional wall motion abnormalities. Left ventricular diastolic parameters were normal.  3. Right ventricular systolic function is normal. The right ventricular size is normal. Mildly increased right  ventricular wall thickness. Tricuspid regurgitation signal is inadequate for assessing PA pressure.  4. The mitral valve is grossly normal. No evidence of mitral valve regurgitation. No evidence of mitral stenosis.  5. The aortic valve was not well visualized. Aortic valve regurgitation is not visualized. No aortic stenosis is present.  6. The inferior vena cava is normal in size with greater than 50% respiratory variability, suggesting right atrial pressure of 3 mmHg. Conclusion(s)/Recommendation(s): No intracardiac source of embolism detected on this transthoracic study. Consider a transesophageal echocardiogram to exclude cardiac source of embolism if clinically indicated. FINDINGS  Left Ventricle: Left ventricular ejection fraction, by estimation, is >75%. The left ventricle has hyperdynamic function. The left ventricle has no regional wall motion abnormalities. The left ventricular internal cavity size was normal in size. There is no left ventricular hypertrophy. Left ventricular diastolic parameters were normal. Right Ventricle: The right ventricular size is normal. Mildly increased right ventricular wall thickness. Right ventricular systolic function is normal. Tricuspid regurgitation signal is inadequate for assessing PA pressure. Left Atrium: Left atrial size was normal in size. Right Atrium: Right atrial size was normal in size. Pericardium: There is no evidence of pericardial effusion. Mitral Valve: The mitral valve is grossly normal. No evidence of mitral valve regurgitation. No evidence of mitral valve stenosis. Tricuspid Valve: The tricuspid valve is not well visualized. Tricuspid valve regurgitation is not demonstrated. No evidence of tricuspid stenosis. Aortic Valve: The aortic valve was not well visualized. Aortic valve regurgitation is not visualized. No aortic stenosis is present. Pulmonic Valve: The pulmonic valve was not well visualized. Pulmonic valve regurgitation is not visualized. Aorta: The  ascending aorta was not well visualized and the aortic root was not well visualized. Venous:  The inferior vena cava is normal in size with greater than 50% respiratory variability, suggesting right atrial pressure of 3 mmHg. IAS/Shunts: The atrial septum is grossly normal.  LEFT VENTRICLE PLAX 2D LVIDd:         4.25 cm Diastology LVIDs:         2.00 cm LV e' medial:    6.53 cm/s LV PW:         1.10 cm LV E/e' medial:  7.3 LV IVS:        1.00 cm LV e' lateral:   9.25 cm/s                        LV E/e' lateral: 5.1  RIGHT VENTRICLE             IVC RV Basal diam:  3.00 cm     IVC diam: 1.40 cm RV S prime:     15.60 cm/s TAPSE (M-mode): 2.6 cm LEFT ATRIUM           Index        RIGHT ATRIUM           Index LA diam:      3.50 cm 1.76 cm/m   RA Area:     17.10 cm LA Vol (A4C): 32.1 ml 16.12 ml/m  RA Volume:   45.00 ml  22.60 ml/m  AORTIC VALVE LVOT Vmax:   93.80 cm/s LVOT Vmean:  60.600 cm/s LVOT VTI:    0.161 m MV E velocity: 47.60 cm/s MV A velocity: 74.60 cm/s  SHUNTS MV E/A ratio:  0.64        Systemic VTI: 0.16 m Sunit Tolia Electronically signed by Madonna Large Signature Date/Time: 01/12/2024/5:58:19 PM    Final    MR BRAIN WO CONTRAST Result Date: 01/12/2024 CLINICAL DATA:  Altered mental status EXAM: MRI HEAD WITHOUT CONTRAST TECHNIQUE: Multiplanar, multiecho pulse sequences of the brain and surrounding structures were obtained without intravenous contrast. COMPARISON:  March 27, 2021 FINDINGS: MRI brain: There are multiple old infarcts including bilateral cerebellar infarcts, bilateral pontine, right midbrain and bilateral thalamic. There is an old infarct in the basal ganglia on the right. There are 3 acute lacunar infarcts in the left frontal lobe and 1 in the right frontal lobe. There are 2 acute lacunar infarcts in the left posterior temporal/parietal lobe There are multiple confluent foci of T2 hyperintensity in the cerebral white matter. These do not have restricted diffusion. The ventricles are  normal. No mass lesion. There are normal flow signals in the carotid arteries and basilar artery. No significant bone marrow signal abnormality. No significant abnormality in the paranasal sinuses or soft tissues. 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 There is an old infarct in the basal ganglia on the right with encephalomalacia. With the exception of the basal ganglia infarct, all of these have developed since the prior MRI study from 2022. There are several acute lacunar infarcts involving the left frontal lobe and left posterior temporal/parietal lobe, and a single acute lacunar infarct in the right frontal lobe. Electronically Signed   By: Nancyann Burns M.D.   On: 01/12/2024 11:35   US  RENAL Result Date: 01/11/2024 CLINICAL DATA:  AKI. EXAM: RENAL / URINARY TRACT ULTRASOUND COMPLETE COMPARISON:  Renal ultrasound dated 12/17/2021. FINDINGS: Right Kidney: Renal measurements: 10.6 x 5.9 x 4.7 cm = volume: 155 mL. Trace right perinephric fluid. Increased cortical echogenicity. No  mass or hydronephrosis visualized. Left Kidney: Renal measurements: 10.3 x 5.0 x 4.3 cm = volume: 115 mL. Increased cortical echogenicity. No mass or hydronephrosis visualized. Bladder: Bladder is decompressed with suprapubic catheter in place. Other: None. IMPRESSION: 1. Increased bilateral renal cortical echogenicity, compatible with medical renal disease. 2. Trace right perinephric fluid.  No hydronephrosis. Electronically Signed   By: Harrietta Sherry M.D.   On: 01/11/2024 18:57   CT HEAD WO CONTRAST ( ) Result Date: 01/11/2024 CLINICAL DATA:  Ataxia, head trauma Mental status change, unknown cause EXAM: CT HEAD WITHOUT CONTRAST TECHNIQUE: Contiguous axial images were obtained from the base of the skull through the vertex without intravenous contrast. RADIATION DOSE REDUCTION: This exam was performed according to the departmental  dose-optimization program which includes automated exposure control, adjustment of the mA and/or kV according to patient size and/or use of iterative reconstruction technique. COMPARISON:  04/08/2022 FINDINGS: Brain: No evidence of acute infarction, hemorrhage, hydrocephalus, extra-axial collection or mass lesion/mass effect. Stable atrophy, advanced for age. Periventricular chronic small vessel ischemic changes are stable. Remote lacunar infarcts in the bilateral basal ganglia, more so on the right. Vascular: Atherosclerosis of skullbase vasculature without hyperdense vessel or abnormal calcification. Skull: No fracture or focal lesion. Sinuses/Orbits: Mild mucosal thickening of the paranasal sinuses. The mastoid air cells are clear. Unremarkable appearance of the orbits. Other: None. IMPRESSION: 1. No acute intracranial abnormality. 2. Stable atrophy and chronic small vessel ischemic changes. Remote lacunar infarcts in the bilateral basal ganglia. Electronically Signed   By: Andrea Gasman M.D.   On: 01/11/2024 18:30   DG Chest Port 1 View Result Date: 01/11/2024 CLINICAL DATA:  Sepsis EXAM: PORTABLE CHEST 1 VIEW COMPARISON:  March 25, 2024 FINDINGS: Low lung volumes. No focal consolidation. No pleural effusions. No pneumothorax. Unchanged cardiomediastinal silhouette.  No acute osseous findings. IMPRESSION: Hypoinflation.  No acute findings. Electronically Signed   By: Michaeline Blanch M.D.   On: 01/11/2024 15:54    Time coordinating discharge: 60 mins  SIGNED:  Camellia Door, DO [facility] 02/06/24, 11:18 AM

## 2024-02-06 NOTE — Progress Notes (Addendum)
 PROGRESS NOTE    Ryan Hughes  FMW:[id number] DOB: Apr 29, 1966 DOA: 01/11/2024 PCP: [name], MD  Subjective: Pt seen and examined. First time pt has been verbal all week. He knows that he is at [facility]. Thought it was 2002. Did not know the month. Was able to speak today. Saying he was doing alright.  SW states pt has bed offer at LTC facility at [facility] Course: CC: confusion. Per EMS, Pt, from [facility], presents d/t increasing AMS and sweating x4 days.  Pt noted to be hypotensive.  Pt has a suprapubic catheter. Family reports Pt normally gets around well in a wheelchair and is talkative.   HPI: 58 yo AAM with PMH of dementia, depression, CVA, HLD, type II DM, PVD with right AKA, wheelchair-bound, BPH with suprapubic catheter present to the hospital with complaints of altered mental status.  History was obtained from family was at bedside.  Patient is unable to provide history secondary to confusion and unable to reach staff at the [facility].  Per EMS reportedly patient had confusion progressively worsening for last 4 days.  Per niece 2 days ago patient stopped taking his medications.  As the patient progressively decline [facility] staff sent the patient to hospital for further evaluation. Patient at his baseline is in wheelchair, normally carries out a conversation with clear speech but confused thoughts due to his dementia.  Patient pushes his wheelchair himself per niece. Patient has history of slipping out of his bed when family is visiting him.  And reportedly had a fall 2 weeks ago. No recent change in medications reported by the family. Per RN his suprapubic catheter was with significant sediments.  Significant Events: Admitted 01/11/2024 for acute hypernatremia, AKI 01-11-2024 until 9-10-20254 Acute hypernatremia, uremia  and ARF resolved with IVF. Na of 143, BUN of 16, Scr  1.08. pt UTI due to indwelling suprapubic tube treated with IV unasyn . Stopped on 01-17-2024 01-12-2024 to 01-14-2024  MRI brain shows acute lacunar strokes. Neurology consulted. Cva work up proceeds. Echo normal. Carotid dopplers minimal stenosis. Final recs are:aspirin  81 mg daily and Brilinta  (ticagrelor ) 90 mg bid for 4 weeks and then aspirin  alone  01-17-2024 GI consulted due to melena and anemia. Pt transfused 1 unit PRBC. GI felt pt was as poor candidate for endoscopy procedure. 01-18-2024 palliative care consulted for GOC Week of 01-17-2024 through 01-23-2024 GI bleeding stops after Brilinta  held.  Pt's niece [name] wanted Brilinta  to be restarted(01-20-2024) Week of 01-24-2024 through 01-30-2024. Pt refusing to take meds as well as po food/liquids. He is combatative with nursing staff who are providing basic care including changing bed linens and cleaning around his suprapubic tube Week of 01-31-2024 through 02-06-2024. Pt intermittently takes his meds. Palliative care discussed with family. Pt made DNR/DNI. SW able to locate SNF for LTC  Admission Labs: Na 162, K 4.5, CO2 of 19, BUN 214, Scr 4.0 T prot 6.5, alb 3.2, AST 19, ALT 22, alk phos 74, t. Bili 0.5 WBC 16.9, HgB 9.5, plt 301 Covid/rsv/flu negative VBG pH 7.29, PCO2 of 44 HIV non-reactive TSH 0.892, FT4 1.08 NH3 of 17  Admission Imaging Studies: CXR Hypoinflation. No acute findings.  CT head No acute intracranial abnormality. 2. Stable atrophy and chronic small vessel ischemic changes. Remote lacunar infarcts in the bilateral basal ganglia Renal U/S Increased bilateral renal cortical echogenicity, compatible with medical renal disease. 2. Trace right perinephric fluid.  No hydronephrosis   Significant Labs: 01-11-2024 Vit b12 of 1115, folate > 20, vit B1 of 136 01-13-2024 A1c of 6.1%  Significant Imaging Studies: 01-12-2024 MRI Brain There are multiple old lacunar infarcts including bilateral cerebellar, bilateral pontine, right  midbrain, bilateral thalamic. There is an old infarct in the basal ganglia on the right with encephalomalacia. With the exception of the basal ganglia infarct, all of these have developed since the prior MRI study from 2022. There are several acute lacunar infarcts involving the left frontal lobe and left posterior temporal/parietal lobe, and a single acute lacunar infarct in the right frontal lobe 01-12-2024 MRA head No large vessel occlusion or proximal hemodynamically significant stenosis 01-15-2024 abd xr Mildly dilated loops of jejunum in the left mid abdomen. This could be due to a focal ileus or mild partial small bowel obstructio 01-15-2024 CT abd/pelvis No acute findings or explanation for the patient's symptoms. No evidence of bowel obstruction. 2. New small bilateral pleural effusions with adjacent dependent airspace opacities, likely atelectasis. 3. Distal esophageal wall thickening, similar to previous study, suggesting esophagitis or chronic reflux. Correlate clinically. 4. Suprapubic bladder catheter in place with possible mild bladder wall thickening. 5. Aortic Atherosclerosis (ICD10-I70.0). Interval postsurgical changes of the right groin consistent with femoral bypass procedure.  Antibiotic Therapy: Anti-infectives (From admission, onward)    Start     Dose/Rate Route Frequency Ordered Stop   01/13/24 1515  Ampicillin -Sulbactam (UNASYN ) 3 g in sodium chloride  0.9 % 100 mL IVPB        3 g 200 mL/hr over 30 Minutes Intravenous Every 6 hours 01/13/24 1430 01/17/24 2209   01/12/24 1200  piperacillin -tazobactam (ZOSYN ) IVPB 3.375 g  Status:  Discontinued        3.375 g 12.5 mL/hr over 240 Minutes Intravenous Every 8 hours 01/12/24 0737 01/13/24 1102   01/12/24 1000  linezolid  (ZYVOX ) IVPB 600 mg  Status:  Discontinued        600 mg 300 mL/hr over 60 Minutes Intravenous Every 12 hours 01/11/24 1659 01/12/24 1448   01/12/24 0600  piperacillin -tazobactam (ZOSYN ) IVPB 2.25 g  Status:   Discontinued        2.25 g 100 mL/hr over 30 Minutes Intravenous Every 8 hours 01/11/24 1746 01/12/24 0737   01/11/24 2200  linezolid  (ZYVOX ) IVPB 600 mg  Status:  Discontinued        600 mg 300 mL/hr over 60 Minutes Intravenous Every 12 hours 01/11/24 1659 01/11/24 1659   01/11/24 1415  ceFEPIme  (MAXIPIME ) 2 g in sodium chloride  0.9 % 100 mL IVPB        2 g 200 mL/hr over 30 Minutes Intravenous  Once 01/11/24 1402 01/11/24 1532   01/11/24 1415  metroNIDAZOLE  (FLAGYL ) IVPB 500 mg        500 mg 100 mL/hr over 60 Minutes Intravenous  Once 01/11/24 1402 01/11/24 1647   01/11/24 1415  vancomycin  (VANCOCIN ) IVPB 1000 mg/200 mL premix  Status:  Discontinued        1,000 mg 200 mL/hr over 60 Minutes Intravenous  Once 01/11/24 1402 01/11/24 1406   01/11/24 1415  vancomycin  (VANCOREADY) IVPB 1500 mg/300 mL        1,500 mg 150 mL/hr over 120 Minutes Intravenous  Once 01/11/24 1407 01/11/24 1854       Procedures:   Consultants: Palliative care Neurology GI    Assessment and Plan: * Acute renal failure (ARF)-resolved as of 01/31/2024 01-11-2024 to 01-17-2024. admitted for ARF/uremia with a BUN of 214,  Scr 4.0. pt given IVF over 5 days. By 9-10-20254 Acute hypernatremia, uremia  and ARF resolved. By 9-10-205 Na of 143, BUN of 16, Scr 1.08.   Goals of care, counseling/discussion 01-31-2024 today, palliative care successful in getting pt's niece to agree to changing pt's code status to DNR/DNI. CM/TOC attempting SNF placement.  02/02/24 still awaiting SNF placement.  02/03/24 awaiting SNF placement  02/04/24 awaiting SNF placement.  02/05/24 awaiting SNF placement  02/06/24 SW has secured LTC with [facility]. Stable for DC today.   UTI (urinary tract infection) due to urinary indwelling catheter (suprapubic tube)-resolved as of 01/31/2024 01-11-2024 through 01-17-2024. urine cx grew proteus, e. Coli and enterococcus.  Pt treated until 01-17-2024  with IV abx. Resolved.  Sepsis with acute renal failure without septic shock (HCC)-resolved as of 01/31/2024 01-11-2024 present on admission. WBC 16.9, Scr 4.0. UA showed signs of infection  01-11-2024 through 01-17-2024. urine cx grew proteus, e. Coli and enterococcus.  Pt treated until 01-17-2024 with IV abx.  Lacunar infarct, acute (HCC)-resolved as of 01/31/2024 01-12-2024 MRI brain, MRA head that was ordered on admission showed acute lacunar infarcts. On top of his baseline old strokes of bilateral cerebellar, bilateral pontine, right midbrain, bilateral thalamic.  He has old infarct in the right basal ganglia with encephalomalacia. Neurology was consulted.  01-14-2024. He underwent further workup by neurology including carotid dopplers and echo. Final recs from neurology were: aspirin  81 mg daily and Brilinta  (ticagrelor ) 90 mg bid for 4 weeks(end date February 11, 2024) and then aspirin  alone. Neurology signs off  Normal anion gap metabolic acidosis-resolved as of 01/31/2024 01-11-2024 to 01-17-2024 pt given IVF. Acute metabolic acidosis resolved with IVF hydration.  Uremia due to inadequate renal perfusion-resolved as of 01/31/2024 01-11-2024 to 01-17-2024. admitted for ARF/uremia with a BUN of 214, Scr 4.0. pt given IVF over 5 days. By 9-10-20254 Acute hypernatremia, uremia  and ARF resolved. By 9-10-205 Na of 143, BUN of 16, Scr 1.08.   Acute hypernatremia-resolved as of 01/31/2024 01-11-2024 to 01-17-2024. admitted for ARF/uremia with a BUN of 214, Scr 4.0. pt given IVF over 5 days. By 9-10-20254 Acute hypernatremia, uremia  and ARF resolved. By 9-10-205 Na of 143, BUN of 16, Scr 1.08.   Malnutrition of moderate degree Nutrition Status: Nutrition Problem: Moderate Malnutrition Etiology: chronic illness (dementia) Signs/Symptoms: mild fat depletion, mild muscle depletion Interventions: Nepro shake    Pressure injury of skin Present on admission. Wound 01/11/24 1900 Pressure Injury  Buttocks Left Stage 3 -  Full thickness tissue loss. Subcutaneous fat may be visible but bone, tendon or muscle are NOT exposed. (Active)   01-17-2024   02-05-2024     Hx of AKA (above knee amputation), right chronic  Depression 01/31/24 paxil  and remeron  are ordered but pt refusing to take meds.  02/01/24 stable. intermittently takes his meds    02/02/24 stable. Took most of his AM meds yesterday  02/03/24 stable  02/04/24 stable  02/05/24 stable  02/06/24 continue paxil  10 mg qam, remeron  15 mg at bedtime.   Dementia without behavioral disturbance (HCC) 01/31/24 paxil  and remeron  are ordered but pt refusing to take meds.  02/01/24 stable. intermittently takes his meds   02/02/24 stable. Took most of his AM meds yesterday  02/03/24 stable.  02/04/24 stable  02/05/24 stable. Still refusing meds  02/06/24 continue paxil  10 mg qam, remeron  15 mg at bedtime.   Chronic suprapubic catheter (HCC) Chronic. Unknown when it was initially placed, who  placed it.  02/01/24 stable.  02/02/24 stable  02/03/24 stable. Yellow urine in catheter bag  02/04/24 stable. Yellow urine in bag  02/05/24 stable  02/06/24 continue with chronic suprapubic tube. Change every 2 months. He can go to urology office to have it changed out.   PAD (peripheral artery disease) 01/31/24 stable. ASA ordered.   02/01/24 stable.  02/02/24 stable.  02/03/24 stable  02/04/24 stable  02/05/24 stable  02/06/24 continue lipitor  80 mg daily, asa 81 mg daily, brilinta  90 mg bid.   Type 2 diabetes mellitus with vascular disease (HCC) 01/31/24 on SSI. Intermittently allow nursing staff to give him insulin  injections. CBG are mostly controlled.  02/01/24 intermittently takes his meds  CBG controlled for the most part  02/02/24 CBG stable  02/03/24 CBG stable.   02/04/24 stable  02/05/24 stable  02/06/24 continue SSI at SNF.   Essential hypertension 01/31/24 norvasc  and coreg   ordered. Pt refusing to take some meds.  02/01/24 intermittently takes his meds. BP elevated due to pt inconsistently taking HTN meds  02/02/24 stable. Took most of his AM meds yesterday  02/03/24 stable  02/04/24 took about 1/2 of his meds yesterday. BP stable  02/05/24 BP elevated yesterday due to continued non-compliance of his HTN meds. Pt refusing to take about 1/2 of his meds. Will add clonidine  patch 0.2 mg so that he will ask least get something for HTN control all the time.  02/06/24 continue with clonidine  patch since pt is non-compliant most of the time with oral meds.    DNR (do not resuscitate)/DNI(Do Not Intubate) 01-31-2024 palliative care discussed with pt's niece/HCPOA [name]. Pt was made DNR/DNI.     UGI bleed-resolved as of 01/31/2024 01-17-2024 through 01-20-2024  GI consulted due to melena and anemia. Pt transfused 1 unit PRBC. GI felt pt was as poor candidate for endoscopy procedure. Brilinta  held. By 01-20-2024, GI bleeding had stopped. Pt's niece wanted Brilinta  to be restarted(01-20-2024)  Unwitnessed fall-resolved as of 01/31/2024 01-11-2024 reportedly fell at [facility]. Unable to confirm at time of admission that this really happened.  Acute metabolic encephalopathy-resolved as of 01/31/2024 01-11-2024 until 01-17-2024 due to UTI, Sepsis, CVA, hypernatremia. Resolved.  Sepsis secondary to UTI due to chronic indwelling catheter-resolved as of 01/31/2024 01-11-2024 present on admission. WBC 16.9, Scr 4.0. UA showed signs of infection   01-11-2024 through 01-17-2024. urine cx grew proteus, e. Coli and enterococcus.  Pt treated until 01-17-2024 with IV abx.  Elevated troponin-resolved as of 01/31/2024 01-12-2024 was felt not to be due to NSTEMI or demand ischemia but rather due to profound renal failure, dehydration. Resolved.   DVT prophylaxis:   ASA, brilinta    Code Status: Limited: Do not attempt resuscitation (DNR) -DNR-LIMITED -Do Not Intubate/DNI  Family  Communication: no family at bedside. Have not seen any family in 7 days Disposition Plan: SNF for LTC Reason for continuing need for hospitalization: medically stable.  Objective: Vitals:   02/05/24 1654 02/05/24 1943 02/06/24 0500 02/06/24 0726  BP: (!) 163/83 107/75  (!) 141/80  Pulse: 68 66  72  Resp: 18 17  16   Temp:  99 F (37.2 C)  97.8 F (36.6 C)  TempSrc:  Oral Other (Comment) Oral  SpO2: 100% 99%  100%  Weight:      Height:        Intake/Output Summary (Last 24 hours) at 02/06/2024 1059 Last data filed at 02/06/2024 1047 Gross per 24 hour  Intake 513 ml  Output 725 ml  Net -212  ml   Filed Weights   01/11/24 1411  Weight: 89.8 kg    Examination:  Physical Exam Vitals and nursing note reviewed.  Constitutional:      Comments: Awake this AM. Was verbal this AM. [place] he was doing okay when he was asked.  HENT:     Head: Normocephalic and atraumatic.  Eyes:     General: No scleral icterus. Cardiovascular:     Rate and Rhythm: Normal rate.  Pulmonary:     Effort: Pulmonary effort is normal.     Breath sounds: Normal breath sounds.  Musculoskeletal:     Comments: Right AKA Left foot in prevelon boot  Skin:    General: Skin is warm and dry.     Capillary Refill: Capillary refill takes less than 2 seconds.  Neurological:     Mental Status: He is disoriented.     Comments: Awake and oriented to place. Knew he was at [facility]. Incorrect [phone]). Did not know the month.     CBG: Recent Labs  Lab 02/05/24 1146 02/05/24 1219 02/05/24 1654 02/05/24 2237 02/06/24 0807  GLUCAP 115* 100* 101* 143* 110*   Radiology Studies: DG Chest Port 1 View Result Date: 02/05/2024 CLINICAL DATA:  Shortness of breath EXAM: PORTABLE CHEST 1 VIEW COMPARISON:  01/13/2024 FINDINGS: Heart and mediastinal contours are within normal limits. No focal opacities or effusions. No acute bony abnormality. IMPRESSION: No active disease. Electronically Signed   By: Franky Crease M.D.   On: 02/05/2024 00:33    Scheduled Meds:  amLODipine   5 mg Oral Daily   aspirin  EC  81 mg Oral Daily   atorvastatin   80 mg Oral QHS   carvedilol   12.5 mg Oral BID WC   cloNIDine   0.2 mg Transdermal Weekly   dutasteride   0.5 mg Oral Daily   feeding supplement (NEPRO CARB STEADY)  237 mL Oral TID BM   insulin  aspart  0-15 Units Subcutaneous TID WC   insulin  aspart  0-5 Units Subcutaneous QHS   leptospermum manuka honey  1 Application Topical Daily   mirtazapine   15 mg Oral QHS   mouth rinse  15 mL Mouth Rinse 4 times per day   pantoprazole   40 mg Oral BID   PARoxetine   10 mg Oral Daily   sucralfate   1 g Oral TID WC & HS   tamsulosin   0.8 mg Oral Daily   ticagrelor   90 mg Oral BID   Continuous Infusions:   LOS: 26 days   Time spent: 50 minutes  Camellia Door, DO  [facility]  02/06/2024, 10:59 AM

## 2024-02-06 NOTE — TOC Transition Note (Signed)
 Transition of Care [facility]) - Discharge Note   Patient Details  Name: Ryan Hughes MRN: [id number] Date of Birth: 15-Apr-1966  [facility] CM/SW Contact:  Sherline Clack, LCSWA Phone Number: 02/06/2024, 1:21 PM   Clinical Narrative:     Patient will DC to: [facility] Anticipated DC date: 02/06/24  Family notified: Niece Transport by: [place]   Per MD patient ready for DC to [facility] RN to call report prior to discharge [phone], Room b15-2). RN, patient, patient's family, and facility notified of DC. Discharge Summary and FL2 sent to facility. DC packet on chart. Ambulance transport requested for patient.   CSW will sign off for now as social work intervention is no longer needed. Please consult us  again if new needs arise.    Final next level of care: Skilled Nursing Facility (Long Term) Barriers to Discharge: Barriers Resolved   Patient Goals and CMS Choice     Choice offered to / list presented to : [facility] POA / Guardian, Patient      Discharge Placement                Patient to be transferred to facility by: [facility] to [facility] Name of family member notified: [name]/Niece Patient and family notified of of transfer: 02/06/24  Discharge Plan and Services Additional resources added to the After Visit Summary for       Post Acute Care Choice: Skilled Nursing Facility                               Social Drivers of Health (SDOH) Interventions SDOH Screenings   Food Insecurity: Patient Unable To Answer (01/11/2024)  Housing: Unknown (01/11/2024)  Transportation Needs: Patient Unable To Answer (01/11/2024)  Utilities: Patient Unable To Answer (01/11/2024)  Depression (PHQ2-9): High Risk (12/08/2021)  Tobacco Use: Medium Risk (01/11/2024)     Readmission Risk Interventions     No data to display

## 2024-02-06 NOTE — Progress Notes (Signed)
 Pt refusing meds this A.M. Made MD aware of refusal of meds. Stated there was no need to inform when pt refusing meds.

## 2024-02-06 NOTE — Assessment & Plan Note (Signed)
 01-31-2024 palliative care discussed with pt's niece/HCPOA [name]. Pt was made DNR/DNI.

## 2024-02-06 NOTE — Progress Notes (Signed)
 Palliative:  HPI: 58 y.o. male with past medical history of dementia, CVA w/ L hemiplegia and aphasia, HLD, HTN, DM2, PVD with right AKA, BPH with suprapubic catheter admitted from [facility] on 01/11/2024 with increasing AMS and sweating. Pt found with acute renal failure, hypernatremia, metabolic acidosis with normal anion gap, sepsis secondary to UTI with suprapubic catheter, acute metabolic encephalopathy. MRI (+) Acute lacunar infaracts involving L frontal, L posterior temporal/ parietal lobe and single acute lacunar infarct R frontal lobe.   I met today at Ms. Rabago's bedside. RN is at bedside. Mr. Stith is awake and tracking. RN reports no issues this morning. He is eating more at times but this fluctuates greatly. He is pleasantly confused.   I discussed further with CSW and CMRN. Plans for transition to LTC facility. I reiterated desire for hospice services at facility per my conversation with HCPOA. CSW to contact facility and discuss and make aware of recommendation.   Exam: Alert, less verbal today (this fluctuates). No distress. Breathing regular, unlabored. Abd soft. R AKA.    Plan: - DNR - LTC with hospice  25 min  Bernarda Kitty, NP Palliative Medicine Team Pager [phone] (Please see [email or web address] for schedule) Team Phone [phone]

## 2024-02-06 NOTE — Progress Notes (Signed)
Pt refused meds and vitals

## 2024-02-06 NOTE — Progress Notes (Signed)
Report called to [facility]. ?

## 2024-02-06 NOTE — Progress Notes (Signed)
 Pt refused meds. Pt also being inappropriate with staff. Pt educated on the importance of taking his meds.

## 2024-05-28 ENCOUNTER — Inpatient Hospital Stay: Admitting: Neurology

## 2024-07-10 ENCOUNTER — Inpatient Hospital Stay: Admitting: Neurology
